# Patient Record
Sex: Male | Born: 1945 | Race: Black or African American | Hispanic: No | Smoking: Current some day smoker
Health system: Southern US, Community
[De-identification: ages and names within clinical notes are randomized; demographics above are authoritative.]

## PROBLEM LIST (undated history)

## (undated) DIAGNOSIS — I639 Cerebral infarction, unspecified: Secondary | ICD-10-CM

## (undated) DIAGNOSIS — N289 Disorder of kidney and ureter, unspecified: Secondary | ICD-10-CM

## (undated) DIAGNOSIS — E785 Hyperlipidemia, unspecified: Secondary | ICD-10-CM

## (undated) DIAGNOSIS — C3492 Malignant neoplasm of unspecified part of left bronchus or lung: Secondary | ICD-10-CM

## (undated) DIAGNOSIS — R011 Cardiac murmur, unspecified: Secondary | ICD-10-CM

## (undated) DIAGNOSIS — M109 Gout, unspecified: Secondary | ICD-10-CM

## (undated) DIAGNOSIS — C61 Malignant neoplasm of prostate: Secondary | ICD-10-CM

## (undated) DIAGNOSIS — F329 Major depressive disorder, single episode, unspecified: Secondary | ICD-10-CM

## (undated) DIAGNOSIS — N189 Chronic kidney disease, unspecified: Secondary | ICD-10-CM

## (undated) DIAGNOSIS — F32A Depression, unspecified: Secondary | ICD-10-CM

## (undated) DIAGNOSIS — E119 Type 2 diabetes mellitus without complications: Secondary | ICD-10-CM

## (undated) DIAGNOSIS — N19 Unspecified kidney failure: Secondary | ICD-10-CM

## (undated) DIAGNOSIS — H409 Unspecified glaucoma: Secondary | ICD-10-CM

## (undated) DIAGNOSIS — I10 Essential (primary) hypertension: Secondary | ICD-10-CM

## (undated) HISTORY — DX: Malignant neoplasm of unspecified part of left bronchus or lung: C34.92

## (undated) HISTORY — PX: CHOLECYSTECTOMY: SHX55

## (undated) HISTORY — PX: INSERTION PROSTATE RADIATION SEED: SUR718

## (undated) HISTORY — PX: APPENDECTOMY: SHX54

---

## 2004-04-02 ENCOUNTER — Ambulatory Visit: Payer: Self-pay | Admitting: Internal Medicine

## 2004-12-22 ENCOUNTER — Inpatient Hospital Stay: Payer: Self-pay

## 2006-03-17 ENCOUNTER — Emergency Department: Payer: Self-pay | Admitting: Emergency Medicine

## 2007-02-24 ENCOUNTER — Ambulatory Visit: Payer: Self-pay | Admitting: Nephrology

## 2007-03-08 ENCOUNTER — Inpatient Hospital Stay: Payer: Self-pay | Admitting: Internal Medicine

## 2007-03-08 ENCOUNTER — Other Ambulatory Visit: Payer: Self-pay

## 2007-10-12 ENCOUNTER — Emergency Department: Payer: Self-pay | Admitting: Internal Medicine

## 2008-10-12 ENCOUNTER — Emergency Department: Payer: Self-pay | Admitting: Emergency Medicine

## 2009-11-05 ENCOUNTER — Ambulatory Visit: Payer: Self-pay | Admitting: Specialist

## 2011-04-11 ENCOUNTER — Ambulatory Visit: Payer: Self-pay | Admitting: Urology

## 2011-04-16 ENCOUNTER — Ambulatory Visit: Payer: Self-pay | Admitting: Urology

## 2011-04-18 LAB — PATHOLOGY REPORT

## 2011-05-14 ENCOUNTER — Ambulatory Visit: Payer: Self-pay | Admitting: Urology

## 2011-06-26 ENCOUNTER — Ambulatory Visit: Payer: Self-pay | Admitting: Urology

## 2011-06-30 ENCOUNTER — Ambulatory Visit: Payer: Self-pay | Admitting: Radiation Oncology

## 2011-07-09 ENCOUNTER — Ambulatory Visit: Payer: Self-pay | Admitting: Radiation Oncology

## 2011-08-01 ENCOUNTER — Ambulatory Visit: Payer: Self-pay | Admitting: Radiation Oncology

## 2011-08-18 ENCOUNTER — Ambulatory Visit: Payer: Self-pay | Admitting: Radiation Oncology

## 2011-08-18 LAB — HEMOGLOBIN: HGB: 13.6 g/dL (ref 13.0–18.0)

## 2011-08-18 LAB — BASIC METABOLIC PANEL
Anion Gap: 10 (ref 7–16)
Calcium, Total: 9 mg/dL (ref 8.5–10.1)
Chloride: 108 mmol/L — ABNORMAL HIGH (ref 98–107)
Co2: 23 mmol/L (ref 21–32)
Creatinine: 2.1 mg/dL — ABNORMAL HIGH (ref 0.60–1.30)
EGFR (African American): 41 — ABNORMAL LOW
Osmolality: 287 (ref 275–301)
Potassium: 4.5 mmol/L (ref 3.5–5.1)
Sodium: 141 mmol/L (ref 136–145)

## 2011-08-27 ENCOUNTER — Ambulatory Visit: Payer: Self-pay | Admitting: Urology

## 2011-09-01 ENCOUNTER — Ambulatory Visit: Payer: Self-pay | Admitting: Radiation Oncology

## 2011-10-01 ENCOUNTER — Ambulatory Visit: Payer: Self-pay | Admitting: Radiation Oncology

## 2012-02-13 ENCOUNTER — Ambulatory Visit: Payer: Self-pay | Admitting: Radiation Oncology

## 2012-03-02 ENCOUNTER — Ambulatory Visit: Payer: Self-pay | Admitting: Radiation Oncology

## 2012-07-16 ENCOUNTER — Ambulatory Visit: Payer: Self-pay | Admitting: Radiation Oncology

## 2014-03-13 ENCOUNTER — Emergency Department: Payer: Self-pay | Admitting: Emergency Medicine

## 2014-06-09 ENCOUNTER — Ambulatory Visit: Payer: Self-pay | Admitting: Radiation Oncology

## 2014-07-14 ENCOUNTER — Ambulatory Visit: Payer: Self-pay | Admitting: Radiation Oncology

## 2014-08-01 ENCOUNTER — Ambulatory Visit
Admit: 2014-08-01 | Disposition: A | Discharge status: Home / Self Care | Payer: Self-pay | Attending: Radiation Oncology | Admitting: Radiation Oncology

## 2014-08-02 ENCOUNTER — Ambulatory Visit: Payer: Self-pay | Admitting: Radiation Oncology

## 2014-08-07 ENCOUNTER — Ambulatory Visit
Admit: 2014-08-07 | Disposition: A | Discharge status: Home / Self Care | Payer: Self-pay | Attending: Radiation Oncology | Admitting: Radiation Oncology

## 2014-09-01 ENCOUNTER — Ambulatory Visit
Admit: 2014-09-01 | Disposition: A | Discharge status: Home / Self Care | Payer: Self-pay | Attending: Radiation Oncology | Admitting: Radiation Oncology

## 2014-09-24 NOTE — H&P (Signed)
PATIENT NAME:  Jose [name], Jose [name] MR#:  [id number] DATE OF BIRTH:  July 27, 1945  DATE OF ADMISSION:  08/27/2011  HISTORY OF PRESENT ILLNESS: The patient is a 69 year old male with chronic renal failure who originally presented with a PSA of 35 in 2006. Multiple biopsies were performed, all negative for disease. Recently he had been seen by Dr. Bernardo Heater. 24 cores were obtained on 05/16/2011 showing right-sided Gleason adenocarcinoma, Gleason score of 7 (4 + 3). He underwent bone scan which showed no evidence to suggest metastatic disease. He has some urinary frequency and urgency. The patient and his wife are extremely limited in their transportation options. He was consulted in my office 07/09/2011 and the patient opted for I-125 interstitial implant. The patient is doing well today. He is seen for history and physical as well as volume study to determine seed placement using BrachyVision treatment planning to deliver 145 Gy to his prostatic volume.   PAST MEDICAL HISTORY:  1. Prostate cancer.  2. Mini stroke x2 with some speech issues involved.  3. Gout.  4. Sleep apnea/CPAP.  5. Chronic renal disease, stage III.  6. Transient ischemic attack. 7. Mild dementia.  8. Diabetes mellitus type 2.  9. Depression.  10. Glaucoma.  11. Chronic renal failure, as described above.  12. Hypertension.   FAMILY HISTORY: Positive for renal cell carcinoma, depression, cerebrovascular accident, hypertension, and hypercholesteremia.   SOCIAL HISTORY: Greater than 40 pack-year smoking history, previous history of EtOH abuse.   ALLERGIES:  1. Hydrochlorothiazide. 2. Amoxicillin causes swelling.  3. Penicillin causes swelling.  4. Ampicillin.   HOME MEDICATIONS:  1. Hydralazine 25 mg oral tablets 1 tablet orally four times daily. 2. Aspirin 325 mg oral tablet 1 tablet once a day. I asked the patient to discontinue this at least two weeks prior to procedure.  3. Clonidine 0.2 mg oral tablet 1 tablet orally three  times daily. 4. Allopurinol 100 mg oral tablet 1 tablet orally once a day in the morning.  5. Escitalopram 10 mg oral tablet 1 tablet orally every other day.  6. Glipizide 5 mg oral tablet 1 tablet orally once a day.  7. Amlodipine 5 mg oral tablet 1 tablet orally once a day.  8. Sodium polystyrene sulfonate 15 mg/60 mL one tablespoon orally once a day.  9. Ophthalmic drops 0.5% one drop to both eyes at bedtime.   REVIEW OF SYSTEMS: Unchanged from prior examination dated 07/09/2011.   PHYSICAL EXAMINATION:   VITAL SIGNS: Vital signs are stable. Temperature 98.3, pulse 60, respirations 20 per minute, blood pressure 170/76.   GENERAL: Well-developed, well-nourished male with slight speech impediment.   HEENT: Pupils are equal, round and reactive to light and accommodation. Extraocular movements intact. Disks not visualized.   NECK: Clear without evidence of cervical or supraclavicular adenopathy.   LUNGS: Clear to auscultation and percussion.   HEART: Regular rate and rhythm without murmur, rub, or thrill.   ABDOMEN: Benign with no organomegaly or masses noted.   RECTAL: Examination was not performed today.   LYMPH: No peripheral adenopathy or edema is identified.   NEURO: Cranial nerves II through XII are grossly intact. Motor, sensory, and deep tendon reflex levels are equal and symmetric in the upper and lower extremities.   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    PLAN: At this time, the patient will have volume study to determine  prostate volume size and to perform BrachyVision treatment planning to determine seed placement. The risks and benefits of seed implantation have been discussed with the patient and his wife and both seem to comprehend our treatment plan well. Consent was signed. The patient is scheduled in the next several weeks for his  implant. He is cleared at this point to go ahead with implant scheduling.   I would like to take this opportunity to thank you for allowing me to continue to participate in this patient's care.  ____________________________ Armstead Peaks, MD gsc:slb D: 07/31/2011 09:13:54 ET T: 07/31/2011 09:39:51 ET JOB#: [id number]  cc: Armstead Peaks, MD, <Dictator> Scott C. Bernardo Heater, MD Irven Easterly. Kary Kos, [place] MD ELECTRONICALLY SIGNED 08/08/2011 9:46

## 2014-09-24 NOTE — Consult Note (Signed)
Reason for Visit: This 69 year old Male patient presents to the clinic for initial evaluation of  Prostate cancer .   Referred by Dr. Bernardo Heater.  Diagnosis:   Chief Complaint/Diagnosis   69 year old male with clinical stage IIa (T1 C. N0 M0) Gleason 7 (4+3) adenocarcinoma prostate presenting with a PSA of 57   Pathology Report Pathology report reviewed    Imaging Report Bone scan reviewed    Referral Report Clinical notes reviewed    Planned Treatment Regimen I-125 interstitial implant    HPI   patient is a 69 year old male with chronic renal failure who was originally presented with a PSA back in 2006 of 35. Multiple biopsies were performed by urology all negative for disease. He recently was seen by Dr. Bernardo Heater and had 24 cores obtained on 05/16/11 showing right-sided Gleason adenocarcinoma of 7 (4+3). He underwent a bone scan which essentially unremarkable for metastatic disease. He has early symptoms as far as urinary frequency or urgency. Patient and his wife do not on the car making transportation extremely difficult for prolonged or protracted course of treatment. He is seen today for consideration of treatment.  Past Hx:    Prostate Cancer:    Mini Stroke X 2 with some speech issues afterwards:    Gout:    Sleep Apnea/CPAP:    Chronic kidney Disease Stage 3:    TIA:    Mild Dementia:    Diabetes Mellitus, Type II (NIDD):    Depression:    Glaucoma:    Renal Failure:    HTN:    Prostate Biopsy: Dec 2009   Kidney Biopsy x 2:   Past, Family and Social History:   Past Medical History positive    Cardiovascular hypertension    Respiratory Sleep apnea    Genitourinary Chronic renal disease stage III    Endocrine diabetes mellitus    Neurological/Psychiatric Alzheimer's; depression; TIA    Past Medical History Comments Arthritis, gout    Family History positive    Family History Comments Family history positive for renal cell carcinoma, depression,  CVA, hypertension and hypercholesterolemia    Social History positive    Social History Comments Greater than 40-pack-year smoking history, previous history of EtOH use   Allergies:   HCTZ: Chest Pain  Amoxicillin: Swelling  Penicillin: Swelling  Ampicillin: Unknown  Home Meds:  Home Medications: Medication Instructions Status  hydrALAZINE 25 mg oral tablet 1 tab(s) orally 4 times a day Active  aspirin 325 mg oral tablet 1 tab(s) orally once a day (in the morning) Active  clonidine 0.2 mg oral tablet 1 tab(s) orally 3 times a day Active  allopurinol 100 mg oral tablet 1 tab(s) orally once a day (in the morning) Active  escitalopram 10 mg oral tablet 1 tab(s) orally every other day Active  glipiZIDE 5 mg oral tablet 1 tab(s) orally once a day (in the morning) Active  amlodipine 5 mg oral tablet 1 tab(s) orally once a day (in the morning) Active  sodium polystyrene sulfonate 15 GM/60ML 1 Tbs. orally once a day (in the morning) Active   0.5% Opthalmic Drops 1 drop(s) to both eyes at bedtime Active   Review of Systems:   General negative    Performance Status (ECOG) 0    Skin negative    Breast negative    Ophthalmologic negative    ENMT negative    Respiratory and Thorax negative    Cardiovascular see HPI    Gastrointestinal negative    Genitourinary  see HPI    Musculoskeletal negative    Neurological see HPI    Psychiatric see HPI    Hematology/Lymphatics negative    Endocrine see HPI    Allergic/Immunologic negative   Nursing Notes:  Nursing Vital Signs and Chemo Nursing Nursing Notes: *CC Vital Signs Flowsheet:   06-Feb-13 11:28   Temp Temperature 98.3   Pulse Pulse 60   Respirations Respirations 20   SBP SBP 170   DBP DBP 76   Pain Scale (0-10)  1   Current Weight (kg) (kg) 85.3   Height (cm) centimeters 176.6   BSA (m2) 2   Physical Exam:  General/Skin/HEENT:   General normal    Skin normal    Eyes normal    ENMT normal    Head and  Neck normal    Additional PE Well-developed well-nourished male in NAD. Lungs are clear to A&P cardiac examination shows regular rate and rhythm. Abdomen is benign. On rectal exam rectal sphincter tone is good. Sulcus is preserved bilaterally. Right lateral lobe was somewhat firm although no discreet nodularity is appreciated. Seminal vesicle regions appear normal.   Breasts/Resp/CV/GI/GU:   Respiratory and Thorax normal    Cardiovascular normal    Gastrointestinal normal    Genitourinary normal   MS/Neuro/Psych/Lymph:   Musculoskeletal normal    Neurological normal    Lymphatics normal   Other Results:  Radiology Results: Nuclear Med:    12-Dec-12 14:08, Bone Scan Whole Body (Part 2 of 2)   Bone Scan Whole Body (Part 2 of 2)    REASON FOR EXAM:    prostate CA  COMMENTS:       PROCEDURE: KNM - KNM BONE WB 3HR 2 OF 2  - May 14 2011  2:08PM     RESULT:     Total Body Bone Scan was performed status post right antecubital   injection of 23.54 mCi of technetium 76mlabeled MDP.     FINDINGS: Appropriate bio-distribution is identified in the region of the   kidneys and urinary bladder. There is no evidence of abnormal foci of   osseous remodeling. Areas of osseous remodeling project in the region of   the acromioclavicular joints as well as along the superior aspect of the   lateral femoral condyle on the right likely representing areas of     degenerative change.    IMPRESSION:  Areas of osseous remodeling as described above likely   representing degenerative change without evidence of abnormal foci of   osseous remodeling.       Thank you for this opportunity to contribute to the care of your patient.           Verified By: HMikki Santee M.D., MD   Assessment and Plan:  Impression:   clinical stage IIa adenocarcinoma of the prostate Gleason score of 7 ([id number] in 69year old male with multiple comorbidities  Plan:   at this time I got over treatment options  with the patient including radical prostatectomy as well as external beam radiation and prostate seed implant. Based on his high PSA and Gleason score do not think radical prostatectomy would certainly him well. External beam radiation therapy also be of prolonged course of therapy with the patient with major transportation issues. Based on this I have recommended I-125 interstitial implant to deliver 145 gray to his prostatic volume. Risks and benefits of treatment including possible diarrhea dysuria or his discussed with the patient and his wife. They have agreed to go  ahead with prostate implant. We will be scheduling volume study in the next several weeks.  I would like to take this opportunity to thank you for allowing me to continue to participate in this patient's care.  CC Referral:   cc: Dr. Bernardo Heater, Dr. [name]   Electronic Signatures: Baruch Gouty, Roda Shutters (MD)  (Signed 06-Feb-13 13:44)  Authored: HPI, Diagnosis, Past Hx, PFSH, Allergies, Home Meds, ROS, Nursing Notes, Physical Exam, Other Results, Encounter Assessment and Plan, CC Referring Physician   Last Updated: 06-Feb-13 13:44 by Armstead Peaks (MD)

## 2014-09-24 NOTE — Op Note (Signed)
PATIENT NAME:  Jose [name], BAHL MR#:  [id number] DATE OF BIRTH:  26-Nov-1945  DATE OF PROCEDURE:  07/30/2011  PREOPERATIVE DIAGNOSIS: Adenocarcinoma of the prostate.   POSTOPERATIVE DIAGNOSIS: Adenocarcinoma of the prostate.   SURGEON: Scott C. Stoioff, MD   INDICATIONS FOR PROCEDURE: The patient is a 69 year old male recently diagnosed with moderate risk adenocarcinoma of the prostate. After discussion of treatment options, he has elected brachytherapy and presents for a transrectal ultrasound of the prostate for brachytherapy planning.   DESCRIPTION OF PROCEDURE: The patient was transported from the [place] to the cystoscopy suite. He was placed in the low lithotomy position. A Foley catheter was placed to cavity drainage. A transrectal ultrasound probe was lubricated and passed per rectum. The probe was then placed into the stepping unit and the prostatic image was positioned. An incremental volume study was then performed from the base to apex. At the completion of the procedure,  the ultrasound probe and Foley catheter were removed. The patient was transported back to the [place]. There were no complications. Estimated blood loss was zero.  ____________________________ Ronda Fairly Bernardo Heater, MD scs:cbb D: 07/30/2011 16:59:16 ET T: 07/30/2011 17:26:52 ET JOB#: [id number]  cc: Nicki Reaper C. Bernardo Heater, MD, <Dictator> Abbie Sons MD ELECTRONICALLY SIGNED 08/11/2011 18:15

## 2014-09-24 NOTE — Op Note (Signed)
PATIENT NAME:  Jose [name], Jose [name] MR#:  [id number] DATE OF BIRTH:  26-Aug-1945  DATE OF PROCEDURE:  08/27/2011  PREOPERATIVE DIAGNOSIS: Clinical stage T1C adenocarcinoma of the prostate.   POSTOPERATIVE DIAGNOSIS: Clinical stage T1C adenocarcinoma of the prostate.   PROCEDURES:  1. I-125 transperineal interstitial implant.  2. Cystoscopy.   SURGEON: Scott C. Bernardo Heater, MD  RADIATION ONCOLOGIST: Dr. Baruch Gouty  ANESTHESIA: General.   INDICATIONS: 69 year old male with a history of an elevated PSA up to 35 in 2006. He had had multiple negative biopsies by Dr. Yves Dill. PSA had risen to 40s and saturation biopsies were performed in 05/2011 with one core returning Gleason 4 + 3 adenocarcinoma of the prostate. Metastatic evaluation has been otherwise negative. After discussion of treatment options, he has elected prostate brachytherapy. He has previously undergone an ultrasound volume study for brachytherapy planning.   DESCRIPTION OF PROCEDURE: Patient was taken to the Operating Room where a general anesthetic was administered. He was placed in the lithotomy position with positioning to match that obtained during the preplanning study. His perineum and external genitalia were prepped and draped sterilely. A 16 French Foley catheter was placed sterilely on the operative field. A 7.5 MHz transrectal ultrasound probe was inserted per rectum and placed into the stepping unit. Prostatic image was positioned over the grid to match that obtained during the volume study. Perineal template was then placed into the stepping unit. Preloaded needles were then placed through the template to the coordinates as determined by the preplanning study. The needles were placed under ultrasound guidance with position confirmed in both transverse and sagittal position. The needles were passed under ultrasound guidance by myself and the seeds were dropped by Dr. Baruch Gouty. A total of 18 needles were placed with a total number of 82  seeds. At completion of the procedure, no bleeding was noted. Foley catheter was removed. A 21 French cystoscope was passed under direct vision. There was no significant bleeding of the prostate noted. No seeds were noted within the prostatic urethra or urethra. Bladder was closely inspected and no seeds were noted in the bladder. The cystoscope was removed. Foley catheter was replaced. A postoperative radiograph showed all seeds accounted for. A B and O suppository was placed per rectum. Patient taken to PAC-U in stable condition. There were no complications. EBL was minimal.   ____________________________ Ronda Fairly. Bernardo Heater, MD scs:cms D: 08/27/2011 11:15:00 ET T: 08/27/2011 11:35:44 ET JOB#: [id number]  cc: Nicki Reaper C. Bernardo Heater, MD, <Dictator> Abbie Sons MD ELECTRONICALLY SIGNED 09/03/2011 11:57

## 2014-10-01 NOTE — Consult Note (Signed)
Reason for Visit: This 69 year old Male patient presents to the clinic for initial evaluation of  recurrent prostate cancer .   Referred by Dr. Erlene Quan.  Diagnosis:  Chief Complaint/Diagnosis   69 year old male with initial stage IIB (T1 cN0 M0) Gleason 7 (4+3) adenocarcinoma prostate presenting with a PSA of 35 now with elevated PSA.  Imaging Report bone scan and CT scan of pelvis with contrast have been ordered contrast may be limited based on his renal function   Referral Report clinical notes reviewed   Planned Treatment Regimen LHRH agonist plus possible salvage radiation   HPI   patient is a 70 year old male now out over 3 years have included I-125 interstitial implant for Gleason 7 (4+3 adenocarcinoma prostate presenting with initial PSA of 35. Bone scan at that time was negative. He has chronic renal disease and at that time had extremely limited transportation options and we opted to do I-125 interstitial implant delivering 145 gray to his prostatic volume.there was some decreased coverage at the bladder base although we opted to observe the patient and he has had limited follow-up with me in the past. his PSA never reached a nadir count most recently in September 2015 it was 72. He specifically denies diarrhea dysuria or any other GI/GU complaints. He does have erectile dysfunction. I been asked to evaluate the patient for possible salvage treatment. He has had no further workup at this time.  Past Hx:    Prostate Cancer:    Mini Stroke X 2 with some speech issues afterwards:    Gout:    Sleep Apnea/CPAP:    Chronic kidney Disease Stage 3:    TIA:    Mild Dementia:    Diabetes Mellitus, Type II (NIDD):    Depression:    Glaucoma:    Renal Failure:    HTN:    Prostate I-125 Seed Implants:    Prostate Biopsy: Dec 2009   Kidney Biopsy x 2:   Past, Family and Social History:  Past Medical History positive   Cardiovascular hypertension   Respiratory sleep  apnea   Genitourinary chronic renal diseaseand stage III,renal failure   Endocrine diabetes mellitus   Neurological/Psychiatric Alzheimer's; depression; TIA   Past Medical History Comments glaucoma   Family History noncontributory   Social History positive   Social History Comments Santiago Glad 60-pack-year smoking history continues to smoke less than a pack a day EtOH abuse history although quit in 2001   Additional Past Medical and Surgical History accompanied by his wife today   Allergies:   HCTZ: Chest Pain  Amoxicillin: Swelling  Penicillin: Swelling  Ampicillin: Swelling  Home Meds:  Home Medications: Medication Instructions Status  hydrALAZINE 25 mg oral tablet 1 tab(s) orally 4 times a day Active  aspirin 325 mg oral tablet 1 tab(s) orally once a day (in the morning) Active  clonidine 0.2 mg oral tablet 1 tab(s) orally 3 times a day Active  allopurinol 100 mg oral tablet 1 tab(s) orally once a day (in the morning) Active  escitalopram 10 mg oral tablet 1 tab(s) orally every other day Active  amlodipine 5 mg oral tablet 1 tab(s) orally once a day (in the morning) Active  sodium polystyrene sulfonate 15 GM/60ML 1 Tbs. orally once a day (in the morning) Active  Trimolol Opthalmic 0.5% 1 drop(s) to each affected eye once a day (at bedtime) Active   Review of Systems:  General negative   Performance Status (ECOG) 0   Skin negative  Breast negative   Ophthalmologic negative   ENMT negative   Respiratory and Thorax negative   Cardiovascular negative   Gastrointestinal negative   Genitourinary see HPI   Musculoskeletal negative   Neurological negative   Psychiatric negative   Hematology/Lymphatics negative   Endocrine negative   Allergic/Immunologic negative   Review of Systems   denies any weight loss, fatigue, weakness, fever, chills or night sweats. Patient denies any loss of vision, blurred vision. Patient denies any ringing  of the ears or hearing  loss. No irregular heartbeat. Patient denies heart murmur or history of fainting. Patient denies any chest pain or pain radiating to her upper extremities. Patient denies any shortness of breath, difficulty breathing at night, cough or hemoptysis. Patient denies any swelling in the lower legs. Patient denies any nausea vomiting, vomiting of blood, or coffee ground material in the vomitus. Patient denies any stomach pain. Patient states has had normal bowel movements no significant constipation or diarrhea. Patient denies any dysuria, hematuria or significant nocturia. Patient denies any problems walking, swelling in the joints or loss of balance. Patient denies any skin changes, loss of hair or loss of weight. Patient denies any excessive worrying or anxiety or significant depression. Patient denies any problems with insomnia. Patient denies excessive thirst, polyuria, polydipsia. Patient denies any swollen glands, patient denies easy bruising or easy bleeding. Patient denies any recent infections, allergies or URI. Patient "s visual fields have not changed significantly in recent time.   Nursing Notes:  Nursing Vital Signs and Chemo Nursing Nursing Notes: *CC Vital Signs Flowsheet:   12-Feb-16 10:26  Temp Temperature 95.6  Pulse Pulse 82  Respirations Respirations 18  SBP SBP 133  DBP DBP 73  Pain Scale (0-10)  0  Current Weight (kg) (kg) 86.8   Physical Exam:  General/Skin/HEENT:  General normal   Skin normal   Eyes normal   ENMT normal   Head and Neck normal   Additional PE ell-developed male in NAD lungs are clear to A&P chronic examination shows regular rate and rhythm abdomen is benign. On rectal exam rectal sphincter tone is good prostate is approximately 30 g no evidence of mass or nodularity is appreciated sulcus is preserved bilaterally.   Breasts/Resp/CV/GI/GU:  Respiratory and Thorax normal   Cardiovascular normal   Gastrointestinal normal   Genitourinary normal    MS/Neuro/Psych/Lymph:  Musculoskeletal normal   Neurological normal   Lymphatics normal   Relevent Results:   Relevant Scans and Labs CT scan and bone scan ordered   Assessment and Plan: Impression:   recurrent adenocarcinoma the prostate in patient status post seed implantation greater than 3 years prior Plan:   at this time to complete his staging workup on the patient and perform CT scan of the pelvis to detect possibility of extracapsular spread versus pelvic lymphadenopathy. Also have ordered a bone scan for staging. She does be negative I believe will be reasonable to perform IM RT salvage radiation therapy at the 5000 cGy to his prostate. I also believe he should be started on Lupron and have ordered a 4 month Depo injection for the patient.Risks and benefits of salvage radiation therapy were explained to the patient and his wife. Side effects such as increased urgency and lower urinary tract symptoms, diarrhea, fatigue, alteration of blood counts, and post treatment of tissue that's already been radiated by seed implantation or works planed in detail to the patient. He seems to comprehend my treatment plan well. I have set him up  for a follow-up appointment right after his studies are complete.  I would like to take this opportunity for allowing me to participate in the care of your patient..   Fax to Physician:  Phys[facility] To [facility] Fax: Sherlynn Stalls, MD - [phone] Jarome Lamas, MD - [phone].  Electronic Signatures: Armstead Peaks (MD)  (Signed 12-Feb-16 11:48)  Authored: HPI, Diagnosis, Past Hx, PFSH, Allergies, Home Meds, ROS, Nursing Notes, Physical Exam, Relevent Results, Encounter Assessment and Plan, Fax to Physician   Last Updated: 12-Feb-16 11:48 by Armstead Peaks (MD)

## 2015-02-07 ENCOUNTER — Encounter: Payer: Self-pay | Admitting: Radiation Oncology

## 2015-02-07 ENCOUNTER — Inpatient Hospital Stay: Payer: Medicare Other | Attending: Radiation Oncology

## 2015-02-07 ENCOUNTER — Other Ambulatory Visit: Payer: Self-pay | Admitting: *Deleted

## 2015-02-07 ENCOUNTER — Ambulatory Visit
Admission: RE | Admit: 2015-02-07 | Discharge: 2015-02-07 | Disposition: A | Discharge status: Home / Self Care | Payer: Medicare Other | Source: Ambulatory Visit | Attending: Radiation Oncology | Admitting: Radiation Oncology

## 2015-02-07 VITALS — BP 155/86 | HR 76 | Temp 96.8°F | Resp 20 | Ht 73.0 in | Wt 183.8 lb

## 2015-02-07 DIAGNOSIS — C61 Malignant neoplasm of prostate: Secondary | ICD-10-CM

## 2015-02-07 HISTORY — DX: Essential (primary) hypertension: I10

## 2015-02-07 HISTORY — DX: Malignant neoplasm of prostate: C61

## 2015-02-07 HISTORY — DX: Type 2 diabetes mellitus without complications: E11.9

## 2015-02-07 LAB — PSA: PSA: 5.29 ng/mL — AB (ref 0.00–4.00)

## 2015-02-07 NOTE — Progress Notes (Signed)
Radiation Oncology Follow up Note  Name: Jose [name]   Date:   02/07/2015 MRN:  [id number] DOB: Apr 23, 1946    This 69 y.o. male presents to the clinic today for follow-up for salvage radiation therapy for prostate cancer.  REFERRING PROVIDER: No ref. 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 He initially had back in 2012 I-125 interstitial implant. He had biochemical failure and earlier this year we did salvage I am RT radiation therapy and he has done well. He specifically denies diarrhea dysuria or any other GI/GU complaints.. He is seen today and is doing well specifically denies diarrhea dysuria or any other GI/GU complaints. I have run a PSA level on him today  COMPLICATIONS OF TREATMENT: none  FOLLOW UP COMPLIANCE: keeps appointments   PHYSICAL EXAM:  BP 155/86 mmHg  Pulse 76  Temp(Src) 96.8 F (36 C)  Resp 20  Ht '6\' 1"'$  (1.854 m)  Wt 183 lb 12.1 oz (83.35 kg)  BMI 24.25 kg/m2 On rectal exam rectal sphincter tone is good prostate is markedly's shrunk in size with no discrete nodularity or mass.  Well-developed well-nourished patient in NAD. HEENT reveals PERLA, EOMI, discs not visualized.  Oral cavity is clear. No oral mucosal lesions are identified. Neck is clear without evidence of cervical or supraclavicular adenopathy. Lungs are clear to A&P. Cardiac examination is essentially unremarkable with regular rate and rhythm without murmur rub or thrill. Abdomen is benign with no organomegaly or masses noted. Motor sensory and DTR levels are equal and symmetric in the upper and lower extremities. Cranial nerves II through XII are grossly intact. Proprioception is intact. No peripheral adenopathy or edema is identified. No motor or sensory levels are noted. Crude visual fields are within normal range.   RADIOLOGY RESULTS: No current films for review  PLAN:  At this time I have run a PSA level on on him and will report that separately. Otherwise I'm please was overall progress. May continue on Lupron therapy intermittently should his PSA continue to be elevated. Also may refer him to medical oncology should we see continued Kleiman his PSA. Otherwise I've asked to see him back in 6 months for follow-up. Will obtain another PSA at that time for a trend.  I would like to take this opportunity for allowing me to participate in the care of your patient.Armstead Peaks., MD

## 2015-08-01 ENCOUNTER — Other Ambulatory Visit: Payer: Self-pay | Admitting: *Deleted

## 2015-08-06 ENCOUNTER — Ambulatory Visit: Payer: Medicare Other | Attending: Radiation Oncology | Admitting: Radiation Oncology

## 2015-08-06 ENCOUNTER — Other Ambulatory Visit: Payer: Self-pay | Admitting: *Deleted

## 2015-08-06 ENCOUNTER — Inpatient Hospital Stay: Payer: Medicare Other | Attending: Radiation Oncology

## 2017-04-20 ENCOUNTER — Other Ambulatory Visit (INDEPENDENT_AMBULATORY_CARE_PROVIDER_SITE_OTHER): Payer: Self-pay | Admitting: Vascular Surgery

## 2017-04-20 DIAGNOSIS — Z992 Dependence on renal dialysis: Principal | ICD-10-CM

## 2017-04-20 DIAGNOSIS — N186 End stage renal disease: Secondary | ICD-10-CM

## 2017-04-27 ENCOUNTER — Encounter (INDEPENDENT_AMBULATORY_CARE_PROVIDER_SITE_OTHER): Payer: Self-pay | Admitting: Vascular Surgery

## 2017-04-27 ENCOUNTER — Encounter (INDEPENDENT_AMBULATORY_CARE_PROVIDER_SITE_OTHER): Payer: Self-pay

## 2018-03-21 ENCOUNTER — Inpatient Hospital Stay: Payer: Medicare Other

## 2018-03-21 ENCOUNTER — Other Ambulatory Visit: Payer: Self-pay

## 2018-03-21 ENCOUNTER — Inpatient Hospital Stay
Admission: EM | Admit: 2018-03-21 | Discharge: 2018-03-27 | DRG: 673 | Disposition: A | Discharge status: Home / Self Care | Payer: Medicare Other | Attending: Internal Medicine | Admitting: Internal Medicine

## 2018-03-21 ENCOUNTER — Emergency Department: Payer: Medicare Other

## 2018-03-21 DIAGNOSIS — E872 Acidosis: Secondary | ICD-10-CM | POA: Diagnosis present

## 2018-03-21 DIAGNOSIS — Z880 Allergy status to penicillin: Secondary | ICD-10-CM | POA: Diagnosis not present

## 2018-03-21 DIAGNOSIS — N186 End stage renal disease: Secondary | ICD-10-CM | POA: Diagnosis not present

## 2018-03-21 DIAGNOSIS — N185 Chronic kidney disease, stage 5: Secondary | ICD-10-CM | POA: Diagnosis present

## 2018-03-21 DIAGNOSIS — E440 Moderate protein-calorie malnutrition: Secondary | ICD-10-CM | POA: Diagnosis present

## 2018-03-21 DIAGNOSIS — I120 Hypertensive chronic kidney disease with stage 5 chronic kidney disease or end stage renal disease: Secondary | ICD-10-CM | POA: Diagnosis present

## 2018-03-21 DIAGNOSIS — F17200 Nicotine dependence, unspecified, uncomplicated: Secondary | ICD-10-CM | POA: Diagnosis present

## 2018-03-21 DIAGNOSIS — E8770 Fluid overload, unspecified: Secondary | ICD-10-CM | POA: Diagnosis present

## 2018-03-21 DIAGNOSIS — Z79899 Other long term (current) drug therapy: Secondary | ICD-10-CM | POA: Diagnosis not present

## 2018-03-21 DIAGNOSIS — C61 Malignant neoplasm of prostate: Secondary | ICD-10-CM | POA: Diagnosis present

## 2018-03-21 DIAGNOSIS — D631 Anemia in chronic kidney disease: Secondary | ICD-10-CM | POA: Diagnosis present

## 2018-03-21 DIAGNOSIS — N2581 Secondary hyperparathyroidism of renal origin: Secondary | ICD-10-CM | POA: Diagnosis present

## 2018-03-21 DIAGNOSIS — R0602 Shortness of breath: Secondary | ICD-10-CM

## 2018-03-21 DIAGNOSIS — N189 Chronic kidney disease, unspecified: Secondary | ICD-10-CM

## 2018-03-21 DIAGNOSIS — M109 Gout, unspecified: Secondary | ICD-10-CM | POA: Diagnosis present

## 2018-03-21 DIAGNOSIS — E1122 Type 2 diabetes mellitus with diabetic chronic kidney disease: Secondary | ICD-10-CM | POA: Diagnosis present

## 2018-03-21 DIAGNOSIS — E8809 Other disorders of plasma-protein metabolism, not elsewhere classified: Secondary | ICD-10-CM | POA: Diagnosis not present

## 2018-03-21 DIAGNOSIS — J810 Acute pulmonary edema: Secondary | ICD-10-CM | POA: Diagnosis present

## 2018-03-21 DIAGNOSIS — Z681 Body mass index (BMI) 19 or less, adult: Secondary | ICD-10-CM

## 2018-03-21 DIAGNOSIS — Z7982 Long term (current) use of aspirin: Secondary | ICD-10-CM

## 2018-03-21 DIAGNOSIS — E785 Hyperlipidemia, unspecified: Secondary | ICD-10-CM | POA: Diagnosis present

## 2018-03-21 DIAGNOSIS — N179 Acute kidney failure, unspecified: Principal | ICD-10-CM

## 2018-03-21 DIAGNOSIS — I440 Atrioventricular block, first degree: Secondary | ICD-10-CM | POA: Diagnosis present

## 2018-03-21 DIAGNOSIS — E1121 Type 2 diabetes mellitus with diabetic nephropathy: Secondary | ICD-10-CM | POA: Diagnosis present

## 2018-03-21 DIAGNOSIS — N184 Chronic kidney disease, stage 4 (severe): Secondary | ICD-10-CM

## 2018-03-21 DIAGNOSIS — I5030 Unspecified diastolic (congestive) heart failure: Secondary | ICD-10-CM | POA: Diagnosis not present

## 2018-03-21 DIAGNOSIS — N19 Unspecified kidney failure: Secondary | ICD-10-CM | POA: Diagnosis present

## 2018-03-21 LAB — T4, FREE: FREE T4: 1 ng/dL (ref 0.82–1.77)

## 2018-03-21 LAB — BASIC METABOLIC PANEL
Anion gap: 10 (ref 5–15)
BUN: 76 mg/dL — ABNORMAL HIGH (ref 8–23)
CALCIUM: 9 mg/dL (ref 8.9–10.3)
CO2: 14 mmol/L — ABNORMAL LOW (ref 22–32)
Chloride: 115 mmol/L — ABNORMAL HIGH (ref 98–111)
Creatinine, Ser: 7.36 mg/dL — ABNORMAL HIGH (ref 0.61–1.24)
GFR calc Af Amer: 8 mL/min — ABNORMAL LOW (ref >60)
GFR, EST NON AFRICAN AMERICAN: 7 mL/min — AB (ref >60)
Glucose, Bld: 189 mg/dL — ABNORMAL HIGH (ref 70–99)
Potassium: 4.8 mmol/L (ref 3.5–5.1)
SODIUM: 139 mmol/L (ref 135–145)

## 2018-03-21 LAB — CBC
HCT: 30.3 % — ABNORMAL LOW (ref 39.0–52.0)
Hemoglobin: 9.6 g/dL — ABNORMAL LOW (ref 13.0–17.0)
MCH: 30.4 pg (ref 26.0–34.0)
MCHC: 31.7 g/dL (ref 30.0–36.0)
MCV: 95.9 fL (ref 80.0–100.0)
NRBC: 0 % (ref 0.0–0.2)
PLATELETS: 227 10*3/uL (ref 150–400)
RBC: 3.16 MIL/uL — AB (ref 4.22–5.81)
RDW: 15.9 % — ABNORMAL HIGH (ref 11.5–15.5)
WBC: 9.4 10*3/uL (ref 4.0–10.5)

## 2018-03-21 LAB — TROPONIN I

## 2018-03-21 LAB — PHOSPHORUS: Phosphorus: 3.9 mg/dL (ref 2.5–4.6)

## 2018-03-21 LAB — GLUCOSE, CAPILLARY
Glucose-Capillary: 104 mg/dL — ABNORMAL HIGH (ref 70–99)
Glucose-Capillary: 116 mg/dL — ABNORMAL HIGH (ref 70–99)

## 2018-03-21 LAB — BRAIN NATRIURETIC PEPTIDE: B Natriuretic Peptide: 2596 pg/mL — ABNORMAL HIGH (ref 0.0–100.0)

## 2018-03-21 LAB — TSH: TSH: 6.748 u[IU]/mL — ABNORMAL HIGH (ref 0.350–4.500)

## 2018-03-21 MED ORDER — ACETAMINOPHEN 650 MG RE SUPP: 650.0000 mg | Freq: Four times a day (QID) | RECTAL | Status: DC | PRN

## 2018-03-21 MED ORDER — ONDANSETRON HCL 4 MG/2ML IJ SOLN: 4.0000 mg | Freq: Four times a day (QID) | INTRAMUSCULAR | Status: DC | PRN

## 2018-03-21 MED ORDER — SODIUM CHLORIDE 0.9% FLUSH: 3.0000 mL | INTRAVENOUS | Status: DC | PRN

## 2018-03-21 MED ORDER — INSULIN ASPART 100 UNIT/ML ~~LOC~~ SOLN
0.0000 [IU] | Freq: Three times a day (TID) | SUBCUTANEOUS | Status: DC
  Administered 2018-03-22 – 2018-03-23 (×2): 2 [IU] via SUBCUTANEOUS
  Administered 2018-03-25 – 2018-03-26 (×2): 3 [IU] via SUBCUTANEOUS
  Administered 2018-03-26: 1 [IU] via SUBCUTANEOUS
  Filled 2018-03-21 (×5): qty 1

## 2018-03-21 MED ORDER — AMLODIPINE BESYLATE 10 MG PO TABS
10.0000 mg | ORAL_TABLET | Freq: Every day | ORAL | Status: DC
  Administered 2018-03-22 – 2018-03-27 (×6): 10 mg via ORAL
  Filled 2018-03-21 (×3): qty 2
  Filled 2018-03-21 (×3): qty 1

## 2018-03-21 MED ORDER — HYDRALAZINE HCL 50 MG PO TABS
25.0000 mg | ORAL_TABLET | Freq: Three times a day (TID) | ORAL | Status: DC
  Administered 2018-03-21 – 2018-03-27 (×17): 25 mg via ORAL
  Filled 2018-03-21 (×18): qty 1

## 2018-03-21 MED ORDER — HEPARIN SODIUM (PORCINE) 5000 UNIT/ML IJ SOLN
5000.0000 [IU] | Freq: Three times a day (TID) | INTRAMUSCULAR | Status: DC
  Administered 2018-03-21 – 2018-03-27 (×16): 5000 [IU] via SUBCUTANEOUS
  Filled 2018-03-21 (×18): qty 1

## 2018-03-21 MED ORDER — ONDANSETRON HCL 4 MG PO TABS: 4.0000 mg | ORAL_TABLET | Freq: Four times a day (QID) | ORAL | Status: DC | PRN

## 2018-03-21 MED ORDER — ASPIRIN EC 325 MG PO TBEC
325.0000 mg | DELAYED_RELEASE_TABLET | Freq: Every day | ORAL | Status: DC
  Administered 2018-03-21 – 2018-03-27 (×6): 325 mg via ORAL
  Filled 2018-03-21 (×6): qty 1

## 2018-03-21 MED ORDER — SODIUM CHLORIDE 0.9 % IV SOLN: 250.0000 mL | INTRAVENOUS | Status: DC | PRN

## 2018-03-21 MED ORDER — TRAMADOL HCL 50 MG PO TABS: 50.0000 mg | ORAL_TABLET | Freq: Two times a day (BID) | ORAL | Status: DC | PRN

## 2018-03-21 MED ORDER — ESCITALOPRAM OXALATE 10 MG PO TABS
10.0000 mg | ORAL_TABLET | Freq: Every day | ORAL | Status: DC
  Administered 2018-03-21 – 2018-03-26 (×6): 10 mg via ORAL
  Filled 2018-03-21 (×8): qty 1

## 2018-03-21 MED ORDER — ACETAMINOPHEN 325 MG PO TABS: 650.0000 mg | ORAL_TABLET | Freq: Four times a day (QID) | ORAL | Status: DC | PRN

## 2018-03-21 MED ORDER — FUROSEMIDE 10 MG/ML IJ SOLN
4.0000 mg/h | INTRAVENOUS | Status: DC
  Administered 2018-03-21 – 2018-03-22 (×2): 4 mg/h via INTRAVENOUS
  Filled 2018-03-21 (×3): qty 25

## 2018-03-21 MED ORDER — GLIPIZIDE 5 MG PO TABS
5.0000 mg | ORAL_TABLET | Freq: Every day | ORAL | Status: DC
  Administered 2018-03-22: 10:00:00 5 mg via ORAL
  Filled 2018-03-21: qty 1

## 2018-03-21 MED ORDER — SODIUM CHLORIDE 0.9% FLUSH
3.0000 mL | Freq: Two times a day (BID) | INTRAVENOUS | Status: DC
  Administered 2018-03-21 – 2018-03-27 (×7): 3 mL via INTRAVENOUS

## 2018-03-21 MED ORDER — SENNOSIDES-DOCUSATE SODIUM 8.6-50 MG PO TABS: 1.0000 | ORAL_TABLET | Freq: Every evening | ORAL | Status: DC | PRN

## 2018-03-21 MED ORDER — CLONIDINE HCL 0.1 MG PO TABS
0.2000 mg | ORAL_TABLET | Freq: Two times a day (BID) | ORAL | Status: DC
  Administered 2018-03-21 – 2018-03-27 (×12): 0.2 mg via ORAL
  Filled 2018-03-21 (×13): qty 2

## 2018-03-21 MED ORDER — INSULIN ASPART 100 UNIT/ML ~~LOC~~ SOLN: 0.0000 [IU] | Freq: Every day | SUBCUTANEOUS | Status: DC

## 2018-03-21 NOTE — Progress Notes (Signed)
MEDICATION RELATED CONSULT NOTE - INITIAL   Pharmacy Consult for Furosemide Drip Indication: fluid overload, acute on chronic renal failure  Allergies  Allergen Reactions  . Penicillin G     Other reaction(s): Unknown    Patient Measurements: Height: 6\' 1"  (185.4 cm) Weight: 157 lb (71.2 kg) IBW/kg (Calculated) : 79.9 Adjusted Body Weight:    Vital Signs: Temp: 97.9 F (36.6 C) (10/20 1041) Temp Source: Oral (10/20 1041) BP: 135/83 (10/20 1234) Pulse Rate: 71 (10/20 1232) Intake/Output from previous day: No intake/output data recorded. Intake/Output from this shift: No intake/output data recorded.  Labs: BMP Latest Ref Rng & Units 03/21/2018 08/18/2011 06/26/2011  Glucose 70 - 99 mg/dL 189(H) 119(H) -  BUN 8 - 23 mg/dL 76(H) 25(H) -  Creatinine 0.61 - 1.24 mg/dL 7.36(H) 2.10(H) 2.30(H)  Sodium 135 - 145 mmol/L 139 141 -  Potassium 3.5 - 5.1 mmol/L 4.8 4.5 -  Chloride 98 - 111 mmol/L 115(H) 108(H) -  CO2 22 - 32 mmol/L 14(L) 23 -  Calcium 8.9 - 10.3 mg/dL 9.0 9.0 -    Estimated Creatinine Clearance: 9.1 mL/min (A) (by C-G formula based on SCr of 7.36 mg/dL (H)).  Medical History: Past Medical History:  Diagnosis Date  . Diabetes mellitus without complication ([place])   . Hypertension   . Prostate cancer [facility])    Assessment: Patient admitted for fluid overload and acute on chronic renal failure. Pharmacy consulted to begin Furosemide drip.  Plan:  Will order Furosemide drip 4mg /hr to begin. Follow daily labs.  Paulina Fusi, PharmD, BCPS 03/21/2018 2:23 PM

## 2018-03-21 NOTE — Progress Notes (Signed)
Advanced care plan.  Purpose of the Encounter: CODE STATUS  Parties in Attendance: Patient  Patient's Decision Capacity: Good  Subjective/Patient's story: Presented to emergency room for shortness of breath and swelling in the legs   Objective/Medical story Patient has fluid overload, pulmonary edema and worsening renal function Needs evaluation by nephrology and probably will need IV Lasix drip for diuresis   Goals of care determination:  Advance care directives goals of care discussed with the patient Treatment plan discussed Patient wants everything done which includes CPR, intubation ventilator the need arises   CODE STATUS: Full resuscitation and full code   Time spent discussing advanced care planning: 16 minutes

## 2018-03-21 NOTE — ED Provider Notes (Signed)
[facility] Emergency Department Provider Note       Time seen: ----------------------------------------- 11:01 AM on 03/21/2018 -----------------------------------------   I have reviewed the triage vital signs and the nursing notes.  HISTORY   Chief Complaint Shortness of Breath    HPI Jose [name] is a 71 y.o. male with a history of diabetes, hypertension, prostate cancer, hyperlipidemia who presents to the ED for shortness of breath, dyspnea on exertion and orthopnea.  Patient states he cannot breathe and has been that way for the past 2 weeks.  Patient felt like he had a cold and he has had some productive cough with phlegm.  He denies fevers or chills, denies any chest pain.  Past Medical History:  Diagnosis Date  . Diabetes mellitus without complication ([place])   . Hypertension   . Prostate cancer ([place])     There are no active problems to display for this patient.   Past Surgical History:  Procedure Laterality Date  . APPENDECTOMY    . CHOLECYSTECTOMY      Allergies Penicillin g  Social History Social History   Tobacco Use  . Smoking status: Current Every Day Smoker  . Smokeless tobacco: Never Used  Substance Use Topics  . Alcohol use: Not on file  . Drug use: Not on file   Review of Systems Constitutional: Negative for fever. Cardiovascular: Negative for chest pain. Respiratory: Positive for shortness of breath, cough, sputum production Gastrointestinal: Negative for abdominal pain, vomiting and diarrhea. Musculoskeletal: Negative for back pain. Skin: Negative for rash. Neurological: Negative for headaches, focal weakness or numbness.  All systems negative/normal/unremarkable except as stated in the HPI  ____________________________________________   PHYSICAL EXAM:  VITAL SIGNS: ED Triage Vitals [03/21/18 1041]  Enc Vitals Group     BP (!) 154/60     Pulse Rate (!) 35     Resp 16     Temp 97.9 F (36.6 C)   Temp Source Oral     SpO2 97 %     Weight 157 lb (71.2 kg)     Height 6\' 1"  (1.854 m)     Head Circumference      Peak Flow      Pain Score 0     Pain Loc      Pain Edu?      Excl. in [place]?    Constitutional: Alert and oriented.  Chronically ill-appearing Eyes: Conjunctivae are normal.  Bilateral proptosis ENT   Head: Normocephalic and atraumatic.   Nose: No congestion/rhinnorhea.   Mouth/Throat: Mucous membranes are moist.   Neck: No stridor. Cardiovascular: Normal rate, irregular rhythm. No murmurs, rubs, or gallops. Respiratory: Tachypnea with rales bilaterally Gastrointestinal: Soft and nontender. Normal bowel sounds Musculoskeletal: Nontender with normal range of motion in extremities. No lower extremity tenderness, minimal edema Neurologic:  Normal speech and language. No gross focal neurologic deficits are appreciated.  Skin:  Skin is warm, dry and intact. No rash noted. Psychiatric: Mood and affect are normal. Speech and behavior are normal.  ____________________________________________  EKG: Interpreted by me.  Sinus rhythm with first-degree AV block, rate is 80 bpm, frequent PVCs and some bigeminy, normal QT, normal axis  ____________________________________________  ED COURSE:  As part of my medical decision making, I reviewed the following data within the [place] History obtained from family if available, nursing notes, old chart and ekg, as well as notes from prior ED visits. Patient presented for shortness of breath and likely CHF, we will  assess with labs and imaging as indicated at this time.   Procedures ____________________________________________   LABS (pertinent positives/negatives)  Labs Reviewed  BASIC METABOLIC PANEL - Abnormal; Notable for the following components:      Result Value   Chloride 115 (*)    CO2 14 (*)    Glucose, Bld 189 (*)    BUN 76 (*)    Creatinine, Ser 7.36 (*)    GFR calc non Af Amer 7 (*)     GFR calc Af Amer 8 (*)    All other components within normal limits  CBC - Abnormal; Notable for the following components:   RBC 3.16 (*)    Hemoglobin 9.6 (*)    HCT 30.3 (*)    RDW 15.9 (*)    All other components within normal limits  TROPONIN I  BRAIN NATRIURETIC PEPTIDE  TSH  T4, FREE    RADIOLOGY Images were viewed by me  chest x-ray Reveals pulmonary edema ____________________________________________  DIFFERENTIAL DIAGNOSIS   CHF, pneumonia, anemia, occult infection, MI  FINAL ASSESSMENT AND PLAN  Dyspnea, acute on chronic renal failure, pulmonary edema   Plan: The patient had presented for shortness of breath particularly dyspnea on exertion. Patient's labs did indicate significant worsening of his renal failure with a creatinine over 7. Patient's imaging revealed pulmonary edema with some effusions.  He may benefit from a Lasix drip as he is still making urine and if he does not improve he will require dialysis.  Patient is agreeable to plan.   Laurence Aly, MD   Note: This note was generated in part or whole with voice recognition software. Voice recognition is usually quite accurate but there are transcription errors that can and very often do occur. I apologize for any typographical errors that were not detected and corrected.     Earleen Newport, MD 03/21/18 [phone]

## 2018-03-21 NOTE — ED Notes (Signed)
Patient transported to X-ray 

## 2018-03-21 NOTE — ED Triage Notes (Signed)
Pt states "I can't breath" states he can't take a deep breath, states cough. "I got a chest cold." symptoms x 1 week. Denies COPD or asthma. Denies night sweats or weight loss recently.   States wife passed away yesterday.   A&O x 4. Ambulatory around triage room.

## 2018-03-21 NOTE — Consult Note (Signed)
[facility]  CONSULT NOTE    Date: 03/21/2018                  Patient Name:  Jose [name]  MRN: [id number]  DOB: [birth year]  Age / Sex: 72 y.o., male         PCP: [name], MD                 Service Requesting Consult: Dr. Estanislado Pandy                 Reason for Consult: Acute renal failure on chronic kidney disease stage IV            History of Present Illness: Jose [name] is a 72 y.o. black male with chronic kidney disease stage IV, hypertension, gout, depression, diabetes mellitus type II, history of prostate cancer, who was admitted to [facility] on 03/21/2018 for Acute kidney failure, unspecified ([place]) [N17.9] Shortness of breath [R06.02] Chronic kidney disease, stage IV (severe) (H[facility]) [N18.4] Acute renal failure superimposed on chronic kidney disease, unspecified CKD stage, unspecified acute renal failure type ([place]) [N17.9, N18.9]   Patient's wife passed away yesterday. She was chronically ill and patient endorses not taking his medications.   Developed shortness of breath and orthopnea this morning. Came to ED where he was found to have pulmonary edema and acute renal failure.     Medications: Outpatient medications: Medications Prior to Admission  Medication Sig Dispense Refill Last Dose  . allopurinol (ZYLOPRIM) 100 MG tablet Take 100 mg by mouth daily.    Past Week at Unknown time  . amLODipine (NORVASC) 10 MG tablet Take 10 mg by mouth daily.    Past Week at Unknown time  . aspirin EC 325 MG tablet Take 325 mg by mouth daily.    Past Week at Unknown time  . cloNIDine (CATAPRES) 0.2 MG tablet Take 0.2 mg by mouth 3 (three) times daily.    Past Week at Unknown time  . escitalopram (LEXAPRO) 10 MG tablet Take 10 mg by mouth daily.    Past Week at Unknown time    Current medications: Current Facility-Administered Medications  Medication Dose Route Frequency Provider Last Rate Last Dose  . 0.9 %  sodium chloride infusion  250 mL  Intravenous PRN Pyreddy, Reatha Harps, MD      . acetaminophen (TYLENOL) tablet 650 mg  650 mg Oral Q6H PRN Pyreddy, Reatha Harps, MD       Or  . acetaminophen (TYLENOL) suppository 650 mg  650 mg Rectal Q6H PRN Saundra Shelling, MD      . Derrill Memo ON 03/22/2018] amLODipine (NORVASC) tablet 10 mg  10 mg Oral Daily Pyreddy, Reatha Harps, MD      . aspirin EC tablet 325 mg  325 mg Oral Daily Pyreddy, Pavan, MD      . cloNIDine (CATAPRES) tablet 0.2 mg  0.2 mg Oral BID Pyreddy, Reatha Harps, MD      . escitalopram (LEXAPRO) tablet 10 mg  10 mg Oral Daily Pyreddy, Reatha Harps, MD      . Derrill Memo ON 03/22/2018] glipiZIDE (GLUCOTROL) tablet 5 mg  5 mg Oral QAC breakfast Pyreddy, Pavan, MD      . heparin injection 5,000 Units  5,000 Units Subcutaneous Q8H Pyreddy, Pavan, MD      . hydrALAZINE (APRESOLINE) tablet 25 mg  25 mg Oral Q8H Pyreddy, Pavan, MD      . insulin aspart (novoLOG) injection 0-15 Units  0-15 Units Subcutaneous TID WC  Saundra Shelling, MD      . insulin aspart (novoLOG) injection 0-5 Units  0-5 Units Subcutaneous QHS Pyreddy, Pavan, MD      . ondansetron (ZOFRAN) tablet 4 mg  4 mg Oral Q6H PRN Pyreddy, Reatha Harps, MD       Or  . ondansetron (ZOFRAN) injection 4 mg  4 mg Intravenous Q6H PRN Pyreddy, Pavan, MD      . senna-docusate (Senokot-S) tablet 1 tablet  1 tablet Oral QHS PRN Pyreddy, Pavan, MD      . sodium chloride flush (NS) 0.9 % injection 3 mL  3 mL Intravenous Q12H Pyreddy, Pavan, MD      . sodium chloride flush (NS) 0.9 % injection 3 mL  3 mL Intravenous PRN Pyreddy, Pavan, MD      . traMADol (ULTRAM) tablet 50 mg  50 mg Oral Q12H PRN Saundra Shelling, MD          Allergies: Allergies  Allergen Reactions  . Penicillin G     Other reaction(s): Unknown      Past Medical History: Past Medical History:  Diagnosis Date  . Diabetes mellitus without complication ([place])   . Hypertension   . Prostate cancer [facility])      Past Surgical History: Past Surgical History:  Procedure Laterality Date  . APPENDECTOMY     . CHOLECYSTECTOMY       Family History: History reviewed. No pertinent family history.   Social History: Social History   Socioeconomic History  . Marital status: Married    Spouse name: Not on file  . Number of children: Not on file  . Years of education: Not on file  . Highest education level: Not on file  Occupational History    Employer: RETIRED  Social Needs  . Financial resource strain: Not on file  . Food insecurity:    Worry: Not on file    Inability: Not on file  . Transportation needs:    Medical: Not on file    Non-medical: Not on file  Tobacco Use  . Smoking status: Current Every Day Smoker  . Smokeless tobacco: Never Used  Substance and Sexual Activity  . Alcohol use: Never    Alcohol/week: 0.0 standard drinks    Frequency: Never  . Drug use: Never  . Sexual activity: Not Currently  Lifestyle  . Physical activity:    Days per week: Not on file    Minutes per session: Not on file  . Stress: Not on file  Relationships  . Social connections:    Talks on phone: Not on file    Gets together: Not on file    Attends religious service: Not on file    Active member of club or organization: Not on file    Attends meetings of clubs or organizations: Not on file    Relationship status: Not on file  . Intimate partner violence:    Fear of current or ex partner: Not on file    Emotionally abused: Not on file    Physically abused: Not on file    Forced sexual activity: Not on file  Other Topics Concern  . Not on file  Social History Narrative  . Not on file     Review of Systems: Review of Systems  Constitutional: Negative.  Negative for chills, diaphoresis, fever, malaise/fatigue and weight loss.  HENT: Negative.  Negative for congestion, ear discharge, ear pain, hearing loss, nosebleeds, sinus pain, sore throat and tinnitus.   Eyes: Negative for  blurred vision, double vision, photophobia, pain, discharge and redness.  Respiratory: Positive for cough  and shortness of breath. Negative for hemoptysis, sputum production, wheezing and stridor.   Cardiovascular: Positive for orthopnea, leg swelling and PND. Negative for chest pain, palpitations and claudication.  Gastrointestinal: Negative.  Negative for abdominal pain, blood in stool, constipation, diarrhea, heartburn, melena, nausea and vomiting.  Genitourinary: Negative.  Negative for dysuria, flank pain, frequency, hematuria and urgency.  Musculoskeletal: Negative.  Negative for back pain, falls, joint pain, myalgias and neck pain.  Skin: Negative.  Negative for itching and rash.  Neurological: Negative.  Negative for dizziness, tingling, tremors, sensory change, speech change, focal weakness, seizures, loss of consciousness, weakness and headaches.  Endo/Heme/Allergies: Negative.  Negative for environmental allergies and polydipsia. Does not bruise/bleed easily.  Psychiatric/Behavioral: Negative.  Negative for depression, hallucinations, memory loss, substance abuse and suicidal ideas. The patient is not nervous/anxious and does not have insomnia.     Vital Signs: Blood pressure 135/83, pulse 71, temperature 97.9 F (36.6 C), temperature source Oral, resp. rate (!) 21, height 6\' 1"  (1.854 m), weight 71.2 kg, SpO2 96 %.  Weight trends: Filed Weights   03/21/18 1041  Weight: 71.2 kg    Physical Exam: General: NAD, laying in bed  Head: Normocephalic, atraumatic. Moist oral mucosal membranes  Eyes: Anicteric, PERRL  Neck: Supple, trachea midline  Lungs:  Bilateral crackles  Heart: Regular rate and rhythm  Abdomen:  Soft, nontender,   Extremities:  trace peripheral edema.  Neurologic: Nonfocal, moving all four extremities  Skin: No lesions  Access: none     Lab results: Basic Metabolic Panel: Recent Labs  Lab 03/21/18 1044  NA 139  K 4.8  CL 115*  CO2 14*  GLUCOSE 189*  BUN 76*  CREATININE 7.36*  CALCIUM 9.0    Liver Function Tests: No results for input(s): AST,  ALT, ALKPHOS, BILITOT, PROT, ALBUMIN in the last 168 hours. No results for input(s): LIPASE, AMYLASE in the last 168 hours. No results for input(s): AMMONIA in the last 168 hours.  CBC: Recent Labs  Lab 03/21/18 1044  WBC 9.4  HGB 9.6*  HCT 30.3*  MCV 95.9  PLT 227    Cardiac Enzymes: Recent Labs  Lab 03/21/18 1044  TROPONINI <0.03    BNP: Invalid input(s): POCBNP  CBG: No results for input(s): GLUCAP in the last 168 hours.  Microbiology: No results found for this or any previous visit.  Coagulation Studies: No results for input(s): LABPROT, INR in the last 72 hours.  Urinalysis: No results for input(s): COLORURINE, LABSPEC, PHURINE, GLUCOSEU, HGBUR, BILIRUBINUR, KETONESUR, PROTEINUR, UROBILINOGEN, NITRITE, LEUKOCYTESUR in the last 72 hours.  Invalid input(s): APPERANCEUR    Imaging: Dg Chest 2 View  Result Date: 03/21/2018 CLINICAL DATA:  Shortness of breath and cold like symptoms. History of prostate cancer. EXAM: CHEST - 2 VIEW COMPARISON:  03/08/2007 FINDINGS: Mildly enlarged cardiac silhouette. Calcific atherosclerotic disease of the aorta. There is no evidence of pneumothorax. Small bilateral pleural effusions and mild pulmonary edema. Mild bilateral peribronchial opacities. Osseous structures are without acute abnormality. Soft tissues are grossly normal. IMPRESSION: Mild interstitial pulmonary edema with small bilateral pleural effusions. Probable superimposed peribronchial thickening may represent acute bronchitis. Electronically Signed   By: Fidela Salisbury M.D.   On: 03/21/2018 12:15      Assessment & Plan: Jose [name] is a 72 y.o. black male with chronic kidney disease stage IV, hypertension, gout, depression, diabetes mellitus type II, history of prostate  cancer, who was admitted to [facility] on 03/21/2018 for Acute kidney failure, unspecified ([place]) [N17.9] Shortness of breath [R06.02] Chronic kidney disease, stage IV (severe) (H[facility])  [N18.4] Acute renal failure superimposed on chronic kidney disease, unspecified CKD stage, unspecified acute renal failure type ([place]) [N17.9, N18.9]   1. Acute Renal Failure with metabolic acidosis on chronic kidney disease stage IV with proteinuria: baseline creatinine of 4.06, GFR of 18 on 10/29/17.  Followed by Dr. Kathrynn Ducking, [facility] Acute renal failure secondary to acute cardiorenal syndrome versus progression of kidney disease to chronic kidney disease stage V.  Chronic kidney disease secondary to diabetic nephropathy. History of renal biopsy on  - renal ultrasound reviewed - Discussed dialysis modalities.  - Furosemide gtt  - Check urine studies, check hepatitis panel   2. Anemia with chronic kidney disease: normocytic  3. Hypertension: concerning for acute exacerbation of heart failure. No echocardiogram.  Home blood pressure regimen of amlodipine and clonidine.  - IV furosemide gtt as above - Check echocardiogram.    LOS: 0 Brendon Christoffel 10/20/20192:06 PM

## 2018-03-21 NOTE — ED Notes (Signed)
Patient transported to US 

## 2018-03-21 NOTE — H&P (Signed)
[facility] [place] NAME: Jose [name]    MR#:  [id number]  DATE OF BIRTH:  1945-06-03  DATE OF ADMISSION:  03/21/2018  PRIMARY CARE PHYSICIAN: [name], MD   REQUESTING/REFERRING PHYSICIAN:   CHIEF COMPLAINT:   Chief Complaint  Patient presents with  . Shortness of Breath    HISTORY OF PRESENT ILLNESS: Jose [name]  is a 72 y.o. male with a known history of diabetes mellitus type 2, chronic kidney disease, hypertension, prostate cancer presented to the emergency room for difficulty breathing and dyspnea on exertion.  Patient has orthopnea.  He noticed increased swelling in the lower extremities.  Has occasional cough.  Was evaluated in the emergency room chest x-ray showed pulmonary edema and creatinine is 7.3.  Hospitalist service was consulted for further care.  No complaints of any chest pain.  Patient does not use any home oxygen.  Currently has been placed on oxygen by nasal cannula at 2 L in the emergency room.  PAST MEDICAL HISTORY:   Past Medical History:  Diagnosis Date  . Diabetes mellitus without complication ([place])   . Hypertension   . Prostate cancer ([place])     PAST SURGICAL HISTORY:  Past Surgical History:  Procedure Laterality Date  . APPENDECTOMY    . CHOLECYSTECTOMY      SOCIAL HISTORY:  Social History   Tobacco Use  . Smoking status: Current Every Day Smoker  . Smokeless tobacco: Never Used  Substance Use Topics  . Alcohol use: Not on file    FAMILY HISTORY: History reviewed. No pertinent family history.  DRUG ALLERGIES:  Allergies  Allergen Reactions  . Penicillin G     Other reaction(s): Unknown    REVIEW OF SYSTEMS:   CONSTITUTIONAL: No fever, has fatigue and weakness.  EYES: No blurred or double vision.  EARS, NOSE, AND THROAT: No tinnitus or ear pain.  RESPIRATORY: No cough, has shortness of breath, wheezing  no hemoptysis.  CARDIOVASCULAR: No chest pain, has orthopnea,  edema.  GASTROINTESTINAL: No nausea, vomiting, diarrhea or abdominal pain.  GENITOURINARY: No dysuria, hematuria.  ENDOCRINE: No polyuria, nocturia,  HEMATOLOGY: No anemia, easy bruising or bleeding SKIN: No rash or lesion. MUSCULOSKELETAL: No joint pain or arthritis.   NEUROLOGIC: No tingling, numbness, weakness.  PSYCHIATRY: No anxiety or depression.   MEDICATIONS AT HOME:  Prior to Admission medications   Medication Sig Start Date End Date Taking? Authorizing Provider  allopurinol (ZYLOPRIM) 100 MG tablet Take 100 mg by mouth. 03/08/14   [provider]  amLODipine (NORVASC) 10 MG tablet Take 10 mg by mouth. 03/08/14   [provider]  aspirin EC 325 MG tablet Take 325 mg by mouth.    [provider]  cloNIDine (CATAPRES) 0.2 MG tablet Take 0.2 mg by mouth. 06/07/14   [provider]  escitalopram (LEXAPRO) 10 MG tablet Take 10 mg by mouth. 03/08/14   [provider]  glipiZIDE (GLUCOTROL) 5 MG tablet Take 5 mg by mouth.    [provider]  hydrALAZINE (APRESOLINE) 25 MG tablet Take 25 mg by mouth. 03/08/14   [provider]  sodium polystyrene (KAYEXALATE) 15 GM/60ML suspension  05/18/14   [provider]  traMADol Veatrice Bourbon) 50 MG tablet  03/13/14   [provider]      PHYSICAL EXAMINATION:   VITAL SIGNS: Blood pressure (!) 154/60, pulse (!) 35, temperature 97.9 F (36.6 C), temperature source Oral, resp. rate 16, height 6\' 1"  (  1.854 m), weight 71.2 kg, SpO2 97 %.  GENERAL:  72 y.o.-year-old patient lying in the bed with no acute distress.  EYES: Pupils equal, round, reactive to light and accommodation. No scleral icterus. Extraocular muscles intact.  HEENT: Head atraumatic, normocephalic. Oropharynx and nasopharynx clear.  NECK:  Supple, no jugular venous distention. No thyroid enlargement, no tenderness.  LUNGS: Decreased breath sounds bilaterally, bibasilar crepitations heard. No use of accessory  muscles of respiration.  CARDIOVASCULAR: S1, S2 normal. No murmurs, rubs, or gallops.  ABDOMEN: Soft, nontender, nondistended. Bowel sounds present. No organomegaly or mass.  EXTREMITIES: Has pedal edema,  No cyanosis, or clubbing.  NEUROLOGIC: Cranial nerves II through XII are intact. Muscle strength 5/5 in all extremities. Sensation intact. Gait not checked.  PSYCHIATRIC: The patient is alert and oriented x 3.  SKIN: No obvious rash, lesion, or ulcer.   LABORATORY PANEL:   CBC Recent Labs  Lab 03/21/18 1044  WBC 9.4  HGB 9.6*  HCT 30.3*  PLT 227  MCV 95.9  MCH 30.4  MCHC 31.7  RDW 15.9*   ------------------------------------------------------------------------------------------------------------------  Chemistries  Recent Labs  Lab 03/21/18 1044  NA 139  K 4.8  CL 115*  CO2 14*  GLUCOSE 189*  BUN 76*  CREATININE 7.36*  CALCIUM 9.0   ------------------------------------------------------------------------------------------------------------------ estimated creatinine clearance is 9.1 mL/min (A) (by C-G formula based on SCr of 7.36 mg/dL (H)). ------------------------------------------------------------------------------------------------------------------ No results for input(s): TSH, T4TOTAL, T3FREE, THYROIDAB in the last 72 hours.  Invalid input(s): FREET3   Coagulation profile No results for input(s): INR, PROTIME in the last 168 hours. ------------------------------------------------------------------------------------------------------------------- No results for input(s): DDIMER in the last 72 hours. -------------------------------------------------------------------------------------------------------------------  Cardiac Enzymes Recent Labs  Lab 03/21/18 1044  TROPONINI <0.03   ------------------------------------------------------------------------------------------------------------------ Invalid input(s):  POCBNP  ---------------------------------------------------------------------------------------------------------------  Urinalysis No results found for: COLORURINE, APPEARANCEUR, LABSPEC, PHURINE, GLUCOSEU, HGBUR, BILIRUBINUR, KETONESUR, PROTEINUR, UROBILINOGEN, NITRITE, LEUKOCYTESUR   RADIOLOGY: Dg Chest 2 View  Result Date: 03/21/2018 CLINICAL DATA:  Shortness of breath and cold like symptoms. History of prostate cancer. EXAM: CHEST - 2 VIEW COMPARISON:  03/08/2007 FINDINGS: Mildly enlarged cardiac silhouette. Calcific atherosclerotic disease of the aorta. There is no evidence of pneumothorax. Small bilateral pleural effusions and mild pulmonary edema. Mild bilateral peribronchial opacities. Osseous structures are without acute abnormality. Soft tissues are grossly normal. IMPRESSION: Mild interstitial pulmonary edema with small bilateral pleural effusions. Probable superimposed peribronchial thickening may represent acute bronchitis. Electronically Signed   By: Fidela Salisbury M.D.   On: 03/21/2018 12:15    EKG: Orders placed or performed during the hospital encounter of 03/21/18  . ED EKG within 10 minutes  . 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 on dialysis, diabetes mellitus type 2, hypertension, prostate cancer presented to the emergency room for shortness of breath   -Acute pulmonary edema and fluid overload Nephrology consult on advice starting IV Lasix drip for diuresis Monitor renal function  -Acute on chronic kidney disease stage IV Nephrology consult Lasix for diuresis as per nephrology recommendation Dialysis if needed as per nephrology  -Dyspnea secondary to pulmonary edema Oxygen by nasal cannula  -Type 2 diabetes mellitus Diabetic diet with sliding scale coverage with insulin  -Prostate cancer Supportive care  -DVT prophylaxis subcu heparin  -Tobacco abuse Tobacco cessation  counseled to the patient for 6 minutes Nicotine patch offered  All the records are reviewed and case discussed with ED provider. Management plans discussed  with the patient, family and they are in agreement.  CODE STATUS:Full code    TOTAL TIME TAKING CARE OF THIS PATIENT: 52 minutes.    Saundra Shelling M.D on 03/21/2018 at 12:30 PM  Between 7am to 6pm - Pager - [phone]  After 6pm go to [email or web address] - password [email or web address]  [facility]  Office  [phone]  CC: Primary care physician; [name], MD

## 2018-03-22 ENCOUNTER — Inpatient Hospital Stay (HOSPITAL_COMMUNITY)
Admit: 2018-03-22 | Discharge: 2018-03-22 | Disposition: A | Discharge status: Home / Self Care | Payer: Medicare Other | Attending: Nephrology | Admitting: Nephrology

## 2018-03-22 DIAGNOSIS — I5030 Unspecified diastolic (congestive) heart failure: Secondary | ICD-10-CM

## 2018-03-22 LAB — CBC
HCT: 27.8 % — ABNORMAL LOW (ref 39.0–52.0)
HEMOGLOBIN: 9.1 g/dL — AB (ref 13.0–17.0)
MCH: 30.7 pg (ref 26.0–34.0)
MCHC: 32.7 g/dL (ref 30.0–36.0)
MCV: 93.9 fL (ref 80.0–100.0)
NRBC: 0 % (ref 0.0–0.2)
PLATELETS: 221 10*3/uL (ref 150–400)
RBC: 2.96 MIL/uL — AB (ref 4.22–5.81)
RDW: 15.4 % (ref 11.5–15.5)
WBC: 10.4 10*3/uL (ref 4.0–10.5)

## 2018-03-22 LAB — GLUCOSE, CAPILLARY
GLUCOSE-CAPILLARY: 105 mg/dL — AB (ref 70–99)
GLUCOSE-CAPILLARY: 131 mg/dL — AB (ref 70–99)
Glucose-Capillary: 62 mg/dL — ABNORMAL LOW (ref 70–99)
Glucose-Capillary: 108 mg/dL — ABNORMAL HIGH (ref 70–99)
Glucose-Capillary: 105 mg/dL — ABNORMAL HIGH (ref 70–99)

## 2018-03-22 LAB — RENAL FUNCTION PANEL
Albumin: 3.4 g/dL — ABNORMAL LOW (ref 3.5–5.0)
Anion gap: 11 (ref 5–15)
BUN: 76 mg/dL — ABNORMAL HIGH (ref 8–23)
CHLORIDE: 113 mmol/L — AB (ref 98–111)
CO2: 16 mmol/L — ABNORMAL LOW (ref 22–32)
CREATININE: 6.8 mg/dL — AB (ref 0.61–1.24)
Calcium: 8.7 mg/dL — ABNORMAL LOW (ref 8.9–10.3)
GFR calc Af Amer: 8 mL/min — ABNORMAL LOW (ref >60)
GFR, EST NON AFRICAN AMERICAN: 7 mL/min — AB (ref >60)
Glucose, Bld: 110 mg/dL — ABNORMAL HIGH (ref 70–99)
Phosphorus: 4.4 mg/dL (ref 2.5–4.6)
Potassium: 5 mmol/L (ref 3.5–5.1)
SODIUM: 140 mmol/L (ref 135–145)

## 2018-03-22 LAB — ECHOCARDIOGRAM COMPLETE
Height: 73 in
Weight: 2512 oz

## 2018-03-22 MED ORDER — HYDRALAZINE HCL 20 MG/ML IJ SOLN: 10.0000 mg | Freq: Four times a day (QID) | INTRAMUSCULAR | Status: DC | PRN

## 2018-03-22 NOTE — Progress Notes (Signed)
*  PRELIMINARY RESULTS* Echocardiogram 2D Echocardiogram has been performed.  Jose [name] 03/22/2018, 9:48 AM

## 2018-03-22 NOTE — Progress Notes (Signed)
[facility]  ROUNDING NOTE   Subjective:  Patient seen at bedside. Creatinine down a bit to 6.8 but overall EGFR remains low at 8. Sitting up in bed comfortably.   Objective:  Vital signs in last 24 hours:  Temp:  [98.2 F (36.8 C)-98.8 F (37.1 C)] 98.8 F (37.1 C) (10/21 0941) Pulse Rate:  [78-85] 85 (10/21 0941) Resp:  [20-22] 22 (10/21 0437) BP: (157-171)/(74-107) 171/107 (10/21 0941) SpO2:  [93 %-97 %] 97 % (10/21 0941)  Weight change:  Filed Weights   03/21/18 1041  Weight: 71.2 kg    Intake/Output: I/O last 3 completed shifts: In: 50.2 [I.V.:50.2] Out: 1200 [Urine:1200]   Intake/Output this shift:  Total I/O In: 240 [P.O.:240] Out: 825 [Urine:825]  Physical Exam: General: No acute distress  Head: Normocephalic, atraumatic. Moist oral mucosal membranes  Eyes: Anicteric  Neck: Supple, trachea midline  Lungs:  Basilar rales, normal effort  Heart: S1S2 no rubs  Abdomen:  Soft, nontender, bowel sounds present  Extremities: Trace peripheral edema.  Neurologic: Awake, alert, following commands  Skin: No lesions       Basic Metabolic Panel: Recent Labs  Lab 03/21/18 1044 03/21/18 1443 03/22/18 0528  NA 139  --  140  K 4.8  --  5.0  CL 115*  --  113*  CO2 14*  --  16*  GLUCOSE 189*  --  110*  BUN 76*  --  76*  CREATININE 7.36*  --  6.80*  CALCIUM 9.0  --  8.7*  PHOS  --  3.9 4.4    Liver Function Tests: Recent Labs  Lab 03/22/18 0528  ALBUMIN 3.4*   No results for input(s): LIPASE, AMYLASE in the last 168 hours. No results for input(s): AMMONIA in the last 168 hours.  CBC: Recent Labs  Lab 03/21/18 1044 03/22/18 0528  WBC 9.4 10.4  HGB 9.6* 9.1*  HCT 30.3* 27.8*  MCV 95.9 93.9  PLT 227 221    Cardiac Enzymes: Recent Labs  Lab 03/21/18 1044  TROPONINI <0.03    BNP: Invalid input(s): POCBNP  CBG: Recent Labs  Lab 03/21/18 1652 03/21/18 2215 03/22/18 0736 03/22/18 1157  GLUCAP 104* 116* 105* 131*     Microbiology: No results found for this or any previous visit.  Coagulation Studies: No results for input(s): LABPROT, INR in the last 72 hours.  Urinalysis: No results for input(s): COLORURINE, LABSPEC, PHURINE, GLUCOSEU, HGBUR, BILIRUBINUR, KETONESUR, PROTEINUR, UROBILINOGEN, NITRITE, LEUKOCYTESUR in the last 72 hours.  Invalid input(s): APPERANCEUR    Imaging: Dg Chest 2 View  Result Date: 03/21/2018 CLINICAL DATA:  Shortness of breath and cold like symptoms. History of prostate cancer. EXAM: CHEST - 2 VIEW COMPARISON:  03/08/2007 FINDINGS: Mildly enlarged cardiac silhouette. Calcific atherosclerotic disease of the aorta. There is no evidence of pneumothorax. Small bilateral pleural effusions and mild pulmonary edema. Mild bilateral peribronchial opacities. Osseous structures are without acute abnormality. Soft tissues are grossly normal. IMPRESSION: Mild interstitial pulmonary edema with small bilateral pleural effusions. Probable superimposed peribronchial thickening may represent acute bronchitis. Electronically Signed   By: Fidela Salisbury M.D.   On: 03/21/2018 12:15   US Renal  Result Date: 03/21/2018 CLINICAL DATA:  Acute renal failure.  Prostate cancer. EXAM: RENAL / URINARY TRACT ULTRASOUND COMPLETE COMPARISON:  CT of the pelvis 08/04/2014 FINDINGS: Right Kidney: Length: 8.5 cm. The parenchyma is echogenic. No discrete lesions are present. Left Kidney: Length: 6.2 cm. The parenchyma is echogenic. A benign cyst in the midportion of the  left kidney measures 1.7 x 1.7 x 1.6 cm. Bladder: Appears normal for degree of bladder distention. Bilateral pleural effusions are present. IMPRESSION: 1. Small echogenic kidneys compatible with nonspecific medical renal disease. 2. Benign cyst of the left kidney. 3. Bilateral pleural effusions. Electronically Signed   By: San Morelle M.D.   On: 03/21/2018 14:06     Medications:   . sodium chloride    . furosemide (LASIX)  infusion 4 mg/hr (03/21/18 1514)   . amLODipine  10 mg Oral Daily  . aspirin EC  325 mg Oral Daily  . cloNIDine  0.2 mg Oral BID  . escitalopram  10 mg Oral Daily  . heparin  5,000 Units Subcutaneous Q8H  . hydrALAZINE  25 mg Oral Q8H  . insulin aspart  0-15 Units Subcutaneous TID WC  . insulin aspart  0-5 Units Subcutaneous QHS  . sodium chloride flush  3 mL Intravenous Q12H   sodium chloride, acetaminophen **OR** acetaminophen, hydrALAZINE, ondansetron **OR** ondansetron (ZOFRAN) IV, senna-docusate, sodium chloride flush, traMADol  Assessment/ Plan:  72 y.o. male with chronic kidney disease stage IV, hypertension, gout, depression, diabetes mellitus type II, history of prostate cancer, who was admitted to [facility] on 03/21/2018 for Acute kidney failure.   1.  Acute renal failure/chronic kidney disease stage IV/proteinuria/baseline EGFR 18 on 10/29/2017.  Patient followed by [facility] nephrology as an outpatient.  Suspect acute renal failure is secondary to acute cardiorenal syndrome.  However there is high likelihood that this may now represent end-stage renal disease.  Renal function not significantly improved.  Maintain the patient on Lasix for now.  2.  Anemia of chronic kidney disease.  Hemoglobin currently 9.1.  Patient will likely end up requiring Epogen as an outpatient.  3.  Hypertension.  Continue amlodipine, clonidine, and hydralazine for blood pressure control.   LOS: 1 Trevin Gartrell 10/21/20195:17 PM

## 2018-03-22 NOTE — Progress Notes (Signed)
[facility] at [facility]                                                                                                                                                                                  Patient Demographics   Jose [name], is a 72 y.o. male, DOB - 14-Jan-1946, POE:[id number]  Admit date - 03/21/2018   Admitting Physician Saundra Shelling, MD  Outpatient Primary MD for the patient is [name], MD   LOS - 1  Subjective: Patient feeling better shortness of breath improved wants to go home    Review of Systems:   CONSTITUTIONAL: No documented fever. No fatigue, weakness. No weight gain, no weight loss.  EYES: No blurry or double vision.  ENT: No tinnitus. No postnasal drip. No redness of the oropharynx.  RESPIRATORY: No cough, no wheeze, no hemoptysis.  Positive dyspnea.  CARDIOVASCULAR: No chest pain. No orthopnea. No palpitations. No syncope.  GASTROINTESTINAL: No nausea, no vomiting or diarrhea. No abdominal pain. No melena or hematochezia.  GENITOURINARY: No dysuria or hematuria.  ENDOCRINE: No polyuria or nocturia. No heat or cold intolerance.  HEMATOLOGY: No anemia. No bruising. No bleeding.  INTEGUMENTARY: No rashes. No lesions.  MUSCULOSKELETAL: No arthritis. No swelling. No gout.  NEUROLOGIC: No numbness, tingling, or ataxia. No seizure-type activity.  PSYCHIATRIC: No anxiety. No insomnia. No ADD.    Vitals:   Vitals:   03/21/18 1418 03/21/18 2136 03/22/18 0437 03/22/18 0941  BP: (!) 170/78 (!) 157/74 (!) 159/85 (!) 171/107  Pulse: (!) 43 79 78 85  Resp:  20 (!) 22   Temp: 98 F (36.7 C) 98.2 F (36.8 C) 98.5 F (36.9 C) 98.8 F (37.1 C)  TempSrc: Oral Oral Oral Oral  SpO2: 96% 97% 93% 97%  Weight:      Height:        Wt Readings from Last 3 Encounters:  03/21/18 71.2 kg  02/07/15 83.3 kg     Intake/Output Summary (Last 24 hours) at 03/22/2018 1403 Last data filed at 03/22/2018 0953 Gross per 24 hour   Intake 50.2 ml  Output 1200 ml  Net -1149.8 ml    Physical Exam:   GENERAL: Pleasant-appearing in no apparent distress.  HEAD, EYES, EARS, NOSE AND THROAT: Atraumatic, normocephalic. Extraocular muscles are intact. Pupils equal and reactive to light. Sclerae anicteric. No conjunctival injection. No oro-pharyngeal erythema.  NECK: Supple. There is no jugular venous distention. No bruits, no lymphadenopathy, no thyromegaly.  HEART: Regular rate and rhythm,. No murmurs, no rubs, no clicks.  LUNGS: Bilateral crackles throughout both lungs no sensory muscle usage ABDOMEN: Soft, flat, nontender, nondistended. Has good bowel sounds. No hepatosplenomegaly appreciated.  EXTREMITIES: No evidence of any cyanosis, clubbing, or 1+ peripheral edema.  +2 pedal and radial pulses bilaterally.  NEUROLOGIC: The patient is alert, awake, and oriented x3 with no focal motor or sensory deficits appreciated bilaterally.  SKIN: Moist and warm with no rashes appreciated.  Psych: Not anxious, depressed LN: No inguinal LN enlargement    Antibiotics   Anti-infectives (From admission, onward)   None      Medications   Scheduled Meds: . amLODipine  10 mg Oral Daily  . aspirin EC  325 mg Oral Daily  . cloNIDine  0.2 mg Oral BID  . escitalopram  10 mg Oral Daily  . heparin  5,000 Units Subcutaneous Q8H  . hydrALAZINE  25 mg Oral Q8H  . insulin aspart  0-15 Units Subcutaneous TID WC  . insulin aspart  0-5 Units Subcutaneous QHS  . sodium chloride flush  3 mL Intravenous Q12H   Continuous Infusions: . sodium chloride    . furosemide (LASIX) infusion 4 mg/hr (03/21/18 1514)   PRN Meds:.sodium chloride, acetaminophen **OR** acetaminophen, hydrALAZINE, ondansetron **OR** ondansetron (ZOFRAN) IV, senna-docusate, sodium chloride flush, traMADol   Data Review:   Micro Results No results found for this or any previous visit (from the past 240 hour(s)).  Radiology Reports Dg Chest 2 View  Result Date:  03/21/2018 CLINICAL DATA:  Shortness of breath and cold like symptoms. History of prostate cancer. EXAM: CHEST - 2 VIEW COMPARISON:  03/08/2007 FINDINGS: Mildly enlarged cardiac silhouette. Calcific atherosclerotic disease of the aorta. There is no evidence of pneumothorax. Small bilateral pleural effusions and mild pulmonary edema. Mild bilateral peribronchial opacities. Osseous structures are without acute abnormality. Soft tissues are grossly normal. IMPRESSION: Mild interstitial pulmonary edema with small bilateral pleural effusions. Probable superimposed peribronchial thickening may represent acute bronchitis. Electronically Signed   By: Fidela Salisbury M.D.   On: 03/21/2018 12:15   US Renal  Result Date: 03/21/2018 CLINICAL DATA:  Acute renal failure.  Prostate cancer. EXAM: RENAL / URINARY TRACT ULTRASOUND COMPLETE COMPARISON:  CT of the pelvis 08/04/2014 FINDINGS: Right Kidney: Length: 8.5 cm. The parenchyma is echogenic. No discrete lesions are present. Left Kidney: Length: 6.2 cm. The parenchyma is echogenic. A benign cyst in the midportion of the left kidney measures 1.7 x 1.7 x 1.6 cm. Bladder: Appears normal for degree of bladder distention. Bilateral pleural effusions are present. IMPRESSION: 1. Small echogenic kidneys compatible with nonspecific medical renal disease. 2. Benign cyst of the left kidney. 3. Bilateral pleural effusions. Electronically Signed   By: San Morelle M.D.   On: 03/21/2018 14:06     CBC Recent Labs  Lab 03/21/18 1044 03/22/18 0528  WBC 9.4 10.4  HGB 9.6* 9.1*  HCT 30.3* 27.8*  PLT 227 221  MCV 95.9 93.9  MCH 30.4 30.7  MCHC 31.7 32.7  RDW 15.9* 15.4    Chemistries  Recent Labs  Lab 03/21/18 1044 03/22/18 0528  NA 139 140  K 4.8 5.0  CL 115* 113*  CO2 14* 16*  GLUCOSE 189* 110*  BUN 76* 76*  CREATININE 7.36* 6.80*  CALCIUM 9.0 8.7*    ------------------------------------------------------------------------------------------------------------------ estimated creatinine clearance is 9.9 mL/min (A) (by C-G formula based on SCr of 6.8 mg/dL (H)). ------------------------------------------------------------------------------------------------------------------ No results for input(s): HGBA1C in the last 72 hours. ------------------------------------------------------------------------------------------------------------------ No results for input(s): CHOL, HDL, LDLCALC, TRIG, CHOLHDL, LDLDIRECT in the last 72 hours. ------------------------------------------------------------------------------------------------------------------ Recent Labs    03/21/18 1101  TSH 6.748*   ------------------------------------------------------------------------------------------------------------------ No results for input(s): VITAMINB12, FOLATE,  FERRITIN, TIBC, IRON, RETICCTPCT in the last 72 hours.  Coagulation profile No results for input(s): INR, PROTIME in the last 168 hours.  No results for input(s): DDIMER in the last 72 hours.  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, diabetes mellitus type 2, hypertension, prostate cancer presented to the emergency room for shortness of breath   -Acute pulmonary edema and fluid overload Appreciate nephrology input Continue IV Lasix drip for diuresis Monitor renal function  -Acute on chronic kidney disease stage IV Appreciate nephrology consult Renal function improved with IV Lasix  -Dyspnea secondary to pulmonary edema Oxygen by nasal cannula  -Type 2 diabetes mellitus Diabetic diet with sliding scale coverage with  insulin  -Prostate cancer Supportive care  -DVT prophylaxis subcu heparin  -Tobacco abuse Tobacco cessation counseled to the patient for 6 minutes Nicotine patch offered     Code Status Orders  (From admission, onward)         Start     Ordered   03/21/18 1316  Full code  Continuous     03/21/18 1315        Code Status History    This patient has a current code status but no historical code status.           Consults  nephrology  DVT Prophylaxis  Lovenox    Lab Results  Component Value Date   PLT 221 03/22/2018     Time Spent in minutes   68min  Greater than 50% of time spent in care coordination and counseling patient regarding the condition and plan of care.   Dustin Flock M.D on 03/22/2018 at 2:03 PM  Between 7am to 6pm - Pager - [phone]  After 6pm go to [email or web address] - Proofreader  [employer]   Office  [phone]

## 2018-03-23 LAB — RENAL FUNCTION PANEL
ANION GAP: 10 (ref 5–15)
Albumin: 3.3 g/dL — ABNORMAL LOW (ref 3.5–5.0)
BUN: 83 mg/dL — ABNORMAL HIGH (ref 8–23)
CHLORIDE: 114 mmol/L — AB (ref 98–111)
CO2: 17 mmol/L — AB (ref 22–32)
Calcium: 8.8 mg/dL — ABNORMAL LOW (ref 8.9–10.3)
Creatinine, Ser: 7.09 mg/dL — ABNORMAL HIGH (ref 0.61–1.24)
GFR calc non Af Amer: 7 mL/min — ABNORMAL LOW (ref >60)
GFR, EST AFRICAN AMERICAN: 8 mL/min — AB (ref >60)
Glucose, Bld: 103 mg/dL — ABNORMAL HIGH (ref 70–99)
Phosphorus: 3.7 mg/dL (ref 2.5–4.6)
Potassium: 4.8 mmol/L (ref 3.5–5.1)
Sodium: 141 mmol/L (ref 135–145)

## 2018-03-23 LAB — PARATHYROID HORMONE, INTACT (NO CA): PTH: 158 pg/mL — AB (ref 15–65)

## 2018-03-23 LAB — HEPATITIS B SURFACE ANTIGEN: Hepatitis B Surface Ag: NEGATIVE

## 2018-03-23 LAB — GLUCOSE, CAPILLARY
GLUCOSE-CAPILLARY: 95 mg/dL (ref 70–99)
Glucose-Capillary: 102 mg/dL — ABNORMAL HIGH (ref 70–99)
Glucose-Capillary: 122 mg/dL — ABNORMAL HIGH (ref 70–99)
Glucose-Capillary: 145 mg/dL — ABNORMAL HIGH (ref 70–99)

## 2018-03-23 LAB — HEPATITIS B SURFACE ANTIBODY,QUALITATIVE: HEP B S AB: REACTIVE

## 2018-03-23 LAB — HEPATITIS B CORE ANTIBODY, IGM: Hep B C IgM: NEGATIVE

## 2018-03-23 LAB — HEPATITIS C ANTIBODY: HCV Ab: <0.1 s/co ratio (ref 0.0–0.9)

## 2018-03-23 MED ORDER — RENA-VITE PO TABS
1.0000 | ORAL_TABLET | Freq: Every day | ORAL | Status: DC
  Administered 2018-03-23 – 2018-03-26 (×4): 1 via ORAL
  Filled 2018-03-23 (×5): qty 1

## 2018-03-23 MED ORDER — NEPRO/CARBSTEADY PO LIQD
237.0000 mL | Freq: Two times a day (BID) | ORAL | Status: DC
  Administered 2018-03-23 – 2018-03-26 (×4): 237 mL via ORAL

## 2018-03-23 NOTE — Progress Notes (Signed)
[facility]  ROUNDING NOTE   Subjective:  We had a long discussion with the patient today. Renal function has not significantly improved. Impact EGFR remains low at 8. We talked about the potential for renal placement therapy. Patient willing to move forward.   Objective:  Vital signs in last 24 hours:  Temp:  [98.3 F (36.8 C)-99.3 F (37.4 C)] 99.3 F (37.4 C) (10/22 1025) Pulse Rate:  [48-85] 85 (10/22 1025) Resp:  [17-18] 17 (10/22 1025) BP: (166-181)/(68-89) 166/72 (10/22 1025) SpO2:  [93 %-96 %] 96 % (10/22 1025)  Weight change:  Filed Weights   03/21/18 1041  Weight: 71.2 kg    Intake/Output: I/O last 3 completed shifts: In: 502.1 [P.O.:360; I.V.:142.1] Out: 0938 [HWEXH:3716]   Intake/Output this shift:  Total I/O In: 120 [P.O.:120] Out: 500 [Urine:500]  Physical Exam: General: No acute distress  Head: Normocephalic, atraumatic. Moist oral mucosal membranes  Eyes: Anicteric  Neck: Supple, trachea midline  Lungs:  Basilar rales, normal effort  Heart: S1S2 no rubs  Abdomen:  Soft, nontender, bowel sounds present  Extremities: Trace peripheral edema.  Neurologic: Awake, alert, following commands  Skin: No lesions       Basic Metabolic Panel: Recent Labs  Lab 03/21/18 1044 03/21/18 1443 03/22/18 0528 03/23/18 0548  NA 139  --  140 141  K 4.8  --  5.0 4.8  CL 115*  --  113* 114*  CO2 14*  --  16* 17*  GLUCOSE 189*  --  110* 103*  BUN 76*  --  76* 83*  CREATININE 7.36*  --  6.80* 7.09*  CALCIUM 9.0  --  8.7* 8.8*  PHOS  --  3.9 4.4 3.7    Liver Function Tests: Recent Labs  Lab 03/22/18 0528 03/23/18 0548  ALBUMIN 3.4* 3.3*   No results for input(s): LIPASE, AMYLASE in the last 168 hours. No results for input(s): AMMONIA in the last 168 hours.  CBC: Recent Labs  Lab 03/21/18 1044 03/22/18 0528  WBC 9.4 10.4  HGB 9.6* 9.1*  HCT 30.3* 27.8*  MCV 95.9 93.9  PLT 227 221    Cardiac Enzymes: Recent Labs  Lab  03/21/18 1044  TROPONINI <0.03    BNP: Invalid input(s): POCBNP  CBG: Recent Labs  Lab 03/22/18 1157 03/22/18 1718 03/22/18 1909 03/22/18 2114 03/23/18 0739  GLUCAP 131* 62* 108* 105* 102*    Microbiology: No results found for this or any previous visit.  Coagulation Studies: No results for input(s): LABPROT, INR in the last 72 hours.  Urinalysis: No results for input(s): COLORURINE, LABSPEC, PHURINE, GLUCOSEU, HGBUR, BILIRUBINUR, KETONESUR, PROTEINUR, UROBILINOGEN, NITRITE, LEUKOCYTESUR in the last 72 hours.  Invalid input(s): APPERANCEUR    Imaging: US Renal  Result Date: 03/21/2018 CLINICAL DATA:  Acute renal failure.  Prostate cancer. EXAM: RENAL / URINARY TRACT ULTRASOUND COMPLETE COMPARISON:  CT of the pelvis 08/04/2014 FINDINGS: Right Kidney: Length: 8.5 cm. The parenchyma is echogenic. No discrete lesions are present. Left Kidney: Length: 6.2 cm. The parenchyma is echogenic. A benign cyst in the midportion of the left kidney measures 1.7 x 1.7 x 1.6 cm. Bladder: Appears normal for degree of bladder distention. Bilateral pleural effusions are present. IMPRESSION: 1. Small echogenic kidneys compatible with nonspecific medical renal disease. 2. Benign cyst of the left kidney. 3. Bilateral pleural effusions. Electronically Signed   By: San Morelle M.D.   On: 03/21/2018 14:06     Medications:   . sodium chloride    . furosemide (  LASIX) infusion 4 mg/hr (03/23/18 0600)   . amLODipine  10 mg Oral Daily  . aspirin EC  325 mg Oral Daily  . cloNIDine  0.2 mg Oral BID  . escitalopram  10 mg Oral Daily  . heparin  5,000 Units Subcutaneous Q8H  . hydrALAZINE  25 mg Oral Q8H  . insulin aspart  0-15 Units Subcutaneous TID WC  . insulin aspart  0-5 Units Subcutaneous QHS  . sodium chloride flush  3 mL Intravenous Q12H   sodium chloride, acetaminophen **OR** acetaminophen, hydrALAZINE, ondansetron **OR** ondansetron (ZOFRAN) IV, senna-docusate, sodium chloride  flush, traMADol  Assessment/ Plan:  72 y.o. male with chronic kidney disease stage IV, hypertension, gout, depression, diabetes mellitus type II, history of prostate cancer, who was admitted to [facility] on 03/21/2018 for Acute kidney failure.   1.  Acute renal failure/chronic kidney disease stage IV/proteinuria/baseline EGFR 18 on 10/29/2017.  Long discussion with the patient today.  We recommended that he initiate renal placement therapy.  Patient open to this after discussion of risks, benefits, and alternatives.  We will request PermCath placement for tomorrow.  First also treatment tomorrow.  Suspect that he most likely has end-stage renal disease.  2.  Anemia of chronic kidney disease.  Hemoglobin currently 9.1.  We will consider Epogen with dialysis.  3.  Hypertension.  Continue amlodipine, clonidine, and hydralazine for blood pressure control.   LOS: 2 Clair Bardwell 10/22/201911:49 AM

## 2018-03-23 NOTE — Progress Notes (Signed)
[place] at [facility]                                                                                                                                                                                  Patient Demographics   Jose [name], is a 72 y.o. male, DOB - Oct 16, 1945, GXQ:[id number]  Admit date - 03/21/2018   Admitting Physician Saundra Shelling, MD  Outpatient Primary MD for the patient is [name], MD   LOS - 2  Subjective: Patient's breathing improved however renal function continues to be elevated    Review of Systems:   CONSTITUTIONAL: No documented fever. No fatigue, weakness. No weight gain, no weight loss.  EYES: No blurry or double vision.  ENT: No tinnitus. No postnasal drip. No redness of the oropharynx.  RESPIRATORY: No cough, no wheeze, no hemoptysis.  Positive dyspnea.  CARDIOVASCULAR: No chest pain. No orthopnea. No palpitations. No syncope.  GASTROINTESTINAL: No nausea, no vomiting or diarrhea. No abdominal pain. No melena or hematochezia.  GENITOURINARY: No dysuria or hematuria.  ENDOCRINE: No polyuria or nocturia. No heat or cold intolerance.  HEMATOLOGY: No anemia. No bruising. No bleeding.  INTEGUMENTARY: No rashes. No lesions.  MUSCULOSKELETAL: No arthritis. No swelling. No gout.  NEUROLOGIC: No numbness, tingling, or ataxia. No seizure-type activity.  PSYCHIATRIC: No anxiety. No insomnia. No ADD.    Vitals:   Vitals:   03/22/18 2010 03/23/18 0433 03/23/18 1025 03/23/18 1441  BP: (!) 181/89 (!) 169/75 (!) 166/72 (!) 157/76  Pulse: 78 84 85 77  Resp:   17 20  Temp: 98.7 F (37.1 C) 98.3 F (36.8 C) 99.3 F (37.4 C) 98.3 F (36.8 C)  TempSrc: Oral Oral Oral Oral  SpO2: 93% 96% 96% 94%  Weight:      Height:        Wt Readings from Last 3 Encounters:  03/21/18 71.2 kg  02/07/15 83.3 kg     Intake/Output Summary (Last 24 hours) at 03/23/2018 1453 Last data filed at 03/23/2018 1000 Gross per 24 hour   Intake 342.94 ml  Output 3025 ml  Net -2682.06 ml    Physical Exam:   GENERAL: Pleasant-appearing in no apparent distress.  HEAD, EYES, EARS, NOSE AND THROAT: Atraumatic, normocephalic. Extraocular muscles are intact. Pupils equal and reactive to light. Sclerae anicteric. No conjunctival injection. No oro-pharyngeal erythema.  NECK: Supple. There is no jugular venous distention. No bruits, no lymphadenopathy, no thyromegaly.  HEART: Regular rate and rhythm,. No murmurs, no rubs, no clicks.  LUNGS: Bilateral crackles throughout both lungs no sensory muscle usage ABDOMEN: Soft, flat, nontender, nondistended. Has good bowel sounds. No hepatosplenomegaly appreciated.  EXTREMITIES: No  evidence of any cyanosis, clubbing, or 1+ peripheral edema.  +2 pedal and radial pulses bilaterally.  NEUROLOGIC: The patient is alert, awake, and oriented x3 with no focal motor or sensory deficits appreciated bilaterally.  SKIN: Moist and warm with no rashes appreciated.  Psych: Not anxious, depressed LN: No inguinal LN enlargement    Antibiotics   Anti-infectives (From admission, onward)   None      Medications   Scheduled Meds: . amLODipine  10 mg Oral Daily  . aspirin EC  325 mg Oral Daily  . cloNIDine  0.2 mg Oral BID  . escitalopram  10 mg Oral Daily  . feeding supplement (NEPRO CARB STEADY)  237 mL Oral BID BM  . heparin  5,000 Units Subcutaneous Q8H  . hydrALAZINE  25 mg Oral Q8H  . insulin aspart  0-15 Units Subcutaneous TID WC  . insulin aspart  0-5 Units Subcutaneous QHS  . multivitamin  1 tablet Oral QHS  . sodium chloride flush  3 mL Intravenous Q12H   Continuous Infusions: . sodium chloride    . furosemide (LASIX) infusion 4 mg/hr (03/23/18 0600)   PRN Meds:.sodium chloride, acetaminophen **OR** acetaminophen, hydrALAZINE, ondansetron **OR** ondansetron (ZOFRAN) IV, senna-docusate, sodium chloride flush, traMADol   Data Review:   Micro Results No results found for this or  any previous visit (from the past 240 hour(s)).  Radiology Reports Dg Chest 2 View  Result Date: 03/21/2018 CLINICAL DATA:  Shortness of breath and cold like symptoms. History of prostate cancer. EXAM: CHEST - 2 VIEW COMPARISON:  03/08/2007 FINDINGS: Mildly enlarged cardiac silhouette. Calcific atherosclerotic disease of the aorta. There is no evidence of pneumothorax. Small bilateral pleural effusions and mild pulmonary edema. Mild bilateral peribronchial opacities. Osseous structures are without acute abnormality. Soft tissues are grossly normal. IMPRESSION: Mild interstitial pulmonary edema with small bilateral pleural effusions. Probable superimposed peribronchial thickening may represent acute bronchitis. Electronically Signed   By: Fidela Salisbury M.D.   On: 03/21/2018 12:15   US Renal  Result Date: 03/21/2018 CLINICAL DATA:  Acute renal failure.  Prostate cancer. EXAM: RENAL / URINARY TRACT ULTRASOUND COMPLETE COMPARISON:  CT of the pelvis 08/04/2014 FINDINGS: Right Kidney: Length: 8.5 cm. The parenchyma is echogenic. No discrete lesions are present. Left Kidney: Length: 6.2 cm. The parenchyma is echogenic. A benign cyst in the midportion of the left kidney measures 1.7 x 1.7 x 1.6 cm. Bladder: Appears normal for degree of bladder distention. Bilateral pleural effusions are present. IMPRESSION: 1. Small echogenic kidneys compatible with nonspecific medical renal disease. 2. Benign cyst of the left kidney. 3. Bilateral pleural effusions. Electronically Signed   By: San Morelle M.D.   On: 03/21/2018 14:06     CBC Recent Labs  Lab 03/21/18 1044 03/22/18 0528  WBC 9.4 10.4  HGB 9.6* 9.1*  HCT 30.3* 27.8*  PLT 227 221  MCV 95.9 93.9  MCH 30.4 30.7  MCHC 31.7 32.7  RDW 15.9* 15.4    Chemistries  Recent Labs  Lab 03/21/18 1044 03/22/18 0528 03/23/18 0548  NA 139 140 141  K 4.8 5.0 4.8  CL 115* 113* 114*  CO2 14* 16* 17*  GLUCOSE 189* 110* 103*  BUN 76* 76* 83*   CREATININE 7.36* 6.80* 7.09*  CALCIUM 9.0 8.7* 8.8*   ------------------------------------------------------------------------------------------------------------------ estimated creatinine clearance is 9.5 mL/min (A) (by C-G formula based on SCr of 7.09 mg/dL (H)). ------------------------------------------------------------------------------------------------------------------ No results for input(s): HGBA1C in the last 72 hours. ------------------------------------------------------------------------------------------------------------------ No results for input(s): CHOL, HDL,  LDLCALC, TRIG, CHOLHDL, LDLDIRECT in the last 72 hours. ------------------------------------------------------------------------------------------------------------------ Recent Labs    03/21/18 1101  TSH 6.748*   ------------------------------------------------------------------------------------------------------------------ No results for input(s): VITAMINB12, FOLATE, FERRITIN, TIBC, IRON, RETICCTPCT in the last 72 hours.  Coagulation profile No results for input(s): INR, PROTIME in the last 168 hours.  No results for input(s): DDIMER in the last 72 hours.  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, diabetes mellitus type 2, hypertension, prostate cancer presented to the emergency room for shortness of breath   -Acute pulmonary edema and fluid overload Appreciate nephrology input Continue IV Lasix drip for diuresis No significant improvement in renal function plan for hemodialysis tomorrow  -Acute on chronic kidney disease stage IV Appreciate nephrology consult Hemodialysis tomorrow  -Dyspnea secondary to pulmonary edema Oxygen by  nasal cannula being weaned off  -Type 2 diabetes mellitus Diabetic diet with sliding scale coverage with insulin  -Prostate cancer Supportive care  -DVT prophylaxis subcu heparin  -Tobacco abuse       Code Status Orders  (From admission, onward)         Start     Ordered   03/21/18 1316  Full code  Continuous     03/21/18 1315        Code Status History    This patient has a current code status but no historical code status.           Consults  nephrology  DVT Prophylaxis  Lovenox    Lab Results  Component Value Date   PLT 221 03/22/2018     Time Spent in minutes   33min  Greater than 50% of time spent in care coordination and counseling patient regarding the condition and plan of care.   Dustin Flock M.D on 03/23/2018 at 2:53 PM  Between 7am to 6pm - Pager - [phone]  After 6pm go to [email or web address] - Proofreader  [employer]   Office  [phone]

## 2018-03-23 NOTE — Progress Notes (Signed)
Initial Nutrition Assessment  DOCUMENTATION CODES:   Non-severe (moderate) malnutrition in context of chronic illness  INTERVENTION:  Provide Nepro Shake po BID, each supplement provides 425 kcal and 19 grams protein.  Provide Rena-vite QHS.  NUTRITION DIAGNOSIS:   Moderate Malnutrition related to chronic illness(CKD) as evidenced by mild fat depletion, mild muscle depletion.  GOAL:   Patient will meet greater than or equal to 90% of their needs  MONITOR:   PO intake, Supplement acceptance, Labs, Weight trends, I & O's  REASON FOR ASSESSMENT:   Malnutrition Screening Tool    ASSESSMENT:   72 year old male with PMHx of prostate cancer, HTN, DM type 2, CKD IV, gout, depression admitted with acute pulmonary edema and fluid overload, acute on chronic kidney disease.   -Per Nephrology note patient has likely now progressed to ESRD. Plan is for placement of PermCath tomorrow and first dialysis treatment tomorrow.  Met with patient at bedside. He reports his appetite is "fairly good" now and was the same PTA. He eats 3 meals per day. He eats mainly vegetables at meals. He reports eating a "little" protein but is not able to provide specifics on usual intake. He denies any issues with chewing or swallowing. Patient is amenable to drinking ONS to help meet calorie/protein needs.  Patient reports his UBW was 185 lbs (84.1 kg). He is reporting he has lost 17 lbs over the past month. Last weight in chart was 83.3 kg from 02/07/2015 so unable to trend recent weights. He is currently 71.2 kg (157 lbs).  Meal Completion: 60-85%  Medications reviewed and include: Novolog 0-15 units TID, Novolog 0-5 units QHS, Lasix gtt.   Labs reviewed: CBG 62-108, Chloride 114, CO2 17, BUN 83, Creatinine 7.09. Potassium and Phosphorus are WNL.  NUTRITION - FOCUSED PHYSICAL EXAM:    Most Recent Value  Orbital Region  Mild depletion  Upper Arm Region  Moderate depletion  Thoracic and Lumbar Region  Mild  depletion  Buccal Region  Mild depletion  Temple Region  Mild depletion  Clavicle Bone Region  Mild depletion  Clavicle and Acromion Bone Region  Mild depletion  Scapular Bone Region  Mild depletion  Dorsal Hand  No depletion  Patellar Region  Unable to assess  Anterior Thigh Region  Unable to assess  Posterior Calf Region  Unable to assess  Edema (RD Assessment)  Moderate  Hair  Reviewed  Eyes  Reviewed  Mouth  Reviewed  Skin  Reviewed  Nails  Reviewed     Diet Order:   Diet Order            Diet renal/carb modified with fluid restriction Diet-HS Snack? Nothing; Fluid restriction: 1200 mL Fluid; Room service appropriate? Yes; Fluid consistency: Thin  Diet effective now              EDUCATION NEEDS:   No education needs have been identified at this time  Skin:  Skin Assessment: Reviewed RN Assessment  Last BM:  03/22/2018 - large type 6  Height:   Ht Readings from Last 1 Encounters:  03/21/18 '6\' 1"'$  (1.854 m)    Weight:   Wt Readings from Last 1 Encounters:  03/21/18 71.2 kg    Ideal Body Weight:  83.6 kg  BMI:  Body mass index is 20.71 kg/m.  Estimated Nutritional Needs:   Kcal:  1825-2130 (MSJ x 1.2-1.4)  Protein:  85-100 grams (1.2-1.4 grams/kg)  Fluid:  UOP + 1 L  Willey Blade, MS, RD, LDN Office:  [phone] Pager: [phone] After Hours/Weekend Pager: [phone]

## 2018-03-23 NOTE — Plan of Care (Signed)
  Problem: Education: Goal: Knowledge of General Education information will improve Description Including pain rating scale, medication(s)/side effects and non-pharmacologic comfort measures Outcome: Progressing   Problem: Health Behavior/Discharge Planning: Goal: Ability to manage health-related needs will improve Outcome: Progressing   Problem: Clinical Measurements: Goal: Ability to maintain clinical measurements within normal limits will improve Outcome: Progressing Goal: Will remain free from infection Outcome: Progressing Goal: Diagnostic test results will improve Outcome: Progressing Goal: Respiratory complications will improve Outcome: Progressing Goal: Cardiovascular complication will be avoided Outcome: Progressing   Problem: Activity: Goal: Risk for activity intolerance will decrease Outcome: Progressing   Problem: Nutrition: Goal: Adequate nutrition will be maintained Outcome: Progressing   Problem: Coping: Goal: Level of anxiety will decrease Outcome: Progressing   Problem: Elimination: Goal: Will not experience complications related to bowel motility Outcome: Progressing Goal: Will not experience complications related to urinary retention Outcome: Progressing   Problem: Skin Integrity: Goal: Risk for impaired skin integrity will decrease Outcome: Progressing   Problem: Education: Goal: Knowledge of disease and its progression will improve Outcome: Progressing Goal: Individualized Educational Video(s) Outcome: Progressing   Problem: Fluid Volume: Goal: Compliance with measures to maintain balanced fluid volume will improve Outcome: Progressing   Problem: Health Behavior/Discharge Planning: Goal: Ability to manage health-related needs will improve Outcome: Progressing   Problem: Nutritional: Goal: Ability to make healthy dietary choices will improve Outcome: Progressing   Problem: Clinical Measurements: Goal: Complications related to the  disease process, condition or treatment will be avoided or minimized Outcome: Progressing

## 2018-03-24 ENCOUNTER — Inpatient Hospital Stay
Admission: EM | Disposition: A | Discharge status: Home / Self Care | Payer: Self-pay | Source: Home / Self Care | Attending: Internal Medicine

## 2018-03-24 ENCOUNTER — Encounter
Admission: EM | Disposition: A | Discharge status: Home / Self Care | Payer: Self-pay | Source: Home / Self Care | Attending: Internal Medicine

## 2018-03-24 DIAGNOSIS — N186 End stage renal disease: Secondary | ICD-10-CM

## 2018-03-24 HISTORY — PX: DIALYSIS/PERMA CATHETER INSERTION: CATH118288

## 2018-03-24 LAB — RENAL FUNCTION PANEL
ALBUMIN: 3.3 g/dL — AB (ref 3.5–5.0)
Anion gap: 13 (ref 5–15)
BUN: 84 mg/dL — AB (ref 8–23)
CALCIUM: 8.6 mg/dL — AB (ref 8.9–10.3)
CO2: 17 mmol/L — ABNORMAL LOW (ref 22–32)
Chloride: 110 mmol/L (ref 98–111)
Creatinine, Ser: 7.29 mg/dL — ABNORMAL HIGH (ref 0.61–1.24)
GFR calc Af Amer: 8 mL/min — ABNORMAL LOW (ref >60)
GFR calc non Af Amer: 7 mL/min — ABNORMAL LOW (ref >60)
Glucose, Bld: 109 mg/dL — ABNORMAL HIGH (ref 70–99)
PHOSPHORUS: 4.2 mg/dL (ref 2.5–4.6)
POTASSIUM: 4.8 mmol/L (ref 3.5–5.1)
SODIUM: 140 mmol/L (ref 135–145)

## 2018-03-24 LAB — QUANTIFERON-TB GOLD PLUS (RQFGPL)
QuantiFERON Mitogen Value: 1.32 [IU]/mL
QuantiFERON Nil Value: 0.03 [IU]/mL
QuantiFERON TB1 Ag Value: 0.03 [IU]/mL
QuantiFERON TB2 Ag Value: 0.03 [IU]/mL

## 2018-03-24 LAB — QUANTIFERON-TB GOLD PLUS: QuantiFERON-TB Gold Plus: NEGATIVE

## 2018-03-24 LAB — GLUCOSE, CAPILLARY
Glucose-Capillary: 108 mg/dL — ABNORMAL HIGH (ref 70–99)
Glucose-Capillary: 106 mg/dL — ABNORMAL HIGH (ref 70–99)

## 2018-03-24 SURGERY — DIALYSIS/PERMA CATHETER INSERTION
Anesthesia: Moderate Sedation

## 2018-03-24 MED ORDER — MIDAZOLAM HCL 2 MG/2ML IJ SOLN
INTRAMUSCULAR | Status: DC | PRN
  Administered 2018-03-24: 1 mg via INTRAVENOUS
  Administered 2018-03-24: 2 mg via INTRAVENOUS

## 2018-03-24 MED ORDER — CHLORHEXIDINE GLUCONATE CLOTH 2 % EX PADS
6.0000 | MEDICATED_PAD | Freq: Every day | CUTANEOUS | Status: DC
  Administered 2018-03-24 – 2018-03-26 (×2): 6 via TOPICAL

## 2018-03-24 MED ORDER — CLINDAMYCIN PHOSPHATE 300 MG/50ML IV SOLN
INTRAVENOUS | Status: AC
  Administered 2018-03-24: 300 mg via INTRAVENOUS
  Filled 2018-03-24: qty 50

## 2018-03-24 MED ORDER — FENTANYL CITRATE (PF) 100 MCG/2ML IJ SOLN
INTRAMUSCULAR | Status: DC | PRN
  Administered 2018-03-24: 50 ug via INTRAVENOUS
  Administered 2018-03-24: 25 ug via INTRAVENOUS

## 2018-03-24 MED ORDER — CLINDAMYCIN PHOSPHATE 300 MG/50ML IV SOLN
300.0000 mg | Freq: Once | INTRAVENOUS | Status: AC
  Administered 2018-03-24: 300 mg via INTRAVENOUS
  Filled 2018-03-24: qty 50

## 2018-03-24 MED ORDER — SODIUM CHLORIDE 0.9 % IV SOLN
INTRAVENOUS | Status: DC
  Administered 2018-03-24: 16:00:00 via INTRAVENOUS

## 2018-03-24 MED ORDER — FENTANYL CITRATE (PF) 100 MCG/2ML IJ SOLN
INTRAMUSCULAR | Status: AC
  Filled 2018-03-24: qty 2

## 2018-03-24 MED ORDER — MIDAZOLAM HCL 5 MG/5ML IJ SOLN
INTRAMUSCULAR | Status: AC
  Filled 2018-03-24: qty 5

## 2018-03-24 MED ORDER — LIDOCAINE-EPINEPHRINE (PF) 1 %-1:200000 IJ SOLN
INTRAMUSCULAR | Status: AC
  Filled 2018-03-24: qty 30

## 2018-03-24 MED ORDER — HEPARIN SODIUM (PORCINE) 10000 UNIT/ML IJ SOLN
INTRAMUSCULAR | Status: AC
  Filled 2018-03-24: qty 1

## 2018-03-24 MED ORDER — HEPARIN (PORCINE) IN NACL 1000-0.9 UT/500ML-% IV SOLN
INTRAVENOUS | Status: AC
  Filled 2018-03-24: qty 500

## 2018-03-24 SURGICAL SUPPLY — 11 items
CATH PALINDROME RT-P 15FX19CM (CATHETERS) ×3 IMPLANT
COVER PROBE U/S 5X48 (MISCELLANEOUS) ×3 IMPLANT
DEVICE SAFEGUARD 24CM (GAUZE/BANDAGES/DRESSINGS) ×3 IMPLANT
NEEDLE ENTRY 21GA 7CM ECHOTIP (NEEDLE) ×3 IMPLANT
PACK ANGIOGRAPHY (CUSTOM PROCEDURE TRAY) ×3 IMPLANT
SET INTRO CAPELLA COAXIAL (SET/KITS/TRAYS/PACK) ×3 IMPLANT
SUT MNCRL 4-0 (SUTURE) ×2
SUT MNCRL 4-0 27XMFL (SUTURE) ×1
SUT SILK 0 FSL (SUTURE) ×3 IMPLANT
SUT VICRYL+ 3-0 36IN CT-1 (SUTURE) ×3 IMPLANT
SUTURE MNCRL 4-0 27XMF (SUTURE) ×1 IMPLANT

## 2018-03-24 NOTE — Progress Notes (Signed)
This note also relates to the following rowsHD Tx completed, tolerated well, pt denies complaints ok to D/C to floor    03/24/18 2100  Hand-Off documentation  Report given to (Full Name) April, RN   Report received from (Full Name) Beatris Ship, RN   Vital Signs  Temp 98.7 F (37.1 C)  Temp Source Oral  Pulse Rate Source Monitor  BP Location Left Arm  BP Method Automatic  Patient Position (if appropriate) Lying  Oxygen Therapy  O2 Device Room Air  During Hemodialysis Assessment  HD Safety Checks Performed Yes  KECN 23.1 KECN  Dialysis Fluid Bolus Normal Saline  Bolus Amount (mL) 250 mL  Intra-Hemodialysis Comments Tx completed;Tolerated well  Post-Hemodialysis Assessment  Rinseback Volume (mL) 250 mL  KECN 23.1 V  Dialyzer Clearance Lightly streaked  Duration of HD Treatment -hour(s) 2 hour(s)  Hemodialysis Intake (mL) 500 mL  UF Total -Machine (mL) 985 mL  Net UF (mL) 485 mL  Tolerated HD Treatment Yes  Hemodialysis Catheter Right Internal jugular  Placement Date/Time: 03/24/18 1658   Time Out: Correct patient;Correct site;Correct procedure  Maximum sterile barrier precautions: Hand hygiene;Sterile gloves;Cap;Large sterile sheet;Mask;Sterile gown  Site Prep: Chlorhexidine  Local Anesthetic: Inje...  Site Condition No complications  Blue Lumen Status Heparin locked  Red Lumen Status Heparin locked  Post treatment catheter status Capped and Clamped

## 2018-03-24 NOTE — Op Note (Signed)
OPERATIVE NOTE   PROCEDURE: 1. Insertion of tunneled dialysis catheter right IJ approach with ultrasound and fluoroscopic guidance.  PRE-OPERATIVE DIAGNOSIS: Acute on chronic renal insufficiency now end-stage  POST-OPERATIVE DIAGNOSIS: Same  SURGEON: Hortencia Pilar.  ANESTHESIA: Conscious sedation was administered under my direct supervision by the interventional radiology RN. IV Versed plus fentanyl were utilized. Continuous ECG, pulse oximetry and blood pressure was monitored throughout the entire procedure. Conscious sedation was for a total of 26 minutes.  ESTIMATED BLOOD LOSS: Minimal cc  CONTRAST USED:  None  FLUOROSCOPY TIME: 0.2 minutes  INDICATIONS:   Jose [name] a 72 y.o. y.o. male who presents with worsening renal function.  He is failed conservative therapies and is now in need of hemodialysis.  Risks and benefits of catheter placement were reviewed patient is agreed to proceed..  DESCRIPTION: After obtaining full informed written consent, the patient was positioned supine. The right neck and chest wall was prepped and draped in a sterile fashion. Ultrasound was placed in a sterile sleeve. Ultrasound was utilized to identify the right internal jugular vein which is noted to be echolucent and compressible indicating patency. Image is recorded for the permanent record. Under direct ultrasound visualization a micro-needle is inserted into the vein followed by the micro-wire. Micro-sheath was then advanced and a J wire is inserted without difficulty under fluoroscopic guidance. Small counterincision was made at the wire insertion site. Dilators are passed over the wire and the tunneled dialysis catheter is fed into the central venous system without difficulty.  Under fluoroscopy the catheter tip positioned at the atrial caval junction. The catheter is then approximated to the chest wall and an exit site selected. 1% lidocaine is infiltrated in soft tissues at this level small  incision is made and the tunneling device is then passed from the exit site to the neck counterincision. Catheter is then connected to the tunneling device and the catheter was pulled subcutaneously. It is then transected and the hub assembly connected without difficulty. Both lumens aspirate and flush easily. After verification of smooth contour with proper tip position under fluoroscopy the catheter is packed with 5000 units of heparin per lumen.  Catheter secured to the skin of the right chest wall with 0 silk. A sterile dressing is applied with a Biopatch.  COMPLICATIONS: None  CONDITION: Good  Hortencia Pilar [place] [facility]. Office:  [phone]   03/24/2018,5:30 PM

## 2018-03-24 NOTE — Progress Notes (Signed)
[place] at [facility]                                                                                                                                                                                  Patient Demographics   Jose [name], is a 72 y.o. male, DOB - 1946/01/08, MBW:[id number]  Admit date - 03/21/2018   Admitting Physician Saundra Shelling, MD  Outpatient Primary MD for the patient is [name], MD   LOS - 3  Subjective: Patient denies any complaints Renal function continues to be elevated   Review of Systems:   CONSTITUTIONAL: No documented fever. No fatigue, weakness. No weight gain, no weight loss.  EYES: No blurry or double vision.  ENT: No tinnitus. No postnasal drip. No redness of the oropharynx.  RESPIRATORY: No cough, no wheeze, no hemoptysis.  Positive dyspnea.  CARDIOVASCULAR: No chest pain. No orthopnea. No palpitations. No syncope.  GASTROINTESTINAL: No nausea, no vomiting or diarrhea. No abdominal pain. No melena or hematochezia.  GENITOURINARY: No dysuria or hematuria.  ENDOCRINE: No polyuria or nocturia. No heat or cold intolerance.  HEMATOLOGY: No anemia. No bruising. No bleeding.  INTEGUMENTARY: No rashes. No lesions.  MUSCULOSKELETAL: No arthritis. No swelling. No gout.  NEUROLOGIC: No numbness, tingling, or ataxia. No seizure-type activity.  PSYCHIATRIC: No anxiety. No insomnia. No ADD.    Vitals:   Vitals:   03/23/18 1441 03/23/18 1938 03/24/18 0358 03/24/18 1236  BP: (!) 157/76 (!) 150/61 (!) 161/89 (!) 166/76  Pulse: 77 82 85 83  Resp: 20 18 19 20   Temp: 98.3 F (36.8 C) 98.6 F (37 C) 98.1 F (36.7 C) 98.7 F (37.1 C)  TempSrc: Oral Oral Oral Oral  SpO2: 94% 92% 94% 95%  Weight:      Height:        Wt Readings from Last 3 Encounters:  03/21/18 71.2 kg  02/07/15 83.3 kg     Intake/Output Summary (Last 24 hours) at 03/24/2018 1321 Last data filed at 03/24/2018 0713 Gross per 24 hour  Intake  255.42 ml  Output 1300 ml  Net -1044.58 ml    Physical Exam:   GENERAL: Pleasant-appearing in no apparent distress.  HEAD, EYES, EARS, NOSE AND THROAT: Atraumatic, normocephalic. Extraocular muscles are intact. Pupils equal and reactive to light. Sclerae anicteric. No conjunctival injection. No oro-pharyngeal erythema.  NECK: Supple. There is no jugular venous distention. No bruits, no lymphadenopathy, no thyromegaly.  HEART: Regular rate and rhythm,. No murmurs, no rubs, no clicks.  LUNGS: Bilateral crackles throughout both lungs no sensory muscle usage ABDOMEN: Soft, flat, nontender, nondistended. Has good bowel sounds. No hepatosplenomegaly appreciated.  EXTREMITIES: No evidence  of any cyanosis, clubbing, or 1+ peripheral edema.  +2 pedal and radial pulses bilaterally.  NEUROLOGIC: The patient is alert, awake, and oriented x3 with no focal motor or sensory deficits appreciated bilaterally.  SKIN: Moist and warm with no rashes appreciated.  Psych: Not anxious, depressed LN: No inguinal LN enlargement    Antibiotics   Anti-infectives (From admission, onward)   None      Medications   Scheduled Meds: . amLODipine  10 mg Oral Daily  . aspirin EC  325 mg Oral Daily  . Chlorhexidine Gluconate Cloth  6 each Topical Q0600  . cloNIDine  0.2 mg Oral BID  . escitalopram  10 mg Oral Daily  . feeding supplement (NEPRO CARB STEADY)  237 mL Oral BID BM  . heparin  5,000 Units Subcutaneous Q8H  . hydrALAZINE  25 mg Oral Q8H  . insulin aspart  0-15 Units Subcutaneous TID WC  . insulin aspart  0-5 Units Subcutaneous QHS  . multivitamin  1 tablet Oral QHS  . sodium chloride flush  3 mL Intravenous Q12H   Continuous Infusions: . sodium chloride    . furosemide (LASIX) infusion 4 mg/hr (03/24/18 0051)   PRN Meds:.sodium chloride, acetaminophen **OR** acetaminophen, hydrALAZINE, ondansetron **OR** ondansetron (ZOFRAN) IV, senna-docusate, sodium chloride flush, traMADol   Data Review:    Micro Results No results found for this or any previous visit (from the past 240 hour(s)).  Radiology Reports Dg Chest 2 View  Result Date: 03/21/2018 CLINICAL DATA:  Shortness of breath and cold like symptoms. History of prostate cancer. EXAM: CHEST - 2 VIEW COMPARISON:  03/08/2007 FINDINGS: Mildly enlarged cardiac silhouette. Calcific atherosclerotic disease of the aorta. There is no evidence of pneumothorax. Small bilateral pleural effusions and mild pulmonary edema. Mild bilateral peribronchial opacities. Osseous structures are without acute abnormality. Soft tissues are grossly normal. IMPRESSION: Mild interstitial pulmonary edema with small bilateral pleural effusions. Probable superimposed peribronchial thickening may represent acute bronchitis. Electronically Signed   By: Fidela Salisbury M.D.   On: 03/21/2018 12:15   US Renal  Result Date: 03/21/2018 CLINICAL DATA:  Acute renal failure.  Prostate cancer. EXAM: RENAL / URINARY TRACT ULTRASOUND COMPLETE COMPARISON:  CT of the pelvis 08/04/2014 FINDINGS: Right Kidney: Length: 8.5 cm. The parenchyma is echogenic. No discrete lesions are present. Left Kidney: Length: 6.2 cm. The parenchyma is echogenic. A benign cyst in the midportion of the left kidney measures 1.7 x 1.7 x 1.6 cm. Bladder: Appears normal for degree of bladder distention. Bilateral pleural effusions are present. IMPRESSION: 1. Small echogenic kidneys compatible with nonspecific medical renal disease. 2. Benign cyst of the left kidney. 3. Bilateral pleural effusions. Electronically Signed   By: San Morelle M.D.   On: 03/21/2018 14:06     CBC Recent Labs  Lab 03/21/18 1044 03/22/18 0528  WBC 9.4 10.4  HGB 9.6* 9.1*  HCT 30.3* 27.8*  PLT 227 221  MCV 95.9 93.9  MCH 30.4 30.7  MCHC 31.7 32.7  RDW 15.9* 15.4    Chemistries  Recent Labs  Lab 03/21/18 1044 03/22/18 0528 03/23/18 0548 03/24/18 0948  NA 139 140 141 140  K 4.8 5.0 4.8 4.8  CL 115*  113* 114* 110  CO2 14* 16* 17* 17*  GLUCOSE 189* 110* 103* 109*  BUN 76* 76* 83* 84*  CREATININE 7.36* 6.80* 7.09* 7.29*  CALCIUM 9.0 8.7* 8.8* 8.6*   ------------------------------------------------------------------------------------------------------------------ estimated creatinine clearance is 9.2 mL/min (A) (by C-G formula based on SCr of 7.29 mg/dL (  H)). ------------------------------------------------------------------------------------------------------------------ No results for input(s): HGBA1C in the last 72 hours. ------------------------------------------------------------------------------------------------------------------ No results for input(s): CHOL, HDL, LDLCALC, TRIG, CHOLHDL, LDLDIRECT in the last 72 hours. ------------------------------------------------------------------------------------------------------------------ No results for input(s): TSH, T4TOTAL, T3FREE, THYROIDAB in the last 72 hours.  Invalid input(s): FREET3 ------------------------------------------------------------------------------------------------------------------ No results for input(s): VITAMINB12, FOLATE, FERRITIN, TIBC, IRON, RETICCTPCT in the last 72 hours.  Coagulation profile No results for input(s): INR, PROTIME in the last 168 hours.  No results for input(s): DDIMER in the last 72 hours.  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, diabetes mellitus type 2, hypertension, prostate cancer presented to the emergency room for shortness of breath   -Acute pulmonary edema and fluid overload Appreciate nephrology input Plan for temporary hemodialysis catheter today subsequent hemodialysis  -Acute on chronic kidney disease  stage IV Appreciate nephrology consult Hemodialysis later today  -Dyspnea secondary to pulmonary edema Oxygen by nasal cannula being weaned off  -Type 2 diabetes mellitus Diabetic diet with sliding scale coverage with insulin  -Prostate cancer Supportive care  -DVT prophylaxis subcu heparin  -Tobacco abuse       Code Status Orders  (From admission, onward)         Start     Ordered   03/21/18 1316  Full code  Continuous     03/21/18 1315        Code Status History    This patient has a current code status but no historical code status.           Consults  nephrology  DVT Prophylaxis  Lovenox    Lab Results  Component Value Date   PLT 221 03/22/2018     Time Spent in minutes   4min  Greater than 50% of time spent in care coordination and counseling patient regarding the condition and plan of care.   Dustin Flock M.D on 03/24/2018 at 1:21 PM  Between 7am to 6pm - Pager - [phone]  After 6pm go to [email or web address] - Proofreader  [employer]   Office  [phone]

## 2018-03-24 NOTE — Progress Notes (Addendum)
[facility]  ROUNDING NOTE   Subjective:   The patient reports that his shortness of breath has improved. He reports that he is still interested in initiating hemodialysis. He does not have any questions at this time. He has been a patient at [facility] nephrology and had an appointment there " about a month ago."  He denies any recent gout flares.   Objective:  Vital signs in last 24 hours:  Temp:  [98.1 F (36.7 C)-99.3 F (37.4 C)] 98.1 F (36.7 C) (10/23 0358) Pulse Rate:  [77-85] 85 (10/23 0358) Resp:  [17-20] 19 (10/23 0358) BP: (150-166)/(61-89) 161/89 (10/23 0358) SpO2:  [92 %-96 %] 94 % (10/23 0358)  Weight change:  Filed Weights   03/21/18 1041  Weight: 71.2 kg    Intake/Output: I/O last 3 completed shifts: In: 478.4 [P.O.:300; I.V.:178.4] Out: 2750 [Urine:2750]   Intake/Output this shift:  Total I/O In: -  Out: 750 [Urine:750]  Physical Exam: General: NAD,   Head: Normocephalic, atraumatic. Moist oral mucosal membranes  Eyes: Anicteric, PERRL  Neck: Supple, trachea midline  Lungs:  +Crackles  Heart: Regular rate and rhythm  Abdomen:  Soft, nontender,   Extremities:  1+ peripheral edema.  Neurologic: Nonfocal, moving all four extremities  Skin: No lesions  Access: None     Basic Metabolic Panel: Recent Labs  Lab 03/21/18 1044 03/21/18 1443 03/22/18 0528 03/23/18 0548  NA 139  --  140 141  K 4.8  --  5.0 4.8  CL 115*  --  113* 114*  CO2 14*  --  16* 17*  GLUCOSE 189*  --  110* 103*  BUN 76*  --  76* 83*  CREATININE 7.36*  --  6.80* 7.09*  CALCIUM 9.0  --  8.7* 8.8*  PHOS  --  3.9 4.4 3.7    Liver Function Tests: Recent Labs  Lab 03/22/18 0528 03/23/18 0548  ALBUMIN 3.4* 3.3*   No results for input(s): LIPASE, AMYLASE in the last 168 hours. No results for input(s): AMMONIA in the last 168 hours.  CBC: Recent Labs  Lab 03/21/18 1044 03/22/18 0528  WBC 9.4 10.4  HGB 9.6* 9.1*  HCT 30.3* 27.8*  MCV 95.9 93.9  PLT 227 221     Cardiac Enzymes: Recent Labs  Lab 03/21/18 1044  TROPONINI <0.03    BNP: Invalid input(s): POCBNP  CBG: Recent Labs  Lab 03/23/18 0739 03/23/18 1154 03/23/18 1644 03/23/18 2107 03/24/18 0751  GLUCAP 102* 122* 145* 95 108*    Microbiology: No results found for this or any previous visit.  Coagulation Studies: No results for input(s): LABPROT, INR in the last 72 hours.  Urinalysis: No results for input(s): COLORURINE, LABSPEC, PHURINE, GLUCOSEU, HGBUR, BILIRUBINUR, KETONESUR, PROTEINUR, UROBILINOGEN, NITRITE, LEUKOCYTESUR in the last 72 hours.  Invalid input(s): APPERANCEUR    Imaging: No results found.   Medications:   . sodium chloride    . furosemide (LASIX) infusion 4 mg/hr (03/24/18 0051)   . amLODipine  10 mg Oral Daily  . aspirin EC  325 mg Oral Daily  . cloNIDine  0.2 mg Oral BID  . escitalopram  10 mg Oral Daily  . feeding supplement (NEPRO CARB STEADY)  237 mL Oral BID BM  . heparin  5,000 Units Subcutaneous Q8H  . hydrALAZINE  25 mg Oral Q8H  . insulin aspart  0-15 Units Subcutaneous TID WC  . insulin aspart  0-5 Units Subcutaneous QHS  . multivitamin  1 tablet Oral QHS  . sodium  chloride flush  3 mL Intravenous Q12H   sodium chloride, acetaminophen **OR** acetaminophen, hydrALAZINE, ondansetron **OR** ondansetron (ZOFRAN) IV, senna-docusate, sodium chloride flush, traMADol  Assessment/ Plan:  [name] is a 72 y.o. african [name] male with chronic kidney disease stage IV, hypertension, gout, depression, diabetes mellitus type II, history of prostate cancer who was admitted to [facility] on 03/21/2018 for Acute kidney failure.   1. Acute renal failure on chronic kidney disease stage IV with proteinuria has a base line GFR of 18.  - The patient is agreeable to initiate hemodialysis - Plan for Permcath placement today by Vascular, patient is NPO.  - Plan for dialysis after placement.  - Creatinine of 7.29, increased from previous, GFR  of 8.  - Discontinue Lasix drip.   2. Anemia of chronic kidney disease - Hgb 9.1 on 10/21.   3. Hypertension  - continue current medication regiment of amlodipine, clonindine, hydralazine.   4. Hypoalbuminemia  - albumin of 3.3 on 10/22.  - Currently receiving nepro supplementation    LOS: 3 10/23/201910:05 AM  [name] PA-C   Patient seen and examined with physician assistant. Agree with above plan.   [name], [facility]  10/23/20194:59 PM

## 2018-03-24 NOTE — Care Management Important Message (Signed)
Important Message  Patient Details  Name: Jose [name] MRN: [id number] Date of Birth: Oct 17, 1945   Medicare Important Message Given:  Yes    Juliann Pulse A [place] 03/24/2018, 12:57 PM

## 2018-03-24 NOTE — Progress Notes (Signed)
Post HD Assessment   03/24/18 2108  Neurological  Level of Consciousness Responds to Voice  Respiratory  Respiratory Pattern Regular  Chest Assessment Chest expansion symmetrical  Bilateral Breath Sounds Diminished  Cough None  Cardiac  Cardiac Rhythm NSR  Ectopy Unifocal PVC's  Ectopy Frequency Frequent  Vascular  RLE Edema +2  LLE Edema +2  Psychosocial  Psychosocial (WDL) WDL

## 2018-03-25 ENCOUNTER — Encounter: Payer: Self-pay | Admitting: Vascular Surgery

## 2018-03-25 LAB — GLUCOSE, CAPILLARY
GLUCOSE-CAPILLARY: 90 mg/dL (ref 70–99)
Glucose-Capillary: 109 mg/dL — ABNORMAL HIGH (ref 70–99)
Glucose-Capillary: 130 mg/dL — ABNORMAL HIGH (ref 70–99)
Glucose-Capillary: 187 mg/dL — ABNORMAL HIGH (ref 70–99)
Glucose-Capillary: 78 mg/dL (ref 70–99)

## 2018-03-25 LAB — CBC
HEMATOCRIT: 27.9 % — AB (ref 39.0–52.0)
Hemoglobin: 9.1 g/dL — ABNORMAL LOW (ref 13.0–17.0)
MCH: 30.4 pg (ref 26.0–34.0)
MCHC: 32.6 g/dL (ref 30.0–36.0)
MCV: 93.3 fL (ref 80.0–100.0)
NRBC: 0 % (ref 0.0–0.2)
Platelets: 226 10*3/uL (ref 150–400)
RBC: 2.99 MIL/uL — AB (ref 4.22–5.81)
RDW: 15.1 % (ref 11.5–15.5)
WBC: 9.6 10*3/uL (ref 4.0–10.5)

## 2018-03-25 LAB — URINALYSIS, ROUTINE W REFLEX MICROSCOPIC
BACTERIA UA: NONE SEEN
Bilirubin Urine: NEGATIVE
Glucose, UA: NEGATIVE mg/dL
Hgb urine dipstick: NEGATIVE
KETONES UR: NEGATIVE mg/dL
LEUKOCYTES UA: NEGATIVE
Nitrite: NEGATIVE
PH: 7 (ref 5.0–8.0)
PROTEIN: 100 mg/dL — AB
Specific Gravity, Urine: 1.011 (ref 1.005–1.030)

## 2018-03-25 LAB — RENAL FUNCTION PANEL
Albumin: 3.1 g/dL — ABNORMAL LOW (ref 3.5–5.0)
Anion gap: 10 (ref 5–15)
BUN: 67 mg/dL — ABNORMAL HIGH (ref 8–23)
CHLORIDE: 107 mmol/L (ref 98–111)
CO2: 23 mmol/L (ref 22–32)
CREATININE: 6.16 mg/dL — AB (ref 0.61–1.24)
Calcium: 8.5 mg/dL — ABNORMAL LOW (ref 8.9–10.3)
GFR calc Af Amer: 9 mL/min — ABNORMAL LOW (ref >60)
GFR calc non Af Amer: 8 mL/min — ABNORMAL LOW (ref >60)
GLUCOSE: 89 mg/dL (ref 70–99)
Phosphorus: 3.8 mg/dL (ref 2.5–4.6)
Potassium: 4.3 mmol/L (ref 3.5–5.1)
Sodium: 140 mmol/L (ref 135–145)

## 2018-03-25 LAB — PROTEIN / CREATININE RATIO, URINE
Creatinine, Urine: 113 mg/dL
PROTEIN CREATININE RATIO: 1.3 mg/mg{creat} — AB (ref 0.00–0.15)
TOTAL PROTEIN, URINE: 147 mg/dL

## 2018-03-25 LAB — PHOSPHORUS: Phosphorus: 3.8 mg/dL (ref 2.5–4.6)

## 2018-03-25 MED ORDER — ALTEPLASE 2 MG IJ SOLR
2.0000 mg | Freq: Once | INTRAMUSCULAR | Status: DC | PRN
  Filled 2018-03-25: qty 2

## 2018-03-25 MED ORDER — SODIUM CHLORIDE 0.9 % IV SOLN: 100.0000 mL | INTRAVENOUS | Status: DC | PRN

## 2018-03-25 MED ORDER — LIDOCAINE-PRILOCAINE 2.5-2.5 % EX CREA
1.0000 "application " | TOPICAL_CREAM | CUTANEOUS | Status: DC | PRN
  Filled 2018-03-25: qty 5

## 2018-03-25 MED ORDER — PENTAFLUOROPROP-TETRAFLUOROETH EX AERO
1.0000 "application " | INHALATION_SPRAY | CUTANEOUS | Status: DC | PRN
  Filled 2018-03-25: qty 30

## 2018-03-25 MED ORDER — HEPARIN SODIUM (PORCINE) 1000 UNIT/ML DIALYSIS
1000.0000 [IU] | INTRAMUSCULAR | Status: DC | PRN
  Filled 2018-03-25: qty 1

## 2018-03-25 MED ORDER — LIDOCAINE HCL (PF) 1 % IJ SOLN
5.0000 mL | INTRAMUSCULAR | Status: DC | PRN
  Filled 2018-03-25: qty 5

## 2018-03-25 MED ORDER — CHLORHEXIDINE GLUCONATE CLOTH 2 % EX PADS: 6.0000 | MEDICATED_PAD | Freq: Every day | CUTANEOUS | Status: DC

## 2018-03-25 NOTE — Progress Notes (Signed)
[place] at [facility]                                                                                                                                                                                  Patient Demographics   Jose [name], is a 72 y.o. male, DOB - 1946/04/28, JOI:[id number]  Admit date - 03/21/2018   Admitting Physician Saundra Shelling, MD  Outpatient Primary MD for the patient is [name], MD   LOS - 4  Subjective: Patient had first hemodialysis yesterday shortness of breath resolved  Review of Systems:   CONSTITUTIONAL: No documented fever. No fatigue, weakness. No weight gain, no weight loss.  EYES: No blurry or double vision.  ENT: No tinnitus. No postnasal drip. No redness of the oropharynx.  RESPIRATORY: No cough, no wheeze, no hemoptysis.  Resolved dyspnea.  CARDIOVASCULAR: No chest pain. No orthopnea. No palpitations. No syncope.  GASTROINTESTINAL: No nausea, no vomiting or diarrhea. No abdominal pain. No melena or hematochezia.  GENITOURINARY: No dysuria or hematuria.  ENDOCRINE: No polyuria or nocturia. No heat or cold intolerance.  HEMATOLOGY: No anemia. No bruising. No bleeding.  INTEGUMENTARY: No rashes. No lesions.  MUSCULOSKELETAL: No arthritis. No swelling. No gout.  NEUROLOGIC: No numbness, tingling, or ataxia. No seizure-type activity.  PSYCHIATRIC: No anxiety. No insomnia. No ADD.    Vitals:   Vitals:   03/25/18 0141 03/25/18 0402 03/25/18 0643 03/25/18 0959  BP: 102/82 (!) 144/85 (!) 148/90 (!) 169/62  Pulse: (!) 104 77  78  Resp:    18  Temp: (!) 97.5 F (36.4 C) 98 F (36.7 C)  99.6 F (37.6 C)  TempSrc: Oral Oral  Oral  SpO2:  96%  98%  Weight:      Height:        Wt Readings from Last 3 Encounters:  03/24/18 71.8 kg  02/07/15 83.3 kg     Intake/Output Summary (Last 24 hours) at 03/25/2018 1303 Last data filed at 03/25/2018 1259 Gross per 24 hour  Intake 384.37 ml  Output 2120 ml  Net  -1735.63 ml    Physical Exam:   GENERAL: Pleasant-appearing in no apparent distress.  HEAD, EYES, EARS, NOSE AND THROAT: Atraumatic, normocephalic. Extraocular muscles are intact. Pupils equal and reactive to light. Sclerae anicteric. No conjunctival injection. No oro-pharyngeal erythema.  NECK: Supple. There is no jugular venous distention. No bruits, no lymphadenopathy, no thyromegaly.  HEART: Regular rate and rhythm,. No murmurs, no rubs, no clicks.  LUNGS: Bilateral crackles throughout both lungs no sensory muscle usage ABDOMEN: Soft, flat, nontender, nondistended. Has good bowel sounds. No hepatosplenomegaly appreciated.  EXTREMITIES: No evidence of any cyanosis, clubbing,  or 1+ peripheral edema.  +2 pedal and radial pulses bilaterally.  NEUROLOGIC: The patient is alert, awake, and oriented x3 with no focal motor or sensory deficits appreciated bilaterally.  SKIN: Moist and warm with no rashes appreciated.  Psych: Not anxious, depressed LN: No inguinal LN enlargement    Antibiotics   Anti-infectives (From admission, onward)   Start     Dose/Rate Route Frequency Ordered Stop   03/24/18 1445  clindamycin (CLEOCIN) IVPB 300 mg     300 mg 100 mL/hr over 30 Minutes Intravenous  Once 03/24/18 1440 03/24/18 1803      Medications   Scheduled Meds: . amLODipine  10 mg Oral Daily  . aspirin EC  325 mg Oral Daily  . Chlorhexidine Gluconate Cloth  6 each Topical Q0600  . cloNIDine  0.2 mg Oral BID  . escitalopram  10 mg Oral Daily  . feeding supplement (NEPRO CARB STEADY)  237 mL Oral BID BM  . heparin  5,000 Units Subcutaneous Q8H  . hydrALAZINE  25 mg Oral Q8H  . insulin aspart  0-15 Units Subcutaneous TID WC  . insulin aspart  0-5 Units Subcutaneous QHS  . multivitamin  1 tablet Oral QHS  . sodium chloride flush  3 mL Intravenous Q12H   Continuous Infusions: . sodium chloride     PRN Meds:.sodium chloride, acetaminophen **OR** acetaminophen, hydrALAZINE, ondansetron **OR**  ondansetron (ZOFRAN) IV, senna-docusate, sodium chloride flush, traMADol   Data Review:   Micro Results No results found for this or any previous visit (from the past 240 hour(s)).  Radiology Reports Dg Chest 2 View  Result Date: 03/21/2018 CLINICAL DATA:  Shortness of breath and cold like symptoms. History of prostate cancer. EXAM: CHEST - 2 VIEW COMPARISON:  03/08/2007 FINDINGS: Mildly enlarged cardiac silhouette. Calcific atherosclerotic disease of the aorta. There is no evidence of pneumothorax. Small bilateral pleural effusions and mild pulmonary edema. Mild bilateral peribronchial opacities. Osseous structures are without acute abnormality. Soft tissues are grossly normal. IMPRESSION: Mild interstitial pulmonary edema with small bilateral pleural effusions. Probable superimposed peribronchial thickening may represent acute bronchitis. Electronically Signed   By: Fidela Salisbury M.D.   On: 03/21/2018 12:15   US Renal  Result Date: 03/21/2018 CLINICAL DATA:  Acute renal failure.  Prostate cancer. EXAM: RENAL / URINARY TRACT ULTRASOUND COMPLETE COMPARISON:  CT of the pelvis 08/04/2014 FINDINGS: Right Kidney: Length: 8.5 cm. The parenchyma is echogenic. No discrete lesions are present. Left Kidney: Length: 6.2 cm. The parenchyma is echogenic. A benign cyst in the midportion of the left kidney measures 1.7 x 1.7 x 1.6 cm. Bladder: Appears normal for degree of bladder distention. Bilateral pleural effusions are present. IMPRESSION: 1. Small echogenic kidneys compatible with nonspecific medical renal disease. 2. Benign cyst of the left kidney. 3. Bilateral pleural effusions. Electronically Signed   By: San Morelle M.D.   On: 03/21/2018 14:06     CBC Recent Labs  Lab 03/21/18 1044 03/22/18 0528  WBC 9.4 10.4  HGB 9.6* 9.1*  HCT 30.3* 27.8*  PLT 227 221  MCV 95.9 93.9  MCH 30.4 30.7  MCHC 31.7 32.7  RDW 15.9* 15.4    Chemistries  Recent Labs  Lab 03/21/18 1044  03/22/18 0528 03/23/18 0548 03/24/18 0948  NA 139 140 141 140  K 4.8 5.0 4.8 4.8  CL 115* 113* 114* 110  CO2 14* 16* 17* 17*  GLUCOSE 189* 110* 103* 109*  BUN 76* 76* 83* 84*  CREATININE 7.36* 6.80* 7.09* 7.29*  CALCIUM 9.0 8.7* 8.8* 8.6*   ------------------------------------------------------------------------------------------------------------------ estimated creatinine clearance is 9.3 mL/min (A) (by C-G formula based on SCr of 7.29 mg/dL (H)). ------------------------------------------------------------------------------------------------------------------ No results for input(s): HGBA1C in the last 72 hours. ------------------------------------------------------------------------------------------------------------------ No results for input(s): CHOL, HDL, LDLCALC, TRIG, CHOLHDL, LDLDIRECT in the last 72 hours. ------------------------------------------------------------------------------------------------------------------ No results for input(s): TSH, T4TOTAL, T3FREE, THYROIDAB in the last 72 hours.  Invalid input(s): FREET3 ------------------------------------------------------------------------------------------------------------------ No results for input(s): VITAMINB12, FOLATE, FERRITIN, TIBC, IRON, RETICCTPCT in the last 72 hours.  Coagulation profile No results for input(s): INR, PROTIME in the last 168 hours.  No results for input(s): DDIMER in the last 72 hours.  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, diabetes mellitus type 2, hypertension, prostate cancer presented to the emergency room for shortness of breath   -Acute pulmonary edema and fluid overload Appreciate nephrology input [facility]  yesterday, Alysis per nephrology  -Acute on chronic kidney disease stage IV Appreciate nephrology consult Hemodialysis per nephrology  -Dyspnea secondary to pulmonary edema Oxygen by nasal cannula being weaned off  -Type 2 diabetes mellitus Diabetic diet with sliding scale coverage with insulin  -Prostate cancer Supportive care  -DVT prophylaxis subcu heparin  -Tobacco abuse       Code Status Orders  (From admission, onward)         Start     Ordered   03/21/18 1316  Full code  Continuous     03/21/18 1315        Code Status History    This patient has a current code status but no historical code status.           Consults  nephrology  DVT Prophylaxis  Lovenox    Lab Results  Component Value Date   PLT 221 03/22/2018     Time Spent in minutes   21min  Greater than 50% of time spent in care coordination and counseling patient regarding the condition and plan of care.   Dustin Flock M.D on 03/25/2018 at 1:03 PM  Between 7am to 6pm - Pager - [phone]  After 6pm go to [email or web address] - Proofreader  [employer]   Office  [phone]

## 2018-03-25 NOTE — Progress Notes (Signed)
Pre HD assessment. Vascular was called, message was left, for a consult regarding bleeding at new CVC HD access, placed yesterday.   03/25/18 1755  Vital Signs  Temp 98.7 F (37.1 C)  Temp Source Oral  Pulse Rate 86  Pulse Rate Source Monitor  Resp 14  BP (!) 155/62  BP Location Right Arm  BP Method Automatic  Patient Position (if appropriate) Lying  Oxygen Therapy  SpO2 95 %  O2 Device Room Air  Pain Assessment  Pain Scale 0-10  Pain Score 0  Dialysis Weight  Weight 68.7 kg  Type of Weight Pre-Dialysis  Time-Out for Hemodialysis  What Procedure? HD  Pt Identifiers(min of two) First/Last Name;MRN/Account#  Correct Site? Yes  Correct Side? Yes  Correct Procedure? Yes  Consents Verified? Yes  Rad Studies Available? N/A  Safety Precautions Reviewed? Yes  Engineer, civil (consulting) Number  (5A)  Station Number 1  UF/Alarm Test Passed  Conductivity: Meter 13.6  Conductivity: Machine  13.7  pH 7.6  Reverse Osmosis main  Normal Saline Lot Number [id number]  Dialyzer Lot Number [id number]  Disposable Set Lot Number [id number]  Machine Temperature 98.6 F (37 C)  Musician and Audible Yes  Blood Lines Intact and Secured Yes  Pre Treatment Patient Checks  Vascular access used during treatment Catheter  Hepatitis B Surface Antigen Results Negative  Date Hepatitis B Surface Antigen Drawn 03/22/18  Hepatitis B Surface Antibody  (>10)  Date Hepatitis B Surface Antibody Drawn 03/22/18  Hemodialysis Consent Verified Yes  Hemodialysis Standing Orders Initiated Yes  ECG (Telemetry) Monitor On Yes  Prime Ordered Normal Saline  Length of  DialysisTreatment -hour(s) 2.5 Hour(s)  Dialyzer Elisio 17H NR  Dialysate 3K, 2.5 Ca  Dialysis Anticoagulant None  Dialysate Flow Ordered 500  Blood Flow Rate Ordered 250 mL/min  Ultrafiltration Goal 0.5 Liters  Pre Treatment Labs Renal panel;CBC;Phosphorus (PTH)  Dialysis Blood Pressure Support Ordered Normal Saline  Education / Care Plan   Dialysis Education Provided Yes  Documented Education in Care Plan Yes  Hemodialysis Catheter Right Internal jugular  Placement Date/Time: 03/24/18 1658   Time Out: Correct patient;Correct site;Correct procedure  Maximum sterile barrier precautions: Hand hygiene;Sterile gloves;Cap;Large sterile sheet;Mask;Sterile gown  Site Prep: Chlorhexidine  Local Anesthetic: Inje...  Site Condition Bleeding  Blue Lumen Status Heparin locked  Red Lumen Status Heparin locked  Purple Lumen Status N/A  Dressing Type Gauze/Drain sponge  Dressing Status New drainage;Old drainage  Interventions Removed;New dressing  Drainage Description Serosanguineous  Dressing Change Due 04/01/18

## 2018-03-25 NOTE — Progress Notes (Signed)
HD tx end    03/25/18 2050  Vital Signs  Pulse Rate 80  Pulse Rate Source Monitor  Resp 18  BP (!) 161/88  BP Location Right Arm  BP Method Automatic  Patient Position (if appropriate) Lying  Oxygen Therapy  SpO2 97 %  O2 Device Room Air  During Hemodialysis Assessment  Dialysis Fluid Bolus Normal Saline  Bolus Amount (mL) 250 mL  Intra-Hemodialysis Comments Tx completed

## 2018-03-25 NOTE — Progress Notes (Signed)
Post HD assessment. PT tolerated tx well without c/o or complication. Net UF 506, goal met.    03/25/18 2058  Vital Signs  Temp 98.8 F (37.1 C)  Temp Source Oral  Pulse Rate 79  Pulse Rate Source Monitor  Resp 12  BP (!) 161/78  BP Location Right Arm  BP Method Automatic  Patient Position (if appropriate) Lying  Oxygen Therapy  SpO2 95 %  O2 Device Room Air  Dialysis Weight  Weight 68.4 kg  Type of Weight Post-Dialysis  Post-Hemodialysis Assessment  Rinseback Volume (mL) 250 mL  KECN 37 V  Dialyzer Clearance Lightly streaked  Duration of HD Treatment -hour(s) 2.5 hour(s)  Hemodialysis Intake (mL) 500 mL  UF Total -Machine (mL) 1006 mL  Net UF (mL) 506 mL  Tolerated HD Treatment Yes  Education / Care Plan  Dialysis Education Provided Yes  Documented Education in Care Plan Yes  Hemodialysis Catheter Right Internal jugular  Placement Date/Time: 03/24/18 1658   Time Out: Correct patient;Correct site;Correct procedure  Maximum sterile barrier precautions: Hand hygiene;Sterile gloves;Cap;Large sterile sheet;Mask;Sterile gown  Site Prep: Chlorhexidine  Local Anesthetic: Inje...  Site Condition No complications  Blue Lumen Status Heparin locked  Red Lumen Status Heparin locked  Purple Lumen Status N/A  Catheter fill solution Heparin 1000 units/ml  Catheter fill volume (Arterial) 1.5 cc  Catheter fill volume (Venous) 1.5  Dressing Type Biopatch  Dressing Status Dressing changed  Interventions New dressing  Drainage Description None  Dressing Change Due 04/01/18  Post treatment catheter status Capped and Clamped

## 2018-03-25 NOTE — Progress Notes (Signed)
Post HD assessment    03/25/18 2057  Neurological  Level of Consciousness Alert  Orientation Level Disoriented to time  Respiratory  Respiratory Pattern Regular;Unlabored  Chest Assessment Chest expansion symmetrical  Cardiac  ECG Monitor Yes  Cardiac Rhythm Atrial fibrillation  Ectopy Multifocal PVC's  Ectopy Frequency Frequent  Vascular  R Radial Pulse +2  L Radial Pulse +2  Edema Right lower extremity;Left lower extremity  Integumentary  Integumentary (WDL) X  Skin Color Appropriate for ethnicity  Musculoskeletal  Musculoskeletal (WDL) X  Generalized Weakness Yes  Assistive Device None  GU Assessment  Genitourinary (WDL) X  Genitourinary Symptoms  (HD)  Psychosocial  Psychosocial (WDL) WDL

## 2018-03-25 NOTE — Progress Notes (Signed)
Pre HD assessment    03/25/18 1756  Neurological  Level of Consciousness Alert  Orientation Level Oriented to person;Oriented to place;Oriented to situation;Disoriented to time  Respiratory  Respiratory Pattern Regular;Unlabored  Chest Assessment Chest expansion symmetrical  Cardiac  ECG Monitor Yes  Cardiac Rhythm Atrial fibrillation  Vascular  R Radial Pulse +2  L Radial Pulse +2  Edema Right lower extremity;Left lower extremity  Integumentary  Integumentary (WDL) X  Skin Color Appropriate for ethnicity  Musculoskeletal  Musculoskeletal (WDL) X  Generalized Weakness Yes  Assistive Device None  GU Assessment  Genitourinary (WDL) X  Genitourinary Symptoms  (HD)  Psychosocial  Psychosocial (WDL) WDL

## 2018-03-25 NOTE — Progress Notes (Signed)
HD tx start    03/25/18 1812  Vital Signs  Pulse Rate 81  Pulse Rate Source Monitor  Resp 17  BP (!) 149/83  BP Location Right Arm  BP Method Automatic  Patient Position (if appropriate) Lying  Oxygen Therapy  SpO2 96 %  O2 Device Room Air  During Hemodialysis Assessment  Blood Flow Rate (mL/min) 250 mL/min  Arterial Pressure (mmHg) -110 mmHg  Venous Pressure (mmHg) 70 mmHg  Transmembrane Pressure (mmHg) 70 mmHg  Ultrafiltration Rate (mL/min) 400 mL/min  Dialysate Flow Rate (mL/min) 500 ml/min  Conductivity: Machine  13.7  HD Safety Checks Performed Yes  Dialysis Fluid Bolus Normal Saline  Bolus Amount (mL) 250 mL  Intra-Hemodialysis Comments Tx initiated  Hemodialysis Catheter Right Internal jugular  Placement Date/Time: 03/24/18 1658   Time Out: Correct patient;Correct site;Correct procedure  Maximum sterile barrier precautions: Hand hygiene;Sterile gloves;Cap;Large sterile sheet;Mask;Sterile gown  Site Prep: Chlorhexidine  Local Anesthetic: Inje...  Blue Lumen Status Infusing  Red Lumen Status Infusing

## 2018-03-25 NOTE — Progress Notes (Signed)
[facility]  ROUNDING NOTE   Subjective:  Pt due for second dialysis treatment.  Overall starting to feel somewhat better.    Objective:  Vital signs in last 24 hours:  Temp:  [97.4 F (36.3 C)-99.6 F (37.6 C)] 98.3 F (36.8 C) (10/24 1414) Pulse Rate:  [45-104] 81 (10/24 1414) Resp:  [8-22] 18 (10/24 1414) BP: (102-187)/(62-103) 150/77 (10/24 1414) SpO2:  [93 %-99 %] 99 % (10/24 1414) Weight:  [71.8 kg] 71.8 kg (10/23 1820)  Weight change:  Filed Weights   03/21/18 1041 03/24/18 1820  Weight: 71.2 kg 71.8 kg    Intake/Output: I/O last 3 completed shifts: In: 415.8 [P.O.:250; I.V.:165.8] Out: 2470 [Urine:1985; Other:485]   Intake/Output this shift:  Total I/O In: -  Out: 425 [Urine:425]  Physical Exam: General: NAD, resting in bed  Head: Normocephalic, atraumatic. Moist oral mucosal membranes  Eyes: Anicteric  Neck: Supple, trachea midline  Lungs:  Basilar rales, normal effort  Heart: Regular rate and rhythm  Abdomen:  Soft, nontender, BS present  Extremities: 1+ peripheral edema.  Neurologic: Nonfocal, moving all four extremities  Skin: No lesions  Access: R IJ permcath    Basic Metabolic Panel: Recent Labs  Lab 03/21/18 1044 03/21/18 1443 03/22/18 0528 03/23/18 0548 03/24/18 0948  NA 139  --  140 141 140  K 4.8  --  5.0 4.8 4.8  CL 115*  --  113* 114* 110  CO2 14*  --  16* 17* 17*  GLUCOSE 189*  --  110* 103* 109*  BUN 76*  --  76* 83* 84*  CREATININE 7.36*  --  6.80* 7.09* 7.29*  CALCIUM 9.0  --  8.7* 8.8* 8.6*  PHOS  --  3.9 4.4 3.7 4.2    Liver Function Tests: Recent Labs  Lab 03/22/18 0528 03/23/18 0548 03/24/18 0948  ALBUMIN 3.4* 3.3* 3.3*   No results for input(s): LIPASE, AMYLASE in the last 168 hours. No results for input(s): AMMONIA in the last 168 hours.  CBC: Recent Labs  Lab 03/21/18 1044 03/22/18 0528  WBC 9.4 10.4  HGB 9.6* 9.1*  HCT 30.3* 27.8*  MCV 95.9 93.9  PLT 227 221    Cardiac  Enzymes: Recent Labs  Lab 03/21/18 1044  TROPONINI <0.03    BNP: Invalid input(s): POCBNP  CBG: Recent Labs  Lab 03/24/18 0751 03/24/18 1151 03/25/18 0029 03/25/18 0734 03/25/18 1151  GLUCAP 108* 106* 130* 109* 187*    Microbiology: No results found for this or any previous visit.  Coagulation Studies: No results for input(s): LABPROT, INR in the last 72 hours.  Urinalysis: No results for input(s): COLORURINE, LABSPEC, PHURINE, GLUCOSEU, HGBUR, BILIRUBINUR, KETONESUR, PROTEINUR, UROBILINOGEN, NITRITE, LEUKOCYTESUR in the last 72 hours.  Invalid input(s): APPERANCEUR    Imaging: No results found.   Medications:   . sodium chloride    . sodium chloride    . sodium chloride     . amLODipine  10 mg Oral Daily  . aspirin EC  325 mg Oral Daily  . Chlorhexidine Gluconate Cloth  6 each Topical Q0600  . [START ON 03/26/2018] Chlorhexidine Gluconate Cloth  6 each Topical Q0600  . cloNIDine  0.2 mg Oral BID  . escitalopram  10 mg Oral Daily  . feeding supplement (NEPRO CARB STEADY)  237 mL Oral BID BM  . heparin  5,000 Units Subcutaneous Q8H  . hydrALAZINE  25 mg Oral Q8H  . insulin aspart  0-15 Units Subcutaneous TID WC  . insulin  aspart  0-5 Units Subcutaneous QHS  . multivitamin  1 tablet Oral QHS  . sodium chloride flush  3 mL Intravenous Q12H   sodium chloride, sodium chloride, sodium chloride, acetaminophen **OR** acetaminophen, alteplase, heparin, hydrALAZINE, lidocaine (PF), lidocaine-prilocaine, ondansetron **OR** ondansetron (ZOFRAN) IV, pentafluoroprop-tetrafluoroeth, senna-docusate, sodium chloride flush, traMADol  Assessment/ Plan:  [name] is a 72 y.o. african [name] male with chronic kidney disease stage IV, hypertension, gout, depression, diabetes mellitus type II, history of prostate cancer who was admitted to [facility] on 03/21/2018 for Acute kidney failure.   1. End stage renal disease.  - patient is tolerated initiation of dialysis  quite well.  He was previously followed by [facility] nephrology. However per his request he asked that we follow him now locally.  Patient requests admission at [place] and we will coordinate this.  Second else treatment today.  2. Anemia of chronic kidney disease - Continue to monitor hgb as an outpt.    3. Hypertension  - Blood pressure should improved with volume control, continue Amlodipine, clonidine, hydralazine.  4. Hypoalbuminemia  - albumin of 3.3 on 10/22.  - Currently receiving nepro supplementation    LOS: 4 10/24/20193:48 PM  [name] PA-C   Patient seen and examined with physician assistant. Agree with above plan.   [name], [facility]  10/24/20193:48 PM

## 2018-03-26 LAB — CBC
HEMATOCRIT: 26.9 % — AB (ref 39.0–52.0)
Hemoglobin: 8.9 g/dL — ABNORMAL LOW (ref 13.0–17.0)
MCH: 30.8 pg (ref 26.0–34.0)
MCHC: 33.1 g/dL (ref 30.0–36.0)
MCV: 93.1 fL (ref 80.0–100.0)
Platelets: 212 10*3/uL (ref 150–400)
RBC: 2.89 MIL/uL — ABNORMAL LOW (ref 4.22–5.81)
RDW: 14.9 % (ref 11.5–15.5)
WBC: 8.9 10*3/uL (ref 4.0–10.5)
nRBC: 0 % (ref 0.0–0.2)

## 2018-03-26 LAB — PHOSPHORUS: PHOSPHORUS: 3 mg/dL (ref 2.5–4.6)

## 2018-03-26 LAB — GLUCOSE, CAPILLARY
Glucose-Capillary: 103 mg/dL — ABNORMAL HIGH (ref 70–99)
Glucose-Capillary: 123 mg/dL — ABNORMAL HIGH (ref 70–99)
Glucose-Capillary: 175 mg/dL — ABNORMAL HIGH (ref 70–99)
Glucose-Capillary: 91 mg/dL (ref 70–99)

## 2018-03-26 LAB — PARATHYROID HORMONE, INTACT (NO CA): PTH: 226 pg/mL — ABNORMAL HIGH (ref 15–65)

## 2018-03-26 NOTE — Progress Notes (Signed)
Pre HD Assessment    03/26/18 0950  Neurological  Level of Consciousness Alert  Orientation Level Oriented X4  Respiratory  Respiratory Pattern Regular;Labored  Chest Assessment Chest expansion symmetrical  Bilateral Breath Sounds Clear  Cough Weak  Cardiac  Pulse Regular  Heart Sounds S1, S2  Jugular Venous Distention (JVD) No  ECG Monitor Yes  Cardiac Rhythm NSR  Ectopy Multifocal PVC's  Ectopy Frequency Frequent  Antiarrhythmic device No  Vascular  R Radial Pulse +2  L Radial Pulse +2  R Dorsalis Pedis Pulse +2  L Dorsalis Pedis Pulse +2  Edema Right lower extremity  RLE Edema +2  LLE Edema +2  Integumentary  Integumentary (WDL) WDL  Skin Color Appropriate for ethnicity  Skin Condition Dry  Skin Integrity Intact  Musculoskeletal  Musculoskeletal (WDL) WDL  GU Assessment  Genitourinary (WDL) X (HD pt)  Psychosocial  Psychosocial (WDL) WDL

## 2018-03-26 NOTE — Progress Notes (Signed)
HD Treatment Complete    03/26/18 1300  Vital Signs  Pulse Rate (!) 53  Pulse Rate Source Monitor  Resp 20  BP (!) 179/100  BP Location Right Arm  BP Method Automatic  Patient Position (if appropriate) Lying  Oxygen Therapy  SpO2 99 %  O2 Device Room Air  During Hemodialysis Assessment  Blood Flow Rate (mL/min) 300 mL/min  Arterial Pressure (mmHg) -160 mmHg  Venous Pressure (mmHg) 100 mmHg  Transmembrane Pressure (mmHg) 60 mmHg  Ultrafiltration Rate (mL/min) 500 mL/min  Dialysate Flow Rate (mL/min) 600 ml/min  Conductivity: Machine  13.6  HD Safety Checks Performed Yes  Intra-Hemodialysis Comments Progressing as prescribed;Tx completed (UF 1502)  Hemodialysis Catheter Right Internal jugular  Placement Date/Time: 03/24/18 1658   Time Out: Correct patient;Correct site;Correct procedure  Maximum sterile barrier precautions: Hand hygiene;Sterile gloves;Cap;Large sterile sheet;Mask;Sterile gown  Site Prep: Chlorhexidine  Local Anesthetic: Inje...  Blue Lumen Status Heparin locked  Red Lumen Status Heparin locked  Purple Lumen Status N/A  Catheter fill solution Heparin 1000 units/ml  Catheter fill volume (Arterial) 1.5 cc  Catheter fill volume (Venous) 1.5  Dressing Type Biopatch;Occlusive  Dressing Status Clean;Dry;Intact

## 2018-03-26 NOTE — Progress Notes (Signed)
HD Treatment Initiated    03/26/18 0955  Vital Signs  Pulse Rate 81  Pulse Rate Source Monitor  Resp (!) 21  Oxygen Therapy  SpO2 96 %  O2 Device Room Air  During Hemodialysis Assessment  Blood Flow Rate (mL/min) 300 mL/min  Arterial Pressure (mmHg) -130 mmHg  Venous Pressure (mmHg) 90 mmHg  Transmembrane Pressure (mmHg) 70 mmHg  Ultrafiltration Rate (mL/min) 500 mL/min  Dialysate Flow Rate (mL/min) 600 ml/min  Conductivity: Machine  13.7  HD Safety Checks Performed Yes  Dialysis Fluid Bolus Normal Saline  Bolus Amount (mL) 250 mL  Intra-Hemodialysis Comments Tx initiated  Hemodialysis Catheter Right Internal jugular  Placement Date/Time: 03/24/18 1658   Time Out: Correct patient;Correct site;Correct procedure  Maximum sterile barrier precautions: Hand hygiene;Sterile gloves;Cap;Large sterile sheet;Mask;Sterile gown  Site Prep: Chlorhexidine  Local Anesthetic: Inje...  Blue Lumen Status Infusing  Red Lumen Status Infusing  Purple Lumen Status N/A

## 2018-03-26 NOTE — Progress Notes (Signed)
[place] at [facility]                                                                                                                                                                                  Patient Demographics   Jose [name], is a 72 y.o. male, DOB - 10/01/1945, FUX:[id number]  Admit date - 03/21/2018   Admitting Physician Saundra Shelling, MD  Outpatient Primary MD for the patient is [name], MD   LOS - 5  Subjective: Patient denies any symptoms have hemodialysis again  Review of Systems:   CONSTITUTIONAL: No documented fever. No fatigue, weakness. No weight gain, no weight loss.  EYES: No blurry or double vision.  ENT: No tinnitus. No postnasal drip. No redness of the oropharynx.  RESPIRATORY: No cough, no wheeze, no hemoptysis.  Resolved dyspnea.  CARDIOVASCULAR: No chest pain. No orthopnea. No palpitations. No syncope.  GASTROINTESTINAL: No nausea, no vomiting or diarrhea. No abdominal pain. No melena or hematochezia.  GENITOURINARY: No dysuria or hematuria.  ENDOCRINE: No polyuria or nocturia. No heat or cold intolerance.  HEMATOLOGY: No anemia. No bruising. No bleeding.  INTEGUMENTARY: No rashes. No lesions.  MUSCULOSKELETAL: No arthritis. No swelling. No gout.  NEUROLOGIC: No numbness, tingling, or ataxia. No seizure-type activity.  PSYCHIATRIC: No anxiety. No insomnia. No ADD.    Vitals:   Vitals:   03/26/18 1300 03/26/18 1305 03/26/18 1315 03/26/18 1353  BP: (!) 179/100 (!) 158/92 (!) 162/88 (!) 150/81  Pulse: (!) 53 89 98 72  Resp: 20 (!) 22 (!) 27 20  Temp:   98.7 F (37.1 C) 97.8 F (36.6 C)  TempSrc:   Oral Oral  SpO2: 99% 98% 98% 99%  Weight:   65.1 kg   Height:        Wt Readings from Last 3 Encounters:  03/26/18 65.1 kg  02/07/15 83.3 kg     Intake/Output Summary (Last 24 hours) at 03/26/2018 1556 Last data filed at 03/26/2018 1315 Gross per 24 hour  Intake -  Output 1509 ml  Net -1509 ml     Physical Exam:   GENERAL: Pleasant-appearing in no apparent distress.  HEAD, EYES, EARS, NOSE AND THROAT: Atraumatic, normocephalic. Extraocular muscles are intact. Pupils equal and reactive to light. Sclerae anicteric. No conjunctival injection. No oro-pharyngeal erythema.  NECK: Supple. There is no jugular venous distention. No bruits, no lymphadenopathy, no thyromegaly.  HEART: Regular rate and rhythm,. No murmurs, no rubs, no clicks.  LUNGS: Bilateral crackles throughout both lungs no sensory muscle usage ABDOMEN: Soft, flat, nontender, nondistended. Has good bowel sounds. No hepatosplenomegaly appreciated.  EXTREMITIES: No evidence of any cyanosis, clubbing, or 1+ peripheral  edema.  +2 pedal and radial pulses bilaterally.  NEUROLOGIC: The patient is alert, awake, and oriented x3 with no focal motor or sensory deficits appreciated bilaterally.  SKIN: Moist and warm with no rashes appreciated.  Psych: Not anxious, depressed LN: No inguinal LN enlargement    Antibiotics   Anti-infectives (From admission, onward)   Start     Dose/Rate Route Frequency Ordered Stop   03/24/18 1445  clindamycin (CLEOCIN) IVPB 300 mg     300 mg 100 mL/hr over 30 Minutes Intravenous  Once 03/24/18 1440 03/24/18 1803      Medications   Scheduled Meds: . amLODipine  10 mg Oral Daily  . aspirin EC  325 mg Oral Daily  . Chlorhexidine Gluconate Cloth  6 each Topical Q0600  . cloNIDine  0.2 mg Oral BID  . escitalopram  10 mg Oral Daily  . feeding supplement (NEPRO CARB STEADY)  237 mL Oral BID BM  . heparin  5,000 Units Subcutaneous Q8H  . hydrALAZINE  25 mg Oral Q8H  . insulin aspart  0-15 Units Subcutaneous TID WC  . insulin aspart  0-5 Units Subcutaneous QHS  . multivitamin  1 tablet Oral QHS  . sodium chloride flush  3 mL Intravenous Q12H   Continuous Infusions: . sodium chloride     PRN Meds:.sodium chloride, acetaminophen **OR** acetaminophen, hydrALAZINE, ondansetron **OR** ondansetron  (ZOFRAN) IV, senna-docusate, sodium chloride flush, traMADol   Data Review:   Micro Results No results found for this or any previous visit (from the past 240 hour(s)).  Radiology Reports Dg Chest 2 View  Result Date: 03/21/2018 CLINICAL DATA:  Shortness of breath and cold like symptoms. History of prostate cancer. EXAM: CHEST - 2 VIEW COMPARISON:  03/08/2007 FINDINGS: Mildly enlarged cardiac silhouette. Calcific atherosclerotic disease of the aorta. There is no evidence of pneumothorax. Small bilateral pleural effusions and mild pulmonary edema. Mild bilateral peribronchial opacities. Osseous structures are without acute abnormality. Soft tissues are grossly normal. IMPRESSION: Mild interstitial pulmonary edema with small bilateral pleural effusions. Probable superimposed peribronchial thickening may represent acute bronchitis. Electronically Signed   By: Fidela Salisbury M.D.   On: 03/21/2018 12:15   US Renal  Result Date: 03/21/2018 CLINICAL DATA:  Acute renal failure.  Prostate cancer. EXAM: RENAL / URINARY TRACT ULTRASOUND COMPLETE COMPARISON:  CT of the pelvis 08/04/2014 FINDINGS: Right Kidney: Length: 8.5 cm. The parenchyma is echogenic. No discrete lesions are present. Left Kidney: Length: 6.2 cm. The parenchyma is echogenic. A benign cyst in the midportion of the left kidney measures 1.7 x 1.7 x 1.6 cm. Bladder: Appears normal for degree of bladder distention. Bilateral pleural effusions are present. IMPRESSION: 1. Small echogenic kidneys compatible with nonspecific medical renal disease. 2. Benign cyst of the left kidney. 3. Bilateral pleural effusions. Electronically Signed   By: San Morelle M.D.   On: 03/21/2018 14:06     CBC Recent Labs  Lab 03/21/18 1044 03/22/18 0528 03/25/18 1557 03/26/18 0611  WBC 9.4 10.4 9.6 8.9  HGB 9.6* 9.1* 9.1* 8.9*  HCT 30.3* 27.8* 27.9* 26.9*  PLT 227 221 226 212  MCV 95.9 93.9 93.3 93.1  MCH 30.4 30.7 30.4 30.8  MCHC 31.7 32.7  32.6 33.1  RDW 15.9* 15.4 15.1 14.9    Chemistries  Recent Labs  Lab 03/21/18 1044 03/22/18 0528 03/23/18 0548 03/24/18 0948 03/25/18 1557  NA 139 140 141 140 140  K 4.8 5.0 4.8 4.8 4.3  CL 115* 113* 114* 110 107  CO2 14*  16* 17* 17* 23  GLUCOSE 189* 110* 103* 109* 89  BUN 76* 76* 83* 84* 67*  CREATININE 7.36* 6.80* 7.09* 7.29* 6.16*  CALCIUM 9.0 8.7* 8.8* 8.6* 8.5*   ------------------------------------------------------------------------------------------------------------------ estimated creatinine clearance is 10 mL/min (A) (by C-G formula based on SCr of 6.16 mg/dL (H)). ------------------------------------------------------------------------------------------------------------------ No results for input(s): HGBA1C in the last 72 hours. ------------------------------------------------------------------------------------------------------------------ No results for input(s): CHOL, HDL, LDLCALC, TRIG, CHOLHDL, LDLDIRECT in the last 72 hours. ------------------------------------------------------------------------------------------------------------------ No results for input(s): TSH, T4TOTAL, T3FREE, THYROIDAB in the last 72 hours.  Invalid input(s): FREET3 ------------------------------------------------------------------------------------------------------------------ No results for input(s): VITAMINB12, FOLATE, FERRITIN, TIBC, IRON, RETICCTPCT in the last 72 hours.  Coagulation profile No results for input(s): INR, PROTIME in the last 168 hours.  No results for input(s): DDIMER in the last 72 hours.  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, diabetes mellitus type 2, hypertension, prostate cancer presented  to the emergency room for shortness of breath   -Acute pulmonary edema and fluid overload this is related to progressive renal disease Appreciate nephrology input Discussed with nephrology patient will be able to be discharged home post hemodialysis tomorrow, with the hemodialysis spot for Tuesday Thursday Saturday  -Acute on chronic kidney disease stage IV Appreciate nephrology consult Hemodialysis per nephrology  -Dyspnea secondary to pulmonary edema Oxygen by nasal cannula being weaned off  -Type 2 diabetes mellitus Diabetic diet with sliding scale coverage with insulin  -Hypertension blood pressure intermittently elevated continue Norvasc continue hydralazine if needed hydralazine dose can be adjusted on discharge  -Prostate cancer Supportive care  -DVT prophylaxis subcu heparin  -Tobacco abuse patient counseled regarding smoking       Code Status Orders  (From admission, onward)         Start     Ordered   03/21/18 1316  Full code  Continuous     03/21/18 1315        Code Status History    This patient has a current code status but no historical code status.           Consults  nephrology  DVT Prophylaxis  Lovenox    Lab Results  Component Value Date   PLT 212 03/26/2018     Time Spent in minutes   41min  Greater than 50% of time spent in care coordination and counseling patient regarding the condition and plan of care.   Dustin Flock M.D on 03/26/2018 at 3:56 PM  Between 7am to 6pm - Pager - [phone]  After 6pm go to [email or web address] - Proofreader  [employer]   Office  [phone]

## 2018-03-26 NOTE — Care Management (Signed)
CM informed that it is anticipated that patient will discharge today.  His wife died  the day before admission and he needs to plan the funeral.  He received his third dialysis treatment today and was able to sit without difficulty for the duration of his 3 hours treatment.  Spoke with [name] with [facility] [employer] and the coordination of clinic and chair time has not been completed.  QuantiFERON-TB Gold Plus  Order: [id number]  Status:  Final result Visible to patient:  No (Not Released) Next appt:  None   Ref Range & Units 4d ago  QuantiFERON Incubation  Incubation performed.   QuantiFERON-TB Gold Plus Negative Negative   Comment: (NOTE)  Performed At: [facility]  [place], [place] [id number]  Rush Farmer MD DI:[id number]   Resulting Agency  [facility]      Specimen Collected: 03/22/18 05:28 Last Resulted: 03/24/18 12:36      Lab Flowsheet     Order Details     View Encounter     Lab and Collection Details     Routing     Result History

## 2018-03-26 NOTE — Care Management Important Message (Signed)
Important Message  Patient Details  Name: Jose [name] MRN: [id number] Date of Birth: [birth year]   Medicare Important Message Given:  Yes    Juliann Pulse A Shamiya Demeritt 03/26/2018, 11:06 AM

## 2018-03-26 NOTE — Progress Notes (Signed)
Post HD Treatment  Pt tolerated treatment well. His net UF was 1003 and BVP was 51.9. All blood was returned to patient and report called to Gerald Stabs, Primary RN.     03/26/18 1315  Hand-Off documentation  Report given to (Full Name) Andre Lefort, RN  Report received from (Full Name) Stephannie Peters, RN  Vital Signs  Temp 98.7 F (37.1 C)  Temp Source Oral  Pulse Rate 98  Pulse Rate Source Monitor  Resp (!) 27  BP (!) 162/88  BP Location Right Arm  BP Method Automatic  Patient Position (if appropriate) Lying  Oxygen Therapy  SpO2 98 %  O2 Device Room Air  Pain Assessment  Pain Scale 0-10  Pain Score 0  Dialysis Weight  Weight 65.1 kg  Type of Weight Post-Dialysis  Post-Hemodialysis Assessment  Rinseback Volume (mL) 250 mL  KECN 221 V  Dialyzer Clearance Lightly streaked  Duration of HD Treatment -hour(s) 3 hour(s)  Hemodialysis Intake (mL) 500 mL  UF Total -Machine (mL) 1503 mL  Net UF (mL) 1003 mL  Tolerated HD Treatment Yes  Post-Hemodialysis Comments Pt tolerated treatment well  Hemodialysis Catheter Right Internal jugular  Placement Date/Time: 03/24/18 1658   Time Out: Correct patient;Correct site;Correct procedure  Maximum sterile barrier precautions: Hand hygiene;Sterile gloves;Cap;Large sterile sheet;Mask;Sterile gown  Site Prep: Chlorhexidine  Local Anesthetic: Inje...  Post treatment catheter status Capped and Clamped

## 2018-03-26 NOTE — Progress Notes (Signed)
Pre HD Treatment    03/26/18 0945  Vital Signs  Temp 99.5 F (37.5 C)  Temp Source Oral  Pulse Rate 81  Pulse Rate Source Monitor  Resp (!) 6  BP (!) 151/74  BP Location Right Arm  BP Method Automatic  Patient Position (if appropriate) Sitting  Oxygen Therapy  SpO2 97 %  O2 Device Room Air  Pain Assessment  Pain Scale 0-10  Pain Score 0  Dialysis Weight  Weight 67.9 kg  Type of Weight Pre-Dialysis  Time-Out for Hemodialysis  What Procedure? HD  Pt Identifiers(min of two) First/Last Name;MRN/Account#;Pt's DOB(use if MRN/Acct# not available  Correct Site? Yes  Correct Side? Yes  Correct Procedure? Yes  Consents Verified? Yes  Rad Studies Available? N/A  Safety Precautions Reviewed? Yes  Engineer, civil (consulting) Number 5  Station Number 1  UF/Alarm Test Passed  Conductivity: Meter 14  Conductivity: Machine  13.9  pH 7.6  Reverse Osmosis Main  Normal Saline Lot Number [id number]  Dialyzer Lot Number [id number]  Disposable Set Lot Number [id number]  Machine Temperature 98.6 F (37 C)  Musician and Audible Yes  Blood Lines Intact and Secured Yes  Pre Treatment Patient Checks  Vascular access used during treatment Catheter  Patient is receiving dialysis in a chair Yes  Hepatitis B Surface Antigen Results Negative  Date Hepatitis B Surface Antigen Drawn 03/22/18  Hepatitis B Surface Antibody  (>10)  Date Hepatitis B Surface Antibody Drawn 03/22/18  Hemodialysis Consent Verified Yes  Hemodialysis Standing Orders Initiated Yes  ECG (Telemetry) Monitor On Yes  Prime Ordered Normal Saline  Length of  DialysisTreatment -hour(s) 3 Hour(s)  Dialysis Treatment Comments 3rd treatment  Dialyzer Elisio 17H NR  Dialysate 3K, 2.5 Ca  Dialysis Anticoagulant None  Dialysate Flow Ordered 600  Blood Flow Rate Ordered 300 mL/min  Ultrafiltration Goal 1 Liters  Pre Treatment Labs Phosphorus  Dialysis Blood Pressure Support Ordered Normal Saline  Education / Care Plan   Dialysis Education Provided Yes  Documented Education in Care Plan Yes  Hemodialysis Catheter Right Internal jugular  Placement Date/Time: 03/24/18 1658   Time Out: Correct patient;Correct site;Correct procedure  Maximum sterile barrier precautions: Hand hygiene;Sterile gloves;Cap;Large sterile sheet;Mask;Sterile gown  Site Prep: Chlorhexidine  Local Anesthetic: Inje...  Site Condition Drainage  Blue Lumen Status Capped (Central line);Blood return noted  Red Lumen Status Capped (Central line);Blood return noted  Dressing Type Biopatch;Occlusive  Dressing Status Clean;Dry;Intact;Old drainage  Dressing Change Due 04/01/18

## 2018-03-26 NOTE — Progress Notes (Signed)
[facility]  ROUNDING NOTE   Subjective:  Patient completed his second dialysis treatment today. Due for third also treatment for her.  Objective:  Vital signs in last 24 hours:  Temp:  [97.8 F (36.6 C)-99.5 F (37.5 C)] 97.8 F (36.6 C) (10/25 1353) Pulse Rate:  [53-98] 72 (10/25 1353) Resp:  [6-27] 20 (10/25 1353) BP: (128-183)/(52-100) 150/81 (10/25 1353) SpO2:  [94 %-100 %] 99 % (10/25 1353) Weight:  [65.1 kg-68.7 kg] 65.1 kg (10/25 1315)  Weight change: -3.1 kg Filed Weights   03/25/18 2058 03/26/18 0945 03/26/18 1315  Weight: 68.4 kg 67.9 kg 65.1 kg    Intake/Output: I/O last 3 completed shifts: In: 321.2 [P.O.:250; I.V.:71.2] Out: 1551 [Urine:560; Other:991]   Intake/Output this shift:  Total I/O In: -  Out: 1003 [Other:1003]  Physical Exam: General: NAD  Head: Normocephalic, atraumatic. Moist oral mucosal membranes  Eyes: Anicteric  Neck: Supple, trachea midline  Lungs:  CTAB, normal effort  Heart: Regular rate and rhythm  Abdomen:  Soft, nontender, BS present  Extremities: trace peripheral edema.  Neurologic: Nonfocal, moving all four extremities  Skin: No lesions  Access: R IJ permcath    Basic Metabolic Panel: Recent Labs  Lab 03/21/18 1044  03/22/18 0528 03/23/18 0548 03/24/18 0948 03/25/18 1557 03/26/18 1021  NA 139  --  140 141 140 140  --   K 4.8  --  5.0 4.8 4.8 4.3  --   CL 115*  --  113* 114* 110 107  --   CO2 14*  --  16* 17* 17* 23  --   GLUCOSE 189*  --  110* 103* 109* 89  --   BUN 76*  --  76* 83* 84* 67*  --   CREATININE 7.36*  --  6.80* 7.09* 7.29* 6.16*  --   CALCIUM 9.0  --  8.7* 8.8* 8.6* 8.5*  --   PHOS  --    < > 4.4 3.7 4.2 3.8  3.8 3.0   < > = values in this interval not displayed.    Liver Function Tests: Recent Labs  Lab 03/22/18 0528 03/23/18 0548 03/24/18 0948 03/25/18 1557  ALBUMIN 3.4* 3.3* 3.3* 3.1*   No results for input(s): LIPASE, AMYLASE in the last 168 hours. No results for  input(s): AMMONIA in the last 168 hours.  CBC: Recent Labs  Lab 03/21/18 1044 03/22/18 0528 03/25/18 1557 03/26/18 0611  WBC 9.4 10.4 9.6 8.9  HGB 9.6* 9.1* 9.1* 8.9*  HCT 30.3* 27.8* 27.9* 26.9*  MCV 95.9 93.9 93.3 93.1  PLT 227 221 226 212    Cardiac Enzymes: Recent Labs  Lab 03/21/18 1044  TROPONINI <0.03    BNP: Invalid input(s): POCBNP  CBG: Recent Labs  Lab 03/25/18 1151 03/25/18 1637 03/25/18 2212 03/26/18 0759 03/26/18 1352  GLUCAP 187* 78 90 103* 123*    Microbiology: No results found for this or any previous visit.  Coagulation Studies: No results for input(s): LABPROT, INR in the last 72 hours.  Urinalysis: No results for input(s): COLORURINE, LABSPEC, PHURINE, GLUCOSEU, HGBUR, BILIRUBINUR, KETONESUR, PROTEINUR, UROBILINOGEN, NITRITE, LEUKOCYTESUR in the last 72 hours.  Invalid input(s): APPERANCEUR    Imaging: No results found.   Medications:   . sodium chloride     . amLODipine  10 mg Oral Daily  . aspirin EC  325 mg Oral Daily  . Chlorhexidine Gluconate Cloth  6 each Topical Q0600  . cloNIDine  0.2 mg Oral BID  . escitalopram  10 mg Oral Daily  . feeding supplement (NEPRO CARB STEADY)  237 mL Oral BID BM  . heparin  5,000 Units Subcutaneous Q8H  . hydrALAZINE  25 mg Oral Q8H  . insulin aspart  0-15 Units Subcutaneous TID WC  . insulin aspart  0-5 Units Subcutaneous QHS  . multivitamin  1 tablet Oral QHS  . sodium chloride flush  3 mL Intravenous Q12H   sodium chloride, acetaminophen **OR** acetaminophen, hydrALAZINE, ondansetron **OR** ondansetron (ZOFRAN) IV, senna-docusate, sodium chloride flush, traMADol  Assessment/ Plan:  [name] is a 72 y.o. african [name] male with chronic kidney disease stage IV, hypertension, gout, depression, diabetes mellitus type II, history of prostate cancer who was admitted to [facility] on 03/21/2018 for Acute kidney failure.   1. End stage renal disease.  - patient is tolerated  initiation of dialysis quite well.  He was previously followed by [facility] nephrology. However per his request he asked that we follow him now locally.  -  Patient completed a second ulcer treatment.  We will plan for third also treatment tomorrow.  Outpatient hemodialysis placement is complete.  2. Anemia of chronic kidney disease - hemoglobin currently 8.9.  Start the patient on Epogen as an outpatient.  3. Hypertension  - continue amlodipine, hydralazine, clonidine.  4. Hypoalbuminemia  - albumin of 3.3 on 10/22.  - Currently receiving nepro supplementation   5.  Secondary hyperparathyroidism.  PTH currently to 26 with a phosphorus of 3.0.  Continue to monitor bone mineral metabolism parameters.   LOS: 5 10/25/20193:44 PM  [name] PA-C   Patient seen and examined with physician assistant. Agree with above plan.   [name], MD [facility]  10/25/20193:44 PM

## 2018-03-26 NOTE — Progress Notes (Signed)
Post HD Assessment    03/26/18 1310  Neurological  Level of Consciousness Alert  Orientation Level Oriented X4  Respiratory  Respiratory Pattern Regular;Unlabored;Symmetrical  Chest Assessment Chest expansion symmetrical  Bilateral Breath Sounds Clear  Cardiac  Pulse Regular  Heart Sounds S1, S2  Jugular Venous Distention (JVD) No  ECG Monitor Yes  Cardiac Rhythm NSR  Ectopy Multifocal PVC's  Ectopy Frequency Frequent  Antiarrhythmic device No  Vascular  R Radial Pulse +2  L Radial Pulse +2  R Dorsalis Pedis Pulse +2  L Dorsalis Pedis Pulse +2  Edema Right lower extremity  RLE Edema +2  LLE Edema +2  Integumentary  Integumentary (WDL) WDL  Skin Color Appropriate for ethnicity  Skin Condition Dry  Skin Integrity Intact  Musculoskeletal  Musculoskeletal (WDL) WDL  GU Assessment  Genitourinary (WDL) X (HD pt)  Psychosocial  Psychosocial (WDL) WDL

## 2018-03-26 NOTE — Care Management (Signed)
Patient has been assigned to [facility] TTS 12:00.  He may discharge home 10/26 after dialysis.  He can start at the clinic on Tuesday

## 2018-03-27 DIAGNOSIS — E440 Moderate protein-calorie malnutrition: Secondary | ICD-10-CM

## 2018-03-27 LAB — PHOSPHORUS: PHOSPHORUS: 2.3 mg/dL — AB (ref 2.5–4.6)

## 2018-03-27 LAB — GLUCOSE, CAPILLARY
Glucose-Capillary: 105 mg/dL — ABNORMAL HIGH (ref 70–99)
Glucose-Capillary: 98 mg/dL (ref 70–99)

## 2018-03-27 MED ORDER — HYDRALAZINE HCL 25 MG PO TABS: 25.0000 mg | ORAL_TABLET | Freq: Three times a day (TID) | ORAL | 0 refills | Status: DC

## 2018-03-27 MED ORDER — CLONIDINE HCL 0.2 MG PO TABS: 0.2000 mg | ORAL_TABLET | Freq: Two times a day (BID) | ORAL | 0 refills | Status: DC

## 2018-03-27 MED ORDER — NEPRO/CARBSTEADY PO LIQD: 237.0000 mL | Freq: Two times a day (BID) | ORAL | 0 refills | Status: DC

## 2018-03-27 MED ORDER — RENA-VITE PO TABS: 1.0000 | ORAL_TABLET | Freq: Every day | ORAL | 0 refills | Status: DC

## 2018-03-27 NOTE — Discharge Instructions (Signed)
[place]  Tuesday, Thursday and Saturday at 12noon

## 2018-03-27 NOTE — Progress Notes (Signed)
This note also relates to the following rows which could not be included: Pulse Rate - Cannot attach notes to unvalidated device data Resp - Cannot attach notes to unvalidated device data  Hd started  

## 2018-03-27 NOTE — Progress Notes (Signed)
This note also relates to the following rows which could not be included: Pulse Rate - Cannot attach notes to unvalidated device data Resp - Cannot attach notes to unvalidated device data  Hd copleted

## 2018-03-27 NOTE — Discharge Summary (Signed)
[employer] [facility] [place] NAME: Fenix Rorke    MR#:  [id number]  DATE OF BIRTH:  [birth year]  DATE OF ADMISSION:  03/21/2018 ADMITTING PHYSICIAN: Saundra Shelling, MD  DATE OF DISCHARGE: 03/27/2018  PRIMARY CARE PHYSICIAN: [name], MD    ADMISSION DIAGNOSIS:  Acute kidney failure, unspecified ([place]) [N17.9] Shortness of breath [R06.02] Chronic kidney disease, stage IV (severe) ([facility]) [N18.4] Acute renal failure superimposed on chronic kidney disease, unspecified CKD stage, unspecified acute renal failure type ([place]) [N17.9, N18.9]  DISCHARGE DIAGNOSIS:  Active Problems:   Renal failure   Malnutrition of moderate degree   SECONDARY DIAGNOSIS:   Past Medical History:  Diagnosis Date  . Diabetes mellitus without complication ([place])   . Hypertension   . Prostate cancer [facility])     HOSPITAL COURSE:   1.  Acute pulmonary edema secondary to fluid overload with progressive renal disease.  Patient was started on dialysis and this has improved. 2.  Acute on chronic kidney disease stage V now requiring hemodialysis.  Patient declared end-stage renal disease and had a PermCath placed on the right chest.  Had 3 dialysis sessions.  Will be transition to outpatient dialysis at [facility] on Tuesday Thursday and Saturday at 12 noon. 3.  Anemia of chronic disease.  Start patient on Epogen with dialysis as outpatient 4.  Essential hypertension on amlodipine hydralazine and clonidine.  Consider tapering clonidine down as outpatient.  This must be done slowly. 5.  Moderate malnutrition started on protein drinks 6.  Type 2 diabetes mellitus on diet control 7.  History of prostate cancer   DISCHARGE CONDITIONS:   Satisfactory  CONSULTS OBTAINED:  Nephrology  DRUG ALLERGIES:   Allergies  Allergen Reactions  . Penicillin G     Other reaction(s): Unknown    DISCHARGE MEDICATIONS:   Allergies as of 03/27/2018      Reactions   Penicillin G    Other reaction(s): Unknown      Medication List    TAKE these medications   allopurinol 100 MG tablet Commonly known as:  ZYLOPRIM Take 100 mg by mouth daily.   amLODipine 10 MG tablet Commonly known as:  NORVASC Take 10 mg by mouth daily.   aspirin EC 325 MG tablet Take 325 mg by mouth daily.   cloNIDine 0.2 MG tablet Commonly known as:  CATAPRES Take 1 tablet (0.2 mg total) by mouth 2 (two) times daily. What changed:  when to take this   feeding supplement (NEPRO CARB STEADY) Liqd Take 237 mLs by mouth 2 (two) times daily between meals.   hydrALAZINE 25 MG tablet Commonly known as:  APRESOLINE Take 1 tablet (25 mg total) by mouth every 8 (eight) hours. What changed:  when to take this   LEXAPRO 10 MG tablet Generic drug:  escitalopram Take 10 mg by mouth daily.   multivitamin Tabs tablet Take 1 tablet by mouth at bedtime.        DISCHARGE INSTRUCTIONS:   Follow-up with dialysis on Tuesday Thursday Saturday at 12 noon at [facility] Follow-up PMD 5 days Follow-up vascular surgery as outpatient  If you experience worsening of your admission symptoms, develop shortness of breath, life threatening emergency, suicidal or homicidal thoughts you must seek medical attention immediately by calling 911 or calling your MD immediately  if symptoms less severe.  You Must read complete instructions/literature along with all the possible adverse reactions/side effects for all the Medicines you take and that have been prescribed to  you. Take any new Medicines after you have completely understood and accept all the possible adverse reactions/side effects.   Please note  You were cared for by a hospitalist during your hospital stay. If you have any questions about your discharge medications or the care you received while you were in the hospital after you are discharged, you can call the unit and asked to speak with the hospitalist on call if the hospitalist that took care of you is  not available. Once you are discharged, your primary care physician will handle any further medical issues. Please note that NO REFILLS for any discharge medications will be authorized once you are discharged, as it is imperative that you return to your primary care physician (or establish a relationship with a primary care physician if you do not have one) for your aftercare needs so that they can reassess your need for medications and monitor your lab values.    Today   CHIEF COMPLAINT:   Chief Complaint  Patient presents with  . Shortness of Breath    HISTORY OF PRESENT ILLNESS:  Aarnav Steagall  is a 72 y.o. male presented with shortness of breath   VITAL SIGNS:  Blood pressure (!) 144/84, pulse (!) 44, temperature 99.4 F (37.4 C), temperature source Oral, resp. rate 20, height 6\' 1"  (1.854 m), weight 65.1 kg, SpO2 99 %.    PHYSICAL EXAMINATION:  GENERAL:  72 y.o.-year-old patient lying in the bed with no acute distress.  EYES: Pupils equal, round, reactive to light and accommodation. No scleral icterus. Extraocular muscles intact.  HEENT: Head atraumatic, normocephalic. Oropharynx and nasopharynx clear.  NECK:  Supple, no jugular venous distention. No thyroid enlargement, no tenderness.  LUNGS: Normal breath sounds bilaterally, no wheezing, rales,rhonchi or crepitation. No use of accessory muscles of respiration.  CARDIOVASCULAR: S1, S2 normal. No murmurs, rubs, or gallops.  ABDOMEN: Soft, non-tender, non-distended. Bowel sounds present. No organomegaly or mass.  EXTREMITIES: No pedal edema, cyanosis, or clubbing.  NEUROLOGIC: Cranial nerves II through XII are intact. Muscle strength 5/5 in all extremities. Sensation intact. Gait not checked.  PSYCHIATRIC: The patient is alert and oriented x 3.  SKIN: No obvious rash, lesion, or ulcer.   DATA REVIEW:   CBC Recent Labs  Lab 03/26/18 0611  WBC 8.9  HGB 8.9*  HCT 26.9*  PLT 212    Chemistries  Recent Labs  Lab  03/25/18 1557  NA 140  K 4.3  CL 107  CO2 23  GLUCOSE 89  BUN 67*  CREATININE 6.16*  CALCIUM 8.5*    Cardiac Enzymes Recent Labs  Lab 03/21/18 1044  TROPONINI <0.03      Management plans discussed with the patient, family (son [name]) and they are in agreement.  CODE STATUS:     Code Status Orders  (From admission, onward)         Start     Ordered   03/21/18 1316  Full code  Continuous     03/21/18 1315        Code Status History    This patient has a current code status but no historical code status.      TOTAL TIME TAKING CARE OF THIS PATIENT: 35 minutes.    Loletha Grayer M.D on 03/27/2018 at 2:26 PM  Between 7am to 6pm - Pager - [phone]  After 6pm go to [email or web address] - password [email or web address] [facility]  [phone]  CC: Primary care physician; [name], MD

## 2018-03-27 NOTE — Progress Notes (Signed)
Post dialysis assessment 

## 2018-03-27 NOTE — Progress Notes (Signed)
[facility]  ROUNDING NOTE   Subjective:  Patient due for hemodialysis today. Starting to feel better as compared to admission.  Objective:  Vital signs in last 24 hours:  Temp:  [97.8 F (36.6 C)-99.6 F (37.6 C)] 98.3 F (36.8 C) (10/26 0749) Pulse Rate:  [53-98] 82 (10/26 0749) Resp:  [14-27] 18 (10/26 0406) BP: (125-179)/(66-100) 155/73 (10/26 0749) SpO2:  [91 %-99 %] 92 % (10/26 0749) Weight:  [65.1 kg] 65.1 kg (10/25 1315)  Weight change: -0.8 kg Filed Weights   03/25/18 2058 03/26/18 0945 03/26/18 1315  Weight: 68.4 kg 67.9 kg 65.1 kg    Intake/Output: I/O last 3 completed shifts: In: 240 [P.O.:240] Out: 1609 [Urine:100; VHQIO:9629]   Intake/Output this shift:  Total I/O In: 240 [P.O.:240] Out: -   Physical Exam: General: NAD  Head: Normocephalic, atraumatic. Moist oral mucosal membranes  Eyes: Anicteric  Neck: Supple, trachea midline  Lungs:  CTAB, normal effort  Heart: Regular rate and rhythm  Abdomen:  Soft, nontender, BS present  Extremities: trace peripheral edema.  Neurologic: Nonfocal, moving all four extremities  Skin: No lesions  Access: R IJ permcath    Basic Metabolic Panel: Recent Labs  Lab 03/21/18 1044  03/22/18 0528 03/23/18 0548 03/24/18 0948 03/25/18 1557 03/26/18 1021  NA 139  --  140 141 140 140  --   K 4.8  --  5.0 4.8 4.8 4.3  --   CL 115*  --  113* 114* 110 107  --   CO2 14*  --  16* 17* 17* 23  --   GLUCOSE 189*  --  110* 103* 109* 89  --   BUN 76*  --  76* 83* 84* 67*  --   CREATININE 7.36*  --  6.80* 7.09* 7.29* 6.16*  --   CALCIUM 9.0  --  8.7* 8.8* 8.6* 8.5*  --   PHOS  --    < > 4.4 3.7 4.2 3.8  3.8 3.0   < > = values in this interval not displayed.    Liver Function Tests: Recent Labs  Lab 03/22/18 0528 03/23/18 0548 03/24/18 0948 03/25/18 1557  ALBUMIN 3.4* 3.3* 3.3* 3.1*   No results for input(s): LIPASE, AMYLASE in the last 168 hours. No results for input(s): AMMONIA in the last 168  hours.  CBC: Recent Labs  Lab 03/21/18 1044 03/22/18 0528 03/25/18 1557 03/26/18 0611  WBC 9.4 10.4 9.6 8.9  HGB 9.6* 9.1* 9.1* 8.9*  HCT 30.3* 27.8* 27.9* 26.9*  MCV 95.9 93.9 93.3 93.1  PLT 227 221 226 212    Cardiac Enzymes: Recent Labs  Lab 03/21/18 1044  TROPONINI <0.03    BNP: Invalid input(s): POCBNP  CBG: Recent Labs  Lab 03/26/18 0759 03/26/18 1352 03/26/18 1639 03/26/18 2117 03/27/18 0731  GLUCAP 103* 123* 175* 91 105*    Microbiology: No results found for this or any previous visit.  Coagulation Studies: No results for input(s): LABPROT, INR in the last 72 hours.  Urinalysis: No results for input(s): COLORURINE, LABSPEC, PHURINE, GLUCOSEU, HGBUR, BILIRUBINUR, KETONESUR, PROTEINUR, UROBILINOGEN, NITRITE, LEUKOCYTESUR in the last 72 hours.  Invalid input(s): APPERANCEUR    Imaging: No results found.   Medications:   . sodium chloride     . amLODipine  10 mg Oral Daily  . aspirin EC  325 mg Oral Daily  . Chlorhexidine Gluconate Cloth  6 each Topical Q0600  . cloNIDine  0.2 mg Oral BID  . escitalopram  10 mg Oral  Daily  . feeding supplement (NEPRO CARB STEADY)  237 mL Oral BID BM  . heparin  5,000 Units Subcutaneous Q8H  . hydrALAZINE  25 mg Oral Q8H  . insulin aspart  0-15 Units Subcutaneous TID WC  . insulin aspart  0-5 Units Subcutaneous QHS  . multivitamin  1 tablet Oral QHS  . sodium chloride flush  3 mL Intravenous Q12H   sodium chloride, acetaminophen **OR** acetaminophen, hydrALAZINE, ondansetron **OR** ondansetron (ZOFRAN) IV, senna-docusate, sodium chloride flush, traMADol  Assessment/ Plan:  [name] is a 72 y.o. african [name] male with chronic kidney disease stage IV, hypertension, gout, depression, diabetes mellitus type II, history of prostate cancer who was admitted to [facility] on 03/21/2018 for Acute kidney failure.   1. End stage renal disease.  - He was previously followed by [facility] nephrology. However per his  request he asked that we follow him now locally.  -Patient due for another dialysis session today.  His outpatient dialysis has been set up for [facility].  2. Anemia of chronic kidney disease -Patient will be started on Epogen as an outpatient.  3. Hypertension  - continue amlodipine, hydralazine, clonidine.  Blood pressure this AM ios 155/73.   4. Hypoalbuminemia  - albumin of 3.3 on 10/22.  - Currently receiving nepro supplementation   5.  Secondary hyperparathyroidism.  PTH 226 and normal phos.  Will monitor these as outpt.   LOS: 6 10/26/201911:49 AM  [name] PA-C   Patient seen and examined with physician assistant. Agree with above plan.   [name], [facility]  10/26/201911:49 AM

## 2018-04-16 ENCOUNTER — Other Ambulatory Visit (INDEPENDENT_AMBULATORY_CARE_PROVIDER_SITE_OTHER): Payer: Self-pay | Admitting: Vascular Surgery

## 2018-04-16 ENCOUNTER — Ambulatory Visit (INDEPENDENT_AMBULATORY_CARE_PROVIDER_SITE_OTHER): Payer: Medicare Other | Admitting: Vascular Surgery

## 2018-04-16 ENCOUNTER — Other Ambulatory Visit (INDEPENDENT_AMBULATORY_CARE_PROVIDER_SITE_OTHER): Payer: Medicare Other

## 2018-04-16 ENCOUNTER — Encounter (INDEPENDENT_AMBULATORY_CARE_PROVIDER_SITE_OTHER): Payer: Self-pay | Admitting: Vascular Surgery

## 2018-04-16 VITALS — BP 164/83 | HR 66 | Resp 15 | Ht 73.0 in | Wt 154.8 lb

## 2018-04-16 DIAGNOSIS — N186 End stage renal disease: Secondary | ICD-10-CM | POA: Diagnosis not present

## 2018-04-16 DIAGNOSIS — F17200 Nicotine dependence, unspecified, uncomplicated: Secondary | ICD-10-CM

## 2018-04-16 DIAGNOSIS — I120 Hypertensive chronic kidney disease with stage 5 chronic kidney disease or end stage renal disease: Secondary | ICD-10-CM

## 2018-04-16 DIAGNOSIS — Z992 Dependence on renal dialysis: Secondary | ICD-10-CM

## 2018-04-16 DIAGNOSIS — E1129 Type 2 diabetes mellitus with other diabetic kidney complication: Secondary | ICD-10-CM | POA: Insufficient documentation

## 2018-04-16 DIAGNOSIS — E1122 Type 2 diabetes mellitus with diabetic chronic kidney disease: Secondary | ICD-10-CM | POA: Diagnosis not present

## 2018-04-16 DIAGNOSIS — I10 Essential (primary) hypertension: Secondary | ICD-10-CM | POA: Insufficient documentation

## 2018-04-16 DIAGNOSIS — E119 Type 2 diabetes mellitus without complications: Secondary | ICD-10-CM

## 2018-04-16 NOTE — Assessment & Plan Note (Signed)
Likely an underlying cause of his renal failure and blood pressure control important in reducing the progression of atherosclerotic disease. On appropriate oral medications.

## 2018-04-16 NOTE — Assessment & Plan Note (Signed)
His vein mapping today shows what appears to be adequate cephalic vein in the upper arms bilaterally with inadequate cephalic veins bilaterally in the forearms.  Arterial waveforms are triphasic throughout both upper extremities.  Recommend:  At this time the patient does not have appropriate extremity access for dialysis  Patient should have a left brachiocephalic AV fistula created.  The risks, benefits and alternative therapies were reviewed in detail with the patient.  All questions were answered.  The patient agrees to proceed with surgery.

## 2018-04-16 NOTE — Patient Instructions (Signed)
AV Fistula Placement  Arteriovenous (AV) fistula placement is a surgical procedure to create a connection between a blood vessel that carries blood away from your heart (artery) and a blood vessel that returns blood to your heart (vein). The connection is called a fistula. It is often made in the forearm or upper arm.  You may need this procedure if you are getting hemodialysis treatments for kidney disease. An AV fistula makes your vein larger and stronger over several months. This makes the vein a safe and easy spot to insert the needles that are used for hemodialysis.  Tell a health care provider about:  · Any allergies you have.  · All medicines you are taking, including vitamins, herbs, eye drops, creams, and over-the-counter medicines.  · Any problems you or family members have had with anesthetic medicines.  · Any blood disorders you have.  · Any surgeries you have had.  · Any medical conditions you have.  What are the risks?  Generally, this is a safe procedure. However, problems may occur, including:  · Infection.  · Blood clot (thrombosis).  · Reduced blood flow (stenosis).  · Weakening or ballooning out of the fistula (aneurysm).  · Bleeding.  · Allergic reactions to medicines.  · Nerve damage.  · Swelling near the fistula (lymphedema).  · Weakening of your heart (congestive heart failure).  · Failure of the procedure.    What happens before the procedure?  · Imaging tests of your arm may be done to find the best place for the fistula.  · Ask your health care provider about:  ? Changing or stopping your regular medicines. This is especially important if you are taking diabetes medicines or blood thinners.  ? Taking medicines such as aspirin and ibuprofen. These medicines can thin your blood. Do not take these medicines before your procedure if your health care provider instructs you not to.  · Follow instructions from your health care provider about eating or drinking restrictions.  · You may be given  antibiotic medicine to help prevent infection.  · Ask your health care provider how your surgical site will be marked or identified.  · Plan to have someone take you home after the procedure.  What happens during the procedure?  · To reduce your risk of infection:  ? Your health care team will wash or sanitize their hands.  ? Your skin will be washed with soap.  ? Hair may be removed from the surgical area.  · An IV tube will be started in one of your veins.  · You will be given one or more of the following:  ? A medicine to help you relax (sedative).  ? A medicine to numb the area (local anesthetic).  ? A medicine to make you fall asleep (general anesthetic).  ? A medicine that is injected into an area of your body to numb everything below the injection site (regional anesthetic).  · The fistula site will be cleaned with a germ-killing solution (antiseptic).  · A cut (incision) will be made on the inner side of your arm.  · A vein and an artery will be opened and connected with stitches (sutures).  · The incision will be closed with sutures or clips.  · A bandage (dressing) will be placed over the area.  The procedure may vary among health care providers and hospitals.  What happens after the procedure?  · Your blood pressure, heart rate, breathing rate, and blood oxygen level will be   monitored often until the medicines you were given have worn off.  · Your fistula site will be checked for bleeding or swelling.  · You will be given pain medication as needed.  · Do not drive for 24 hours if you received a sedative.  This information is not intended to replace advice given to you by your health care provider. Make sure you discuss any questions you have with your health care provider.  Document Released: 04/30/2015 Document Revised: 10/25/2015 Document Reviewed: 08/09/2014  Elsevier Interactive Patient Education © 2017 [employer]

## 2018-04-16 NOTE — Assessment & Plan Note (Signed)
Likely an underlying cause of his renal failure and blood glucose control important in reducing the progression of atherosclerotic disease. Also, involved in wound healing. On appropriate medications.

## 2018-04-16 NOTE — Progress Notes (Signed)
Patient ID: OLE LAFON, male   DOB: [birth year], 72 y.o.   MRN: [id number]  Chief Complaint  Patient presents with  . New Patient (Initial Visit)    ref Holley Raring for vein mapping    HPI MAXIMUS HOFFERT is a 72 y.o. male.  I am asked to see the patient by Dr. Holley Raring for evaluation of permanent dialysis access.  The patient reports his wife dying a few months ago and he him not taking very good care of himself following that.  He started dialysis through a [facility] about 2 to 3 weeks ago.  He is right-hand dominant.  When asked what days he is getting dialysis he said Monday, Thursday, and Saturday.  That is an unusual schedule and I suspect it is Tuesday Thursday Saturday.  He is not yet had any dialysis accesses laced in his arms.  His vein mapping today shows what appears to be adequate cephalic vein in the upper arms bilaterally with inadequate cephalic veins bilaterally in the forearms.  Arterial waveforms are triphasic throughout both upper extremities.   Past Medical History:  Diagnosis Date  . Diabetes mellitus without complication ([place])   . Hypertension   . Prostate cancer [facility])     Past Surgical History:  Procedure Laterality Date  . APPENDECTOMY    . CHOLECYSTECTOMY    . DIALYSIS/PERMA CATHETER INSERTION N/A 03/24/2018   Procedure: DIALYSIS/PERMA CATHETER INSERTION;  Surgeon: Katha Cabal, MD;  Location: [place] [facility];  Service: Cardiovascular;  Laterality: N/A;    Family History No bleeding disorders, clotting disorders, porphyria, or aneurysms   Social History   Tobacco Use  . Smoking status: Current Every Day Smoker  . Smokeless tobacco: Never Used  Substance Use Topics  . Alcohol use: Never    Alcohol/week: 0.0 standard drinks    Frequency: Never  . Drug use: Never  W[place]  Allergies  Allergen Reactions  . Penicillin G     Other reaction(s): Unknown    Current Outpatient Medications  Medication Sig Dispense Refill  . allopurinol  (ZYLOPRIM) 100 MG tablet Take 100 mg by mouth daily.     Marland Kitchen amLODipine (NORVASC) 10 MG tablet Take 10 mg by mouth daily.     Marland Kitchen aspirin EC 325 MG tablet Take 325 mg by mouth daily.     . cloNIDine (CATAPRES) 0.2 MG tablet Take 1 tablet (0.2 mg total) by mouth 2 (two) times daily. 60 tablet 0  . escitalopram (LEXAPRO) 10 MG tablet Take 10 mg by mouth daily.     . hydrALAZINE (APRESOLINE) 25 MG tablet Take 1 tablet (25 mg total) by mouth every 8 (eight) hours. 90 tablet 0  . multivitamin (RENA-VIT) TABS tablet Take 1 tablet by mouth at bedtime. 30 tablet 0  . Nutritional Supplements (FEEDING SUPPLEMENT, NEPRO CARB STEADY,) LIQD Take 237 mLs by mouth 2 (two) times daily between meals. 60 Can 0   No current facility-administered medications for this visit.       REVIEW OF SYSTEMS (Negative unless checked)  Constitutional: _0 Weight loss  _1 Fever  _2 Chills Cardiac: _3 Chest pain   _4 Chest pressure   _5 Palpitations   _6 Shortness of breath when laying flat   _7 Shortness of breath at rest   _8 Shortness of breath with exertion. Vascular:  _9 Pain in legs with walking   _10 Pain in legs at rest   _11 Pain in legs when laying flat   _12 Claudication   _13 Pain in feet when walking  _14 Pain in feet at  rest  _0 Pain in feet when laying flat   _1 History of DVT   _2 Phlebitis   _3 Swelling in legs   _4 Varicose veins   _5 Non-healing ulcers Pulmonary:   _6 Uses home oxygen   _7 Productive cough   _8 Hemoptysis   _9 Wheeze  _10 COPD   _11 Asthma Neurologic:  _12 Dizziness  _13 Blackouts   _14 Seizures   _15 History of stroke   _16 History of TIA  _17 Aphasia   _18 Temporary blindness   _19 Dysphagia   _20 Weakness or numbness in arms   _21 Weakness or numbness in legs Musculoskeletal:  _22 Arthritis   _23 Joint swelling   _24 Joint pain   _25 Low back pain Hematologic:  _26 Easy bruising  _27 Easy bleeding   _28 Hypercoagulable state   _29 Anemic  _30 Hepatitis Gastrointestinal:  _31 Blood in stool   _32 Vomiting blood  _33 Gastroesophageal reflux/heartburn   _34 Abdominal  pain Genitourinary:  _35 Chronic kidney disease   _36 Difficult urination  _37 Frequent urination  _38 Burning with urination   _39 Hematuria Skin:  _40 Rashes   _41 Ulcers   _42 Wounds Psychological:  _43 History of anxiety   _44  History of major depression.    Physical Exam BP (!) 164/83 (BP Location: Right Arm)   Pulse 66   Resp 15   Ht _45  (1.854 m)   Wt 154 lb 12.8 oz (70.2 kg)   BMI 20.42 kg/m  Gen: Frail and somewhat debilitated, NAD Head: [place]/AT, No temporalis wasting.  Ear/Nose/Throat: Hearing grossly intact, nares w/o erythema or drainage, oropharynx w/o Erythema/Exudate Eyes: Conjunctiva clear, sclera non-icteric  Neck: trachea midline.   Pulmonary:  Good air movement, respirations not labored, no use of accessory muscles Cardiac: RRR, noJVD Vascular: Allen's test normal bilaterally Vessel Right Left  Radial Palpable Palpable                                   Gastrointestinal: soft, non-tender/non-distended.  Musculoskeletal: M/S 5/5 throughout.  Extremities without ischemic changes.  No deformity or atrophy.  Trace lower extremity edema. Neurologic: Sensation grossly intact in extremities.  Symmetrical.  Speech is fluent. Motor exam as listed above. Psychiatric: Judgment intact, Mood & affect appropriate for pt's clinical situation. Dermatologic: No rashes or ulcers noted.  No cellulitis or open wounds.    Radiology Dg Chest 2 View  Result Date: 03/21/2018 CLINICAL DATA:  Shortness of breath and cold like symptoms. History of prostate cancer. EXAM: CHEST - 2 VIEW COMPARISON:  03/08/2007 FINDINGS: Mildly enlarged cardiac silhouette. Calcific atherosclerotic disease of the aorta. There is no evidence of pneumothorax. Small bilateral pleural effusions and mild pulmonary edema. Mild bilateral peribronchial opacities. Osseous structures are without acute abnormality. Soft tissues are grossly normal. IMPRESSION: Mild interstitial pulmonary edema with small bilateral pleural  effusions. Probable superimposed peribronchial thickening may represent acute bronchitis. Electronically Signed   By: Fidela Salisbury M.D.   On: 03/21/2018 12:15   US Renal  Result Date: 03/21/2018 CLINICAL DATA:  Acute renal failure.  Prostate cancer. EXAM: RENAL / URINARY TRACT ULTRASOUND COMPLETE COMPARISON:  CT of the pelvis 08/04/2014 FINDINGS: Right Kidney: Length: 8.5 cm. The parenchyma is echogenic. No discrete lesions are present. Left Kidney: Length: 6.2 cm. The parenchyma is echogenic. A benign cyst in the midportion of the left kidney measures 1.7 x 1.7 x 1.6 cm. Bladder: Appears normal for degree of bladder distention. Bilateral pleural effusions are present. IMPRESSION: 1. Small echogenic kidneys compatible with nonspecific medical renal disease. 2. Benign cyst of the left kidney. 3. Bilateral pleural effusions. Electronically Signed  By: San Morelle M.D.   On: 03/21/2018 14:06    Labs Recent Results (from the past 2160 hour(s))  Basic metabolic panel     Status: Abnormal   Collection Time: 03/21/18 10:44 AM  Result Value Ref Range   Sodium 139 135 - 145 mmol/L   Potassium 4.8 3.5 - 5.1 mmol/L   Chloride 115 (H) 98 - 111 mmol/L   CO2 14 (L) 22 - 32 mmol/L   Glucose, Bld 189 (H) 70 - 99 mg/dL   BUN 76 (H) 8 - 23 mg/dL   Creatinine, Ser 7.36 (H) 0.61 - 1.24 mg/dL   Calcium 9.0 8.9 - 10.3 mg/dL   GFR calc non Af Amer 7 (L) >60 mL/min   GFR calc Af Amer 8 (L) >60 mL/min    Comment: (NOTE) The eGFR has been calculated using the CKD EPI equation. This calculation has not been validated in all clinical situations. eGFR's persistently <60 mL/min signify possible Chronic Kidney Disease.    Anion gap 10 5 - 15    Comment: Performed at [facility], [address] [place], [place] [address]  CBC     Status: Abnormal   Collection Time: 03/21/18 10:44 AM  Result Value Ref Range   WBC 9.4 4.0 - 10.5 K/uL   RBC 3.16 (L) 4.22 - 5.81 MIL/uL   Hemoglobin 9.6  (L) 13.0 - 17.0 g/dL   HCT 30.3 (L) 39.0 - 52.0 %   MCV 95.9 80.0 - 100.0 fL   MCH 30.4 26.0 - 34.0 pg   MCHC 31.7 30.0 - 36.0 g/dL   RDW 15.9 (H) 11.5 - 15.5 %   Platelets 227 150 - 400 K/uL   nRBC 0.0 0.0 - 0.2 %    Comment: Performed at [facility], [address] [place], [place] [address]  Troponin I     Status: None   Collection Time: 03/21/18 10:44 AM  Result Value Ref Range   Troponin I <0.03 <0.03 ng/mL    Comment: Performed at [facility], [address] [place], [place] [address]  Brain natriuretic peptide     Status: Abnormal   Collection Time: 03/21/18 11:01 AM  Result Value Ref Range   B Natriuretic Peptide 2,596.0 (H) 0.0 - 100.0 pg/mL    Comment: Performed at [facility], [address] [place], [place] [address]  TSH     Status: Abnormal   Collection Time: 03/21/18 11:01 AM  Result Value Ref Range   TSH 6.748 (H) 0.350 - 4.500 uIU/mL    Comment: Performed by a 3rd Generation assay with a functional sensitivity of <=0.01 uIU/mL. Performed at [facility], [address] [place], [place] [address]   T4, free     Status: None   Collection Time: 03/21/18 11:01 AM  Result Value Ref Range   Free T4 1.00 0.82 - 1.77 ng/dL    Comment: (NOTE) Biotin ingestion may interfere with free T4 tests. If the results are inconsistent with the TSH level, previous test results, or the clinical presentation, then consider biotin interference. If needed, order repeat testing after stopping biotin. Performed at [facility], [address] [place], [place] [address]   Urinalysis, Routine w reflex microscopic     Status: Abnormal   Collection Time: 03/21/18  2:38 PM  Result Value Ref Range   Color, Urine YELLOW (A) YELLOW   APPearance CLEAR (A) CLEAR   Specific Gravity, Urine 1.011 1.005 - 1.030  pH 7.0 5.0 - 8.0   Glucose, UA NEGATIVE NEGATIVE mg/dL   Hgb urine dipstick NEGATIVE NEGATIVE   Bilirubin Urine NEGATIVE  NEGATIVE   Ketones, ur NEGATIVE NEGATIVE mg/dL   Protein, ur 100 (A) NEGATIVE mg/dL   Nitrite NEGATIVE NEGATIVE   Leukocytes, UA NEGATIVE NEGATIVE   RBC / HPF 0-5 0 - 5 RBC/hpf   WBC, UA 0-5 0 - 5 WBC/hpf   Bacteria, UA NONE SEEN NONE SEEN   Squamous Epithelial / LPF 0-5 0 - 5    Comment: Performed at [facility], [address] [place], [place] [address]  Parathyroid hormone, intact (no Ca)     Status: Abnormal   Collection Time: 03/21/18  2:43 PM  Result Value Ref Range   PTH 158 (H) 15 - 65 pg/mL    Comment: (NOTE) Performed At: [facility] [place], [place] [id number] Rush Farmer MD IW:[id number]   Phosphorus     Status: None   Collection Time: 03/21/18  2:43 PM  Result Value Ref Range   Phosphorus 3.9 2.5 - 4.6 mg/dL    Comment: Performed at [facility], [address] [place], [place] [address]  Glucose, capillary     Status: Abnormal   Collection Time: 03/21/18  4:52 PM  Result Value Ref Range   Glucose-Capillary 104 (H) 70 - 99 mg/dL  Glucose, capillary     Status: Abnormal   Collection Time: 03/21/18 10:15 PM  Result Value Ref Range   Glucose-Capillary 116 (H) 70 - 99 mg/dL  CBC     Status: Abnormal   Collection Time: 03/22/18  5:28 AM  Result Value Ref Range   WBC 10.4 4.0 - 10.5 K/uL   RBC 2.96 (L) 4.22 - 5.81 MIL/uL   Hemoglobin 9.1 (L) 13.0 - 17.0 g/dL   HCT 27.8 (L) 39.0 - 52.0 %   MCV 93.9 80.0 - 100.0 fL   MCH 30.7 26.0 - 34.0 pg   MCHC 32.7 30.0 - 36.0 g/dL   RDW 15.4 11.5 - 15.5 %   Platelets 221 150 - 400 K/uL   nRBC 0.0 0.0 - 0.2 %    Comment: Performed at [facility], [address] [place], [place] [address]  Renal function panel     Status: Abnormal   Collection Time: 03/22/18  5:28 AM  Result Value Ref Range   Sodium 140 135 - 145 mmol/L   Potassium 5.0 3.5 - 5.1 mmol/L   Chloride 113 (H) 98 - 111 mmol/L   CO2 16 (L) 22 - 32 mmol/L   Glucose, Bld 110 (H) 70 - 99 mg/dL   BUN 76  (H) 8 - 23 mg/dL   Creatinine, Ser 6.80 (H) 0.61 - 1.24 mg/dL   Calcium 8.7 (L) 8.9 - 10.3 mg/dL   Phosphorus 4.4 2.5 - 4.6 mg/dL   Albumin 3.4 (L) 3.5 - 5.0 g/dL   GFR calc non Af Amer 7 (L) >60 mL/min   GFR calc Af Amer 8 (L) >60 mL/min    Comment: (NOTE) The eGFR has been calculated using the CKD EPI equation. This calculation has not been validated in all clinical situations. eGFR's persistently <60 mL/min signify possible Chronic Kidney Disease.    Anion gap 11 5 - 15    Comment: Performed at [facility], [address] [place], [place] [address]  Hepatitis B surface antibody,qualitative     Status: None   Collection Time: 03/22/18  5:28 AM  Result  Value Ref Range   Hep B S Ab Reactive     Comment: (NOTE)              Non Reactive: Inconsistent with immunity,                            less than 10 mIU/mL              Reactive:     Consistent with immunity,                            greater than 9.9 mIU/mL Performed At: [facility] [place], [place] [id number] Rush Farmer MD QQ:[id number]   Hepatitis B core antibody, IgM     Status: None   Collection Time: 03/22/18  5:28 AM  Result Value Ref Range   Hep B C IgM Negative Negative    Comment: (NOTE) Performed At: [facility] [address] [place], [place] [id number] Rush Farmer MD [id number]   Hepatitis B surface antigen     Status: None   Collection Time: 03/22/18  5:28 AM  Result Value Ref Range   Hepatitis B Surface Ag Negative Negative    Comment: (NOTE) Performed At: [facility] [place], [place] [id number] Rush Farmer MD [id number]   Hepatitis C antibody     Status: None   Collection Time: 03/22/18  5:28 AM  Result Value Ref Range   HCV Ab <0.1 0.0 - 0.9 s/co ratio    Comment: (NOTE)                                  Negative:     < 0.8                             Indeterminate: 0.8 - 0.9                                   Positive:     > 0.9 The CDC recommends that a positive HCV antibody result be followed up with a HCV Nucleic Acid Amplification test (672094). Performed At: [facility] [place], [place] [id number] Rush Farmer MD [id number]   Ronney Asters Plus     Status: None   Collection Time: 03/22/18  5:28 AM  Result Value Ref Range   QuantiFERON Incubation Incubation performed.    QuantiFERON-TB Gold Plus Negative Negative    Comment: (NOTE) Performed At: [facility] [place], [place] [id number] Rush Farmer MD TZ:[id number]   Ronney Asters Plus     Status: None   Collection Time: 03/22/18  5:28 AM  Result Value Ref Range   QuantiFERON Criteria Comment     Comment: (NOTE) The QuantiFERON-TB Gold Plus result is determined by subtracting the Nil value from either TB antigen (Ag) tube. The mitogen tube serves as a control for the test.    QuantiFERON TB1 Ag Value 0.03 IU/mL   QuantiFERON TB2 Ag Value 0.03 IU/mL   QuantiFERON Nil Value 0.03 IU/mL   QuantiFERON Mitogen Value 1.32 IU/mL    Comment: (NOTE) Performed At: [facility] [place], [place] [id number] Rush Farmer MD KZ:[id number]  Glucose, capillary     Status: Abnormal   Collection Time: 03/22/18  7:36 AM  Result Value Ref Range   Glucose-Capillary 105 (H) 70 - 99 mg/dL  ECHOCARDIOGRAM COMPLETE     Status: None   Collection Time: 03/22/18  9:48 AM  Result Value Ref Range   Weight 2,512 oz   Height 73 in   BP 159/85 mmHg  Glucose, capillary     Status: Abnormal   Collection Time: 03/22/18 11:57 AM  Result Value Ref Range   Glucose-Capillary 131 (H) 70 - 99 mg/dL  Glucose, capillary     Status: Abnormal   Collection Time: 03/22/18  5:18 PM  Result Value Ref Range   Glucose-Capillary 62 (L) 70 - 99 mg/dL  Glucose, capillary     Status: Abnormal   Collection Time: 03/22/18  7:09 PM  Result Value Ref Range   Glucose-Capillary 108  (H) 70 - 99 mg/dL  Glucose, capillary     Status: Abnormal   Collection Time: 03/22/18  9:14 PM  Result Value Ref Range   Glucose-Capillary 105 (H) 70 - 99 mg/dL  Renal function panel     Status: Abnormal   Collection Time: 03/23/18  5:48 AM  Result Value Ref Range   Sodium 141 135 - 145 mmol/L   Potassium 4.8 3.5 - 5.1 mmol/L   Chloride 114 (H) 98 - 111 mmol/L   CO2 17 (L) 22 - 32 mmol/L   Glucose, Bld 103 (H) 70 - 99 mg/dL   BUN 83 (H) 8 - 23 mg/dL   Creatinine, Ser 7.09 (H) 0.61 - 1.24 mg/dL   Calcium 8.8 (L) 8.9 - 10.3 mg/dL   Phosphorus 3.7 2.5 - 4.6 mg/dL   Albumin 3.3 (L) 3.5 - 5.0 g/dL   GFR calc non Af Amer 7 (L) >60 mL/min   GFR calc Af Amer 8 (L) >60 mL/min    Comment: (NOTE) The eGFR has been calculated using the CKD EPI equation. This calculation has not been validated in all clinical situations. eGFR's persistently <60 mL/min signify possible Chronic Kidney Disease.    Anion gap 10 5 - 15    Comment: Performed at [facility], [address] [place], [place] [address]  Glucose, capillary     Status: Abnormal   Collection Time: 03/23/18  7:39 AM  Result Value Ref Range   Glucose-Capillary 102 (H) 70 - 99 mg/dL  Glucose, capillary     Status: Abnormal   Collection Time: 03/23/18 11:54 AM  Result Value Ref Range   Glucose-Capillary 122 (H) 70 - 99 mg/dL  Glucose, capillary     Status: Abnormal   Collection Time: 03/23/18  4:44 PM  Result Value Ref Range   Glucose-Capillary 145 (H) 70 - 99 mg/dL  Glucose, capillary     Status: None   Collection Time: 03/23/18  9:07 PM  Result Value Ref Range   Glucose-Capillary 95 70 - 99 mg/dL  Glucose, capillary     Status: Abnormal   Collection Time: 03/24/18  7:51 AM  Result Value Ref Range   Glucose-Capillary 108 (H) 70 - 99 mg/dL  Renal function panel     Status: Abnormal   Collection Time: 03/24/18  9:48 AM  Result Value Ref Range   Sodium 140 135 - 145 mmol/L   Potassium 4.8 3.5 - 5.1 mmol/L   Chloride  110 98 - 111 mmol/L   CO2 17 (L) 22 - 32 mmol/L   Glucose, Bld 109 (H) 70 -  99 mg/dL   BUN 84 (H) 8 - 23 mg/dL   Creatinine, Ser 7.29 (H) 0.61 - 1.24 mg/dL   Calcium 8.6 (L) 8.9 - 10.3 mg/dL   Phosphorus 4.2 2.5 - 4.6 mg/dL   Albumin 3.3 (L) 3.5 - 5.0 g/dL   GFR calc non Af Amer 7 (L) >60 mL/min   GFR calc Af Amer 8 (L) >60 mL/min    Comment: (NOTE) The eGFR has been calculated using the CKD EPI equation. This calculation has not been validated in all clinical situations. eGFR's persistently <60 mL/min signify possible Chronic Kidney Disease.    Anion gap 13 5 - 15    Comment: Performed at [facility], [address] [place], [place] [address]  Glucose, capillary     Status: Abnormal   Collection Time: 03/24/18 11:51 AM  Result Value Ref Range   Glucose-Capillary 106 (H) 70 - 99 mg/dL  Glucose, capillary     Status: Abnormal   Collection Time: 03/25/18 12:29 AM  Result Value Ref Range   Glucose-Capillary 130 (H) 70 - 99 mg/dL  Glucose, capillary     Status: Abnormal   Collection Time: 03/25/18  7:34 AM  Result Value Ref Range   Glucose-Capillary 109 (H) 70 - 99 mg/dL  Glucose, capillary     Status: Abnormal   Collection Time: 03/25/18 11:51 AM  Result Value Ref Range   Glucose-Capillary 187 (H) 70 - 99 mg/dL  Protein / creatinine ratio, urine     Status: Abnormal   Collection Time: 03/25/18  2:38 PM  Result Value Ref Range   Creatinine, Urine 113 mg/dL   Total Protein, Urine 147 mg/dL    Comment: RESULT CONFIRMED BY MANUAL DILUTION...[place] NO NORMAL RANGE ESTABLISHED FOR THIS TEST    Protein Creatinine Ratio 1.30 (H) 0.00 - 0.15 mg/mg[Cre]    Comment: Performed at [facility], [address] [place], [place] [address]  Renal function panel     Status: Abnormal   Collection Time: 03/25/18  3:57 PM  Result Value Ref Range   Sodium 140 135 - 145 mmol/L   Potassium 4.3 3.5 - 5.1 mmol/L   Chloride 107 98 - 111 mmol/L   CO2 23 22 - 32 mmol/L    Glucose, Bld 89 70 - 99 mg/dL   BUN 67 (H) 8 - 23 mg/dL   Creatinine, Ser 6.16 (H) 0.61 - 1.24 mg/dL   Calcium 8.5 (L) 8.9 - 10.3 mg/dL   Phosphorus 3.8 2.5 - 4.6 mg/dL   Albumin 3.1 (L) 3.5 - 5.0 g/dL   GFR calc non Af Amer 8 (L) >60 mL/min   GFR calc Af Amer 9 (L) >60 mL/min    Comment: (NOTE) The eGFR has been calculated using the CKD EPI equation. This calculation has not been validated in all clinical situations. eGFR's persistently <60 mL/min signify possible Chronic Kidney Disease.    Anion gap 10 5 - 15    Comment: Performed at [facility], [address] [place], [place] [address]  CBC     Status: Abnormal   Collection Time: 03/25/18  3:57 PM  Result Value Ref Range   WBC 9.6 4.0 - 10.5 K/uL   RBC 2.99 (L) 4.22 - 5.81 MIL/uL   Hemoglobin 9.1 (L) 13.0 - 17.0 g/dL   HCT 27.9 (L) 39.0 - 52.0 %   MCV 93.3 80.0 - 100.0 fL   MCH 30.4 26.0 - 34.0 pg   MCHC 32.6 30.0 - 36.0 g/dL  RDW 15.1 11.5 - 15.5 %   Platelets 226 150 - 400 K/uL   nRBC 0.0 0.0 - 0.2 %    Comment: Performed at [facility], [address] [place], [place] [address]  Phosphorus     Status: None   Collection Time: 03/25/18  3:57 PM  Result Value Ref Range   Phosphorus 3.8 2.5 - 4.6 mg/dL    Comment: Performed at [facility], [address] [place], [place] [address]  Parathyroid hormone, intact (no Ca)     Status: Abnormal   Collection Time: 03/25/18  3:57 PM  Result Value Ref Range   PTH 226 (H) 15 - 65 pg/mL    Comment: (NOTE) Performed At: [facility] [place], [place] [id number] Rush Farmer MD [id number]   Glucose, capillary     Status: None   Collection Time: 03/25/18  4:37 PM  Result Value Ref Range   Glucose-Capillary 78 70 - 99 mg/dL  Glucose, capillary     Status: None   Collection Time: 03/25/18 10:12 PM  Result Value Ref Range   Glucose-Capillary 90 70 - 99 mg/dL  CBC     Status: Abnormal   Collection Time: 03/26/18   6:11 AM  Result Value Ref Range   WBC 8.9 4.0 - 10.5 K/uL   RBC 2.89 (L) 4.22 - 5.81 MIL/uL   Hemoglobin 8.9 (L) 13.0 - 17.0 g/dL   HCT 26.9 (L) 39.0 - 52.0 %   MCV 93.1 80.0 - 100.0 fL   MCH 30.8 26.0 - 34.0 pg   MCHC 33.1 30.0 - 36.0 g/dL   RDW 14.9 11.5 - 15.5 %   Platelets 212 150 - 400 K/uL   nRBC 0.0 0.0 - 0.2 %    Comment: Performed at [facility], [address] [place], [place] [address]  Glucose, capillary     Status: Abnormal   Collection Time: 03/26/18  7:59 AM  Result Value Ref Range   Glucose-Capillary 103 (H) 70 - 99 mg/dL  Phosphorus     Status: None   Collection Time: 03/26/18 10:21 AM  Result Value Ref Range   Phosphorus 3.0 2.5 - 4.6 mg/dL    Comment: Performed at [facility], [address] [place], [place] [address]  Glucose, capillary     Status: Abnormal   Collection Time: 03/26/18  1:52 PM  Result Value Ref Range   Glucose-Capillary 123 (H) 70 - 99 mg/dL  Glucose, capillary     Status: Abnormal   Collection Time: 03/26/18  4:39 PM  Result Value Ref Range   Glucose-Capillary 175 (H) 70 - 99 mg/dL  Glucose, capillary     Status: None   Collection Time: 03/26/18  9:17 PM  Result Value Ref Range   Glucose-Capillary 91 70 - 99 mg/dL  Glucose, capillary     Status: Abnormal   Collection Time: 03/27/18  7:31 AM  Result Value Ref Range   Glucose-Capillary 105 (H) 70 - 99 mg/dL  Phosphorus     Status: Abnormal   Collection Time: 03/27/18 12:04 PM  Result Value Ref Range   Phosphorus 2.3 (L) 2.5 - 4.6 mg/dL    Comment: Performed at [facility], [address] [place], [place] [address]  Glucose, capillary     Status: None   Collection Time: 03/27/18  5:05 PM  Result Value Ref Range   Glucose-Capillary 98 70 - 99 mg/dL    Assessment/Plan:  Diabetes mellitus without complication (  [place]) Likely an underlying cause of his renal failure and blood glucose control important in reducing the progression of atherosclerotic  disease. Also, involved in wound healing. On appropriate medications.   Hypertension Likely an underlying cause of his renal failure and blood pressure control important in reducing the progression of atherosclerotic disease. On appropriate oral medications.   ESRD on dialysis [facility]) His vein mapping today shows what appears to be adequate cephalic vein in the upper arms bilaterally with inadequate cephalic veins bilaterally in the forearms.  Arterial waveforms are triphasic throughout both upper extremities.  Recommend:  At this time the patient does not have appropriate extremity access for dialysis  Patient should have a left brachiocephalic AV fistula created.  The risks, benefits and alternative therapies were reviewed in detail with the patient.  All questions were answered.  The patient agrees to proceed with surgery.        Leotis Pain 04/16/2018, 9:58 AM   This note was created with Dragon medical transcription system.  Any errors from dictation are unintentional.

## 2018-05-13 ENCOUNTER — Telehealth (INDEPENDENT_AMBULATORY_CARE_PROVIDER_SITE_OTHER): Payer: Self-pay

## 2018-05-13 ENCOUNTER — Encounter (INDEPENDENT_AMBULATORY_CARE_PROVIDER_SITE_OTHER): Payer: Self-pay

## 2018-05-13 NOTE — Telephone Encounter (Signed)
Patient was scheduled for surgery on 06/09/18 with pre-op scheduled on 05/28/18 @ 1:00pm. This information was faxed to [place]. Attn. Freida Busman.

## 2018-05-14 ENCOUNTER — Other Ambulatory Visit (INDEPENDENT_AMBULATORY_CARE_PROVIDER_SITE_OTHER): Payer: Self-pay | Admitting: Vascular Surgery

## 2018-05-14 NOTE — Telephone Encounter (Signed)
Should this patient get this refilled for this medication? Or would you want the refill to come from the person that prescribed it?

## 2018-05-26 ENCOUNTER — Encounter: Payer: Self-pay | Admitting: Emergency Medicine

## 2018-05-26 ENCOUNTER — Emergency Department: Payer: Medicare Other

## 2018-05-26 ENCOUNTER — Emergency Department
Admission: EM | Admit: 2018-05-26 | Discharge: 2018-05-26 | Disposition: A | Discharge status: Home / Self Care | Payer: Medicare Other | Attending: Emergency Medicine | Admitting: Emergency Medicine

## 2018-05-26 ENCOUNTER — Other Ambulatory Visit: Payer: Self-pay

## 2018-05-26 DIAGNOSIS — M79671 Pain in right foot: Secondary | ICD-10-CM | POA: Diagnosis present

## 2018-05-26 DIAGNOSIS — N186 End stage renal disease: Secondary | ICD-10-CM | POA: Diagnosis not present

## 2018-05-26 DIAGNOSIS — I120 Hypertensive chronic kidney disease with stage 5 chronic kidney disease or end stage renal disease: Secondary | ICD-10-CM | POA: Diagnosis not present

## 2018-05-26 DIAGNOSIS — Z992 Dependence on renal dialysis: Secondary | ICD-10-CM | POA: Insufficient documentation

## 2018-05-26 DIAGNOSIS — I998 Other disorder of circulatory system: Secondary | ICD-10-CM | POA: Insufficient documentation

## 2018-05-26 DIAGNOSIS — F17200 Nicotine dependence, unspecified, uncomplicated: Secondary | ICD-10-CM | POA: Diagnosis not present

## 2018-05-26 DIAGNOSIS — L84 Corns and callosities: Secondary | ICD-10-CM | POA: Insufficient documentation

## 2018-05-26 DIAGNOSIS — Z7982 Long term (current) use of aspirin: Secondary | ICD-10-CM | POA: Insufficient documentation

## 2018-05-26 DIAGNOSIS — Z79899 Other long term (current) drug therapy: Secondary | ICD-10-CM | POA: Diagnosis not present

## 2018-05-26 DIAGNOSIS — E1122 Type 2 diabetes mellitus with diabetic chronic kidney disease: Secondary | ICD-10-CM | POA: Insufficient documentation

## 2018-05-26 MED ORDER — CLINDAMYCIN PHOSPHATE 300 MG/2ML IJ SOLN
600.0000 mg | Freq: Once | INTRAMUSCULAR | Status: AC
  Administered 2018-05-26: 600 mg via INTRAMUSCULAR

## 2018-05-26 MED ORDER — CLINDAMYCIN HCL 300 MG PO CAPS: 300.0000 mg | ORAL_CAPSULE | Freq: Three times a day (TID) | ORAL | 0 refills | Status: AC

## 2018-05-26 NOTE — Discharge Instructions (Addendum)
The bottom of your second toe looks like there is an infection.  Please take antibiotics as prescribed.  I am also concerned about decreased blood flow to a couple of your right toes. Continue talking your aspirin every day.  I talked to vascular surgery and they are planning to see you next week.  Please call Dr. Dolores Hoose office for an appointment next week for further testing.

## 2018-05-26 NOTE — ED Provider Notes (Signed)
Personally saw and evaluated patient in [place] visit with PA Wagoner.  Strong R dorsalis pedis, but some slight distal slow cap refill does 2-5 R foot. No crepitus, no erythema. Some slight distal dry blistering and skin breakdown R 3rd toe.  Plan to discuss with vascular surgery, suspect some slight distal ischemia as cause of pain.      [name], MD 05/27/18 1215

## 2018-05-26 NOTE — ED Triage Notes (Signed)
R foot pain x 2 weeks, denies injury.

## 2018-05-26 NOTE — ED Notes (Signed)
See triage note  States he did have a corn between right 2nd and 3rd toes..  Now having pain to 2nd,3rd 4th and 5 th toes  States sxs' started about 1 week ago

## 2018-05-26 NOTE — ED Provider Notes (Signed)
[facility] Emergency Department Provider Note  ____________________________________________  Time seen: Approximately 11:13 AM  I have reviewed the triage vital signs and the nursing notes.   HISTORY  Chief Complaint Foot Pain     HPI Jose [name]  is a 72 y.o. male with past medical history end-stage renal disease that presents emergency department for evaluation of toe pain and heel for 2 weeks.  Pain is worse to his second toe and worse with ambulation.  He is unable to describe the pain or exactly quantify how long the pain has been there for.  He has a wound to the bottom of his toe that he is unsure of duration.  No trauma.  No fever.    Past Medical History:  Diagnosis Date  . Diabetes mellitus without complication ([place])   . Hypertension   . Prostate cancer [facility])     Patient Active Problem List   Diagnosis Date Noted  . Diabetes mellitus without complication ([place]) 33/82/5053  . Hypertension 04/16/2018  . ESRD on dialysis ([place]) 04/16/2018  . Malnutrition of moderate degree 03/27/2018  . Renal failure 03/21/2018    Past Surgical History:  Procedure Laterality Date  . APPENDECTOMY    . CHOLECYSTECTOMY    . DIALYSIS/PERMA CATHETER INSERTION N/A 03/24/2018   Procedure: DIALYSIS/PERMA CATHETER INSERTION;  Surgeon: Katha Cabal, MD;  Location: [facility];  Service: Cardiovascular;  Laterality: N/A;    Prior to Admission medications   Medication Sig Start Date End Date Taking? Authorizing Provider  allopurinol (ZYLOPRIM) 100 MG tablet Take 100 mg by mouth daily.     [provider]  amLODipine (NORVASC) 10 MG tablet Take 10 mg by mouth daily.     [provider]  aspirin EC 325 MG tablet Take 325 mg by mouth daily.     [provider]  clindamycin (CLEOCIN) 300 MG capsule Take 1 capsule (300 mg total) by mouth 3 (three) times daily for 10 days. 05/26/18 06/05/18  Laban Emperor, PA-C  cloNIDine  (CATAPRES) 0.2 MG tablet TAKE 1 TABLET BY MOUTH TWICE A DAY 05/14/18   Algernon Huxley, MD  escitalopram (LEXAPRO) 10 MG tablet Take 10 mg by mouth daily.     [provider]  hydrALAZINE (APRESOLINE) 25 MG tablet Take 1 tablet (25 mg total) by mouth every 8 (eight) hours. 03/27/18   Loletha Grayer, MD  multivitamin (RENA-VIT) TABS tablet Take 1 tablet by mouth at bedtime. 03/27/18   Loletha Grayer, MD  Nutritional Supplements (FEEDING SUPPLEMENT, NEPRO CARB STEADY,) LIQD Take 237 mLs by mouth 2 (two) times daily between meals. 03/27/18   Loletha Grayer, MD    Allergies Penicillin g  No family history on file.  Social History Social History   Tobacco Use  . Smoking status: Current Every Day Smoker  . Smokeless tobacco: Never Used  Substance Use Topics  . Alcohol use: Never    Alcohol/week: 0.0 standard drinks    Frequency: Never  . Drug use: Never     Review of Systems  Constitutional: No fever/chills ENT: No upper respiratory complaints. Cardiovascular: No chest pain. Respiratory: No cough. No SOB. Gastrointestinal: No abdominal pain.  No nausea, no vomiting.  Musculoskeletal: Positive for toe pain Skin: Negative for abrasions, lacerations, ecchymosis.   ____________________________________________   PHYSICAL EXAM:  VITAL SIGNS: ED Triage Vitals  Enc Vitals Group     BP 05/26/18 0941 (!) 161/102     Pulse Rate 05/26/18 0941 97  Resp 05/26/18 0941 18     Temp 05/26/18 0941 98.6 F (37 C)     Temp Source 05/26/18 0941 Oral     SpO2 05/26/18 0941 98 %     Weight 05/26/18 0942 150 lb (68 kg)     Height 05/26/18 0942 6\' 1"  (1.854 m)     Head Circumference --      Peak Flow --      Pain Score 05/26/18 0942 10     Pain Loc --      Pain Edu? --      Excl. in [place]? --      Constitutional: Alert and oriented. Well appearing and in no acute distress. Eyes: Conjunctivae are normal. PERRL. EOMI. Head: Atraumatic. ENT:      Ears:      Nose: No  congestion/rhinnorhea.      Mouth/Throat: Mucous membranes are moist.  Neck: No stridor.   Cardiovascular: Normal rate, regular rhythm.  Good peripheral circulation.  Palpable dorsalis pedis pulses bilaterally.  Cap refill less than 2 seconds. Respiratory: Normal respiratory effort without tachypnea or retractions. Lungs CTAB. Good air entry to the bases with no decreased or absent breath sounds. Musculoskeletal: Full range of motion to all extremities. No gross deformities appreciated. Neurologic:  Normal speech and language. No gross focal neurologic deficits are appreciated.  Skin:  Skin is warm, dry and intact.  Dried callused skin to plantar second toe of right foot.  Minute amount of yellow drainage seeping to the side of callused skin.  Skin around wound appears dusky.  Skin to third and fourth toes over plantar surface appear somewhat dusky as well. Psychiatric: Mood and affect are normal. Speech and behavior are normal. Patient exhibits appropriate insight and judgement.   ____________________________________________   LABS (all labs ordered are listed, but only abnormal results are displayed)  Labs Reviewed - No data to display ____________________________________________  EKG   ____________________________________________  RADIOLOGY Robinette Haines, personally viewed and evaluated these images (plain radiographs) as part of my medical decision making, as well as reviewing the written report by the radiologist.  Dg Foot Complete Right  Result Date: 05/26/2018 CLINICAL DATA:  Pain in right first and second toes for 1 week. No injury. History of diabetes. EXAM: RIGHT FOOT COMPLETE - 3+ VIEW COMPARISON:  None. FINDINGS: Osteopenia. Mild hallux valgus deformity with secondary osteoarthritis of the first metatarsophalangeal joint. No typical findings of gouty arthritis. Suspect mild soft tissue swelling about the forefoot. 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 No acute osseous abnormality. Electronically Signed   By: Abigail Miyamoto M.D.   On: 05/26/2018 10:40    ____________________________________________    PROCEDURES  Procedure(s) performed:    Procedures    Medications  clindamycin (CLEOCIN) injection 600 mg (600 mg Intramuscular Given 05/26/18 1204)     ____________________________________________   INITIAL IMPRESSION / ASSESSMENT AND PLAN / ED COURSE  Pertinent labs & imaging results that were available during my care of the patient were reviewed by me and considered in my medical decision making (see chart for details).  Review of the [place] CSRS was performed in accordance of the [place] prior to dispensing any controlled drugs.     Patient's diagnosis is consistent with infected corn and ischemia.  The bottom of patient's second toe looks like there may be an infection underlying a large callous. I was concerned about some dusky skin surrounding the callus so I consulted Dr. Jacqualine Code.  Dr. Jacqualine Code came to personally evaluate the patient.  He recommended consulting vascular surgery for close follow-up and further testing.  Dr. Lorenso Courier was called and recommends following up with her in clinic next week for further testing.  IM clindamycin was given.  Patient is already taking daily aspirin.  Dr. Jacqualine Code was updated with plan of care and is agreeable.  Patient will be discharged home with prescriptions for clindamycin. Patient is to follow up with vascular surgery and wound clinic as directed. Patient is given ED precautions to return to the ED for any worsening or new symptoms.     ____________________________________________  FINAL CLINICAL IMPRESSION(S) / ED DIAGNOSES  Final diagnoses:  Corn infected  Ischemia of toe      NEW MEDICATIONS STARTED DURING THIS VISIT:  ED Discharge Orders         Ordered    clindamycin (CLEOCIN) 300 MG capsule  3 times daily     05/26/18 1146               This chart was dictated using voice recognition software/Dragon. Despite best efforts to proofread, errors can occur which can change the meaning. Any change was purely unintentional.    Laban Emperor, PA-C 05/26/18 1708    [name], MD 05/27/18 1215

## 2018-05-26 NOTE — ED Notes (Signed)
No diabetic ulcers noted, no bleeding noted. 3rd Toe appears swollen to this RN. Pt denies stepping on anything or injuring R foot.

## 2018-05-27 ENCOUNTER — Other Ambulatory Visit (INDEPENDENT_AMBULATORY_CARE_PROVIDER_SITE_OTHER): Payer: Self-pay | Admitting: Nurse Practitioner

## 2018-05-28 ENCOUNTER — Other Ambulatory Visit: Payer: Self-pay

## 2018-05-28 ENCOUNTER — Encounter
Admission: RE | Admit: 2018-05-28 | Discharge: 2018-05-28 | Disposition: A | Discharge status: Home / Self Care | Payer: Medicare Other | Source: Ambulatory Visit | Attending: Vascular Surgery | Admitting: Vascular Surgery

## 2018-05-28 DIAGNOSIS — R9431 Abnormal electrocardiogram [ECG] [EKG]: Secondary | ICD-10-CM | POA: Diagnosis not present

## 2018-05-28 DIAGNOSIS — Z01818 Encounter for other preprocedural examination: Secondary | ICD-10-CM | POA: Diagnosis present

## 2018-05-28 DIAGNOSIS — N19 Unspecified kidney failure: Secondary | ICD-10-CM | POA: Diagnosis not present

## 2018-05-28 HISTORY — DX: Cerebral infarction, unspecified: I63.9

## 2018-05-28 HISTORY — DX: Chronic kidney disease, unspecified: N18.9

## 2018-05-28 HISTORY — DX: Hyperlipidemia, unspecified: E78.5

## 2018-05-28 HISTORY — DX: Gout, unspecified: M10.9

## 2018-05-28 HISTORY — DX: Unspecified kidney failure: N19

## 2018-05-28 HISTORY — DX: Unspecified glaucoma: H40.9

## 2018-05-28 HISTORY — DX: Major depressive disorder, single episode, unspecified: F32.9

## 2018-05-28 HISTORY — DX: Depression, unspecified: F32.A

## 2018-05-28 LAB — CBC WITH DIFFERENTIAL/PLATELET
Abs Immature Granulocytes: 0.02 10*3/uL (ref 0.00–0.07)
Basophils Absolute: 0.1 10*3/uL (ref 0.0–0.1)
Basophils Relative: 1 %
Eosinophils Absolute: 0.9 10*3/uL — ABNORMAL HIGH (ref 0.0–0.5)
Eosinophils Relative: 10 %
HEMATOCRIT: 30.8 % — AB (ref 39.0–52.0)
Hemoglobin: 10 g/dL — ABNORMAL LOW (ref 13.0–17.0)
Immature Granulocytes: 0 %
LYMPHS ABS: 2.5 10*3/uL (ref 0.7–4.0)
Lymphocytes Relative: 29 %
MCH: 31.9 pg (ref 26.0–34.0)
MCHC: 32.5 g/dL (ref 30.0–36.0)
MCV: 98.4 fL (ref 80.0–100.0)
Monocytes Absolute: 0.9 10*3/uL (ref 0.1–1.0)
Monocytes Relative: 11 %
Neutro Abs: 4.3 10*3/uL (ref 1.7–7.7)
Neutrophils Relative %: 49 %
Platelets: 153 10*3/uL (ref 150–400)
RBC: 3.13 MIL/uL — ABNORMAL LOW (ref 4.22–5.81)
RDW: 14.7 % (ref 11.5–15.5)
WBC: 8.6 10*3/uL (ref 4.0–10.5)
nRBC: 0 % (ref 0.0–0.2)

## 2018-05-28 LAB — BASIC METABOLIC PANEL
Anion gap: 9 (ref 5–15)
BUN: 33 mg/dL — ABNORMAL HIGH (ref 8–23)
CO2: 29 mmol/L (ref 22–32)
Calcium: 8.7 mg/dL — ABNORMAL LOW (ref 8.9–10.3)
Chloride: 99 mmol/L (ref 98–111)
Creatinine, Ser: 4.62 mg/dL — ABNORMAL HIGH (ref 0.61–1.24)
GFR calc Af Amer: 14 mL/min — ABNORMAL LOW (ref >60)
GFR calc non Af Amer: 12 mL/min — ABNORMAL LOW (ref >60)
Glucose, Bld: 94 mg/dL (ref 70–99)
Potassium: 3.4 mmol/L — ABNORMAL LOW (ref 3.5–5.1)
Sodium: 137 mmol/L (ref 135–145)

## 2018-05-28 LAB — TYPE AND SCREEN
ABO/RH(D): A POS
ANTIBODY SCREEN: NEGATIVE

## 2018-05-28 LAB — APTT: aPTT: 31 seconds (ref 24–36)

## 2018-05-28 LAB — PROTIME-INR
INR: 1.02
Prothrombin Time: 13.3 seconds (ref 11.4–15.2)

## 2018-05-28 NOTE — Patient Instructions (Signed)
Your procedure is scheduled on: Wednesday 06/09/18 Report to [facility]. To find out your arrival time please call [phone] between 1PM - 3PM on Tuesday 06/08/18.  Remember: Instructions that are not followed completely may result in serious medical risk, up to and including death, or upon the discretion of your surgeon and anesthesiologist your surgery may need to be rescheduled.     _X__ 1. Do not eat food after midnight the night before your procedure.                 No gum chewing or hard candies. You may drink clear liquids up to 2 hours                 before you are scheduled to arrive for your surgery- DO not drink clear                 liquids within 2 hours of the start of your surgery.                 Clear Liquids include:  water, apple juice without pulp, clear carbohydrate                 drink such as Clearfast or Gatorade, Black Coffee or Tea (Do not add                 anything to coffee or tea).  __X__2.  On the morning of surgery brush your teeth with toothpaste and water, you  may rinse your mouth with mouthwash if you wish.  Do not swallow any  toothpaste of mouthwash.     _X__ 3.  No Alcohol for 24 hours before or after surgery.   _X__ 4.  Do Not Smoke or use e-cigarettes For 24 Hours Prior to Your Surgery.                 Do not use any chewable tobacco products for at least 6 hours prior to                 surgery.  ____  5.  Bring all medications with you on the day of surgery if instructed.   __X__  6.  Notify your doctor if there is any change in your medical condition      (cold, fever, infections).     Do not wear jewelry, make-up, hairpins, clips or nail polish. Do not wear lotions, powders, or perfumes.  Do not shave 48 hours prior to surgery. Men may shave face and neck. Do not bring valuables to the hospital.    [facility] is not responsible for any belongings or valuables.  Contacts,  dentures/partials or body piercings may not be worn into surgery. Bring a case for your contacts, glasses or hearing aids, a denture cup will be supplied. Leave your suitcase in the car. After surgery it may be brought to your room. For patients admitted to the hospital, discharge time is determined by your treatment team.   Patients discharged the day of surgery will not be allowed to drive home.   Please read over the following fact sheets that you were given:   MRSA Information  __X__ Take these medicines the morning of surgery with A SIP OF WATER:    1. allopurinol (ZYLOPRIM)  2. amLODipine (NORVASC)  3. cloNIDine (CATAPRES)  4. escitalopram (LEXAPRO)   5. hydrALAZINE (APRESOLINE)  6.  ____ Fleet Enema (as  directed)   __X__ Use CHG Soap/SAGE wipes as directed  ____ Use inhalers on the day of surgery  ____ Stop metformin/Janumet/Farxiga 2 days prior to surgery    ____ Take 1/2 of usual insulin dose the night before surgery. No insulin the morning          of surgery.   ____ Stop Blood Thinners Coumadin/Plavix/Xarelto/Pleta/Pradaxa/Eliquis/Effient/Aspirin  on   Or contact your Surgeon, Cardiologist or Medical Doctor regarding  ability to stop your blood thinners  __X__ Stop Anti-inflammatories 7 days before surgery such as Advil, Ibuprofen, Motrin,  BC or Goodies Powder, Naprosyn, Naproxen, Aleve,    __X__ Stop all herbal supplements, fish oil or vitamin E until after surgery.    ____ Bring C-Pap to the hospital.

## 2018-05-31 NOTE — Pre-Procedure Instructions (Signed)
FAXED LABS AND REQUEST FOR H/P

## 2018-06-08 ENCOUNTER — Other Ambulatory Visit
Admission: RE | Admit: 2018-06-08 | Discharge: 2018-06-08 | Disposition: A | Discharge status: Home / Self Care | Payer: Medicare Other | Source: Ambulatory Visit | Attending: Nephrology | Admitting: Nephrology

## 2018-06-08 DIAGNOSIS — N186 End stage renal disease: Secondary | ICD-10-CM | POA: Diagnosis present

## 2018-06-08 LAB — POTASSIUM: Potassium: 3.9 mmol/L (ref 3.5–5.1)

## 2018-06-08 MED ORDER — CLINDAMYCIN PHOSPHATE 300 MG/50ML IV SOLN
300.0000 mg | INTRAVENOUS | Status: AC
  Administered 2018-06-09: 300 mg via INTRAVENOUS

## 2018-06-09 ENCOUNTER — Ambulatory Visit: Payer: Medicare Other | Admitting: Anesthesiology

## 2018-06-09 ENCOUNTER — Ambulatory Visit
Admission: RE | Admit: 2018-06-09 | Discharge: 2018-06-09 | Disposition: A | Discharge status: Home / Self Care | Payer: Medicare Other | Attending: Vascular Surgery | Admitting: Vascular Surgery

## 2018-06-09 ENCOUNTER — Encounter (INDEPENDENT_AMBULATORY_CARE_PROVIDER_SITE_OTHER): Payer: Self-pay

## 2018-06-09 ENCOUNTER — Encounter
Admission: RE | Disposition: A | Discharge status: Home / Self Care | Payer: Self-pay | Source: Home / Self Care | Attending: Vascular Surgery

## 2018-06-09 ENCOUNTER — Other Ambulatory Visit: Payer: Self-pay

## 2018-06-09 DIAGNOSIS — Z8673 Personal history of transient ischemic attack (TIA), and cerebral infarction without residual deficits: Secondary | ICD-10-CM | POA: Insufficient documentation

## 2018-06-09 DIAGNOSIS — F17210 Nicotine dependence, cigarettes, uncomplicated: Secondary | ICD-10-CM | POA: Insufficient documentation

## 2018-06-09 DIAGNOSIS — Z880 Allergy status to penicillin: Secondary | ICD-10-CM | POA: Diagnosis not present

## 2018-06-09 DIAGNOSIS — E119 Type 2 diabetes mellitus without complications: Secondary | ICD-10-CM

## 2018-06-09 DIAGNOSIS — N179 Acute kidney failure, unspecified: Secondary | ICD-10-CM | POA: Insufficient documentation

## 2018-06-09 DIAGNOSIS — M8580 Other specified disorders of bone density and structure, unspecified site: Secondary | ICD-10-CM | POA: Diagnosis not present

## 2018-06-09 DIAGNOSIS — E1122 Type 2 diabetes mellitus with diabetic chronic kidney disease: Secondary | ICD-10-CM | POA: Diagnosis not present

## 2018-06-09 DIAGNOSIS — E785 Hyperlipidemia, unspecified: Secondary | ICD-10-CM | POA: Insufficient documentation

## 2018-06-09 DIAGNOSIS — E11621 Type 2 diabetes mellitus with foot ulcer: Secondary | ICD-10-CM | POA: Diagnosis not present

## 2018-06-09 DIAGNOSIS — Z992 Dependence on renal dialysis: Secondary | ICD-10-CM | POA: Diagnosis not present

## 2018-06-09 DIAGNOSIS — L97519 Non-pressure chronic ulcer of other part of right foot with unspecified severity: Secondary | ICD-10-CM | POA: Diagnosis not present

## 2018-06-09 DIAGNOSIS — N186 End stage renal disease: Secondary | ICD-10-CM | POA: Insufficient documentation

## 2018-06-09 DIAGNOSIS — I120 Hypertensive chronic kidney disease with stage 5 chronic kidney disease or end stage renal disease: Secondary | ICD-10-CM | POA: Insufficient documentation

## 2018-06-09 DIAGNOSIS — I70235 Atherosclerosis of native arteries of right leg with ulceration of other part of foot: Secondary | ICD-10-CM | POA: Diagnosis not present

## 2018-06-09 DIAGNOSIS — M109 Gout, unspecified: Secondary | ICD-10-CM | POA: Diagnosis not present

## 2018-06-09 DIAGNOSIS — H409 Unspecified glaucoma: Secondary | ICD-10-CM | POA: Insufficient documentation

## 2018-06-09 HISTORY — PX: AV FISTULA PLACEMENT: SHX1204

## 2018-06-09 LAB — POCT I-STAT 4, (NA,K, GLUC, HGB,HCT)
Glucose, Bld: 91 mg/dL (ref 70–99)
HCT: 32 % — ABNORMAL LOW (ref 39.0–52.0)
Hemoglobin: 10.9 g/dL — ABNORMAL LOW (ref 13.0–17.0)
Potassium: 3.4 mmol/L — ABNORMAL LOW (ref 3.5–5.1)
SODIUM: 142 mmol/L (ref 135–145)

## 2018-06-09 LAB — GLUCOSE, CAPILLARY
Glucose-Capillary: 95 mg/dL (ref 70–99)
Glucose-Capillary: 81 mg/dL (ref 70–99)

## 2018-06-09 LAB — ABO/RH: ABO/RH(D): A POS

## 2018-06-09 SURGERY — ARTERIOVENOUS (AV) FISTULA CREATION
Anesthesia: General | Laterality: Left

## 2018-06-09 MED ORDER — BUPIVACAINE-EPINEPHRINE (PF) 0.5% -1:200000 IJ SOLN
INTRAMUSCULAR | Status: AC
  Filled 2018-06-09: qty 30

## 2018-06-09 MED ORDER — PHENYLEPHRINE HCL 10 MG/ML IJ SOLN
INTRAMUSCULAR | Status: DC | PRN
  Administered 2018-06-09 (×4): 100 ug via INTRAVENOUS

## 2018-06-09 MED ORDER — HYDROCODONE-ACETAMINOPHEN 5-325 MG PO TABS
1.0000 | ORAL_TABLET | Freq: Once | ORAL | Status: AC
  Administered 2018-06-09: 1 via ORAL

## 2018-06-09 MED ORDER — PAPAVERINE HCL 30 MG/ML IJ SOLN
INTRAMUSCULAR | Status: AC
  Filled 2018-06-09: qty 2

## 2018-06-09 MED ORDER — CLINDAMYCIN PHOSPHATE 300 MG/50ML IV SOLN
INTRAVENOUS | Status: AC
  Filled 2018-06-09: qty 50

## 2018-06-09 MED ORDER — FAMOTIDINE 20 MG PO TABS: 20.0000 mg | ORAL_TABLET | Freq: Once | ORAL | Status: DC

## 2018-06-09 MED ORDER — FENTANYL CITRATE (PF) 100 MCG/2ML IJ SOLN
INTRAMUSCULAR | Status: AC
  Administered 2018-06-09: 25 ug via INTRAVENOUS
  Filled 2018-06-09: qty 2

## 2018-06-09 MED ORDER — HEPARIN SODIUM (PORCINE) 5000 UNIT/ML IJ SOLN
INTRAMUSCULAR | Status: AC
  Filled 2018-06-09: qty 1

## 2018-06-09 MED ORDER — SODIUM CHLORIDE 0.9 % IV SOLN
INTRAVENOUS | Status: DC
  Administered 2018-06-09: 12:00:00 via INTRAVENOUS

## 2018-06-09 MED ORDER — HYDROCODONE-ACETAMINOPHEN 5-325 MG PO TABS: 1.0000 | ORAL_TABLET | Freq: Four times a day (QID) | ORAL | 0 refills | Status: DC | PRN

## 2018-06-09 MED ORDER — PROPOFOL 500 MG/50ML IV EMUL
INTRAVENOUS | Status: AC
  Filled 2018-06-09: qty 50

## 2018-06-09 MED ORDER — BUPIVACAINE-EPINEPHRINE (PF) 0.5% -1:200000 IJ SOLN
INTRAMUSCULAR | Status: DC | PRN
  Administered 2018-06-09: 10 mL

## 2018-06-09 MED ORDER — MIDAZOLAM HCL 2 MG/2ML IJ SOLN
INTRAMUSCULAR | Status: AC
  Filled 2018-06-09: qty 2

## 2018-06-09 MED ORDER — PROPOFOL 10 MG/ML IV BOLUS
INTRAVENOUS | Status: DC | PRN
  Administered 2018-06-09: 40 mg via INTRAVENOUS

## 2018-06-09 MED ORDER — ROPIVACAINE HCL 5 MG/ML IJ SOLN
INTRAMUSCULAR | Status: AC
  Filled 2018-06-09: qty 30

## 2018-06-09 MED ORDER — HEPARIN SODIUM (PORCINE) 1000 UNIT/ML IJ SOLN
INTRAMUSCULAR | Status: DC | PRN
  Administered 2018-06-09: 3000 [IU] via INTRAVENOUS

## 2018-06-09 MED ORDER — PROPOFOL 500 MG/50ML IV EMUL
INTRAVENOUS | Status: DC | PRN
  Administered 2018-06-09: 125 ug/kg/min via INTRAVENOUS

## 2018-06-09 MED ORDER — FENTANYL CITRATE (PF) 100 MCG/2ML IJ SOLN
INTRAMUSCULAR | Status: AC
  Filled 2018-06-09: qty 2

## 2018-06-09 MED ORDER — SODIUM CHLORIDE 0.9 % IV SOLN
INTRAVENOUS | Status: DC | PRN
  Administered 2018-06-09: 20 mL via INTRAMUSCULAR

## 2018-06-09 MED ORDER — FENTANYL CITRATE (PF) 100 MCG/2ML IJ SOLN
25.0000 ug | Freq: Once | INTRAMUSCULAR | Status: AC
  Administered 2018-06-09: 25 ug via INTRAVENOUS

## 2018-06-09 MED ORDER — FENTANYL CITRATE (PF) 100 MCG/2ML IJ SOLN
INTRAMUSCULAR | Status: DC | PRN
  Administered 2018-06-09: 25 ug via INTRAVENOUS

## 2018-06-09 MED ORDER — BUPIVACAINE HCL (PF) 0.5 % IJ SOLN
INTRAMUSCULAR | Status: AC
  Filled 2018-06-09: qty 10

## 2018-06-09 MED ORDER — FENTANYL CITRATE (PF) 100 MCG/2ML IJ SOLN: 25.0000 ug | INTRAMUSCULAR | Status: DC | PRN

## 2018-06-09 MED ORDER — LIDOCAINE HCL (PF) 1 % IJ SOLN
INTRAMUSCULAR | Status: AC
  Filled 2018-06-09: qty 5

## 2018-06-09 MED ORDER — BUPIVACAINE LIPOSOME 1.3 % IJ SUSP
INTRAMUSCULAR | Status: AC
  Filled 2018-06-09: qty 20

## 2018-06-09 MED ORDER — HYDROCODONE-ACETAMINOPHEN 5-325 MG PO TABS
ORAL_TABLET | ORAL | Status: AC
  Administered 2018-06-09: 1 via ORAL
  Filled 2018-06-09: qty 1

## 2018-06-09 MED ORDER — ROPIVACAINE HCL 5 MG/ML IJ SOLN
INTRAMUSCULAR | Status: DC | PRN
  Administered 2018-06-09: 5 mL via EPIDURAL
  Administered 2018-06-09 (×2): 10 mL via EPIDURAL

## 2018-06-09 SURGICAL SUPPLY — 56 items
BAG DECANTER FOR FLEXI CONT (MISCELLANEOUS) ×3 IMPLANT
BLADE SURG SZ11 CARB STEEL (BLADE) ×3 IMPLANT
BOOT SUTURE AID YELLOW STND (SUTURE) ×3 IMPLANT
BRUSH SCRUB EZ  4% CHG (MISCELLANEOUS) ×2
BRUSH SCRUB EZ 4% CHG (MISCELLANEOUS) ×1 IMPLANT
CANISTER SUCT 1200ML W/VALVE (MISCELLANEOUS) ×3 IMPLANT
CHLORAPREP W/TINT 26ML (MISCELLANEOUS) ×3 IMPLANT
CLIP SPRNG 6 S-JAW DBL (CLIP) ×1 IMPLANT
CLIP SPRNG 6MM S-JAW DBL (CLIP) ×3
COVER WAND RF STERILE (DRAPES) ×1 IMPLANT
DERMABOND ADVANCED (GAUZE/BANDAGES/DRESSINGS) ×2
DERMABOND ADVANCED .7 DNX12 (GAUZE/BANDAGES/DRESSINGS) ×1 IMPLANT
ELECT CAUTERY BLADE 6.4 (BLADE) ×3 IMPLANT
ELECT REM PT RETURN 9FT ADLT (ELECTROSURGICAL) ×3
ELECTRODE REM PT RTRN 9FT ADLT (ELECTROSURGICAL) ×1 IMPLANT
GEL ULTRASOUND 20GR AQUASONIC (MISCELLANEOUS) IMPLANT
GLOVE BIO SURGEON STRL SZ7 (GLOVE) ×8 IMPLANT
GLOVE INDICATOR 7.5 STRL GRN (GLOVE) ×3 IMPLANT
GOWN STRL REUS W/ TWL LRG LVL3 (GOWN DISPOSABLE) ×1 IMPLANT
GOWN STRL REUS W/ TWL XL LVL3 (GOWN DISPOSABLE) ×2 IMPLANT
GOWN STRL REUS W/TWL LRG LVL3 (GOWN DISPOSABLE) ×2
GOWN STRL REUS W/TWL XL LVL3 (GOWN DISPOSABLE) ×2
HEMOSTAT SURGICEL 2X3 (HEMOSTASIS) ×3 IMPLANT
IV NS 500ML (IV SOLUTION) ×2
IV NS 500ML BAXH (IV SOLUTION) ×1 IMPLANT
KIT TURNOVER KIT A (KITS) ×3 IMPLANT
LABEL OR SOLS (LABEL) ×3 IMPLANT
LOOP RED MAXI  1X406MM (MISCELLANEOUS) ×2
LOOP VESSEL MAXI 1X406 RED (MISCELLANEOUS) ×1 IMPLANT
LOOP VESSEL MINI 0.8X406 BLUE (MISCELLANEOUS) ×1 IMPLANT
LOOPS BLUE MINI 0.8X406MM (MISCELLANEOUS) ×2
NDL FILTER BLUNT 18X1 1/2 (NEEDLE) ×1 IMPLANT
NDL HYPO 30X.5 LL (NEEDLE) IMPLANT
NEEDLE FILTER BLUNT 18X 1/2SAF (NEEDLE) ×2
NEEDLE FILTER BLUNT 18X1 1/2 (NEEDLE) ×1 IMPLANT
NEEDLE HYPO 30X.5 LL (NEEDLE) IMPLANT
NS IRRIG 500ML POUR BTL (IV SOLUTION) ×3 IMPLANT
PACK EXTREMITY ARMC (MISCELLANEOUS) ×3 IMPLANT
PAD PREP 24X41 OB/GYN DISP (PERSONAL CARE ITEMS) ×3 IMPLANT
SOLUTION CELL SAVER (CLIP) ×1 IMPLANT
STOCKINETTE 48X4 2 PLY STRL (GAUZE/BANDAGES/DRESSINGS) ×1 IMPLANT
STOCKINETTE STRL 4IN 9604848 (GAUZE/BANDAGES/DRESSINGS) ×3 IMPLANT
SUT MNCRL AB 4-0 PS2 18 (SUTURE) ×3 IMPLANT
SUT PROLENE 6 0 BV (SUTURE) ×8 IMPLANT
SUT SILK 2 0 (SUTURE) ×2
SUT SILK 2-0 18XBRD TIE 12 (SUTURE) ×1 IMPLANT
SUT SILK 3 0 (SUTURE) ×2
SUT SILK 3-0 18XBRD TIE 12 (SUTURE) ×1 IMPLANT
SUT SILK 4 0 (SUTURE) ×2
SUT SILK 4-0 18XBRD TIE 12 (SUTURE) ×1 IMPLANT
SUT VIC AB 3-0 SH 27 (SUTURE) ×2
SUT VIC AB 3-0 SH 27X BRD (SUTURE) ×2 IMPLANT
SYR 20CC LL (SYRINGE) ×3 IMPLANT
SYR 3ML LL SCALE MARK (SYRINGE) ×3 IMPLANT
SYR TB 1ML 27GX1/2 LL (SYRINGE) IMPLANT
TOWEL OR 17X26 4PK STRL BLUE (TOWEL DISPOSABLE) IMPLANT

## 2018-06-09 NOTE — Op Note (Signed)
[facility]   OPERATIVE NOTE   PROCEDURE: Left brachiocephalic arteriovenous fistula placement  PRE-OPERATIVE DIAGNOSIS: 1.  ESRD      2. PAD with ulceration right foot  POST-OPERATIVE DIAGNOSIS: 1. ESRD     2. PAD with ulceration right foot  SURGEON: Leotis Pain, MD  ASSISTANT(S): none  ANESTHESIA: general  ESTIMATED BLOOD LOSS: 15 cc  FINDING(S): Adequate cephalic vein for fistula creation  SPECIMEN(S):  none  INDICATIONS:   Jose [name] is a 73 y.o. male who presents with renal failure in need of pemanent dialysis acces.  The patient is scheduled for left arm AVF placement.  The patient is aware the risks include but are not limited to: bleeding, infection, steal syndrome, nerve damage, ischemic monomelic neuropathy, failure to mature, and need for additional procedures.  The patient is aware of the risks of the procedure and elects to proceed forward.  DESCRIPTION: After full informed written consent was obtained from the patient, the patient was brought back to the operating room and placed supine upon the operating table.  Prior to induction, the patient received IV antibiotics.   After obtaining adequate anesthesia, the patient was then prepped and draped in the standard fashion for a left arm access procedure.  I made a curvilinear incision at the level of the antecubital fossa and dissected through the subcutaneous tissue and fascia to gain exposure of the brachial artery.  This was noted to be patent and adequate in size for fistula creation.  This was dissected out proximally and distally and prepared for control with vessel loops .  I then dissected out the cephalic vein.  This was noted to be patent and adequate in size for fistula creation.  I then gave the patient 3000 units of intravenous heparin.  The vein was marked for orientation and the distal segment of the vein was ligated with a  2-0 silk, and the vein was transected.  I then instilled  the heparinized saline into the vein and clamped it.  At this point, I reset my exposure of the brachial artery and pulled up control on the vessel loops.  I made an arteriotomy with a #11 blade, and then I extended the arteriotomy with a Potts scissor.  I injected heparinized saline proximal and distal to this arteriotomy.  The vein was then sewn to the artery in an end-to-side configuration with a running stitch of 6-0 Prolene.  Prior to completing this anastomosis, I allowed the vein and artery to backbleed.  There was no evidence of clot from any vessels.  I completed the anastomosis in the usual fashion and then released all vessel loops and clamps.  There was a palpable  thrill in the venous outflow, and there was a palpable pulse in the artery distal to the anastomosis.  At this point, I irrigated out the surgical wound.  Surgicel was placed. There was no further active bleeding.  The subcutaneous tissue was reapproximated with a running stitch of 3-0 Vicryl.  The skin was then closed with a 4-0 Monocryl suture.  The skin was then cleaned, dried, and reinforced with Dermabond.  The patient tolerated this procedure well and was taken to the recovery room in stable condition  COMPLICATIONS: None  CONDITION: Stable   Leotis Pain    06/09/2018, 1:55 PM  This note was created with Dragon Medical transcription system. Any errors in dictation are purely unintentional.

## 2018-06-09 NOTE — Anesthesia Preprocedure Evaluation (Addendum)
Anesthesia Evaluation  Patient identified by MRN, date of birth, ID band Patient awake    Reviewed: Allergy & Precautions, H&P , NPO status , Patient's Chart, lab work & pertinent test results  Airway Mallampati: III       Dental  (+) Missing, Chipped   Pulmonary neg shortness of breath, neg COPD, Current Smoker,           Cardiovascular hypertension, (-) angina(-) Past MI and (-) Cardiac Stents (-) dysrhythmias (denies history of rhythm problems)  Rhythm:irregular Rate:Normal  Echo 03/22/18: - Left ventricle: The cavity size was normal. There was moderate   concentric hypertrophy. Systolic function was normal. The   estimated ejection fraction was in the range of 55% to 60%. Wall   motion was normal; there were no regional wall motion   abnormalities. Features are consistent with a pseudonormal left   ventricular filling pattern, with concomitant abnormal relaxation   and increased filling pressure (grade 2 diastolic dysfunction). - Aortic valve: There was mild regurgitation. - Mitral valve: There was mild regurgitation. - Left atrium: The atrium was moderately dilated. - Tricuspid valve: There was mild-moderate regurgitation. - Pulmonary arteries: Systolic pressure was moderately increased.   PA peak pressure: 55 mm Hg (S). - Inferior vena cava: The vessel was dilated. The respirophasic   diameter changes were blunted (< 50%), consistent with elevated   central venous pressure. - Pericardium, extracardiac: A small pericardial effusion was   identified posterior to the heart. There was a left pleural   effusion.   Neuro/Psych PSYCHIATRIC DISORDERS Depression CVA    GI/Hepatic negative GI ROS, Neg liver ROS,   Endo/Other  negative endocrine ROSdiabetes  Renal/GU ESRFRenal disease     Musculoskeletal   Abdominal   Peds  Hematology negative hematology ROS (+)   Anesthesia Other Findings Irregular HR noted on  exam.  EKG obtained  - appears to be sinus rhythm with frequent PVCs/PACs.  Pt is asymptomatic - no angina, SOB, has been feeling well apart from sore on right 3rd toe.  Past Medical History: No date: Chronic kidney disease No date: Depression No date: Diabetes mellitus without complication (HCC) No date: Glaucoma No date: Gout No date: HLD (hyperlipidemia) No date: Hypertension No date: Prostate cancer ([place]) No date: Renal failure No date: Stroke [facility])  Past Surgical History: No date: APPENDECTOMY No date: CHOLECYSTECTOMY 03/24/2018: DIALYSIS/PERMA CATHETER INSERTION; N/A     Comment:  Procedure: DIALYSIS/PERMA CATHETER INSERTION;  Surgeon:               Katha Cabal, MD;  Location: [facility];               Service: Cardiovascular;  Laterality: N/A; No date: INSERTION PROSTATE RADIATION SEED     Reproductive/Obstetrics negative OB ROS                           Anesthesia Physical Anesthesia Plan  ASA: IV  Anesthesia Plan: General ETT   Post-op Pain Management:    Induction:   PONV Risk Score and Plan: Ondansetron, Dexamethasone and Treatment may vary due to age or medical condition  Airway Management Planned:   Additional Equipment:   Intra-op Plan:   Post-operative Plan:   Informed Consent: I have reviewed the patients History and Physical, chart, labs and discussed the procedure including the risks, benefits and alternatives for the proposed anesthesia with the patient or authorized representative who has indicated his/her understanding  and acceptance.   Dental Advisory Given  Plan Discussed with: Anesthesiologist and CRNA  Anesthesia Plan Comments:        Anesthesia Quick Evaluation

## 2018-06-09 NOTE — Progress Notes (Signed)
Observed erratic pulse ox reading that usually indicates A Fib. Anesthesia was called to assess patient. Dr Amie Critchley reviewed patient chart and confirmed the patient was having PVC as indicated by today's EKG. Today's EKG findings were confirmed by a previous EKG performed on 05/28/18. Dr Amie Critchley talked with patient and patient's family about findings. Dr Amie Critchley gave direction to proceed with patient discharge.

## 2018-06-09 NOTE — Anesthesia Post-op Follow-up Note (Signed)
Anesthesia QCDR form completed.        

## 2018-06-09 NOTE — Anesthesia Postprocedure Evaluation (Signed)
Anesthesia Post Note  Patient: Egbert Seidel Moscato  Procedure(s) Performed: ARTERIOVENOUS (AV) FISTULA CREATION ( BRACHIAL CEPHALIC) (Left )  Patient location during evaluation: PACU Anesthesia Type: General Level of consciousness: awake and alert Pain management: pain level controlled Vital Signs Assessment: post-procedure vital signs reviewed and stable Respiratory status: spontaneous breathing, nonlabored ventilation, respiratory function stable and patient connected to nasal cannula oxygen Cardiovascular status: blood pressure returned to baseline and stable Postop Assessment: no apparent nausea or vomiting Anesthetic complications: no     Last Vitals:  Vitals:   06/09/18 1427 06/09/18 1435  BP: (!) 154/83 (!) 155/77  Pulse: (!) 41   Resp: 12 14  Temp:  36.8 C  SpO2: 97% 92%    Last Pain:  Vitals:   06/09/18 1435  TempSrc: Temporal  PainSc: 10-Worst pain ever                 Precious Haws Umeka Wrench

## 2018-06-09 NOTE — Anesthesia Procedure Notes (Signed)
Anesthesia Regional Block: Supraclavicular block   Pre-Anesthetic Checklist: ,, timeout performed, Correct Patient, Correct Site, Correct Laterality, Correct Procedure, Correct Position, site marked, Risks and benefits discussed, Surgical consent,  Pre-op evaluation,  Post-op pain management  Laterality: Left  Prep: chloraprep       Needles:  Injection technique: Single-shot  Needle Type: Stimiplex     Needle Length: 9cm  Needle Gauge: 21     Additional Needles:   Narrative:  Start time: 06/09/2018 12:15 PM End time: 06/09/2018 12:25 PM  Performed by: Personally  Anesthesiologist: Durenda Hurt, MD  Additional Notes: No paresthesia on injection.  Local anesthetic injected in 5 cc aliquots

## 2018-06-09 NOTE — Transfer of Care (Signed)
Immediate Anesthesia Transfer of Care Note  Patient: Jose [name]  Procedure(s) Performed: ARTERIOVENOUS (AV) FISTULA CREATION ( BRACHIAL CEPHALIC) (Left )  Patient Location: PACU  Anesthesia Type:Regional  Level of Consciousness: drowsy  Airway & Oxygen Therapy: Patient Spontanous Breathing and Patient connected to face mask oxygen  Post-op Assessment: Report given to RN and Post -op Vital signs reviewed and stable  Post vital signs: Reviewed and stable  Last Vitals:  Vitals Value Taken Time  BP 132/72 06/09/2018  1:57 PM  Temp 36.6 C 06/09/2018  1:57 PM  Pulse 34 06/09/2018  2:02 PM  Resp 10 06/09/2018  2:02 PM  SpO2 100 % 06/09/2018  2:02 PM  Vitals shown include unvalidated device data.  Last Pain:  Vitals:   06/09/18 1357  TempSrc:   PainSc: Asleep         Complications: No apparent anesthesia complications

## 2018-06-09 NOTE — H&P (Signed)
[facility] Admission History & Physical  MRN : [id number]  Jose [name] is a 73 y.o. (09-09-1945) male who presents with chief complaint of No chief complaint on file. Marland Kitchen  History of Present Illness: Patient presents today for his outpatient dialysis access placement.  He was seen about 2 months ago in the office.  His vein mapping showed adequate cephalic vein in the left arm for brachiocephalic AV fistula creation.  This is the plan for surgery today.  He is using his catheter and it is working well.  No complaints from his access. His biggest complaint today is of right third toe pain and ulceration.  The ulceration is been present for at least 2 weeks.  He was seen in the ER.  He has not had a vascular assessment or seen a podiatrist for this wound.  No fevers or chills.  It does not appear to be draining significantly.  The foot is swollen.  There was no trauma or injury that caused the ulceration.  Current Facility-Administered Medications  Medication Dose Route Frequency Provider Last Rate Last Dose  . 0.9 %  sodium chloride infusion   Intravenous Continuous Martha Clan, MD      . clindamycin (CLEOCIN) 300 MG/50ML IVPB           . clindamycin (CLEOCIN) IVPB 300 mg  300 mg Intravenous On Call to OR Kris Hartmann, NP      . famotidine (PEPCID) tablet 20 mg  20 mg Oral Once Martha Clan, MD        Past Medical History:  Diagnosis Date  . Chronic kidney disease   . Depression   . Diabetes mellitus without complication ([place])   . Glaucoma   . Gout   . HLD (hyperlipidemia)   . Hypertension   . Prostate cancer ([place])   . Renal failure   . Stroke [facility])     Past Surgical History:  Procedure Laterality Date  . APPENDECTOMY    . CHOLECYSTECTOMY    . DIALYSIS/PERMA CATHETER INSERTION N/A 03/24/2018   Procedure: DIALYSIS/PERMA CATHETER INSERTION;  Surgeon: Katha Cabal, MD;  Location: [facility];  Service: Cardiovascular;  Laterality:  N/A;  . INSERTION PROSTATE RADIATION SEED      Social History Social History   Tobacco Use  . Smoking status: Current Some Day Smoker    Packs/day: 0.10    Types: Cigarettes  . Smokeless tobacco: Never Used  Substance Use Topics  . Alcohol use: Not Currently    Alcohol/week: 0.0 standard drinks    Frequency: Never  . Drug use: Never    Family History No bleeding disorders, clotting disorders, autoimmune diseases, or aneurysms  Allergies  Allergen Reactions  . Penicillin G     Other reaction(s): Unknown     REVIEW OF SYSTEMS (Negative unless checked)  Constitutional: [] Weight loss  [] Fever  [] Chills Cardiac: [] Chest pain   [] Chest pressure   [] Palpitations   [] Shortness of breath when laying flat   [] Shortness of breath at rest   [] Shortness of breath with exertion. Vascular:  [] Pain in legs with walking   [] Pain in legs at rest   [] Pain in legs when laying flat   [] Claudication   [] Pain in feet when walking  [x] Pain in feet at rest  [x] Pain in feet when laying flat   [] History of DVT   [] Phlebitis   [] Swelling in legs   [] Varicose veins   [x] Non-healing ulcers Pulmonary:   []   Uses home oxygen   [] Productive cough   [] Hemoptysis   [] Wheeze  [] COPD   [] Asthma Neurologic:  [] Dizziness  [] Blackouts   [] Seizures   [x] History of stroke   [] History of TIA  [] Aphasia   [] Temporary blindness   [] Dysphagia   [] Weakness or numbness in arms   [] Weakness or numbness in legs Musculoskeletal:  [x] Arthritis   [] Joint swelling   [x] Joint pain   [] Low back pain Hematologic:  [] Easy bruising  [] Easy bleeding   [] Hypercoagulable state   [] Anemic  [] Hepatitis Gastrointestinal:  [] Blood in stool   [] Vomiting blood  [] Gastroesophageal reflux/heartburn   [] Difficulty swallowing. Genitourinary:  [x] Chronic kidney disease   [] Difficult urination  [] Frequent urination  [] Burning with urination   [] Blood in urine Skin:  [] Rashes   [x] Ulcers   [x] Wounds Psychological:  [] History of anxiety   []  History of  major depression.  Physical Examination  Vitals:   06/09/18 1045  BP: (!) 183/95  Pulse: 80  Resp: 17  Temp: 98.4 F (36.9 C)  TempSrc: Temporal   There is no height or weight on file to calculate BMI. Gen: WD/WN, NAD Head: [place]/AT, No temporalis wasting.  Ear/Nose/Throat: Hearing grossly intact, nares w/o erythema or drainage, oropharynx w/o Erythema/Exudate,  Eyes: Conjunctiva clear, sclera non-icteric Neck: Trachea midline.  No JVD.  Pulmonary:  Good air movement, respirations not labored, no use of accessory muscles.  Cardiac: RRR, normal S1, S2. Vascular: PermCath in place in the right chest Vessel Right Left  Radial Palpable Palpable                          PT  not palpable  not palpable  DP  not palpable  1+ palpable    Musculoskeletal: M/S 5/5 throughout.  Dry ulceration on the base of the right third toe.  The toe is somewhat dark and discolored.  There is not significant erythema or drainage.  He has 2+ right lower leg swelling.  1+ left lower leg swelling.  No deformity or atrophy.  Neurologic: Sensation grossly intact in extremities.  Symmetrical.  Speech is fluent. Motor exam as listed above. Psychiatric: Judgment intact, Mood & affect appropriate for pt's clinical situation. Dermatologic: Right third toe ulceration as above     CBC Lab Results  Component Value Date   WBC 8.6 05/28/2018   HGB 10.9 (L) 06/09/2018   HCT 32.0 (L) 06/09/2018   MCV 98.4 05/28/2018   PLT 153 05/28/2018    BMET    Component Value Date/Time   NA 142 06/09/2018 1041   NA 141 08/18/2011 1054   K 3.4 (L) 06/09/2018 1041   K 4.5 08/18/2011 1054   CL 99 05/28/2018 1412   CL 108 (H) 08/18/2011 1054   CO2 29 05/28/2018 1412   CO2 23 08/18/2011 1054   GLUCOSE 91 06/09/2018 1041   GLUCOSE 119 (H) 08/18/2011 1054   BUN 33 (H) 05/28/2018 1412   BUN 25 (H) 08/18/2011 1054   CREATININE 4.62 (H) 05/28/2018 1412   CREATININE 2.10 (H) 08/18/2011 1054   CALCIUM 8.7 (L)  05/28/2018 1412   CALCIUM 9.0 08/18/2011 1054   GFRNONAA 12 (L) 05/28/2018 1412   GFRNONAA 34 (L) 08/18/2011 1054   GFRAA 14 (L) 05/28/2018 1412   GFRAA 41 (L) 08/18/2011 1054   Estimated Creatinine Clearance: 14.9 mL/min (A) (by C-G formula based on SCr of 4.62 mg/dL (H)).  COAG Lab Results  Component Value Date   INR 1.02 05/28/2018  Radiology Dg Foot Complete Right  Result Date: 05/26/2018 CLINICAL DATA:  Pain in right first and second toes for 1 week. No injury. History of diabetes. EXAM: RIGHT FOOT COMPLETE - 3+ VIEW COMPARISON:  None. FINDINGS: Osteopenia. Mild hallux valgus deformity with secondary osteoarthritis of the first metatarsophalangeal joint. No typical findings of gouty arthritis. Suspect mild soft tissue swelling about the forefoot. 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 No acute osseous abnormality. Electronically Signed   By: Abigail Miyamoto M.D.   On: 05/26/2018 10:40     Assessment/Plan 1.  ESRD.  Plan is for left brachiocephalic AV fistula creation today.  Risks and benefits are discussed.  The patient is agreeable to proceed. 2.  Ulceration of the right third toe.  This is likely associated with significant peripheral arterial disease.  I would recommend an angiogram with possible revascularization and I will schedule this as an outpatient for next week.  He should also have a referral to podiatry and this can be arranged as an outpatient as well.  This is clearly a critical and limb threatening situation that is very worrisome.  This is particularly worrisome in a diabetic renal failure patient. 3.  Diabetes.  Stable on outpatient medications and blood glucose control important in reducing the progression of atherosclerotic disease. Also, involved in wound healing. On appropriate medications. 4.  Hypertension.  Stable on outpatient medications and blood pressure control important in reducing the  progression of atherosclerotic disease. On appropriate oral medications.     Leotis Pain, MD  06/09/2018 12:15 PM

## 2018-06-10 ENCOUNTER — Encounter: Payer: Self-pay | Admitting: Vascular Surgery

## 2018-06-12 ENCOUNTER — Emergency Department: Payer: Medicare Other

## 2018-06-12 ENCOUNTER — Inpatient Hospital Stay
Admission: EM | Admit: 2018-06-12 | Discharge: 2018-06-17 | DRG: 252 | Disposition: A | Discharge status: Home Health | Payer: Medicare Other | Attending: Internal Medicine | Admitting: Internal Medicine

## 2018-06-12 ENCOUNTER — Other Ambulatory Visit: Payer: Self-pay

## 2018-06-12 ENCOUNTER — Encounter: Payer: Self-pay | Admitting: Emergency Medicine

## 2018-06-12 DIAGNOSIS — Z992 Dependence on renal dialysis: Secondary | ICD-10-CM

## 2018-06-12 DIAGNOSIS — I480 Paroxysmal atrial fibrillation: Secondary | ICD-10-CM | POA: Diagnosis present

## 2018-06-12 DIAGNOSIS — F329 Major depressive disorder, single episode, unspecified: Secondary | ICD-10-CM | POA: Diagnosis present

## 2018-06-12 DIAGNOSIS — N2581 Secondary hyperparathyroidism of renal origin: Secondary | ICD-10-CM | POA: Diagnosis present

## 2018-06-12 DIAGNOSIS — L97519 Non-pressure chronic ulcer of other part of right foot with unspecified severity: Secondary | ICD-10-CM | POA: Diagnosis present

## 2018-06-12 DIAGNOSIS — E785 Hyperlipidemia, unspecified: Secondary | ICD-10-CM | POA: Diagnosis present

## 2018-06-12 DIAGNOSIS — M7989 Other specified soft tissue disorders: Secondary | ICD-10-CM | POA: Diagnosis not present

## 2018-06-12 DIAGNOSIS — E1152 Type 2 diabetes mellitus with diabetic peripheral angiopathy with gangrene: Principal | ICD-10-CM | POA: Diagnosis present

## 2018-06-12 DIAGNOSIS — I96 Gangrene, not elsewhere classified: Secondary | ICD-10-CM | POA: Diagnosis present

## 2018-06-12 DIAGNOSIS — I10 Essential (primary) hypertension: Secondary | ICD-10-CM | POA: Diagnosis present

## 2018-06-12 DIAGNOSIS — I70203 Unspecified atherosclerosis of native arteries of extremities, bilateral legs: Secondary | ICD-10-CM | POA: Diagnosis present

## 2018-06-12 DIAGNOSIS — E1122 Type 2 diabetes mellitus with diabetic chronic kidney disease: Secondary | ICD-10-CM | POA: Diagnosis present

## 2018-06-12 DIAGNOSIS — Z880 Allergy status to penicillin: Secondary | ICD-10-CM

## 2018-06-12 DIAGNOSIS — Z79899 Other long term (current) drug therapy: Secondary | ICD-10-CM

## 2018-06-12 DIAGNOSIS — I77819 Aortic ectasia, unspecified site: Secondary | ICD-10-CM | POA: Diagnosis present

## 2018-06-12 DIAGNOSIS — I493 Ventricular premature depolarization: Secondary | ICD-10-CM | POA: Diagnosis present

## 2018-06-12 DIAGNOSIS — E11621 Type 2 diabetes mellitus with foot ulcer: Secondary | ICD-10-CM | POA: Diagnosis present

## 2018-06-12 DIAGNOSIS — H409 Unspecified glaucoma: Secondary | ICD-10-CM | POA: Diagnosis present

## 2018-06-12 DIAGNOSIS — E1129 Type 2 diabetes mellitus with other diabetic kidney complication: Secondary | ICD-10-CM

## 2018-06-12 DIAGNOSIS — D631 Anemia in chronic kidney disease: Secondary | ICD-10-CM | POA: Diagnosis present

## 2018-06-12 DIAGNOSIS — I120 Hypertensive chronic kidney disease with stage 5 chronic kidney disease or end stage renal disease: Secondary | ICD-10-CM | POA: Diagnosis present

## 2018-06-12 DIAGNOSIS — Z8673 Personal history of transient ischemic attack (TIA), and cerebral infarction without residual deficits: Secondary | ICD-10-CM

## 2018-06-12 DIAGNOSIS — I70229 Atherosclerosis of native arteries of extremities with rest pain, unspecified extremity: Secondary | ICD-10-CM

## 2018-06-12 DIAGNOSIS — E119 Type 2 diabetes mellitus without complications: Secondary | ICD-10-CM

## 2018-06-12 DIAGNOSIS — L089 Local infection of the skin and subcutaneous tissue, unspecified: Secondary | ICD-10-CM | POA: Diagnosis present

## 2018-06-12 DIAGNOSIS — N186 End stage renal disease: Secondary | ICD-10-CM | POA: Diagnosis present

## 2018-06-12 DIAGNOSIS — M79602 Pain in left arm: Secondary | ICD-10-CM | POA: Diagnosis not present

## 2018-06-12 DIAGNOSIS — I70209 Unspecified atherosclerosis of native arteries of extremities, unspecified extremity: Secondary | ICD-10-CM | POA: Diagnosis present

## 2018-06-12 DIAGNOSIS — F17210 Nicotine dependence, cigarettes, uncomplicated: Secondary | ICD-10-CM | POA: Diagnosis present

## 2018-06-12 DIAGNOSIS — M109 Gout, unspecified: Secondary | ICD-10-CM | POA: Diagnosis present

## 2018-06-12 DIAGNOSIS — E11628 Type 2 diabetes mellitus with other skin complications: Secondary | ICD-10-CM

## 2018-06-12 DIAGNOSIS — Z8546 Personal history of malignant neoplasm of prostate: Secondary | ICD-10-CM

## 2018-06-12 DIAGNOSIS — Z7982 Long term (current) use of aspirin: Secondary | ICD-10-CM

## 2018-06-12 LAB — COMPREHENSIVE METABOLIC PANEL
ALT: 15 U/L (ref 0–44)
AST: 25 U/L (ref 15–41)
Albumin: 3.7 g/dL (ref 3.5–5.0)
Alkaline Phosphatase: 40 U/L (ref 38–126)
Anion gap: 11 (ref 5–15)
BILIRUBIN TOTAL: 0.3 mg/dL (ref 0.3–1.2)
BUN: 13 mg/dL (ref 8–23)
CO2: 30 mmol/L (ref 22–32)
CREATININE: 2.54 mg/dL — AB (ref 0.61–1.24)
Calcium: 8.4 mg/dL — ABNORMAL LOW (ref 8.9–10.3)
Chloride: 98 mmol/L (ref 98–111)
GFR calc Af Amer: 28 mL/min — ABNORMAL LOW (ref >60)
GFR calc non Af Amer: 24 mL/min — ABNORMAL LOW (ref >60)
Glucose, Bld: 84 mg/dL (ref 70–99)
Potassium: 3.1 mmol/L — ABNORMAL LOW (ref 3.5–5.1)
Sodium: 139 mmol/L (ref 135–145)
Total Protein: 7.6 g/dL (ref 6.5–8.1)

## 2018-06-12 LAB — CG4 I-STAT (LACTIC ACID): Lactic Acid, Venous: 1.04 mmol/L (ref 0.5–1.9)

## 2018-06-12 LAB — CBC WITH DIFFERENTIAL/PLATELET
Abs Immature Granulocytes: 0.04 10*3/uL (ref 0.00–0.07)
BASOS PCT: 1 %
Basophils Absolute: 0.1 10*3/uL (ref 0.0–0.1)
Eosinophils Absolute: 0.8 10*3/uL — ABNORMAL HIGH (ref 0.0–0.5)
Eosinophils Relative: 9 %
HCT: 32 % — ABNORMAL LOW (ref 39.0–52.0)
Hemoglobin: 10.6 g/dL — ABNORMAL LOW (ref 13.0–17.0)
Immature Granulocytes: 1 %
Lymphocytes Relative: 27 %
Lymphs Abs: 2.3 10*3/uL (ref 0.7–4.0)
MCH: 32.2 pg (ref 26.0–34.0)
MCHC: 33.1 g/dL (ref 30.0–36.0)
MCV: 97.3 fL (ref 80.0–100.0)
MONOS PCT: 10 %
Monocytes Absolute: 0.8 10*3/uL (ref 0.1–1.0)
NEUTROS PCT: 52 %
Neutro Abs: 4.5 10*3/uL (ref 1.7–7.7)
Platelets: 184 10*3/uL (ref 150–400)
RBC: 3.29 MIL/uL — ABNORMAL LOW (ref 4.22–5.81)
RDW: 15 % (ref 11.5–15.5)
WBC: 8.5 10*3/uL (ref 4.0–10.5)
nRBC: 0 % (ref 0.0–0.2)

## 2018-06-12 MED ORDER — PIPERACILLIN-TAZOBACTAM 3.375 G IVPB 30 MIN
3.3750 g | Freq: Once | INTRAVENOUS | Status: AC
  Administered 2018-06-13: 3.375 g via INTRAVENOUS
  Filled 2018-06-12: qty 50

## 2018-06-12 NOTE — ED Provider Notes (Signed)
[facility] Emergency Department Provider Note    First MD Initiated Contact with Patient 06/12/18 2338     (approximate)  I have reviewed the triage vital signs and the nursing notes.   HISTORY  Chief Complaint Foot Swelling    HPI Jose [name] is a 73 y.o. male with extensive past medical history as described below on dialysis received dialysis today.  Presents the ER due to worsening right lower extremity rest pain as well as claudication now developing worsening wound to the third toe.  States he has been noticing a get worse over the past several days.  He did complete a course of antibiotics after being seen on Christmas.  States he is noticed is gotten much worse since then.  Does smoke occasionally.  States he has a history of irregular heartbeat.  Is unsure if he is on any anticoagulation or not.    Past Medical History:  Diagnosis Date  . Chronic kidney disease   . Depression   . Diabetes mellitus without complication ([place])   . Glaucoma   . Gout   . HLD (hyperlipidemia)   . Hypertension   . Prostate cancer ([place])   . Renal failure   . Stroke [facility])    No family history on file. Past Surgical History:  Procedure Laterality Date  . APPENDECTOMY    . AV FISTULA PLACEMENT Left 06/09/2018   Procedure: ARTERIOVENOUS (AV) FISTULA CREATION ( BRACHIAL CEPHALIC);  Surgeon: Algernon Huxley, MD;  Location: [facility];  Service: Vascular;  Laterality: Left;  . CHOLECYSTECTOMY    . DIALYSIS/PERMA CATHETER INSERTION N/A 03/24/2018   Procedure: DIALYSIS/PERMA CATHETER INSERTION;  Surgeon: Katha Cabal, MD;  Location: [facility];  Service: Cardiovascular;  Laterality: N/A;  . INSERTION PROSTATE RADIATION SEED     Patient Active Problem List   Diagnosis Date Noted  . Diabetes mellitus without complication ([place]) 28/76/8115  . Hypertension 04/16/2018  . ESRD on dialysis ([place]) 04/16/2018  . Malnutrition of moderate degree 03/27/2018  . Renal  failure 03/21/2018      Prior to Admission medications   Medication Sig Start Date End Date Taking? Authorizing Provider  allopurinol (ZYLOPRIM) 100 MG tablet Take 100 mg by mouth daily.     [provider]  amLODipine (NORVASC) 10 MG tablet Take 10 mg by mouth daily.     [provider]  aspirin EC 325 MG tablet Take 325 mg by mouth daily.     [provider]  cloNIDine (CATAPRES) 0.2 MG tablet TAKE 1 TABLET BY MOUTH TWICE A DAY 05/14/18   Algernon Huxley, MD  escitalopram (LEXAPRO) 10 MG tablet Take 10 mg by mouth daily.     [provider]  hydrALAZINE (APRESOLINE) 25 MG tablet Take 1 tablet (25 mg total) by mouth every 8 (eight) hours. 03/27/18   Loletha Grayer, MD  HYDROcodone-acetaminophen (NORCO) 5-325 MG tablet Take 1 tablet by mouth every 6 (six) hours as needed for moderate pain. 06/09/18   Algernon Huxley, MD  multivitamin (RENA-VIT) TABS tablet Take 1 tablet by mouth at bedtime. 03/27/18   Loletha Grayer, MD  Nutritional Supplements (FEEDING SUPPLEMENT, NEPRO CARB STEADY,) LIQD Take 237 mLs by mouth 2 (two) times daily between meals. 03/27/18   Loletha Grayer, MD    Allergies Penicillin g    Social History Social History   Tobacco Use  . Smoking status: Current Some Day Smoker    Packs/day: 0.10  Types: Cigarettes  . Smokeless tobacco: Never Used  Substance Use Topics  . Alcohol use: Not Currently    Alcohol/week: 0.0 standard drinks    Frequency: Never  . Drug use: Never    Review of Systems Patient denies headaches, rhinorrhea, blurry vision, numbness, shortness of breath, chest pain, edema, cough, abdominal pain, nausea, vomiting, diarrhea, dysuria, fevers, rashes or hallucinations unless otherwise stated above in HPI. ____________________________________________   PHYSICAL EXAM:  VITAL SIGNS: Vitals:   06/12/18 1640 06/12/18 2050  BP: (!) 160/81 (!) 177/71  Pulse: 86 77  Resp: 15 20  Temp:    SpO2: 98% 99%     Constitutional: Alert and oriented.  Eyes: Conjunctivae are normal.  Head: Atraumatic. Nose: No congestion/rhinnorhea. Mouth/Throat: Mucous membranes are moist.   Neck: No stridor. Painless ROM.  Cardiovascular: Normal rate, irregular rhythm. Grossly normal heart sounds.  Good peripheral circulation. Respiratory: Normal respiratory effort.  No retractions. Lungs CTAB. Gastrointestinal: Soft and nontender. No distention. No abdominal bruits. No CVA tenderness. Genitourinary:  Musculoskeletal: Right lower extremity with palpable popliteal as well as right femoral pulses.  The right third digit is dusky and gangrenous.  Cold to touch.  Does have PT and DP Doppler signals but unable to palpate them.  Has delayed cap refill to the toes.   Neurologic:  Normal speech and language. No gross focal neurologic deficits are appreciated. No facial droop Skin:  Skin is warm, dry and intact. No rash noted. Psychiatric: Mood and affect are normal. Speech and behavior are normal.  ____________________________________________   LABS (all labs ordered are listed, but only abnormal results are displayed)  Results for orders placed or performed during the hospital encounter of 06/12/18 (from the past 24 hour(s))  Comprehensive metabolic panel     Status: Abnormal   Collection Time: 06/12/18  4:41 PM  Result Value Ref Range   Sodium 139 135 - 145 mmol/L   Potassium 3.1 (L) 3.5 - 5.1 mmol/L   Chloride 98 98 - 111 mmol/L   CO2 30 22 - 32 mmol/L   Glucose, Bld 84 70 - 99 mg/dL   BUN 13 8 - 23 mg/dL   Creatinine, Ser 2.54 (H) 0.61 - 1.24 mg/dL   Calcium 8.4 (L) 8.9 - 10.3 mg/dL   Total Protein 7.6 6.5 - 8.1 g/dL   Albumin 3.7 3.5 - 5.0 g/dL   AST 25 15 - 41 U/L   ALT 15 0 - 44 U/L   Alkaline Phosphatase 40 38 - 126 U/L   Total Bilirubin 0.3 0.3 - 1.2 mg/dL   GFR calc non Af Amer 24 (L) >60 mL/min   GFR calc Af Amer 28 (L) >60 mL/min   Anion gap 11 5 - 15  CBC with Differential     Status:  Abnormal   Collection Time: 06/12/18  4:41 PM  Result Value Ref Range   WBC 8.5 4.0 - 10.5 K/uL   RBC 3.29 (L) 4.22 - 5.81 MIL/uL   Hemoglobin 10.6 (L) 13.0 - 17.0 g/dL   HCT 32.0 (L) 39.0 - 52.0 %   MCV 97.3 80.0 - 100.0 fL   MCH 32.2 26.0 - 34.0 pg   MCHC 33.1 30.0 - 36.0 g/dL   RDW 15.0 11.5 - 15.5 %   Platelets 184 150 - 400 K/uL   nRBC 0.0 0.0 - 0.2 %   Neutrophils Relative % 52 %   Neutro Abs 4.5 1.7 - 7.7 K/uL   Lymphocytes Relative 27 %  Lymphs Abs 2.3 0.7 - 4.0 K/uL   Monocytes Relative 10 %   Monocytes Absolute 0.8 0.1 - 1.0 K/uL   Eosinophils Relative 9 %   Eosinophils Absolute 0.8 (H) 0.0 - 0.5 K/uL   Basophils Relative 1 %   Basophils Absolute 0.1 0.0 - 0.1 K/uL   Immature Granulocytes 1 %   Abs Immature Granulocytes 0.04 0.00 - 0.07 K/uL  CG4 I-STAT (Lactic acid)     Status: None   Collection Time: 06/12/18  4:48 PM  Result Value Ref Range   Lactic Acid, Venous 1.04 0.5 - 1.9 mmol/L   ____________________________________________  EKG My review and personal interpretation at Time: 0:20   Indication: foot pain  Rate: 95  Rhythm: sinus Axis: normal  Other: normal intervals, occasional pvc ____________________________________________  RADIOLOGY  I personally reviewed all radiographic images ordered to evaluate for the above acute complaints and reviewed radiology reports and findings.  These findings were personally discussed with the patient.  Please see medical record for radiology report.  ____________________________________________   PROCEDURES  Procedure(s) performed:  Procedures    Critical Care performed:  ____________________________________________   INITIAL IMPRESSION / ASSESSMENT AND PLAN / ED COURSE  Pertinent labs & imaging results that were available during my care of the patient were reviewed by me and considered in my medical decision making (see chart for details).   DDX: pad, gangrene, osteo,   Jose [name] is a 73 y.o.  who presents to the ED with symptoms as described above.  Patient afebrile.  Hemodynamically stable and nontoxic-appearing but does have worsening gangrenous changes to the right third digit likely exacerbated by PAD as well as history of diabetes.  Patient failing outpatient management as he did receive a course of clindamycin is having worsening rest pain.  Will heparinize though he does have Doppler signals.  May be having more distal embolic phenomena.  EKG without any evidence of A. fib.  Will give Zosyn.  Will discuss with hospitalist for admission.      As part of my medical decision making, I reviewed the following data within the [place] notes reviewed and incorporated, Labs reviewed, notes from prior ED visits and [place] Controlled Substance Database   ____________________________________________   FINAL CLINICAL IMPRESSION(S) / ED DIAGNOSES  Final diagnoses:  Diabetic foot infection ([place])      NEW MEDICATIONS STARTED DURING THIS VISIT:  New Prescriptions   No medications on file     Note:  This document was prepared using Dragon voice recognition software and may include unintentional dictation errors.    Merlyn Lot, MD 06/13/18 [phone]

## 2018-06-12 NOTE — ED Triage Notes (Signed)
C/O bilateral foot swelling.  States has been seen through ED for same and diagnosed with an infection.  Sent back to ED for evaluation by Dialysis center.

## 2018-06-13 ENCOUNTER — Other Ambulatory Visit: Payer: Self-pay

## 2018-06-13 ENCOUNTER — Other Ambulatory Visit (INDEPENDENT_AMBULATORY_CARE_PROVIDER_SITE_OTHER): Payer: Self-pay | Admitting: Nurse Practitioner

## 2018-06-13 DIAGNOSIS — L97519 Non-pressure chronic ulcer of other part of right foot with unspecified severity: Secondary | ICD-10-CM | POA: Diagnosis present

## 2018-06-13 DIAGNOSIS — H409 Unspecified glaucoma: Secondary | ICD-10-CM | POA: Diagnosis present

## 2018-06-13 DIAGNOSIS — M109 Gout, unspecified: Secondary | ICD-10-CM | POA: Diagnosis present

## 2018-06-13 DIAGNOSIS — M7989 Other specified soft tissue disorders: Secondary | ICD-10-CM | POA: Diagnosis present

## 2018-06-13 DIAGNOSIS — L089 Local infection of the skin and subcutaneous tissue, unspecified: Secondary | ICD-10-CM | POA: Diagnosis present

## 2018-06-13 DIAGNOSIS — I120 Hypertensive chronic kidney disease with stage 5 chronic kidney disease or end stage renal disease: Secondary | ICD-10-CM | POA: Diagnosis present

## 2018-06-13 DIAGNOSIS — F329 Major depressive disorder, single episode, unspecified: Secondary | ICD-10-CM | POA: Diagnosis present

## 2018-06-13 DIAGNOSIS — I7092 Chronic total occlusion of artery of the extremities: Secondary | ICD-10-CM | POA: Diagnosis not present

## 2018-06-13 DIAGNOSIS — E1152 Type 2 diabetes mellitus with diabetic peripheral angiopathy with gangrene: Secondary | ICD-10-CM | POA: Diagnosis present

## 2018-06-13 DIAGNOSIS — N2581 Secondary hyperparathyroidism of renal origin: Secondary | ICD-10-CM | POA: Diagnosis present

## 2018-06-13 DIAGNOSIS — I70203 Unspecified atherosclerosis of native arteries of extremities, bilateral legs: Secondary | ICD-10-CM | POA: Diagnosis present

## 2018-06-13 DIAGNOSIS — E11621 Type 2 diabetes mellitus with foot ulcer: Secondary | ICD-10-CM | POA: Diagnosis present

## 2018-06-13 DIAGNOSIS — I77819 Aortic ectasia, unspecified site: Secondary | ICD-10-CM | POA: Diagnosis present

## 2018-06-13 DIAGNOSIS — Z992 Dependence on renal dialysis: Secondary | ICD-10-CM | POA: Diagnosis not present

## 2018-06-13 DIAGNOSIS — E785 Hyperlipidemia, unspecified: Secondary | ICD-10-CM | POA: Diagnosis present

## 2018-06-13 DIAGNOSIS — E1122 Type 2 diabetes mellitus with diabetic chronic kidney disease: Secondary | ICD-10-CM | POA: Diagnosis present

## 2018-06-13 DIAGNOSIS — F17210 Nicotine dependence, cigarettes, uncomplicated: Secondary | ICD-10-CM | POA: Diagnosis present

## 2018-06-13 DIAGNOSIS — I480 Paroxysmal atrial fibrillation: Secondary | ICD-10-CM | POA: Diagnosis present

## 2018-06-13 DIAGNOSIS — D631 Anemia in chronic kidney disease: Secondary | ICD-10-CM | POA: Diagnosis present

## 2018-06-13 DIAGNOSIS — I493 Ventricular premature depolarization: Secondary | ICD-10-CM | POA: Diagnosis present

## 2018-06-13 DIAGNOSIS — Z8546 Personal history of malignant neoplasm of prostate: Secondary | ICD-10-CM | POA: Diagnosis not present

## 2018-06-13 DIAGNOSIS — N186 End stage renal disease: Secondary | ICD-10-CM | POA: Diagnosis present

## 2018-06-13 DIAGNOSIS — M79602 Pain in left arm: Secondary | ICD-10-CM | POA: Diagnosis not present

## 2018-06-13 DIAGNOSIS — I96 Gangrene, not elsewhere classified: Secondary | ICD-10-CM | POA: Diagnosis present

## 2018-06-13 DIAGNOSIS — I70209 Unspecified atherosclerosis of native arteries of extremities, unspecified extremity: Secondary | ICD-10-CM | POA: Diagnosis present

## 2018-06-13 DIAGNOSIS — E11628 Type 2 diabetes mellitus with other skin complications: Secondary | ICD-10-CM | POA: Diagnosis present

## 2018-06-13 DIAGNOSIS — I70261 Atherosclerosis of native arteries of extremities with gangrene, right leg: Secondary | ICD-10-CM | POA: Diagnosis not present

## 2018-06-13 LAB — GLUCOSE, CAPILLARY
GLUCOSE-CAPILLARY: 76 mg/dL (ref 70–99)
Glucose-Capillary: 85 mg/dL (ref 70–99)
Glucose-Capillary: 90 mg/dL (ref 70–99)
Glucose-Capillary: 109 mg/dL — ABNORMAL HIGH (ref 70–99)

## 2018-06-13 LAB — CBC
HEMATOCRIT: 29.7 % — AB (ref 39.0–52.0)
Hemoglobin: 9.5 g/dL — ABNORMAL LOW (ref 13.0–17.0)
MCH: 30.9 pg (ref 26.0–34.0)
MCHC: 32 g/dL (ref 30.0–36.0)
MCV: 96.7 fL (ref 80.0–100.0)
Platelets: 165 10*3/uL (ref 150–400)
RBC: 3.07 MIL/uL — ABNORMAL LOW (ref 4.22–5.81)
RDW: 15 % (ref 11.5–15.5)
WBC: 6.9 10*3/uL (ref 4.0–10.5)
nRBC: 0 % (ref 0.0–0.2)

## 2018-06-13 LAB — URINALYSIS, COMPLETE (UACMP) WITH MICROSCOPIC
Bacteria, UA: NONE SEEN
Bilirubin Urine: NEGATIVE
Glucose, UA: NEGATIVE mg/dL
Hgb urine dipstick: NEGATIVE
KETONES UR: NEGATIVE mg/dL
Nitrite: NEGATIVE
PH: 6 (ref 5.0–8.0)
Protein, ur: 100 mg/dL — AB
Specific Gravity, Urine: 1.017 (ref 1.005–1.030)

## 2018-06-13 LAB — BASIC METABOLIC PANEL
Anion gap: 10 (ref 5–15)
BUN: 25 mg/dL — ABNORMAL HIGH (ref 8–23)
CHLORIDE: 100 mmol/L (ref 98–111)
CO2: 31 mmol/L (ref 22–32)
Calcium: 8.7 mg/dL — ABNORMAL LOW (ref 8.9–10.3)
Creatinine, Ser: 3.93 mg/dL — ABNORMAL HIGH (ref 0.61–1.24)
GFR calc Af Amer: 17 mL/min — ABNORMAL LOW (ref >60)
GFR calc non Af Amer: 14 mL/min — ABNORMAL LOW (ref >60)
Glucose, Bld: 143 mg/dL — ABNORMAL HIGH (ref 70–99)
Potassium: 3.2 mmol/L — ABNORMAL LOW (ref 3.5–5.1)
Sodium: 141 mmol/L (ref 135–145)

## 2018-06-13 LAB — MRSA PCR SCREENING: MRSA by PCR: NEGATIVE

## 2018-06-13 LAB — PROTIME-INR
INR: 1.15
Prothrombin Time: 14.6 seconds (ref 11.4–15.2)

## 2018-06-13 LAB — APTT: aPTT: 30 seconds (ref 24–36)

## 2018-06-13 LAB — HEPARIN LEVEL (UNFRACTIONATED)
Heparin Unfractionated: 0.36 IU/mL (ref 0.30–0.70)
Heparin Unfractionated: 0.43 IU/mL (ref 0.30–0.70)

## 2018-06-13 MED ORDER — HEPARIN BOLUS VIA INFUSION
4400.0000 [IU] | Freq: Once | INTRAVENOUS | Status: AC
  Administered 2018-06-13: 4400 [IU] via INTRAVENOUS
  Filled 2018-06-13: qty 4400

## 2018-06-13 MED ORDER — INSULIN ASPART 100 UNIT/ML ~~LOC~~ SOLN
0.0000 [IU] | Freq: Three times a day (TID) | SUBCUTANEOUS | Status: DC
  Administered 2018-06-15 – 2018-06-16 (×2): 1 [IU] via SUBCUTANEOUS
  Filled 2018-06-13 (×2): qty 1

## 2018-06-13 MED ORDER — PIPERACILLIN-TAZOBACTAM 3.375 G IVPB
3.3750 g | Freq: Three times a day (TID) | INTRAVENOUS | Status: DC
  Administered 2018-06-13: 3.375 g via INTRAVENOUS
  Filled 2018-06-13: qty 50

## 2018-06-13 MED ORDER — HEPARIN (PORCINE) 25000 UT/250ML-% IV SOLN
1100.0000 [IU]/h | INTRAVENOUS | Status: DC
  Administered 2018-06-13 – 2018-06-16 (×5): 1100 [IU]/h via INTRAVENOUS
  Filled 2018-06-13 (×5): qty 250

## 2018-06-13 MED ORDER — ASPIRIN EC 325 MG PO TBEC
325.0000 mg | DELAYED_RELEASE_TABLET | Freq: Every day | ORAL | Status: DC
  Administered 2018-06-13 – 2018-06-16 (×3): 325 mg via ORAL
  Filled 2018-06-13 (×3): qty 1

## 2018-06-13 MED ORDER — PIPERACILLIN-TAZOBACTAM 3.375 G IVPB
3.3750 g | Freq: Two times a day (BID) | INTRAVENOUS | Status: DC
  Administered 2018-06-13 – 2018-06-16 (×7): 3.375 g via INTRAVENOUS
  Filled 2018-06-13 (×8): qty 50

## 2018-06-13 MED ORDER — OXYCODONE HCL 5 MG PO TABS
5.0000 mg | ORAL_TABLET | ORAL | Status: DC | PRN
  Administered 2018-06-13 – 2018-06-16 (×6): 5 mg via ORAL
  Filled 2018-06-13 (×6): qty 1

## 2018-06-13 MED ORDER — ONDANSETRON HCL 4 MG/2ML IJ SOLN: 4.0000 mg | Freq: Four times a day (QID) | INTRAMUSCULAR | Status: DC | PRN

## 2018-06-13 MED ORDER — ACETAMINOPHEN 325 MG PO TABS: 650.0000 mg | ORAL_TABLET | Freq: Four times a day (QID) | ORAL | Status: DC | PRN

## 2018-06-13 MED ORDER — ONDANSETRON HCL 4 MG PO TABS: 4.0000 mg | ORAL_TABLET | Freq: Four times a day (QID) | ORAL | Status: DC | PRN

## 2018-06-13 MED ORDER — CLONIDINE HCL 0.1 MG PO TABS
0.2000 mg | ORAL_TABLET | Freq: Two times a day (BID) | ORAL | Status: DC
  Administered 2018-06-13 – 2018-06-16 (×6): 0.2 mg via ORAL
  Filled 2018-06-13 (×7): qty 2

## 2018-06-13 MED ORDER — AMLODIPINE BESYLATE 10 MG PO TABS
10.0000 mg | ORAL_TABLET | Freq: Every day | ORAL | Status: DC
  Administered 2018-06-13 – 2018-06-17 (×5): 10 mg via ORAL
  Filled 2018-06-13: qty 1
  Filled 2018-06-13 (×2): qty 2
  Filled 2018-06-13: qty 1
  Filled 2018-06-13: qty 2

## 2018-06-13 MED ORDER — ESCITALOPRAM OXALATE 10 MG PO TABS
10.0000 mg | ORAL_TABLET | Freq: Every day | ORAL | Status: DC
  Administered 2018-06-13 – 2018-06-16 (×4): 10 mg via ORAL
  Filled 2018-06-13 (×5): qty 1

## 2018-06-13 MED ORDER — MORPHINE SULFATE (PF) 2 MG/ML IV SOLN
2.0000 mg | INTRAVENOUS | Status: DC | PRN
  Administered 2018-06-13: 2 mg via INTRAVENOUS
  Filled 2018-06-13: qty 1

## 2018-06-13 MED ORDER — ACETAMINOPHEN 650 MG RE SUPP: 650.0000 mg | Freq: Four times a day (QID) | RECTAL | Status: DC | PRN

## 2018-06-13 MED ORDER — HYDRALAZINE HCL 25 MG PO TABS
25.0000 mg | ORAL_TABLET | Freq: Three times a day (TID) | ORAL | Status: DC
  Administered 2018-06-13 – 2018-06-17 (×9): 25 mg via ORAL
  Filled 2018-06-13 (×10): qty 1

## 2018-06-13 MED ORDER — POTASSIUM CHLORIDE CRYS ER 20 MEQ PO TBCR
40.0000 meq | EXTENDED_RELEASE_TABLET | Freq: Once | ORAL | Status: AC
  Administered 2018-06-13: 40 meq via ORAL
  Filled 2018-06-13: qty 2

## 2018-06-13 NOTE — Consult Note (Signed)
[facility] Consult Note  MRN : [id number]  Jose [name] is a 73 y.o. 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 Chief Complaint  Patient presents with  . Foot Swelling  .  History of Present Illness: 73 yo male with DM and ESRD on dialysis s/p left AVF creation last week returns to ED for right 3rd toe pain/ulceration.  Patient is scheduled for angiogram with Dr. Lucky Cowboy on 15Jan; however at dialysis yesterday it was recommended that he return to the ED asap for the toe ulceration.  He admits to pain in the 2nd, 3rd, and 4th toes but denies pain in the 1st and 5th toes of the right foot.  He denies pain with ambulation or at rest.  He denies foot pain, fevers, chills.  He has some postop pain in his left arm but no numbness/tingling or drainage from the wound.   Current Facility-Administered Medications  Medication Dose Route Frequency Provider Last Rate Last Dose  . acetaminophen (TYLENOL) tablet 650 mg  650 mg Oral Q6H PRN Lance Coon, MD       Or  . acetaminophen (TYLENOL) suppository 650 mg  650 mg Rectal Q6H PRN Lance Coon, MD      . amLODipine (NORVASC) tablet 10 mg  10 mg Oral Daily Lance Coon, MD   10 mg at 06/13/18 0810  . aspirin EC tablet 325 mg  325 mg Oral Daily Lance Coon, MD   325 mg at 06/13/18 0810  . cloNIDine (CATAPRES) tablet 0.2 mg  0.2 mg Oral BID Lance Coon, MD   0.2 mg at 06/13/18 0810  . escitalopram (LEXAPRO) tablet 10 mg  10 mg Oral Daily Lance Coon, MD   10 mg at 06/13/18 0809  . heparin ADULT infusion 100 units/mL (25000 units/274mL sodium chloride 0.45%)  1,100 Units/hr Intravenous Continuous Lance Coon, MD 11 mL/hr at 06/13/18 0551 1,100 Units/hr at 06/13/18 0551  . hydrALAZINE (APRESOLINE) tablet 25 mg  25 mg Oral Driscilla Moats, MD   25 mg at 06/13/18 0526  . insulin aspart (novoLOG) injection 0-9 Units  0-9 Units Subcutaneous TID WC Lance Coon, MD      . morphine  2 MG/ML injection 2 mg  2 mg Intravenous Q4H PRN Lance Coon, MD   2 mg at 06/13/18 0210  . ondansetron (ZOFRAN) tablet 4 mg  4 mg Oral Q6H PRN Lance Coon, MD       Or  . ondansetron [facility]) injection 4 mg  4 mg Intravenous Q6H PRN Lance Coon, MD      . oxyCODONE (Oxy IR/ROXICODONE) immediate release tablet 5 mg  5 mg Oral Q4H PRN Lance Coon, MD      . piperacillin-tazobactam (ZOSYN) IVPB 3.375 g  3.375 g Intravenous Q12H Nicholes Mango, MD        Past Medical History:  Diagnosis Date  . Chronic kidney disease   . Depression   . Diabetes mellitus without complication ([place])   . Glaucoma   . Gout   . HLD (hyperlipidemia)   . Hypertension   . Prostate cancer ([place])   . Renal failure   . Stroke [facility])     Past Surgical History:  Procedure Laterality Date  . APPENDECTOMY    . AV FISTULA PLACEMENT Left 06/09/2018   Procedure: ARTERIOVENOUS (AV) FISTULA CREATION ( BRACHIAL CEPHALIC);  Surgeon: Algernon Huxley, MD;  Location: [facility] ORS;  Service: Vascular;  Laterality: Left;  . CHOLECYSTECTOMY    .  DIALYSIS/PERMA CATHETER INSERTION N/A 03/24/2018   Procedure: DIALYSIS/PERMA CATHETER INSERTION;  Surgeon: Katha Cabal, MD;  Location: [facility];  Service: Cardiovascular;  Laterality: N/A;  . INSERTION PROSTATE RADIATION SEED      Social History Social History   Tobacco Use  . Smoking status: Current Some Day Smoker    Packs/day: 0.10    Types: Cigarettes  . Smokeless tobacco: Never Used  Substance Use Topics  . Alcohol use: Not Currently    Alcohol/week: 0.0 standard drinks    Frequency: Never  . Drug use: Never    Family History No family history on file.  Allergies  Allergen Reactions  . Penicillin G Other (See Comments)    Other reaction(s): Unknown DID THE REACTION INVOLVE: Swelling of the face/tongue/throat, SOB, or low BP? Unknown Sudden or severe rash/hives, skin peeling, or the inside of the mouth or nose? Unknown Did it require medical  treatment? Unknown When did it last happen?unknown If all above answers are "NO", may proceed with cephalosporin use.      REVIEW OF SYSTEMS (Negative unless checked)  Constitutional: [] Weight loss  [] Fever  [] Chills Cardiac: [] Chest pain   [] Chest pressure   [] Palpitations   [] Shortness of breath when laying flat   [] Shortness of breath at rest   [] Shortness of breath with exertion. Vascular:  [] Pain in legs with walking   [] Pain in legs at rest   [] Pain in legs when laying flat   [] Claudication   [] Pain in feet when walking  [] Pain in feet at rest  [] Pain in feet when laying flat   [] History of DVT   [] Phlebitis   [] Swelling in legs   [] Varicose veins   [] Non-healing ulcers Pulmonary:   [] Uses home oxygen   [] Productive cough   [] Hemoptysis   [] Wheeze  [] COPD   [] Asthma Neurologic:  [] Dizziness  [] Blackouts   [] Seizures   [] History of stroke   [] History of TIA  [] Aphasia   [] Temporary blindness   [] Dysphagia   [] Weakness or numbness in arms   [] Weakness or numbness in legs Musculoskeletal:  [] Arthritis   [] Joint swelling   [] Joint pain   [] Low back pain Hematologic:  [] Easy bruising  [] Easy bleeding   [] Hypercoagulable state   [] Anemic  [] Hepatitis Gastrointestinal:  [] Blood in stool   [] Vomiting blood  [] Gastroesophageal reflux/heartburn   [] Difficulty swallowing. Genitourinary:  [] Chronic kidney disease   [] Difficult urination  [] Frequent urination  [] Burning with urination   [] Blood in urine Skin:  [] Rashes   [x] Ulcers   [x] Wounds Psychological:  [] History of anxiety   []  History of major depression.  Physical Examination  Vitals:   06/12/18 2050 06/13/18 0100 06/13/18 0147 06/13/18 0535  BP: (!) 177/71 (!) 152/73 (!) 172/71 (!) 135/51  Pulse: 77 72 66 77  Resp: 20 20 18 18   Temp:   98.2 F (36.8 C) 98.5 F (36.9 C)  TempSrc:   Oral Oral  SpO2: 99% 97% 100% 98%  Weight:      Height:       Body mass index is 21.16 kg/m. Gen:  WD/WN, NAD Head: [place]/AT, No temporalis wasting.  Prominent temp pulse not noted. Ear/Nose/Throat: Hearing grossly intact, nares w/o erythema or drainage, oropharynx w/o Erythema/Exudate Eyes: Sclera non-icteric, conjunctiva clear Neck: Trachea midline.  No JVD.  Pulmonary:  Good air movement, respirations not labored, equal bilaterally.  Cardiac: RRR, normal S1, S2. Vascular: Left AVF +thrill/bruit, Right 3rd toe ischemic with distal ulceration, right 2nd/3rd/4th toes tender to palpation,  no drainage; bilateral feet warm Vessel Right Left  Radial Palpable Palpable  Femoral Palpable Palpable  PT NonPalpable NonPalpable  DP NonPalpable NonPalpable   Gastrointestinal: soft, non-tender/non-distended. No guarding/reflex.  Musculoskeletal: M/S 5/5 throughout.  Extremities without ischemic changes.  No deformity or atrophy. No edema. Neurologic: Sensation grossly intact in extremities.  Symmetrical.  Speech is fluent. Motor exam as listed above. Psychiatric: Judgment intact, Mood & affect appropriate for pt's clinical situation. Dermatologic: No rashes or ulcers noted.  No cellulitis or open wounds. Lymph : No Cervical, Axillary, or Inguinal lymphadenopathy.      CBC Lab Results  Component Value Date   WBC 6.9 06/13/2018   HGB 9.5 (L) 06/13/2018   HCT 29.7 (L) 06/13/2018   MCV 96.7 06/13/2018   PLT 165 06/13/2018    BMET    Component Value Date/Time   NA 141 06/13/2018 0501   NA 141 08/18/2011 1054   K 3.2 (L) 06/13/2018 0501   K 4.5 08/18/2011 1054   CL 100 06/13/2018 0501   CL 108 (H) 08/18/2011 1054   CO2 31 06/13/2018 0501   CO2 23 08/18/2011 1054   GLUCOSE 143 (H) 06/13/2018 0501   GLUCOSE 119 (H) 08/18/2011 1054   BUN 25 (H) 06/13/2018 0501   BUN 25 (H) 08/18/2011 1054   CREATININE 3.93 (H) 06/13/2018 0501   CREATININE 2.10 (H) 08/18/2011 1054   CALCIUM 8.7 (L) 06/13/2018 0501   CALCIUM 9.0 08/18/2011 1054   GFRNONAA 14 (L) 06/13/2018 0501   GFRNONAA 34 (L) 08/18/2011 1054   GFRAA 17 (L) 06/13/2018 0501    GFRAA 41 (L) 08/18/2011 1054   Estimated Creatinine Clearance: 17.5 mL/min (A) (by C-G formula based on SCr of 3.93 mg/dL (H)).  COAG Lab Results  Component Value Date   INR 1.15 06/13/2018   INR 1.02 05/28/2018    Radiology Dg Chest 2 View  Result Date: 06/12/2018 CLINICAL DATA:  Right arm swelling after central line placement. EXAM: CHEST - 2 VIEW COMPARISON:  03/21/2018 FINDINGS: Right jugular dual-lumen central venous dialysis catheter is seen in appropriate position. No evidence of pneumothorax or pleural effusion. Heart size is at the upper limits of normal. Tortuosity of thoracic aorta is stable. Both lungs are well aerated and clear. IMPRESSION: Right jugular dual-lumen central venous dialysis catheter in appropriate position. No evidence of pneumothorax. No active lung disease. Electronically Signed   By: Earle Gell M.D.   On: 06/12/2018 17:42   Dg Foot Complete Right  Result Date: 06/13/2018 CLINICAL DATA:  Infection to the right third toe. Diabetes. EXAM: RIGHT FOOT COMPLETE - 3+ VIEW COMPARISON:  05/26/2018 FINDINGS: Diffuse bone demineralization. Mild hallux valgus deformity. No evidence of acute fracture or dislocation. No focal bone lesion or bone destruction. Soft tissue gas underneath the nail bed of the third toe suggesting soft tissue infection. No underlying bone changes to suggest osteomyelitis. IMPRESSION: No acute bony abnormalities. Soft tissue gas under the third toenail suggesting soft tissue infection. No changes to suggest osteomyelitis. Electronically Signed   By: Lucienne Capers M.D.   On: 06/13/2018 00:15   Dg Foot Complete Right  Result Date: 05/26/2018 CLINICAL DATA:  Pain in right first and second toes for 1 week. No injury. History of diabetes. EXAM: RIGHT FOOT COMPLETE - 3+ VIEW COMPARISON:  None. FINDINGS: Osteopenia. Mild hallux valgus deformity with secondary osteoarthritis of the first metatarsophalangeal joint. No typical findings of gouty arthritis.  Suspect mild soft tissue swelling about the forefoot. IMPRESSION: Hallux valgus deformity  with secondary osteoarthritis of the first metatarsophalangeal joint. No acute osseous abnormality. Electronically Signed   By: Abigail Miyamoto M.D.   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  No e/o osteomyelitis on XR.  Scheduled for angio 15 Jan.    1. NPO after MN for possible angio sooner than 15Jan 2. Continue heparin gtt 3. Continue IV abx    Murray Hodgkins, MD  06/13/2018 11:56 AM

## 2018-06-13 NOTE — Progress Notes (Addendum)
Pharmacy Antibiotic Note  Jose [name] is a 73 y.o. male admitted on 06/12/2018 with right toe gangrene.  Pharmacy has been consulted for Zosyn dosing.  Plan: Zosyn 3.375g IV q12h (4 hour infusion).  Height: 6\' 1"  (185.4 cm) Weight: 160 lb 6.4 oz (72.8 kg) IBW/kg (Calculated) : 79.9  Temp (24hrs), Avg:98.2 F (36.8 C), Min:98.1 F (36.7 C), Max:98.2 F (36.8 C)  Recent Labs  Lab 06/12/18 1641 06/12/18 1648  WBC 8.5  --   CREATININE 2.54*  --   LATICACIDVEN  --  1.04    Estimated Creatinine Clearance: 27.1 mL/min (A) (by C-G formula based on SCr of 2.54 mg/dL (H)).    Allergies  Allergen Reactions  . Penicillin G Other (See Comments)    Other reaction(s): Unknown DID THE REACTION INVOLVE: Swelling of the face/tongue/throat, SOB, or low BP? Unknown Sudden or severe rash/hives, skin peeling, or the inside of the mouth or nose? Unknown Did it require medical treatment? Unknown When did it last happen?unknown If all above answers are "NO", may proceed with cephalosporin use.     Antimicrobials this admission: Zosyn 1/12  >>    >>   Dose adjustments this admission:   Microbiology results: 1/12 BCx: pending   Thank you for allowing pharmacy to be a part of this patient's care.  Laural Benes 06/13/2018 2:53 AM

## 2018-06-13 NOTE — Progress Notes (Signed)
[place], [place] 06/13/18  Subjective:   Patient known to our practice from outpatient dialysis.  Presents for right foot pain, feet swelling Patient recently underwent AV fistula creation Admitted for further evaluation   Objective:  Vital signs in last 24 hours:  Temp:  [98.1 F (36.7 C)-98.5 F (36.9 C)] 98.5 F (36.9 C) (01/12 0535) Pulse Rate:  [66-86] 77 (01/12 0535) Resp:  [15-20] 18 (01/12 0535) BP: (135-177)/(51-81) 135/51 (01/12 0535) SpO2:  [97 %-100 %] 98 % (01/12 0535) Weight:  [72.8 kg] 72.8 kg (01/11 1639)  Weight change:  Filed Weights   06/12/18 1639  Weight: 72.8 kg    Intake/Output:    Intake/Output Summary (Last 24 hours) at 06/13/2018 1103 Last data filed at 06/13/2018 0551 Gross per 24 hour  Intake 40.53 ml  Output 50 ml  Net -9.47 ml     Physical Exam: General:  No acute distress, laying in the bed  HEENT  moist ora his membranes l  Neck  supple, no masses  Pulm/lungs  normal breathing effort on room air, mild scattered rhonchi  CVS/Heart  irregular rhythm  Abdomen:   Soft, nontender  Extremities:  No edema, right third toe gangrenous changes  Neurologic:  Alert, able to answer questions appropriately  Skin:  No acute rashes  Access:  IJ PermCath, left upper extremity AV fistula developing       Basic Metabolic Panel:  Recent Labs  Lab 06/08/18 1130 06/09/18 1041 06/12/18 1641 06/13/18 0501  NA  --  142 139 141  K 3.9 3.4* 3.1* 3.2*  CL  --   --  98 100  CO2  --   --  30 31  GLUCOSE  --  91 84 143*  BUN  --   --  13 25*  CREATININE  --   --  2.54* 3.93*  CALCIUM  --   --  8.4* 8.7*     CBC: Recent Labs  Lab 06/09/18 1041 06/12/18 1641 06/13/18 0501  WBC  --  8.5 6.9  NEUTROABS  --  4.5  --   HGB 10.9* 10.6* 9.5*  HCT 32.0* 32.0* 29.7*  MCV  --  97.3 96.7  PLT  --  184 165      Lab Results  Component Value Date   HEPBSAG Negative 03/22/2018   HEPBSAB Reactive 03/22/2018    HEPBIGM Negative 03/22/2018      Microbiology:  Recent Results (from the past 240 hour(s))  Blood Culture (routine x 2)     Status: None (Preliminary result)   Collection Time: 06/13/18 12:32 AM  Result Value Ref Range Status   Specimen Description BLOOD RIGHT FOREARM  Final   Special Requests   Final    BOTTLES DRAWN AEROBIC ONLY Blood Culture adequate volume   Culture   Final    NO GROWTH < 12 HOURS Performed at [facility], [address] [place], [place] [address]    Report Status PENDING  Incomplete  Blood Culture (routine x 2)     Status: None (Preliminary result)   Collection Time: 06/13/18 12:32 AM  Result Value Ref Range Status   Specimen Description BLOOD RIGHT FOREARM UPPER  Final   Special Requests   Final    BOTTLES DRAWN AEROBIC AND ANAEROBIC Blood Culture results may not be optimal due to an excessive volume of blood received in culture bottles   Culture   Final    NO GROWTH < 12 HOURS Performed  at [facility], [address] [place], [place] [address]    Report Status PENDING  Incomplete  MRSA PCR Screening     Status: None   Collection Time: 06/13/18  7:51 AM  Result Value Ref Range Status   MRSA by PCR NEGATIVE NEGATIVE Final    Comment:        The GeneXpert MRSA Assay (FDA approved for NASAL specimens only), is one component of a comprehensive MRSA colonization surveillance program. It is not intended to diagnose MRSA infection nor to guide or monitor treatment for MRSA infections. Performed at [facility], [address] [place], [place] [address]     Coagulation Studies: Recent Labs    06/13/18 0032  LABPROT 14.6  INR 1.15    Urinalysis: No results for input(s): COLORURINE, LABSPEC, PHURINE, GLUCOSEU, HGBUR, BILIRUBINUR, KETONESUR, PROTEINUR, UROBILINOGEN, NITRITE, LEUKOCYTESUR in the last 72 hours.  Invalid input(s): APPERANCEUR    Imaging: Dg Chest 2 View  Result Date: 06/12/2018 CLINICAL DATA:   Right arm swelling after central line placement. EXAM: CHEST - 2 VIEW COMPARISON:  03/21/2018 FINDINGS: Right jugular dual-lumen central venous dialysis catheter is seen in appropriate position. No evidence of pneumothorax or pleural effusion. Heart size is at the upper limits of normal. Tortuosity of thoracic aorta is stable. Both lungs are well aerated and clear. IMPRESSION: Right jugular dual-lumen central venous dialysis catheter in appropriate position. No evidence of pneumothorax. No active lung disease. Electronically Signed   By: Earle Gell M.D.   On: 06/12/2018 17:42   Dg Foot Complete Right  Result Date: 06/13/2018 CLINICAL DATA:  Infection to the right third toe. Diabetes. EXAM: RIGHT FOOT COMPLETE - 3+ VIEW COMPARISON:  05/26/2018 FINDINGS: Diffuse bone demineralization. Mild hallux valgus deformity. No evidence of acute fracture or dislocation. No focal bone lesion or bone destruction. Soft tissue gas underneath the nail bed of the third toe suggesting soft tissue infection. No underlying bone changes to suggest osteomyelitis. IMPRESSION: No acute bony abnormalities. Soft tissue gas under the third toenail suggesting soft tissue infection. No changes to suggest osteomyelitis. Electronically Signed   By: Lucienne Capers M.D.   On: 06/13/2018 00:15     Medications:   . heparin 1,100 Units/hr (06/13/18 0551)  . piperacillin-tazobactam (ZOSYN)  IV     . amLODipine  10 mg Oral Daily  . aspirin EC  325 mg Oral Daily  . cloNIDine  0.2 mg Oral BID  . escitalopram  10 mg Oral Daily  . hydrALAZINE  25 mg Oral Q8H  . insulin aspart  0-9 Units Subcutaneous TID WC   acetaminophen **OR** acetaminophen, morphine injection, ondansetron **OR** ondansetron (ZOFRAN) IV, oxyCODONE  Assessment/ Plan:  73 y.o.African American male with esrd, hypertension, gout, depression, diabetes mellitus type II, history of prostate cancer, Atrial fibrillation who was admitted to [facility] for rt foot  gangrene  [address] [facility]/TTS/  End-stage renal disease -We will arrange for routine dialysis on Tuesday  Secondary hyperparathyroidism Monitor phosphorus during hospital admission  Anemia of chronic kidney disease -Hemoglobin 9.5 -Continue ESA with dialysis  Right foot third toe ulceration and pain -Angiogram by vascular surgery     LOS: 0 Jose [name] 1/12/202011:03 AM  [facility] [place], [place]  Note: This note was prepared with Dragon dictation. Any transcription errors are unintentional

## 2018-06-13 NOTE — H&P (Signed)
[place] [facility] [place] NAME: Jose [name]    MR#:  [id number]  DATE OF BIRTH:  Oct 17, 1945  DATE OF ADMISSION:  06/12/2018  PRIMARY CARE PHYSICIAN: [name], MD   REQUESTING/REFERRING PHYSICIAN: Quentin Cornwall, MD  CHIEF COMPLAINT:   Chief Complaint  Patient presents with  . Foot Swelling    HISTORY OF PRESENT ILLNESS:  Jose [name]  is a 73 y.o. male who presents with chief complaint as above.  Patient presents to the ED for evaluation of right toe ulceration and discoloration.  He was at dialysis and was recommended by dialysis nurse that he come to the ED for evaluation of the same.  He states that he first noticed this problem over a week ago.  He was arranging an appointment in the outpatient setting with Dr. Lucky Cowboy from vascular surgery, but came to the ED today as stated above.  Here in the ED he was started on IV antibiotics, IV heparin and hospitalist were called for admission  PAST MEDICAL HISTORY:   Past Medical History:  Diagnosis Date  . Chronic kidney disease   . Depression   . Diabetes mellitus without complication ([place])   . Glaucoma   . Gout   . HLD (hyperlipidemia)   . Hypertension   . Prostate cancer ([place])   . Renal failure   . Stroke [facility])      PAST SURGICAL HISTORY:   Past Surgical History:  Procedure Laterality Date  . APPENDECTOMY    . AV FISTULA PLACEMENT Left 06/09/2018   Procedure: ARTERIOVENOUS (AV) FISTULA CREATION ( BRACHIAL CEPHALIC);  Surgeon: Algernon Huxley, MD;  Location: [facility] ORS;  Service: Vascular;  Laterality: Left;  . CHOLECYSTECTOMY    . DIALYSIS/PERMA CATHETER INSERTION N/A 03/24/2018   Procedure: DIALYSIS/PERMA CATHETER INSERTION;  Surgeon: Katha Cabal, MD;  Location: [facility];  Service: Cardiovascular;  Laterality: N/A;  . INSERTION PROSTATE RADIATION SEED       SOCIAL HISTORY:   Social History   Tobacco Use  . Smoking status: Current Some Day Smoker     Packs/day: 0.10    Types: Cigarettes  . Smokeless tobacco: Never Used  Substance Use Topics  . Alcohol use: Not Currently    Alcohol/week: 0.0 standard drinks    Frequency: Never     FAMILY HISTORY:    Family history reviewed and is non-contributory DRUG ALLERGIES:   Allergies  Allergen Reactions  . Penicillin G     Other reaction(s): Unknown    MEDICATIONS AT HOME:   Prior to Admission medications   Medication Sig Start Date End Date Taking? Authorizing Provider  allopurinol (ZYLOPRIM) 100 MG tablet Take 100 mg by mouth daily.     [provider]  amLODipine (NORVASC) 10 MG tablet Take 10 mg by mouth daily.     [provider]  aspirin EC 325 MG tablet Take 325 mg by mouth daily.     [provider]  cloNIDine (CATAPRES) 0.2 MG tablet TAKE 1 TABLET BY MOUTH TWICE A DAY 05/14/18   Algernon Huxley, MD  escitalopram (LEXAPRO) 10 MG tablet Take 10 mg by mouth daily.     [provider]  hydrALAZINE (APRESOLINE) 25 MG tablet Take 1 tablet (25 mg total) by mouth every 8 (eight) hours. 03/27/18   Loletha Grayer, MD  HYDROcodone-acetaminophen (NORCO) 5-325 MG tablet Take 1 tablet by mouth every 6 (six) hours as needed for moderate pain. 06/09/18  Algernon Huxley, MD  multivitamin (RENA-VIT) TABS tablet Take 1 tablet by mouth at bedtime. 03/27/18   Loletha Grayer, MD  Nutritional Supplements (FEEDING SUPPLEMENT, NEPRO CARB STEADY,) LIQD Take 237 mLs by mouth 2 (two) times daily between meals. 03/27/18   Loletha Grayer, MD    REVIEW OF SYSTEMS:  Review of Systems  Constitutional: Negative for chills, fever, malaise/fatigue and weight loss.  HENT: Negative for ear pain, hearing loss and tinnitus.   Eyes: Negative for blurred vision, double vision, pain and redness.  Respiratory: Negative for cough, hemoptysis and shortness of breath.   Cardiovascular: Negative for chest pain, palpitations, orthopnea and leg swelling.  Gastrointestinal: Negative for  abdominal pain, constipation, diarrhea, nausea and vomiting.  Genitourinary: Negative for dysuria, frequency and hematuria.  Musculoskeletal: Negative for back pain, joint pain and neck pain.  Skin:       Right third toe ulceration and dark discoloration  Neurological: Negative for dizziness, tremors, focal weakness and weakness.  Endo/Heme/Allergies: Negative for polydipsia. Does not bruise/bleed easily.  Psychiatric/Behavioral: Negative for depression. The patient is not nervous/anxious and does not have insomnia.      VITAL SIGNS:   Vitals:   06/12/18 1638 06/12/18 1639 06/12/18 1640 06/12/18 2050  BP:   (!) 160/81 (!) 177/71  Pulse:   86 77  Resp:   15 20  Temp: 98.1 F (36.7 C)     TempSrc: Oral     SpO2:   98% 99%  Weight:  72.8 kg    Height:  6\' 1"  (1.854 m)     Wt Readings from Last 3 Encounters:  06/12/18 72.8 kg  05/28/18 72.8 kg  05/26/18 68 kg    PHYSICAL EXAMINATION:  Physical Exam  Vitals reviewed. Constitutional: He is oriented to person, place, and time. He appears well-developed and well-nourished. No distress.  HENT:  Head: Normocephalic and atraumatic.  Mouth/Throat: Oropharynx is clear and moist.  Eyes: Pupils are equal, round, and reactive to light. Conjunctivae and EOM are normal. No scleral icterus.  Neck: Normal range of motion. Neck supple. No JVD present. No thyromegaly present.  Cardiovascular: Normal rate and intact distal pulses. Exam reveals no gallop and no friction rub.  No murmur heard. Irregular rhythm  Respiratory: Effort normal and breath sounds normal. No respiratory distress. He has no wheezes. He has no rales.  GI: Soft. Bowel sounds are normal. He exhibits no distension. There is no abdominal tenderness.  Musculoskeletal: Normal range of motion.        General: No edema.     Comments: Discoloration of right third toe with distal digital ulceration and significant tenderness  Lymphadenopathy:    He has no cervical adenopathy.   Neurological: He is alert and oriented to person, place, and time. No cranial nerve deficit.  No dysarthria, no aphasia  Skin: Skin is warm and dry. No rash noted. No erythema.  Psychiatric: He has a normal mood and affect. His behavior is normal. Judgment and thought content normal.    LABORATORY PANEL:   CBC Recent Labs  Lab 06/12/18 1641  WBC 8.5  HGB 10.6*  HCT 32.0*  PLT 184   ------------------------------------------------------------------------------------------------------------------  Chemistries  Recent Labs  Lab 06/12/18 1641  NA 139  K 3.1*  CL 98  CO2 30  GLUCOSE 84  BUN 13  CREATININE 2.54*  CALCIUM 8.4*  AST 25  ALT 15  ALKPHOS 40  BILITOT 0.3   ------------------------------------------------------------------------------------------------------------------  Cardiac Enzymes No results for input(s): TROPONINI in  the last 168 hours. ------------------------------------------------------------------------------------------------------------------  RADIOLOGY:  Dg Chest 2 View  Result Date: 06/12/2018 CLINICAL DATA:  Right arm swelling after central line placement. EXAM: CHEST - 2 VIEW COMPARISON:  03/21/2018 FINDINGS: Right jugular dual-lumen central venous dialysis catheter is seen in appropriate position. No evidence of pneumothorax or pleural effusion. Heart size is at the upper limits of normal. Tortuosity of thoracic aorta is stable. Both lungs are well aerated and clear. IMPRESSION: Right jugular dual-lumen central venous dialysis catheter in appropriate position. No evidence of pneumothorax. No active lung disease. Electronically Signed   By: Earle Gell M.D.   On: 06/12/2018 17:42   Dg Foot Complete Right  Result Date: 06/13/2018 CLINICAL DATA:  Infection to the right third toe. Diabetes. EXAM: RIGHT FOOT COMPLETE - 3+ VIEW COMPARISON:  05/26/2018 FINDINGS: Diffuse bone demineralization. Mild hallux valgus deformity. No evidence of acute  fracture or dislocation. No focal bone lesion or bone destruction. Soft tissue gas underneath the nail bed of the third toe suggesting soft tissue infection. No underlying bone changes to suggest osteomyelitis. IMPRESSION: No acute bony abnormalities. Soft tissue gas under the third toenail suggesting soft tissue infection. No changes to suggest osteomyelitis. Electronically Signed   By: Lucienne Capers M.D.   On: 06/13/2018 00:15    EKG:   Orders placed or performed during the hospital encounter of 06/12/18  . ED EKG  . ED EKG    IMPRESSION AND PLAN:  Principal Problem:   Gangrene of toe of right foot ([place]) -patient placed on IV antibiotics, as well as heparin drip.  Patient has good pedal pulses in that foot, strong suspicion for embolic phenomenon in the setting of A. fib, vascular surgery consulted Active Problems:   Paroxysmal atrial fibrillation -Heparin drip as above, continue home dose rate controlling medication   Diabetes mellitus (HCC) -sliding scale insulin coverage   Hypertension -home dose antihypertensives   ESRD on dialysis [facility]) -nephrology consult for dialysis support  Chart review performed and case discussed with ED provider. Labs, imaging and/or ECG reviewed by provider and discussed with patient/family. Management plans discussed with the patient and/or family.  DVT PROPHYLAXIS: Systemic anticoagulation  GI PROPHYLAXIS:  None  ADMISSION STATUS: Inpatient     CODE STATUS: Full Code Status History    Date Active Date Inactive Code Status Order ID Comments User Context   03/21/2018 1315 03/28/2018 1004 Full Code [id number]  Saundra Shelling, MD ED      TOTAL TIME TAKING CARE OF THIS PATIENT: 45 minutes.   Jannifer Franklin, Mariana [place] 06/13/2018, 1:01 AM  [employer]  [phone]  CC: Primary care physician; [name], MD  Note:  This document was prepared using Dragon voice recognition software and may include unintentional  dictation errors.

## 2018-06-13 NOTE — Progress Notes (Signed)
ANTICOAGULATION CONSULT NOTE - Initial Consult  Pharmacy Consult for heparin drip Indication: PAD claudication rest pain  Allergies  Allergen Reactions  . Penicillin G     Other reaction(s): Unknown    Patient Measurements: Height: 6\' 1"  (185.4 cm) Weight: 160 lb 6.4 oz (72.8 kg) IBW/kg (Calculated) : 79.9 Heparin Dosing Weight: 73 kg  Vital Signs: Temp: 98.1 F (36.7 C) (01/11 1638) Temp Source: Oral (01/11 1638) BP: 177/71 (01/11 2050) Pulse Rate: 77 (01/11 2050)  Labs: Recent Labs    06/12/18 1641  HGB 10.6*  HCT 32.0*  PLT 184  CREATININE 2.54*    Estimated Creatinine Clearance: 27.1 mL/min (A) (by C-G formula based on SCr of 2.54 mg/dL (H)).   Medical History: Past Medical History:  Diagnosis Date  . Chronic kidney disease   . Depression   . Diabetes mellitus without complication ([place])   . Glaucoma   . Gout   . HLD (hyperlipidemia)   . Hypertension   . Prostate cancer ([place])   . Renal failure   . Stroke [facility])     Medications:  No anticoag in PTA meds  Assessment:  Goal of Therapy:  Heparin level 0.3-0.7 units/ml Monitor platelets by anticoagulation protocol: Yes   Plan:  4400 unit bolus and initial rate of 1100 units/hr. First heparin level 8 hours after start of infusion.  Pranit Owensby S 06/13/2018,12:21 AM

## 2018-06-13 NOTE — Progress Notes (Signed)
ANTICOAGULATION CONSULT NOTE - Initial Consult  Pharmacy Consult for heparin drip Indication: PAD claudication rest pain  Allergies  Allergen Reactions  . Penicillin G Other (See Comments)    Other reaction(s): Unknown DID THE REACTION INVOLVE: Swelling of the face/tongue/throat, SOB, or low BP? Unknown Sudden or severe rash/hives, skin peeling, or the inside of the mouth or nose? Unknown Did it require medical treatment? Unknown When did it last happen?unknown If all above answers are "NO", may proceed with cephalosporin use.     Patient Measurements: Height: 6\' 1"  (185.4 cm) Weight: 160 lb 6.4 oz (72.8 kg) IBW/kg (Calculated) : 79.9 Heparin Dosing Weight: 73 kg  Vital Signs: Temp: 98.3 F (36.8 C) (01/12 1305) Temp Source: Oral (01/12 1305) BP: 152/61 (01/12 1305) Pulse Rate: 68 (01/12 1305)  Labs: Recent Labs    06/12/18 1641 06/13/18 0032 06/13/18 0501 06/13/18 1009 06/13/18 1807  HGB 10.6*  --  9.5*  --   --   HCT 32.0*  --  29.7*  --   --   PLT 184  --  165  --   --   APTT  --  30  --   --   --   LABPROT  --  14.6  --   --   --   INR  --  1.15  --   --   --   HEPARINUNFRC  --   --   --  0.36 0.43  CREATININE 2.54*  --  3.93*  --   --     Estimated Creatinine Clearance: 17.5 mL/min (A) (by C-G formula based on SCr of 3.93 mg/dL (H)).   Medical History: Past Medical History:  Diagnosis Date  . Chronic kidney disease   . Depression   . Diabetes mellitus without complication ([place])   . Glaucoma   . Gout   . HLD (hyperlipidemia)   . Hypertension   . Prostate cancer ([place])   . Renal failure   . Stroke [facility])     Medications:  No anticoag in PTA meds  Assessment:  HEPARIN COURSE 4400 unit bolus and initial rate of 1100 units/hr. First heparin level 8 hours after start of infusion.  1/12 10:09 HL 036   1/12  1807 HL 0.43   Goal of Therapy:  Heparin level 0.3-0.7 units/ml Monitor platelets by anticoagulation protocol: Yes   Plan:  Heparin  level therapeutic. Continue at current rate. Will start monitor heparin once daily with AM labs.   Oswald Hillock, PharmD, BCPS Clinical Pharmacist  06/13/2018,7:03 PM

## 2018-06-13 NOTE — ED Notes (Signed)
Jose [name] (son) [phone]

## 2018-06-13 NOTE — Progress Notes (Signed)
[place] [facility] [place] NAME: Jose [name]    MR#:  [id number]  DATE OF BIRTH:  12/26/1945  SUBJECTIVE:  CHIEF COMPLAINT: Patient is resting okay.  Pain is manageable, does not hurt him that much  REVIEW OF SYSTEMS:  CONSTITUTIONAL: No fever, fatigue or weakness.  EYES: No blurred or double vision.  EARS, NOSE, AND THROAT: No tinnitus or ear pain.  RESPIRATORY: No cough, shortness of breath, wheezing or hemoptysis.  CARDIOVASCULAR: No chest pain, orthopnea, edema.  GASTROINTESTINAL: No nausea, vomiting, diarrhea or abdominal pain.  GENITOURINARY: No dysuria, hematuria.  ENDOCRINE: No polyuria, nocturia,  HEMATOLOGY: No anemia, easy bruising or bleeding SKIN: No rash or lesion. MUSCULOSKELETAL: Right third toe with discoloration and swelling NEUROLOGIC: No tingling, numbness, weakness.  PSYCHIATRY: No anxiety or depression.   DRUG ALLERGIES:   Allergies  Allergen Reactions  . Penicillin G Other (See Comments)    Other reaction(s): Unknown DID THE REACTION INVOLVE: Swelling of the face/tongue/throat, SOB, or low BP? Unknown Sudden or severe rash/hives, skin peeling, or the inside of the mouth or nose? Unknown Did it require medical treatment? Unknown When did it last happen?unknown If all above answers are "NO", may proceed with cephalosporin use.     VITALS:  Blood pressure (!) 152/61, pulse 68, temperature 98.3 F (36.8 C), temperature source Oral, resp. rate 13, height 6\' 1"  (1.854 m), weight 72.8 kg, SpO2 100 %.  PHYSICAL EXAMINATION:  GENERAL:  73 y.o.-year-old patient lying in the bed with no acute distress.  EYES: Pupils equal, round, reactive to light and accommodation. No scleral icterus. Extraocular muscles intact.  HEENT: Head atraumatic, normocephalic. Oropharynx and nasopharynx clear.  NECK:  Supple, no jugular venous distention. No thyroid enlargement, no tenderness.  LUNGS: Normal breath sounds bilaterally,  no wheezing, rales,rhonchi or crepitation. No use of accessory muscles of respiration.  CARDIOVASCULAR: S1, S2 normal. No murmurs, rubs, or gallops.  ABDOMEN: Soft, nontender, nondistended. Bowel sounds present. No organomegaly or mass.  EXTREMITIES: Right third toe ulceration with dark with dark discoloration and NEUROLOGIC: Awake, alert and oriented x3 sensation intact. Gait not checked.  PSYCHIATRIC: The patient is alert and oriented x 3.  SKIN: No obvious rash, lesion, or ulcer.    LABORATORY PANEL:   CBC Recent Labs  Lab 06/13/18 0501  WBC 6.9  HGB 9.5*  HCT 29.7*  PLT 165   ------------------------------------------------------------------------------------------------------------------  Chemistries  Recent Labs  Lab 06/12/18 1641 06/13/18 0501  NA 139 141  K 3.1* 3.2*  CL 98 100  CO2 30 31  GLUCOSE 84 143*  BUN 13 25*  CREATININE 2.54* 3.93*  CALCIUM 8.4* 8.7*  AST 25  --   ALT 15  --   ALKPHOS 40  --   BILITOT 0.3  --    ------------------------------------------------------------------------------------------------------------------  Cardiac Enzymes No results for input(s): TROPONINI in the last 168 hours. ------------------------------------------------------------------------------------------------------------------  RADIOLOGY:  Dg Chest 2 View  Result Date: 06/12/2018 CLINICAL DATA:  Right arm swelling after central line placement. EXAM: CHEST - 2 VIEW COMPARISON:  03/21/2018 FINDINGS: Right jugular dual-lumen central venous dialysis catheter is seen in appropriate position. No evidence of pneumothorax or pleural effusion. Heart size is at the upper limits of normal. Tortuosity of thoracic aorta is stable. Both lungs are well aerated and clear. IMPRESSION: Right jugular dual-lumen central venous dialysis catheter in appropriate position. No evidence of pneumothorax. No active lung disease. Electronically Signed   By: Sharrie Rothman.D.  On: 06/12/2018  17:42   Dg Foot Complete Right  Result Date: 06/13/2018 CLINICAL DATA:  Infection to the right third toe. Diabetes. EXAM: RIGHT FOOT COMPLETE - 3+ VIEW COMPARISON:  05/26/2018 FINDINGS: Diffuse bone demineralization. Mild hallux valgus deformity. No evidence of acute fracture or dislocation. No focal bone lesion or bone destruction. Soft tissue gas underneath the nail bed of the third toe suggesting soft tissue infection. No underlying bone changes to suggest osteomyelitis. IMPRESSION: No acute bony abnormalities. Soft tissue gas under the third toenail suggesting soft tissue infection. No changes to suggest osteomyelitis. Electronically Signed   By: Lucienne Capers M.D.   On: 06/13/2018 00:15    EKG:   Orders placed or performed during the hospital encounter of 06/12/18  . ED EKG  . 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 -Probably embolic phenomena from underlying A. fib Continue IV antibiotics and pain management as needed  No e/o osteomyelitis on XR. Seen by vascular surgery n.p.o. after midnight follow-up for possible angio tomorrow or sooner than June 15 Continue heparin drip  #Paroxysmal atrial fibrillation rate controlled continue heparin drip  #End-stage renal disease on hemodialysis Follow-up with nephrology  #Anemia of chronic kidney disease hemoglobin 9.5 stable Continue ESA with dialysis  #Essential hypertension-continue amlodipine, hydralazine and clonidine and titrate as needed    All the records are reviewed and case discussed with Care Management/Social Workerr. Management plans discussed with the patient, family and they are in agreement.  CODE STATUS: fc   TOTAL TIME TAKING CARE OF THIS PATIENT: 35 minutes.   POSSIBLE D/C IN  2-3 DAYS, DEPENDING ON CLINICAL CONDITION.  Note: This dictation was prepared with Dragon dictation along with smaller phrase technology. Any transcriptional errors that result  from this process are unintentional.   Nicholes Mango M.D on 06/13/2018 at 5:27 PM  Between 7am to 6pm - Pager - [phone] After 6pm go to [email or web address] - password [email or web address]  [facility]  Office  [phone]  CC: Primary care physician; [name], MD

## 2018-06-13 NOTE — Progress Notes (Signed)
ANTICOAGULATION CONSULT NOTE - Initial Consult  Pharmacy Consult for heparin drip Indication: PAD claudication rest pain  Allergies  Allergen Reactions  . Penicillin G Other (See Comments)    Other reaction(s): Unknown DID THE REACTION INVOLVE: Swelling of the face/tongue/throat, SOB, or low BP? Unknown Sudden or severe rash/hives, skin peeling, or the inside of the mouth or nose? Unknown Did it require medical treatment? Unknown When did it last happen?unknown If all above answers are "NO", may proceed with cephalosporin use.     Patient Measurements: Height: 6\' 1"  (185.4 cm) Weight: 160 lb 6.4 oz (72.8 kg) IBW/kg (Calculated) : 79.9 Heparin Dosing Weight: 73 kg  Vital Signs: Temp: 98.5 F (36.9 C) (01/12 0535) Temp Source: Oral (01/12 0535) BP: 135/51 (01/12 0535) Pulse Rate: 77 (01/12 0535)  Labs: Recent Labs    06/12/18 1641 06/13/18 0032 06/13/18 0501 06/13/18 1009  HGB 10.6*  --  9.5*  --   HCT 32.0*  --  29.7*  --   PLT 184  --  165  --   APTT  --  30  --   --   LABPROT  --  14.6  --   --   INR  --  1.15  --   --   HEPARINUNFRC  --   --   --  0.36  CREATININE 2.54*  --  3.93*  --     Estimated Creatinine Clearance: 17.5 mL/min (A) (by C-G formula based on SCr of 3.93 mg/dL (H)).   Medical History: Past Medical History:  Diagnosis Date  . Chronic kidney disease   . Depression   . Diabetes mellitus without complication ([place])   . Glaucoma   . Gout   . HLD (hyperlipidemia)   . Hypertension   . Prostate cancer ([place])   . Renal failure   . Stroke [facility])     Medications:  No anticoag in PTA meds  Assessment:  HEPARIN COURSE 4400 unit bolus and initial rate of 1100 units/hr. First heparin level 8 hours after start of infusion.  Goal of Therapy:  Heparin level 0.3-0.7 units/ml Monitor platelets by anticoagulation protocol: Yes   Plan:  1/12 10:09 HL therapeutic x 1. Continue current rate. Will recheck HL in 8 hours.    Jose [name] A  Jose [name] 06/13/2018,10:43 AM

## 2018-06-14 ENCOUNTER — Inpatient Hospital Stay: Admission: RE | Admit: 2018-06-14 | Payer: Medicare Other | Source: Ambulatory Visit

## 2018-06-14 LAB — GLUCOSE, CAPILLARY
Glucose-Capillary: 87 mg/dL (ref 70–99)
Glucose-Capillary: 83 mg/dL (ref 70–99)
Glucose-Capillary: 70 mg/dL (ref 70–99)
Glucose-Capillary: 143 mg/dL — ABNORMAL HIGH (ref 70–99)

## 2018-06-14 LAB — CBC
HCT: 28 % — ABNORMAL LOW (ref 39.0–52.0)
Hemoglobin: 9.1 g/dL — ABNORMAL LOW (ref 13.0–17.0)
MCH: 31.5 pg (ref 26.0–34.0)
MCHC: 32.5 g/dL (ref 30.0–36.0)
MCV: 96.9 fL (ref 80.0–100.0)
Platelets: 161 10*3/uL (ref 150–400)
RBC: 2.89 MIL/uL — ABNORMAL LOW (ref 4.22–5.81)
RDW: 15.1 % (ref 11.5–15.5)
WBC: 7.3 10*3/uL (ref 4.0–10.5)
nRBC: 0 % (ref 0.0–0.2)

## 2018-06-14 LAB — BASIC METABOLIC PANEL
Anion gap: 11 (ref 5–15)
BUN: 36 mg/dL — AB (ref 8–23)
CHLORIDE: 102 mmol/L (ref 98–111)
CO2: 27 mmol/L (ref 22–32)
CREATININE: 5.43 mg/dL — AB (ref 0.61–1.24)
Calcium: 9.1 mg/dL (ref 8.9–10.3)
GFR calc Af Amer: 11 mL/min — ABNORMAL LOW (ref >60)
GFR calc non Af Amer: 10 mL/min — ABNORMAL LOW (ref >60)
Glucose, Bld: 91 mg/dL (ref 70–99)
Potassium: 3.9 mmol/L (ref 3.5–5.1)
SODIUM: 140 mmol/L (ref 135–145)

## 2018-06-14 LAB — HEPARIN LEVEL (UNFRACTIONATED): Heparin Unfractionated: 0.38 IU/mL (ref 0.30–0.70)

## 2018-06-14 MED ORDER — EPOETIN ALFA 10000 UNIT/ML IJ SOLN
10000.0000 [IU] | INTRAMUSCULAR | Status: DC
  Administered 2018-06-15 – 2018-06-17 (×2): 10000 [IU] via INTRAVENOUS

## 2018-06-14 NOTE — Progress Notes (Signed)
ANTICOAGULATION CONSULT NOTE - Initial Consult  Pharmacy Consult for heparin drip Indication: PAD claudication rest pain  Allergies  Allergen Reactions  . Penicillin G Other (See Comments)    Other reaction(s): Unknown DID THE REACTION INVOLVE: Swelling of the face/tongue/throat, SOB, or low BP? Unknown Sudden or severe rash/hives, skin peeling, or the inside of the mouth or nose? Unknown Did it require medical treatment? Unknown When did it last happen?unknown If all above answers are "NO", may proceed with cephalosporin use.     Patient Measurements: Height: 6\' 1"  (185.4 cm) Weight: 160 lb 6.4 oz (72.8 kg) IBW/kg (Calculated) : 79.9 Heparin Dosing Weight: 73 kg  Vital Signs: Temp: 98.7 F (37.1 C) (01/13 0553) Temp Source: Oral (01/13 0553) BP: 111/65 (01/13 0553) Pulse Rate: 66 (01/13 0553)  Labs: Recent Labs    06/12/18 1641 06/13/18 0032 06/13/18 0501 06/13/18 1009 06/13/18 1807 06/14/18 0524  HGB 10.6*  --  9.5*  --   --  9.1*  HCT 32.0*  --  29.7*  --   --  28.0*  PLT 184  --  165  --   --  161  APTT  --  30  --   --   --   --   LABPROT  --  14.6  --   --   --   --   INR  --  1.15  --   --   --   --   HEPARINUNFRC  --   --   --  0.36 0.43 0.38  CREATININE 2.54*  --  3.93*  --   --  5.43*    Estimated Creatinine Clearance: 12.7 mL/min (A) (by C-G formula based on SCr of 5.43 mg/dL (H)).   Medical History: Past Medical History:  Diagnosis Date  . Chronic kidney disease   . Depression   . Diabetes mellitus without complication ([place])   . Glaucoma   . Gout   . HLD (hyperlipidemia)   . Hypertension   . Prostate cancer ([place])   . Renal failure   . Stroke [facility])     Medications:  No anticoag in PTA meds  Assessment:  HEPARIN COURSE 4400 unit bolus and initial rate of 1100 units/hr. First heparin level 8 hours after start of infusion.  1/12 10:09 HL 036   1/12  1807 HL 0.43   Goal of Therapy:  Heparin level 0.3-0.7 units/ml Monitor  platelets by anticoagulation protocol: Yes   Plan:  Heparin level therapeutic. Continue at current rate. Will start monitor heparin once daily with AM labs.   1/13 AM heparin level 0.38. Continue current regimen.  Recheck heparin level and CBC with tomorrow AM labs.  Eloise Harman, PharmD, BCPS Clinical Pharmacist  06/14/2018,7:12 AM

## 2018-06-14 NOTE — Progress Notes (Signed)
[place] [facility] [place] NAME: Jose [name]    MR#:  [id number]  DATE OF BIRTH:  03-19-1946  SUBJECTIVE:  CHIEF COMPLAINT: Patient is resting okay. NPO, scheduled for angiO today pain is manageable, does not hurt him that much  REVIEW OF SYSTEMS:  CONSTITUTIONAL: No fever, fatigue or weakness.  EYES: No blurred or double vision.  EARS, NOSE, AND THROAT: No tinnitus or ear pain.  RESPIRATORY: No cough, shortness of breath, wheezing or hemoptysis.  CARDIOVASCULAR: No chest pain, orthopnea, edema.  GASTROINTESTINAL: No nausea, vomiting, diarrhea or abdominal pain.  GENITOURINARY: No dysuria, hematuria.  ENDOCRINE: No polyuria, nocturia,  HEMATOLOGY: No anemia, easy bruising or bleeding SKIN: No rash or lesion. MUSCULOSKELETAL: Right third toe with discoloration and swelling NEUROLOGIC: No tingling, numbness, weakness.  PSYCHIATRY: No anxiety or depression.   DRUG ALLERGIES:   Allergies  Allergen Reactions  . Penicillin G Other (See Comments)    Other reaction(s): Unknown DID THE REACTION INVOLVE: Swelling of the face/tongue/throat, SOB, or low BP? Unknown Sudden or severe rash/hives, skin peeling, or the inside of the mouth or nose? Unknown Did it require medical treatment? Unknown When did it last happen?unknown If all above answers are "NO", may proceed with cephalosporin use.     VITALS:  Blood pressure (!) 118/52, pulse 63, temperature 98.2 F (36.8 C), temperature source Oral, resp. rate 17, height 6\' 1"  (1.854 m), weight 72.8 kg, SpO2 100 %.  PHYSICAL EXAMINATION:  GENERAL:  73 y.o.-year-old patient lying in the bed with no acute distress.  EYES: Pupils equal, round, reactive to light and accommodation. No scleral icterus. Extraocular muscles intact.  HEENT: Head atraumatic, normocephalic. Oropharynx and nasopharynx clear.  NECK:  Supple, no jugular venous distention. No thyroid enlargement, no tenderness.  LUNGS:  Normal breath sounds bilaterally, no wheezing, rales,rhonchi or crepitation. No use of accessory muscles of respiration.  CARDIOVASCULAR: S1, S2 normal. No murmurs, rubs, or gallops.  ABDOMEN: Soft, nontender, nondistended. Bowel sounds present. No organomegaly or mass.  EXTREMITIES: Right third toe ulceration with dark with dark discoloration and NEUROLOGIC: Awake, alert and oriented x3 sensation intact. Gait not checked.  PSYCHIATRIC: The patient is alert and oriented x 3.  SKIN: No obvious rash, lesion, or ulcer.    LABORATORY PANEL:   CBC Recent Labs  Lab 06/14/18 0524  WBC 7.3  HGB 9.1*  HCT 28.0*  PLT 161   ------------------------------------------------------------------------------------------------------------------  Chemistries  Recent Labs  Lab 06/12/18 1641  06/14/18 0524  NA 139   < > 140  K 3.1*   < > 3.9  CL 98   < > 102  CO2 30   < > 27  GLUCOSE 84   < > 91  BUN 13   < > 36*  CREATININE 2.54*   < > 5.43*  CALCIUM 8.4*   < > 9.1  AST 25  --   --   ALT 15  --   --   ALKPHOS 40  --   --   BILITOT 0.3  --   --    < > = values in this interval not displayed.   ------------------------------------------------------------------------------------------------------------------  Cardiac Enzymes No results for input(s): TROPONINI in the last 168 hours. ------------------------------------------------------------------------------------------------------------------  RADIOLOGY:  Dg Chest 2 View  Result Date: 06/12/2018 CLINICAL DATA:  Right arm swelling after central line placement. EXAM: CHEST - 2 VIEW COMPARISON:  03/21/2018 FINDINGS: Right jugular dual-lumen central venous dialysis catheter is seen in appropriate  position. No evidence of pneumothorax or pleural effusion. Heart size is at the upper limits of normal. Tortuosity of thoracic aorta is stable. Both lungs are well aerated and clear. IMPRESSION: Right jugular dual-lumen central venous dialysis  catheter in appropriate position. No evidence of pneumothorax. No active lung disease. Electronically Signed   By: Earle Gell M.D.   On: 06/12/2018 17:42   Dg Foot Complete Right  Result Date: 06/13/2018 CLINICAL DATA:  Infection to the right third toe. Diabetes. EXAM: RIGHT FOOT COMPLETE - 3+ VIEW COMPARISON:  05/26/2018 FINDINGS: Diffuse bone demineralization. Mild hallux valgus deformity. No evidence of acute fracture or dislocation. No focal bone lesion or bone destruction. Soft tissue gas underneath the nail bed of the third toe suggesting soft tissue infection. No underlying bone changes to suggest osteomyelitis. IMPRESSION: No acute bony abnormalities. Soft tissue gas under the third toenail suggesting soft tissue infection. No changes to suggest osteomyelitis. Electronically Signed   By: Lucienne Capers M.D.   On: 06/13/2018 00:15    EKG:   Orders placed or performed during the hospital encounter of 06/12/18  . ED EKG  . 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 -Probably embolic phenomena from underlying A. fib Continue IV antibiotics and pain management as needed  No e/o osteomyelitis on XR. Seen by vascular surgery n.p.o. after midnight   for  angio TODAY Continue heparin drip  #Paroxysmal atrial fibrillation rate controlled continue heparin drip  #End-stage renal disease on hemodialysis Follow-up with nephrology  #Anemia of chronic kidney disease hemoglobin 9.5 stable Continue ESA with dialysis  #Essential hypertension-continue amlodipine, hydralazine and clonidine and titrate as needed    All the records are reviewed and case discussed with Care Management/Social Workerr. Management plans discussed with the patient, family and they are in agreement.  CODE STATUS: fc   TOTAL TIME TAKING CARE OF THIS PATIENT: 35 minutes.   POSSIBLE D/C IN  2-3 DAYS, DEPENDING ON CLINICAL CONDITION.  Note: This dictation was  prepared with Dragon dictation along with smaller phrase technology. Any transcriptional errors that result from this process are unintentional.   Nicholes Mango M.D on 06/14/2018 at 11:59 AM  Between 7am to 6pm - Pager - [phone] After 6pm go to [email or web address] - password [email or web address] [facility]  [facility]  Office  [phone]  CC: Primary care physician; [name], MD

## 2018-06-14 NOTE — Progress Notes (Signed)
[facility], [place] 06/14/18  Subjective:  Patient due for angiogram today. Still having pain in the right third toe. Resting in bed at the moment.   Objective:  Vital signs in last 24 hours:  Temp:  [98.2 F (36.8 C)-98.9 F (37.2 C)] 98.4 F (36.9 C) (01/13 1400) Pulse Rate:  [62-78] 62 (01/13 1400) Resp:  [17-19] 18 (01/13 1400) BP: (111-152)/(52-71) 117/60 (01/13 1400) SpO2:  [97 %-100 %] 100 % (01/13 1400)  Weight change:  Filed Weights   06/12/18 1639  Weight: 72.8 kg    Intake/Output:    Intake/Output Summary (Last 24 hours) at 06/14/2018 1429 Last data filed at 06/14/2018 1219 Gross per 24 hour  Intake 555.68 ml  Output 375 ml  Net 180.68 ml     Physical Exam: General:  No acute distress, laying in the bed  HEENT  moist mucous membranes  Neck  supple  Pulm/lungs  normal breathing effort on room air, mild scattered rhonchi  CVS/Heart  irregular rhythm  Abdomen:   Soft, nontender  Extremities:  No edema, right foot wrapped  Neurologic:  Alert, able to answer questions appropriately  Skin:  No acute rashes  Access:  IJ PermCath, left upper extremity AV fistula developing       Basic Metabolic Panel:  Recent Labs  Lab 06/08/18 1130 06/09/18 1041 06/12/18 1641 06/13/18 0501 06/14/18 0524  NA  --  142 139 141 140  K 3.9 3.4* 3.1* 3.2* 3.9  CL  --   --  98 100 102  CO2  --   --  30 31 27   GLUCOSE  --  91 84 143* 91  BUN  --   --  13 25* 36*  CREATININE  --   --  2.54* 3.93* 5.43*  CALCIUM  --   --  8.4* 8.7* 9.1     CBC: Recent Labs  Lab 06/09/18 1041 06/12/18 1641 06/13/18 0501 06/14/18 0524  WBC  --  8.5 6.9 7.3  NEUTROABS  --  4.5  --   --   HGB 10.9* 10.6* 9.5* 9.1*  HCT 32.0* 32.0* 29.7* 28.0*  MCV  --  97.3 96.7 96.9  PLT  --  184 165 161      Lab Results  Component Value Date   HEPBSAG Negative 03/22/2018   HEPBSAB Reactive 03/22/2018   HEPBIGM Negative 03/22/2018       Microbiology:  Recent Results (from the past 240 hour(s))  Blood Culture (routine x 2)     Status: None (Preliminary result)   Collection Time: 06/13/18 12:32 AM  Result Value Ref Range Status   Specimen Description BLOOD RIGHT FOREARM  Final   Special Requests   Final    BOTTLES DRAWN AEROBIC ONLY Blood Culture adequate volume   Culture   Final    NO GROWTH 1 DAY Performed at [facility], [address] [place], [place] [address]    Report Status PENDING  Incomplete  Blood Culture (routine x 2)     Status: None (Preliminary result)   Collection Time: 06/13/18 12:32 AM  Result Value Ref Range Status   Specimen Description BLOOD RIGHT FOREARM UPPER  Final   Special Requests   Final    BOTTLES DRAWN AEROBIC AND ANAEROBIC Blood Culture results may not be optimal due to an excessive volume of blood received in culture bottles   Culture   Final    NO GROWTH 1 DAY Performed at [facility],  [place], [place] [address]    Report Status PENDING  Incomplete  MRSA PCR Screening     Status: None   Collection Time: 06/13/18  7:51 AM  Result Value Ref Range Status   MRSA by PCR NEGATIVE NEGATIVE Final    Comment:        The GeneXpert MRSA Assay (FDA approved for NASAL specimens only), is one component of a comprehensive MRSA colonization surveillance program. It is not intended to diagnose MRSA infection nor to guide or monitor treatment for MRSA infections. Performed at [facility], [address] [place], [place] [address]     Coagulation Studies: Recent Labs    06/13/18 0032  LABPROT 14.6  INR 1.15    Urinalysis: Recent Labs    06/13/18 1142  COLORURINE YELLOW*  LABSPEC 1.017  PHURINE 6.0  GLUCOSEU NEGATIVE  HGBUR NEGATIVE  BILIRUBINUR NEGATIVE  KETONESUR NEGATIVE  PROTEINUR 100*  NITRITE NEGATIVE  LEUKOCYTESUR TRACE*      Imaging: Dg Chest 2 View  Result Date: 06/12/2018 CLINICAL DATA:  Right arm  swelling after central line placement. EXAM: CHEST - 2 VIEW COMPARISON:  03/21/2018 FINDINGS: Right jugular dual-lumen central venous dialysis catheter is seen in appropriate position. No evidence of pneumothorax or pleural effusion. Heart size is at the upper limits of normal. Tortuosity of thoracic aorta is stable. Both lungs are well aerated and clear. IMPRESSION: Right jugular dual-lumen central venous dialysis catheter in appropriate position. No evidence of pneumothorax. No active lung disease. Electronically Signed   By: Earle Gell M.D.   On: 06/12/2018 17:42   Dg Foot Complete Right  Result Date: 06/13/2018 CLINICAL DATA:  Infection to the right third toe. Diabetes. EXAM: RIGHT FOOT COMPLETE - 3+ VIEW COMPARISON:  05/26/2018 FINDINGS: Diffuse bone demineralization. Mild hallux valgus deformity. No evidence of acute fracture or dislocation. No focal bone lesion or bone destruction. Soft tissue gas underneath the nail bed of the third toe suggesting soft tissue infection. No underlying bone changes to suggest osteomyelitis. IMPRESSION: No acute bony abnormalities. Soft tissue gas under the third toenail suggesting soft tissue infection. No changes to suggest osteomyelitis. Electronically Signed   By: Lucienne Capers M.D.   On: 06/13/2018 00:15     Medications:   . heparin 1,100 Units/hr (06/14/18 1219)  . piperacillin-tazobactam (ZOSYN)  IV Stopped (06/14/18 0913)   . amLODipine  10 mg Oral Daily  . aspirin EC  325 mg Oral Daily  . cloNIDine  0.2 mg Oral BID  . escitalopram  10 mg Oral Daily  . hydrALAZINE  25 mg Oral Q8H  . insulin aspart  0-9 Units Subcutaneous TID WC   acetaminophen **OR** acetaminophen, morphine injection, ondansetron **OR** ondansetron (ZOFRAN) IV, oxyCODONE  Assessment/ Plan:  73 y.o.African American male with esrd, hypertension, gout, depression, diabetes mellitus type II, history of prostate cancer, Atrial fibrillation who was admitted to [facility] for rt foot  gangrene  [facility]/TTS/  End-stage renal disease - patient due for dialysis again tomorrow.  We will prepare orders.  Secondary hyperparathyroidism Recheck serum phosphorus with dialysis tomorrow.  Anemia of chronic kidney disease -start the patient on Epogen 10,000 units IV with dialysis.  Right foot third toe ulceration and pain -Angiogram by vascular surgery today.   LOS: 1 Keyondre Hepburn 1/13/20202:29 PM  [place], [place]  Note: This note was prepared with Dragon dictation. Any transcription errors are unintentional

## 2018-06-14 NOTE — Progress Notes (Signed)
[facility] Daily Progress Note   Vascular Surgery Communication Note  Unable to complete angiogram today due to scheduling issues. Will plan on right lower extremity angiogram with possible intervention on Wed (06/16/18) with Dr. Lucky Cowboy.  Will pre-op.  Marcelle Overlie PA-C 06/14/2018 5:05 PM

## 2018-06-15 ENCOUNTER — Other Ambulatory Visit (INDEPENDENT_AMBULATORY_CARE_PROVIDER_SITE_OTHER): Payer: Self-pay | Admitting: Vascular Surgery

## 2018-06-15 LAB — GLUCOSE, CAPILLARY
GLUCOSE-CAPILLARY: 75 mg/dL (ref 70–99)
GLUCOSE-CAPILLARY: 84 mg/dL (ref 70–99)
Glucose-Capillary: 77 mg/dL (ref 70–99)
Glucose-Capillary: 140 mg/dL — ABNORMAL HIGH (ref 70–99)

## 2018-06-15 LAB — CBC
HCT: 27 % — ABNORMAL LOW (ref 39.0–52.0)
Hemoglobin: 8.7 g/dL — ABNORMAL LOW (ref 13.0–17.0)
MCH: 31.6 pg (ref 26.0–34.0)
MCHC: 32.2 g/dL (ref 30.0–36.0)
MCV: 98.2 fL (ref 80.0–100.0)
Platelets: 163 10*3/uL (ref 150–400)
RBC: 2.75 MIL/uL — ABNORMAL LOW (ref 4.22–5.81)
RDW: 15.2 % (ref 11.5–15.5)
WBC: 7.1 10*3/uL (ref 4.0–10.5)
nRBC: 0 % (ref 0.0–0.2)

## 2018-06-15 LAB — BASIC METABOLIC PANEL
Anion gap: 12 (ref 5–15)
BUN: 47 mg/dL — ABNORMAL HIGH (ref 8–23)
CO2: 24 mmol/L (ref 22–32)
Calcium: 8.9 mg/dL (ref 8.9–10.3)
Chloride: 102 mmol/L (ref 98–111)
Creatinine, Ser: 6.41 mg/dL — ABNORMAL HIGH (ref 0.61–1.24)
GFR calc non Af Amer: 8 mL/min — ABNORMAL LOW (ref >60)
GFR, EST AFRICAN AMERICAN: 9 mL/min — AB (ref >60)
Glucose, Bld: 85 mg/dL (ref 70–99)
Potassium: 4.2 mmol/L (ref 3.5–5.1)
Sodium: 138 mmol/L (ref 135–145)

## 2018-06-15 LAB — HEPARIN LEVEL (UNFRACTIONATED)
Heparin Unfractionated: 0.3 IU/mL (ref 0.30–0.70)
Heparin Unfractionated: 0.34 IU/mL (ref 0.30–0.70)

## 2018-06-15 LAB — PHOSPHORUS: Phosphorus: 4.9 mg/dL — ABNORMAL HIGH (ref 2.5–4.6)

## 2018-06-15 MED ORDER — CLINDAMYCIN PHOSPHATE 300 MG/50ML IV SOLN: 300.0000 mg | Freq: Once | INTRAVENOUS | Status: DC

## 2018-06-15 NOTE — Progress Notes (Signed)
ANTICOAGULATION CONSULT NOTE   Pharmacy Consult for heparin drip Indication: PAD claudication rest pain  Patient Measurements: Height: 6\' 1"  (185.4 cm) Weight: 160 lb 6.4 oz (72.8 kg) IBW/kg (Calculated) : 79.9 Heparin Dosing Weight: 73 kg  Vital Signs: Temp: 98.4 F (36.9 C) (01/14 0550) Temp Source: Oral (01/14 0550) BP: 125/65 (01/14 0550) Pulse Rate: 39 (01/14 0552)  Labs: Recent Labs    06/13/18 0032 06/13/18 0501  06/13/18 1807 06/14/18 0524 06/15/18 0357  HGB  --  9.5*  --   --  9.1* 8.7*  HCT  --  29.7*  --   --  28.0* 27.0*  PLT  --  165  --   --  161 163  APTT 30  --   --   --   --   --   LABPROT 14.6  --   --   --   --   --   INR 1.15  --   --   --   --   --   HEPARINUNFRC  --   --    < > 0.43 0.38 0.30  CREATININE  --  3.93*  --   --  5.43* 6.41*   < > = values in this interval not displayed.    Estimated Creatinine Clearance: 10.7 mL/min (A) (by C-G formula based on SCr of 6.41 mg/dL (H)).   Medical History: Past Medical History:  Diagnosis Date  . Chronic kidney disease   . Depression   . Diabetes mellitus without complication ([facility])   . Glaucoma   . Gout   . HLD (hyperlipidemia)   . Hypertension   . Prostate cancer ([place])   . Renal failure   . Stroke [facility])     Medications:  No anticoag in PTA meds  HEPARIN COURSE 1/12 Initiation: 4400 unit bolus and initial rate of 1100 units/hr 1/12 1009 HL 0.36   1/12 1807 HL 0.43  1/13 0524 HL 0.38 1/13 0357 HL 0.30 1/13 0956 HL 0.34  Goal of Therapy:  Heparin level 0.3-0.7 units/ml Monitor platelets by anticoagulation protocol: Yes   Plan:  The follow-up heparin level from this morning that was drawn due to a borderline level is also therapeutic.  Will continue current rate and will recheck in the morning of 1/15. Hgb is noted to be trending down with no reported bleeding. Will continue to monitor.  Dallie Piles, PharmD Clinical Pharmacist  06/15/2018,9:00 AM

## 2018-06-15 NOTE — Progress Notes (Signed)
HD tx start    06/15/18 0929  Vital Signs  Pulse Rate 62  Pulse Rate Source Monitor  Resp (!) 21  BP (!) 147/52  BP Location Right Arm  BP Method Automatic  Patient Position (if appropriate) Lying  Oxygen Therapy  SpO2 99 %  O2 Device Room Air  During Hemodialysis Assessment  Blood Flow Rate (mL/min) 400 mL/min  Arterial Pressure (mmHg) -170 mmHg  Venous Pressure (mmHg) 110 mmHg  Transmembrane Pressure (mmHg) 60 mmHg  Ultrafiltration Rate (mL/min) 700 mL/min  Dialysate Flow Rate (mL/min) 800 ml/min  Conductivity: Machine  14.2  HD Safety Checks Performed Yes  Dialysis Fluid Bolus Normal Saline  Bolus Amount (mL) 250 mL  Intra-Hemodialysis Comments Tx initiated  Hemodialysis Catheter Right Internal jugular  Placement Date/Time: 03/24/18 1658   Time Out: Correct patient;Correct site;Correct procedure  Maximum sterile barrier precautions: Hand hygiene;Sterile gloves;Cap;Large sterile sheet;Mask;Sterile gown  Site Prep: Chlorhexidine  Local Anesthetic: Inje...  Blue Lumen Status Infusing  Red Lumen Status Infusing

## 2018-06-15 NOTE — Progress Notes (Signed)
Dialysate bath changed r/t a potassium of 4.2, MD aware.    06/15/18 1015  Vital Signs  Pulse Rate 74  Pulse Rate Source Monitor  Resp (!) 21  BP 131/70  BP Location Right Arm  BP Method Automatic  Patient Position (if appropriate) Lying  Oxygen Therapy  SpO2 98 %  O2 Device Room Air  Pre Treatment Patient Checks  Dialysate 3K, 2.5 Ca (r/t a potassium of 4.2, MD aware)  During Hemodialysis Assessment  Blood Flow Rate (mL/min) 400 mL/min  Arterial Pressure (mmHg) -180 mmHg  Venous Pressure (mmHg) 110 mmHg  Transmembrane Pressure (mmHg) 60 mmHg  Ultrafiltration Rate (mL/min) 700 mL/min  Dialysate Flow Rate (mL/min) 800 ml/min  Conductivity: Machine  15.4  HD Safety Checks Performed Yes  Intra-Hemodialysis Comments Progressing as prescribed (662)

## 2018-06-15 NOTE — Progress Notes (Signed)
Pre HD assessment    06/15/18 0924  Neurological  Level of Consciousness Alert  Orientation Level Oriented X4  Respiratory  Respiratory Pattern Regular;Unlabored  Chest Assessment Chest expansion symmetrical  Cardiac  Pulse Irregular  ECG Monitor Yes  Cardiac Rhythm NSR;SB  Vascular  R Radial Pulse +2  L Radial Pulse +2  Edema Generalized;Right lower extremity;Left lower extremity  Integumentary  Integumentary (WDL) X  Musculoskeletal  Musculoskeletal (WDL) X  Generalized Weakness Yes  Assistive Device None  GU Assessment  Genitourinary (WDL) X  Genitourinary Symptoms  (HD)  Psychosocial  Psychosocial (WDL) WDL

## 2018-06-15 NOTE — Progress Notes (Signed)
[place] at [place] NAME: Jose [name]    MR#:  [id number]  DATE OF BIRTH:  1946/02/23  SUBJECTIVE:  CHIEF COMPLAINT: Patient is resting okay. For HD today, scheduled for angiO tomorrow  pain is well controlled   REVIEW OF SYSTEMS:  CONSTITUTIONAL: No fever, fatigue or weakness.  EYES: No blurred or double vision.  EARS, NOSE, AND THROAT: No tinnitus or ear pain.  RESPIRATORY: No cough, shortness of breath, wheezing or hemoptysis.  CARDIOVASCULAR: No chest pain, orthopnea, edema.  GASTROINTESTINAL: No nausea, vomiting, diarrhea or abdominal pain.  GENITOURINARY: No dysuria, hematuria.  ENDOCRINE: No polyuria, nocturia,  HEMATOLOGY: No anemia, easy bruising or bleeding SKIN: No rash or lesion. MUSCULOSKELETAL: Right third toe with discoloration and swelling NEUROLOGIC: No tingling, numbness, weakness.  PSYCHIATRY: No anxiety or depression.   DRUG ALLERGIES:   Allergies  Allergen Reactions  . Penicillin G Other (See Comments)    Other reaction(s): Unknown DID THE REACTION INVOLVE: Swelling of the face/tongue/throat, SOB, or low BP? Unknown Sudden or severe rash/hives, skin peeling, or the inside of the mouth or nose? Unknown Did it require medical treatment? Unknown When did it last happen?unknown If all above answers are "NO", may proceed with cephalosporin use.     VITALS:  Blood pressure 133/62, pulse (!) 52, temperature 97.8 F (36.6 C), temperature source Oral, resp. rate 18, height 6\' 1"  (1.854 m), weight 67.5 kg, SpO2 100 %.  PHYSICAL EXAMINATION:  GENERAL:  73 y.o.-year-old patient lying in the bed with no acute distress.  EYES: Pupils equal, round, reactive to light and accommodation. No scleral icterus. Extraocular muscles intact.  HEENT: Head atraumatic, normocephalic. Oropharynx and nasopharynx clear.  NECK:  Supple, no jugular venous distention. No thyroid enlargement, no tenderness.  LUNGS: Normal breath  sounds bilaterally, no wheezing, rales,rhonchi or crepitation. No use of accessory muscles of respiration.  CARDIOVASCULAR: S1, S2 normal. No murmurs, rubs, or gallops.  ABDOMEN: Soft, nontender, nondistended. Bowel sounds present. No organomegaly or mass.  EXTREMITIES: Right third toe ulceration with dark with dark discoloration and NEUROLOGIC: Awake, alert and oriented x3 sensation intact. Gait not checked.  PSYCHIATRIC: The patient is alert and oriented x 3.  SKIN: No obvious rash, lesion, or ulcer.    LABORATORY PANEL:   CBC Recent Labs  Lab 06/15/18 0357  WBC 7.1  HGB 8.7*  HCT 27.0*  PLT 163   ------------------------------------------------------------------------------------------------------------------  Chemistries  Recent Labs  Lab 06/12/18 1641  06/15/18 0357  NA 139   < > 138  K 3.1*   < > 4.2  CL 98   < > 102  CO2 30   < > 24  GLUCOSE 84   < > 85  BUN 13   < > 47*  CREATININE 2.54*   < > 6.41*  CALCIUM 8.4*   < > 8.9  AST 25  --   --   ALT 15  --   --   ALKPHOS 40  --   --   BILITOT 0.3  --   --    < > = values in this interval not displayed.   ------------------------------------------------------------------------------------------------------------------  Cardiac Enzymes No results for input(s): TROPONINI in the last 168 hours. ------------------------------------------------------------------------------------------------------------------  RADIOLOGY:  No results found.  EKG:   Orders placed or performed during the hospital encounter of 06/12/18  . ED EKG  . 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 -Probably embolic phenomena from underlying A. fib Continue IV antibiotics and pain management as needed  No e/o osteomyelitis on XR. Seen by vascular surgery n.p.o. after midnight   for  angio tomorrow Continue heparin drip  #Paroxysmal atrial fibrillation rate controlled continue  heparin drip  #End-stage renal disease on hemodialysis Follow-up with nephrology-for hemodialysis today  #Anemia of chronic kidney disease hemoglobin  stable Continue ESA with dialysis  #Essential hypertension-continue amlodipine, hydralazine and clonidine and titrate as needed    All the records are reviewed and case discussed with Care Management/Social Workerr. Management plans discussed with the patient, family and they are in agreement.  CODE STATUS: fc   TOTAL TIME TAKING CARE OF THIS PATIENT: 35 minutes.   POSSIBLE D/C IN  2-3 DAYS, DEPENDING ON CLINICAL CONDITION.  Note: This dictation was prepared with Dragon dictation along with smaller phrase technology. Any transcriptional errors that result from this process are unintentional.   Nicholes Mango M.D on 06/15/2018 at 10:20 PM  Between 7am to 6pm - Pager - [phone] After 6pm go to [email or web address] - password [email or web address] [facility]  [facility]  Office  [phone]  CC: Primary care physician; [name], MD

## 2018-06-15 NOTE — Progress Notes (Signed)
Post HD assessment. Pt tolerated tx well without c/o or complication. Net UF 2036, goal met.    06/15/18 1316  Hand-Off documentation  Report given to (Full Name) Stark Bray  Report received from (Full Name) Josh Simser,RN  Vital Signs  Temp 98.5 F (36.9 C)  Temp Source Oral  Pulse Rate 67  Pulse Rate Source Monitor  Resp 12  BP (!) 160/77  BP Location Right Arm  BP Method Automatic  Patient Position (if appropriate) Lying  Oxygen Therapy  SpO2 99 %  O2 Device Room Air  Dialysis Weight  Weight 67.5 kg  Type of Weight Post-Dialysis  Post-Hemodialysis Assessment  Rinseback Volume (mL) 250 mL  KECN 80.3 V  Dialyzer Clearance Lightly streaked  Duration of HD Treatment -hour(s) 3.5 hour(s)  Hemodialysis Intake (mL) 500 mL  UF Total -Machine (mL) 2536 mL  Net UF (mL) 2036 mL  Tolerated HD Treatment Yes  Education / Care Plan  Dialysis Education Provided Yes  Documented Education in Care Plan Yes  Hemodialysis Catheter Right Internal jugular  Placement Date/Time: 03/24/18 1658   Time Out: Correct patient;Correct site;Correct procedure  Maximum sterile barrier precautions: Hand hygiene;Sterile gloves;Cap;Large sterile sheet;Mask;Sterile gown  Site Prep: Chlorhexidine  Local Anesthetic: Inje...  Site Condition No complications  Blue Lumen Status Heparin locked  Red Lumen Status Heparin locked  Purple Lumen Status N/A  Catheter fill solution Heparin 1000 units/ml  Catheter fill volume (Arterial) 1.5 cc  Catheter fill volume (Venous) 1.5  Dressing Type Biopatch  Dressing Status Clean;Dry;Intact  Interventions New dressing  Drainage Description None  Dressing Change Due 06/22/18  Post treatment catheter status Capped and Clamped

## 2018-06-15 NOTE — Progress Notes (Signed)
[facility], [place] 06/15/18  Subjective:  Patient seen and evaluated during hemodialysis.  Blood flow rate 400 with ultrafiltration target of 2 kg.   Objective:  Vital signs in last 24 hours:  Temp:  [98 F (36.7 C)-98.4 F (36.9 C)] 98.2 F (36.8 C) (01/14 0923) Pulse Rate:  [39-86] 74 (01/14 1015) Resp:  [17-30] 21 (01/14 1015) BP: (117-153)/(52-70) 131/70 (01/14 1015) SpO2:  [97 %-100 %] 98 % (01/14 1015) Weight:  [68.9 kg] 68.9 kg (01/14 0923)  Weight change:  Filed Weights   06/12/18 1639 06/15/18 0923  Weight: 72.8 kg 68.9 kg    Intake/Output:    Intake/Output Summary (Last 24 hours) at 06/15/2018 1040 Last data filed at 06/15/2018 0612 Gross per 24 hour  Intake 432.31 ml  Output 175 ml  Net 257.31 ml     Physical Exam: General:  No acute distress, laying in the bed  HEENT  moist mucous membranes  Neck  supple  Pulm/lungs  normal breathing effort on room air, mild scattered rhonchi  CVS/Heart  irregular rhythm  Abdomen:   Soft, nontender  Extremities:  No edema, right foot wrapped  Neurologic:  Alert, able to answer questions appropriately  Skin:  No acute rashes  Access:  IJ PermCath, left upper extremity AV fistula developing       Basic Metabolic Panel:  Recent Labs  Lab 06/09/18 1041  06/12/18 1641 06/13/18 0501 06/14/18 0524 06/15/18 0357 06/15/18 0956  NA 142  --  139 141 140 138  --   K 3.4*  --  3.1* 3.2* 3.9 4.2  --   CL  --   --  98 100 102 102  --   CO2  --   --  30 31 27 24   --   GLUCOSE 91  --  84 143* 91 85  --   BUN  --   --  13 25* 36* 47*  --   CREATININE  --   --  2.54* 3.93* 5.43* 6.41*  --   CALCIUM  --    < > 8.4* 8.7* 9.1 8.9  --   PHOS  --   --   --   --   --   --  4.9*   < > = values in this interval not displayed.     CBC: Recent Labs  Lab 06/09/18 1041 06/12/18 1641 06/13/18 0501 06/14/18 0524 06/15/18 0357  WBC  --  8.5 6.9 7.3 7.1  NEUTROABS  --  4.5  --   --   --   HGB  10.9* 10.6* 9.5* 9.1* 8.7*  HCT 32.0* 32.0* 29.7* 28.0* 27.0*  MCV  --  97.3 96.7 96.9 98.2  PLT  --  184 165 161 163      Lab Results  Component Value Date   HEPBSAG Negative 03/22/2018   HEPBSAB Reactive 03/22/2018   HEPBIGM Negative 03/22/2018      Microbiology:  Recent Results (from the past 240 hour(s))  Blood Culture (routine x 2)     Status: None (Preliminary result)   Collection Time: 06/13/18 12:32 AM  Result Value Ref Range Status   Specimen Description BLOOD RIGHT FOREARM  Final   Special Requests   Final    BOTTLES DRAWN AEROBIC ONLY Blood Culture adequate volume   Culture   Final    NO GROWTH 2 DAYS Performed at [facility], [address], [place], [place] [address]    Report Status PENDING  Incomplete  Blood Culture (routine x 2)     Status: None (Preliminary result)   Collection Time: 06/13/18 12:32 AM  Result Value Ref Range Status   Specimen Description BLOOD RIGHT FOREARM UPPER  Final   Special Requests   Final    BOTTLES DRAWN AEROBIC AND ANAEROBIC Blood Culture results may not be optimal due to an excessive volume of blood received in culture bottles   Culture   Final    NO GROWTH 2 DAYS Performed at [facility], [address] [place], [place] [address]    Report Status PENDING  Incomplete  MRSA PCR Screening     Status: None   Collection Time: 06/13/18  7:51 AM  Result Value Ref Range Status   MRSA by PCR NEGATIVE NEGATIVE Final    Comment:        The GeneXpert MRSA Assay (FDA approved for NASAL specimens only), is one component of a comprehensive MRSA colonization surveillance program. It is not intended to diagnose MRSA infection nor to guide or monitor treatment for MRSA infections. Performed at [facility], [address] [place], [place] [address]     Coagulation Studies: Recent Labs    06/13/18 0032  LABPROT 14.6  INR 1.15    Urinalysis: Recent Labs    06/13/18 1142  COLORURINE  YELLOW*  LABSPEC 1.017  PHURINE 6.0  GLUCOSEU NEGATIVE  HGBUR NEGATIVE  BILIRUBINUR NEGATIVE  KETONESUR NEGATIVE  PROTEINUR 100*  NITRITE NEGATIVE  LEUKOCYTESUR TRACE*      Imaging: No results found.   Medications:   . heparin 1,100 Units/hr (06/15/18 0612)  . piperacillin-tazobactam (ZOSYN)  IV Stopped (06/15/18 0137)   . amLODipine  10 mg Oral Daily  . aspirin EC  325 mg Oral Daily  . cloNIDine  0.2 mg Oral BID  . epoetin (EPOGEN/PROCRIT) injection  10,000 Units Intravenous Q T,Th,Sa-HD  . escitalopram  10 mg Oral Daily  . hydrALAZINE  25 mg Oral Q8H  . insulin aspart  0-9 Units Subcutaneous TID WC   acetaminophen **OR** acetaminophen, morphine injection, ondansetron **OR** ondansetron (ZOFRAN) IV, oxyCODONE  Assessment/ Plan:  73 y.o.African American male with esrd, hypertension, gout, depression, diabetes mellitus type II, history of prostate cancer, Atrial fibrillation who was admitted to [facility] for rt foot gangrene  [facility]/TTS/  End-stage renal disease -Patient seen and evaluated during hemodialysis.  Tolerating well thus far.  Secondary hyperparathyroidism Phosphorus 4.9 and at target.  Continue to periodically monitor.  Anemia of chronic kidney disease -Continue Epogen 10,000 units IV with dialysis.  Right foot third toe ulceration and pain -Angiogram by vascular for Wednesday.     LOS: 2 Jose [name] 1/14/202010:40 AM  [place], [place]  Note: This note was prepared with Dragon dictation. Any transcription errors are unintentional

## 2018-06-15 NOTE — Care Management (Signed)
[name] dialysis liaison notified of admission.   Patient Jose [name] at [facility]

## 2018-06-15 NOTE — Progress Notes (Signed)
HD tx end    06/15/18 1309  Vital Signs  Pulse Rate (!) 43  Pulse Rate Source Monitor  Resp 18  BP (!) 145/61  BP Location Right Arm  BP Method Automatic  Patient Position (if appropriate) Lying  Oxygen Therapy  SpO2 100 %  O2 Device Room Air  During Hemodialysis Assessment  Dialysis Fluid Bolus Normal Saline  Bolus Amount (mL) 250 mL  Intra-Hemodialysis Comments Tx completed

## 2018-06-15 NOTE — Plan of Care (Signed)
  Problem: Health Behavior/Discharge Planning: Goal: Ability to manage health-related needs will improve Outcome: Progressing   Problem: Clinical Measurements: Goal: Ability to maintain clinical measurements within normal limits will improve Outcome: Progressing Goal: Will remain free from infection Outcome: Progressing Goal: Diagnostic test results will improve Outcome: Progressing Goal: Respiratory complications will improve Outcome: Progressing Goal: Cardiovascular complication will be avoided Outcome: Progressing   Problem: Nutrition: Goal: Adequate nutrition will be maintained Outcome: Progressing Patient tolerating advance in diet.

## 2018-06-15 NOTE — Progress Notes (Signed)
Post HD assessment    06/15/18 1315  Neurological  Level of Consciousness Alert  Orientation Level Oriented X4  Respiratory  Respiratory Pattern Regular;Unlabored  Chest Assessment Chest expansion symmetrical  Cardiac  Pulse Irregular  ECG Monitor Yes  Cardiac Rhythm Atrial fibrillation;SB  Vascular  R Radial Pulse +2  L Radial Pulse +2  Edema Generalized;Right lower extremity;Left lower extremity  Integumentary  Integumentary (WDL) X  Musculoskeletal  Musculoskeletal (WDL) X  Generalized Weakness Yes  Assistive Device None  GU Assessment  Genitourinary (WDL) X  Genitourinary Symptoms  (HD)  Psychosocial  Psychosocial (WDL) WDL

## 2018-06-15 NOTE — Progress Notes (Signed)
ANTICOAGULATION CONSULT NOTE - Initial Consult  Pharmacy Consult for heparin drip Indication: PAD claudication rest pain  Allergies  Allergen Reactions  . Penicillin G Other (See Comments)    Other reaction(s): Unknown DID THE REACTION INVOLVE: Swelling of the face/tongue/throat, SOB, or low BP? Unknown Sudden or severe rash/hives, skin peeling, or the inside of the mouth or nose? Unknown Did it require medical treatment? Unknown When did it last happen?unknown If all above answers are "NO", may proceed with cephalosporin use.     Patient Measurements: Height: 6\' 1"  (185.4 cm) Weight: 160 lb 6.4 oz (72.8 kg) IBW/kg (Calculated) : 79.9 Heparin Dosing Weight: 73 kg  Vital Signs: Temp: 98 F (36.7 C) (01/13 2110) Temp Source: Oral (01/13 2110) BP: 153/68 (01/13 2110) Pulse Rate: 74 (01/13 2110)  Labs: Recent Labs    06/13/18 0032 06/13/18 0501  06/13/18 1807 06/14/18 0524 06/15/18 0357  HGB  --  9.5*  --   --  9.1* 8.7*  HCT  --  29.7*  --   --  28.0* 27.0*  PLT  --  165  --   --  161 163  APTT 30  --   --   --   --   --   LABPROT 14.6  --   --   --   --   --   INR 1.15  --   --   --   --   --   HEPARINUNFRC  --   --    < > 0.43 0.38 0.30  CREATININE  --  3.93*  --   --  5.43* 6.41*   < > = values in this interval not displayed.    Estimated Creatinine Clearance: 10.7 mL/min (A) (by C-G formula based on SCr of 6.41 mg/dL (H)).   Medical History: Past Medical History:  Diagnosis Date  . Chronic kidney disease   . Depression   . Diabetes mellitus without complication ([place])   . Glaucoma   . Gout   . HLD (hyperlipidemia)   . Hypertension   . Prostate cancer ([place])   . Renal failure   . Stroke [facility])     Medications:  No anticoag in PTA meds  Assessment:  HEPARIN COURSE 4400 unit bolus and initial rate of 1100 units/hr. First heparin level 8 hours after start of infusion.  1/12 10:09 HL 036   1/12  1807 HL 0.43   Goal of Therapy:  Heparin level  0.3-0.7 units/ml Monitor platelets by anticoagulation protocol: Yes   Plan:  01/14 @ 0400 HL 0.30 therapeutic. Will continue current rate and will recheck in 8 hours to ensure level doesn't fall below goal--also did not increase as hgb is coming down. Will continue to monitor.  Tobie Lords, PharmD, BCPS Clinical Pharmacist  06/15/2018,4:51 AM

## 2018-06-15 NOTE — Progress Notes (Signed)
Pre HD assessment   06/15/18 0923  Vital Signs  Temp 98.2 F (36.8 C)  Temp Source Oral  Pulse Rate (!) 42  Pulse Rate Source Monitor  Resp (!) 30  BP (!) 130/55  BP Location Right Arm  BP Method Automatic  Patient Position (if appropriate) Lying  Oxygen Therapy  SpO2 98 %  O2 Device Room Air  Pain Assessment  Pain Scale 0-10  Pain Score 0  Dialysis Weight  Weight 68.9 kg  Type of Weight Pre-Dialysis  Time-Out for Hemodialysis  What Procedure? HD  Pt Identifiers(min of two) First/Last Name;MRN/Account#  Correct Site? Yes  Correct Side? Yes  Correct Procedure? Yes  Consents Verified? Yes  Rad Studies Available? N/A  Safety Precautions Reviewed? Yes  Research scientist (physical sciences)  (2A)  Station Number 1  UF/Alarm Test Passed  Conductivity: Meter 14.2  Conductivity: Machine  14.1  pH 7.4  Reverse Osmosis main  Normal Saline Lot Number [id number]  Dialyzer Lot Number [id number]  Disposable Set Lot Number [id number]  Machine Temperature 98.6 F (37 C)  Musician and Audible Yes  Blood Lines Intact and Secured Yes  Pre Treatment Patient Checks  Vascular access used during treatment Catheter  Hepatitis B Surface Antigen Results Negative  Date Hepatitis B Surface Antigen Drawn 03/22/18  Hepatitis B Surface Antibody  (>10)  Date Hepatitis B Surface Antibody Drawn 03/22/18  Hemodialysis Consent Verified Yes  Hemodialysis Standing Orders Initiated Yes  ECG (Telemetry) Monitor On Yes  Prime Ordered Normal Saline  Length of  DialysisTreatment -hour(s) 3.5 Hour(s)  Dialyzer Elisio 17H NR  Dialysate 2K, 2.5 Ca  Dialysis Anticoagulant None  Dialysate Flow Ordered 800  Blood Flow Rate Ordered 400 mL/min  Ultrafiltration Goal 2 Liters  Pre Treatment Labs Phosphorus;Other (Comment) (heparin level )  Dialysis Blood Pressure Support Ordered Normal Saline  Education / Care Plan  Dialysis Education Provided Yes  Documented Education in Care Plan Yes  Fistula / Graft  Left Forearm Arteriovenous fistula  Placement Date/Time: 06/09/18 1325   Placed prior to admission: No  Orientation: Left  Access Location: Forearm  Access Type: Arteriovenous fistula  Fistula / Graft Assessment Present;Thrill;Bruit  Hemodialysis Catheter Right Internal jugular  Placement Date/Time: 03/24/18 1658   Time Out: Correct patient;Correct site;Correct procedure  Maximum sterile barrier precautions: Hand hygiene;Sterile gloves;Cap;Large sterile sheet;Mask;Sterile gown  Site Prep: Chlorhexidine  Local Anesthetic: Inje...  Site Condition No complications  Blue Lumen Status Heparin locked  Red Lumen Status Heparin locked  Dressing Type Gauze/Drain sponge  Dressing Status Clean;Dry;Intact  Interventions Dressing changed  Drainage Description None  Dressing Change Due 06/15/18

## 2018-06-16 ENCOUNTER — Inpatient Hospital Stay: Payer: Medicare Other | Admitting: Anesthesiology

## 2018-06-16 ENCOUNTER — Encounter
Admission: EM | Disposition: A | Discharge status: Home Health | Payer: Self-pay | Source: Home / Self Care | Attending: Internal Medicine

## 2018-06-16 ENCOUNTER — Ambulatory Visit: Admission: RE | Admit: 2018-06-16 | Payer: Medicare Other | Source: Home / Self Care | Admitting: Vascular Surgery

## 2018-06-16 DIAGNOSIS — I70261 Atherosclerosis of native arteries of extremities with gangrene, right leg: Secondary | ICD-10-CM

## 2018-06-16 DIAGNOSIS — I7092 Chronic total occlusion of artery of the extremities: Secondary | ICD-10-CM

## 2018-06-16 HISTORY — PX: LOWER EXTREMITY ANGIOGRAPHY: CATH118251

## 2018-06-16 LAB — GLUCOSE, CAPILLARY
GLUCOSE-CAPILLARY: 92 mg/dL (ref 70–99)
Glucose-Capillary: 78 mg/dL (ref 70–99)
Glucose-Capillary: 86 mg/dL (ref 70–99)
Glucose-Capillary: 150 mg/dL — ABNORMAL HIGH (ref 70–99)
Glucose-Capillary: 83 mg/dL (ref 70–99)

## 2018-06-16 LAB — CBC
HEMATOCRIT: 28.1 % — AB (ref 39.0–52.0)
Hemoglobin: 9.1 g/dL — ABNORMAL LOW (ref 13.0–17.0)
MCH: 31.6 pg (ref 26.0–34.0)
MCHC: 32.4 g/dL (ref 30.0–36.0)
MCV: 97.6 fL (ref 80.0–100.0)
NRBC: 0 % (ref 0.0–0.2)
Platelets: 157 10*3/uL (ref 150–400)
RBC: 2.88 MIL/uL — ABNORMAL LOW (ref 4.22–5.81)
RDW: 14.9 % (ref 11.5–15.5)
WBC: 7 10*3/uL (ref 4.0–10.5)

## 2018-06-16 LAB — BASIC METABOLIC PANEL
Anion gap: 11 (ref 5–15)
BUN: 25 mg/dL — ABNORMAL HIGH (ref 8–23)
CO2: 29 mmol/L (ref 22–32)
Calcium: 8.9 mg/dL (ref 8.9–10.3)
Chloride: 99 mmol/L (ref 98–111)
Creatinine, Ser: 4.59 mg/dL — ABNORMAL HIGH (ref 0.61–1.24)
GFR calc non Af Amer: 12 mL/min — ABNORMAL LOW (ref >60)
GFR, EST AFRICAN AMERICAN: 14 mL/min — AB (ref >60)
Glucose, Bld: 90 mg/dL (ref 70–99)
Potassium: 4 mmol/L (ref 3.5–5.1)
Sodium: 139 mmol/L (ref 135–145)

## 2018-06-16 LAB — HEPARIN LEVEL (UNFRACTIONATED): Heparin Unfractionated: 0.4 IU/mL (ref 0.30–0.70)

## 2018-06-16 SURGERY — LOWER EXTREMITY ANGIOGRAPHY
Anesthesia: Moderate Sedation | Site: Leg Lower | Laterality: Right

## 2018-06-16 MED ORDER — DIPHENHYDRAMINE HCL 50 MG/ML IJ SOLN: 50.0000 mg | Freq: Once | INTRAMUSCULAR | Status: DC

## 2018-06-16 MED ORDER — HEPARIN (PORCINE) IN NACL 1000-0.9 UT/500ML-% IV SOLN
INTRAVENOUS | Status: AC
  Filled 2018-06-16: qty 1000

## 2018-06-16 MED ORDER — SODIUM CHLORIDE 0.9 % IV SOLN
INTRAVENOUS | Status: DC | PRN
  Administered 2018-06-16: 250 mL via INTRAVENOUS
  Administered 2018-06-16: 5 mL via INTRAVENOUS

## 2018-06-16 MED ORDER — METHYLPREDNISOLONE SODIUM SUCC 125 MG IJ SOLR: 125.0000 mg | INTRAMUSCULAR | Status: DC | PRN

## 2018-06-16 MED ORDER — MIDAZOLAM HCL 5 MG/5ML IJ SOLN
INTRAMUSCULAR | Status: AC
  Filled 2018-06-16: qty 10

## 2018-06-16 MED ORDER — HEPARIN SODIUM (PORCINE) 1000 UNIT/ML IJ SOLN
INTRAMUSCULAR | Status: AC
  Filled 2018-06-16: qty 1

## 2018-06-16 MED ORDER — CLOPIDOGREL BISULFATE 75 MG PO TABS
75.0000 mg | ORAL_TABLET | Freq: Every day | ORAL | Status: DC
  Administered 2018-06-16 – 2018-06-17 (×2): 75 mg via ORAL
  Filled 2018-06-16 (×2): qty 1

## 2018-06-16 MED ORDER — HEPARIN SODIUM (PORCINE) 1000 UNIT/ML IJ SOLN
INTRAMUSCULAR | Status: DC | PRN
  Administered 2018-06-16: 5000 [IU] via INTRAVENOUS

## 2018-06-16 MED ORDER — FENTANYL CITRATE (PF) 100 MCG/2ML IJ SOLN
INTRAMUSCULAR | Status: DC | PRN
  Administered 2018-06-16 (×2): 50 ug via INTRAVENOUS
  Administered 2018-06-16: 25 ug via INTRAVENOUS
  Administered 2018-06-16: 50 ug via INTRAVENOUS

## 2018-06-16 MED ORDER — MIDAZOLAM HCL 2 MG/2ML IJ SOLN
INTRAMUSCULAR | Status: DC | PRN
  Administered 2018-06-16: 1 mg via INTRAVENOUS
  Administered 2018-06-16: 2 mg via INTRAVENOUS
  Administered 2018-06-16 (×3): 1 mg via INTRAVENOUS

## 2018-06-16 MED ORDER — HYDROMORPHONE HCL 1 MG/ML IJ SOLN: 1.0000 mg | Freq: Once | INTRAMUSCULAR | Status: DC | PRN

## 2018-06-16 MED ORDER — ASPIRIN EC 81 MG PO TBEC
81.0000 mg | DELAYED_RELEASE_TABLET | Freq: Every day | ORAL | Status: DC
  Administered 2018-06-16 – 2018-06-17 (×2): 81 mg via ORAL
  Filled 2018-06-16 (×2): qty 1

## 2018-06-16 MED ORDER — LIDOCAINE-EPINEPHRINE (PF) 1 %-1:200000 IJ SOLN
INTRAMUSCULAR | Status: AC
  Filled 2018-06-16: qty 10

## 2018-06-16 MED ORDER — FENTANYL CITRATE (PF) 100 MCG/2ML IJ SOLN
INTRAMUSCULAR | Status: AC
  Filled 2018-06-16: qty 4

## 2018-06-16 MED ORDER — SODIUM CHLORIDE 0.9 % IV SOLN: INTRAVENOUS | Status: DC

## 2018-06-16 MED ORDER — FAMOTIDINE 20 MG PO TABS: 40.0000 mg | ORAL_TABLET | ORAL | Status: DC | PRN

## 2018-06-16 MED ORDER — ONDANSETRON HCL 4 MG/2ML IJ SOLN: 4.0000 mg | Freq: Four times a day (QID) | INTRAMUSCULAR | Status: DC | PRN

## 2018-06-16 MED ORDER — IOPAMIDOL (ISOVUE-300) INJECTION 61%
INTRAVENOUS | Status: DC | PRN
  Administered 2018-06-16: 80 mL via INTRAVENOUS

## 2018-06-16 SURGICAL SUPPLY — 31 items
BALLN LUTONIX 018 4X150X130 (BALLOONS) ×3
BALLN LUTONIX 018 6X220X130 (BALLOONS) ×3
BALLN LUTONIX 7X220X130 (BALLOONS) ×3
BALLN LUTONIX 7X80X130 (BALLOONS) ×3
BALLN LUTONIX DCB 6X80X130 (BALLOONS) ×3
BALLN ULTRVRSE 8X60X75C (BALLOONS) ×3
BALLOON LUTONIX 018 4X150X130 (BALLOONS) IMPLANT
BALLOON LUTONIX 018 6X220X130 (BALLOONS) IMPLANT
BALLOON LUTONIX 7X220X130 (BALLOONS) IMPLANT
BALLOON LUTONIX 7X80X130 (BALLOONS) IMPLANT
BALLOON LUTONIX DCB 6X80X130 (BALLOONS) IMPLANT
BALLOON ULTRVRSE 8X60X75C (BALLOONS) IMPLANT
CATH BEACON 5 .035 40 KMP TP (CATHETERS) IMPLANT
CATH BEACON 5 .035 65 RIM TIP (CATHETERS) ×2 IMPLANT
CATH BEACON 5 .038 100 VERT TP (CATHETERS) ×2 IMPLANT
CATH BEACON 5 .038 40 KMP TP (CATHETERS) ×2
CATH CXI SUPP ANG 4FR 135 (CATHETERS) IMPLANT
CATH CXI SUPP ANG 4FR 135CM (CATHETERS) ×3
CATH PIG 70CM (CATHETERS) ×2 IMPLANT
DEVICE PRESTO INFLATION (MISCELLANEOUS) ×2 IMPLANT
DEVICE STARCLOSE SE CLOSURE (Vascular Products) ×2 IMPLANT
GLIDEWIRE ADV .035X260CM (WIRE) ×4 IMPLANT
GUIDEWIRE STR TIP .014X300X8 (WIRE) ×2 IMPLANT
PACK ANGIOGRAPHY (CUSTOM PROCEDURE TRAY) ×3 IMPLANT
SHEATH BRITE TIP 5FRX11 (SHEATH) ×2 IMPLANT
SHEATH FLEXOR ANSEL2 7FRX45 (SHEATH) ×2 IMPLANT
STENT LIFESTAR 8X80X80 (Permanent Stent) ×2 IMPLANT
STENT LIFESTAR 9X60 (Permanent Stent) ×2 IMPLANT
TUBING CONTRAST HIGH PRESS 72 (TUBING) ×2 IMPLANT
WIRE G V18X300CM (WIRE) ×2 IMPLANT
WIRE J 3MM .035X145CM (WIRE) ×2 IMPLANT

## 2018-06-16 NOTE — Progress Notes (Signed)
[facility] [place] NAME: Jose [name]    MR#:  [id number]  DATE OF BIRTH:  14-Sep-1945  SUBJECTIVE:  CHIEF COMPLAINT: Patient waiting angiogram complains of some foot pain on the right REVIEW OF SYSTEMS:  CONSTITUTIONAL: No fever, fatigue or weakness.  EYES: No blurred or double vision.  EARS, NOSE, AND THROAT: No tinnitus or ear pain.  RESPIRATORY: No cough, shortness of breath, wheezing or hemoptysis.  CARDIOVASCULAR: No chest pain, orthopnea, edema.  GASTROINTESTINAL: No nausea, vomiting, diarrhea or abdominal pain.  GENITOURINARY: No dysuria, hematuria.  ENDOCRINE: No polyuria, nocturia,  HEMATOLOGY: No anemia, easy bruising or bleeding SKIN: No rash or lesion. MUSCULOSKELETAL: Right third toe with discoloration and swelling and pain NEUROLOGIC: No tingling, numbness, weakness.  PSYCHIATRY: No anxiety or depression.   DRUG ALLERGIES:   Allergies  Allergen Reactions  . Penicillin G Other (See Comments)    Other reaction(s): Unknown DID THE REACTION INVOLVE: Swelling of the face/tongue/throat, SOB, or low BP? Unknown Sudden or severe rash/hives, skin peeling, or the inside of the mouth or nose? Unknown Did it require medical treatment? Unknown When did it last happen?unknown If all above answers are "NO", may proceed with cephalosporin use.     VITALS:  Blood pressure 100/61, pulse (!) 52, temperature 98.8 F (37.1 C), temperature source Oral, resp. rate 13, height 6\' 1"  (1.854 m), weight 67.5 kg, SpO2 97 %.  PHYSICAL EXAMINATION:  GENERAL:  73 y.o.-year-old patient lying in the bed with no acute distress.  EYES: Pupils equal, round, reactive to light and accommodation. No scleral icterus. Extraocular muscles intact.  HEENT: Head atraumatic, normocephalic. Oropharynx and nasopharynx clear.  NECK:  Supple, no jugular venous distention. No thyroid enlargement, no tenderness.  LUNGS: Normal breath sounds bilaterally,  no wheezing, rales,rhonchi or crepitation. No use of accessory muscles of respiration.  CARDIOVASCULAR: S1, S2 normal. No murmurs, rubs, or gallops.  ABDOMEN: Soft, nontender, nondistended. Bowel sounds present. No organomegaly or mass.  EXTREMITIES: Right third toe ulceration with dark with dark discoloration NEUROLOGIC: Awake, alert and oriented x3 sensation intact. Gait not checked.  PSYCHIATRIC: The patient is alert and oriented x 3.  SKIN: No obvious rash, lesion, or ulcer.    LABORATORY PANEL:   CBC Recent Labs  Lab 06/16/18 0428  WBC 7.0  HGB 9.1*  HCT 28.1*  PLT 157   ------------------------------------------------------------------------------------------------------------------  Chemistries  Recent Labs  Lab 06/12/18 1641  06/16/18 0428  NA 139   < > 139  K 3.1*   < > 4.0  CL 98   < > 99  CO2 30   < > 29  GLUCOSE 84   < > 90  BUN 13   < > 25*  CREATININE 2.54*   < > 4.59*  CALCIUM 8.4*   < > 8.9  AST 25  --   --   ALT 15  --   --   ALKPHOS 40  --   --   BILITOT 0.3  --   --    < > = values in this interval not displayed.   ------------------------------------------------------------------------------------------------------------------  Cardiac Enzymes No results for input(s): TROPONINI in the last 168 hours. ------------------------------------------------------------------------------------------------------------------  RADIOLOGY:  No results found.  EKG:   Orders placed or performed during the hospital encounter of 06/12/18  . ED EKG  . 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 -Probably embolic phenomena  from underlying A. fib Continue IV antibiotics and pain management as needed  No osteomyelitis on XR. Seen by vascular surgery plan for an angiogram today  #Paroxysmal atrial fibrillation rate controlled continue heparin drip Will likely need Eliquis on discharge  #End-stage renal  disease on hemodialysis Follow-up with nephrology-for hemodialysis today  #Anemia of chronic kidney disease hemoglobin  stable Continue ESA with dialysis  #Essential hypertension-continue amlodipine, hydralazine and clonidine and titrate as needed    All the records are reviewed and case discussed with Care Management/Social Workerr. Management plans discussed with the patient, family and they are in agreement.  CODE STATUS: fc   TOTAL TIME TAKING CARE OF THIS PATIENT: 35 minutes.   POSSIBLE D/C IN  2-3 DAYS, DEPENDING ON CLINICAL CONDITION.  Note: This dictation was prepared with Dragon dictation along with smaller phrase technology. Any transcriptional errors that result from this process are unintentional.   Dustin Flock M.D on 06/16/2018 at 2:29 PM  Between 7am to 6pm - Pager - [phone] After 6pm go to [email or web address] - password [email or web address] [facility]  [place]  Office  [phone]  CC: Primary care physician; [name], MD

## 2018-06-16 NOTE — Progress Notes (Signed)
ANTICOAGULATION CONSULT NOTE   Pharmacy Consult for heparin drip Indication: PAD claudication rest pain  Patient Measurements: Height: 6\' 1"  (185.4 cm) Weight: 148 lb 13 oz (67.5 kg) IBW/kg (Calculated) : 79.9 Heparin Dosing Weight: 73 kg  Vital Signs: Temp: 98.4 F (36.9 C) (01/15 0528) Temp Source: Oral (01/15 0528) BP: 122/62 (01/15 0528) Pulse Rate: 54 (01/15 0528)  Labs: Recent Labs    06/14/18 0524 06/15/18 0357 06/15/18 0956 06/16/18 0428  HGB 9.1* 8.7*  --  9.1*  HCT 28.0* 27.0*  --  28.1*  PLT 161 163  --  157  HEPARINUNFRC 0.38 0.30 0.34 0.40  CREATININE 5.43* 6.41*  --  4.59*    Estimated Creatinine Clearance: 13.9 mL/min (A) (by C-G formula based on SCr of 4.59 mg/dL (H)).   Medical History: Past Medical History:  Diagnosis Date  . Chronic kidney disease   . Depression   . Diabetes mellitus without complication ([place])   . Glaucoma   . Gout   . HLD (hyperlipidemia)   . Hypertension   . Prostate cancer ([place])   . Renal failure   . Stroke [facility])     Medications:  No anticoag in PTA meds  HEPARIN COURSE 1/12 Initiation: 4400 unit bolus and initial rate of 1100 units/hr 1/12 1009 HL 0.36   1/12 1807 HL 0.43  1/13 0524 HL 0.38 1/13 0357 HL 0.30 1/13 0956 HL 0.34  Goal of Therapy:  Heparin level 0.3-0.7 units/ml Monitor platelets by anticoagulation protocol: Yes   Plan:  01/15 @ 0500 HL 0.40 therapeutic. Will continue current rate and will recheck HL w/ am labs. hgb stable will continue to monitor.  Tobie Lords, PharmD Clinical Pharmacist  06/16/2018,6:22 AM

## 2018-06-16 NOTE — Care Management Important Message (Signed)
Copy of signed Medicare IM left in patient's room (out for procedure).

## 2018-06-16 NOTE — Op Note (Signed)
[facility]  Percutaneous Study/Intervention Procedural Note   Date of Surgery: 06/16/2018  Surgeon(s):DEW,JASON    Assistants:none  Pre-operative Diagnosis: PAD with gangrene right foot  Post-operative diagnosis:  Same  Procedure(s) Performed:             1.  Ultrasound guidance for vascular access left femoral artery             2.  Catheter placement into right SFA from left femoral approach             3.  Aortogram and selective right lower extremity angiogram             4.  Percutaneous transluminal angioplasty of left external iliac artery with 6 mm diameter by 8 cm length Lutonix drug-coated angioplasty balloon             5.   Percutaneous transluminal angioplasty of the right tibioperoneal trunk and proximal posterior tibial artery 4 mm diameter by 15 cm length Lutonix drug-coated angioplasty balloon  6.  Percutaneous transluminal angioplasty of the right popliteal artery and SFA with 6 mm diameter by 22 cm length Lutonix drug-coated angioplasty balloon proximally and 7 mm diameter by 22 cm length Lutonix drug-coated angioplasty balloon distally  7.  Percutaneous transluminal angioplasty of the right external iliac artery with 7 mm diameter by 8 cm length Lutonix drug-coated angioplasty balloon  8.  Self-expanding stent placement to the right external iliac artery with 9 mm diameter by 6 cm length life star stent for greater than 50% residual stenosis after angioplasty  9.  Self-expanding stent placement to the left external iliac artery with 8 mm diameter by 8 cm length life star stent for greater than 50% residual stenosis after angioplasty             10.  StarClose closure device left femoral artery  EBL: 5 cc   Contrast: 80 cc  Fluoro Time: 10.3 minutes  Moderate Conscious Sedation Time: approximately 45 minutes using 6 mg of Versed and 175 Mcg of Fentanyl              Indications:  Patient is a 73 y.o.male with a gangrenous right third toe.  The patient has not had noninvasive studies, but clinically he was very high risk for disease and his disease process needed treated promptly. The patient is brought in for angiography for further evaluation and potential treatment.  Due to the limb threatening nature of the situation, angiogram was performed for attempted limb salvage. The patient is aware that if the procedure fails, amputation would be expected.  The patient also understands that even with successful revascularization, amputation may still be required due to the severity of the situation.  Risks and benefits are discussed and informed consent is obtained.   Procedure:  The patient was identified and appropriate procedural time out was performed.  The patient was then placed supine on the table and prepped and draped in the usual sterile fashion. Moderate conscious sedation was administered during a face to face encounter with the patient throughout the procedure with my supervision of the RN administering medicines and monitoring the patient's vital signs, pulse oximetry, telemetry and mental status throughout from the start of the procedure until the patient was taken to the recovery room. Ultrasound was used to evaluate the left common femoral artery.  It was patent .  A digital ultrasound image was acquired.  A Seldinger needle was used to access the left common  femoral artery under direct ultrasound guidance and a permanent image was performed.  A 0.035 J wire was advanced without resistance and a 5Fr sheath was placed.  Pigtail catheter was placed into the aorta and an AP aortogram was performed. This demonstrated sluggish renal artery flow, aorta is ectatic but not stenotic.  The common iliac arteries were ectatic and there was occlusion of the right hypogastric artery.  Both external iliac arteries had significant stenosis associated with some tortuosity.  On the right this was in the 75 to 80% range.  On the left it was in the 85 to 90%  range. I then crossed the aortic bifurcation and advanced to the right femoral head.  Due to his very sluggish circulation time and slow flow, the catheter had to be advanced to the proximal to mid right SFA to help opacify distally.  Selective right lower extremity angiogram was then performed. This demonstrated an area in the proximal SFA with about 60% stenosis.  The mid to distal SFA was somewhat diffusely diseased with stenosis in the 70 to 80% range.  The popliteal artery was somewhat large but there was a focal stenosis in the above-knee popliteal artery in the 80% range.  There was then a typical tibial trifurcation with a heavily diseased tibioperoneal trunk and proximal posterior tibial artery with about a 90% stenosis.  The anterior tibial artery had a long segment occlusion but the posterior tibial artery and peroneal arteries were patent distally beyond the proximal disease and were continuous. It was felt that it was in the patient's best interest to proceed with intervention after these images to avoid a second procedure and a larger amount of contrast and fluoroscopy based off of the findings from the initial angiogram. The patient was systemically heparinized and I started by performing angioplasty of the left external iliac artery with a 6 mm diameter by 8 cm length Lutonix drug-coated angioplasty balloon inflated to 14 atm for 1 minute.  Completion imaging still showed about a 60% residual stenosis and we would place a stent in this location before completing the procedure.  A 7 [place] Ansell sheath was then placed over the [employer] wire. I then used a Kumpe catheter and the advantage wire to navigate down through the SFA and popliteal lesions and down into the tibioperoneal trunk.  Using a V 18 wire and a CXI catheter I was able to cross the tibioperoneal trunk and proximal posterior tibial artery stenosis and parked the wire in the foot.  Intervention was then performed.  A 4 mm diameter  by 15 cm length Lutonix drug-coated angioplasty balloon was inflated to 10 atm for 1 minute and the tibioperoneal trunk and proximal posterior tibial artery.  Completion imaging showed about a 30% residual stenosis in this location with both the peroneal artery and posterior tibial arteries providing distal flow.  The popliteal artery and distal SFA were then treated with a 7 mm diameter by 22 cm length Lutonix drug-coated angioplasty balloon inflated to 10 atm for 1 minute.  The proximal and mid SFA were then treated with a 6 mm diameter by 22 cm length Lutonix drug-coated angioplasty balloon inflated to 12 atm for 1 minute.  Following this, there was only about 20 to 25% residual stenosis within the SFA and popliteal lesions.  The right external iliac artery was then addressed with pulling the sheath back to the common iliac artery.  A 7 mm diameter by 8 cm length Lutonix drug-coated angioplasty balloon was  then inflated to 12 atm for 1 minute.  Completion imaging showed greater than 50% residual stenosis so a 9 mm diameter by 6 cm length stent was deployed and postdilated with an 8 mm balloon with excellent angiographic completion result and less than 20% residual stenosis.  The left external iliac artery was then treated when the sheath was pulled back to the left external iliac system.  An 8 mm diameter by 8 cm length life star stent was then deployed in the left external iliac artery and postdilated with an 8 mm balloon with excellent angiographic completion result and only about 10 to 15% residual stenosis. I elected to terminate the procedure. The sheath was removed and StarClose closure device was deployed in the left femoral artery with excellent hemostatic result. The patient was taken to the recovery room in stable condition having tolerated the procedure well.  Findings:               Aortogram:  sluggish renal artery flow, aorta is ectatic but not stenotic.  The common iliac arteries were ectatic  and there was occlusion of the right hypogastric artery.  Both external iliac arteries had significant stenosis associated with some tortuosity.  On the right this was in the 75 to 80% range.  On the left it was in the 85 to 90% range.             Right lower Extremity:   proximal SFA with about 60% stenosis.  The mid to distal SFA was somewhat diffusely diseased with stenosis in the 70 to 80% range.  The popliteal artery was somewhat large but there was a focal stenosis in the above-knee popliteal artery in the 80% range.  There was then a typical tibial trifurcation with a heavily diseased tibioperoneal trunk and proximal posterior tibial artery with about a 90% stenosis.  The anterior tibial artery had a long segment occlusion but the posterior tibial artery and peroneal arteries were patent distally beyond the proximal disease and were continuous   Disposition: Patient was taken to the recovery room in stable condition having tolerated the procedure well.  Complications: None  Leotis Pain 06/16/2018 1:02 PM   This note was created with Dragon Medical transcription system. Any errors in dictation are purely unintentional.

## 2018-06-16 NOTE — Progress Notes (Signed)
Pharmacy Antibiotic Note  Jose [name] is a 73 y.o. male admitted on 06/12/2018 with right toe gangrene.  Pharmacy has been consulted for Zosyn dosing. The patient has undergone a successful angiogram by vascular surgery today. This is day number 4 of Zosyn. He remains afebrile and without leukocytosis.  Plan: Continue Zosyn 3.375g IV q12h (4 hour infusion).  Height: 6\' 1"  (185.4 cm) Weight: 148 lb 13 oz (67.5 kg) IBW/kg (Calculated) : 79.9  Temp (24hrs), Avg:98.4 F (36.9 C), Min:97.8 F (36.6 C), Max:98.8 F (37.1 C)  Recent Labs  Lab 06/12/18 1641 06/12/18 1648 06/13/18 0501 06/14/18 0524 06/15/18 0357 06/16/18 0428  WBC 8.5  --  6.9 7.3 7.1 7.0  CREATININE 2.54*  --  3.93* 5.43* 6.41* 4.59*  LATICACIDVEN  --  1.04  --   --   --   --     Estimated Creatinine Clearance: 13.9 mL/min (A) (by C-G formula based on SCr of 4.59 mg/dL (H)).    Antimicrobials this admission: Zosyn 1/12  >>   Microbiology results: 1/12 BCx: pending 1/12 MRSA PCR (-)  Thank you for allowing pharmacy to be a part of this patient's care.  Dallie Piles, PharmD 06/16/2018 2:35 PM

## 2018-06-16 NOTE — H&P (Signed)
[facility] History & Physical Update  The patient was interviewed and re-examined.  The patient's previous History and Physical has been reviewed and is unchanged.  There is no change in the plan of care. We plan to proceed with the scheduled procedure.  Leotis Pain, MD  06/16/2018, 11:55 AM

## 2018-06-16 NOTE — Progress Notes (Signed)
ANTICOAGULATION CONSULT NOTE   Pharmacy Consult for heparin drip Indication: PAD claudication rest pain  Patient Measurements: Height: 6\' 1"  (185.4 cm) Weight: 148 lb 13 oz (67.5 kg) IBW/kg (Calculated) : 79.9 Heparin Dosing Weight: 73 kg  Vital Signs: Temp: 98.8 F (37.1 C) (01/15 1415) Temp Source: Oral (01/15 1415) BP: 100/61 (01/15 1415) Pulse Rate: 52 (01/15 1415)  Labs: Recent Labs    06/14/18 0524 06/15/18 0357 06/15/18 0956 06/16/18 0428  HGB 9.1* 8.7*  --  9.1*  HCT 28.0* 27.0*  --  28.1*  PLT 161 163  --  157  HEPARINUNFRC 0.38 0.30 0.34 0.40  CREATININE 5.43* 6.41*  --  4.59*    Estimated Creatinine Clearance: 13.9 mL/min (A) (by C-G formula based on SCr of 4.59 mg/dL (H)).   Medical History: Past Medical History:  Diagnosis Date  . Chronic kidney disease   . Depression   . Diabetes mellitus without complication ([place])   . Glaucoma   . Gout   . HLD (hyperlipidemia)   . Hypertension   . Prostate cancer ([place])   . Renal failure   . Stroke [facility])     Medications:  No anticoag in PTA meds  HEPARIN COURSE 1/12 Initiation: 4400 unit bolus and initial rate of 1100 units/hr 1/12 1009 HL 0.36   1/12 1807 HL 0.43  1/13 0524 HL 0.38 1/13 0357 HL 0.30 1/13 0956 HL 0.34 1/14 0428 HL 0.40 1/14 0900 turned off for angiogram 1/14 1500 restarted after procedure  Goal of Therapy:  Heparin level 0.3-0.7 units/ml Monitor platelets by anticoagulation protocol: Yes   Plan:  He has been consistently therapeutic at 1100 units/hr therefore we will continue heparin at that rate and will recheck HL in 8 hours. Hgb stable, will continue to monitor.  Dallie Piles, PharmD Clinical Pharmacist  06/16/2018,2:23 PM

## 2018-06-17 ENCOUNTER — Encounter: Payer: Self-pay | Admitting: Vascular Surgery

## 2018-06-17 LAB — GLUCOSE, CAPILLARY
Glucose-Capillary: 91 mg/dL (ref 70–99)
Glucose-Capillary: 88 mg/dL (ref 70–99)
Glucose-Capillary: 103 mg/dL — ABNORMAL HIGH (ref 70–99)

## 2018-06-17 LAB — RENAL FUNCTION PANEL
Albumin: 3 g/dL — ABNORMAL LOW (ref 3.5–5.0)
Anion gap: 12 (ref 5–15)
BUN: 40 mg/dL — ABNORMAL HIGH (ref 8–23)
CALCIUM: 8.7 mg/dL — AB (ref 8.9–10.3)
CO2: 27 mmol/L (ref 22–32)
Chloride: 98 mmol/L (ref 98–111)
Creatinine, Ser: 6.64 mg/dL — ABNORMAL HIGH (ref 0.61–1.24)
GFR calc Af Amer: 9 mL/min — ABNORMAL LOW (ref >60)
GFR calc non Af Amer: 8 mL/min — ABNORMAL LOW (ref >60)
Glucose, Bld: 89 mg/dL (ref 70–99)
Phosphorus: 6.1 mg/dL — ABNORMAL HIGH (ref 2.5–4.6)
Potassium: 4.2 mmol/L (ref 3.5–5.1)
Sodium: 137 mmol/L (ref 135–145)

## 2018-06-17 LAB — PHOSPHORUS: Phosphorus: 5.7 mg/dL — ABNORMAL HIGH (ref 2.5–4.6)

## 2018-06-17 MED ORDER — CEFUROXIME AXETIL 500 MG PO TABS: 500.0000 mg | ORAL_TABLET | Freq: Two times a day (BID) | ORAL | 0 refills | Status: AC

## 2018-06-17 MED ORDER — ASPIRIN 81 MG PO TBEC: 81.0000 mg | DELAYED_RELEASE_TABLET | Freq: Every day | ORAL | Status: DC

## 2018-06-17 MED ORDER — CLOPIDOGREL BISULFATE 75 MG PO TABS: 75.0000 mg | ORAL_TABLET | Freq: Every day | ORAL | 2 refills | Status: DC

## 2018-06-17 NOTE — Progress Notes (Signed)
[facility], [place] 06/17/18  Subjective:  Patient seen and evaluated during hemodialysis. Tolerating well.   Objective:  Vital signs in last 24 hours:  Temp:  [97.5 F (36.4 C)-98.8 F (37.1 C)] 98.2 F (36.8 C) (01/16 1236) Pulse Rate:  [30-138] 69 (01/16 1236) Resp:  [0-23] 18 (01/16 1236) BP: (100-196)/(53-138) 139/71 (01/16 1236) SpO2:  [93 %-100 %] 98 % (01/16 1236) Weight:  [69.5 kg] 69.5 kg (01/16 0833)  Weight change: -1.4 kg Filed Weights   06/15/18 1316 06/16/18 1019 06/17/18 0833  Weight: 67.5 kg 67.5 kg 69.5 kg    Intake/Output:    Intake/Output Summary (Last 24 hours) at 06/17/2018 1254 Last data filed at 06/17/2018 0620 Gross per 24 hour  Intake 62.42 ml  Output 0 ml  Net 62.42 ml     Physical Exam: General:  No acute distress, laying in the bed  HEENT  moist mucous membranes  Neck  supple  Pulm/lungs  normal breathing effort on room air, CTAB  CVS/Heart  irregular rhythm  Abdomen:   Soft, nontender  Extremities:  No edema, right foot third toe with necrotic tip  Neurologic:  Alert, able to answer questions appropriately  Skin:  No acute rashes  Access:  IJ PermCath, left upper extremity AV fistula developing       Basic Metabolic Panel:  Recent Labs  Lab 06/13/18 0501 06/14/18 0524 06/15/18 0357 06/15/18 0956 06/16/18 0428 06/17/18 0553  NA 141 140 138  --  139 137  K 3.2* 3.9 4.2  --  4.0 4.2  CL 100 102 102  --  99 98  CO2 31 27 24   --  29 27  GLUCOSE 143* 91 85  --  90 89  BUN 25* 36* 47*  --  25* 40*  CREATININE 3.93* 5.43* 6.41*  --  4.59* 6.64*  CALCIUM 8.7* 9.1 8.9  --  8.9 8.7*  PHOS  --   --   --  4.9*  --  5.7*  6.1*     CBC: Recent Labs  Lab 06/12/18 1641 06/13/18 0501 06/14/18 0524 06/15/18 0357 06/16/18 0428  WBC 8.5 6.9 7.3 7.1 7.0  NEUTROABS 4.5  --   --   --   --   HGB 10.6* 9.5* 9.1* 8.7* 9.1*  HCT 32.0* 29.7* 28.0* 27.0* 28.1*  MCV 97.3 96.7 96.9 98.2 97.6  PLT 184 165  161 163 157      Lab Results  Component Value Date   HEPBSAG Negative 03/22/2018   HEPBSAB Reactive 03/22/2018   HEPBIGM Negative 03/22/2018      Microbiology:  Recent Results (from the past 240 hour(s))  Blood Culture (routine x 2)     Status: None (Preliminary result)   Collection Time: 06/13/18 12:32 AM  Result Value Ref Range Status   Specimen Description BLOOD RIGHT FOREARM  Final   Special Requests   Final    BOTTLES DRAWN AEROBIC ONLY Blood Culture adequate volume   Culture   Final    NO GROWTH 4 DAYS Performed at [facility], [address] [place], [place] [address]    Report Status PENDING  Incomplete  Blood Culture (routine x 2)     Status: None (Preliminary result)   Collection Time: 06/13/18 12:32 AM  Result Value Ref Range Status   Specimen Description BLOOD RIGHT FOREARM UPPER  Final   Special Requests   Final    BOTTLES DRAWN AEROBIC AND ANAEROBIC Blood Culture results may  not be optimal due to an excessive volume of blood received in culture bottles   Culture   Final    NO GROWTH 4 DAYS Performed at [facility], [address] [place], [place] [address]    Report Status PENDING  Incomplete  MRSA PCR Screening     Status: None   Collection Time: 06/13/18  7:51 AM  Result Value Ref Range Status   MRSA by PCR NEGATIVE NEGATIVE Final    Comment:        The GeneXpert MRSA Assay (FDA approved for NASAL specimens only), is one component of a comprehensive MRSA colonization surveillance program. It is not intended to diagnose MRSA infection nor to guide or monitor treatment for MRSA infections. Performed at [facility], [address] [place], [place] [address]     Coagulation Studies: No results for input(s): LABPROT, INR in the last 72 hours.  Urinalysis: No results for input(s): COLORURINE, LABSPEC, PHURINE, GLUCOSEU, HGBUR, BILIRUBINUR, KETONESUR, PROTEINUR, UROBILINOGEN, NITRITE, LEUKOCYTESUR in the last 72  hours.  Invalid input(s): APPERANCEUR    Imaging: No results found.   Medications:   . sodium chloride Stopped (06/17/18 0220)  . piperacillin-tazobactam (ZOSYN)  IV Stopped (06/17/18 0143)   . amLODipine  10 mg Oral Daily  . aspirin EC  81 mg Oral Daily  . cloNIDine  0.2 mg Oral BID  . clopidogrel  75 mg Oral Daily  . epoetin (EPOGEN/PROCRIT) injection  10,000 Units Intravenous Q T,Th,Sa-HD  . escitalopram  10 mg Oral Daily  . hydrALAZINE  25 mg Oral Q8H  . insulin aspart  0-9 Units Subcutaneous TID WC   sodium chloride, acetaminophen **OR** acetaminophen, HYDROmorphone (DILAUDID) injection, morphine injection, ondansetron **OR** ondansetron (ZOFRAN) IV, oxyCODONE  Assessment/ Plan:  73 y.o.African American male with esrd, hypertension, gout, depression, diabetes mellitus type II, history of prostate cancer, Atrial fibrillation who was admitted to [facility] for rt foot gangrene  [facility]/TTS/  End-stage renal disease -patient seen during dialysis treatment and tolerating well.  Continue dialysis on TTS schedule.  Secondary hyperparathyroidism Phosphorus currently 5.7.  Continue to monitor.  Anemia of chronic kidney disease -hemoglobin 9.1 at last check.  Maintain the patient on Epogen 10,000 units IV with dialysis.  Right foot third toe ulceration and pain -Angiogram with PTCA performed in both bilateral lower extremities.   LOS: 4 Sharion Grieves 1/16/202012:54 PM  [place], [place]  Note: This note was prepared with Dragon dictation. Any transcription errors are unintentional

## 2018-06-17 NOTE — Progress Notes (Signed)
Post HD Assessment    06/17/18 1210  Neurological  Level of Consciousness Alert  Orientation Level Oriented X4  Respiratory  Respiratory Pattern Regular  Chest Assessment Chest expansion symmetrical  Bilateral Breath Sounds Clear;Diminished  Cough None  Cardiac  Pulse Irregular  ECG Monitor Yes  Cardiac Rhythm Heart block  Vascular  R Radial Pulse +2  L Radial Pulse +2  Edema Generalized  Psychosocial  Psychosocial (WDL) WDL

## 2018-06-17 NOTE — Progress Notes (Signed)
HD Tx completed tolerated well.   06/17/18 1203  Vital Signs  Pulse Rate 80  Pulse Rate Source Monitor  Resp 17  BP (!) 154/63  BP Location Right Arm  BP Method Automatic  Patient Position (if appropriate) Lying  Oxygen Therapy  SpO2 99 %  O2 Device Room Air  During Hemodialysis Assessment  HD Safety Checks Performed Yes  KECN 67.4 KECN  Dialysis Fluid Bolus Normal Saline  Bolus Amount (mL) 250 mL  Intra-Hemodialysis Comments Tx completed;Tolerated well

## 2018-06-17 NOTE — Progress Notes (Signed)
Pre HD Assessment    06/17/18 0815  Neurological  Level of Consciousness Alert  Orientation Level Oriented X4  Respiratory  Respiratory Pattern Regular  Chest Assessment Chest expansion symmetrical  Bilateral Breath Sounds Clear;Diminished  Cough None  Cardiac  Pulse Irregular  ECG Monitor Yes  Cardiac Rhythm Heart block  Vascular  R Radial Pulse +2  L Radial Pulse +2  Edema Generalized  Psychosocial  Psychosocial (WDL) WDL

## 2018-06-17 NOTE — Care Management (Signed)
[name] dialysis liaison notified of discharge  

## 2018-06-17 NOTE — Progress Notes (Signed)
HD TX started w/o complication   43/56/86 1683  Vital Signs  Temp 98.2 F (36.8 C)  Temp Source Oral  Pulse Rate 60  Pulse Rate Source Monitor  Resp 18  BP 135/65  BP Location Right Arm  BP Method Automatic  Patient Position (if appropriate) Lying  Oxygen Therapy  SpO2 99 %  O2 Device Room Air  Pulse Oximetry Type Continuous  Pain Assessment  Pain Scale 0-10  Pain Score 0  Dialysis Weight  Weight 69.5 kg  Type of Weight Pre-Dialysis  Time-Out for Hemodialysis  What Procedure? HD  Pt Identifiers(min of two) First/Last Name;MRN/Account#  Correct Site? Yes  Correct Side? Yes  Correct Procedure? Yes  Consents Verified? Yes  Rad Studies Available? N/A  Safety Precautions Reviewed? Yes  Research scientist (physical sciences)  (2A)  Station Number 1  UF/Alarm Test Passed  Conductivity: Meter 14  Conductivity: Machine  14.1  pH 7.4  Reverse Osmosis Main  Normal Saline Lot Number [id number]  Dialyzer Lot Number [id number]  Disposable Set Lot Number [id number]  Machine Temperature 98.6 F (37 C)  Musician and Audible Yes  Blood Lines Intact and Secured Yes  Pre Treatment Patient Checks  Vascular access used during treatment Catheter  Hepatitis B Surface Antigen Results Negative  Date Hepatitis B Surface Antigen Drawn 03/22/18  Hepatitis B Surface Antibody  (>10)  Date Hepatitis B Surface Antibody Drawn 03/22/18  Hemodialysis Consent Verified Yes  Hemodialysis Standing Orders Initiated Yes  ECG (Telemetry) Monitor On Yes  Prime Ordered Normal Saline  Length of  DialysisTreatment -hour(s) 3.5 Hour(s)  Dialysis Treatment Comments Na 140  Dialyzer Elisio 17H NR  Dialysis Anticoagulant None  Dialysate Flow Ordered 800  Blood Flow Rate Ordered 400 mL/min  Ultrafiltration Goal 1.5 Liters  Pre Treatment Labs Phosphorus  Dialysis Blood Pressure Support Ordered Normal Saline  During Hemodialysis Assessment  Blood Flow Rate (mL/min) 400 mL/min  Arterial Pressure (mmHg) -200  mmHg  Venous Pressure (mmHg) 130 mmHg  Transmembrane Pressure (mmHg) 60 mmHg  Ultrafiltration Rate (mL/min) 670 mL/min  Dialysate Flow Rate (mL/min) 800 ml/min  Conductivity: Machine  14.1  HD Safety Checks Performed Yes  Dialysis Fluid Bolus Normal Saline  Bolus Amount (mL) 250 mL  Intra-Hemodialysis Comments Tx initiated  Education / Care Plan  Dialysis Education Provided Yes  Documented Education in Care Plan Yes  Fistula / Graft Left Forearm Arteriovenous fistula  Placement Date/Time: 06/09/18 1325   Placed prior to admission: No  Orientation: Left  Access Location: Forearm  Access Type: Arteriovenous fistula  Site Condition No complications  Fistula / Graft Assessment Present;Thrill  Hemodialysis Catheter Right Internal jugular  Placement Date/Time: 03/24/18 1658   Time Out: Correct patient;Correct site;Correct procedure  Maximum sterile barrier precautions: Hand hygiene;Sterile gloves;Cap;Large sterile sheet;Mask;Sterile gown  Site Prep: Chlorhexidine  Local Anesthetic: Inje...  Site Condition No complications  Dressing Type Biopatch;Occlusive  Dressing Status Clean;Dry;Intact  Dressing Change Due 06/25/18

## 2018-06-17 NOTE — Progress Notes (Signed)
Post HD Tx    06/17/18 1206  Hand-Off documentation  Report given to (Full Name) Pamella Pert, RN   Report received from (Full Name) Beatris Ship, RN   Vital Signs  Temp 98.1 F (36.7 C)  Temp Source Oral  Pulse Rate 66  Pulse Rate Source Monitor  Resp 18  BP (!) 196/56  BP Location Right Arm  BP Method Automatic  Patient Position (if appropriate) Lying  Oxygen Therapy  SpO2 99 %  O2 Device Room Air  Pulse Oximetry Type Continuous  Dialysis Weight  Weight 68.3 kg  Type of Weight Post-Dialysis  Post-Hemodialysis Assessment  Rinseback Volume (mL) 250 mL  KECN 67.4 V  Dialyzer Clearance Lightly streaked  Duration of HD Treatment -hour(s) 3.5 hour(s)  Hemodialysis Intake (mL) 500 mL  UF Total -Machine (mL) 2010 mL  Net UF (mL) 1510 mL  Tolerated HD Treatment Yes  Hemodialysis Catheter Right Internal jugular  Placement Date/Time: 03/24/18 1658   Time Out: Correct patient;Correct site;Correct procedure  Maximum sterile barrier precautions: Hand hygiene;Sterile gloves;Cap;Large sterile sheet;Mask;Sterile gown  Site Prep: Chlorhexidine  Local Anesthetic: Inje...  Post treatment catheter status Capped and Clamped

## 2018-06-17 NOTE — Care Management Note (Signed)
Case Management Note  Patient Details  Name: Jose [name] MRN: [id number] Date of Birth: Apr 14, 1946   Patient to discharge today.  Resumption orders for home health RN placed.  [name] with [facility] notified of discharge.   Subjective/Objective:                    Action/Plan:   Expected Discharge Date:  06/17/18               Expected Discharge Plan:  [place]  In-House Referral:     Discharge planning Services  CM Consult  Post Acute Care Choice:  Resumption of Svcs/PTA Provider Choice offered to:     DME Arranged:    DME Agency:     HH Arranged:  RN [place] Agency:  [facility]  Status of Service:  Completed, signed off  If discussed at [place] of Stay Meetings, dates discussed:    Additional Comments:  [name], RN 06/17/2018, 3:25 PM

## 2018-06-17 NOTE — Discharge Summary (Signed)
[facility] [place] [facility], 73 y.o., DOB [birth year], MRN [id number]. 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 male who presents with chief complaint as above.  Patient presents to the ED for evaluation of right toe ulceration and discoloration.  He was at dialysis and was recommended by dialysis nurse that he come to the ED for evaluation of the same.  Patient was noticed to have discoloration of the right third toe.  He underwent x-ray which showed no evidence of osteomyelitis.  She was started on antibiotics and was seen by vascular surgery.  Patient underwent angiogram and had percutaneous trans-luminal angioplasty with stent placement.  Patient is started on aspirin and Plavix by vascular surgery.  Patient did have paroxysmal atrial fibrillation his right toe discoloration is felt to be due to peripheral vascular disease and not embolic phenomena.  Patient will need outpatient follow-up with podiatry to see if there is any need for toe amputation.          Consults  vascular surgery   Significant Tests:  See full reports for all details    Dg Chest 2 View  Result Date: 06/12/2018 CLINICAL DATA:  Right arm swelling after central line placement. EXAM: CHEST - 2 VIEW COMPARISON:  03/21/2018 FINDINGS: Right jugular dual-lumen central venous dialysis catheter is seen in appropriate position. No evidence of pneumothorax or pleural effusion. Heart size is at the upper limits of normal.  Tortuosity of thoracic aorta is stable. Both lungs are well aerated and clear. IMPRESSION: Right jugular dual-lumen central venous dialysis catheter in appropriate position. No evidence of pneumothorax. No active lung disease. Electronically Signed   By: Earle Gell M.D.   On: 06/12/2018 17:42   Dg Foot Complete Right  Result Date: 06/13/2018 CLINICAL DATA:  Infection to the right third toe. Diabetes. EXAM: RIGHT FOOT COMPLETE - 3+ VIEW COMPARISON:  05/26/2018 FINDINGS: Diffuse bone demineralization. Mild hallux valgus deformity. No evidence of acute fracture or dislocation. No focal bone lesion or bone destruction. Soft tissue gas underneath the nail bed of the third toe suggesting soft tissue infection. No underlying bone changes to suggest osteomyelitis. IMPRESSION: No acute bony abnormalities. Soft tissue gas under the third toenail suggesting soft tissue infection. No changes to suggest osteomyelitis. Electronically Signed   By: Lucienne Capers M.D.   On: 06/13/2018 00:15   Dg Foot Complete Right  Result Date: 05/26/2018 CLINICAL DATA:  Pain in right first and second toes for 1 week. No injury. History of diabetes. EXAM: RIGHT FOOT COMPLETE - 3+ VIEW COMPARISON:  None. FINDINGS: Osteopenia. Mild hallux valgus deformity with secondary osteoarthritis of the first metatarsophalangeal joint. No typical findings of gouty arthritis. Suspect mild soft tissue swelling about the forefoot. 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 No acute osseous abnormality. Electronically Signed   By: Abigail Miyamoto M.D.   On: 05/26/2018 10:40       Today   Subjective:   Cordaro Mukai is any complaints  Objective:   Blood pressure 139/71,  pulse 69, temperature 98.2 F (36.8 C), temperature source Oral, resp. rate 18, height 6\' 1"  (1.854 m), weight 68.3 kg, SpO2 98 %.  .  Intake/Output Summary (Last 24 hours) at 06/17/2018 1329 Last data filed at  06/17/2018 1206 Gross per 24 hour  Intake 62.42 ml  Output 1510 ml  Net -1447.58 ml    Exam VITAL SIGNS: Blood pressure 139/71, pulse 69, temperature 98.2 F (36.8 C), temperature source Oral, resp. rate 18, height 6\' 1"  (1.854 m), weight 68.3 kg, SpO2 98 %.  GENERAL:  73 y.o.-year-old patient lying in the bed with no acute distress.  EYES: Pupils equal, round, reactive to light and accommodation. No scleral icterus. Extraocular muscles intact.  HEENT: Head atraumatic, normocephalic. Oropharynx and nasopharynx clear.  NECK:  Supple, no jugular venous distention. No thyroid enlargement, no tenderness.  LUNGS: Normal breath sounds bilaterally, no wheezing, rales,rhonchi or crepitation. No use of accessory muscles of respiration.  CARDIOVASCULAR: S1, S2 normal. No murmurs, rubs, or gallops.  ABDOMEN: Soft, nontender, nondistended. Bowel sounds present. No organomegaly or mass.  EXTREMITIES: Right third toe coloration with a small ulcer NEUROLOGIC: Cranial nerves II through XII are intact. Muscle strength 5/5 in all extremities. Sensation intact. Gait not checked.  PSYCHIATRIC: The patient is alert and oriented x 3.  SKIN: No obvious rash, lesion, or ulcer.   Data Review     CBC w Diff:  Lab Results  Component Value Date   WBC 7.0 06/16/2018   HGB 9.1 (L) 06/16/2018   HGB 13.6 08/18/2011   HCT 28.1 (L) 06/16/2018   PLT 157 06/16/2018   LYMPHOPCT 27 06/12/2018   MONOPCT 10 06/12/2018   EOSPCT 9 06/12/2018   BASOPCT 1 06/12/2018   CMP:  Lab Results  Component Value Date   NA 137 06/17/2018   NA 141 08/18/2011   K 4.2 06/17/2018   K 4.5 08/18/2011   CL 98 06/17/2018   CL 108 (H) 08/18/2011   CO2 27 06/17/2018   CO2 23 08/18/2011   BUN 40 (H) 06/17/2018   BUN 25 (H) 08/18/2011   CREATININE 6.64 (H) 06/17/2018   CREATININE 2.10 (H) 08/18/2011   PROT 7.6 06/12/2018   ALBUMIN 3.0 (L) 06/17/2018   BILITOT 0.3 06/12/2018   ALKPHOS 40 06/12/2018   AST 25 06/12/2018   ALT  15 06/12/2018  .  Micro Results Recent Results (from the past 240 hour(s))  Blood Culture (routine x 2)     Status: None (Preliminary result)   Collection Time: 06/13/18 12:32 AM  Result Value Ref Range Status   Specimen Description BLOOD RIGHT FOREARM  Final   Special Requests   Final    BOTTLES DRAWN AEROBIC ONLY Blood Culture adequate volume   Culture   Final    NO GROWTH 4 DAYS Performed at [facility], [address] [place], [place] [address]    Report Status PENDING  Incomplete  Blood Culture (routine x 2)     Status: None (Preliminary result)   Collection Time: 06/13/18 12:32 AM  Result Value Ref Range Status   Specimen Description BLOOD RIGHT FOREARM UPPER  Final   Special Requests   Final    BOTTLES DRAWN AEROBIC AND ANAEROBIC Blood Culture results may not be optimal due to an excessive volume of blood received in culture bottles   Culture   Final    NO GROWTH 4 DAYS Performed at [facility], [address] [place], [place] [address]    Report  Status PENDING  Incomplete  MRSA PCR Screening     Status: None   Collection Time: 06/13/18  7:51 AM  Result Value Ref Range Status   MRSA by PCR NEGATIVE NEGATIVE Final    Comment:        The GeneXpert MRSA Assay (FDA approved for NASAL specimens only), is one component of a comprehensive MRSA colonization surveillance program. It is not intended to diagnose MRSA infection nor to guide or monitor treatment for MRSA infections. Performed at [facility], [address], [place], [place] [address]         Code Status Orders  (From admission, onward)         Start     Ordered   06/13/18 0136  Full code  Continuous     06/13/18 0135        Code Status History    Date Active Date Inactive Code Status Order ID Comments User Context   03/21/2018 1315 03/28/2018 1004 Full Code [id number]  Saundra Shelling, MD ED          Follow-up Information    [name], MD Follow up  in 6 day(s).   Specialty:  Family Medicine Contact information: [place] [address] [phone]        Sharlotte Alamo, DPM Follow up in 1 week(s).   Specialty:  Podiatry Why:  toe gangrene Contact information: [place] [address] [phone]        Algernon Huxley, MD In 1 week.   Specialties:  Vascular Surgery, Radiology, Interventional Cardiology Contact information: [place] [address] [phone]           Discharge Medications   Allergies as of 06/17/2018      Reactions   Penicillin G Other (See Comments)   Other reaction(s): Unknown DID THE REACTION INVOLVE: Swelling of the face/tongue/throat, SOB, or low BP? Unknown Sudden or severe rash/hives, skin peeling, or the inside of the mouth or nose? Unknown Did it require medical treatment? Unknown When did it last happen?unknown If all above answers are "NO", may proceed with cephalosporin use.      Medication List    TAKE these medications   allopurinol 100 MG tablet Commonly known as:  ZYLOPRIM Take 100 mg by mouth daily.   amLODipine 10 MG tablet Commonly known as:  NORVASC Take 10 mg by mouth daily.   aspirin 81 MG EC tablet Take 1 tablet (81 mg total) by mouth daily. Start taking on:  June 18, 2018 What changed:    medication strength  how much to take   cefUROXime 500 MG tablet Commonly known as:  CEFTIN Take 1 tablet (500 mg total) by mouth 2 (two) times daily for 5 days.   cloNIDine 0.2 MG tablet Commonly known as:  CATAPRES TAKE 1 TABLET BY MOUTH TWICE A DAY   clopidogrel 75 MG tablet Commonly known as:  PLAVIX Take 1 tablet (75 mg total) by mouth daily. Start taking on:  June 18, 2018   feeding supplement (NEPRO CARB STEADY) Liqd Take 237 mLs by mouth 2 (two) times daily between meals.   hydrALAZINE 25 MG tablet Commonly known as:  APRESOLINE Take 1 tablet (25 mg total) by mouth every 8 (eight) hours.    HYDROcodone-acetaminophen 5-325 MG tablet Commonly known as:  NORCO Take 1 tablet by mouth every 6 (six) hours as needed for moderate pain.   LEXAPRO 10 MG tablet Generic drug:  escitalopram Take 10 mg by mouth daily.   multivitamin Tabs tablet Take 1 tablet by mouth at bedtime.          Total Time in preparing paper work, data evaluation and todays exam - 15 minutes  Dustin Flock M.D on 06/17/2018 at [place]  [phone]

## 2018-06-18 ENCOUNTER — Other Ambulatory Visit (INDEPENDENT_AMBULATORY_CARE_PROVIDER_SITE_OTHER): Payer: Self-pay | Admitting: Vascular Surgery

## 2018-06-18 LAB — CULTURE, BLOOD (ROUTINE X 2)
CULTURE: NO GROWTH
Culture: NO GROWTH
Special Requests: ADEQUATE

## 2018-06-25 ENCOUNTER — Ambulatory Visit (INDEPENDENT_AMBULATORY_CARE_PROVIDER_SITE_OTHER): Payer: Medicare Other | Admitting: Vascular Surgery

## 2018-07-06 ENCOUNTER — Telehealth (INDEPENDENT_AMBULATORY_CARE_PROVIDER_SITE_OTHER): Payer: Self-pay | Admitting: Vascular Surgery

## 2018-07-06 NOTE — Telephone Encounter (Signed)
Erica from [facility] called and states pt is having bilateral feet swelling, constant pain, not red, not warm to the touch, has open wound on third toe being treated. Wants pt to have glucometer and pain medication. Informed her that glucometer would need to come from pcp. I see rx for hydrocodone from 06/09/18 by Dr. Lucky Cowboy. Please advise what Danae Chen should do for pt.

## 2018-07-07 ENCOUNTER — Telehealth (INDEPENDENT_AMBULATORY_CARE_PROVIDER_SITE_OTHER): Payer: Self-pay

## 2018-07-07 ENCOUNTER — Telehealth (INDEPENDENT_AMBULATORY_CARE_PROVIDER_SITE_OTHER): Payer: Self-pay | Admitting: Vascular Surgery

## 2018-07-07 NOTE — Telephone Encounter (Signed)
Mark from [facility] called and left a message on the triage line and stated that the patient is in 10/10 pain. Burning in left second metatarsal and right third metatarsal. Edema. Unable to walk. Color is darkening. Would like a order for a wheelchair for patient or a walker if the wheelchair is unable. Per previous telephone call the patient was called by Tammy to try and get scheduled and his voicemail was full and she was unable to contact him. Please advise

## 2018-07-07 NOTE — Telephone Encounter (Signed)
Please call patient and add requested ultrasound to the 07/09/2018 visit

## 2018-07-08 ENCOUNTER — Emergency Department
Admission: EM | Admit: 2018-07-08 | Discharge: 2018-07-08 | Disposition: A | Discharge status: Home / Self Care | Payer: Medicare Other | Attending: Emergency Medicine | Admitting: Emergency Medicine

## 2018-07-08 ENCOUNTER — Encounter: Payer: Self-pay | Admitting: Emergency Medicine

## 2018-07-08 ENCOUNTER — Other Ambulatory Visit: Payer: Self-pay

## 2018-07-08 DIAGNOSIS — Z79899 Other long term (current) drug therapy: Secondary | ICD-10-CM | POA: Insufficient documentation

## 2018-07-08 DIAGNOSIS — F17210 Nicotine dependence, cigarettes, uncomplicated: Secondary | ICD-10-CM | POA: Insufficient documentation

## 2018-07-08 DIAGNOSIS — E119 Type 2 diabetes mellitus without complications: Secondary | ICD-10-CM | POA: Insufficient documentation

## 2018-07-08 DIAGNOSIS — I120 Hypertensive chronic kidney disease with stage 5 chronic kidney disease or end stage renal disease: Secondary | ICD-10-CM | POA: Insufficient documentation

## 2018-07-08 DIAGNOSIS — N186 End stage renal disease: Secondary | ICD-10-CM | POA: Diagnosis not present

## 2018-07-08 DIAGNOSIS — M79671 Pain in right foot: Secondary | ICD-10-CM

## 2018-07-08 DIAGNOSIS — Z992 Dependence on renal dialysis: Secondary | ICD-10-CM | POA: Insufficient documentation

## 2018-07-08 DIAGNOSIS — M79672 Pain in left foot: Secondary | ICD-10-CM | POA: Insufficient documentation

## 2018-07-08 DIAGNOSIS — Z7902 Long term (current) use of antithrombotics/antiplatelets: Secondary | ICD-10-CM | POA: Diagnosis not present

## 2018-07-08 DIAGNOSIS — Z7982 Long term (current) use of aspirin: Secondary | ICD-10-CM | POA: Diagnosis not present

## 2018-07-08 LAB — COMPREHENSIVE METABOLIC PANEL
ALT: 51 U/L — ABNORMAL HIGH (ref 0–44)
AST: 54 U/L — ABNORMAL HIGH (ref 15–41)
Albumin: 3.3 g/dL — ABNORMAL LOW (ref 3.5–5.0)
Alkaline Phosphatase: 49 U/L (ref 38–126)
Anion gap: 12 (ref 5–15)
BUN: 74 mg/dL — ABNORMAL HIGH (ref 8–23)
CO2: 24 mmol/L (ref 22–32)
Calcium: 8.7 mg/dL — ABNORMAL LOW (ref 8.9–10.3)
Chloride: 101 mmol/L (ref 98–111)
Creatinine, Ser: 7.57 mg/dL — ABNORMAL HIGH (ref 0.61–1.24)
GFR calc Af Amer: 8 mL/min — ABNORMAL LOW (ref >60)
GFR calc non Af Amer: 6 mL/min — ABNORMAL LOW (ref >60)
Glucose, Bld: 96 mg/dL (ref 70–99)
Potassium: 4.1 mmol/L (ref 3.5–5.1)
Sodium: 137 mmol/L (ref 135–145)
Total Bilirubin: 0.6 mg/dL (ref 0.3–1.2)
Total Protein: 7 g/dL (ref 6.5–8.1)

## 2018-07-08 LAB — CBC
HCT: 27.5 % — ABNORMAL LOW (ref 39.0–52.0)
Hemoglobin: 8.9 g/dL — ABNORMAL LOW (ref 13.0–17.0)
MCH: 31.7 pg (ref 26.0–34.0)
MCHC: 32.4 g/dL (ref 30.0–36.0)
MCV: 97.9 fL (ref 80.0–100.0)
NRBC: 0 % (ref 0.0–0.2)
Platelets: 176 10*3/uL (ref 150–400)
RBC: 2.81 MIL/uL — ABNORMAL LOW (ref 4.22–5.81)
RDW: 15.6 % — ABNORMAL HIGH (ref 11.5–15.5)
WBC: 8.1 10*3/uL (ref 4.0–10.5)

## 2018-07-08 LAB — URIC ACID: Uric Acid, Serum: 7.6 mg/dL (ref 3.7–8.6)

## 2018-07-08 LAB — URINALYSIS, COMPLETE (UACMP) WITH MICROSCOPIC
Bacteria, UA: NONE SEEN
Bilirubin Urine: NEGATIVE
Glucose, UA: NEGATIVE mg/dL
Hgb urine dipstick: NEGATIVE
Ketones, ur: NEGATIVE mg/dL
LEUKOCYTES UA: NEGATIVE
NITRITE: NEGATIVE
Protein, ur: 100 mg/dL — AB
Specific Gravity, Urine: 1.014 (ref 1.005–1.030)
pH: 6 (ref 5.0–8.0)

## 2018-07-08 MED ORDER — OXYCODONE-ACETAMINOPHEN 5-325 MG PO TABS
1.0000 | ORAL_TABLET | Freq: Once | ORAL | Status: AC
  Administered 2018-07-08: 1 via ORAL
  Filled 2018-07-08: qty 1

## 2018-07-08 MED ORDER — OXYCODONE-ACETAMINOPHEN 5-325 MG PO TABS: 1.0000 | ORAL_TABLET | Freq: Four times a day (QID) | ORAL | 0 refills | Status: DC | PRN

## 2018-07-08 NOTE — ED Provider Notes (Signed)
[facility] Emergency Department Provider Note ____________________________________________   First MD Initiated Contact with Patient 07/08/18 1615     (approximate)  I have reviewed the triage vital signs and the nursing notes.   HISTORY  Chief Complaint Foot Pain    HPI Jose [name] is a 73 y.o. male with PMH as noted below including ESRD on dialysis, peripheral vascular disease with history of gangrene, and also a history of gout who presents with bilateral foot pain over the last week, gradual onset, worsening course, and worse in the right foot and around the right third digit which is where he had gangrene.  The patient has an ulcer there which has not changed recently.  He states that the symptoms started ever since he left the hospital a few weeks ago after a procedure to open up the circulation.  He denies any trauma but states that it is too painful for him to bear weight and it has caused him to miss his last 2 dialysis sessions.  Past Medical History:  Diagnosis Date  . Chronic kidney disease   . Depression   . Diabetes mellitus without complication ([place])   . Glaucoma   . Gout   . HLD (hyperlipidemia)   . Hypertension   . Prostate cancer ([place])   . Renal failure   . Stroke [facility])     Patient Active Problem List   Diagnosis Date Noted  . Gangrene of toe of right foot ([place]) 06/13/2018  . Diabetes mellitus ([place]) 04/16/2018  . Hypertension 04/16/2018  . ESRD on dialysis ([place]) 04/16/2018  . Malnutrition of moderate degree 03/27/2018  . Renal failure 03/21/2018    Past Surgical History:  Procedure Laterality Date  . APPENDECTOMY    . AV FISTULA PLACEMENT Left 06/09/2018   Procedure: ARTERIOVENOUS (AV) FISTULA CREATION ( BRACHIAL CEPHALIC);  Surgeon: Algernon Huxley, MD;  Location: [facility] ORS;  Service: Vascular;  Laterality: Left;  . CHOLECYSTECTOMY    . DIALYSIS/PERMA CATHETER INSERTION N/A 03/24/2018   Procedure: DIALYSIS/PERMA CATHETER  INSERTION;  Surgeon: Katha Cabal, MD;  Location: [place] [facility];  Service: Cardiovascular;  Laterality: N/A;  . INSERTION PROSTATE RADIATION SEED    . LOWER EXTREMITY ANGIOGRAPHY Right 06/16/2018   Procedure: LOWER EXTREMITY ANGIOGRAPHY;  Surgeon: Algernon Huxley, MD;  Location: [place] [facility];  Service: Cardiovascular;  Laterality: Right;    Prior to Admission medications   Medication Sig Start Date End Date Taking? Authorizing Provider  allopurinol (ZYLOPRIM) 100 MG tablet Take 100 mg by mouth daily.     [provider]  amLODipine (NORVASC) 10 MG tablet Take 10 mg by mouth daily.     [provider]  aspirin EC 81 MG EC tablet Take 1 tablet (81 mg total) by mouth daily. 06/18/18   Dustin Flock, MD  cloNIDine (CATAPRES) 0.2 MG tablet TAKE 1 TABLET BY MOUTH TWICE A DAY 05/14/18   Algernon Huxley, MD  clopidogrel (PLAVIX) 75 MG tablet Take 1 tablet (75 mg total) by mouth daily. 06/18/18   Dustin Flock, MD  escitalopram (LEXAPRO) 10 MG tablet Take 10 mg by mouth daily.     [provider]  hydrALAZINE (APRESOLINE) 25 MG tablet Take 1 tablet (25 mg total) by mouth every 8 (eight) hours. 03/27/18   Loletha Grayer, MD  HYDROcodone-acetaminophen (NORCO) 5-325 MG tablet Take 1 tablet by mouth every 6 (six) hours as needed for moderate pain. 06/09/18   Algernon Huxley, MD  multivitamin (RENA-VIT) TABS tablet Take 1 tablet by mouth at bedtime. 03/27/18   Loletha Grayer, MD  Nutritional Supplements (FEEDING SUPPLEMENT, NEPRO CARB STEADY,) LIQD Take 237 mLs by mouth 2 (two) times daily between meals. 03/27/18   Loletha Grayer, MD  oxyCODONE-acetaminophen (PERCOCET) 5-325 MG tablet Take 1 tablet by mouth every 6 (six) hours as needed for up to 5 days for severe pain. 07/08/18 07/13/18  Arta Silence, MD    Allergies Penicillin g  No family history on file.  Social History Social History   Tobacco Use  . Smoking status: Current Some Day Smoker     Packs/day: 0.10    Types: Cigarettes  . Smokeless tobacco: Never Used  Substance Use Topics  . Alcohol use: Not Currently    Alcohol/week: 0.0 standard drinks    Frequency: Never  . Drug use: Never    Review of Systems  Constitutional: No fever/chills. Eyes: No redness. ENT: No sore throat. Cardiovascular: Denies chest pain. Respiratory: Denies shortness of breath. Gastrointestinal: No nausea. Genitourinary: Negative for flank pain.  Musculoskeletal: Negative for back pain.  Positive for bilateral foot pain.  Skin: Negative for rash. Neurological: Negative for focal weakness or numbness.   ____________________________________________   PHYSICAL EXAM:  VITAL SIGNS: ED Triage Vitals  Enc Vitals Group     BP 07/08/18 1249 (!) 143/75     Pulse Rate 07/08/18 1249 62     Resp 07/08/18 1249 18     Temp 07/08/18 1249 98.2 F (36.8 C)     Temp Source 07/08/18 1249 Oral     SpO2 07/08/18 1249 99 %     Weight 07/08/18 1252 146 lb (66.2 kg)     Height 07/08/18 1252 6\' 1"  (1.854 m)     Head Circumference --      Peak Flow --      Pain Score 07/08/18 1251 10     Pain Loc --      Pain Edu? --      Excl. in [place]? --     Constitutional: Alert and oriented.  Relatively well appearing and in no acute distress. Eyes: Conjunctivae are normal.  Head: Atraumatic. Nose: No congestion/rhinnorhea. Mouth/Throat: Mucous membranes are moist.   Neck: Normal range of motion.  Cardiovascular: Good peripheral circulation. Respiratory: Normal respiratory effort.  Gastrointestinal: No distention.  Musculoskeletal: Mild bilateral lower extremity edema over the feet.  Extremities warm and well perfused.  Cap refill <2 sec to feet bilaterally.  Right third toe appears discolored with healing ulcer at the tip. Neurologic:  Normal speech and language. No gross focal neurologic deficits are appreciated.  Skin:  Skin is warm and dry. No rash noted. Psychiatric: Mood and affect are normal. Speech and  behavior are normal.  ____________________________________________   LABS (all labs ordered are listed, but only abnormal results are displayed)  Labs Reviewed  CBC - Abnormal; Notable for the following components:      Result Value   RBC 2.81 (*)    Hemoglobin 8.9 (*)    HCT 27.5 (*)    RDW 15.6 (*)    All other components within normal limits  COMPREHENSIVE METABOLIC PANEL - Abnormal; Notable for the following components:   BUN 74 (*)    Creatinine, Ser 7.57 (*)    Calcium 8.7 (*)    Albumin 3.3 (*)    AST 54 (*)    ALT 51 (*)    GFR calc non Af Amer 6 (*)    GFR  calc Af Amer 8 (*)    All other components within normal limits  URINALYSIS, COMPLETE (UACMP) WITH MICROSCOPIC - Abnormal; Notable for the following components:   Color, Urine YELLOW (*)    APPearance CLEAR (*)    Protein, ur 100 (*)    All other components within normal limits  URIC ACID   ____________________________________________  EKG   ____________________________________________  RADIOLOGY    ____________________________________________   PROCEDURES  Procedure(s) performed: No  Procedures  Critical Care performed: No ____________________________________________   INITIAL IMPRESSION / ASSESSMENT AND PLAN / ED COURSE  Pertinent labs & imaging results that were available during my care of the patient were reviewed by me and considered in my medical decision making (see chart for details).  73 year old male with history of ESRD on dialysis and peripheral vascular disease with recent angioplasty presents with bilateral foot pain over the last 1 to 2 weeks.  He states it has been difficult for him to bear weight and he has been unable to go to his last 2 dialysis sessions.  I reviewed the past medical records in epic.  The patient was admitted in January and had angiogram performed by Dr. Lucky Cowboy on 06/16/2018 with bilateral stents placed.  He did not require an amputation during the  admission.  On exam today he does have some swelling and tenderness to the dorsum of bilateral feet especially distally.  The right third toe appears dusky and discolored although this appearance is similar to what is described during his last admission.  There is a small ulcer but it appears dry and healing.  There is no erythema or induration to the feet to suggest cellulitis or gout.  Lab work-up obtained from triage is reassuring and the patient has no hyperkalemia or other concerning abnormalities related to missed dialysis.  He denies any other acute symptoms.  I will consult Dr. Lucky Cowboy from vascular surgery to discuss further management.  ----------------------------------------- 8:27 PM on 07/08/2018 -----------------------------------------  I consulted Dr. Lucky Cowboy from general surgery.  He advises that based on the description of the symptoms and exam, the patient is appropriate for follow-up with him which is already scheduled for tomorrow if he can get there.  He stated that if the patient needs admission for pain control or inability to ambulate he could be seen in the hospital.  On reassessment after getting Percocet, the patient states that his pain is significantly improved and he feels comfortable.  I gave the patient the choice to be admitted if he wanted to although he states he definitely would prefer to see Dr. Lucky Cowboy in the office tomorrow.  I will give an additional dose of Percocet before he leaves and he has hydrocodone at home.  At this time the patient is stable for discharge.  Return precautions given, and he expresses understanding. ____________________________________________   FINAL CLINICAL IMPRESSION(S) / ED DIAGNOSES  Final diagnoses:  Bilateral foot pain      NEW MEDICATIONS STARTED DURING THIS VISIT:  New Prescriptions   OXYCODONE-ACETAMINOPHEN (PERCOCET) 5-325 MG TABLET    Take 1 tablet by mouth every 6 (six) hours as needed for up to 5 days for severe pain.      Note:  This document was prepared using Dragon voice recognition software and may include unintentional dictation errors.    Arta Silence, MD 07/08/18 2030

## 2018-07-08 NOTE — Telephone Encounter (Signed)
Pt has a appointment on 07-09-18 with Dr. Lucky Cowboy

## 2018-07-08 NOTE — ED Triage Notes (Signed)
Pt in via EMS from home with c/o toe pain. EMS reports pt toes red and swollen. Pt with hx of gout. Pt is a dialysis pt and has missed the last 1-2 treatments.  142/76 HR 66 FSBS 141

## 2018-07-08 NOTE — ED Notes (Signed)
ED Provider at bedside. 

## 2018-07-08 NOTE — ED Notes (Signed)
If patient discharged call [place] at [phone] for ride home.

## 2018-07-08 NOTE — ED Triage Notes (Signed)
Pt here with c/o bilateral foot pain, started about a week ago, has gout but doesn't feel this is related, states a "burning" pain across the top of the toes bilaterally, has been unable to go to the past 2 dialysis appointments due to the pain and unable to get out of the bed. Appears in NAD.

## 2018-07-08 NOTE — Discharge Instructions (Signed)
Follow-up with Dr. Lucky Cowboy tomorrow as scheduled.  Return to the ER for new or worsening pain, swelling, weakness or numbness, or any other new or worsening symptoms that concern you.

## 2018-07-08 NOTE — Telephone Encounter (Signed)
Although the patient has an appointment tomorrow, his symptoms are concerning.  I think we have some openings around 2 or so in ultrasound today, let's see if he can come in today to ensure he doesn't have an acute thrombus.  A wheelchair is fine.

## 2018-07-08 NOTE — Telephone Encounter (Signed)
Tried to contact [place] but no answer.

## 2018-07-09 ENCOUNTER — Other Ambulatory Visit (INDEPENDENT_AMBULATORY_CARE_PROVIDER_SITE_OTHER): Payer: Self-pay | Admitting: Vascular Surgery

## 2018-07-09 ENCOUNTER — Ambulatory Visit (INDEPENDENT_AMBULATORY_CARE_PROVIDER_SITE_OTHER): Payer: Medicare Other | Admitting: Vascular Surgery

## 2018-07-09 ENCOUNTER — Ambulatory Visit (INDEPENDENT_AMBULATORY_CARE_PROVIDER_SITE_OTHER): Payer: Medicare Other

## 2018-07-09 ENCOUNTER — Encounter (INDEPENDENT_AMBULATORY_CARE_PROVIDER_SITE_OTHER): Payer: Self-pay | Admitting: Vascular Surgery

## 2018-07-09 ENCOUNTER — Encounter (INDEPENDENT_AMBULATORY_CARE_PROVIDER_SITE_OTHER): Payer: Medicare Other

## 2018-07-09 VITALS — BP 170/66 | HR 98 | Resp 14 | Ht 73.0 in | Wt 163.8 lb

## 2018-07-09 DIAGNOSIS — I70261 Atherosclerosis of native arteries of extremities with gangrene, right leg: Secondary | ICD-10-CM

## 2018-07-09 DIAGNOSIS — E1122 Type 2 diabetes mellitus with diabetic chronic kidney disease: Secondary | ICD-10-CM

## 2018-07-09 DIAGNOSIS — Z95820 Peripheral vascular angioplasty status with implants and grafts: Secondary | ICD-10-CM

## 2018-07-09 DIAGNOSIS — N186 End stage renal disease: Secondary | ICD-10-CM

## 2018-07-09 DIAGNOSIS — I70269 Atherosclerosis of native arteries of extremities with gangrene, unspecified extremity: Secondary | ICD-10-CM | POA: Insufficient documentation

## 2018-07-09 DIAGNOSIS — I10 Essential (primary) hypertension: Secondary | ICD-10-CM

## 2018-07-09 DIAGNOSIS — I96 Gangrene, not elsewhere classified: Secondary | ICD-10-CM

## 2018-07-09 DIAGNOSIS — I120 Hypertensive chronic kidney disease with stage 5 chronic kidney disease or end stage renal disease: Secondary | ICD-10-CM | POA: Diagnosis not present

## 2018-07-09 DIAGNOSIS — Z992 Dependence on renal dialysis: Secondary | ICD-10-CM

## 2018-07-09 NOTE — Assessment & Plan Note (Signed)
Fistula in the left arm with good flow can likely be used for dialysis at any point.

## 2018-07-09 NOTE — Progress Notes (Signed)
MRN : [id number]  PATRICIA PERALES is a 73 y.o. (March 25, 1946) male who presents with chief complaint of  Chief Complaint  Patient presents with  . Follow-up  .  History of Present Illness: Patient returns today in follow up of peripheral arterial disease.  He is having burning pain in both feet but worse on the right where he has the toe ulceration.  He underwent extensive revascularization of the right lower extremity several weeks ago.  He has been to the emergency department.  He has missed 2 dialysis treatments because his pain was so severe he could not get out of bed.  The patient reports no fevers or chills.  No significant drainage from the wounds. To evaluate his perfusion, noninvasive studies were performed today.  These demonstrate a right ABI of 0.98 and a left ABI of 0.87 with biphasic waveforms bilaterally.  He still has some reduction of the digital pressure on the right but this is a marked improvement after his intervention last month.  Current Outpatient Medications  Medication Sig Dispense Refill  . allopurinol (ZYLOPRIM) 100 MG tablet Take 100 mg by mouth daily.     Marland Kitchen amLODipine (NORVASC) 10 MG tablet Take 10 mg by mouth daily.     Marland Kitchen aspirin EC 81 MG EC tablet Take 1 tablet (81 mg total) by mouth daily.    . cloNIDine (CATAPRES) 0.2 MG tablet TAKE 1 TABLET BY MOUTH TWICE A DAY 60 tablet 0  . clopidogrel (PLAVIX) 75 MG tablet Take 1 tablet (75 mg total) by mouth daily. 30 tablet 2  . escitalopram (LEXAPRO) 10 MG tablet Take 10 mg by mouth daily.     . hydrALAZINE (APRESOLINE) 25 MG tablet Take 1 tablet (25 mg total) by mouth every 8 (eight) hours. 90 tablet 0  . multivitamin (RENA-VIT) TABS tablet Take 1 tablet by mouth at bedtime. 30 tablet 0  . Nutritional Supplements (FEEDING SUPPLEMENT, NEPRO CARB STEADY,) LIQD Take 237 mLs by mouth 2 (two) times daily between meals. 60 Can 0   No current facility-administered medications for this visit.     Past Medical History:    Diagnosis Date  . Chronic kidney disease   . Depression   . Diabetes mellitus without complication ([place])   . Glaucoma   . Gout   . HLD (hyperlipidemia)   . Hypertension   . Prostate cancer ([place])   . Renal failure   . Stroke [facility])     Past Surgical History:  Procedure Laterality Date  . APPENDECTOMY    . AV FISTULA PLACEMENT Left 06/09/2018   Procedure: ARTERIOVENOUS (AV) FISTULA CREATION ( BRACHIAL CEPHALIC);  Surgeon: Algernon Huxley, MD;  Location: [facility] ORS;  Service: Vascular;  Laterality: Left;  . CHOLECYSTECTOMY    . DIALYSIS/PERMA CATHETER INSERTION N/A 03/24/2018   Procedure: DIALYSIS/PERMA CATHETER INSERTION;  Surgeon: Katha Cabal, MD;  Location: [place] [facility];  Service: Cardiovascular;  Laterality: N/A;  . INSERTION PROSTATE RADIATION SEED    . LOWER EXTREMITY ANGIOGRAPHY Right 06/16/2018   Procedure: LOWER EXTREMITY ANGIOGRAPHY;  Surgeon: Algernon Huxley, MD;  Location: [place] [facility];  Service: Cardiovascular;  Laterality: Right;    Family History No bleeding disorders, clotting disorders, porphyria, or aneurysms   Social History        Tobacco Use  . Smoking status: Current Every Day Smoker  . Smokeless tobacco: Never Used  Substance Use Topics  . Alcohol use: Never    Alcohol/week: 0.0 standard  drinks    Frequency: Never  . Drug use: Never  [place]    Allergies  Allergen Reactions  . Penicillin G Other (See Comments)    Other reaction(s): Unknown DID THE REACTION INVOLVE: Swelling of the face/tongue/throat, SOB, or low BP? Unknown Sudden or severe rash/hives, skin peeling, or the inside of the mouth or nose? Unknown Did it require medical treatment? Unknown When did it last happen?unknown If all above answers are "NO", may proceed with cephalosporin use.    REVIEW OF SYSTEMS (Negative unless checked)  Constitutional: [] ?Weight loss  [] ?Fever  [] ?Chills Cardiac: [] ?Chest pain   [] ?Chest pressure   [] ?Palpitations    [] ?Shortness of breath when laying flat   [] ?Shortness of breath at rest   [] ?Shortness of breath with exertion. Vascular:  [] ?Pain in legs with walking   [] ?Pain in legs at rest   [] ?Pain in legs when laying flat   [] ?Claudication   [] ?Pain in feet when walking  [] ?Pain in feet at rest  [] ?Pain in feet when laying flat   [] ?History of DVT   [] ?Phlebitis   [] ?Swelling in legs   [] ?Varicose veins   [] ?Non-healing ulcers Pulmonary:   [] ?Uses home oxygen   [] ?Productive cough   [] ?Hemoptysis   [] ?Wheeze  [] ?COPD   [] ?Asthma Neurologic:  [] ?Dizziness  [] ?Blackouts   [] ?Seizures   [] ?History of stroke   [] ?History of TIA  [] ?Aphasia   [] ?Temporary blindness   [] ?Dysphagia   [] ?Weakness or numbness in arms   [] ?Weakness or numbness in legs Musculoskeletal:  [x] ?Arthritis   [] ?Joint swelling   [] ?Joint pain   [] ?Low back pain Hematologic:  [] ?Easy bruising  [] ?Easy bleeding   [] ?Hypercoagulable state   [x] ?Anemic  [] ?Hepatitis Gastrointestinal:  [] ?Blood in stool   [] ?Vomiting blood  [x] ?Gastroesophageal reflux/heartburn   [] ?Abdominal pain Genitourinary:  [x] ?Chronic kidney disease   [] ?Difficult urination  [] ?Frequent urination  [] ?Burning with urination   [] ?Hematuria Skin:  [] ?Rashes   [] ?Ulcers   [] ?Wounds Psychological:  [] ?History of anxiety   [] ? History of major depression.   Physical Examination  BP (!) 170/66 (BP Location: Right Arm, Patient Position: Sitting)   Pulse 98   Resp 14   Ht 6\' 1"  (1.854 m)   Wt 163 lb 12.8 oz (74.3 kg)   BMI 21.61 kg/m  Gen:  WD/WN, NAD Head: [place]/AT, No temporalis wasting. Ear/Nose/Throat: Hearing grossly intact, nares w/o erythema or drainage Eyes: Conjunctiva clear. Sclera non-icteric Neck: Supple.  Trachea midline Pulmonary:  Good air movement, no use of accessory muscles.  Cardiac: RRR, no JVD Vascular: Left arm AV fistula with good thrill Vessel Right Left  Radial Palpable Palpable                          PT Not Palpable Not Palpable  DP 1+  Palpable 1+ Palpable   Gastrointestinal: soft, non-tender/non-distended.  Musculoskeletal: M/S 5/5 throughout.  Right third toe has a circular ulcer on the base of the toe and it remains dark and somewhat discolored.  1-2+ BLE edema. Neurologic: Sensation grossly intact in extremities.  Symmetrical.  Speech is fluent.  Psychiatric: Judgment intact, Mood & affect appropriate for pt's clinical situation. Dermatologic: No rashes or ulcers noted.  No cellulitis or open wounds.       Labs Recent Results (from the past 2160 hour(s))  APTT     Status: None   Collection Time: 05/28/18  2:12 PM  Result Value Ref Range   aPTT 31 24 -  36 seconds    Comment: Performed at [facility], [address] [place], [place] [address]  Basic metabolic panel     Status: Abnormal   Collection Time: 05/28/18  2:12 PM  Result Value Ref Range   Sodium 137 135 - 145 mmol/L   Potassium 3.4 (L) 3.5 - 5.1 mmol/L   Chloride 99 98 - 111 mmol/L   CO2 29 22 - 32 mmol/L   Glucose, Bld 94 70 - 99 mg/dL   BUN 33 (H) 8 - 23 mg/dL   Creatinine, Ser 4.62 (H) 0.61 - 1.24 mg/dL   Calcium 8.7 (L) 8.9 - 10.3 mg/dL   GFR calc non Af Amer 12 (L) >60 mL/min   GFR calc Af Amer 14 (L) >60 mL/min   Anion gap 9 5 - 15    Comment: Performed at [facility], [address] [place], [place] [address]  CBC WITH DIFFERENTIAL     Status: Abnormal   Collection Time: 05/28/18  2:12 PM  Result Value Ref Range   WBC 8.6 4.0 - 10.5 K/uL   RBC 3.13 (L) 4.22 - 5.81 MIL/uL   Hemoglobin 10.0 (L) 13.0 - 17.0 g/dL   HCT 30.8 (L) 39.0 - 52.0 %   MCV 98.4 80.0 - 100.0 fL   MCH 31.9 26.0 - 34.0 pg   MCHC 32.5 30.0 - 36.0 g/dL   RDW 14.7 11.5 - 15.5 %   Platelets 153 150 - 400 K/uL   nRBC 0.0 0.0 - 0.2 %   Neutrophils Relative % 49 %   Neutro Abs 4.3 1.7 - 7.7 K/uL   Lymphocytes Relative 29 %   Lymphs Abs 2.5 0.7 - 4.0 K/uL   Monocytes Relative 11 %   Monocytes Absolute 0.9 0.1 - 1.0 K/uL   Eosinophils Relative  10 %   Eosinophils Absolute 0.9 (H) 0.0 - 0.5 K/uL   Basophils Relative 1 %   Basophils Absolute 0.1 0.0 - 0.1 K/uL   Immature Granulocytes 0 %   Abs Immature Granulocytes 0.02 0.00 - 0.07 K/uL    Comment: Performed at [facility], [address] [place], [place] [address]  Protime-INR     Status: None   Collection Time: 05/28/18  2:12 PM  Result Value Ref Range   Prothrombin Time 13.3 11.4 - 15.2 seconds   INR 1.02     Comment: Performed at [facility], [address] [place], [place] [address]  Type and screen     Status: None   Collection Time: 05/28/18  2:12 PM  Result Value Ref Range   ABO/RH(D) A POS    Antibody Screen NEG    Sample Expiration 06/11/2018    Extend sample reason      NO TRANSFUSIONS OR PREGNANCY IN THE PAST 3 MONTHS Performed at [facility], [address], [place], [place] [address]   Potassium     Status: None   Collection Time: 06/08/18 11:30 AM  Result Value Ref Range   Potassium 3.9 3.5 - 5.1 mmol/L    Comment: Performed at [facility], [address] [place], [place] [address]  Glucose, capillary     Status: None   Collection Time: 06/09/18 10:04 AM  Result Value Ref Range   Glucose-Capillary 95 70 - 99 mg/dL  I-STAT 4, (NA,K, GLUC, HGB,HCT)     Status: Abnormal   Collection Time: 06/09/18 10:41 AM  Result Value Ref Range   Sodium 142 135 - 145  mmol/L   Potassium 3.4 (L) 3.5 - 5.1 mmol/L   Glucose, Bld 91 70 - 99 mg/dL   HCT 32.0 (L) 39.0 - 52.0 %   Hemoglobin 10.9 (L) 13.0 - 17.0 g/dL  ABO/Rh     Status: None   Collection Time: 06/09/18 10:44 AM  Result Value Ref Range   ABO/RH(D)      A POS Performed at [facility], [address] [place], [place] [address]   Glucose, capillary     Status: None   Collection Time: 06/09/18  2:06 PM  Result Value Ref Range   Glucose-Capillary 81 70 - 99 mg/dL  Comprehensive metabolic panel     Status: Abnormal   Collection Time: 06/12/18  4:41 PM   Result Value Ref Range   Sodium 139 135 - 145 mmol/L   Potassium 3.1 (L) 3.5 - 5.1 mmol/L   Chloride 98 98 - 111 mmol/L   CO2 30 22 - 32 mmol/L   Glucose, Bld 84 70 - 99 mg/dL   BUN 13 8 - 23 mg/dL   Creatinine, Ser 2.54 (H) 0.61 - 1.24 mg/dL   Calcium 8.4 (L) 8.9 - 10.3 mg/dL   Total Protein 7.6 6.5 - 8.1 g/dL   Albumin 3.7 3.5 - 5.0 g/dL   AST 25 15 - 41 U/L   ALT 15 0 - 44 U/L   Alkaline Phosphatase 40 38 - 126 U/L   Total Bilirubin 0.3 0.3 - 1.2 mg/dL   GFR calc non Af Amer 24 (L) >60 mL/min   GFR calc Af Amer 28 (L) >60 mL/min   Anion gap 11 5 - 15    Comment: Performed at [facility], [address] [place], [place] [address]  CBC with Differential     Status: Abnormal   Collection Time: 06/12/18  4:41 PM  Result Value Ref Range   WBC 8.5 4.0 - 10.5 K/uL   RBC 3.29 (L) 4.22 - 5.81 MIL/uL   Hemoglobin 10.6 (L) 13.0 - 17.0 g/dL   HCT 32.0 (L) 39.0 - 52.0 %   MCV 97.3 80.0 - 100.0 fL   MCH 32.2 26.0 - 34.0 pg   MCHC 33.1 30.0 - 36.0 g/dL   RDW 15.0 11.5 - 15.5 %   Platelets 184 150 - 400 K/uL   nRBC 0.0 0.0 - 0.2 %   Neutrophils Relative % 52 %   Neutro Abs 4.5 1.7 - 7.7 K/uL   Lymphocytes Relative 27 %   Lymphs Abs 2.3 0.7 - 4.0 K/uL   Monocytes Relative 10 %   Monocytes Absolute 0.8 0.1 - 1.0 K/uL   Eosinophils Relative 9 %   Eosinophils Absolute 0.8 (H) 0.0 - 0.5 K/uL   Basophils Relative 1 %   Basophils Absolute 0.1 0.0 - 0.1 K/uL   Immature Granulocytes 1 %   Abs Immature Granulocytes 0.04 0.00 - 0.07 K/uL    Comment: Performed at [facility], [address] [place], [place] [address]  CG4 I-STAT (Lactic acid)     Status: None   Collection Time: 06/12/18  4:48 PM  Result Value Ref Range   Lactic Acid, Venous 1.04 0.5 - 1.9 mmol/L  Blood Culture (routine x 2)     Status: None   Collection Time: 06/13/18 12:32 AM  Result Value Ref Range   Specimen Description BLOOD RIGHT FOREARM    Special Requests      BOTTLES DRAWN AEROBIC ONLY  Blood Culture adequate volume  Culture      NO GROWTH 5 DAYS Performed at [facility], [address] [place], [place] [address]    Report Status 06/18/2018 FINAL   Blood Culture (routine x 2)     Status: None   Collection Time: 06/13/18 12:32 AM  Result Value Ref Range   Specimen Description BLOOD RIGHT FOREARM UPPER    Special Requests      BOTTLES DRAWN AEROBIC AND ANAEROBIC Blood Culture results may not be optimal due to an excessive volume of blood received in culture bottles   Culture      NO GROWTH 5 DAYS Performed at [facility], [address] [place], [place] [address]    Report Status 06/18/2018 FINAL   APTT     Status: None   Collection Time: 06/13/18 12:32 AM  Result Value Ref Range   aPTT 30 24 - 36 seconds    Comment: Performed at [facility], [address] [place], [place] [address]  Protime-INR     Status: None   Collection Time: 06/13/18 12:32 AM  Result Value Ref Range   Prothrombin Time 14.6 11.4 - 15.2 seconds   INR 1.15     Comment: Performed at [facility], [address] [place], [place] [address]  Basic metabolic panel     Status: Abnormal   Collection Time: 06/13/18  5:01 AM  Result Value Ref Range   Sodium 141 135 - 145 mmol/L   Potassium 3.2 (L) 3.5 - 5.1 mmol/L   Chloride 100 98 - 111 mmol/L   CO2 31 22 - 32 mmol/L   Glucose, Bld 143 (H) 70 - 99 mg/dL   BUN 25 (H) 8 - 23 mg/dL   Creatinine, Ser 3.93 (H) 0.61 - 1.24 mg/dL    Comment: RESULTS VERIFIED BY REPEAT TESTING SNJ   Calcium 8.7 (L) 8.9 - 10.3 mg/dL   GFR calc non Af Amer 14 (L) >60 mL/min   GFR calc Af Amer 17 (L) >60 mL/min   Anion gap 10 5 - 15    Comment: Performed at [facility], [address] [place], [place] [address]  CBC     Status: Abnormal   Collection Time: 06/13/18  5:01 AM  Result Value Ref Range   WBC 6.9 4.0 - 10.5 K/uL   RBC 3.07 (L) 4.22 - 5.81 MIL/uL   Hemoglobin 9.5 (L) 13.0 - 17.0 g/dL   HCT 29.7 (L)  39.0 - 52.0 %   MCV 96.7 80.0 - 100.0 fL   MCH 30.9 26.0 - 34.0 pg   MCHC 32.0 30.0 - 36.0 g/dL   RDW 15.0 11.5 - 15.5 %   Platelets 165 150 - 400 K/uL   nRBC 0.0 0.0 - 0.2 %    Comment: Performed at [facility], [address] [place], [place] [address]  Glucose, capillary     Status: None   Collection Time: 06/13/18  7:49 AM  Result Value Ref Range   Glucose-Capillary 85 70 - 99 mg/dL   Comment 1 Notify RN   MRSA PCR Screening     Status: None   Collection Time: 06/13/18  7:51 AM  Result Value Ref Range   MRSA by PCR NEGATIVE NEGATIVE    Comment:        The GeneXpert MRSA Assay (FDA approved for NASAL specimens only), is one component of a comprehensive MRSA colonization surveillance program. It is not intended to diagnose MRSA infection nor to guide  or monitor treatment for MRSA infections. Performed at [facility], [place], [place] [address]   Heparin level (unfractionated)     Status: None   Collection Time: 06/13/18 10:09 AM  Result Value Ref Range   Heparin Unfractionated 0.36 0.30 - 0.70 IU/mL    Comment: (NOTE) If heparin results are below expected values, and patient dosage has  been confirmed, suggest follow up testing of antithrombin III levels. Performed at [facility], [address] [place], [place] [address]   Glucose, capillary     Status: None   Collection Time: 06/13/18 11:32 AM  Result Value Ref Range   Glucose-Capillary 76 70 - 99 mg/dL  Urinalysis, Complete w Microscopic     Status: Abnormal   Collection Time: 06/13/18 11:42 AM  Result Value Ref Range   Color, Urine YELLOW (A) YELLOW   APPearance CLEAR (A) CLEAR   Specific Gravity, Urine 1.017 1.005 - 1.030   pH 6.0 5.0 - 8.0   Glucose, UA NEGATIVE NEGATIVE mg/dL   Hgb urine dipstick NEGATIVE NEGATIVE   Bilirubin Urine NEGATIVE NEGATIVE   Ketones, ur NEGATIVE NEGATIVE mg/dL   Protein, ur 100 (A) NEGATIVE mg/dL   Nitrite NEGATIVE NEGATIVE    Leukocytes, UA TRACE (A) NEGATIVE   RBC / HPF 0-5 0 - 5 RBC/hpf   WBC, UA 0-5 0 - 5 WBC/hpf   Bacteria, UA NONE SEEN NONE SEEN   Squamous Epithelial / LPF 0-5 0 - 5   Mucus PRESENT     Comment: Performed at [facility], [address] [place], [place] [address]  Glucose, capillary     Status: None   Collection Time: 06/13/18  4:52 PM  Result Value Ref Range   Glucose-Capillary 90 70 - 99 mg/dL   Comment 1 Notify RN   Heparin level (unfractionated)     Status: None   Collection Time: 06/13/18  6:07 PM  Result Value Ref Range   Heparin Unfractionated 0.43 0.30 - 0.70 IU/mL    Comment: (NOTE) If heparin results are below expected values, and patient dosage has  been confirmed, suggest follow up testing of antithrombin III levels. Performed at [facility], [address] [place], [place] [address]   Glucose, capillary     Status: Abnormal   Collection Time: 06/13/18  9:29 PM  Result Value Ref Range   Glucose-Capillary 109 (H) 70 - 99 mg/dL  CBC     Status: Abnormal   Collection Time: 06/14/18  5:24 AM  Result Value Ref Range   WBC 7.3 4.0 - 10.5 K/uL   RBC 2.89 (L) 4.22 - 5.81 MIL/uL   Hemoglobin 9.1 (L) 13.0 - 17.0 g/dL   HCT 28.0 (L) 39.0 - 52.0 %   MCV 96.9 80.0 - 100.0 fL   MCH 31.5 26.0 - 34.0 pg   MCHC 32.5 30.0 - 36.0 g/dL   RDW 15.1 11.5 - 15.5 %   Platelets 161 150 - 400 K/uL   nRBC 0.0 0.0 - 0.2 %    Comment: Performed at [facility], [address], [place], [place] [address]  Basic metabolic panel     Status: Abnormal   Collection Time: 06/14/18  5:24 AM  Result Value Ref Range   Sodium 140 135 - 145 mmol/L   Potassium 3.9 3.5 - 5.1 mmol/L   Chloride 102 98 - 111 mmol/L   CO2 27 22 - 32 mmol/L   Glucose, Bld 91 70 - 99  mg/dL   BUN 36 (H) 8 - 23 mg/dL   Creatinine, Ser 5.43 (H) 0.61 - 1.24 mg/dL   Calcium 9.1 8.9 - 10.3 mg/dL   GFR calc non Af Amer 10 (L) >60 mL/min   GFR calc Af Amer 11 (L) >60 mL/min   Anion gap 11 5 -  15    Comment: Performed at [facility], [place], [place] [address]  Heparin level (unfractionated)     Status: None   Collection Time: 06/14/18  5:24 AM  Result Value Ref Range   Heparin Unfractionated 0.38 0.30 - 0.70 IU/mL    Comment: (NOTE) If heparin results are below expected values, and patient dosage has  been confirmed, suggest follow up testing of antithrombin III levels. Performed at [facility], [address] [place], [place] [address]   Glucose, capillary     Status: None   Collection Time: 06/14/18  7:35 AM  Result Value Ref Range   Glucose-Capillary 87 70 - 99 mg/dL  Glucose, capillary     Status: None   Collection Time: 06/14/18 11:28 AM  Result Value Ref Range   Glucose-Capillary 83 70 - 99 mg/dL  Glucose, capillary     Status: None   Collection Time: 06/14/18  4:09 PM  Result Value Ref Range   Glucose-Capillary 70 70 - 99 mg/dL  Glucose, capillary     Status: Abnormal   Collection Time: 06/14/18  9:08 PM  Result Value Ref Range   Glucose-Capillary 143 (H) 70 - 99 mg/dL   Comment 1 Notify RN   Heparin level (unfractionated)     Status: None   Collection Time: 06/15/18  3:57 AM  Result Value Ref Range   Heparin Unfractionated 0.30 0.30 - 0.70 IU/mL    Comment: (NOTE) If heparin results are below expected values, and patient dosage has  been confirmed, suggest follow up testing of antithrombin III levels. Performed at [facility], [address] [place], [place] [address]   CBC     Status: Abnormal   Collection Time: 06/15/18  3:57 AM  Result Value Ref Range   WBC 7.1 4.0 - 10.5 K/uL   RBC 2.75 (L) 4.22 - 5.81 MIL/uL   Hemoglobin 8.7 (L) 13.0 - 17.0 g/dL   HCT 27.0 (L) 39.0 - 52.0 %   MCV 98.2 80.0 - 100.0 fL   MCH 31.6 26.0 - 34.0 pg   MCHC 32.2 30.0 - 36.0 g/dL   RDW 15.2 11.5 - 15.5 %   Platelets 163 150 - 400 K/uL   nRBC 0.0 0.0 - 0.2 %    Comment: Performed at [facility], [address] [place], [place] [address]  Basic metabolic panel     Status: Abnormal   Collection Time: 06/15/18  3:57 AM  Result Value Ref Range   Sodium 138 135 - 145 mmol/L   Potassium 4.2 3.5 - 5.1 mmol/L   Chloride 102 98 - 111 mmol/L   CO2 24 22 - 32 mmol/L   Glucose, Bld 85 70 - 99 mg/dL   BUN 47 (H) 8 - 23 mg/dL   Creatinine, Ser 6.41 (H) 0.61 - 1.24 mg/dL   Calcium 8.9 8.9 - 10.3 mg/dL   GFR calc non Af Amer 8 (L) >60 mL/min   GFR calc Af Amer 9 (L) >60 mL/min   Anion gap 12 5 - 15    Comment: Performed at [facility], [address] [place],  [place] [address]  Glucose, capillary     Status: None   Collection Time: 06/15/18  7:39 AM  Result Value Ref Range   Glucose-Capillary 75 70 - 99 mg/dL   Comment 1 Notify RN   Heparin level (unfractionated)     Status: None   Collection Time: 06/15/18  9:56 AM  Result Value Ref Range   Heparin Unfractionated 0.34 0.30 - 0.70 IU/mL    Comment: (NOTE) If heparin results are below expected values, and patient dosage has  been confirmed, suggest follow up testing of antithrombin III levels. Performed at [facility], [address] [place], [place] [address]   Phosphorus     Status: Abnormal   Collection Time: 06/15/18  9:56 AM  Result Value Ref Range   Phosphorus 4.9 (H) 2.5 - 4.6 mg/dL    Comment: Performed at [facility], [address] [place], [place] [address]  Glucose, capillary     Status: None   Collection Time: 06/15/18  1:48 PM  Result Value Ref Range   Glucose-Capillary 77 70 - 99 mg/dL   Comment 1 Notify RN   Glucose, capillary     Status: Abnormal   Collection Time: 06/15/18  4:32 PM  Result Value Ref Range   Glucose-Capillary 140 (H) 70 - 99 mg/dL   Comment 1 Notify RN   Glucose, capillary     Status: None   Collection Time: 06/15/18  9:10 PM  Result Value Ref Range   Glucose-Capillary 84 70 - 99 mg/dL   Comment 1 Notify RN   Basic metabolic panel     Status: Abnormal    Collection Time: 06/16/18  4:28 AM  Result Value Ref Range   Sodium 139 135 - 145 mmol/L   Potassium 4.0 3.5 - 5.1 mmol/L   Chloride 99 98 - 111 mmol/L   CO2 29 22 - 32 mmol/L   Glucose, Bld 90 70 - 99 mg/dL   BUN 25 (H) 8 - 23 mg/dL   Creatinine, Ser 4.59 (H) 0.61 - 1.24 mg/dL   Calcium 8.9 8.9 - 10.3 mg/dL   GFR calc non Af Amer 12 (L) >60 mL/min   GFR calc Af Amer 14 (L) >60 mL/min   Anion gap 11 5 - 15    Comment: Performed at [facility], [place], [place] [address]  Heparin level (unfractionated)     Status: None   Collection Time: 06/16/18  4:28 AM  Result Value Ref Range   Heparin Unfractionated 0.40 0.30 - 0.70 IU/mL    Comment: (NOTE) If heparin results are below expected values, and patient dosage has  been confirmed, suggest follow up testing of antithrombin III levels. Performed at [facility], [address] [place], [place] [address]   CBC     Status: Abnormal   Collection Time: 06/16/18  4:28 AM  Result Value Ref Range   WBC 7.0 4.0 - 10.5 K/uL   RBC 2.88 (L) 4.22 - 5.81 MIL/uL   Hemoglobin 9.1 (L) 13.0 - 17.0 g/dL   HCT 28.1 (L) 39.0 - 52.0 %   MCV 97.6 80.0 - 100.0 fL   MCH 31.6 26.0 - 34.0 pg   MCHC 32.4 30.0 - 36.0 g/dL   RDW 14.9 11.5 - 15.5 %   Platelets 157 150 - 400 K/uL   nRBC 0.0 0.0 - 0.2 %    Comment: Performed at [facility], [address] [place], [place] [address]  Glucose,  capillary     Status: None   Collection Time: 06/16/18  7:48 AM  Result Value Ref Range   Glucose-Capillary 78 70 - 99 mg/dL   Comment 1 Notify RN   Glucose, capillary     Status: None   Collection Time: 06/16/18  9:51 AM  Result Value Ref Range   Glucose-Capillary 92 70 - 99 mg/dL   Comment 1 Notify RN   Glucose, capillary     Status: None   Collection Time: 06/16/18  1:08 PM  Result Value Ref Range   Glucose-Capillary 86 70 - 99 mg/dL  Glucose, capillary     Status: Abnormal   Collection Time: 06/16/18  5:40 PM    Result Value Ref Range   Glucose-Capillary 150 (H) 70 - 99 mg/dL  Glucose, capillary     Status: None   Collection Time: 06/16/18  8:51 PM  Result Value Ref Range   Glucose-Capillary 83 70 - 99 mg/dL   Comment 1 Notify RN   Renal function panel     Status: Abnormal   Collection Time: 06/17/18  5:53 AM  Result Value Ref Range   Sodium 137 135 - 145 mmol/L   Potassium 4.2 3.5 - 5.1 mmol/L   Chloride 98 98 - 111 mmol/L   CO2 27 22 - 32 mmol/L   Glucose, Bld 89 70 - 99 mg/dL   BUN 40 (H) 8 - 23 mg/dL   Creatinine, Ser 6.64 (H) 0.61 - 1.24 mg/dL   Calcium 8.7 (L) 8.9 - 10.3 mg/dL   Phosphorus 6.1 (H) 2.5 - 4.6 mg/dL   Albumin 3.0 (L) 3.5 - 5.0 g/dL   GFR calc non Af Amer 8 (L) >60 mL/min   GFR calc Af Amer 9 (L) >60 mL/min   Anion gap 12 5 - 15    Comment: Performed at [facility], [address] [place], [place] [address]  Phosphorus     Status: Abnormal   Collection Time: 06/17/18  5:53 AM  Result Value Ref Range   Phosphorus 5.7 (H) 2.5 - 4.6 mg/dL    Comment: Performed at [facility], [address] [place], [place] [address]  Glucose, capillary     Status: None   Collection Time: 06/17/18  7:45 AM  Result Value Ref Range   Glucose-Capillary 91 70 - 99 mg/dL   Comment 1 Notify RN   Glucose, capillary     Status: None   Collection Time: 06/17/18 12:46 PM  Result Value Ref Range   Glucose-Capillary 88 70 - 99 mg/dL  Glucose, capillary     Status: Abnormal   Collection Time: 06/17/18  4:41 PM  Result Value Ref Range   Glucose-Capillary 103 (H) 70 - 99 mg/dL   Comment 1 Notify RN   CBC     Status: Abnormal   Collection Time: 07/08/18 12:55 PM  Result Value Ref Range   WBC 8.1 4.0 - 10.5 K/uL   RBC 2.81 (L) 4.22 - 5.81 MIL/uL   Hemoglobin 8.9 (L) 13.0 - 17.0 g/dL   HCT 27.5 (L) 39.0 - 52.0 %   MCV 97.9 80.0 - 100.0 fL   MCH 31.7 26.0 - 34.0 pg   MCHC 32.4 30.0 - 36.0 g/dL   RDW 15.6 (H) 11.5 - 15.5 %   Platelets 176 150 - 400 K/uL   nRBC 0.0  0.0 - 0.2 %    Comment: Performed at [facility], [address] [place],  [address]  Comprehensive  metabolic panel     Status: Abnormal   Collection Time: 07/08/18 12:55 PM  Result Value Ref Range   Sodium 137 135 - 145 mmol/L   Potassium 4.1 3.5 - 5.1 mmol/L   Chloride 101 98 - 111 mmol/L   CO2 24 22 - 32 mmol/L   Glucose, Bld 96 70 - 99 mg/dL   BUN 74 (H) 8 - 23 mg/dL   Creatinine, Ser 7.57 (H) 0.61 - 1.24 mg/dL   Calcium 8.7 (L) 8.9 - 10.3 mg/dL   Total Protein 7.0 6.5 - 8.1 g/dL   Albumin 3.3 (L) 3.5 - 5.0 g/dL   AST 54 (H) 15 - 41 U/L   ALT 51 (H) 0 - 44 U/L   Alkaline Phosphatase 49 38 - 126 U/L   Total Bilirubin 0.6 0.3 - 1.2 mg/dL   GFR calc non Af Amer 6 (L) >60 mL/min   GFR calc Af Amer 8 (L) >60 mL/min   Anion gap 12 5 - 15    Comment: Performed at [facility], [address] [place], [place] [address]  Uric acid     Status: None   Collection Time: 07/08/18 12:55 PM  Result Value Ref Range   Uric Acid, Serum 7.6 3.7 - 8.6 mg/dL    Comment: Performed at [facility], [address] [place], [place] [address]  Urinalysis, Complete w Microscopic     Status: Abnormal   Collection Time: 07/08/18 12:56 PM  Result Value Ref Range   Color, Urine YELLOW (A) YELLOW   APPearance CLEAR (A) CLEAR   Specific Gravity, Urine 1.014 1.005 - 1.030   pH 6.0 5.0 - 8.0   Glucose, UA NEGATIVE NEGATIVE mg/dL   Hgb urine dipstick NEGATIVE NEGATIVE   Bilirubin Urine NEGATIVE NEGATIVE   Ketones, ur NEGATIVE NEGATIVE mg/dL   Protein, ur 100 (A) NEGATIVE mg/dL   Nitrite NEGATIVE NEGATIVE   Leukocytes, UA NEGATIVE NEGATIVE   WBC, UA 0-5 0 - 5 WBC/hpf   Bacteria, UA NONE SEEN NONE SEEN   Squamous Epithelial / LPF 0-5 0 - 5    Comment: Performed at [facility], [address], [place], [place] [address]    Radiology Dg Chest 2 View  Result Date: 06/12/2018 CLINICAL DATA:  Right arm swelling after central line placement. EXAM: CHEST -  2 VIEW COMPARISON:  03/21/2018 FINDINGS: Right jugular dual-lumen central venous dialysis catheter is seen in appropriate position. No evidence of pneumothorax or pleural effusion. Heart size is at the upper limits of normal. Tortuosity of thoracic aorta is stable. Both lungs are well aerated and clear. IMPRESSION: Right jugular dual-lumen central venous dialysis catheter in appropriate position. No evidence of pneumothorax. No active lung disease. Electronically Signed   By: Earle Gell M.D.   On: 06/12/2018 17:42   Dg Foot Complete Right  Result Date: 06/13/2018 CLINICAL DATA:  Infection to the right third toe. Diabetes. EXAM: RIGHT FOOT COMPLETE - 3+ VIEW COMPARISON:  05/26/2018 FINDINGS: Diffuse bone demineralization. Mild hallux valgus deformity. No evidence of acute fracture or dislocation. No focal bone lesion or bone destruction. Soft tissue gas underneath the nail bed of the third toe suggesting soft tissue infection. No underlying bone changes to suggest osteomyelitis. IMPRESSION: No acute bony abnormalities. Soft tissue gas under the third toenail suggesting soft tissue infection. No changes to suggest osteomyelitis. Electronically Signed   By: Lucienne Capers M.D.   On: 06/13/2018 00:15    Assessment/Plan Diabetes mellitus without complication ([place])  Likely an underlying cause of his renal failure and blood glucose control important in reducing the progression of atherosclerotic disease. Also, involved in wound healing. On appropriate medications.   Hypertension Likely an underlying cause of his renal failure and blood pressure control important in reducing the progression of atherosclerotic disease. On appropriate oral medications.  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  Referral to podiatry for further evaluation and treatment.  ESRD on dialysis ([place]) Fistula in the left arm with good flow can likely be used for dialysis at any point.  Atherosclerosis of  native arteries of the extremities with gangrene ([place]) The patient's noninvasive studies today demonstrate a right ABI of 0.98 and a left ABI of 0.87 after revascularization about a month ago.  His waveforms are biphasic bilaterally.  He still has some reduction of the digital pressure on the right but this is a marked improvement after revascularization.  I do not think further angiogram or intervention is warranted at this time.  I think a referral to podiatry would be reasonable to see if they can help with his toe and foot pain.  He should elevate his legs and get his regular dialysis to reduce swelling.  I have given him a prescription for a wheelchair due to immobility as well as another prescription for tramadol secondary to pain.  We will recheck ABIs in 3 months.    Leotis Pain, MD  07/09/2018 4:47 PM    This note was created with Dragon medical transcription system.  Any errors from dictation are purely unintentional

## 2018-07-09 NOTE — Patient Instructions (Signed)
Peripheral Vascular Disease  Peripheral vascular disease (PVD) is a disease of the blood vessels that are not part of your heart and brain. A simple term for PVD is poor circulation. In most cases, PVD narrows the blood vessels that carry blood from your heart to the rest of your body. This can reduce the supply of blood to your arms, legs, and internal organs, like your stomach or kidneys. However, PVD most often affects a person's lower legs and feet. Without treatment, PVD tends to get worse. PVD can also lead to acute ischemic limb. This is when an arm or leg suddenly cannot get enough blood. This is a medical emergency. Follow these instructions at home: Lifestyle  Do not use any products that contain nicotine or tobacco, such as cigarettes and e-cigarettes. If you need help quitting, ask your doctor.  Lose weight if you are overweight. Or, stay at a healthy weight as told by your doctor.  Eat a diet that is low in fat and cholesterol. If you need help, ask your doctor.  Exercise regularly. Ask your doctor for activities that are right for you. General instructions  Take over-the-counter and prescription medicines only as told by your doctor.  Take good care of your feet: ? Wear comfortable shoes that fit well. ? Check your feet often for any cuts or sores.  Keep all follow-up visits as told by your doctor This is important. Contact a doctor if:  You have cramps in your legs when you walk.  You have leg pain when you are at rest.  You have coldness in a leg or foot.  Your skin changes.  You are unable to get or have an erection (erectile dysfunction).  You have cuts or sores on your feet that do not heal. Get help right away if:  Your arm or leg turns cold, numb, and blue.  Your arms or legs become red, warm, swollen, painful, or numb.  You have chest pain.  You have trouble breathing.  You suddenly have weakness in your face, arm, or leg.  You become very  confused or you cannot speak.  You suddenly have a very bad headache.  You suddenly cannot see. Summary  Peripheral vascular disease (PVD) is a disease of the blood vessels.  A simple term for PVD is poor circulation. Without treatment, PVD tends to get worse.  Treatment may include exercise, low fat and low cholesterol diet, and quitting smoking. This information is not intended to replace advice given to you by your health care provider. Make sure you discuss any questions you have with your health care provider. Document Released: 08/13/2009 Document Revised: 06/26/2016 Document Reviewed: 06/26/2016 Elsevier Interactive Patient Education  2019 Elsevier Inc.  

## 2018-07-09 NOTE — Assessment & Plan Note (Signed)
The patient's noninvasive studies today demonstrate a right ABI of 0.98 and a left ABI of 0.87 after revascularization about a month ago.  His waveforms are biphasic bilaterally.  He still has some reduction of the digital pressure on the right but this is a marked improvement after revascularization.  I do not think further angiogram or intervention is warranted at this time.  I think a referral to podiatry would be reasonable to see if they can help with his toe and foot pain.  He should elevate his legs and get his regular dialysis to reduce swelling.  I have given him a prescription for a wheelchair due to immobility as well as another prescription for tramadol secondary to pain.  We will recheck ABIs in 3 months.

## 2018-07-09 NOTE — Assessment & Plan Note (Signed)
Stable after reperfusion.  Referral to podiatry for further evaluation and treatment.

## 2018-07-12 ENCOUNTER — Telehealth (INDEPENDENT_AMBULATORY_CARE_PROVIDER_SITE_OTHER): Payer: Self-pay

## 2018-07-12 NOTE — Telephone Encounter (Signed)
Mark from [facility] called and left a message on the triage line and stated that he wanted to make Dr. Lucky Cowboy aware that the patient was in 10/10 pain across both his toes and blood pressure is 168/60. He stated to call back with any questions or changes

## 2018-07-13 NOTE — Telephone Encounter (Signed)
Jose [name] is aware.  

## 2018-07-14 ENCOUNTER — Telehealth (INDEPENDENT_AMBULATORY_CARE_PROVIDER_SITE_OTHER): Payer: Self-pay

## 2018-07-14 NOTE — Telephone Encounter (Signed)
Erica from [facility] called and left a message on the triage line and stated that she would like to report some "findings." Returned her call at [phone] received no answer and mailbox was full.

## 2018-07-15 ENCOUNTER — Telehealth (INDEPENDENT_AMBULATORY_CARE_PROVIDER_SITE_OTHER): Payer: Self-pay

## 2018-07-15 NOTE — Telephone Encounter (Signed)
Mark from home health called and left a message on the triage line and stated that the patient has not gotten a call from podiatry and would like to know the office he is being sent to. Per referral in the system it states Dr Elvina Mattes at [facility] Called Mark at [phone] and he is aware.

## 2018-07-19 ENCOUNTER — Other Ambulatory Visit: Payer: Self-pay | Admitting: Podiatry

## 2018-07-20 ENCOUNTER — Encounter
Admission: RE | Admit: 2018-07-20 | Discharge: 2018-07-20 | Disposition: A | Discharge status: Home / Self Care | Payer: Medicare Other | Source: Ambulatory Visit | Attending: Podiatry | Admitting: Podiatry

## 2018-07-20 ENCOUNTER — Other Ambulatory Visit: Payer: Self-pay

## 2018-07-20 DIAGNOSIS — Z8546 Personal history of malignant neoplasm of prostate: Secondary | ICD-10-CM | POA: Diagnosis not present

## 2018-07-20 DIAGNOSIS — E785 Hyperlipidemia, unspecified: Secondary | ICD-10-CM | POA: Diagnosis not present

## 2018-07-20 DIAGNOSIS — Z01812 Encounter for preprocedural laboratory examination: Secondary | ICD-10-CM | POA: Insufficient documentation

## 2018-07-20 DIAGNOSIS — N189 Chronic kidney disease, unspecified: Secondary | ICD-10-CM | POA: Diagnosis not present

## 2018-07-20 DIAGNOSIS — H42 Glaucoma in diseases classified elsewhere: Secondary | ICD-10-CM | POA: Diagnosis not present

## 2018-07-20 DIAGNOSIS — F17200 Nicotine dependence, unspecified, uncomplicated: Secondary | ICD-10-CM | POA: Diagnosis not present

## 2018-07-20 DIAGNOSIS — E1152 Type 2 diabetes mellitus with diabetic peripheral angiopathy with gangrene: Secondary | ICD-10-CM | POA: Diagnosis present

## 2018-07-20 DIAGNOSIS — I96 Gangrene, not elsewhere classified: Secondary | ICD-10-CM | POA: Diagnosis present

## 2018-07-20 DIAGNOSIS — Z9049 Acquired absence of other specified parts of digestive tract: Secondary | ICD-10-CM | POA: Diagnosis not present

## 2018-07-20 DIAGNOSIS — I129 Hypertensive chronic kidney disease with stage 1 through stage 4 chronic kidney disease, or unspecified chronic kidney disease: Secondary | ICD-10-CM | POA: Diagnosis not present

## 2018-07-20 DIAGNOSIS — Z8673 Personal history of transient ischemic attack (TIA), and cerebral infarction without residual deficits: Secondary | ICD-10-CM | POA: Diagnosis not present

## 2018-07-20 DIAGNOSIS — E1139 Type 2 diabetes mellitus with other diabetic ophthalmic complication: Secondary | ICD-10-CM | POA: Diagnosis not present

## 2018-07-20 DIAGNOSIS — E1122 Type 2 diabetes mellitus with diabetic chronic kidney disease: Secondary | ICD-10-CM | POA: Diagnosis not present

## 2018-07-20 DIAGNOSIS — Z880 Allergy status to penicillin: Secondary | ICD-10-CM | POA: Diagnosis not present

## 2018-07-20 DIAGNOSIS — F329 Major depressive disorder, single episode, unspecified: Secondary | ICD-10-CM | POA: Diagnosis not present

## 2018-07-20 HISTORY — DX: Cardiac murmur, unspecified: R01.1

## 2018-07-20 NOTE — Patient Instructions (Signed)
Your procedure is scheduled on: 07/23/18 Report to Day Surgery.[facility] SECOND FLOOR To find out your arrival time please call [phone] between 1PM - 3PM on 07/22/18.  Remember: Instructions that are not followed completely may result in serious medical risk,  up to and including death, or upon the discretion of your surgeon and anesthesiologist your  surgery may need to be rescheduled.     _X__ 1. Do not eat food after midnight the night before your procedure.                 No gum chewing or hard candies. You may drink clear liquids up to 2 hours                 before you are scheduled to arrive for your surgery- DO not drink clear                 liquids within 2 hours of the start of your surgery.                 Clear Liquids include:  water, apple juice without pulp, clear carbohydrate                 drink such as Clearfast of Gatorade, Black Coffee or Tea (Do not add                 anything to coffee or tea).  __X__2.  On the morning of surgery brush your teeth with toothpaste and water, you                may rinse your mouth with mouthwash if you wish.  Do not swallow any toothpaste of mouthwash.     _X__ 3.  No Alcohol for 24 hours before or after surgery.   _X__ 4.  Do Not Smoke or use e-cigarettes For 24 Hours Prior to Your Surgery.                 Do not use any chewable tobacco products for at least 6 hours prior to                 surgery.  ____  5.  Bring all medications with you on the day of surgery if instructed.   __X__  6.  Notify your doctor if there is any change in your medical condition      (cold, fever, infections).     Do not wear jewelry, make-up, hairpins, clips or nail polish. Do not wear lotions, powders, or perfumes. You may wear deodorant. Do not shave 48 hours prior to surgery. Men may shave face and neck. Do not bring valuables to the hospital.    [facility] is not responsible for any belongings or  valuables.  Contacts, dentures or bridgework may not be worn into surgery. Leave your suitcase in the car. After surgery it may be brought to your room. For patients admitted to the hospital, discharge time is determined by your treatment team.   Patients discharged the day of surgery will not be allowed to drive home.   Please read over the following fact sheets that you were given:   Surgical Site Infection Prevention          ____ Take these medicines the morning of surgery with A SIP OF WATER:    1. ALLOPURINOL  2. AMLODIPINE  3. CLONIDINE  4. LEXAPRO  5. HYDRALAZINE  6.  ____ Fleet Enema (as directed)  __X__ Use CHG Soap as directed  ____ Use inhalers on the day of surgery  ____ Stop metformin 2 days prior to surgery    ____ Take 1/2 of usual insulin dose the night before surgery. No insulin the morning          of surgery.   __X__ Stop Coumadin/Plavix/aspirin on   STOP ASPIRIN AND PLAVIX AS INSTRUCTED BY DR FOWLER'S OFFICE  ____ Stop Anti-inflammatories on    ____ Stop supplements until after surgery.    ____ Bring C-Pap to the hospital.

## 2018-07-20 NOTE — Pre-Procedure Instructions (Signed)
NOTIFIED MORGAN AT DR Vickki Muff OFFICE PATIENT NEEDS TO BE CALLED AND INSTRUCTED IN STOPPING ASPIRIN AND PLAVIX OR NOT PREOP

## 2018-07-23 ENCOUNTER — Encounter
Admission: RE | Disposition: A | Discharge status: Home / Self Care | Payer: Self-pay | Source: Home / Self Care | Attending: Podiatry

## 2018-07-23 ENCOUNTER — Ambulatory Visit
Admission: RE | Admit: 2018-07-23 | Discharge: 2018-07-23 | Disposition: A | Discharge status: Home / Self Care | Payer: Medicare Other | Attending: Podiatry | Admitting: Podiatry

## 2018-07-23 ENCOUNTER — Encounter: Payer: Self-pay | Admitting: *Deleted

## 2018-07-23 ENCOUNTER — Ambulatory Visit: Payer: Medicare Other | Admitting: Anesthesiology

## 2018-07-23 ENCOUNTER — Other Ambulatory Visit: Payer: Self-pay

## 2018-07-23 DIAGNOSIS — F329 Major depressive disorder, single episode, unspecified: Secondary | ICD-10-CM | POA: Insufficient documentation

## 2018-07-23 DIAGNOSIS — I129 Hypertensive chronic kidney disease with stage 1 through stage 4 chronic kidney disease, or unspecified chronic kidney disease: Secondary | ICD-10-CM | POA: Insufficient documentation

## 2018-07-23 DIAGNOSIS — Z880 Allergy status to penicillin: Secondary | ICD-10-CM | POA: Insufficient documentation

## 2018-07-23 DIAGNOSIS — Z9049 Acquired absence of other specified parts of digestive tract: Secondary | ICD-10-CM | POA: Insufficient documentation

## 2018-07-23 DIAGNOSIS — F17200 Nicotine dependence, unspecified, uncomplicated: Secondary | ICD-10-CM | POA: Insufficient documentation

## 2018-07-23 DIAGNOSIS — Z8546 Personal history of malignant neoplasm of prostate: Secondary | ICD-10-CM | POA: Insufficient documentation

## 2018-07-23 DIAGNOSIS — H42 Glaucoma in diseases classified elsewhere: Secondary | ICD-10-CM | POA: Insufficient documentation

## 2018-07-23 DIAGNOSIS — E1152 Type 2 diabetes mellitus with diabetic peripheral angiopathy with gangrene: Secondary | ICD-10-CM | POA: Insufficient documentation

## 2018-07-23 DIAGNOSIS — E1122 Type 2 diabetes mellitus with diabetic chronic kidney disease: Secondary | ICD-10-CM | POA: Insufficient documentation

## 2018-07-23 DIAGNOSIS — E1139 Type 2 diabetes mellitus with other diabetic ophthalmic complication: Secondary | ICD-10-CM | POA: Insufficient documentation

## 2018-07-23 DIAGNOSIS — E785 Hyperlipidemia, unspecified: Secondary | ICD-10-CM | POA: Insufficient documentation

## 2018-07-23 DIAGNOSIS — I96 Gangrene, not elsewhere classified: Secondary | ICD-10-CM | POA: Insufficient documentation

## 2018-07-23 DIAGNOSIS — N189 Chronic kidney disease, unspecified: Secondary | ICD-10-CM | POA: Insufficient documentation

## 2018-07-23 DIAGNOSIS — Z8673 Personal history of transient ischemic attack (TIA), and cerebral infarction without residual deficits: Secondary | ICD-10-CM | POA: Insufficient documentation

## 2018-07-23 HISTORY — PX: AMPUTATION TOE: SHX6595

## 2018-07-23 LAB — POCT I-STAT 4, (NA,K, GLUC, HGB,HCT)
Glucose, Bld: 108 mg/dL — ABNORMAL HIGH (ref 70–99)
HCT: 33 % — ABNORMAL LOW (ref 39.0–52.0)
Hemoglobin: 11.2 g/dL — ABNORMAL LOW (ref 13.0–17.0)
Potassium: 3.6 mmol/L (ref 3.5–5.1)
Sodium: 139 mmol/L (ref 135–145)

## 2018-07-23 LAB — GLUCOSE, CAPILLARY
Glucose-Capillary: 92 mg/dL (ref 70–99)
Glucose-Capillary: 76 mg/dL (ref 70–99)

## 2018-07-23 SURGERY — AMPUTATION, TOE
Anesthesia: General | Site: Foot | Laterality: Bilateral

## 2018-07-23 MED ORDER — ONDANSETRON HCL 4 MG/2ML IJ SOLN: 4.0000 mg | Freq: Four times a day (QID) | INTRAMUSCULAR | Status: DC | PRN

## 2018-07-23 MED ORDER — PROPOFOL 10 MG/ML IV BOLUS
INTRAVENOUS | Status: AC
  Filled 2018-07-23: qty 20

## 2018-07-23 MED ORDER — OXYCODONE-ACETAMINOPHEN 5-325 MG PO TABS: 1.0000 | ORAL_TABLET | Freq: Four times a day (QID) | ORAL | 0 refills | Status: DC | PRN

## 2018-07-23 MED ORDER — SODIUM CHLORIDE 0.9 % IV SOLN
INTRAVENOUS | Status: DC
  Administered 2018-07-23: 09:00:00 via INTRAVENOUS

## 2018-07-23 MED ORDER — ONDANSETRON HCL 4 MG/2ML IJ SOLN: 4.0000 mg | Freq: Once | INTRAMUSCULAR | Status: DC | PRN

## 2018-07-23 MED ORDER — CLINDAMYCIN PHOSPHATE 300 MG/50ML IV SOLN
INTRAVENOUS | Status: DC | PRN
  Administered 2018-07-23: 300 mg via INTRAVENOUS

## 2018-07-23 MED ORDER — ONDANSETRON HCL 4 MG/2ML IJ SOLN
INTRAMUSCULAR | Status: DC | PRN
  Administered 2018-07-23: 4 mg via INTRAVENOUS

## 2018-07-23 MED ORDER — FAMOTIDINE 20 MG PO TABS
ORAL_TABLET | ORAL | Status: AC
  Filled 2018-07-23: qty 1

## 2018-07-23 MED ORDER — LIDOCAINE HCL (CARDIAC) PF 100 MG/5ML IV SOSY
PREFILLED_SYRINGE | INTRAVENOUS | Status: DC | PRN
  Administered 2018-07-23: 80 mg via INTRAVENOUS

## 2018-07-23 MED ORDER — LIDOCAINE HCL (PF) 1 % IJ SOLN
INTRAMUSCULAR | Status: DC | PRN
  Administered 2018-07-23: 10 mL

## 2018-07-23 MED ORDER — FAMOTIDINE 20 MG PO TABS
20.0000 mg | ORAL_TABLET | Freq: Once | ORAL | Status: AC
  Administered 2018-07-23: 20 mg via ORAL

## 2018-07-23 MED ORDER — FENTANYL CITRATE (PF) 100 MCG/2ML IJ SOLN: 25.0000 ug | INTRAMUSCULAR | Status: DC | PRN

## 2018-07-23 MED ORDER — ONDANSETRON HCL 4 MG PO TABS: 4.0000 mg | ORAL_TABLET | Freq: Four times a day (QID) | ORAL | Status: DC | PRN

## 2018-07-23 MED ORDER — PROPOFOL 10 MG/ML IV BOLUS
INTRAVENOUS | Status: DC | PRN
  Administered 2018-07-23: 100 mg via INTRAVENOUS
  Administered 2018-07-23: 30 mg via INTRAVENOUS

## 2018-07-23 MED ORDER — CLINDAMYCIN PHOSPHATE 600 MG/50ML IV SOLN
INTRAVENOUS | Status: AC
  Filled 2018-07-23: qty 50

## 2018-07-23 MED ORDER — PHENYLEPHRINE HCL 10 MG/ML IJ SOLN
INTRAMUSCULAR | Status: DC | PRN
  Administered 2018-07-23 (×3): 100 ug via INTRAVENOUS

## 2018-07-23 MED ORDER — BUPIVACAINE HCL 0.5 % IJ SOLN
INTRAMUSCULAR | Status: DC | PRN
  Administered 2018-07-23: 20 mL
  Administered 2018-07-23: 10 mL

## 2018-07-23 MED ORDER — OXYCODONE-ACETAMINOPHEN 5-325 MG PO TABS: 1.0000 | ORAL_TABLET | Freq: Four times a day (QID) | ORAL | Status: DC | PRN

## 2018-07-23 SURGICAL SUPPLY — 43 items
BANDAGE ELASTIC 4 LF NS (GAUZE/BANDAGES/DRESSINGS) ×2 IMPLANT
BLADE OSC/SAGITTAL MD 5.5X18 (BLADE) ×2 IMPLANT
BLADE SURG MINI STRL (BLADE) ×2 IMPLANT
BNDG CONFORM 2 STRL LF (GAUZE/BANDAGES/DRESSINGS) ×2 IMPLANT
BNDG CONFORM 3 STRL LF (GAUZE/BANDAGES/DRESSINGS) ×4 IMPLANT
BNDG ESMARK 4X12 TAN STRL LF (GAUZE/BANDAGES/DRESSINGS) ×2 IMPLANT
BNDG GAUZE 4.5X4.1 6PLY STRL (MISCELLANEOUS) ×4 IMPLANT
CANISTER SUCT 1200ML W/VALVE (MISCELLANEOUS) ×2 IMPLANT
COVER WAND RF STERILE (DRAPES) IMPLANT
DRAPE FLUOR MINI C-ARM 54X84 (DRAPES) IMPLANT
DRAPE IMP U-DRAPE 54X76 (DRAPES) ×2 IMPLANT
DRAPE XRAY CASSETTE 23X24 (DRAPES) IMPLANT
DURAPREP 26ML APPLICATOR (WOUND CARE) ×4 IMPLANT
ELECT REM PT RETURN 9FT ADLT (ELECTROSURGICAL) ×2
ELECTRODE REM PT RTRN 9FT ADLT (ELECTROSURGICAL) ×1 IMPLANT
GAUZE PACKING IODOFORM 1/2 (PACKING) IMPLANT
GAUZE PETRO XEROFOAM 1X8 (MISCELLANEOUS) ×4 IMPLANT
GAUZE SPONGE 4X4 12PLY STRL (GAUZE/BANDAGES/DRESSINGS) ×4 IMPLANT
GLOVE BIO SURGEON STRL SZ7.5 (GLOVE) ×2 IMPLANT
GLOVE INDICATOR 8.0 STRL GRN (GLOVE) ×2 IMPLANT
GOWN STRL REUS W/ TWL LRG LVL3 (GOWN DISPOSABLE) ×2 IMPLANT
GOWN STRL REUS W/TWL LRG LVL3 (GOWN DISPOSABLE) ×2
HEMOSTAT SURGICEL 2X3 (HEMOSTASIS) ×2 IMPLANT
KIT TURNOVER KIT A (KITS) ×2 IMPLANT
LABEL OR SOLS (LABEL) ×2 IMPLANT
NEEDLE FILTER BLUNT 18X 1/2SAF (NEEDLE) ×1
NEEDLE FILTER BLUNT 18X1 1/2 (NEEDLE) ×1 IMPLANT
NEEDLE HYPO 25X1 1.5 SAFETY (NEEDLE) ×2 IMPLANT
NS IRRIG 500ML POUR BTL (IV SOLUTION) ×4 IMPLANT
PACK EXTREMITY ARMC (MISCELLANEOUS) ×2 IMPLANT
PAD ABD DERMACEA PRESS 5X9 (GAUZE/BANDAGES/DRESSINGS) ×4 IMPLANT
PULSAVAC PLUS IRRIG FAN TIP (DISPOSABLE)
SHIELD FULL FACE ANTIFOG 7M (MISCELLANEOUS) ×2 IMPLANT
SOL .9 NS 3000ML IRR  AL (IV SOLUTION) ×1
SOL .9 NS 3000ML IRR UROMATIC (IV SOLUTION) ×1 IMPLANT
STOCKINETTE M/LG 89821 (MISCELLANEOUS) ×4 IMPLANT
STRAP SAFETY 5IN WIDE (MISCELLANEOUS) ×2 IMPLANT
SUT ETHILON 3-0 FS-10 30 BLK (SUTURE) ×6
SUT ETHILON 5-0 FS-2 18 BLK (SUTURE) ×2 IMPLANT
SUT VIC AB 4-0 FS2 27 (SUTURE) ×2 IMPLANT
SUTURE EHLN 3-0 FS-10 30 BLK (SUTURE) ×3 IMPLANT
SYR 10ML LL (SYRINGE) ×6 IMPLANT
TIP FAN IRRIG PULSAVAC PLUS (DISPOSABLE) IMPLANT

## 2018-07-23 NOTE — Transfer of Care (Signed)
Immediate Anesthesia Transfer of Care Note  Patient: Jose [name]  Procedure(s) Performed: TOE MPJ RIGHT 3RD AND LEFT 2ND (Bilateral Foot)  Patient Location: PACU  Anesthesia Type:General  Level of Consciousness: awake  Airway & Oxygen Therapy: Patient Spontanous Breathing  Post-op Assessment: Report given to RN  Post vital signs: stable  Last Vitals:  Vitals Value Taken Time  BP 137/72 07/23/2018 10:08 AM  Temp 36.3 C 07/23/2018 10:08 AM  Pulse 82 07/23/2018 10:14 AM  Resp 15 07/23/2018 10:14 AM  SpO2 92 % 07/23/2018 10:14 AM  Vitals shown include unvalidated device data.  Last Pain:  Vitals:   07/23/18 0828  TempSrc: Temporal  PainSc: 10-Worst pain ever         Complications: No apparent anesthesia complications

## 2018-07-23 NOTE — H&P (Signed)
HISTORY AND PHYSICAL INTERVAL NOTE:  07/23/2018  8:36 AM  Jose [name]  has presented today for surgery, with the diagnosis of I96 - GANGRENE TOES BOTH FEET.  The various methods of treatment have been discussed with the patient.  No guarantees were given.  After consideration of risks, benefits and other options for treatment, the patient has consented to surgery.  I have reviewed the patients' chart and labs.     A history and physical examination was performed in my office.  The patient was reexamined.  There have been no changes to this history and physical examination.  Samara Deist A

## 2018-07-23 NOTE — Anesthesia Procedure Notes (Signed)
Procedure Name: LMA Insertion Date/Time: 07/23/2018 9:15 AM Performed by: Zetta Bills, CRNA Pre-anesthesia Checklist: Patient identified, Emergency Drugs available, Suction available and Patient being monitored Patient Re-evaluated:Patient Re-evaluated prior to induction Oxygen Delivery Method: Circle system utilized Preoxygenation: Pre-oxygenation with 100% oxygen Induction Type: IV induction LMA: LMA inserted LMA Size: 4.0 Number of attempts: 2 Tube secured with: Tape Dental Injury: Teeth and Oropharynx as per pre-operative assessment

## 2018-07-23 NOTE — Anesthesia Post-op Follow-up Note (Signed)
Anesthesia QCDR form completed.        

## 2018-07-23 NOTE — Anesthesia Postprocedure Evaluation (Signed)
Anesthesia Post Note  Patient: Jose [name]  Procedure(s) Performed: TOE MPJ RIGHT 3RD AND LEFT 2ND (Bilateral Foot)  Patient location during evaluation: PACU Anesthesia Type: General Level of consciousness: awake and alert and oriented Pain management: pain level controlled Vital Signs Assessment: post-procedure vital signs reviewed and stable Respiratory status: spontaneous breathing, nonlabored ventilation and respiratory function stable Cardiovascular status: blood pressure returned to baseline and stable Postop Assessment: no signs of nausea or vomiting Anesthetic complications: no     Last Vitals:  Vitals:   07/23/18 1053 07/23/18 1137  BP: (!) 147/78 (!) 150/79  Pulse: 64 68  Resp: 16 18  Temp:  36.7 C  SpO2: 94% 98%    Last Pain:  Vitals:   07/23/18 1137  TempSrc: Temporal  PainSc: 0-No pain                 Mikle Sternberg

## 2018-07-23 NOTE — Discharge Instructions (Signed)
[facility] °[facility] ° °POST OPERATIVE INSTRUCTIONS FOR DR. TROXLER AND DR. Thoams Siefert °[facility] ° ° °1. Take your medication as prescribed.  Pain medication should be taken only as needed. ° °2. Keep the dressing clean, dry and intact. ° °3. Keep your foot elevated above the heart level for the first 48 hours. ° °4. Walking to the bathroom and brief periods of walking are acceptable, unless we have instructed you to be non-weight bearing. ° °5. Always wear your post-op shoe when walking.  Always use your crutches if you are to be non-weight bearing. ° °6. Do not take a shower. Baths are permissible as long as the foot is kept out of the water.  ° °7. Every hour you are awake:  °- Bend your knee 15 times. °- Flex foot 15 times °- Massage calf 15 times ° °8. Call [facility] ([phone]) if any of the following problems occur: °- You develop a temperature or fever. °- The bandage becomes saturated with blood. °- Medication does not stop your pain. °- Injury of the foot occurs. °- Any symptoms of infection including redness, odor, or red streaks running from wound. °-  ° °

## 2018-07-23 NOTE — Op Note (Signed)
Operative note   Surgeon:Jayin Derousse Lawyer: None    Preop diagnosis: 1.  Left second toe gangrene 2.  Right third toe gangrene    Postop diagnosis: Same    Procedure: 1.  Left second toe amputation MTPJ 2.  Right third toe amputation MTPJ    EBL: Minimal    Anesthesia:local and general    Hemostasis: None    Specimen: Gangrene right third toe and left second toe    Complications: None    Operative indications:Jose [name] is an 73 y.o. that presents today for surgical intervention.  The risks/benefits/alternatives/complications have been discussed and consent has been given.    Procedure:  Patient was brought into the OR and placed on the operating table in thesupine position. After anesthesia was obtained the bilateral lower extremity was prepped and draped in usual sterile fashion.  Attention was initially directed to the right third toe where the gangrenous changes were noted plantarly to the base of the third toe.  At this time 2 semielliptical incisions were made at the base of the third toe.  Full-thickness incisions were performed down to the metatarsophalangeal joint.  The toe was disarticulated at the metatarsophalangeal joint and sent for pathological examination.  Bleeders were Bovie cauterized appropriately.  The wound was flushed with copious amounts of irrigation.  Closure was then performed with a 3-0 nylon.  Attention was then directed to the left second toe where the gangrenous changes plantarly were noted to the base of the toe.  2 semielliptical incisions were made full-thickness in nature.  The toe was disarticulated at the metatarsophalangeal joint.  This was sent for pathological examination.  All bleeders were Bovie cauterized.  The wound was flushed with copious amounts of irrigation.  Closure was then performed with a 3-0 nylon.  Sterile dressings were applied to both feet.    Patient tolerated the procedure and anesthesia well.  Was transported  from the OR to the PACU with all vital signs stable and vascular status intact. To be discharged per routine protocol.  Will follow up in approximately 1 week in the outpatient clinic.

## 2018-07-23 NOTE — Anesthesia Preprocedure Evaluation (Signed)
Anesthesia Evaluation  Patient identified by MRN, date of birth, ID band Patient awake    Reviewed: Allergy & Precautions, NPO status , Patient's Chart, lab work & pertinent test results  History of Anesthesia Complications Negative for: history of anesthetic complications  Airway Mallampati: II  TM Distance: >3 FB Neck ROM: Full    Dental  (+) Poor Dentition, Missing   Pulmonary neg sleep apnea, neg COPD, Current Smoker,    breath sounds clear to auscultation- rhonchi (-) wheezing      Cardiovascular hypertension, Pt. on medications (-) CAD, (-) Past MI, (-) Cardiac Stents and (-) CABG  Rhythm:Regular Rate:Normal - Systolic murmurs and - Diastolic murmurs    Neuro/Psych PSYCHIATRIC DISORDERS Depression CVA, No Residual Symptoms    GI/Hepatic negative GI ROS, Neg liver ROS,   Endo/Other  diabetes, Oral Hypoglycemic Agents  Renal/GU Dialysis and ESRFRenal disease     Musculoskeletal negative musculoskeletal ROS (+)   Abdominal (+) - obese,   Peds  Hematology negative hematology ROS (+)   Anesthesia Other Findings Past Medical History: No date: Chronic kidney disease No date: Depression No date: Diabetes mellitus without complication (HCC) No date: Glaucoma No date: Glaucoma No date: Gout No date: Heart murmur No date: HLD (hyperlipidemia) No date: Hypertension No date: Prostate cancer (HCC) No date: Renal failure     Comment:  dialysis t/t/s No date: Stroke ([place])     Comment:  tia x 2   Reproductive/Obstetrics                             Anesthesia Physical Anesthesia Plan  ASA: IV  Anesthesia Plan: General   Post-op Pain Management:    Induction: Intravenous  PONV Risk Score and Plan: 0 and Ondansetron  Airway Management Planned: LMA  Additional Equipment:   Intra-op Plan:   Post-operative Plan:   Informed Consent: I have reviewed the patients History and  Physical, chart, labs and discussed the procedure including the risks, benefits and alternatives for the proposed anesthesia with the patient or authorized representative who has indicated his/her understanding and acceptance.     Dental advisory given  Plan Discussed with: CRNA and Anesthesiologist  Anesthesia Plan Comments:         Anesthesia Quick Evaluation

## 2018-07-26 LAB — SURGICAL PATHOLOGY

## 2018-08-04 ENCOUNTER — Telehealth (INDEPENDENT_AMBULATORY_CARE_PROVIDER_SITE_OTHER): Payer: Self-pay

## 2018-08-04 NOTE — Telephone Encounter (Signed)
Mark (PTA) from [facility] called to inform patient bp was 165/85 and the patient had toe amputation on each foot he notice more gangrene or necrosis around surrounding toes.The patient was not able to get to appointment with Vickki Muff but [name] did call and reschedule with [facility] office.I inform him that he should call patient pcp about blood pressure.

## 2018-08-09 ENCOUNTER — Telehealth (INDEPENDENT_AMBULATORY_CARE_PROVIDER_SITE_OTHER): Payer: Self-pay | Admitting: Vascular Surgery

## 2018-08-09 NOTE — Telephone Encounter (Signed)
Let them know that we too have a difficult time reaching Mr. Burningham at times.  He should continue to reach out to the patient.

## 2018-08-09 NOTE — Telephone Encounter (Signed)
Suezanne Jacquet says that Jose [name] was getting worse with some discoloration. He has encouraged patient to reach out to our clinic to be seen. Can you please call this patient to schedule with Dew? AS, CMA

## 2018-08-09 NOTE — Telephone Encounter (Signed)
Ben-Physical Therapist with [facility] call Friday stating that he had tried to go out to patients house on Wednesday and Friday of last week and was unable to reach patient. No answer at the door. Stated he was going back out this week to try again. AS, CMA

## 2018-08-13 ENCOUNTER — Telehealth (INDEPENDENT_AMBULATORY_CARE_PROVIDER_SITE_OTHER): Payer: Self-pay | Admitting: Vascular Surgery

## 2018-08-13 NOTE — Telephone Encounter (Signed)
Erica from [facility] called stating that she had gone to see patient today and that his 3rd toe on the left foot looks bruised with a blister.  Patient has apt with you next Friday 08/20/18. AS,CMA

## 2018-08-20 ENCOUNTER — Encounter (INDEPENDENT_AMBULATORY_CARE_PROVIDER_SITE_OTHER): Payer: Self-pay | Admitting: Vascular Surgery

## 2018-08-20 ENCOUNTER — Ambulatory Visit (INDEPENDENT_AMBULATORY_CARE_PROVIDER_SITE_OTHER): Payer: Medicare Other | Admitting: Vascular Surgery

## 2018-08-20 ENCOUNTER — Other Ambulatory Visit: Payer: Self-pay

## 2018-08-20 ENCOUNTER — Encounter (INDEPENDENT_AMBULATORY_CARE_PROVIDER_SITE_OTHER): Payer: Self-pay

## 2018-08-20 ENCOUNTER — Telehealth (INDEPENDENT_AMBULATORY_CARE_PROVIDER_SITE_OTHER): Payer: Self-pay

## 2018-08-20 ENCOUNTER — Ambulatory Visit (INDEPENDENT_AMBULATORY_CARE_PROVIDER_SITE_OTHER): Payer: Medicare Other

## 2018-08-20 VITALS — BP 139/63 | HR 75 | Resp 16 | Wt 157.8 lb

## 2018-08-20 DIAGNOSIS — N186 End stage renal disease: Secondary | ICD-10-CM | POA: Diagnosis not present

## 2018-08-20 DIAGNOSIS — I120 Hypertensive chronic kidney disease with stage 5 chronic kidney disease or end stage renal disease: Secondary | ICD-10-CM | POA: Diagnosis not present

## 2018-08-20 DIAGNOSIS — I70261 Atherosclerosis of native arteries of extremities with gangrene, right leg: Secondary | ICD-10-CM

## 2018-08-20 DIAGNOSIS — Z992 Dependence on renal dialysis: Secondary | ICD-10-CM | POA: Diagnosis not present

## 2018-08-20 DIAGNOSIS — E1122 Type 2 diabetes mellitus with diabetic chronic kidney disease: Secondary | ICD-10-CM | POA: Diagnosis not present

## 2018-08-20 DIAGNOSIS — Z79899 Other long term (current) drug therapy: Secondary | ICD-10-CM

## 2018-08-20 DIAGNOSIS — F17200 Nicotine dependence, unspecified, uncomplicated: Secondary | ICD-10-CM

## 2018-08-20 DIAGNOSIS — I10 Essential (primary) hypertension: Secondary | ICD-10-CM

## 2018-08-20 NOTE — Patient Instructions (Signed)
Angiogram  An angiogram is a procedure used to examine the blood vessels. In this procedure, contrast dye is injected through a long, thin tube (catheter) into an artery. X-rays are then taken, which show if there is a blockage or problem in a blood vessel. The catheter may be inserted in:  Your groin area. This is the most common.  The fold of your arm, near your elbow.  Your wrist. Tell a health care provider about:  Any allergies you have, including allergies to shellfish or contrast dye.  All medicines you are taking, including vitamins, herbs, eye drops, creams, and over-the-counter medicines.  Any problems you or family members have had with anesthetic medicines.  Any blood disorders you have.  Any surgeries you have had.  Any previous kidney problems or failure you have had.  Any medical conditions you have.  Whether you are pregnant or may be pregnant.  Whether you are breastfeeding. What are the risks? Generally, this is a safe procedure. However, problems may occur, including:  Infection or bruising at the catheter area.  Damage to other structures or organs, including rupture of blood vessels or damage to arteries.  Allergic reaction to the contrast dye used.  Kidney damage from the contrast dye used.  Blood clots that can lead to a stroke or heart attack. What happens before the procedure? Staying hydrated Follow instructions from your health care provider about hydration, which may include:  Up to 2 hours before the procedure - you may continue to drink clear liquids, such as water, clear fruit juice, black coffee, and plain tea. Eating and drinking restrictions Follow instructions from your health care provider about eating and drinking, which may include:  8 hours before the procedure - stop eating heavy meals or foods such as meat, fried foods, or fatty foods.  6 hours before the procedure - stop eating light meals or foods, such as toast or cereal.   6 hours before the procedure - stop drinking milk or drinks that contain milk.  2 hours before the procedure - stop drinking clear liquids. General instructions  Ask your health care provider about: ? Changing or stopping your normal medicines. This is important if you take diabetes medicines or blood thinners. ? Taking medicines such as aspirin and ibuprofen. These medicines can thin your blood. Do not take these medicines before your procedure if your doctor tells you not to.  You may have blood samples taken.  Plan to have someone take you home from the hospital or clinic.  If you will be going home right after the procedure, plan to have someone with you for 24 hours. What happens during the procedure?  To reduce your risk of infection: ? Your health care team will wash or sanitize their hands. ? Your skin will be washed with soap. ? Hair may be removed from the insertion area.  You will lie on your back on an X-ray table. You may be strapped to the table if it is tilted.  An IV tube will be inserted into one of your veins.  Electrodes may be placed on your chest to monitor your heart rate during the procedure.  You will be given one or more of the following: ? A medicine to help you relax (sedative). ? A medicine to numb the area where the catheter will be inserted (local anesthetic).  The catheter will be inserted into an artery using a guide wire. A type of X-ray (fluoroscopy) will be used to help  guide the catheter to the blood vessel to be examined.  A contrast dye will then be injected into the catheter, and X-rays will be taken. The contrast will help to show where any narrowing or blockages are located in the blood vessels. You may feel flushed as the contrast dye is injected.  After the X-ray is complete, the catheter will be removed.  A bandage (dressing) will be placed over the site where the catheter was inserted. Pressure will be applied to help stop any  bleeding. The procedure may vary among health care providers and hospitals. What happens after the procedure?  Your blood pressure, heart rate, breathing rate, and blood oxygen level will be monitored until the medicines you were given have worn off.  You will be kept in bed lying flat for several hours. If the catheter was inserted through your leg, you will be instructed not to bend or cross your legs.  The insertion area and the pulse in your feet or wrist will be checked frequently.  You will be instructed to drink plenty of fluids. This will help wash the contrast dye out of your body.  Additional blood tests and X-rays may be done.  Tests to check the electrical activity in your heart (electrocardiogram) may be done.  Do not drive for 24 hours if you received a sedative.  It is up to you to get the results of your procedure. Ask your health care provider, or the department that is doing the procedure, when your results will be ready. Summary  An angiogram is a procedure used to examine the blood vessels.  In this procedure, contrast dye is injected through a long, thin tube (catheter) into an artery. X-rays are then taken.  Before the procedure, follow your health care provider's instructions about eating and drinking restrictions. You may be asked to stop eating and drinking several hours before the procedure.  After the procedure, you will need to lie flat for several hours and drink plenty of fluids. This information is not intended to replace advice given to you by your health care provider. Make sure you discuss any questions you have with your health care provider. Document Released: 02/26/2005 Document Revised: 09/23/2016 Document Reviewed: 06/25/2016 Elsevier Interactive Patient Education  2019 [employer].

## 2018-08-20 NOTE — Telephone Encounter (Signed)
Spoke with the patient's son and scheduled the patient for his procedure with Dr. Lucky Cowboy for 08/25/2018 with a 11:30 am arrival time, I will call the son on Monday with the patient's pre-op day and time. The patient was given pre-procedure instructions today.

## 2018-08-20 NOTE — Assessment & Plan Note (Addendum)
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  His first and second digits that remain on the right foot appear viable.  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 He is studied with noninvasive studies today which show a right ABI of 1.03 and a left ABI of 0.76.  Although his waveforms at the ankle appear to be pretty good, his digit pressures are reduced bilaterally.  His digit pressure is essentially undetectable on the right but he has had multiple digital amputations.  On the left the digit pressure was 45 in the great toe. This is clearly a critical and limb threatening situation.  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  Given this critical and limb threatening situation, angiogram of the left lower extremity would be prudent at this point.  We may have to repeat an angiogram on the right as well going forward, but the perfusion in terms of ABIs is significantly lower on the left now and he has dry gangrenous changes to the left third toe.  This cannot wait several weeks and I will try to get him on the schedule for next week.

## 2018-08-20 NOTE — Progress Notes (Signed)
MRN : [id number]  Jose [name] is a 73 y.o. (Oct 15, 1945) male who presents with chief complaint of  Chief Complaint  Patient presents with  . Follow-up    abi ultrasound follow up  .  History of Present Illness: Patient returns today in follow up of his PAD.  He has multiple ongoing issues.  He has not yet used his left arm AV fistula which looks well mature.  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  His first and second digits that remain on the right foot appear viable.  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  He still complains of pain in both feet that wake him at night.  The left foot may now be a little worse than the right.  Previously the right foot was much more painful.  He has some swelling in both legs.  He does not have fever or chills.  He asks if we can give him something for pain today.  He is studied with noninvasive studies today which show a right ABI of 1.03 and a left ABI of 0.76.  Although his waveforms at the ankle appear to be pretty good, his digit pressures are reduced bilaterally.  Current Outpatient Medications  Medication Sig Dispense Refill  . allopurinol (ZYLOPRIM) 100 MG tablet Take 100 mg by mouth 2 (two) times daily.     Marland Kitchen amLODipine (NORVASC) 10 MG tablet Take 10 mg by mouth daily.     Marland Kitchen aspirin EC 81 MG EC tablet Take 1 tablet (81 mg total) by mouth daily.    . cloNIDine (CATAPRES) 0.2 MG tablet TAKE 1 TABLET BY MOUTH TWICE A DAY 60 tablet 0  . clopidogrel (PLAVIX) 75 MG tablet Take 1 tablet (75 mg total) by mouth daily. 30 tablet 2  . escitalopram (LEXAPRO) 10 MG tablet Take 10 mg by mouth daily.     Marland Kitchen glipiZIDE (GLUCOTROL) 5 MG tablet Take by mouth daily before breakfast.    . hydrALAZINE (APRESOLINE) 25 MG tablet Take 1 tablet (25 mg total) by mouth every 8 (eight) hours. 90 tablet 0  . multivitamin (RENA-VIT) TABS tablet Take 1 tablet  by mouth at bedtime. 30 tablet 0  . Nutritional Supplements (FEEDING SUPPLEMENT, NEPRO CARB STEADY,) LIQD Take 237 mLs by mouth 2 (two) times daily between meals. 60 Can 0  . oxyCODONE-acetaminophen (PERCOCET) 5-325 MG tablet Take 1-2 tablets by mouth every 6 (six) hours as needed for severe pain. 25 tablet 0   No current facility-administered medications for this visit.     Past Medical History:  Diagnosis Date  . Chronic kidney disease   . Depression   . Diabetes mellitus without complication ([place])   . Glaucoma   . Glaucoma   . Gout   . Heart murmur   . HLD (hyperlipidemia)   . Hypertension   . Prostate cancer ([place])   . Renal failure    dialysis t/t/s  . Stroke ([place])    tia x 2    Past Surgical History:  Procedure Laterality Date  . AMPUTATION TOE Bilateral 07/23/2018   Procedure: TOE MPJ RIGHT 3RD AND LEFT 2ND;  Surgeon: Samara Deist, DPM;  Location: [facility] ORS;  Service: Podiatry;  Laterality: Bilateral;  . APPENDECTOMY    . AV FISTULA PLACEMENT Left 06/09/2018   Procedure: ARTERIOVENOUS (AV) FISTULA CREATION ( BRACHIAL CEPHALIC);  Surgeon: Algernon Huxley, MD;  Location: [facility] ORS;  Service: Vascular;  Laterality: Left;  . CHOLECYSTECTOMY    . DIALYSIS/PERMA CATHETER INSERTION N/A 03/24/2018   Procedure: DIALYSIS/PERMA CATHETER INSERTION;  Surgeon: Katha Cabal, MD;  Location: [facility];  Service: Cardiovascular;  Laterality: N/A;  . INSERTION PROSTATE RADIATION SEED    . LOWER EXTREMITY ANGIOGRAPHY Right 06/16/2018   Procedure: LOWER EXTREMITY ANGIOGRAPHY;  Surgeon: Algernon Huxley, MD;  Location: [facility];  Service: Cardiovascular;  Laterality: Right;   FamilyHistory No bleeding disorders, clotting disorders, porphyria, or aneurysms   Social History        Tobacco Use  . Smoking status: Current Every Day Smoker  . Smokeless tobacco: Never Used  Substance Use Topics  . Alcohol use: Never    Alcohol/week: 0.0 standard drinks     Frequency: Never  . Drug use: Never  [place]         Allergies  Allergen Reactions  . Penicillin G Other (See Comments)    Other reaction(s): Unknown DID THE REACTION INVOLVE: Swelling of the face/tongue/throat, SOB, or low BP? Unknown Sudden or severe rash/hives, skin peeling, or the inside of the mouth or nose? Unknown Did it require medical treatment? Unknown When did it last happen?unknown If all above answers are "NO", may proceed with cephalosporin use.    REVIEW OF SYSTEMS(Negative unless checked)  Constitutional: [] ??Weight loss[] ??Fever[] ??Chills Cardiac:[] ??Chest pain[] ??Chest pressure[] ??Palpitations [] ??Shortness of breath when laying flat [] ??Shortness of breath at rest [] ??Shortness of breath with exertion. Vascular: [] ??Pain in legs with walking[] ??Pain in legsat rest[] ??Pain in legs when laying flat [] ??Claudication [] ??Pain in feet when walking [] ??Pain in feet at rest [] ??Pain in feet when laying flat [] ??History of DVT [] ??Phlebitis [] ??Swelling in legs [] ??Varicose veins [] ??Non-healing ulcers Pulmonary: [] ??Uses home oxygen [] ??Productive cough[] ??Hemoptysis [] ??Wheeze [] ??COPD [] ??Asthma Neurologic: [] ??Dizziness [] ??Blackouts [] ??Seizures [] ??History of stroke [] ??History of TIA[] ??Aphasia [] ??Temporary blindness[] ??Dysphagia [] ??Weaknessor numbness in arms [] ??Weakness or numbnessin legs Musculoskeletal: [x] ??Arthritis [] ??Joint swelling [] ??Joint pain [] ??Low back pain Hematologic:[] ??Easy bruising[] ??Easy bleeding [] ??Hypercoagulable state [x] ??Anemic [] ??Hepatitis Gastrointestinal:[] ??Blood in stool[] ??Vomiting blood[x] ??Gastroesophageal reflux/heartburn[] ??Abdominal pain Genitourinary: [x] ??Chronic kidney disease [] ??Difficulturination [] ??Frequenturination [] ??Burning with urination[] ??Hematuria Skin: [] ??Rashes [] ??Ulcers [] ??Wounds  Psychological: [] ??History of anxiety[] ??History of major depression.    Physical Examination  BP 139/63 (BP Location: Right Arm)   Pulse 75   Resp 16   Wt 157 lb 12.8 oz (71.6 kg)   BMI 20.82 kg/m  Gen:  WD/WN, NAD Head: [place]/AT, No temporalis wasting. Ear/Nose/Throat: Hearing grossly intact, nares w/o erythema or drainage Eyes: Conjunctiva clear. Sclera non-icteric Neck: Supple.  Trachea midline Pulmonary:  Good air movement, no use of accessory muscles.  Cardiac: RRR, no JVD Vascular: Strong thrill in left brachiocephalic AV fistula with a medium size vein branch in the distal upper arm and some tortuosity of the fistula but otherwise it is well matured. Vessel Right Left  Radial Palpable Palpable                          PT  1+ palpable  1+ palpable  DP  1+ palpable  not palpable    Musculoskeletal: M/S 5/5 throughout.  2+ right lower extremity edema, 1+ left lower extremity edema. 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  His first and second digits that remain on the right foot appear viable.  His third digit on the left foot is dark and appears to be demarcating  with dry gangrene. Neurologic: Sensation grossly intact in extremities.  Symmetrical.  Speech is fluent.  Psychiatric: Judgment reasonably good.  Affect is normal. Dermatologic: Leg amputation sites on toes 3 through 5 on the right and the dry gangrenous changes of the third toe on the left foot.       Labs Recent Results (from the past 2160 hour(s))  APTT     Status: None   Collection Time: 05/28/18  2:12 PM  Result Value Ref Range   aPTT 31 24 - 36 seconds    Comment: Performed at [facility], [address] [place], [place] [address]  Basic metabolic panel     Status: Abnormal   Collection Time: 05/28/18  2:12 PM  Result Value Ref Range   Sodium 137 135 - 145 mmol/L   Potassium 3.4 (L) 3.5 - 5.1 mmol/L   Chloride 99  98 - 111 mmol/L   CO2 29 22 - 32 mmol/L   Glucose, Bld 94 70 - 99 mg/dL   BUN 33 (H) 8 - 23 mg/dL   Creatinine, Ser 4.62 (H) 0.61 - 1.24 mg/dL   Calcium 8.7 (L) 8.9 - 10.3 mg/dL   GFR calc non Af Amer 12 (L) >60 mL/min   GFR calc Af Amer 14 (L) >60 mL/min   Anion gap 9 5 - 15    Comment: Performed at [facility], [address] [place], [place] [address]  CBC WITH DIFFERENTIAL     Status: Abnormal   Collection Time: 05/28/18  2:12 PM  Result Value Ref Range   WBC 8.6 4.0 - 10.5 K/uL   RBC 3.13 (L) 4.22 - 5.81 MIL/uL   Hemoglobin 10.0 (L) 13.0 - 17.0 g/dL   HCT 30.8 (L) 39.0 - 52.0 %   MCV 98.4 80.0 - 100.0 fL   MCH 31.9 26.0 - 34.0 pg   MCHC 32.5 30.0 - 36.0 g/dL   RDW 14.7 11.5 - 15.5 %   Platelets 153 150 - 400 K/uL   nRBC 0.0 0.0 - 0.2 %   Neutrophils Relative % 49 %   Neutro Abs 4.3 1.7 - 7.7 K/uL   Lymphocytes Relative 29 %   Lymphs Abs 2.5 0.7 - 4.0 K/uL   Monocytes Relative 11 %   Monocytes Absolute 0.9 0.1 - 1.0 K/uL   Eosinophils Relative 10 %   Eosinophils Absolute 0.9 (H) 0.0 - 0.5 K/uL   Basophils Relative 1 %   Basophils Absolute 0.1 0.0 - 0.1 K/uL   Immature Granulocytes 0 %   Abs Immature Granulocytes 0.02 0.00 - 0.07 K/uL    Comment: Performed at [facility], [address] [place], [place] [address]  Protime-INR     Status: None   Collection Time: 05/28/18  2:12 PM  Result Value Ref Range   Prothrombin Time 13.3 11.4 - 15.2 seconds   INR 1.02     Comment: Performed at [facility], [address] [place], [place] [address]  Type and screen     Status: None   Collection Time: 05/28/18  2:12 PM  Result Value Ref Range   ABO/RH(D) A POS    Antibody Screen NEG    Sample Expiration 06/11/2018    Extend sample reason      NO TRANSFUSIONS OR PREGNANCY IN THE PAST 3 MONTHS Performed at [facility], [address] [place], [place] [address]   Potassium     Status: None   Collection Time: 06/08/18 11:30  AM   Result Value Ref Range   Potassium 3.9 3.5 - 5.1 mmol/L    Comment: Performed at [facility], [address] [place], [place] [address]  Glucose, capillary     Status: None   Collection Time: 06/09/18 10:04 AM  Result Value Ref Range   Glucose-Capillary 95 70 - 99 mg/dL  I-STAT 4, (NA,K, GLUC, HGB,HCT)     Status: Abnormal   Collection Time: 06/09/18 10:41 AM  Result Value Ref Range   Sodium 142 135 - 145 mmol/L   Potassium 3.4 (L) 3.5 - 5.1 mmol/L   Glucose, Bld 91 70 - 99 mg/dL   HCT 32.0 (L) 39.0 - 52.0 %   Hemoglobin 10.9 (L) 13.0 - 17.0 g/dL  ABO/Rh     Status: None   Collection Time: 06/09/18 10:44 AM  Result Value Ref Range   ABO/RH(D)      A POS Performed at [facility], [address] [place], [place] [address]   Glucose, capillary     Status: None   Collection Time: 06/09/18  2:06 PM  Result Value Ref Range   Glucose-Capillary 81 70 - 99 mg/dL  Comprehensive metabolic panel     Status: Abnormal   Collection Time: 06/12/18  4:41 PM  Result Value Ref Range   Sodium 139 135 - 145 mmol/L   Potassium 3.1 (L) 3.5 - 5.1 mmol/L   Chloride 98 98 - 111 mmol/L   CO2 30 22 - 32 mmol/L   Glucose, Bld 84 70 - 99 mg/dL   BUN 13 8 - 23 mg/dL   Creatinine, Ser 2.54 (H) 0.61 - 1.24 mg/dL   Calcium 8.4 (L) 8.9 - 10.3 mg/dL   Total Protein 7.6 6.5 - 8.1 g/dL   Albumin 3.7 3.5 - 5.0 g/dL   AST 25 15 - 41 U/L   ALT 15 0 - 44 U/L   Alkaline Phosphatase 40 38 - 126 U/L   Total Bilirubin 0.3 0.3 - 1.2 mg/dL   GFR calc non Af Amer 24 (L) >60 mL/min   GFR calc Af Amer 28 (L) >60 mL/min   Anion gap 11 5 - 15    Comment: Performed at [facility], [address] [place], [place] [address]  CBC with Differential     Status: Abnormal   Collection Time: 06/12/18  4:41 PM  Result Value Ref Range   WBC 8.5 4.0 - 10.5 K/uL   RBC 3.29 (L) 4.22 - 5.81 MIL/uL   Hemoglobin 10.6 (L) 13.0 - 17.0 g/dL   HCT 32.0 (L) 39.0 - 52.0 %   MCV 97.3 80.0 - 100.0 fL    MCH 32.2 26.0 - 34.0 pg   MCHC 33.1 30.0 - 36.0 g/dL   RDW 15.0 11.5 - 15.5 %   Platelets 184 150 - 400 K/uL   nRBC 0.0 0.0 - 0.2 %   Neutrophils Relative % 52 %   Neutro Abs 4.5 1.7 - 7.7 K/uL   Lymphocytes Relative 27 %   Lymphs Abs 2.3 0.7 - 4.0 K/uL   Monocytes Relative 10 %   Monocytes Absolute 0.8 0.1 - 1.0 K/uL   Eosinophils Relative 9 %   Eosinophils Absolute 0.8 (H) 0.0 - 0.5 K/uL   Basophils Relative 1 %   Basophils Absolute 0.1 0.0 - 0.1 K/uL   Immature Granulocytes 1 %   Abs Immature Granulocytes 0.04 0.00 - 0.07 K/uL    Comment: Performed at [facility], [address] [place], [place] [address]  CG4 I-STAT (Lactic acid)     Status: None   Collection Time: 06/12/18  4:48 PM  Result Value Ref Range   Lactic Acid, Venous 1.04 0.5 - 1.9 mmol/L  Blood Culture (routine x 2)     Status: None   Collection Time: 06/13/18 12:32 AM  Result Value Ref Range   Specimen Description BLOOD RIGHT FOREARM    Special Requests      BOTTLES DRAWN AEROBIC ONLY Blood Culture adequate volume   Culture      NO GROWTH 5 DAYS Performed at [facility], [address] [place], [place] [address]    Report Status 06/18/2018 FINAL   Blood Culture (routine x 2)     Status: None   Collection Time: 06/13/18 12:32 AM  Result Value Ref Range   Specimen Description BLOOD RIGHT FOREARM UPPER    Special Requests      BOTTLES DRAWN AEROBIC AND ANAEROBIC Blood Culture results may not be optimal due to an excessive volume of blood received in culture bottles   Culture      NO GROWTH 5 DAYS Performed at [facility], [address] [place], [place] [address]    Report Status 06/18/2018 FINAL   APTT     Status: None   Collection Time: 06/13/18 12:32 AM  Result Value Ref Range   aPTT 30 24 - 36 seconds    Comment: Performed at [facility], [address] [place], [place] [address]  Protime-INR     Status: None   Collection Time: 06/13/18 12:32 AM   Result Value Ref Range   Prothrombin Time 14.6 11.4 - 15.2 seconds   INR 1.15     Comment: Performed at [facility], [address] [place], [place] [address]  Basic metabolic panel     Status: Abnormal   Collection Time: 06/13/18  5:01 AM  Result Value Ref Range   Sodium 141 135 - 145 mmol/L   Potassium 3.2 (L) 3.5 - 5.1 mmol/L   Chloride 100 98 - 111 mmol/L   CO2 31 22 - 32 mmol/L   Glucose, Bld 143 (H) 70 - 99 mg/dL   BUN 25 (H) 8 - 23 mg/dL   Creatinine, Ser 3.93 (H) 0.61 - 1.24 mg/dL    Comment: RESULTS VERIFIED BY REPEAT TESTING SNJ   Calcium 8.7 (L) 8.9 - 10.3 mg/dL   GFR calc non Af Amer 14 (L) >60 mL/min   GFR calc Af Amer 17 (L) >60 mL/min   Anion gap 10 5 - 15    Comment: Performed at [facility], [address] [place], [place] [address]  CBC     Status: Abnormal   Collection Time: 06/13/18  5:01 AM  Result Value Ref Range   WBC 6.9 4.0 - 10.5 K/uL   RBC 3.07 (L) 4.22 - 5.81 MIL/uL   Hemoglobin 9.5 (L) 13.0 - 17.0 g/dL   HCT 29.7 (L) 39.0 - 52.0 %   MCV 96.7 80.0 - 100.0 fL   MCH 30.9 26.0 - 34.0 pg   MCHC 32.0 30.0 - 36.0 g/dL   RDW 15.0 11.5 - 15.5 %   Platelets 165 150 - 400 K/uL   nRBC 0.0 0.0 - 0.2 %    Comment: Performed at [facility], [address] [place], [place] [address]  Glucose, capillary     Status: None   Collection Time: 06/13/18  7:49 AM  Result  Value Ref Range   Glucose-Capillary 85 70 - 99 mg/dL   Comment 1 Notify RN   MRSA PCR Screening     Status: None   Collection Time: 06/13/18  7:51 AM  Result Value Ref Range   MRSA by PCR NEGATIVE NEGATIVE    Comment:        The GeneXpert MRSA Assay (FDA approved for NASAL specimens only), is one component of a comprehensive MRSA colonization surveillance program. It is not intended to diagnose MRSA infection nor to guide or monitor treatment for MRSA infections. Performed at [facility], [place], [place] [address]   Heparin  level (unfractionated)     Status: None   Collection Time: 06/13/18 10:09 AM  Result Value Ref Range   Heparin Unfractionated 0.36 0.30 - 0.70 IU/mL    Comment: (NOTE) If heparin results are below expected values, and patient dosage has  been confirmed, suggest follow up testing of antithrombin III levels. Performed at [facility], [address] [place], [place] [address]   Glucose, capillary     Status: None   Collection Time: 06/13/18 11:32 AM  Result Value Ref Range   Glucose-Capillary 76 70 - 99 mg/dL  Urinalysis, Complete w Microscopic     Status: Abnormal   Collection Time: 06/13/18 11:42 AM  Result Value Ref Range   Color, Urine YELLOW (A) YELLOW   APPearance CLEAR (A) CLEAR   Specific Gravity, Urine 1.017 1.005 - 1.030   pH 6.0 5.0 - 8.0   Glucose, UA NEGATIVE NEGATIVE mg/dL   Hgb urine dipstick NEGATIVE NEGATIVE   Bilirubin Urine NEGATIVE NEGATIVE   Ketones, ur NEGATIVE NEGATIVE mg/dL   Protein, ur 100 (A) NEGATIVE mg/dL   Nitrite NEGATIVE NEGATIVE   Leukocytes, UA TRACE (A) NEGATIVE   RBC / HPF 0-5 0 - 5 RBC/hpf   WBC, UA 0-5 0 - 5 WBC/hpf   Bacteria, UA NONE SEEN NONE SEEN   Squamous Epithelial / LPF 0-5 0 - 5   Mucus PRESENT     Comment: Performed at [facility], [address] [place], [place] [address]  Glucose, capillary     Status: None   Collection Time: 06/13/18  4:52 PM  Result Value Ref Range   Glucose-Capillary 90 70 - 99 mg/dL   Comment 1 Notify RN   Heparin level (unfractionated)     Status: None   Collection Time: 06/13/18  6:07 PM  Result Value Ref Range   Heparin Unfractionated 0.43 0.30 - 0.70 IU/mL    Comment: (NOTE) If heparin results are below expected values, and patient dosage has  been confirmed, suggest follow up testing of antithrombin III levels. Performed at [facility], [address] [place], [place] [address]   Glucose, capillary     Status: Abnormal   Collection Time: 06/13/18  9:29 PM   Result Value Ref Range   Glucose-Capillary 109 (H) 70 - 99 mg/dL  CBC     Status: Abnormal   Collection Time: 06/14/18  5:24 AM  Result Value Ref Range   WBC 7.3 4.0 - 10.5 K/uL   RBC 2.89 (L) 4.22 - 5.81 MIL/uL   Hemoglobin 9.1 (L) 13.0 - 17.0 g/dL   HCT 28.0 (L) 39.0 - 52.0 %   MCV 96.9 80.0 - 100.0 fL   MCH 31.5 26.0 - 34.0 pg   MCHC 32.5 30.0 - 36.0 g/dL   RDW 15.1 11.5 - 15.5 %   Platelets 161  150 - 400 K/uL   nRBC 0.0 0.0 - 0.2 %    Comment: Performed at [facility], [address] [place], [place] [address]  Basic metabolic panel     Status: Abnormal   Collection Time: 06/14/18  5:24 AM  Result Value Ref Range   Sodium 140 135 - 145 mmol/L   Potassium 3.9 3.5 - 5.1 mmol/L   Chloride 102 98 - 111 mmol/L   CO2 27 22 - 32 mmol/L   Glucose, Bld 91 70 - 99 mg/dL   BUN 36 (H) 8 - 23 mg/dL   Creatinine, Ser 5.43 (H) 0.61 - 1.24 mg/dL   Calcium 9.1 8.9 - 10.3 mg/dL   GFR calc non Af Amer 10 (L) >60 mL/min   GFR calc Af Amer 11 (L) >60 mL/min   Anion gap 11 5 - 15    Comment: Performed at [facility], [place], [place] [address]  Heparin level (unfractionated)     Status: None   Collection Time: 06/14/18  5:24 AM  Result Value Ref Range   Heparin Unfractionated 0.38 0.30 - 0.70 IU/mL    Comment: (NOTE) If heparin results are below expected values, and patient dosage has  been confirmed, suggest follow up testing of antithrombin III levels. Performed at [facility], [address] [place], [place] [address]   Glucose, capillary     Status: None   Collection Time: 06/14/18  7:35 AM  Result Value Ref Range   Glucose-Capillary 87 70 - 99 mg/dL  Glucose, capillary     Status: None   Collection Time: 06/14/18 11:28 AM  Result Value Ref Range   Glucose-Capillary 83 70 - 99 mg/dL  Glucose, capillary     Status: None   Collection Time: 06/14/18  4:09 PM  Result Value Ref Range   Glucose-Capillary 70 70 - 99 mg/dL  Glucose,  capillary     Status: Abnormal   Collection Time: 06/14/18  9:08 PM  Result Value Ref Range   Glucose-Capillary 143 (H) 70 - 99 mg/dL   Comment 1 Notify RN   Heparin level (unfractionated)     Status: None   Collection Time: 06/15/18  3:57 AM  Result Value Ref Range   Heparin Unfractionated 0.30 0.30 - 0.70 IU/mL    Comment: (NOTE) If heparin results are below expected values, and patient dosage has  been confirmed, suggest follow up testing of antithrombin III levels. Performed at [facility], [address] [place], [place] [address]   CBC     Status: Abnormal   Collection Time: 06/15/18  3:57 AM  Result Value Ref Range   WBC 7.1 4.0 - 10.5 K/uL   RBC 2.75 (L) 4.22 - 5.81 MIL/uL   Hemoglobin 8.7 (L) 13.0 - 17.0 g/dL   HCT 27.0 (L) 39.0 - 52.0 %   MCV 98.2 80.0 - 100.0 fL   MCH 31.6 26.0 - 34.0 pg   MCHC 32.2 30.0 - 36.0 g/dL   RDW 15.2 11.5 - 15.5 %   Platelets 163 150 - 400 K/uL   nRBC 0.0 0.0 - 0.2 %    Comment: Performed at [facility], [address] [place], [place] [address]  Basic metabolic panel     Status: Abnormal   Collection Time: 06/15/18  3:57 AM  Result Value Ref Range   Sodium 138 135 - 145 mmol/L   Potassium 4.2 3.5 - 5.1 mmol/L   Chloride 102 98 -  111 mmol/L   CO2 24 22 - 32 mmol/L   Glucose, Bld 85 70 - 99 mg/dL   BUN 47 (H) 8 - 23 mg/dL   Creatinine, Ser 6.41 (H) 0.61 - 1.24 mg/dL   Calcium 8.9 8.9 - 10.3 mg/dL   GFR calc non Af Amer 8 (L) >60 mL/min   GFR calc Af Amer 9 (L) >60 mL/min   Anion gap 12 5 - 15    Comment: Performed at [facility], [address] [place], [place] [address]  Glucose, capillary     Status: None   Collection Time: 06/15/18  7:39 AM  Result Value Ref Range   Glucose-Capillary 75 70 - 99 mg/dL   Comment 1 Notify RN   Heparin level (unfractionated)     Status: None   Collection Time: 06/15/18  9:56 AM  Result Value Ref Range   Heparin Unfractionated 0.34 0.30 - 0.70 IU/mL    Comment:  (NOTE) If heparin results are below expected values, and patient dosage has  been confirmed, suggest follow up testing of antithrombin III levels. Performed at [facility], [address] [place], [place] [address]   Phosphorus     Status: Abnormal   Collection Time: 06/15/18  9:56 AM  Result Value Ref Range   Phosphorus 4.9 (H) 2.5 - 4.6 mg/dL    Comment: Performed at [facility], [address] [place], [place] [address]  Glucose, capillary     Status: None   Collection Time: 06/15/18  1:48 PM  Result Value Ref Range   Glucose-Capillary 77 70 - 99 mg/dL   Comment 1 Notify RN   Glucose, capillary     Status: Abnormal   Collection Time: 06/15/18  4:32 PM  Result Value Ref Range   Glucose-Capillary 140 (H) 70 - 99 mg/dL   Comment 1 Notify RN   Glucose, capillary     Status: None   Collection Time: 06/15/18  9:10 PM  Result Value Ref Range   Glucose-Capillary 84 70 - 99 mg/dL   Comment 1 Notify RN   Basic metabolic panel     Status: Abnormal   Collection Time: 06/16/18  4:28 AM  Result Value Ref Range   Sodium 139 135 - 145 mmol/L   Potassium 4.0 3.5 - 5.1 mmol/L   Chloride 99 98 - 111 mmol/L   CO2 29 22 - 32 mmol/L   Glucose, Bld 90 70 - 99 mg/dL   BUN 25 (H) 8 - 23 mg/dL   Creatinine, Ser 4.59 (H) 0.61 - 1.24 mg/dL   Calcium 8.9 8.9 - 10.3 mg/dL   GFR calc non Af Amer 12 (L) >60 mL/min   GFR calc Af Amer 14 (L) >60 mL/min   Anion gap 11 5 - 15    Comment: Performed at [facility], [place], [place] [address]  Heparin level (unfractionated)     Status: None   Collection Time: 06/16/18  4:28 AM  Result Value Ref Range   Heparin Unfractionated 0.40 0.30 - 0.70 IU/mL    Comment: (NOTE) If heparin results are below expected values, and patient dosage has  been confirmed, suggest follow up testing of antithrombin III levels. Performed at [facility], [address] [place], [place] [address]   CBC     Status:  Abnormal   Collection Time: 06/16/18  4:28 AM  Result Value Ref Range   WBC 7.0 4.0 - 10.5 K/uL   RBC  2.88 (L) 4.22 - 5.81 MIL/uL   Hemoglobin 9.1 (L) 13.0 - 17.0 g/dL   HCT 28.1 (L) 39.0 - 52.0 %   MCV 97.6 80.0 - 100.0 fL   MCH 31.6 26.0 - 34.0 pg   MCHC 32.4 30.0 - 36.0 g/dL   RDW 14.9 11.5 - 15.5 %   Platelets 157 150 - 400 K/uL   nRBC 0.0 0.0 - 0.2 %    Comment: Performed at [facility], [address] [place], [place] [address]  Glucose, capillary     Status: None   Collection Time: 06/16/18  7:48 AM  Result Value Ref Range   Glucose-Capillary 78 70 - 99 mg/dL   Comment 1 Notify RN   Glucose, capillary     Status: None   Collection Time: 06/16/18  9:51 AM  Result Value Ref Range   Glucose-Capillary 92 70 - 99 mg/dL   Comment 1 Notify RN   Glucose, capillary     Status: None   Collection Time: 06/16/18  1:08 PM  Result Value Ref Range   Glucose-Capillary 86 70 - 99 mg/dL  Glucose, capillary     Status: Abnormal   Collection Time: 06/16/18  5:40 PM  Result Value Ref Range   Glucose-Capillary 150 (H) 70 - 99 mg/dL  Glucose, capillary     Status: None   Collection Time: 06/16/18  8:51 PM  Result Value Ref Range   Glucose-Capillary 83 70 - 99 mg/dL   Comment 1 Notify RN   Renal function panel     Status: Abnormal   Collection Time: 06/17/18  5:53 AM  Result Value Ref Range   Sodium 137 135 - 145 mmol/L   Potassium 4.2 3.5 - 5.1 mmol/L   Chloride 98 98 - 111 mmol/L   CO2 27 22 - 32 mmol/L   Glucose, Bld 89 70 - 99 mg/dL   BUN 40 (H) 8 - 23 mg/dL   Creatinine, Ser 6.64 (H) 0.61 - 1.24 mg/dL   Calcium 8.7 (L) 8.9 - 10.3 mg/dL   Phosphorus 6.1 (H) 2.5 - 4.6 mg/dL   Albumin 3.0 (L) 3.5 - 5.0 g/dL   GFR calc non Af Amer 8 (L) >60 mL/min   GFR calc Af Amer 9 (L) >60 mL/min   Anion gap 12 5 - 15    Comment: Performed at [facility], [address] [place],  [address]  Phosphorus     Status: Abnormal   Collection Time: 06/17/18  5:53 AM   Result Value Ref Range   Phosphorus 5.7 (H) 2.5 - 4.6 mg/dL    Comment: Performed at [facility], [address] [place], [place] [address]  Glucose, capillary     Status: None   Collection Time: 06/17/18  7:45 AM  Result Value Ref Range   Glucose-Capillary 91 70 - 99 mg/dL   Comment 1 Notify RN   Glucose, capillary     Status: None   Collection Time: 06/17/18 12:46 PM  Result Value Ref Range   Glucose-Capillary 88 70 - 99 mg/dL  Glucose, capillary     Status: Abnormal   Collection Time: 06/17/18  4:41 PM  Result Value Ref Range   Glucose-Capillary 103 (H) 70 - 99 mg/dL   Comment 1 Notify RN   CBC     Status: Abnormal   Collection Time: 07/08/18 12:55 PM  Result Value Ref Range   WBC 8.1 4.0 - 10.5 K/uL   RBC 2.81 (L) 4.22 -  5.81 MIL/uL   Hemoglobin 8.9 (L) 13.0 - 17.0 g/dL   HCT 27.5 (L) 39.0 - 52.0 %   MCV 97.9 80.0 - 100.0 fL   MCH 31.7 26.0 - 34.0 pg   MCHC 32.4 30.0 - 36.0 g/dL   RDW 15.6 (H) 11.5 - 15.5 %   Platelets 176 150 - 400 K/uL   nRBC 0.0 0.0 - 0.2 %    Comment: Performed at [facility], [address] [place], [place] [address]  Comprehensive metabolic panel     Status: Abnormal   Collection Time: 07/08/18 12:55 PM  Result Value Ref Range   Sodium 137 135 - 145 mmol/L   Potassium 4.1 3.5 - 5.1 mmol/L   Chloride 101 98 - 111 mmol/L   CO2 24 22 - 32 mmol/L   Glucose, Bld 96 70 - 99 mg/dL   BUN 74 (H) 8 - 23 mg/dL   Creatinine, Ser 7.57 (H) 0.61 - 1.24 mg/dL   Calcium 8.7 (L) 8.9 - 10.3 mg/dL   Total Protein 7.0 6.5 - 8.1 g/dL   Albumin 3.3 (L) 3.5 - 5.0 g/dL   AST 54 (H) 15 - 41 U/L   ALT 51 (H) 0 - 44 U/L   Alkaline Phosphatase 49 38 - 126 U/L   Total Bilirubin 0.6 0.3 - 1.2 mg/dL   GFR calc non Af Amer 6 (L) >60 mL/min   GFR calc Af Amer 8 (L) >60 mL/min   Anion gap 12 5 - 15    Comment: Performed at [facility], [address] [place], [place] [address]  Uric acid     Status: None   Collection Time: 07/08/18  12:55 PM  Result Value Ref Range   Uric Acid, Serum 7.6 3.7 - 8.6 mg/dL    Comment: Performed at [facility], [address] [place], [place] [address]  Urinalysis, Complete w Microscopic     Status: Abnormal   Collection Time: 07/08/18 12:56 PM  Result Value Ref Range   Color, Urine YELLOW (A) YELLOW   APPearance CLEAR (A) CLEAR   Specific Gravity, Urine 1.014 1.005 - 1.030   pH 6.0 5.0 - 8.0   Glucose, UA NEGATIVE NEGATIVE mg/dL   Hgb urine dipstick NEGATIVE NEGATIVE   Bilirubin Urine NEGATIVE NEGATIVE   Ketones, ur NEGATIVE NEGATIVE mg/dL   Protein, ur 100 (A) NEGATIVE mg/dL   Nitrite NEGATIVE NEGATIVE   Leukocytes, UA NEGATIVE NEGATIVE   WBC, UA 0-5 0 - 5 WBC/hpf   Bacteria, UA NONE SEEN NONE SEEN   Squamous Epithelial / LPF 0-5 0 - 5    Comment: Performed at [facility], [address] [place],  [address]  Glucose, capillary     Status: None   Collection Time: 07/23/18  8:21 AM  Result Value Ref Range   Glucose-Capillary 92 70 - 99 mg/dL  I-STAT 4, (NA,K, GLUC, HGB,HCT)     Status: Abnormal   Collection Time: 07/23/18  8:45 AM  Result Value Ref Range   Sodium 139 135 - 145 mmol/L   Potassium 3.6 3.5 - 5.1 mmol/L   Glucose, Bld 108 (H) 70 - 99 mg/dL   HCT 33.0 (L) 39.0 - 52.0 %   Hemoglobin 11.2 (L) 13.0 - 17.0 g/dL  Surgical pathology     Status: None   Collection Time: 07/23/18  9:34 AM  Result Value Ref Range   SURGICAL PATHOLOGY      Surgical Pathology CASE: [id number] PATIENT: Jeneen Rinks  [facility] Pathology Report     SPECIMEN SUBMITTED: A. 3rd toe, right B. 2nd toe, left  CLINICAL HISTORY: None provided  PRE-OPERATIVE DIAGNOSIS: I96 gangrene toes both feet  POST-OPERATIVE DIAGNOSIS: Sam as pre op     DIAGNOSIS: A.  TOE, THIRD RIGHT; AMPUTATION: - SKIN AND UNDERLYING SOFT TISSUE WITH NECROSIS AND FOCAL ABSCESS FORMATION. - UNDERLYING OSTEOMYELITIS. - VIABLE SOFT TISSUE AND ARTICULAR BONE AT MARGIN.  B.   TOE, SECOND LEFT; AMPUTATION: - SKIN AND UNDERLYING SOFT TISSUE WITH NECROSIS. - UNDERLYING OSTEOMYELITIS. - VIABLE SOFT TISSUE AND ARTICULAR BONE AT MARGIN.  GROSS DESCRIPTION: A. Labeled: Right third toe Received: Formalin Size: Proximal skin resection margin to distal tip = 3.7 cm in length and up to 1.7 cm in diameter.  Extending above the soft tissue resection margin is a segment of bone covered by tan soft tissue measuring 1.3 cm in length with a concave gray-whi te glistening articular cartilage measuring 1.0 x 0.8 cm as the bone margin. Description of lesion(s): The distal tip of the toe is gray-black soft measuring 1.5 x 0.3 cm. Proximal margin: Skin margin is inked blue Bone: Bone margin is inked black  Block summary: A1-A2.  Proximal bone margin to proximal skin margin to distal tip lesion (perpendicular sectioned)  Tissue decalcification: A1-A2   B. Labeled: Left second toe Received: Formalin Size: Proximal skin resection margin to distal tip = 4.5 cm in length and up to 1.7 cm in diameter.  Extending above the soft tissue resection margin is a segment of bone covered by tan soft tissue measuring 0.7 cm in length with a concave gray-white glistening, articular cartilage measuring 1.0 x 1.0 cm as the bone margin. Description of lesion(s): The distal tip is mummified, firm black-brown involving the nailbed. Proximal margin: Skin margin is inked blue Bone: Bone margin is inked black  Block summary: B1-B3.  Proximal  bone margin to proximal skin margin to distal tip lesion, perpendicular section  Tissue decalcification: B1-B3      Final Diagnosis performed by Quay Burow, MD.   Electronically signed 07/26/2018 2:35:20PM The electronic signature indicates that the named Attending Pathologist has evaluated the specimen  Technical component performed at [facility], [address], [place], [place] [address] Lab: [phone] Dir: Rush Farmer, MD, MMM  Professional  component performed at [facility], [facility], [place], [place], [place] [address] Lab: [phone] Dir: Dellia Nims. Rubinas, MD   Glucose, capillary     Status: None   Collection Time: 07/23/18 10:16 AM  Result Value Ref Range   Glucose-Capillary 76 70 - 99 mg/dL    Radiology No results found.  Assessment/Plan Diabetes mellitus without complication ([place]) Likely an underlying cause of his renal failure andblood glucose control important in reducing the progression of atherosclerotic disease. Also, involved in wound healing. On appropriate medications.   Hypertension Likely an underlying cause of his renal failure andblood pressure control important in reducing the progression of atherosclerotic disease. On appropriate oral medications.  ESRD on dialysis ([place]) Fistula in the left arm with good flow can likely be used for dialysis at any point. Recommend starting to use it now so that we can get his catheter out.  Atherosclerosis of native arteries of the extremities with gangrene [facility]) The patient has had multiple toes removed on the right foot and his second toe removed on the left foot.  His first and second digits that remain on the right foot appear viable.  His third digit on the left  foot is dark and appears to be demarcating with dry gangrene. He is studied with noninvasive studies today which show a right ABI of 1.03 and a left ABI of 0.76.  Although his waveforms at the ankle appear to be pretty good, his digit pressures are reduced bilaterally.  His digit pressure is essentially undetectable on the right but he has had multiple digital amputations.  On the left the digit pressure was 45 in the great toe. This is clearly a critical and limb threatening situation.  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  Given this critical and limb  threatening situation, angiogram of the left lower extremity would be prudent at this point.  We may have to repeat an angiogram on the right as well going forward, but the perfusion in terms of ABIs is significantly lower on the left now and he has dry gangrenous changes to the left third toe.  This cannot wait several weeks and I will try to get him on the schedule for next week.    Leotis Pain, MD  08/20/2018 4:31 PM    This note was created with Dragon medical transcription system.  Any errors from dictation are purely unintentional

## 2018-08-23 NOTE — Telephone Encounter (Signed)
I attempted to contact the son twice today to give him the patient's pre-op information. I left a message on the son's voicemail about the patients appt for 08/24/2018 with a 8:45 am arrival time.

## 2018-08-24 ENCOUNTER — Other Ambulatory Visit (INDEPENDENT_AMBULATORY_CARE_PROVIDER_SITE_OTHER): Payer: Self-pay | Admitting: Nurse Practitioner

## 2018-08-24 ENCOUNTER — Inpatient Hospital Stay: Admission: RE | Admit: 2018-08-24 | Payer: Medicare Other | Source: Ambulatory Visit

## 2018-08-24 MED ORDER — CLINDAMYCIN PHOSPHATE 300 MG/50ML IV SOLN: 300.0000 mg | Freq: Once | INTRAVENOUS | Status: DC

## 2018-08-24 NOTE — Telephone Encounter (Signed)
Spoke with the patient's son [name] and explained the zero visitor policy for [facility].

## 2018-08-25 ENCOUNTER — Ambulatory Visit
Admission: RE | Admit: 2018-08-25 | Discharge: 2018-08-25 | Payer: Medicare Other | Attending: Vascular Surgery | Admitting: Vascular Surgery

## 2018-08-25 ENCOUNTER — Other Ambulatory Visit: Payer: Self-pay

## 2018-08-25 DIAGNOSIS — I96 Gangrene, not elsewhere classified: Secondary | ICD-10-CM

## 2018-08-25 DIAGNOSIS — I739 Peripheral vascular disease, unspecified: Secondary | ICD-10-CM

## 2018-08-25 MED ORDER — HYDROMORPHONE HCL 1 MG/ML IJ SOLN: 1.0000 mg | Freq: Once | INTRAMUSCULAR | Status: DC | PRN

## 2018-08-25 MED ORDER — DIPHENHYDRAMINE HCL 50 MG/ML IJ SOLN: 50.0000 mg | Freq: Once | INTRAMUSCULAR | Status: DC | PRN

## 2018-08-25 MED ORDER — ONDANSETRON HCL 4 MG/2ML IJ SOLN: 4.0000 mg | Freq: Four times a day (QID) | INTRAMUSCULAR | Status: DC | PRN

## 2018-08-25 MED ORDER — HEPARIN SODIUM (PORCINE) 1000 UNIT/ML IJ SOLN
INTRAMUSCULAR | Status: AC
  Filled 2018-08-25: qty 1

## 2018-08-25 MED ORDER — MIDAZOLAM HCL 2 MG/ML PO SYRP: 8.0000 mg | ORAL_SOLUTION | Freq: Once | ORAL | Status: DC | PRN

## 2018-08-25 MED ORDER — SODIUM CHLORIDE 0.9 % IV SOLN: INTRAVENOUS | Status: DC

## 2018-08-25 MED ORDER — FAMOTIDINE 20 MG PO TABS: 40.0000 mg | ORAL_TABLET | Freq: Once | ORAL | Status: DC | PRN

## 2018-08-25 MED ORDER — METHYLPREDNISOLONE SODIUM SUCC 125 MG IJ SOLR: 125.0000 mg | Freq: Once | INTRAMUSCULAR | Status: DC | PRN

## 2018-08-25 NOTE — Progress Notes (Signed)
Patient late for his procedure. Called contact number in chart, left message for patient to call back.

## 2018-08-30 ENCOUNTER — Encounter: Payer: Self-pay | Admitting: *Deleted

## 2018-08-30 ENCOUNTER — Ambulatory Visit
Admission: RE | Admit: 2018-08-30 | Discharge: 2018-08-30 | Disposition: A | Discharge status: Home / Self Care | Payer: Medicare Other | Attending: Vascular Surgery | Admitting: Vascular Surgery

## 2018-08-30 ENCOUNTER — Other Ambulatory Visit: Payer: Self-pay

## 2018-08-30 ENCOUNTER — Other Ambulatory Visit (INDEPENDENT_AMBULATORY_CARE_PROVIDER_SITE_OTHER): Payer: Self-pay | Admitting: Vascular Surgery

## 2018-08-30 ENCOUNTER — Encounter
Admission: RE | Disposition: A | Discharge status: Home / Self Care | Payer: Self-pay | Source: Home / Self Care | Attending: Vascular Surgery

## 2018-08-30 DIAGNOSIS — I70261 Atherosclerosis of native arteries of extremities with gangrene, right leg: Secondary | ICD-10-CM | POA: Insufficient documentation

## 2018-08-30 DIAGNOSIS — Z79899 Other long term (current) drug therapy: Secondary | ICD-10-CM | POA: Diagnosis not present

## 2018-08-30 DIAGNOSIS — I120 Hypertensive chronic kidney disease with stage 5 chronic kidney disease or end stage renal disease: Secondary | ICD-10-CM | POA: Diagnosis not present

## 2018-08-30 DIAGNOSIS — Z95820 Peripheral vascular angioplasty status with implants and grafts: Secondary | ICD-10-CM | POA: Diagnosis not present

## 2018-08-30 DIAGNOSIS — Z7902 Long term (current) use of antithrombotics/antiplatelets: Secondary | ICD-10-CM | POA: Diagnosis not present

## 2018-08-30 DIAGNOSIS — Z89421 Acquired absence of other right toe(s): Secondary | ICD-10-CM | POA: Diagnosis not present

## 2018-08-30 DIAGNOSIS — I70245 Atherosclerosis of native arteries of left leg with ulceration of other part of foot: Secondary | ICD-10-CM | POA: Diagnosis not present

## 2018-08-30 DIAGNOSIS — L97529 Non-pressure chronic ulcer of other part of left foot with unspecified severity: Secondary | ICD-10-CM | POA: Diagnosis not present

## 2018-08-30 DIAGNOSIS — Z7984 Long term (current) use of oral hypoglycemic drugs: Secondary | ICD-10-CM | POA: Insufficient documentation

## 2018-08-30 DIAGNOSIS — Z7982 Long term (current) use of aspirin: Secondary | ICD-10-CM | POA: Insufficient documentation

## 2018-08-30 DIAGNOSIS — N186 End stage renal disease: Secondary | ICD-10-CM | POA: Diagnosis not present

## 2018-08-30 DIAGNOSIS — N179 Acute kidney failure, unspecified: Secondary | ICD-10-CM | POA: Insufficient documentation

## 2018-08-30 DIAGNOSIS — Z992 Dependence on renal dialysis: Secondary | ICD-10-CM | POA: Diagnosis not present

## 2018-08-30 DIAGNOSIS — M109 Gout, unspecified: Secondary | ICD-10-CM | POA: Diagnosis not present

## 2018-08-30 DIAGNOSIS — Z8673 Personal history of transient ischemic attack (TIA), and cerebral infarction without residual deficits: Secondary | ICD-10-CM | POA: Insufficient documentation

## 2018-08-30 DIAGNOSIS — Z880 Allergy status to penicillin: Secondary | ICD-10-CM | POA: Diagnosis not present

## 2018-08-30 DIAGNOSIS — F17200 Nicotine dependence, unspecified, uncomplicated: Secondary | ICD-10-CM | POA: Diagnosis not present

## 2018-08-30 DIAGNOSIS — E1122 Type 2 diabetes mellitus with diabetic chronic kidney disease: Secondary | ICD-10-CM | POA: Insufficient documentation

## 2018-08-30 DIAGNOSIS — E785 Hyperlipidemia, unspecified: Secondary | ICD-10-CM | POA: Diagnosis not present

## 2018-08-30 DIAGNOSIS — E11621 Type 2 diabetes mellitus with foot ulcer: Secondary | ICD-10-CM | POA: Diagnosis present

## 2018-08-30 DIAGNOSIS — H409 Unspecified glaucoma: Secondary | ICD-10-CM | POA: Diagnosis not present

## 2018-08-30 DIAGNOSIS — Z89422 Acquired absence of other left toe(s): Secondary | ICD-10-CM | POA: Diagnosis not present

## 2018-08-30 HISTORY — PX: LOWER EXTREMITY ANGIOGRAPHY: CATH118251

## 2018-08-30 LAB — POTASSIUM (ARMC VASCULAR LAB ONLY): Potassium (ARMC vascular lab): 2.7 — CL (ref 3.5–5.1)

## 2018-08-30 LAB — GLUCOSE, CAPILLARY
Glucose-Capillary: 166 mg/dL — ABNORMAL HIGH (ref 70–99)
Glucose-Capillary: 102 mg/dL — ABNORMAL HIGH (ref 70–99)

## 2018-08-30 SURGERY — LOWER EXTREMITY ANGIOGRAPHY
Anesthesia: Moderate Sedation | Laterality: Left

## 2018-08-30 MED ORDER — MIDAZOLAM HCL 5 MG/5ML IJ SOLN
INTRAMUSCULAR | Status: AC
  Filled 2018-08-30: qty 5

## 2018-08-30 MED ORDER — SODIUM CHLORIDE 0.9 % IV SOLN: INTRAVENOUS | Status: DC

## 2018-08-30 MED ORDER — HYDRALAZINE HCL 20 MG/ML IJ SOLN: 5.0000 mg | INTRAMUSCULAR | Status: DC | PRN

## 2018-08-30 MED ORDER — SODIUM CHLORIDE 0.9 % IV SOLN
INTRAVENOUS | Status: DC
  Administered 2018-08-30: 10:00:00 via INTRAVENOUS

## 2018-08-30 MED ORDER — POTASSIUM CHLORIDE CRYS ER 20 MEQ PO TBCR
40.0000 meq | EXTENDED_RELEASE_TABLET | Freq: Once | ORAL | Status: AC
  Administered 2018-08-30: 40 meq via ORAL

## 2018-08-30 MED ORDER — CLINDAMYCIN PHOSPHATE 300 MG/50ML IV SOLN
INTRAVENOUS | Status: AC
  Administered 2018-08-30: 300 mg via INTRAVENOUS
  Filled 2018-08-30: qty 50

## 2018-08-30 MED ORDER — FENTANYL CITRATE (PF) 100 MCG/2ML IJ SOLN
INTRAMUSCULAR | Status: DC | PRN
  Administered 2018-08-30: 50 ug via INTRAVENOUS

## 2018-08-30 MED ORDER — FENTANYL CITRATE (PF) 100 MCG/2ML IJ SOLN
INTRAMUSCULAR | Status: AC
  Filled 2018-08-30: qty 2

## 2018-08-30 MED ORDER — POTASSIUM CHLORIDE CRYS ER 20 MEQ PO TBCR
EXTENDED_RELEASE_TABLET | ORAL | Status: AC
  Filled 2018-08-30: qty 2

## 2018-08-30 MED ORDER — ONDANSETRON HCL 4 MG/2ML IJ SOLN: 4.0000 mg | Freq: Four times a day (QID) | INTRAMUSCULAR | Status: DC | PRN

## 2018-08-30 MED ORDER — DIPHENHYDRAMINE HCL 50 MG/ML IJ SOLN: 50.0000 mg | Freq: Once | INTRAMUSCULAR | Status: DC | PRN

## 2018-08-30 MED ORDER — SODIUM CHLORIDE 0.9 % IV SOLN: 250.0000 mL | INTRAVENOUS | Status: DC | PRN

## 2018-08-30 MED ORDER — LABETALOL HCL 5 MG/ML IV SOLN: 10.0000 mg | INTRAVENOUS | Status: DC | PRN

## 2018-08-30 MED ORDER — HEPARIN (PORCINE) IN NACL 1000-0.9 UT/500ML-% IV SOLN
INTRAVENOUS | Status: AC
  Filled 2018-08-30: qty 1000

## 2018-08-30 MED ORDER — ATORVASTATIN CALCIUM 10 MG PO TABS
10.0000 mg | ORAL_TABLET | Freq: Every day | ORAL | Status: DC
  Filled 2018-08-30: qty 1

## 2018-08-30 MED ORDER — LIDOCAINE HCL (PF) 1 % IJ SOLN
INTRAMUSCULAR | Status: AC
  Filled 2018-08-30: qty 30

## 2018-08-30 MED ORDER — MIDAZOLAM HCL 2 MG/2ML IJ SOLN
INTRAMUSCULAR | Status: DC | PRN
  Administered 2018-08-30: 2 mg via INTRAVENOUS

## 2018-08-30 MED ORDER — HEPARIN SODIUM (PORCINE) 1000 UNIT/ML IJ SOLN
INTRAMUSCULAR | Status: DC | PRN
  Administered 2018-08-30: 5000 [IU] via INTRAVENOUS

## 2018-08-30 MED ORDER — FAMOTIDINE 20 MG PO TABS: 40.0000 mg | ORAL_TABLET | Freq: Once | ORAL | Status: DC | PRN

## 2018-08-30 MED ORDER — SODIUM CHLORIDE 0.9% FLUSH: 3.0000 mL | Freq: Two times a day (BID) | INTRAVENOUS | Status: DC

## 2018-08-30 MED ORDER — HEPARIN SODIUM (PORCINE) 1000 UNIT/ML IJ SOLN
INTRAMUSCULAR | Status: AC
  Filled 2018-08-30: qty 1

## 2018-08-30 MED ORDER — METHYLPREDNISOLONE SODIUM SUCC 125 MG IJ SOLR: 125.0000 mg | Freq: Once | INTRAMUSCULAR | Status: DC | PRN

## 2018-08-30 MED ORDER — HYDROMORPHONE HCL 1 MG/ML IJ SOLN
INTRAMUSCULAR | Status: AC
  Filled 2018-08-30: qty 0.5

## 2018-08-30 MED ORDER — ATORVASTATIN CALCIUM 10 MG PO TABS: 10.0000 mg | ORAL_TABLET | Freq: Every day | ORAL | 3 refills | Status: DC

## 2018-08-30 MED ORDER — SODIUM CHLORIDE 0.9% FLUSH: 3.0000 mL | INTRAVENOUS | Status: DC | PRN

## 2018-08-30 MED ORDER — HYDROMORPHONE HCL 1 MG/ML IJ SOLN
1.0000 mg | Freq: Once | INTRAMUSCULAR | Status: AC | PRN
  Administered 2018-08-30: 0.5 mg via INTRAVENOUS

## 2018-08-30 MED ORDER — ACETAMINOPHEN 325 MG PO TABS: 650.0000 mg | ORAL_TABLET | ORAL | Status: DC | PRN

## 2018-08-30 MED ORDER — MIDAZOLAM HCL 2 MG/ML PO SYRP: 8.0000 mg | ORAL_SOLUTION | Freq: Once | ORAL | Status: DC | PRN

## 2018-08-30 MED ORDER — CLINDAMYCIN PHOSPHATE 300 MG/50ML IV SOLN
300.0000 mg | Freq: Once | INTRAVENOUS | Status: AC
  Administered 2018-08-30: 300 mg via INTRAVENOUS

## 2018-08-30 SURGICAL SUPPLY — 20 items
BALLN LUTONIX 018 6X220X130 (BALLOONS) ×3
BALLN LUTONIX 018 6X300X130 (BALLOONS) ×3
BALLN LUTONIX DCB 4X100X130 (BALLOONS) ×3
BALLOON LUTONIX 018 6X220X130 (BALLOONS) ×1 IMPLANT
BALLOON LUTONIX 018 6X300X130 (BALLOONS) ×1 IMPLANT
BALLOON LUTONIX DCB 4X100X130 (BALLOONS) ×1 IMPLANT
CATH BEACON 5 .035 65 RIM TIP (CATHETERS) ×3 IMPLANT
CATH BEACON 5 .038 100 VERT TP (CATHETERS) ×3 IMPLANT
CATH PIG 70CM (CATHETERS) ×3 IMPLANT
COVER PROBE U/S 5X48 (MISCELLANEOUS) ×3 IMPLANT
DEVICE PRESTO INFLATION (MISCELLANEOUS) ×3 IMPLANT
DEVICE STARCLOSE SE CLOSURE (Vascular Products) ×3 IMPLANT
GLIDEWIRE ADV .035X260CM (WIRE) ×3 IMPLANT
PACK ANGIOGRAPHY (CUSTOM PROCEDURE TRAY) ×3 IMPLANT
SHEATH ANL2 6FRX45 HC (SHEATH) ×3 IMPLANT
SHEATH BRITE TIP 5FRX11 (SHEATH) ×3 IMPLANT
TOWEL OR 17X26 4PK STRL BLUE (TOWEL DISPOSABLE) ×3 IMPLANT
TUBING CONTRAST HIGH PRESS 72 (TUBING) ×3 IMPLANT
WIRE G V18X300CM (WIRE) ×3 IMPLANT
WIRE J 3MM .035X145CM (WIRE) ×3 IMPLANT

## 2018-08-30 NOTE — Op Note (Signed)
[facility]  Percutaneous Study/Intervention Procedural Note   Date of Surgery: 08/30/2018  Surgeon(s):Ilze Roselli    Assistants:none  Pre-operative Diagnosis: PAD with ulceration LLE  Post-operative diagnosis:  Same  Procedure(s) Performed:             1.  Ultrasound guidance for vascular access right femoral artery             2.  Catheter placement into left common femoral artery from right femoral approach             3.  Aortogram and selective left lower extremity angiogram             4.  Percutaneous transluminal angioplasty of left TP trunk and proximal posterior tibial artery with 4 mm x 10 cm lutonix drug coated angioplasty.             5.   Percutaneous transluminal angioplasty of the entire left SFA and above-knee popliteal artery with a 6 mm diameter by 30 cm length and a 6 mm diameter by 22 cm length Lutonix drug-coated angioplasty balloon  6.  StarClose closure device right femoral artery  EBL: 10 cc  Contrast: 110 cc  Fluoro Time: 12.7 minutes  Moderate Conscious Sedation Time: approximately 50 minutes using 2 mg of Versed and 50 Mcg of Fentanyl              Indications:  Patient is a 73 y.o.male with nonhealing ulceration and pain of the left foot. The patient has noninvasive study showing reduced distal perfusion. The patient is brought in for angiography for further evaluation and potential treatment.  Due to the limb threatening nature of the situation, angiogram was performed for attempted limb salvage. The patient is aware that if the procedure fails, amputation would be expected.  The patient also understands that even with successful revascularization, amputation may still be required due to the severity of the situation.  Risks and benefits are discussed and informed consent is obtained.   Procedure:  The patient was identified and appropriate procedural time out was performed.  The patient was then placed supine on the table and prepped  and draped in the usual sterile fashion. Moderate conscious sedation was administered during a face to face encounter with the patient throughout the procedure with my supervision of the RN administering medicines and monitoring the patient's vital signs, pulse oximetry, telemetry and mental status throughout from the start of the procedure until the patient was taken to the recovery room. Ultrasound was used to evaluate the right common femoral artery.  It was patent .  A digital ultrasound image was acquired.  A Seldinger needle was used to access the right common femoral artery under direct ultrasound guidance and a permanent image was performed.  A 0.035 J wire was advanced without resistance and a 5Fr sheath was placed.  Pigtail catheter was placed into the aorta and an AP aortogram was performed. This demonstrated sluggish renal artery flow.  Aorta is patent without stenosis.  Iliac arteries including the previously placed iliac stents are now widely patent without significant stenosis. I then crossed the aortic bifurcation and advanced to the left femoral head. Selective left lower extremity angiogram was then performed. This demonstrated 75 to 80% stenosis of the proximal SFA at the origin, there was then diffuse intermittent disease in the mid and distal SFA with areas of 60 to 80% stenosis in 3 different locations.  The above-knee popliteal artery then had a high-grade  stenosis of about 90% with near normalization of the below-knee popliteal artery.  There was a typical tibial trifurcation with disease in the tibioperoneal trunk and proximal posterior tibial artery which was the dominant runoff to the foot.  The stenosis in these locations was in the 70 to 80% range.  The peroneal artery was small but without focal stenosis that was obvious.  The anterior tibial artery occluded in the midsegment and did not reconstitute distally. It was felt that it was in the patient's best interest to proceed with  intervention after these images to avoid a second procedure and a larger amount of contrast and fluoroscopy based off of the findings from the initial angiogram. The patient was systemically heparinized and a 6 [place] Ansell sheath was then placed over the [employer] wire. I then used a Kumpe catheter and the advantage wire to navigate through the SFA and popliteal lesions.  After injection in the below-knee popliteal artery, this gave [place] the roadmap to get across the tibial lesions and the use the Kumpe catheter and exchanged for a 0.018 wire and cross the stenosis in the tibioperoneal trunk and posterior tibial arteries.  The wire was then parked in the foot.  Angioplasty was then performed of the tibioperoneal trunk and proximal posterior with a 4 mm diameter by 10 cm length Lutonix drug-coated angioplasty balloon.  The initial inflation was to 8 atm for 1 minute with some residual stenosis approaching 50% so a more vigorous inflation of 12 atm was performed for another minute.  This resulted in about a 20 to 30% residual stenosis in the tibioperoneal trunk and proximal posterior tibial artery which was continuous to the foot.  A 6 mm diameter by 30 cm length Lutonix drug-coated angioplasty balloon was inflated from the mid popliteal artery up to the mid SFA.  This was taken to 8 atm for 1 minute.  Multiple areas of waist were taken which resolved at 8 atm.  For the proximal to mid SFA a 6 mm diameter by 22 cm length Lutonix drug-coated angioplasty balloon was also inflated to 8 atm for 1 minute.  Completion imaging of the SFA and popliteal artery showed markedly improved flow with no areas of greater than 20 to 25% residual stenosis after the angioplasties.  His flow was sluggish due to a reduced ejection fraction but at this point, there were no obvious focal stenosis requiring treatment. I elected to terminate the procedure. The sheath was removed and StarClose closure device was deployed in the right  femoral artery with excellent hemostatic result. The patient was taken to the recovery room in stable condition having tolerated the procedure well.  Findings:               Aortogram:  Sluggish renal artery flow.  Aorta is patent without stenosis.  Iliac arteries including the previously placed iliac stents are now widely patent without significant stenosis.             Left Lower Extremity:  This demonstrated 75 to 80% stenosis of the proximal SFA at the origin, there was then diffuse intermittent disease in the mid and distal SFA with areas of 60 to 80% stenosis in 3 different locations.  The above-knee popliteal artery then had a high-grade stenosis of about 90% with near normalization of the below-knee popliteal artery.  There was a typical tibial trifurcation with disease in the tibioperoneal trunk and proximal posterior tibial artery which was the dominant runoff to the foot.  The stenosis in these locations was in the 70 to 80% range.  The peroneal artery was small but without focal stenosis that was obvious.  The anterior tibial artery occluded in the midsegment and did not reconstitute distally   Disposition: Patient was taken to the recovery room in stable condition having tolerated the procedure well.  Complications: None  Leotis Pain 08/30/2018 12:16 PM   This note was created with Dragon Medical transcription system. Any errors in dictation are purely unintentional.

## 2018-08-30 NOTE — H&P (Signed)
[facility] History & Physical Update  The patient was interviewed and re-examined.  The patient's previous History and Physical has been reviewed and is unchanged.  There is no change in the plan of care. We plan to proceed with the scheduled procedure.  Leotis Pain, MD  08/30/2018, 9:16 AM

## 2018-08-30 NOTE — Discharge Instructions (Signed)

## 2018-08-31 ENCOUNTER — Encounter: Payer: Self-pay | Admitting: Vascular Surgery

## 2018-09-03 ENCOUNTER — Telehealth (INDEPENDENT_AMBULATORY_CARE_PROVIDER_SITE_OTHER): Payer: Self-pay

## 2018-09-06 ENCOUNTER — Encounter
Admission: RE | Disposition: A | Discharge status: Home / Self Care | Payer: Self-pay | Source: Home / Self Care | Attending: Vascular Surgery

## 2018-09-06 ENCOUNTER — Other Ambulatory Visit (INDEPENDENT_AMBULATORY_CARE_PROVIDER_SITE_OTHER): Payer: Self-pay | Admitting: Nurse Practitioner

## 2018-09-06 ENCOUNTER — Ambulatory Visit
Admission: RE | Admit: 2018-09-06 | Discharge: 2018-09-06 | Disposition: A | Discharge status: Home / Self Care | Payer: Medicare Other | Attending: Vascular Surgery | Admitting: Vascular Surgery

## 2018-09-06 DIAGNOSIS — I70249 Atherosclerosis of native arteries of left leg with ulceration of unspecified site: Secondary | ICD-10-CM | POA: Diagnosis not present

## 2018-09-06 DIAGNOSIS — Z992 Dependence on renal dialysis: Secondary | ICD-10-CM | POA: Insufficient documentation

## 2018-09-06 DIAGNOSIS — N186 End stage renal disease: Secondary | ICD-10-CM | POA: Diagnosis not present

## 2018-09-06 DIAGNOSIS — E1151 Type 2 diabetes mellitus with diabetic peripheral angiopathy without gangrene: Secondary | ICD-10-CM | POA: Diagnosis not present

## 2018-09-06 DIAGNOSIS — Z9889 Other specified postprocedural states: Secondary | ICD-10-CM

## 2018-09-06 DIAGNOSIS — Z8673 Personal history of transient ischemic attack (TIA), and cerebral infarction without residual deficits: Secondary | ICD-10-CM | POA: Insufficient documentation

## 2018-09-06 DIAGNOSIS — F329 Major depressive disorder, single episode, unspecified: Secondary | ICD-10-CM | POA: Insufficient documentation

## 2018-09-06 DIAGNOSIS — L97929 Non-pressure chronic ulcer of unspecified part of left lower leg with unspecified severity: Secondary | ICD-10-CM | POA: Diagnosis not present

## 2018-09-06 DIAGNOSIS — L97919 Non-pressure chronic ulcer of unspecified part of right lower leg with unspecified severity: Secondary | ICD-10-CM

## 2018-09-06 DIAGNOSIS — Z880 Allergy status to penicillin: Secondary | ICD-10-CM | POA: Diagnosis not present

## 2018-09-06 DIAGNOSIS — Z79899 Other long term (current) drug therapy: Secondary | ICD-10-CM

## 2018-09-06 DIAGNOSIS — Z8546 Personal history of malignant neoplasm of prostate: Secondary | ICD-10-CM | POA: Insufficient documentation

## 2018-09-06 DIAGNOSIS — E785 Hyperlipidemia, unspecified: Secondary | ICD-10-CM | POA: Diagnosis not present

## 2018-09-06 DIAGNOSIS — F17210 Nicotine dependence, cigarettes, uncomplicated: Secondary | ICD-10-CM

## 2018-09-06 DIAGNOSIS — H409 Unspecified glaucoma: Secondary | ICD-10-CM | POA: Diagnosis not present

## 2018-09-06 DIAGNOSIS — I70239 Atherosclerosis of native arteries of right leg with ulceration of unspecified site: Secondary | ICD-10-CM

## 2018-09-06 DIAGNOSIS — I120 Hypertensive chronic kidney disease with stage 5 chronic kidney disease or end stage renal disease: Secondary | ICD-10-CM | POA: Insufficient documentation

## 2018-09-06 DIAGNOSIS — Z452 Encounter for adjustment and management of vascular access device: Secondary | ICD-10-CM | POA: Insufficient documentation

## 2018-09-06 DIAGNOSIS — M109 Gout, unspecified: Secondary | ICD-10-CM | POA: Insufficient documentation

## 2018-09-06 DIAGNOSIS — E11622 Type 2 diabetes mellitus with other skin ulcer: Secondary | ICD-10-CM | POA: Insufficient documentation

## 2018-09-06 DIAGNOSIS — E1122 Type 2 diabetes mellitus with diabetic chronic kidney disease: Secondary | ICD-10-CM | POA: Insufficient documentation

## 2018-09-06 HISTORY — PX: DIALYSIS/PERMA CATHETER REMOVAL: CATH118289

## 2018-09-06 SURGERY — DIALYSIS/PERMA CATHETER REMOVAL
Anesthesia: LOCAL

## 2018-09-06 MED ORDER — LIDOCAINE-EPINEPHRINE (PF) 1 %-1:200000 IJ SOLN
INTRAMUSCULAR | Status: DC | PRN
  Administered 2018-09-06: 20 mL via INTRADERMAL

## 2018-09-06 MED ORDER — TRAMADOL HCL 50 MG PO TABS: 50.0000 mg | ORAL_TABLET | Freq: Four times a day (QID) | ORAL | 0 refills | Status: AC | PRN

## 2018-09-06 MED ORDER — SODIUM CHLORIDE 0.9 % IV SOLN: INTRAVENOUS | Status: DC

## 2018-09-06 MED ORDER — OXYCODONE-ACETAMINOPHEN 7.5-325 MG PO TABS: 1.0000 | ORAL_TABLET | ORAL | 0 refills | Status: DC | PRN

## 2018-09-06 SURGICAL SUPPLY — 2 items
FORCEPS HALSTEAD CVD 5IN STRL (INSTRUMENTS) ×2 IMPLANT
TRAY LACERAT/PLASTIC (MISCELLANEOUS) ×2 IMPLANT

## 2018-09-06 NOTE — Telephone Encounter (Signed)
Homehealth nurse is aware with medical advice

## 2018-09-06 NOTE — Telephone Encounter (Signed)
I have sent in tramadol for him.  If we can reinforce why he should be wearing his dressings that would be helpful.  The burning could be gout or neuropathy, he should see PCP for further workup.

## 2018-09-06 NOTE — H&P (Signed)
[facility] Admission History & Physical  MRN : [id number]  Jose [name] is a 73 y.o. (May 16, 1946) male who presents with chief complaint of No chief complaint on file. Marland Kitchen  History of Present Illness: I am asked to evaluate the patient by the dialysis center. The patient was sent here because they have a nonfunctioning tunneled catheter and a functioning arm access.  The patient reports they're not been any problems with any of their dialysis runs. They are reporting good flows with good parameters at dialysis.  Patient denies pain or tenderness overlying the access.  There is no pain with dialysis.  The patient denies hand pain or finger pain consistent with steal syndrome.  No fevers or chills while on dialysis.   No current facility-administered medications for this encounter.     Past Medical History:  Diagnosis Date  . Chronic kidney disease   . Depression   . Diabetes mellitus without complication ([place])   . Glaucoma   . Glaucoma   . Gout   . Heart murmur   . HLD (hyperlipidemia)   . Hypertension   . Prostate cancer ([place])   . Renal failure    dialysis t/t/s  . Stroke ([place])    tia x 2    Past Surgical History:  Procedure Laterality Date  . AMPUTATION TOE Bilateral 07/23/2018   Procedure: TOE MPJ RIGHT 3RD AND LEFT 2ND;  Surgeon: Samara Deist, DPM;  Location: [facility];  Service: Podiatry;  Laterality: Bilateral;  . APPENDECTOMY    . AV FISTULA PLACEMENT Left 06/09/2018   Procedure: ARTERIOVENOUS (AV) FISTULA CREATION ( BRACHIAL CEPHALIC);  Surgeon: Algernon Huxley, MD;  Location: [facility];  Service: Vascular;  Laterality: Left;  . CHOLECYSTECTOMY    . DIALYSIS/PERMA CATHETER INSERTION N/A 03/24/2018   Procedure: DIALYSIS/PERMA CATHETER INSERTION;  Surgeon: Katha Cabal, MD;  Location: [facility];  Service: Cardiovascular;  Laterality: N/A;  . INSERTION PROSTATE RADIATION SEED    . LOWER EXTREMITY ANGIOGRAPHY Right 06/16/2018    Procedure: LOWER EXTREMITY ANGIOGRAPHY;  Surgeon: Algernon Huxley, MD;  Location: [facility];  Service: Cardiovascular;  Laterality: Right;  . LOWER EXTREMITY ANGIOGRAPHY Left 08/30/2018   Procedure: LOWER EXTREMITY ANGIOGRAPHY;  Surgeon: Algernon Huxley, MD;  Location: [facility];  Service: Cardiovascular;  Laterality: Left;    Social History Social History   Tobacco Use  . Smoking status: Current Some Day Smoker    Packs/day: 0.10    Types: Cigarettes  . Smokeless tobacco: Never Used  Substance Use Topics  . Alcohol use: Not Currently    Alcohol/week: 0.0 standard drinks    Frequency: Never  . Drug use: Never    Family History Family History  Problem Relation Age of Onset  . Varicose Veins Neg Hx     No family history of bleeding or clotting disorders, autoimmune disease or porphyria  Allergies  Allergen Reactions  . Penicillin G Other (See Comments)    Other reaction(s): Unknown DID THE REACTION INVOLVE: Swelling of the face/tongue/throat, SOB, or low BP? Unknown Sudden or severe rash/hives, skin peeling, or the inside of the mouth or nose? Unknown Did it require medical treatment? Unknown When did it last happen?unknown If all above answers are "NO", may proceed with cephalosporin use.      REVIEW OF SYSTEMS (Negative unless checked)  Constitutional: [] Weight loss  [] Fever  [] Chills Cardiac: [] Chest pain   [] Chest pressure   [x] Palpitations   []   Shortness of breath when laying flat   [] Shortness of breath at rest   [x] Shortness of breath with exertion. Vascular:  [] Pain in legs with walking   [] Pain in legs at rest   [] Pain in legs when laying flat   [] Claudication   [] Pain in feet when walking  [] Pain in feet at rest  [] Pain in feet when laying flat   [] History of DVT   [] Phlebitis   [] Swelling in legs   [] Varicose veins   [x] Non-healing ulcers Pulmonary:   [] Uses home oxygen   [] Productive cough   [] Hemoptysis   [] Wheeze  [] COPD   [] Asthma Neurologic:   [] Dizziness  [] Blackouts   [] Seizures   [] History of stroke   [] History of TIA  [] Aphasia   [] Temporary blindness   [] Dysphagia   [] Weakness or numbness in arms   [] Weakness or numbness in legs Musculoskeletal:  [] Arthritis   [] Joint swelling   [] Joint pain   [] Low back pain Hematologic:  [] Easy bruising  [] Easy bleeding   [] Hypercoagulable state   [] Anemic  [] Hepatitis Gastrointestinal:  [] Blood in stool   [] Vomiting blood  [] Gastroesophageal reflux/heartburn   [] Difficulty swallowing. Genitourinary:  [x] Chronic kidney disease   [] Difficult urination  [] Frequent urination  [] Burning with urination   [] Blood in urine Skin:  [] Rashes   [x] Ulcers   [x] Wounds Psychological:  [] History of anxiety   []  History of major depression.  Physical Examination  Vitals:   09/06/18 1132  BP: (!) 155/108  Pulse: 89  Resp: 16  Temp: 98.8 F (37.1 C)  TempSrc: Oral  SpO2: 99%   There is no height or weight on file to calculate BMI. Gen: WD/WN, NAD Head: [place]/AT, No temporalis wasting.  Ear/Nose/Throat: Hearing grossly intact, nares w/o erythema or drainage, oropharynx w/o Erythema/Exudate,  Eyes: Conjunctiva clear, sclera non-icteric Neck: Trachea midline.  No JVD.  Pulmonary:  Good air movement, respirations not labored, no use of accessory muscles.  Cardiac: RRR, normal S1, S2. Vascular: good thrill in AVF Vessel Right Left  Radial Palpable Palpable   Musculoskeletal: M/S 5/5 throughout.  Extremities without ischemic changes.  No deformity or atrophy.  Neurologic: Sensation grossly intact in extremities.  Symmetrical.  Speech is fluent. Motor exam as listed above. Psychiatric: Judgment intact, Mood & affect appropriate for pt's clinical situation. Dermatologic: left heel ulcer and wound from digital amputation on right foot   CBC Lab Results  Component Value Date   WBC 8.1 07/08/2018   HGB 11.2 (L) 07/23/2018   HCT 33.0 (L) 07/23/2018   MCV 97.9 07/08/2018   PLT 176 07/08/2018    BMET     Component Value Date/Time   NA 139 07/23/2018 0845   NA 141 08/18/2011 1054   K 3.6 07/23/2018 0845   K 4.5 08/18/2011 1054   CL 101 07/08/2018 1255   CL 108 (H) 08/18/2011 1054   CO2 24 07/08/2018 1255   CO2 23 08/18/2011 1054   GLUCOSE 108 (H) 07/23/2018 0845   GLUCOSE 119 (H) 08/18/2011 1054   BUN 74 (H) 07/08/2018 1255   BUN 25 (H) 08/18/2011 1054   CREATININE 7.57 (H) 07/08/2018 1255   CREATININE 2.10 (H) 08/18/2011 1054   CALCIUM 8.7 (L) 07/08/2018 1255   CALCIUM 9.0 08/18/2011 1054   GFRNONAA 6 (L) 07/08/2018 1255   GFRNONAA 34 (L) 08/18/2011 1054   GFRAA 8 (L) 07/08/2018 1255   GFRAA 41 (L) 08/18/2011 1054   CrCl cannot be calculated (Patient's most recent lab result is older than the maximum  21 days allowed.).  COAG Lab Results  Component Value Date   INR 1.15 06/13/2018   INR 1.02 05/28/2018    Radiology Vas [place] Abi With/wo Tbi  Result Date: 08/24/2018 LOWER EXTREMITY DOPPLER STUDY Indications: Gangrene.  Vascular Interventions: 06/16/2018 PTA of left EIA with stent. PTA of right                         tibioperoneal trunk and proximal PTA. PTA of right                         popliteal artery and SFA. PTA of right EIA with stent. Comparison Study: 07/09/2018 Performing Technologist: Charlane Ferretti RT (R)(VS)  Examination Guidelines: A complete evaluation includes at minimum, Doppler waveform signals and systolic blood pressure reading at the level of bilateral brachial, anterior tibial, and posterior tibial arteries, when vessel segments are accessible. Bilateral testing is considered an integral part of a complete examination. Photoelectric Plethysmograph (PPG) waveforms and toe systolic pressure readings are included as required and additional duplex testing as needed. Limited examinations for reoccurring indications may be performed as noted.  ABI Findings: +---------+------------------+-----+----------+--------+ Right    Rt Pressure (mmHg)IndexWaveform  Comment   +---------+------------------+-----+----------+--------+ Brachial 138                                       +---------+------------------+-----+----------+--------+ ATA      141               1.02 monophasic         +---------+------------------+-----+----------+--------+ PTA      142               1.03 triphasic          +---------+------------------+-----+----------+--------+ Great Toe0                 0.00 Abnormal           +---------+------------------+-----+----------+--------+ +---------+------------------+-----+---------+-------+ Left     Lt Pressure (mmHg)IndexWaveform Comment +---------+------------------+-----+---------+-------+ Brachial                                 Fistula +---------+------------------+-----+---------+-------+ ATA      98                0.71 triphasic        +---------+------------------+-----+---------+-------+ PTA      105               0.76 triphasic        +---------+------------------+-----+---------+-------+ Great Toe45                0.33                  +---------+------------------+-----+---------+-------+ +-------+-----------+-----------+------------+------------+ ABI/TBIToday's ABIToday's TBIPrevious ABIPrevious TBI +-------+-----------+-----------+------------+------------+ Right  1.03       0.0        .98         .38          +-------+-----------+-----------+------------+------------+ Left   .76        .33        .87         .57          +-------+-----------+-----------+------------+------------+ Right ABIs appear essentially unchanged compared to prior study on 07/09/2018. Left ABIs appear decreased compared to  prior study on 07/09/2018. Bilateral TBI's have decreased since previous exam on 07/09/2018.  Summary: Right: Resting right ankle-brachial index is within normal range. No evidence of significant right lower extremity arterial disease. The right toe-brachial index is abnormal. Left: Resting left  ankle-brachial index indicates moderate left lower extremity arterial disease. The left toe-brachial index is abnormal.  *See table(s) above for measurements and observations.  Electronically signed by Leotis Pain MD on 08/24/2018 at 1:58:26 PM.    Final     Assessment/Plan 1.  Complication dialysis device:  Patient's Tunneled catheter is not being used. The patient has an extremity access that is functioning well. Therefore, the patient will undergo removal of the tunneled catheter under local anesthesia.  The risks and benefits were described to the patient.  All questions were answered.  The patient agrees to proceed with angiography and intervention. Potassium will be drawn to ensure that it is an appropriate level prior to performing intervention. 2.  End-stage renal disease requiring hemodialysis:  Patient will continue dialysis therapy without further interruption  3.  Hypertension:  Patient will continue medical management; nephrology is following no changes in oral medications. 4. Diabetes mellitus:  Glucose will be monitored and oral medications been held this morning once the patient has undergone the patient's procedure po intake will be reinitiated and again Accu-Cheks will be used to assess the blood glucose level and treat as needed. The patient will be restarted on the patient's usual hypoglycemic regime 5.  PAD with ulceration BLE: s/p BLE interventions.  Outpatient follow up.     Leotis Pain, MD  09/06/2018 12:01 PM

## 2018-09-06 NOTE — Discharge Instructions (Signed)
Tunneled Catheter Removal, Care After °Refer to this sheet in the next few weeks. These instructions provide you with information about caring for yourself after your procedure. Your health care provider may also give you more specific instructions. Your treatment has been planned according to current medical practices, but problems sometimes occur. Call your health care provider if you have any problems or questions after your procedure. °What can I expect after the procedure? °After the procedure, it is common to have: °· Some mild redness, swelling, and pain around your catheter site. ° ° °Follow these instructions at home: °Incision care  °· Check your removal site  every day for signs of infection. Check for: °¨ More redness, swelling, or pain. °¨ More fluid or blood. °¨ Warmth. °¨ Pus or a bad smell. °· Follow instructions from your health care provider about how to take care of your removal site. Make sure you: °¨ Wash your hands with soap and water before you change your bandages (dressings). If soap and water are not available, use hand sanitizer. °Activity  °· Return to your normal activities as told by your health care provider. Ask your health care provider what activities are safe for you. °· Do not lift anything that is heavier than 10 lb (4.5 kg) for 3 weeks or as long as told by your health care provider. ° °Contact a health care provider if: °· You have more fluid or blood coming from your removal site °· You have more redness, swelling, or pain at your incisions or around the area where your catheter was removed °· Your removal site feel warm to the touch. °· You feel unusually weak. °· You feel nauseous.. °· Get help right away if °· You have swelling in your arm, shoulder, neck, or face. °· You develop chest pain. °· You have difficulty breathing. °· You feel dizzy or light-headed. °· You have pus or a bad smell coming from your removal site °· You have a fever. °· You develop bleeding from your  removal site, and your bleeding does not stop. °This information is not intended to replace advice given to you by your health care provider. Make sure you discuss any questions you have with your health care provider. °Document Released: 05/05/2012 Document Revised: 01/20/2016 Document Reviewed: 02/12/2015 °Elsevier Interactive Patient Education © 2017 [employer] ° °

## 2018-09-06 NOTE — Op Note (Signed)
Operative Note     Preoperative diagnosis:   1. ESRD with functional permanent access  Postoperative diagnosis:  1. ESRD with functional permanent access  Procedure:  Removal of right jugular Permcath  Surgeon:  Leotis Pain, MD  Anesthesia:  Local  EBL: 5 cc  Indication for the Procedure:  The patient has a functional permanent dialysis access and no longer needs their permcath.  This can be removed.  Risks and benefits are discussed and informed consent is obtained.  Description of the Procedure:  The patient's right neck, chest and existing catheter were sterilely prepped and draped. The area around the catheter was anesthetized copiously with 1% lidocaine. The catheter was dissected out with curved hemostats until the cuff was freed from the surrounding fibrous sheath. The fiber sheath was transected, and the catheter was then removed in its entirety using gentle traction. Pressure was held and sterile dressings were placed. The patient tolerated the procedure well and was taken to the recovery room in stable condition.     Leotis Pain  09/06/2018, 12:23 PM This note was created with Dragon Medical transcription system. Any errors in dictation are purely unintentional.

## 2018-09-07 ENCOUNTER — Encounter: Payer: Self-pay | Admitting: Vascular Surgery

## 2018-09-09 ENCOUNTER — Telehealth (INDEPENDENT_AMBULATORY_CARE_PROVIDER_SITE_OTHER): Payer: Self-pay

## 2018-09-09 NOTE — Telephone Encounter (Signed)
Nurse is aware with medical advice

## 2018-09-13 ENCOUNTER — Telehealth (INDEPENDENT_AMBULATORY_CARE_PROVIDER_SITE_OTHER): Payer: Self-pay

## 2018-09-13 NOTE — Telephone Encounter (Signed)
Ben (PT) from [facility] left a message that he has called the patient and has not received a call back to confirm therapy session and there will be a miss visit

## 2018-09-17 ENCOUNTER — Telehealth (INDEPENDENT_AMBULATORY_CARE_PROVIDER_SITE_OTHER): Payer: Self-pay

## 2018-09-17 NOTE — Telephone Encounter (Signed)
Bring him in for ABIs with me or Dew next week

## 2018-09-20 ENCOUNTER — Telehealth (INDEPENDENT_AMBULATORY_CARE_PROVIDER_SITE_OTHER): Payer: Self-pay

## 2018-09-20 NOTE — Telephone Encounter (Signed)
Ok I will bring the prescription to you

## 2018-09-24 ENCOUNTER — Other Ambulatory Visit (INDEPENDENT_AMBULATORY_CARE_PROVIDER_SITE_OTHER): Payer: Self-pay | Admitting: Nurse Practitioner

## 2018-09-24 DIAGNOSIS — I96 Gangrene, not elsewhere classified: Secondary | ICD-10-CM

## 2018-09-27 ENCOUNTER — Ambulatory Visit (INDEPENDENT_AMBULATORY_CARE_PROVIDER_SITE_OTHER): Payer: Medicare Other | Admitting: Nurse Practitioner

## 2018-09-27 ENCOUNTER — Encounter (INDEPENDENT_AMBULATORY_CARE_PROVIDER_SITE_OTHER): Payer: Self-pay | Admitting: Nurse Practitioner

## 2018-09-27 ENCOUNTER — Encounter (INDEPENDENT_AMBULATORY_CARE_PROVIDER_SITE_OTHER): Payer: Self-pay

## 2018-09-27 ENCOUNTER — Other Ambulatory Visit: Payer: Self-pay

## 2018-09-27 ENCOUNTER — Ambulatory Visit (INDEPENDENT_AMBULATORY_CARE_PROVIDER_SITE_OTHER): Payer: Medicare Other

## 2018-09-27 ENCOUNTER — Other Ambulatory Visit (INDEPENDENT_AMBULATORY_CARE_PROVIDER_SITE_OTHER): Payer: Self-pay | Admitting: Nurse Practitioner

## 2018-09-27 VITALS — BP 160/68 | HR 64 | Resp 16

## 2018-09-27 DIAGNOSIS — L97919 Non-pressure chronic ulcer of unspecified part of right lower leg with unspecified severity: Secondary | ICD-10-CM

## 2018-09-27 DIAGNOSIS — F17210 Nicotine dependence, cigarettes, uncomplicated: Secondary | ICD-10-CM

## 2018-09-27 DIAGNOSIS — I96 Gangrene, not elsewhere classified: Secondary | ICD-10-CM | POA: Diagnosis not present

## 2018-09-27 DIAGNOSIS — I10 Essential (primary) hypertension: Secondary | ICD-10-CM

## 2018-09-27 DIAGNOSIS — L97929 Non-pressure chronic ulcer of unspecified part of left lower leg with unspecified severity: Secondary | ICD-10-CM

## 2018-09-27 DIAGNOSIS — Z7984 Long term (current) use of oral hypoglycemic drugs: Secondary | ICD-10-CM

## 2018-09-27 DIAGNOSIS — Z79899 Other long term (current) drug therapy: Secondary | ICD-10-CM

## 2018-09-27 DIAGNOSIS — E1122 Type 2 diabetes mellitus with diabetic chronic kidney disease: Secondary | ICD-10-CM

## 2018-09-27 DIAGNOSIS — I70262 Atherosclerosis of native arteries of extremities with gangrene, left leg: Secondary | ICD-10-CM | POA: Diagnosis not present

## 2018-09-27 DIAGNOSIS — Z95820 Peripheral vascular angioplasty status with implants and grafts: Secondary | ICD-10-CM | POA: Diagnosis not present

## 2018-09-27 DIAGNOSIS — I70261 Atherosclerosis of native arteries of extremities with gangrene, right leg: Secondary | ICD-10-CM

## 2018-09-27 DIAGNOSIS — I120 Hypertensive chronic kidney disease with stage 5 chronic kidney disease or end stage renal disease: Secondary | ICD-10-CM

## 2018-09-27 DIAGNOSIS — Z992 Dependence on renal dialysis: Secondary | ICD-10-CM

## 2018-09-27 DIAGNOSIS — N186 End stage renal disease: Secondary | ICD-10-CM

## 2018-09-27 MED ORDER — GABAPENTIN 300 MG PO CAPS: 300.0000 mg | ORAL_CAPSULE | Freq: Three times a day (TID) | ORAL | 3 refills | Status: DC

## 2018-09-27 MED ORDER — DOXYCYCLINE HYCLATE 100 MG PO CAPS: 100.0000 mg | ORAL_CAPSULE | Freq: Two times a day (BID) | ORAL | 0 refills | Status: DC

## 2018-09-27 NOTE — Progress Notes (Signed)
SUBJECTIVE:  Patient ID: Jose [name], male    DOB: [birth year], 73 y.o.   MRN: [id number] Chief Complaint  Patient presents with  . Follow-up    HPI  Jose [name] is a 73 y.o. male The patient is seen for evaluation of painful lower extremities and diminished pulses associated with ulceration of the foot.  The patient has necrotic changes of the right third digit as well as necrotic changes of the left foot on the first second and third digits.  The patient states that the feet are constantly aching and hurting as well as burning.  There is a foul-smelling odor coming from a open wound on his left foot located between his third and fourth toes.  The patient denies rest pain or dangling of an extremity off the side of the bed during the night for relief. The patient has had prior vascular angiograms due to peripheral artery disease.  The patient is also a dialysis patient. No history of back problems or DJD of the lumbar sacral spine.   The patient denies amaurosis fugax or recent TIA symptoms. There are no recent neurological changes noted. The patient denies history of DVT, PE or superficial thrombophlebitis. The patient denies recent episodes of angina or shortness of breath.   The patient underwent ABIs today which reveals an ABI 1.02 on the right and 1.03 on the left.  His TBI on the right is 0.11 with a TBI of 0.39 on the left.  His previous ABI done on 08/20/2018 was 1.03 on the right and 0.76 on the left.  He also has triphasic tibial artery waveforms bilaterally.  Past Medical History:  Diagnosis Date  . Chronic kidney disease   . Depression   . Diabetes mellitus without complication ([place])   . Glaucoma   . Glaucoma   . Gout   . Heart murmur   . HLD (hyperlipidemia)   . Hypertension   . Prostate cancer ([place])   . Renal failure    dialysis t/t/s  . Stroke ([place])    tia x 2    Past Surgical History:  Procedure Laterality Date  . 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 APPENDECTOMY    . AV FISTULA PLACEMENT Left 06/09/2018   Procedure: ARTERIOVENOUS (AV) FISTULA CREATION ( BRACHIAL CEPHALIC);  Surgeon: Algernon Huxley, MD;  Location: [facility];  Service: Vascular;  Laterality: Left;  . CHOLECYSTECTOMY    . DIALYSIS/PERMA CATHETER INSERTION N/A 03/24/2018   Procedure: DIALYSIS/PERMA CATHETER INSERTION;  Surgeon: Katha Cabal, MD;  Location: [facility];  Service: Cardiovascular;  Laterality: N/A;  . DIALYSIS/PERMA CATHETER REMOVAL N/A 09/06/2018   Procedure: DIALYSIS/PERMA CATHETER REMOVAL;  Surgeon: Algernon Huxley, MD;  Location: [facility];  Service: Cardiovascular;  Laterality: N/A;  . INSERTION PROSTATE RADIATION SEED    . LOWER EXTREMITY ANGIOGRAPHY Right 06/16/2018   Procedure: LOWER EXTREMITY ANGIOGRAPHY;  Surgeon: Algernon Huxley, MD;  Location: [place];  Service: Cardiovascular;  Laterality: Right;  . LOWER EXTREMITY ANGIOGRAPHY Left 08/30/2018   Procedure: LOWER EXTREMITY ANGIOGRAPHY;  Surgeon: Algernon Huxley, MD;  Location: [facility];  Service: Cardiovascular;  Laterality: Left;    Social History   Socioeconomic History  . Marital status: Widowed    Spouse name: Not on file  . Number of  children: Not on file  . Years of education: Not on file  . Highest education level: Not on file  Occupational History    Employer: RETIRED  Social Needs  . Financial resource strain: Not on file  . Food insecurity:    Worry: Not on file    Inability: Not on file  . Transportation needs:    Medical: Not on file    Non-medical: Not on file  Tobacco Use  . Smoking status: Current Some Day Smoker    Packs/day: 0.10    Types: Cigarettes  . Smokeless tobacco: Never Used  Substance and Sexual Activity  . Alcohol use: Not Currently    Alcohol/week: 0.0 standard drinks    Frequency:  Never  . Drug use: Never  . Sexual activity: Not Currently  Lifestyle  . Physical activity:    Days per week: Not on file    Minutes per session: Not on file  . Stress: Not on file  Relationships  . Social connections:    Talks on phone: Not on file    Gets together: Not on file    Attends religious service: Not on file    Active member of club or organization: Not on file    Attends meetings of clubs or organizations: Not on file    Relationship status: Not on file  . Intimate partner violence:    Fear of current or ex partner: Not on file    Emotionally abused: Not on file    Physically abused: Not on file    Forced sexual activity: Not on file  Other Topics Concern  . Not on file  Social History Narrative  . Not on file    Family History  Problem Relation Age of Onset  . Varicose Veins Neg Hx     Allergies  Allergen Reactions  . Penicillin G Other (See Comments)    Other reaction(s): Unknown DID THE REACTION INVOLVE: Swelling of the face/tongue/throat, SOB, or low BP? Unknown Sudden or severe rash/hives, skin peeling, or the inside of the mouth or nose? Unknown Did it require medical treatment? Unknown When did it last happen?unknown If all above answers are "NO", may proceed with cephalosporin use.      Review of Systems   Review of Systems: Negative Unless Checked Constitutional: [] Weight loss  [] Fever  [] Chills Cardiac: [] Chest pain   []  Atrial Fibrillation  [] Palpitations   [] Shortness of breath when laying flat   [] Shortness of breath with exertion. [] Shortness of breath at rest Vascular:  [] Pain in legs with walking   [] Pain in legs with standing [] Pain in legs when laying flat   [] Claudication    [x] Pain in feet when laying flat    [] History of DVT   [] Phlebitis   [x] Swelling in legs   [] Varicose veins   [] Non-healing ulcers Pulmonary:   [] Uses home oxygen   [] Productive cough   [] Hemoptysis   [] Wheeze  [] COPD   [] Asthma Neurologic:  [] Dizziness    [] Seizures  [] Blackouts [] History of stroke   [] History of TIA  [] Aphasia   [] Temporary Blindness   [] Weakness or numbness in arm   [x] Weakness or numbness in leg Musculoskeletal:   [] Joint swelling   [] Joint pain   [] Low back pain  []  History of Knee Replacement [] Arthritis [] back Surgeries  []  Spinal Stenosis    Hematologic:  [] Easy bruising  [] Easy bleeding   [] Hypercoagulable state   [] Anemic Gastrointestinal:  [] Diarrhea   [] Vomiting  [] Gastroesophageal reflux/heartburn   [] Difficulty  swallowing. [] Abdominal pain Genitourinary:  [] Chronic kidney disease   [] Difficult urination  [] Anuric   [] Blood in urine [] Frequent urination  [] Burning with urination   [] Hematuria Skin:  [] Rashes   [] Ulcers [x] Wounds Psychological:  [] History of anxiety   []  History of major depression  []  Memory Difficulties       OBJECTIVE:   Physical Exam  BP (!) 160/68 (BP Location: Right Arm)   Pulse 64   Resp 16   Gen: WD/WN, NAD, cachectic appearing Head: [place]/AT, No temporalis wasting.  Ear/Nose/Throat: Hearing grossly intact, nares w/o erythema or drainage Eyes: PER, EOMI, sclera nonicteric.  Neck: Supple, no masses.  No JVD.  Pulmonary:  Good air movement, no use of accessory muscles.  Cardiac: RRR Vascular:  Necrotic changes on right foot third toe.  Left foot first second and third digit has necrotic changes as well.  There is also an open ulceration located between his third and fourth toes.  Foul-smelling odor admitted from it. Vessel Right Left  Radial Palpable Palpable  Dorsalis Pedis Not Palpable Not Palpable  Posterior Tibial Not Palpable Not Palpable   Gastrointestinal: soft, non-distended. No guarding/no peritoneal signs.  Musculoskeletal: Wheelchair-bound No deformity or atrophy.  Neurologic: Pain and light touch intact in extremities.  Symmetrical.  Speech is fluent. Motor exam as listed above. Psychiatric: Judgment intact, Mood & affect appropriate for pt's clinical situation.  Dermatologic:  See description above for wounds. No changes consistent with cellulitis. Lymph : No Cervical lymphadenopathy, no lichenification or skin changes of chronic lymphedema.       ASSESSMENT AND PLAN:  1. 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  His TBI on the right is 0.11 with a TBI of 0.39 on the left.  His previous ABI done on 08/20/2018 was 1.03 on the right and 0.76 on the left.  He also has triphasic tibial artery waveforms bilaterally.   Recommend:  The patient has evidence of severe atherosclerotic changes of both lower extremities associated with ulceration and tissue loss of the foot.  This represents a limb threatening ischemia and places the patient at the risk for limb loss.  Patient should undergo angiography of the lower extremities with the hope for intervention for limb salvage.  The risks and benefits as well as the alternative therapies was discussed in detail with the patient.  All questions were answered.  Patient agrees to proceed with angiography.  The patient will follow up with me in the office after the procedure.   - gabapentin (NEURONTIN) 300 MG capsule; Take 1 capsule (300 mg total) by mouth 3 (three) times daily.  Dispense: 90 capsule; Refill: 3 - doxycycline (VIBRAMYCIN) 100 MG capsule; Take 1 capsule (100 mg total) by mouth 2 (two) times daily.  Dispense: 28 capsule; Refill: 0  2. Type 2 diabetes mellitus with chronic kidney disease on chronic dialysis, unspecified whether long term insulin use ([place]) Continue hypoglycemic medications as already ordered, these medications have been reviewed and there are no changes at this time.  Hgb A1C to be monitored as already arranged by primary service   3. Essential hypertension Continue antihypertensive medications as already ordered, these medications have been reviewed and  there are no changes at this time.   4. ESRD on dialysis [facility]) Currently access is functioning without issue.  Current Outpatient Medications on File Prior to Visit  Medication Sig Dispense  Refill  . allopurinol (ZYLOPRIM) 100 MG tablet Take 100 mg by mouth 2 (two) times daily.     Marland Kitchen amLODipine (NORVASC) 10 MG tablet Take 10 mg by mouth daily.     Marland Kitchen aspirin EC 81 MG EC tablet Take 1 tablet (81 mg total) by mouth daily.    Marland Kitchen atorvastatin (LIPITOR) 10 MG tablet Take 1 tablet (10 mg total) by mouth daily at 6 PM. 90 tablet 3  . cloNIDine (CATAPRES) 0.2 MG tablet TAKE 1 TABLET BY MOUTH TWICE A DAY 60 tablet 0  . clopidogrel (PLAVIX) 75 MG tablet Take 1 tablet (75 mg total) by mouth daily. 30 tablet 2  . escitalopram (LEXAPRO) 10 MG tablet Take 10 mg by mouth daily.     Marland Kitchen glipiZIDE (GLUCOTROL) 5 MG tablet Take by mouth daily before breakfast.    . hydrALAZINE (APRESOLINE) 25 MG tablet Take 1 tablet (25 mg total) by mouth every 8 (eight) hours. 90 tablet 0  . multivitamin (RENA-VIT) TABS tablet Take 1 tablet by mouth at bedtime. 30 tablet 0  . Nutritional Supplements (FEEDING SUPPLEMENT, NEPRO CARB STEADY,) LIQD Take 237 mLs by mouth 2 (two) times daily between meals. 60 Can 0  . oxyCODONE-acetaminophen (PERCOCET) 7.5-325 MG tablet Take 1 tablet by mouth every 4 (four) hours as needed for severe pain. 30 tablet 0   No current facility-administered medications on file prior to visit.     There are no Patient Instructions on file for this visit. No follow-ups on file.   Kris Hartmann, NP  This note was completed with Sales executive.  Any errors are purely unintentional.

## 2018-09-29 ENCOUNTER — Encounter (INDEPENDENT_AMBULATORY_CARE_PROVIDER_SITE_OTHER): Payer: Medicare Other

## 2018-09-29 ENCOUNTER — Ambulatory Visit (INDEPENDENT_AMBULATORY_CARE_PROVIDER_SITE_OTHER): Payer: Medicare Other | Admitting: Nurse Practitioner

## 2018-09-29 MED ORDER — CLINDAMYCIN PHOSPHATE 300 MG/50ML IV SOLN
300.0000 mg | Freq: Once | INTRAVENOUS | Status: AC
  Administered 2018-09-30: 08:00:00 300 mg via INTRAVENOUS

## 2018-09-30 ENCOUNTER — Encounter
Admission: RE | Disposition: A | Discharge status: Home / Self Care | Payer: Self-pay | Source: Home / Self Care | Attending: Vascular Surgery

## 2018-09-30 ENCOUNTER — Other Ambulatory Visit (INDEPENDENT_AMBULATORY_CARE_PROVIDER_SITE_OTHER): Payer: Self-pay | Admitting: Nurse Practitioner

## 2018-09-30 ENCOUNTER — Ambulatory Visit
Admission: RE | Admit: 2018-09-30 | Discharge: 2018-09-30 | Disposition: A | Discharge status: Home / Self Care | Payer: Medicare Other | Attending: Vascular Surgery | Admitting: Vascular Surgery

## 2018-09-30 ENCOUNTER — Other Ambulatory Visit: Payer: Self-pay

## 2018-09-30 DIAGNOSIS — E785 Hyperlipidemia, unspecified: Secondary | ICD-10-CM | POA: Diagnosis not present

## 2018-09-30 DIAGNOSIS — F329 Major depressive disorder, single episode, unspecified: Secondary | ICD-10-CM | POA: Diagnosis not present

## 2018-09-30 DIAGNOSIS — Z7982 Long term (current) use of aspirin: Secondary | ICD-10-CM | POA: Diagnosis not present

## 2018-09-30 DIAGNOSIS — Z8673 Personal history of transient ischemic attack (TIA), and cerebral infarction without residual deficits: Secondary | ICD-10-CM | POA: Insufficient documentation

## 2018-09-30 DIAGNOSIS — Z992 Dependence on renal dialysis: Secondary | ICD-10-CM | POA: Diagnosis not present

## 2018-09-30 DIAGNOSIS — F17210 Nicotine dependence, cigarettes, uncomplicated: Secondary | ICD-10-CM | POA: Insufficient documentation

## 2018-09-30 DIAGNOSIS — I70263 Atherosclerosis of native arteries of extremities with gangrene, bilateral legs: Secondary | ICD-10-CM | POA: Diagnosis not present

## 2018-09-30 DIAGNOSIS — E1122 Type 2 diabetes mellitus with diabetic chronic kidney disease: Secondary | ICD-10-CM | POA: Insufficient documentation

## 2018-09-30 DIAGNOSIS — Z79899 Other long term (current) drug therapy: Secondary | ICD-10-CM | POA: Diagnosis not present

## 2018-09-30 DIAGNOSIS — L97523 Non-pressure chronic ulcer of other part of left foot with necrosis of muscle: Secondary | ICD-10-CM | POA: Insufficient documentation

## 2018-09-30 DIAGNOSIS — N189 Chronic kidney disease, unspecified: Secondary | ICD-10-CM | POA: Insufficient documentation

## 2018-09-30 DIAGNOSIS — I70299 Other atherosclerosis of native arteries of extremities, unspecified extremity: Secondary | ICD-10-CM

## 2018-09-30 DIAGNOSIS — Z8546 Personal history of malignant neoplasm of prostate: Secondary | ICD-10-CM | POA: Insufficient documentation

## 2018-09-30 DIAGNOSIS — L97513 Non-pressure chronic ulcer of other part of right foot with necrosis of muscle: Secondary | ICD-10-CM | POA: Diagnosis not present

## 2018-09-30 DIAGNOSIS — Z7984 Long term (current) use of oral hypoglycemic drugs: Secondary | ICD-10-CM | POA: Insufficient documentation

## 2018-09-30 DIAGNOSIS — L97909 Non-pressure chronic ulcer of unspecified part of unspecified lower leg with unspecified severity: Secondary | ICD-10-CM

## 2018-09-30 DIAGNOSIS — I129 Hypertensive chronic kidney disease with stage 1 through stage 4 chronic kidney disease, or unspecified chronic kidney disease: Secondary | ICD-10-CM | POA: Insufficient documentation

## 2018-09-30 DIAGNOSIS — E1152 Type 2 diabetes mellitus with diabetic peripheral angiopathy with gangrene: Secondary | ICD-10-CM | POA: Diagnosis not present

## 2018-09-30 DIAGNOSIS — M109 Gout, unspecified: Secondary | ICD-10-CM | POA: Diagnosis not present

## 2018-09-30 HISTORY — PX: LOWER EXTREMITY ANGIOGRAPHY: CATH118251

## 2018-09-30 LAB — GLUCOSE, CAPILLARY
Glucose-Capillary: 82 mg/dL (ref 70–99)
Glucose-Capillary: 102 mg/dL — ABNORMAL HIGH (ref 70–99)

## 2018-09-30 LAB — POTASSIUM (ARMC VASCULAR LAB ONLY): Potassium (ARMC vascular lab): 3.5 (ref 3.5–5.1)

## 2018-09-30 SURGERY — LOWER EXTREMITY ANGIOGRAPHY
Anesthesia: Moderate Sedation | Site: Leg Lower | Laterality: Right

## 2018-09-30 MED ORDER — HEPARIN (PORCINE) IN NACL 1000-0.9 UT/500ML-% IV SOLN
INTRAVENOUS | Status: AC
  Filled 2018-09-30: qty 1000

## 2018-09-30 MED ORDER — MIDAZOLAM HCL 2 MG/ML PO SYRP: 8.0000 mg | ORAL_SOLUTION | Freq: Once | ORAL | Status: DC | PRN

## 2018-09-30 MED ORDER — CLINDAMYCIN PHOSPHATE 300 MG/50ML IV SOLN
INTRAVENOUS | Status: AC
  Administered 2018-09-30: 08:00:00 300 mg via INTRAVENOUS
  Filled 2018-09-30: qty 50

## 2018-09-30 MED ORDER — FAMOTIDINE 20 MG PO TABS: 40.0000 mg | ORAL_TABLET | Freq: Once | ORAL | Status: DC | PRN

## 2018-09-30 MED ORDER — HYDRALAZINE HCL 20 MG/ML IJ SOLN
INTRAMUSCULAR | Status: AC
  Filled 2018-09-30: qty 1

## 2018-09-30 MED ORDER — MIDAZOLAM HCL 5 MG/5ML IJ SOLN
INTRAMUSCULAR | Status: AC
  Filled 2018-09-30: qty 5

## 2018-09-30 MED ORDER — LIDOCAINE-EPINEPHRINE (PF) 1 %-1:200000 IJ SOLN
INTRAMUSCULAR | Status: AC
  Filled 2018-09-30: qty 30

## 2018-09-30 MED ORDER — ACETAMINOPHEN 325 MG PO TABS: 650.0000 mg | ORAL_TABLET | ORAL | Status: DC | PRN

## 2018-09-30 MED ORDER — MIDAZOLAM HCL 2 MG/2ML IJ SOLN
INTRAMUSCULAR | Status: DC | PRN
  Administered 2018-09-30: 2 mg via INTRAVENOUS
  Administered 2018-09-30: 1 mg via INTRAVENOUS

## 2018-09-30 MED ORDER — METHYLPREDNISOLONE SODIUM SUCC 125 MG IJ SOLR: 125.0000 mg | Freq: Once | INTRAMUSCULAR | Status: DC | PRN

## 2018-09-30 MED ORDER — DIPHENHYDRAMINE HCL 50 MG/ML IJ SOLN: 50.0000 mg | Freq: Once | INTRAMUSCULAR | Status: DC | PRN

## 2018-09-30 MED ORDER — HEPARIN SODIUM (PORCINE) 1000 UNIT/ML IJ SOLN
INTRAMUSCULAR | Status: AC
  Filled 2018-09-30: qty 1

## 2018-09-30 MED ORDER — LIDOCAINE HCL (PF) 1 % IJ SOLN
INTRAMUSCULAR | Status: AC
  Filled 2018-09-30: qty 30

## 2018-09-30 MED ORDER — CLINDAMYCIN PHOSPHATE 300 MG/50ML IV SOLN
INTRAVENOUS | Status: AC
  Filled 2018-09-30: qty 50

## 2018-09-30 MED ORDER — SODIUM CHLORIDE 0.9 % IV SOLN: 250.0000 mL | INTRAVENOUS | Status: DC | PRN

## 2018-09-30 MED ORDER — ONDANSETRON HCL 4 MG/2ML IJ SOLN: 4.0000 mg | Freq: Four times a day (QID) | INTRAMUSCULAR | Status: DC | PRN

## 2018-09-30 MED ORDER — SODIUM CHLORIDE 0.9 % IV SOLN
INTRAVENOUS | Status: DC
  Administered 2018-09-30: 08:00:00 via INTRAVENOUS

## 2018-09-30 MED ORDER — LABETALOL HCL 5 MG/ML IV SOLN: 10.0000 mg | Freq: Once | INTRAVENOUS | Status: DC

## 2018-09-30 MED ORDER — HYDRALAZINE HCL 20 MG/ML IJ SOLN: 5.0000 mg | INTRAMUSCULAR | Status: DC | PRN

## 2018-09-30 MED ORDER — LABETALOL HCL 5 MG/ML IV SOLN: 10.0000 mg | INTRAVENOUS | Status: DC | PRN

## 2018-09-30 MED ORDER — FENTANYL CITRATE (PF) 100 MCG/2ML IJ SOLN
INTRAMUSCULAR | Status: DC | PRN
  Administered 2018-09-30 (×2): 25 ug via INTRAVENOUS

## 2018-09-30 MED ORDER — SODIUM CHLORIDE 0.9% FLUSH: 3.0000 mL | Freq: Two times a day (BID) | INTRAVENOUS | Status: DC

## 2018-09-30 MED ORDER — HYDROMORPHONE HCL 1 MG/ML IJ SOLN: 1.0000 mg | Freq: Once | INTRAMUSCULAR | Status: DC | PRN

## 2018-09-30 MED ORDER — SODIUM CHLORIDE 0.9 % IV SOLN: INTRAVENOUS | Status: DC

## 2018-09-30 MED ORDER — FENTANYL CITRATE (PF) 100 MCG/2ML IJ SOLN
INTRAMUSCULAR | Status: AC
  Filled 2018-09-30: qty 2

## 2018-09-30 MED ORDER — SODIUM CHLORIDE 0.9% FLUSH: 3.0000 mL | INTRAVENOUS | Status: DC | PRN

## 2018-09-30 SURGICAL SUPPLY — 16 items
BALLN LUTONIX DCB 4X80X130 (BALLOONS) ×3
BALLOON LUTONIX DCB 4X80X130 (BALLOONS) ×1 IMPLANT
CATH CXI SUPP ANG 4FR 135 (CATHETERS) ×1 IMPLANT
CATH CXI SUPP ANG 4FR 135CM (CATHETERS) ×3
CATH PIG 70CM (CATHETERS) ×3 IMPLANT
CATH VERT 5FR 125CM (CATHETERS) ×3 IMPLANT
DEVICE PRESTO INFLATION (MISCELLANEOUS) ×3 IMPLANT
DEVICE STARCLOSE SE CLOSURE (Vascular Products) ×3 IMPLANT
GLIDEWIRE ADV .035X260CM (WIRE) ×3 IMPLANT
GUIDEWIRE PFTE-COATED .018X300 (WIRE) ×3 IMPLANT
PACK ANGIOGRAPHY (CUSTOM PROCEDURE TRAY) ×3 IMPLANT
SHEATH ANL 5FRX90 (SHEATH) ×3 IMPLANT
SHEATH BRITE TIP 5FRX11 (SHEATH) ×3 IMPLANT
SYR MEDRAD MARK 7 150ML (SYRINGE) ×3 IMPLANT
TUBING CONTRAST HIGH PRESS 72 (TUBING) ×3 IMPLANT
WIRE J 3MM .035X145CM (WIRE) ×3 IMPLANT

## 2018-09-30 NOTE — Discharge Instructions (Signed)
Femoral Site Care °This sheet gives you information about how to care for yourself after your procedure. Your health care provider may also give you more specific instructions. If you have problems or questions, contact your health care provider. °What can I expect after the procedure? °After the procedure, it is common to have: °· Bruising that usually fades within 1-2 weeks. °· Tenderness at the site. °Follow these instructions at home: °Wound care °· Follow instructions from your health care provider about how to take care of your insertion site. Make sure you: °? Wash your hands with soap and water before you change your bandage (dressing). If soap and water are not available, use hand sanitizer. °? Change your dressing as told by your health care provider. °? Leave stitches (sutures), skin glue, or adhesive strips in place. These skin closures may need to stay in place for 2 weeks or longer. If adhesive strip edges start to loosen and curl up, you may trim the loose edges. Do not remove adhesive strips completely unless your health care provider tells you to do that. °· Do not take baths, swim, or use a hot tub until your health care provider approves. °· You may shower 24-48 hours after the procedure or as told by your health care provider. °? Gently wash the site with plain soap and water. °? Pat the area dry with a clean towel. °? Do not rub the site. This may cause bleeding. °· Do not apply powder or lotion to the site. Keep the site clean and dry. °· Check your femoral site every day for signs of infection. Check for: °? Redness, swelling, or pain. °? Fluid or blood. °? Warmth. °? Pus or a bad smell. °Activity °· For the first 2-3 days after your procedure, or as long as directed: °? Avoid climbing stairs as much as possible. °? Do not squat. °· Do not lift anything that is heavier than 10 lb (4.5 kg), or the limit that you are told, until your health care provider says that it is safe. °· Rest as  directed. °? Avoid sitting for a long time without moving. Get up to take short walks every 1-2 hours. °· Do not drive for 24 hours if you were given a medicine to help you relax (sedative). °General instructions °· Take over-the-counter and prescription medicines only as told by your health care provider. °· Keep all follow-up visits as told by your health care provider. This is important. °Contact a health care provider if you have: °· A fever or chills. °· You have redness, swelling, or pain around your insertion site. °Get help right away if: °· The catheter insertion area swells very fast. °· You pass out. °· You suddenly start to sweat or your skin gets clammy. °· The catheter insertion area is bleeding, and the bleeding does not stop when you hold steady pressure on the area. °· The area near or just beyond the catheter insertion site becomes pale, cool, tingly, or numb. °These symptoms may represent a serious problem that is an emergency. Do not wait to see if the symptoms will go away. Get medical help right away. Call your local emergency services (911 in the [place] Do not drive yourself to the hospital. °Summary °· After the procedure, it is common to have bruising that usually fades within 1-2 weeks. °· Check your femoral site every day for signs of infection. °· Do not lift anything that is heavier than 10 lb (4.5 kg), or the   limit that you are told, until your health care provider says that it is safe. This information is not intended to replace advice given to you by your health care provider. Make sure you discuss any questions you have with your health care provider. Document Released: 01/20/2014 Document Revised: 06/01/2017 Document Reviewed: 06/01/2017 Elsevier Interactive Patient Education  2019 [employer]. Moderate Conscious Sedation, Adult, Care After These instructions provide you with information about caring for yourself after your procedure. Your health care provider may also give  you more specific instructions. Your treatment has been planned according to current medical practices, but problems sometimes occur. Call your health care provider if you have any problems or questions after your procedure. What can I expect after the procedure? After your procedure, it is common:  To feel sleepy for several hours.  To feel clumsy and have poor balance for several hours.  To have poor judgment for several hours.  To vomit if you eat too soon. Follow these instructions at home: For at least 24 hours after the procedure:   Do not: ? Participate in activities where you could fall or become injured. ? Drive. ? Use heavy machinery. ? Drink alcohol. ? Take sleeping pills or medicines that cause drowsiness. ? Make important decisions or sign legal documents. ? Take care of children on your own.  Rest. Eating and drinking  Follow the diet recommended by your health care provider.  If you vomit: ? Drink water, juice, or soup when you can drink without vomiting. ? Make sure you have little or no nausea before eating solid foods. General instructions  Have a responsible adult stay with you until you are awake and alert.  Take over-the-counter and prescription medicines only as told by your health care provider.  If you smoke, do not smoke without supervision.  Keep all follow-up visits as told by your health care provider. This is important. Contact a health care provider if:  You keep feeling nauseous or you keep vomiting.  You feel light-headed.  You develop a rash.  You have a fever. Get help right away if:  You have trouble breathing. This information is not intended to replace advice given to you by your health care provider. Make sure you discuss any questions you have with your health care provider. Document Released: 03/09/2013 Document Revised: 10/22/2015 Document Reviewed: 09/08/2015 Elsevier Interactive Patient Education  2019 [place] After This sheet gives you information about how to care for yourself after your procedure. Your doctor may also give you more specific instructions. If you have problems or questions, contact your doctor. Follow these instructions at home: Insertion site care  Follow instructions from your doctor about how to take care of your long, thin tube (catheter) insertion area. Make sure you: ? Wash your hands with soap and water before you change your bandage (dressing). If you cannot use soap and water, use hand sanitizer. ? Change your bandage as told by your doctor. ? Leave stitches (sutures), skin glue, or skin tape (adhesive) strips in place. They may need to stay in place for 2 weeks or longer. If tape strips get loose and curl up, you may trim the loose edges. Do not remove tape strips completely unless your doctor says it is okay.  Do not take baths, swim, or use a hot tub until your doctor says it is okay.  You may shower 24-48 hours after the procedure or as told by your doctor. ? Gently wash  the area with plain soap and water. ? Pat the area dry with a clean towel. ? Do not rub the area. This may cause bleeding.  Do not apply powder or lotion to the area. Keep the area clean and dry.  Check your insertion area every day for signs of infection. Check for: ? More redness, swelling, or pain. ? Fluid or blood. ? Warmth. ? Pus or a bad smell. Activity  Rest as told by your doctor, usually for 1-2 days.  Do not lift anything that is heavier than 10 lbs. (4.5 kg) or as told by your doctor.  Do not drive for 24 hours if you were given a medicine to help you relax (sedative).  Do not drive or use heavy machinery while taking prescription pain medicine. General instructions   Go back to your normal activities as told by your doctor, usually in about a week. Ask your doctor what activities are safe for you.  If the insertion area starts to bleed, lie flat and put  pressure on the area. If the bleeding does not stop, get help right away. This is an emergency.  Drink enough fluid to keep your pee (urine) clear or pale yellow.  Take over-the-counter and prescription medicines only as told by your doctor.  Keep all follow-up visits as told by your doctor. This is important. Contact a doctor if:  You have a fever.  You have chills.  You have more redness, swelling, or pain around your insertion area.  You have fluid or blood coming from your insertion area.  The insertion area feels warm to the touch.  You have pus or a bad smell coming from your insertion area.  You have more bruising around the insertion area.  Blood collects in the tissue around the insertion area (hematoma) that may be painful to the touch. Get help right away if:  You have a lot of pain in the insertion area.  The insertion area swells very fast.  The insertion area is bleeding, and the bleeding does not stop after holding steady pressure on the area.  The area near or just beyond the insertion area becomes pale, cool, tingly, or numb. These symptoms may be an emergency. Do not wait to see if the symptoms will go away. Get medical help right away. Call your local emergency services (911 in the [place] Do not drive yourself to the hospital. Summary  After the procedure, it is common to have bruising and tenderness at the long, thin tube insertion area.  After the procedure, it is important to rest and drink plenty of fluids.  Do not take baths, swim, or use a hot tub until your doctor says it is okay to do so. You may shower 24-48 hours after the procedure or as told by your doctor.  If the insertion area starts to bleed, lie flat and put pressure on the area. If the bleeding does not stop, get help right away. This is an emergency. This information is not intended to replace advice given to you by your health care provider. Make sure you discuss any questions you have  with your health care provider. Document Released: 08/15/2008 Document Revised: 05/13/2016 Document Reviewed: 05/13/2016 Elsevier Interactive Patient Education  2019 [employer].

## 2018-09-30 NOTE — Progress Notes (Signed)
Noticed patient's heart rhythm is A-flutter. Sela Hua, NP for EKG order and cardiology referral. Notified sedating RN and Lucky Cowboy, MD in reference to today's procedure.

## 2018-09-30 NOTE — Op Note (Signed)
[facility]  Percutaneous Study/Intervention Procedural Note   Date of Surgery: 09/30/2018  Surgeon(s):,    Assistants:none  Pre-operative Diagnosis: PAD with gangrene BLE  Post-operative diagnosis:  Same  Procedure(s) Performed:             1.  Ultrasound guidance for vascular access left femoral artery             2.  Catheter placement into right SFA from left femoral approach             3.  Aortogram and selective right lower extremity angiogram             4.  Percutaneous transluminal angioplasty of right tibioperoneal trunk with 4 mm diameter by 10 cm length Lutonix drug-coated angioplasty balloon             5.  StarClose closure device left femoral artery  EBL: 5 cc  Contrast: 75 cc  Fluoro Time: 10.6 minutes  Moderate Conscious Sedation Time: approximately 35 minutes using 3 mg of Versed and 50 mcg of Fentanyl              Indications:  Patient is a 73 y.o.male with a significant history of PAD and previous interventions bilaterally. The patient has noninvasive study showing relatively preserved ABIs but his digital pressures were reduced particularly on the right.  He still has gangrenous changes to both feet. The patient is brought in for angiography for further evaluation and potential treatment.  Due to the limb threatening nature of the situation, angiogram was performed for attempted limb salvage. The patient is aware that if the procedure fails, amputation would be expected.  The patient also understands that even with successful revascularization, amputation may still be required due to the severity of the situation.  Risks and benefits are discussed and informed consent is obtained.   Procedure:  The patient was identified and appropriate procedural time out was performed.  The patient was then placed supine on the table and prepped and draped in the usual sterile fashion. Moderate conscious sedation was administered during a face to  face encounter with the patient throughout the procedure with my supervision of the RN administering medicines and monitoring the patient's vital signs, pulse oximetry, telemetry and mental status throughout from the start of the procedure until the patient was taken to the recovery room. Ultrasound was used to evaluate the left common femoral artery.  It was patent .  A digital ultrasound image was acquired.  A Seldinger needle was used to access the left common femoral artery under direct ultrasound guidance and a permanent image was performed.  A 0.035 J wire was advanced without resistance and a 5Fr sheath was placed.  Pigtail catheter was placed into the aorta and an AP aortogram was performed. This demonstrated extremely slow circulation time but the renal arteries appeared patent, the aorta and iliac arteries were patent without significant stenosis including the previously placed stents in the iliac arteries. I then crossed the aortic bifurcation and advanced to the right femoral head. Selective right lower extremity angiogram was then performed.  After the first injection in the common femoral artery showed no stenosis in the common femoral, profunda femoris, or proximal SFA, the catheter was then advanced into the proximal to mid SFA to help opacify distally due to the slow circulation time.  This demonstrated no significant stenosis in the SFA or popliteal arteries.  The anterior tibial artery was occluded but the posterior  tibial and peroneal arteries were continuous distally.  Those 2 vessels did not have any clear narrowing, but the tibioperoneal trunk did have a moderate stenosis in the 55 to 60% range. It was felt that it was in the patient's best interest to proceed with intervention after these images to avoid a second procedure and a larger amount of contrast and fluoroscopy based off of the findings from the initial angiogram. The patient was systemically heparinized and a 5 French 90 cm sheath  was then placed over the Terumo Advantage wire. I then used a Kumpe catheter and the advantage wire to navigate down into the tibial vessels and cross the stenosis in the tibioperoneal trunk.  The wire was parked in the peroneal artery.  A 4 mm diameter by 10 cm length Lutonix drug-coated angioplasty balloon was then inflated in the tibioperoneal trunk and taken up to 12 atm for 1 minute.  A waist was seen which resolved with angioplasty.  Completion imaging showed only about a 20% residual stenosis with brisk flow.  I then used the 125 Kumpe catheter as well as a 40 CXI catheter was able to get into the origin of the anterior tibial artery.  Selective imaging showed a long segment occlusion of the anterior tibial artery there was reconstitution in the foot.  Multiple attempts including a 0.035 advantage wire and a 0.018 advantage wire were used to try to cross the occlusion but this was clear that this was not going to be crossed.  The patient had two-vessel runoff distally and his perfusion should certainly be adequate for wound healing.  The biggest issue now is likely his poor cardiac function and markedly reduced ejection fraction. I elected to terminate the procedure. The sheath was removed and StarClose closure device was deployed in the left femoral artery with excellent hemostatic result. The patient was taken to the recovery room in stable condition having tolerated the procedure well.  Findings:               Aortogram:  Extremely slow circulation time but the renal arteries appeared patent, the aorta and iliac arteries were patent without significant stenosis including the previously placed stents in the iliac arteries.             Right Lower Extremity:  This demonstrated no significant stenosis in the SFA or popliteal arteries.  The anterior tibial artery was occluded but the posterior tibial and peroneal arteries were continuous distally.  Those 2 vessels did not have any clear narrowing, but the  tibioperoneal trunk did have a moderate stenosis in the 55 to 60% range   Disposition: Patient was taken to the recovery room in stable condition having tolerated the procedure well.  Complications: None  Leotis Pain 09/30/2018 9:44 AM   This note was created with Dragon Medical transcription system. Any errors in dictation are purely unintentional.

## 2018-10-06 ENCOUNTER — Other Ambulatory Visit (INDEPENDENT_AMBULATORY_CARE_PROVIDER_SITE_OTHER): Payer: Self-pay | Admitting: Vascular Surgery

## 2018-10-06 DIAGNOSIS — Z9862 Peripheral vascular angioplasty status: Secondary | ICD-10-CM

## 2018-10-06 MED ORDER — CLINDAMYCIN PHOSPHATE 300 MG/50ML IV SOLN: 300.0000 mg | Freq: Once | INTRAVENOUS | Status: DC

## 2018-10-07 ENCOUNTER — Encounter
Admission: RE | Disposition: A | Discharge status: Home / Self Care | Payer: Self-pay | Source: Home / Self Care | Attending: Vascular Surgery

## 2018-10-07 ENCOUNTER — Other Ambulatory Visit: Payer: Self-pay

## 2018-10-07 ENCOUNTER — Ambulatory Visit
Admission: RE | Admit: 2018-10-07 | Discharge: 2018-10-07 | Disposition: A | Discharge status: Home / Self Care | Payer: Medicare Other | Attending: Vascular Surgery | Admitting: Vascular Surgery

## 2018-10-07 ENCOUNTER — Encounter: Payer: Self-pay | Admitting: Vascular Surgery

## 2018-10-07 DIAGNOSIS — Z992 Dependence on renal dialysis: Secondary | ICD-10-CM | POA: Diagnosis not present

## 2018-10-07 DIAGNOSIS — F17210 Nicotine dependence, cigarettes, uncomplicated: Secondary | ICD-10-CM | POA: Insufficient documentation

## 2018-10-07 DIAGNOSIS — E785 Hyperlipidemia, unspecified: Secondary | ICD-10-CM | POA: Diagnosis not present

## 2018-10-07 DIAGNOSIS — H409 Unspecified glaucoma: Secondary | ICD-10-CM | POA: Diagnosis not present

## 2018-10-07 DIAGNOSIS — Z79899 Other long term (current) drug therapy: Secondary | ICD-10-CM | POA: Insufficient documentation

## 2018-10-07 DIAGNOSIS — M109 Gout, unspecified: Secondary | ICD-10-CM | POA: Insufficient documentation

## 2018-10-07 DIAGNOSIS — N186 End stage renal disease: Secondary | ICD-10-CM | POA: Insufficient documentation

## 2018-10-07 DIAGNOSIS — Z8673 Personal history of transient ischemic attack (TIA), and cerebral infarction without residual deficits: Secondary | ICD-10-CM | POA: Insufficient documentation

## 2018-10-07 DIAGNOSIS — Z880 Allergy status to penicillin: Secondary | ICD-10-CM | POA: Insufficient documentation

## 2018-10-07 DIAGNOSIS — L97524 Non-pressure chronic ulcer of other part of left foot with necrosis of bone: Secondary | ICD-10-CM | POA: Insufficient documentation

## 2018-10-07 DIAGNOSIS — E1152 Type 2 diabetes mellitus with diabetic peripheral angiopathy with gangrene: Secondary | ICD-10-CM | POA: Insufficient documentation

## 2018-10-07 DIAGNOSIS — L97909 Non-pressure chronic ulcer of unspecified part of unspecified lower leg with unspecified severity: Principal | ICD-10-CM

## 2018-10-07 DIAGNOSIS — Z7902 Long term (current) use of antithrombotics/antiplatelets: Secondary | ICD-10-CM | POA: Insufficient documentation

## 2018-10-07 DIAGNOSIS — Z7984 Long term (current) use of oral hypoglycemic drugs: Secondary | ICD-10-CM | POA: Insufficient documentation

## 2018-10-07 DIAGNOSIS — E11621 Type 2 diabetes mellitus with foot ulcer: Secondary | ICD-10-CM | POA: Insufficient documentation

## 2018-10-07 DIAGNOSIS — L97514 Non-pressure chronic ulcer of other part of right foot with necrosis of bone: Secondary | ICD-10-CM | POA: Diagnosis not present

## 2018-10-07 DIAGNOSIS — I70299 Other atherosclerosis of native arteries of extremities, unspecified extremity: Secondary | ICD-10-CM

## 2018-10-07 DIAGNOSIS — I120 Hypertensive chronic kidney disease with stage 5 chronic kidney disease or end stage renal disease: Secondary | ICD-10-CM | POA: Diagnosis not present

## 2018-10-07 DIAGNOSIS — Z7982 Long term (current) use of aspirin: Secondary | ICD-10-CM | POA: Insufficient documentation

## 2018-10-07 DIAGNOSIS — I70263 Atherosclerosis of native arteries of extremities with gangrene, bilateral legs: Secondary | ICD-10-CM | POA: Diagnosis not present

## 2018-10-07 DIAGNOSIS — L97519 Non-pressure chronic ulcer of other part of right foot with unspecified severity: Secondary | ICD-10-CM | POA: Diagnosis not present

## 2018-10-07 DIAGNOSIS — E1122 Type 2 diabetes mellitus with diabetic chronic kidney disease: Secondary | ICD-10-CM | POA: Insufficient documentation

## 2018-10-07 DIAGNOSIS — Z89421 Acquired absence of other right toe(s): Secondary | ICD-10-CM | POA: Diagnosis not present

## 2018-10-07 DIAGNOSIS — L97529 Non-pressure chronic ulcer of other part of left foot with unspecified severity: Secondary | ICD-10-CM | POA: Diagnosis not present

## 2018-10-07 HISTORY — PX: LOWER EXTREMITY ANGIOGRAPHY: CATH118251

## 2018-10-07 LAB — GLUCOSE, CAPILLARY
Glucose-Capillary: 170 mg/dL — ABNORMAL HIGH (ref 70–99)
Glucose-Capillary: 76 mg/dL (ref 70–99)
Glucose-Capillary: 74 mg/dL (ref 70–99)

## 2018-10-07 LAB — POTASSIUM (ARMC VASCULAR LAB ONLY): Potassium (ARMC vascular lab): 3 — ABNORMAL LOW (ref 3.5–5.1)

## 2018-10-07 SURGERY — LOWER EXTREMITY ANGIOGRAPHY
Anesthesia: Moderate Sedation | Site: Leg Lower | Laterality: Left

## 2018-10-07 MED ORDER — LABETALOL HCL 5 MG/ML IV SOLN
INTRAVENOUS | Status: AC
  Filled 2018-10-07: qty 4

## 2018-10-07 MED ORDER — SODIUM CHLORIDE 0.9% FLUSH: 3.0000 mL | Freq: Two times a day (BID) | INTRAVENOUS | Status: DC

## 2018-10-07 MED ORDER — MIDAZOLAM HCL 2 MG/2ML IJ SOLN
INTRAMUSCULAR | Status: AC
  Filled 2018-10-07: qty 4

## 2018-10-07 MED ORDER — METHYLPREDNISOLONE SODIUM SUCC 125 MG IJ SOLR: 125.0000 mg | Freq: Once | INTRAMUSCULAR | Status: DC | PRN

## 2018-10-07 MED ORDER — SODIUM CHLORIDE 0.9 % IV SOLN: 250.0000 mL | INTRAVENOUS | Status: DC | PRN

## 2018-10-07 MED ORDER — IOHEXOL 300 MG/ML  SOLN
INTRAMUSCULAR | Status: DC | PRN
  Administered 2018-10-07: 100 mL via INTRA_ARTERIAL

## 2018-10-07 MED ORDER — MIDAZOLAM HCL 2 MG/ML PO SYRP: 8.0000 mg | ORAL_SOLUTION | Freq: Once | ORAL | Status: DC | PRN

## 2018-10-07 MED ORDER — FENTANYL CITRATE (PF) 100 MCG/2ML IJ SOLN
INTRAMUSCULAR | Status: DC | PRN
  Administered 2018-10-07 (×2): 50 ug via INTRAVENOUS

## 2018-10-07 MED ORDER — HEPARIN SODIUM (PORCINE) 1000 UNIT/ML IJ SOLN
INTRAMUSCULAR | Status: DC | PRN
  Administered 2018-10-07: 4000 [IU] via INTRAVENOUS

## 2018-10-07 MED ORDER — DIPHENHYDRAMINE HCL 50 MG/ML IJ SOLN: 50.0000 mg | Freq: Once | INTRAMUSCULAR | Status: DC | PRN

## 2018-10-07 MED ORDER — LIDOCAINE-EPINEPHRINE (PF) 1 %-1:200000 IJ SOLN
INTRAMUSCULAR | Status: AC
  Filled 2018-10-07: qty 30

## 2018-10-07 MED ORDER — HEPARIN SODIUM (PORCINE) 1000 UNIT/ML IJ SOLN
INTRAMUSCULAR | Status: AC
  Filled 2018-10-07: qty 1

## 2018-10-07 MED ORDER — CLINDAMYCIN PHOSPHATE 300 MG/50ML IV SOLN
INTRAVENOUS | Status: AC
  Administered 2018-10-07: 300 mg
  Filled 2018-10-07: qty 50

## 2018-10-07 MED ORDER — ACETAMINOPHEN 325 MG PO TABS: 650.0000 mg | ORAL_TABLET | ORAL | Status: DC | PRN

## 2018-10-07 MED ORDER — LABETALOL HCL 5 MG/ML IV SOLN
INTRAVENOUS | Status: DC | PRN
  Administered 2018-10-07: 10 mg via INTRAVENOUS

## 2018-10-07 MED ORDER — SODIUM CHLORIDE 0.9 % IV SOLN: INTRAVENOUS | Status: DC

## 2018-10-07 MED ORDER — LABETALOL HCL 5 MG/ML IV SOLN: 10.0000 mg | INTRAVENOUS | Status: DC | PRN

## 2018-10-07 MED ORDER — ONDANSETRON HCL 4 MG/2ML IJ SOLN: 4.0000 mg | Freq: Four times a day (QID) | INTRAMUSCULAR | Status: DC | PRN

## 2018-10-07 MED ORDER — HYDRALAZINE HCL 20 MG/ML IJ SOLN: 5.0000 mg | INTRAMUSCULAR | Status: DC | PRN

## 2018-10-07 MED ORDER — MIDAZOLAM HCL 2 MG/2ML IJ SOLN
INTRAMUSCULAR | Status: DC | PRN
  Administered 2018-10-07 (×2): 2 mg via INTRAVENOUS

## 2018-10-07 MED ORDER — SODIUM CHLORIDE 0.9% FLUSH: 3.0000 mL | INTRAVENOUS | Status: DC | PRN

## 2018-10-07 MED ORDER — FAMOTIDINE 20 MG PO TABS: 40.0000 mg | ORAL_TABLET | Freq: Once | ORAL | Status: DC | PRN

## 2018-10-07 MED ORDER — FENTANYL CITRATE (PF) 100 MCG/2ML IJ SOLN
INTRAMUSCULAR | Status: AC
  Filled 2018-10-07: qty 2

## 2018-10-07 MED ORDER — SODIUM CHLORIDE 0.9 % IV SOLN
INTRAVENOUS | Status: DC
  Administered 2018-10-07: 09:00:00 via INTRAVENOUS

## 2018-10-07 MED ORDER — HYDROMORPHONE HCL 1 MG/ML IJ SOLN: 1.0000 mg | Freq: Once | INTRAMUSCULAR | Status: DC | PRN

## 2018-10-07 SURGICAL SUPPLY — 19 items
BALLN LUTONIX 018 6X100X130 (BALLOONS) ×3
BALLN ULTRVRSE 2.5X300X150 (BALLOONS) ×3
BALLN ULTRVRSE 3X220X150 (BALLOONS) ×3
BALLOON LUTONIX 018 6X100X130 (BALLOONS) ×1 IMPLANT
BALLOON ULTRVRSE 2.5X300X150 (BALLOONS) ×1 IMPLANT
BALLOON ULTRVRSE 3X220X150 (BALLOONS) ×1 IMPLANT
CATH CXI SUPP ANG 4FR 135 (CATHETERS) ×1 IMPLANT
CATH CXI SUPP ANG 4FR 135CM (CATHETERS) ×3
CATH PIG 70CM (CATHETERS) ×3 IMPLANT
DEVICE PRESTO INFLATION (MISCELLANEOUS) ×3 IMPLANT
DEVICE SAFEGUARD 24CM (GAUZE/BANDAGES/DRESSINGS) ×3 IMPLANT
DEVICE STARCLOSE SE CLOSURE (Vascular Products) ×3 IMPLANT
GLIDEWIRE ADV .035X260CM (WIRE) ×3 IMPLANT
PACK ANGIOGRAPHY (CUSTOM PROCEDURE TRAY) ×3 IMPLANT
SHEATH BRITE TIP 5FRX11 (SHEATH) ×3 IMPLANT
SHEATH RAABE 6FRX70 (SHEATH) ×3 IMPLANT
TUBING CONTRAST HIGH PRESS 72 (TUBING) ×3 IMPLANT
WIRE G V18X300CM (WIRE) ×6 IMPLANT
WIRE J 3MM .035X145CM (WIRE) ×3 IMPLANT

## 2018-10-07 NOTE — Progress Notes (Signed)
Patient blood glucose level at 76 upon return gave 4 ounces of orange juice. Recheck blood glucose 72 gave sandwich meal and gingerale.

## 2018-10-07 NOTE — Discharge Instructions (Signed)
Femoral Site Care °This sheet gives you information about how to care for yourself after your procedure. Your health care provider may also give you more specific instructions. If you have problems or questions, contact your health care provider. °What can I expect after the procedure? °After the procedure, it is common to have: °· Bruising that usually fades within 1-2 weeks. °· Tenderness at the site. °Follow these instructions at home: °Wound care °· Follow instructions from your health care provider about how to take care of your insertion site. Make sure you: °? Wash your hands with soap and water before you change your bandage (dressing). If soap and water are not available, use hand sanitizer. °? Change your dressing as told by your health care provider. °? Leave stitches (sutures), skin glue, or adhesive strips in place. These skin closures may need to stay in place for 2 weeks or longer. If adhesive strip edges start to loosen and curl up, you may trim the loose edges. Do not remove adhesive strips completely unless your health care provider tells you to do that. °· Do not take baths, swim, or use a hot tub until your health care provider approves. °· You may shower 24-48 hours after the procedure or as told by your health care provider. °? Gently wash the site with plain soap and water. °? Pat the area dry with a clean towel. °? Do not rub the site. This may cause bleeding. °· Do not apply powder or lotion to the site. Keep the site clean and dry. °· Check your femoral site every day for signs of infection. Check for: °? Redness, swelling, or pain. °? Fluid or blood. °? Warmth. °? Pus or a bad smell. °Activity °· For the first 2-3 days after your procedure, or as long as directed: °? Avoid climbing stairs as much as possible. °? Do not squat. °· Do not lift anything that is heavier than 10 lb (4.5 kg), or the limit that you are told, until your health care provider says that it is safe. °· Rest as  directed. °? Avoid sitting for a long time without moving. Get up to take short walks every 1-2 hours. °· Do not drive for 24 hours if you were given a medicine to help you relax (sedative). °General instructions °· Take over-the-counter and prescription medicines only as told by your health care provider. °· Keep all follow-up visits as told by your health care provider. This is important. °Contact a health care provider if you have: °· A fever or chills. °· You have redness, swelling, or pain around your insertion site. °Get help right away if: °· The catheter insertion area swells very fast. °· You pass out. °· You suddenly start to sweat or your skin gets clammy. °· The catheter insertion area is bleeding, and the bleeding does not stop when you hold steady pressure on the area. °· The area near or just beyond the catheter insertion site becomes pale, cool, tingly, or numb. °These symptoms may represent a serious problem that is an emergency. Do not wait to see if the symptoms will go away. Get medical help right away. Call your local emergency services (911 in the [place] Do not drive yourself to the hospital. °Summary °· After the procedure, it is common to have bruising that usually fades within 1-2 weeks. °· Check your femoral site every day for signs of infection. °· Do not lift anything that is heavier than 10 lb (4.5 kg), or the   limit that you are told, until your health care provider says that it is safe. This information is not intended to replace advice given to you by your health care provider. Make sure you discuss any questions you have with your health care provider. Document Released: 01/20/2014 Document Revised: 06/01/2017 Document Reviewed: 06/01/2017 Elsevier Interactive Patient Education  2019 [employer]. Moderate Conscious Sedation, Adult, Care After These instructions provide you with information about caring for yourself after your procedure. Your health care provider may also give  you more specific instructions. Your treatment has been planned according to current medical practices, but problems sometimes occur. Call your health care provider if you have any problems or questions after your procedure. What can I expect after the procedure? After your procedure, it is common:  To feel sleepy for several hours.  To feel clumsy and have poor balance for several hours.  To have poor judgment for several hours.  To vomit if you eat too soon. Follow these instructions at home: For at least 24 hours after the procedure:   Do not: ? Participate in activities where you could fall or become injured. ? Drive. ? Use heavy machinery. ? Drink alcohol. ? Take sleeping pills or medicines that cause drowsiness. ? Make important decisions or sign legal documents. ? Take care of children on your own.  Rest. Eating and drinking  Follow the diet recommended by your health care provider.  If you vomit: ? Drink water, juice, or soup when you can drink without vomiting. ? Make sure you have little or no nausea before eating solid foods. General instructions  Have a responsible adult stay with you until you are awake and alert.  Take over-the-counter and prescription medicines only as told by your health care provider.  If you smoke, do not smoke without supervision.  Keep all follow-up visits as told by your health care provider. This is important. Contact a health care provider if:  You keep feeling nauseous or you keep vomiting.  You feel light-headed.  You develop a rash.  You have a fever. Get help right away if:  You have trouble breathing. This information is not intended to replace advice given to you by your health care provider. Make sure you discuss any questions you have with your health care provider. Document Released: 03/09/2013 Document Revised: 10/22/2015 Document Reviewed: 09/08/2015 Elsevier Interactive Patient Education  2019 [place] After This sheet gives you information about how to care for yourself after your procedure. Your doctor may also give you more specific instructions. If you have problems or questions, contact your doctor. Follow these instructions at home: Insertion site care  Follow instructions from your doctor about how to take care of your long, thin tube (catheter) insertion area. Make sure you: ? Wash your hands with soap and water before you change your bandage (dressing). If you cannot use soap and water, use hand sanitizer. ? Change your bandage as told by your doctor. ? Leave stitches (sutures), skin glue, or skin tape (adhesive) strips in place. They may need to stay in place for 2 weeks or longer. If tape strips get loose and curl up, you may trim the loose edges. Do not remove tape strips completely unless your doctor says it is okay.  Do not take baths, swim, or use a hot tub until your doctor says it is okay.  You may shower 24-48 hours after the procedure or as told by your doctor. ? Gently wash  the area with plain soap and water. ? Pat the area dry with a clean towel. ? Do not rub the area. This may cause bleeding.  Do not apply powder or lotion to the area. Keep the area clean and dry.  Check your insertion area every day for signs of infection. Check for: ? More redness, swelling, or pain. ? Fluid or blood. ? Warmth. ? Pus or a bad smell. Activity  Rest as told by your doctor, usually for 1-2 days.  Do not lift anything that is heavier than 10 lbs. (4.5 kg) or as told by your doctor.  Do not drive for 24 hours if you were given a medicine to help you relax (sedative).  Do not drive or use heavy machinery while taking prescription pain medicine. General instructions   Go back to your normal activities as told by your doctor, usually in about a week. Ask your doctor what activities are safe for you.  If the insertion area starts to bleed, lie flat and put  pressure on the area. If the bleeding does not stop, get help right away. This is an emergency.  Drink enough fluid to keep your pee (urine) clear or pale yellow.  Take over-the-counter and prescription medicines only as told by your doctor.  Keep all follow-up visits as told by your doctor. This is important. Contact a doctor if:  You have a fever.  You have chills.  You have more redness, swelling, or pain around your insertion area.  You have fluid or blood coming from your insertion area.  The insertion area feels warm to the touch.  You have pus or a bad smell coming from your insertion area.  You have more bruising around the insertion area.  Blood collects in the tissue around the insertion area (hematoma) that may be painful to the touch. Get help right away if:  You have a lot of pain in the insertion area.  The insertion area swells very fast.  The insertion area is bleeding, and the bleeding does not stop after holding steady pressure on the area.  The area near or just beyond the insertion area becomes pale, cool, tingly, or numb. These symptoms may be an emergency. Do not wait to see if the symptoms will go away. Get medical help right away. Call your local emergency services (911 in the [place] Do not drive yourself to the hospital. Summary  After the procedure, it is common to have bruising and tenderness at the long, thin tube insertion area.  After the procedure, it is important to rest and drink plenty of fluids.  Do not take baths, swim, or use a hot tub until your doctor says it is okay to do so. You may shower 24-48 hours after the procedure or as told by your doctor.  If the insertion area starts to bleed, lie flat and put pressure on the area. If the bleeding does not stop, get help right away. This is an emergency. This information is not intended to replace advice given to you by your health care provider. Make sure you discuss any questions you have  with your health care provider. Document Released: 08/15/2008 Document Revised: 05/13/2016 Document Reviewed: 05/13/2016 Elsevier Interactive Patient Education  2019 [employer].

## 2018-10-07 NOTE — Op Note (Signed)
[facility]  Percutaneous Study/Intervention Procedural Note   Date of Surgery: 10/07/2018  Surgeon(s):Ndrew Creason    Assistants:none  Pre-operative Diagnosis: PAD with ulceration and gangrenous changes to bilateral lower extremities  Post-operative diagnosis:  Same  Procedure(s) Performed:             1.  Ultrasound guidance for vascular access right femoral artery             2.  Catheter placement into left SFA from right femoral approach             3.  Aortogram and selective left lower extremity angiogram             4.  Percutaneous transluminal angioplasty of left anterior tibial artery with 2.5 mm diameter and 3 mm diameter angioplasty balloons             5.   Percutaneous transluminal angioplasty of the left distal SFA and proximal popliteal artery with a 6 mm diameter Lutonix drug-coated angioplasty balloon  6.   StarClose closure device right femoral artery  EBL: 10 cc  Contrast: 100 cc  Fluoro Time: 12.5 minutes  Moderate Conscious Sedation Time: approximately 45 minutes using 4 mg of Versed and 50 Mcg of Fentanyl              Indications:  Patient is a 73 y.o.male with gangrenous changes to both feet and known history of severe PAD with multiple previous interventions. The patient is brought in for angiography for further evaluation and potential treatment.  Due to the limb threatening nature of the situation, angiogram was performed for attempted limb salvage. The patient is aware that if the procedure fails, amputation would be expected.  The patient also understands that even with successful revascularization, amputation may still be required due to the severity of the situation.  Risks and benefits are discussed and informed consent is obtained.   Procedure:  The patient was identified and appropriate procedural time out was performed.  The patient was then placed supine on the table and prepped and draped in the usual sterile fashion. Moderate  conscious sedation was administered during a face to face encounter with the patient throughout the procedure with my supervision of the RN administering medicines and monitoring the patient's vital signs, pulse oximetry, telemetry and mental status throughout from the start of the procedure until the patient was taken to the recovery room. Ultrasound was used to evaluate the right common femoral artery.  It was patent .  A digital ultrasound image was acquired.  A Seldinger needle was used to access the right common femoral artery under direct ultrasound guidance and a permanent image was performed.  A 0.035 J wire was advanced without resistance and a 5Fr sheath was placed.  Pigtail catheter was placed into the aorta and an AP aortogram was performed. This demonstrated extremely slow circulation time but the renal arteries appeared patent, the aorta and iliac arteries were patent without significant stenosis including the previously placed stents in the iliac arteries. I then crossed the aortic bifurcation and advanced to the left femoral head. I had to advance into the proximal to mid SFA to opacify distally. Selective left lower extremity angiogram was then performed. This demonstrated mildly irregular common femoral artery, profunda femoris artery, and proximal to mid superficial femoral arteries without hemodynamically significant stenosis.  There was a moderate stenosis in the 50 to 60% range at Hunter's canal in the distal SFA and proximal popliteal  artery.  The vessel then normalized and the posterior tibial artery was a large dominant vessel with runoff to the foot.  The peroneal artery was small but did provide additional continuous flow distally.  The anterior tibial artery was occluded in the proximal segment but did reconstitute in the foot.  Visualization was somewhat difficult due to the very slow circulation time. It was felt that it was in the patient's best interest to proceed with intervention  after these images to avoid a second procedure and a larger amount of contrast and fluoroscopy based off of the findings from the initial angiogram. The patient was systemically heparinized and a 6 French 70 cm sheath was then placed over the Terumo Advantage wire. I then used a Kumpe catheter and the advantage wire to navigate through the moderate SFA and popliteal lesions and get down into the anterior tibial artery.  Selective imaging did demonstrate the long segment occlusion with distal reconstitution.  Tediously, I used a V 18 wire and a CXI catheter to cross the lesion and confirm intraluminal flow in the foot.  I then replaced the wire and proceeded with treatment.  A 2.5 mm diameter by 30 cm length angioplasty balloon was inflated twice from the foot up to the proximal anterior tibial artery.  Both inflations were 10 to 12 atm for 1 minute.  I then treated the moderate lesion at Hunter's canal with a 6 mm diameter by 8 cm length Lutonix drug-coated angioplasty balloon inflated to 12 atm for 1 minute.  At [facility] canal, about a 30% residual stenosis was seen in the distal SFA and above-knee popliteal artery.  The anterior tibial artery still did not have inline flow to the foot with injections through the sheath with all of the flow going out the posterior tibial and peroneal arteries.  I then upsized to a 3 mm diameter by 22 cm length angioplasty balloon to treat the mid to distal segments of the anterior tibial artery and inflated this to 12 atm for 1.  Even with a CXI catheter in the anterior tibial artery flow was not seen in the foot which may be a combination of spasm and severe disease with 2 other vessels getting distally.  At this point, he still has two-vessel runoff distally and there is not much more we could do today. I elected to terminate the procedure. The sheath was removed and StarClose closure device was deployed in the right femoral artery with excellent hemostatic result. The patient  was taken to the recovery room in stable condition having tolerated the procedure well.  Findings:               Aortogram:  Extremely slow circulation time but the renal arteries appeared patent, the aorta and iliac arteries were patent without significant stenosis including the previously placed stents in the iliac arteries.             Left Lower Extremity:  This demonstrated mildly irregular common femoral artery, profunda femoris artery, and proximal to mid superficial femoral arteries without hemodynamically significant stenosis.  There was a moderate stenosis in the 50 to 60% range at Hunter's canal in the distal SFA and proximal popliteal artery.  The vessel then normalized and the posterior tibial artery was a large dominant vessel with runoff to the foot.  The peroneal artery was small but did provide additional continuous flow distally.  The anterior tibial artery was occluded in the proximal segment but did reconstitute in the foot.  Visualization was somewhat difficult due to the very slow circulation time.   Disposition: Patient was taken to the recovery room in stable condition having tolerated the procedure well.  Complications: None  Leotis Pain 10/07/2018 10:11 AM   This note was created with Dragon Medical transcription system. Any errors in dictation are purely unintentional.

## 2018-10-07 NOTE — H&P (Signed)
[facility] History & Physical Update  The patient was interviewed and re-examined.  The patient's previous History and Physical has been reviewed and is unchanged.  There is no change in the plan of care. We plan to proceed with the scheduled procedure.  Leotis Pain, MD  10/07/2018, 8:46 AM

## 2018-10-08 ENCOUNTER — Encounter (INDEPENDENT_AMBULATORY_CARE_PROVIDER_SITE_OTHER): Payer: Medicare Other

## 2018-10-08 ENCOUNTER — Ambulatory Visit (INDEPENDENT_AMBULATORY_CARE_PROVIDER_SITE_OTHER): Payer: Medicare Other | Admitting: Nurse Practitioner

## 2018-10-11 ENCOUNTER — Emergency Department: Payer: Medicare Other

## 2018-10-11 ENCOUNTER — Telehealth (INDEPENDENT_AMBULATORY_CARE_PROVIDER_SITE_OTHER): Payer: Self-pay

## 2018-10-11 ENCOUNTER — Other Ambulatory Visit: Payer: Self-pay

## 2018-10-11 ENCOUNTER — Encounter: Payer: Self-pay | Admitting: Emergency Medicine

## 2018-10-11 ENCOUNTER — Inpatient Hospital Stay
Admission: EM | Admit: 2018-10-11 | Discharge: 2018-10-16 | DRG: 255 | Disposition: A | Discharge status: Skilled Nursing Facility | Payer: Medicare Other | Attending: Specialist | Admitting: Specialist

## 2018-10-11 DIAGNOSIS — E1152 Type 2 diabetes mellitus with diabetic peripheral angiopathy with gangrene: Secondary | ICD-10-CM | POA: Diagnosis present

## 2018-10-11 DIAGNOSIS — D631 Anemia in chronic kidney disease: Secondary | ICD-10-CM | POA: Diagnosis present

## 2018-10-11 DIAGNOSIS — Z7902 Long term (current) use of antithrombotics/antiplatelets: Secondary | ICD-10-CM

## 2018-10-11 DIAGNOSIS — Z20828 Contact with and (suspected) exposure to other viral communicable diseases: Secondary | ICD-10-CM | POA: Diagnosis present

## 2018-10-11 DIAGNOSIS — Z7982 Long term (current) use of aspirin: Secondary | ICD-10-CM

## 2018-10-11 DIAGNOSIS — M79672 Pain in left foot: Secondary | ICD-10-CM | POA: Diagnosis present

## 2018-10-11 DIAGNOSIS — L03115 Cellulitis of right lower limb: Secondary | ICD-10-CM | POA: Diagnosis present

## 2018-10-11 DIAGNOSIS — E1122 Type 2 diabetes mellitus with diabetic chronic kidney disease: Secondary | ICD-10-CM | POA: Diagnosis present

## 2018-10-11 DIAGNOSIS — I70263 Atherosclerosis of native arteries of extremities with gangrene, bilateral legs: Secondary | ICD-10-CM

## 2018-10-11 DIAGNOSIS — H409 Unspecified glaucoma: Secondary | ICD-10-CM | POA: Diagnosis present

## 2018-10-11 DIAGNOSIS — I998 Other disorder of circulatory system: Secondary | ICD-10-CM | POA: Diagnosis present

## 2018-10-11 DIAGNOSIS — Z8546 Personal history of malignant neoplasm of prostate: Secondary | ICD-10-CM

## 2018-10-11 DIAGNOSIS — F329 Major depressive disorder, single episode, unspecified: Secondary | ICD-10-CM | POA: Diagnosis present

## 2018-10-11 DIAGNOSIS — N186 End stage renal disease: Secondary | ICD-10-CM | POA: Diagnosis present

## 2018-10-11 DIAGNOSIS — Z880 Allergy status to penicillin: Secondary | ICD-10-CM

## 2018-10-11 DIAGNOSIS — Z79891 Long term (current) use of opiate analgesic: Secondary | ICD-10-CM

## 2018-10-11 DIAGNOSIS — M109 Gout, unspecified: Secondary | ICD-10-CM | POA: Diagnosis present

## 2018-10-11 DIAGNOSIS — B964 Proteus (mirabilis) (morganii) as the cause of diseases classified elsewhere: Secondary | ICD-10-CM | POA: Diagnosis present

## 2018-10-11 DIAGNOSIS — I120 Hypertensive chronic kidney disease with stage 5 chronic kidney disease or end stage renal disease: Secondary | ICD-10-CM | POA: Diagnosis present

## 2018-10-11 DIAGNOSIS — L03116 Cellulitis of left lower limb: Secondary | ICD-10-CM | POA: Diagnosis present

## 2018-10-11 DIAGNOSIS — G4700 Insomnia, unspecified: Secondary | ICD-10-CM | POA: Diagnosis present

## 2018-10-11 DIAGNOSIS — Z7984 Long term (current) use of oral hypoglycemic drugs: Secondary | ICD-10-CM

## 2018-10-11 DIAGNOSIS — Z89421 Acquired absence of other right toe(s): Secondary | ICD-10-CM

## 2018-10-11 DIAGNOSIS — R011 Cardiac murmur, unspecified: Secondary | ICD-10-CM | POA: Diagnosis present

## 2018-10-11 DIAGNOSIS — F17210 Nicotine dependence, cigarettes, uncomplicated: Secondary | ICD-10-CM | POA: Diagnosis present

## 2018-10-11 DIAGNOSIS — Z9861 Coronary angioplasty status: Secondary | ICD-10-CM | POA: Diagnosis not present

## 2018-10-11 DIAGNOSIS — Z8673 Personal history of transient ischemic attack (TIA), and cerebral infarction without residual deficits: Secondary | ICD-10-CM

## 2018-10-11 DIAGNOSIS — Z992 Dependence on renal dialysis: Secondary | ICD-10-CM | POA: Diagnosis not present

## 2018-10-11 DIAGNOSIS — Z9049 Acquired absence of other specified parts of digestive tract: Secondary | ICD-10-CM | POA: Diagnosis not present

## 2018-10-11 DIAGNOSIS — Z79899 Other long term (current) drug therapy: Secondary | ICD-10-CM | POA: Diagnosis not present

## 2018-10-11 DIAGNOSIS — I96 Gangrene, not elsewhere classified: Secondary | ICD-10-CM | POA: Diagnosis present

## 2018-10-11 DIAGNOSIS — B952 Enterococcus as the cause of diseases classified elsewhere: Secondary | ICD-10-CM | POA: Diagnosis present

## 2018-10-11 DIAGNOSIS — E785 Hyperlipidemia, unspecified: Secondary | ICD-10-CM | POA: Diagnosis present

## 2018-10-11 LAB — CBC WITH DIFFERENTIAL/PLATELET
Abs Immature Granulocytes: 0.04 10*3/uL (ref 0.00–0.07)
Basophils Absolute: 0.1 10*3/uL (ref 0.0–0.1)
Basophils Relative: 1 %
Eosinophils Absolute: 0.3 10*3/uL (ref 0.0–0.5)
Eosinophils Relative: 5 %
HCT: 30.7 % — ABNORMAL LOW (ref 39.0–52.0)
Hemoglobin: 10 g/dL — ABNORMAL LOW (ref 13.0–17.0)
Immature Granulocytes: 1 %
Lymphocytes Relative: 22 %
Lymphs Abs: 1.6 10*3/uL (ref 0.7–4.0)
MCH: 31.3 pg (ref 26.0–34.0)
MCHC: 32.6 g/dL (ref 30.0–36.0)
MCV: 95.9 fL (ref 80.0–100.0)
Monocytes Absolute: 0.8 10*3/uL (ref 0.1–1.0)
Monocytes Relative: 11 %
Neutro Abs: 4.4 10*3/uL (ref 1.7–7.7)
Neutrophils Relative %: 60 %
Platelets: 159 10*3/uL (ref 150–400)
RBC: 3.2 MIL/uL — ABNORMAL LOW (ref 4.22–5.81)
RDW: 18.9 % — ABNORMAL HIGH (ref 11.5–15.5)
WBC: 7.3 10*3/uL (ref 4.0–10.5)
nRBC: 0 % (ref 0.0–0.2)

## 2018-10-11 LAB — COMPREHENSIVE METABOLIC PANEL
ALT: 19 U/L (ref 0–44)
AST: 26 U/L (ref 15–41)
Albumin: 3.1 g/dL — ABNORMAL LOW (ref 3.5–5.0)
Alkaline Phosphatase: 46 U/L (ref 38–126)
Anion gap: 10 (ref 5–15)
BUN: 54 mg/dL — ABNORMAL HIGH (ref 8–23)
CO2: 25 mmol/L (ref 22–32)
Calcium: 8.7 mg/dL — ABNORMAL LOW (ref 8.9–10.3)
Chloride: 107 mmol/L (ref 98–111)
Creatinine, Ser: 5.97 mg/dL — ABNORMAL HIGH (ref 0.61–1.24)
GFR calc Af Amer: 10 mL/min — ABNORMAL LOW (ref >60)
GFR calc non Af Amer: 9 mL/min — ABNORMAL LOW (ref >60)
Glucose, Bld: 97 mg/dL (ref 70–99)
Potassium: 3.3 mmol/L — ABNORMAL LOW (ref 3.5–5.1)
Sodium: 142 mmol/L (ref 135–145)
Total Bilirubin: 0.5 mg/dL (ref 0.3–1.2)
Total Protein: 7.2 g/dL (ref 6.5–8.1)

## 2018-10-11 LAB — SARS CORONAVIRUS 2 BY RT PCR (HOSPITAL ORDER, PERFORMED IN ~~LOC~~ HOSPITAL LAB): SARS Coronavirus 2: NEGATIVE

## 2018-10-11 LAB — LACTIC ACID, PLASMA: Lactic Acid, Venous: 1.1 mmol/L (ref 0.5–1.9)

## 2018-10-11 MED ORDER — AMLODIPINE BESYLATE 10 MG PO TABS
10.0000 mg | ORAL_TABLET | Freq: Every day | ORAL | Status: DC
  Administered 2018-10-13 – 2018-10-16 (×4): 10 mg via ORAL
  Filled 2018-10-11 (×4): qty 1

## 2018-10-11 MED ORDER — DOCUSATE SODIUM 100 MG PO CAPS
100.0000 mg | ORAL_CAPSULE | Freq: Two times a day (BID) | ORAL | Status: DC | PRN
  Administered 2018-10-15 – 2018-10-16 (×2): 100 mg via ORAL
  Filled 2018-10-11 (×2): qty 1

## 2018-10-11 MED ORDER — VANCOMYCIN HCL IN DEXTROSE 750-5 MG/150ML-% IV SOLN
750.0000 mg | INTRAVENOUS | Status: DC | PRN
  Filled 2018-10-11: qty 150

## 2018-10-11 MED ORDER — ATORVASTATIN CALCIUM 10 MG PO TABS
10.0000 mg | ORAL_TABLET | Freq: Every day | ORAL | Status: DC
  Administered 2018-10-12 – 2018-10-15 (×4): 10 mg via ORAL
  Filled 2018-10-11 (×4): qty 1

## 2018-10-11 MED ORDER — NICOTINE 21 MG/24HR TD PT24
21.0000 mg | MEDICATED_PATCH | Freq: Every day | TRANSDERMAL | Status: DC
  Administered 2018-10-11 – 2018-10-16 (×6): 21 mg via TRANSDERMAL
  Filled 2018-10-11 (×6): qty 1

## 2018-10-11 MED ORDER — OXYCODONE-ACETAMINOPHEN 5-325 MG PO TABS
2.0000 | ORAL_TABLET | Freq: Once | ORAL | Status: AC
  Administered 2018-10-11: 2 via ORAL
  Filled 2018-10-11: qty 2

## 2018-10-11 MED ORDER — VANCOMYCIN HCL IN DEXTROSE 1-5 GM/200ML-% IV SOLN: 1000.0000 mg | Freq: Once | INTRAVENOUS | Status: DC

## 2018-10-11 MED ORDER — GABAPENTIN 100 MG PO CAPS
100.0000 mg | ORAL_CAPSULE | Freq: Three times a day (TID) | ORAL | Status: DC
  Administered 2018-10-11 – 2018-10-16 (×12): 100 mg via ORAL
  Filled 2018-10-11 (×12): qty 1

## 2018-10-11 MED ORDER — SODIUM CHLORIDE 0.9 % IV SOLN
1.0000 g | INTRAVENOUS | Status: DC
  Administered 2018-10-12 – 2018-10-15 (×5): 1 g via INTRAVENOUS
  Filled 2018-10-11 (×6): qty 1

## 2018-10-11 MED ORDER — CIPROFLOXACIN IN D5W 400 MG/200ML IV SOLN
400.0000 mg | Freq: Once | INTRAVENOUS | Status: AC
  Administered 2018-10-11: 400 mg via INTRAVENOUS
  Filled 2018-10-11: qty 200

## 2018-10-11 MED ORDER — VANCOMYCIN HCL 1.25 G IV SOLR
1250.0000 mg | Freq: Once | INTRAVENOUS | Status: AC
  Administered 2018-10-11: 1250 mg via INTRAVENOUS
  Filled 2018-10-11: qty 1250

## 2018-10-11 MED ORDER — CLOPIDOGREL BISULFATE 75 MG PO TABS
75.0000 mg | ORAL_TABLET | Freq: Every day | ORAL | Status: DC
  Administered 2018-10-12 – 2018-10-16 (×5): 75 mg via ORAL
  Filled 2018-10-11 (×5): qty 1

## 2018-10-11 MED ORDER — ALLOPURINOL 100 MG PO TABS
100.0000 mg | ORAL_TABLET | Freq: Every day | ORAL | Status: DC
  Administered 2018-10-12 – 2018-10-16 (×5): 100 mg via ORAL
  Filled 2018-10-11 (×5): qty 1

## 2018-10-11 MED ORDER — INSULIN ASPART 100 UNIT/ML ~~LOC~~ SOLN
0.0000 [IU] | Freq: Three times a day (TID) | SUBCUTANEOUS | Status: DC
  Administered 2018-10-14: 2 [IU] via SUBCUTANEOUS
  Administered 2018-10-15: 12:00:00 1 [IU] via SUBCUTANEOUS
  Filled 2018-10-11 (×2): qty 1

## 2018-10-11 MED ORDER — CLONIDINE HCL 0.1 MG PO TABS
0.2000 mg | ORAL_TABLET | Freq: Two times a day (BID) | ORAL | Status: DC
  Administered 2018-10-12 – 2018-10-16 (×8): 0.2 mg via ORAL
  Filled 2018-10-11 (×8): qty 2

## 2018-10-11 MED ORDER — HEPARIN SODIUM (PORCINE) 5000 UNIT/ML IJ SOLN
5000.0000 [IU] | Freq: Three times a day (TID) | INTRAMUSCULAR | Status: DC
  Administered 2018-10-11 – 2018-10-16 (×11): 5000 [IU] via SUBCUTANEOUS
  Filled 2018-10-11 (×13): qty 1

## 2018-10-11 MED ORDER — OXYCODONE-ACETAMINOPHEN 7.5-325 MG PO TABS
1.0000 | ORAL_TABLET | ORAL | Status: DC | PRN
  Administered 2018-10-11 – 2018-10-14 (×7): 1 via ORAL
  Filled 2018-10-11 (×7): qty 1

## 2018-10-11 MED ORDER — HYDRALAZINE HCL 25 MG PO TABS
25.0000 mg | ORAL_TABLET | Freq: Three times a day (TID) | ORAL | Status: DC
  Administered 2018-10-12 – 2018-10-15 (×7): 25 mg via ORAL
  Filled 2018-10-11 (×8): qty 1

## 2018-10-11 MED ORDER — RENA-VITE PO TABS
1.0000 | ORAL_TABLET | Freq: Every day | ORAL | Status: DC
  Administered 2018-10-11 – 2018-10-15 (×5): 1 via ORAL
  Filled 2018-10-11 (×5): qty 1

## 2018-10-11 MED ORDER — VANCOMYCIN HCL 10 G IV SOLR
1250.0000 mg | Freq: Once | INTRAVENOUS | Status: DC
  Filled 2018-10-11: qty 1250

## 2018-10-11 MED ORDER — ASPIRIN EC 81 MG PO TBEC
81.0000 mg | DELAYED_RELEASE_TABLET | Freq: Every day | ORAL | Status: DC
  Administered 2018-10-12 – 2018-10-16 (×5): 81 mg via ORAL
  Filled 2018-10-11 (×5): qty 1

## 2018-10-11 MED ORDER — ESCITALOPRAM OXALATE 10 MG PO TABS
10.0000 mg | ORAL_TABLET | Freq: Every day | ORAL | Status: DC
  Administered 2018-10-12 – 2018-10-16 (×5): 10 mg via ORAL
  Filled 2018-10-11 (×5): qty 1

## 2018-10-11 MED ORDER — CLINDAMYCIN PHOSPHATE 600 MG/50ML IV SOLN
600.0000 mg | Freq: Once | INTRAVENOUS | Status: AC
  Administered 2018-10-11: 19:00:00 600 mg via INTRAVENOUS
  Filled 2018-10-11: qty 50

## 2018-10-11 MED ORDER — INSULIN ASPART 100 UNIT/ML ~~LOC~~ SOLN: 0.0000 [IU] | Freq: Every day | SUBCUTANEOUS | Status: DC

## 2018-10-11 NOTE — ED Provider Notes (Signed)
[facility] Emergency Department Provider Note    First MD Initiated Contact with Patient 10/11/18 1618     (approximate)  I have reviewed the triage vital signs and the nursing notes.   HISTORY  Chief Complaint Foot Burn    HPI Jose [name] is a 73 y.o. male close past medical history presents the ER for evaluation of worsening leg pain as well as purulent drainage from left status post PCI.  Has significant PAD.  Not currently on any antibiotics.  Has been having chills at home.  States that he is having worsening burning pain particularly to the left foot.  No nausea or vomiting.    Past Medical History:  Diagnosis Date  . Chronic kidney disease   . Depression   . Diabetes mellitus without complication ([place])   . Glaucoma   . Glaucoma   . Gout   . Heart murmur   . HLD (hyperlipidemia)   . Hypertension   . Prostate cancer ([place])   . Renal failure    dialysis t/t/s  . Stroke ([place])    tia x 2   Family History  Problem Relation Age of Onset  . Varicose Veins Neg Hx    Past Surgical History:  Procedure Laterality Date  . AMPUTATION TOE Bilateral 07/23/2018   Procedure: TOE MPJ RIGHT 3RD AND LEFT 2ND;  Surgeon: Samara Deist, DPM;  Location: [facility] ORS;  Service: Podiatry;  Laterality: Bilateral;  . APPENDECTOMY    . AV FISTULA PLACEMENT Left 06/09/2018   Procedure: ARTERIOVENOUS (AV) FISTULA CREATION ( BRACHIAL CEPHALIC);  Surgeon: Algernon Huxley, MD;  Location: [facility] ORS;  Service: Vascular;  Laterality: Left;  . CHOLECYSTECTOMY    . DIALYSIS/PERMA CATHETER INSERTION N/A 03/24/2018   Procedure: DIALYSIS/PERMA CATHETER INSERTION;  Surgeon: Katha Cabal, MD;  Location: [place] [facility];  Service: Cardiovascular;  Laterality: N/A;  . DIALYSIS/PERMA CATHETER REMOVAL N/A 09/06/2018   Procedure: DIALYSIS/PERMA CATHETER REMOVAL;  Surgeon: Algernon Huxley, MD;  Location: [place] [facility];  Service: Cardiovascular;  Laterality: N/A;  . INSERTION  PROSTATE RADIATION SEED    . LOWER EXTREMITY ANGIOGRAPHY Right 06/16/2018   Procedure: LOWER EXTREMITY ANGIOGRAPHY;  Surgeon: Algernon Huxley, MD;  Location: [place] [facility];  Service: Cardiovascular;  Laterality: Right;  . LOWER EXTREMITY ANGIOGRAPHY Left 08/30/2018   Procedure: LOWER EXTREMITY ANGIOGRAPHY;  Surgeon: Algernon Huxley, MD;  Location: [place] [facility];  Service: Cardiovascular;  Laterality: Left;  . LOWER EXTREMITY ANGIOGRAPHY Right 09/30/2018   Procedure: LOWER EXTREMITY ANGIOGRAPHY;  Surgeon: Algernon Huxley, MD;  Location: [place] [facility];  Service: Cardiovascular;  Laterality: Right;  . LOWER EXTREMITY ANGIOGRAPHY Left 10/07/2018   Procedure: LOWER EXTREMITY ANGIOGRAPHY;  Surgeon: Algernon Huxley, MD;  Location: [place] [facility];  Service: Cardiovascular;  Laterality: Left;   Patient Active Problem List   Diagnosis Date Noted  . 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 Gangrene of toe of right foot ([place]) 06/13/2018  . Diabetes mellitus ([place]) 04/16/2018  . Hypertension 04/16/2018  . ESRD on dialysis ([place]) 04/16/2018  . Malnutrition of moderate degree 03/27/2018  . Renal failure 03/21/2018      Prior to Admission medications   Medication Sig Start Date End Date Taking? Authorizing Provider  allopurinol (ZYLOPRIM) 100 MG tablet Take 100 mg by mouth daily.    Yes [provider]  amLODipine (NORVASC) 10 MG tablet Take 10 mg  by mouth daily.    Yes [provider]  aspirin EC 81 MG EC tablet Take 1 tablet (81 mg total) by mouth daily. 06/18/18  Yes Dustin Flock, MD  atorvastatin (LIPITOR) 10 MG tablet Take 1 tablet (10 mg total) by mouth daily at 6 PM. 08/30/18  Yes Stegmayer, Joelene Millin A, PA-C  cloNIDine (CATAPRES) 0.2 MG tablet TAKE 1 TABLET BY MOUTH TWICE A DAY Patient taking differently: Take 0.2 mg by mouth 2 (two) times daily.  05/14/18  Yes Dew, Erskine Squibb, MD  clopidogrel (PLAVIX) 75 MG tablet Take 1  tablet (75 mg total) by mouth daily. 06/18/18  Yes Dustin Flock, MD  doxycycline (VIBRAMYCIN) 100 MG capsule Take 1 capsule (100 mg total) by mouth 2 (two) times daily. 09/27/18  Yes Kris Hartmann, NP  escitalopram (LEXAPRO) 10 MG tablet Take 10 mg by mouth daily.    Yes [provider]  gabapentin (NEURONTIN) 300 MG capsule Take 1 capsule (300 mg total) by mouth 3 (three) times daily. Patient taking differently: Take 100 mg by mouth 3 (three) times daily.  09/27/18 01/25/19 Yes Kris Hartmann, NP  glipiZIDE (GLUCOTROL) 5 MG tablet Take 5 mg by mouth daily before breakfast.    Yes [provider]  hydrALAZINE (APRESOLINE) 25 MG tablet Take 1 tablet (25 mg total) by mouth every 8 (eight) hours. 03/27/18  Yes Wieting, Richard, MD  oxyCODONE-acetaminophen (PERCOCET) 7.5-325 MG tablet Take 1 tablet by mouth every 4 (four) hours as needed for severe pain. 09/06/18  Yes Dew, Erskine Squibb, MD  multivitamin (RENA-VIT) TABS tablet Take 1 tablet by mouth at bedtime. Patient not taking: Reported on 10/11/2018 03/27/18   Loletha Grayer, MD  Nutritional Supplements (FEEDING SUPPLEMENT, NEPRO CARB STEADY,) LIQD Take 237 mLs by mouth 2 (two) times daily between meals. Patient not taking: Reported on 10/11/2018 03/27/18   Loletha Grayer, MD    Allergies Penicillin g    Social History Social History   Tobacco Use  . Smoking status: Current Some Day Smoker    Packs/day: 0.10    Types: Cigarettes  . Smokeless tobacco: Never Used  Substance Use Topics  . Alcohol use: Not Currently    Alcohol/week: 0.0 standard drinks    Frequency: Never  . Drug use: Never    Review of Systems Patient denies headaches, rhinorrhea, blurry vision, numbness, shortness of breath, chest pain, edema, cough, abdominal pain, nausea, vomiting, diarrhea, dysuria, fevers, rashes or hallucinations unless otherwise stated above in HPI. ____________________________________________   PHYSICAL EXAM:  VITAL SIGNS:  Vitals:   10/11/18 1830 10/11/18 1900  BP: 139/81 130/75  Pulse: 76 80  Resp: 16 18  Temp:  98 F (36.7 C)  SpO2: 95% 94%    Constitutional: Alert and oriented.  Eyes: Conjunctivae are normal.  Head: Atraumatic. Nose: No congestion/rhinnorhea. Mouth/Throat: Mucous membranes are moist.   Neck: No stridor. Painless ROM.  Cardiovascular: Normal rate, regular rhythm. Grossly normal heart sounds.  Good peripheral circulation. Respiratory: Normal respiratory effort.  No retractions. Lungs CTAB. Gastrointestinal: Soft and nontender. No distention. No abdominal bruits. No CVA tenderness. Genitourinary:  Musculoskeletal: Chronic gangrenous areas to the right distal first and second toe the right foot with evidence of cellulitic warm purulent wet gangrenous changes of the left foot with purulence draining from the base of the second toe status post amputation.  No crepitus.   Neurologic:  Normal speech and language. No gross focal neurologic deficits are appreciated. No facial droop Skin:  Skin is warm,  dry and intact. No rash noted. Psychiatric: Mood and affect are normal. Speech and behavior are normal.  ____________________________________________   LABS (all labs ordered are listed, but only abnormal results are displayed)  Results for orders placed or performed during the hospital encounter of 10/11/18 (from the past 24 hour(s))  CBC with Differential/Platelet     Status: Abnormal   Collection Time: 10/11/18  4:26 PM  Result Value Ref Range   WBC 7.3 4.0 - 10.5 K/uL   RBC 3.20 (L) 4.22 - 5.81 MIL/uL   Hemoglobin 10.0 (L) 13.0 - 17.0 g/dL   HCT 30.7 (L) 39.0 - 52.0 %   MCV 95.9 80.0 - 100.0 fL   MCH 31.3 26.0 - 34.0 pg   MCHC 32.6 30.0 - 36.0 g/dL   RDW 18.9 (H) 11.5 - 15.5 %   Platelets 159 150 - 400 K/uL   nRBC 0.0 0.0 - 0.2 %   Neutrophils Relative % 60 %   Neutro Abs 4.4 1.7 - 7.7 K/uL   Lymphocytes Relative 22 %   Lymphs Abs 1.6 0.7 - 4.0 K/uL   Monocytes Relative 11 %    Monocytes Absolute 0.8 0.1 - 1.0 K/uL   Eosinophils Relative 5 %   Eosinophils Absolute 0.3 0.0 - 0.5 K/uL   Basophils Relative 1 %   Basophils Absolute 0.1 0.0 - 0.1 K/uL   Immature Granulocytes 1 %   Abs Immature Granulocytes 0.04 0.00 - 0.07 K/uL  Comprehensive metabolic panel     Status: Abnormal   Collection Time: 10/11/18  4:26 PM  Result Value Ref Range   Sodium 142 135 - 145 mmol/L   Potassium 3.3 (L) 3.5 - 5.1 mmol/L   Chloride 107 98 - 111 mmol/L   CO2 25 22 - 32 mmol/L   Glucose, Bld 97 70 - 99 mg/dL   BUN 54 (H) 8 - 23 mg/dL   Creatinine, Ser 5.97 (H) 0.61 - 1.24 mg/dL   Calcium 8.7 (L) 8.9 - 10.3 mg/dL   Total Protein 7.2 6.5 - 8.1 g/dL   Albumin 3.1 (L) 3.5 - 5.0 g/dL   AST 26 15 - 41 U/L   ALT 19 0 - 44 U/L   Alkaline Phosphatase 46 38 - 126 U/L   Total Bilirubin 0.5 0.3 - 1.2 mg/dL   GFR calc non Af Amer 9 (L) >60 mL/min   GFR calc Af Amer 10 (L) >60 mL/min   Anion gap 10 5 - 15  Lactic acid, plasma     Status: None   Collection Time: 10/11/18  4:30 PM  Result Value Ref Range   Lactic Acid, Venous 1.1 0.5 - 1.9 mmol/L  SARS Coronavirus 2 (CEPHEID - Performed in [place] hospital lab), Hosp Order     Status: None   Collection Time: 10/11/18  6:24 PM  Result Value Ref Range   SARS Coronavirus 2 NEGATIVE NEGATIVE   ____________________________________________ ____________________________________________  RADIOLOGY  I personally reviewed all radiographic images ordered to evaluate for the above acute complaints and reviewed radiology reports and findings.  These findings were personally discussed with the patient.  Please see medical record for radiology report.  ____________________________________________   PROCEDURES  Procedure(s) performed:  Procedures    Critical Care performed: no ____________________________________________   INITIAL IMPRESSION / ASSESSMENT AND PLAN / ED COURSE  Pertinent labs & imaging results that were available  during my care of the patient were reviewed by me and considered in my medical decision making (see chart  for details).   DDX: Cellulitis, abscess, osteo-, gangrene  DEADRICK STIDD is a 73 y.o. who presents to the ED with symptoms as described above.  Patient with foul-smelling purulent drainage from the left foot.  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  Will give antibiotics.  Will check blood work.    Clinical Course as of Oct 10 1957  Mon Oct 11, 2018  1749 Blood work reassuring no evidence of sepsis but patient with very poor perfusion distally likely secondary to poor EF and chronic PAD.  Given gangrenous changes with purulent drainage will admit to the hospital for IV antibiotics and further evaluation.   [PR]    Clinical Course User Index [PR] Merlyn Lot, MD    The patient was evaluated in Emergency Department today for the symptoms described in the history of present illness. He/she was evaluated in the context of the global COVID-19 pandemic, which necessitated consideration that the patient might be at risk for infection with the SARS-CoV-2 virus that causes COVID-19. Institutional protocols and algorithms that pertain to the evaluation of patients at risk for COVID-19 are in a state of rapid change based on information released by regulatory bodies including the CDC and federal and state organizations. These policies and algorithms were followed during the patient's care in the ED.  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    Merlyn Lot, MD 10/11/18 [phone]

## 2018-10-11 NOTE — Progress Notes (Addendum)
Pharmacy Antibiotic Note  Jose [name] is a 73 y.o. male admitted on 10/11/2018 with wound infection.  Pharmacy has been consulted for Cefepime dosing.  Pt is on HD at home, current HD scheduled is not set.    Plan: Will start Cefepime 1 gm IV Q24H on 5/11 @ 2130.   Vancomycin 1250 mg IV X 1 given in ED on 5/11 @ 2030.  Vancomycin 750 mg IV ordered to be given with each HD session.  No trough currently ordered.  Will need to f/u to order trough and schedule vanc doses.    Height: 6\' 1"  (185.4 cm) Weight: 150 lb (68 kg) IBW/kg (Calculated) : 79.9  Temp (24hrs), Avg:98.6 F (37 C), Min:98 F (36.7 C), Max:99.1 F (37.3 C)  Recent Labs  Lab 10/11/18 1626 10/11/18 1630  WBC 7.3  --   CREATININE 5.97*  --   LATICACIDVEN  --  1.1    Estimated Creatinine Clearance: 10.8 mL/min (A) (by C-G formula based on SCr of 5.97 mg/dL (H)).    Allergies  Allergen Reactions  . Penicillin G Other (See Comments)    Other reaction(s): Unknown DID THE REACTION INVOLVE: Swelling of the face/tongue/throat, SOB, or low BP? Unknown Sudden or severe rash/hives, skin peeling, or the inside of the mouth or nose? Unknown Did it require medical treatment? Unknown When did it last happen?unknown If all above answers are "NO", may proceed with cephalosporin use.     Antimicrobials this admission:   >>    >>   Dose adjustments this admission:  Microbiology results:  BCx:   UCx:    Sputum:    MRSA PCR:   Thank you for allowing pharmacy to be a part of this patient's care.  Ezeriah Luty D 10/11/2018 9:34 PM

## 2018-10-11 NOTE — Telephone Encounter (Signed)
I spoke with the social worker and she has been informed with Dr Lucky Cowboy medical advice she stated that she will inform the patient and home health nurse with medical advice. I called in Tramadol 50mg  #30 take 1 tablet every 6 hours as needed for pain with no refills into [facility] in [place]

## 2018-10-11 NOTE — ED Notes (Addendum)
Pt uprite on stretcher in exam room, resting quietly with eyes closed; resp even/unlab with no distress noted; pt awakens easily & denies c/o at present

## 2018-10-11 NOTE — Progress Notes (Signed)
Family Meeting Note  Advance Directive:yes  Today a meeting took place with the Patient.   The following clinical team members were present during this meeting:MD  The following were discussed:Patient's diagnosis: Ischemic foot, end-stage renal disease on hemodialysis, diabetes, Patient's progosis: Unable to determine and Goals for treatment: Full Code  Additional follow-up to be provided: Vascular and nephrology  Time spent during discussion:20 minutes  Vaughan Basta, MD

## 2018-10-11 NOTE — ED Notes (Signed)
ED TO INPATIENT HANDOFF REPORT  ED Nurse Name and Phone #: Lattie Haw [phone]  S Name/Age/Gender Jose [name] 73 y.o. male Room/Bed: ED02A/ED02A  Code Status   Code Status: Prior  Home/SNF/Other Home Patient oriented to: self, place, time and situation Is this baseline? Yes   Triage Complete: Triage complete  Chief Complaint Sent by MD/Bilat Foot Pain  Triage Note Pt states his toes are burnging. Pt reports he is a diabetic and he had some toes amputated 2 weeks ago and but they are still burning. Pt with bandage around left foot and right foot toes are black. Pt states not normal for him.   Allergies Allergies  Allergen Reactions  . Penicillin G Other (See Comments)    Other reaction(s): Unknown DID THE REACTION INVOLVE: Swelling of the face/tongue/throat, SOB, or low BP? Unknown Sudden or severe rash/hives, skin peeling, or the inside of the mouth or nose? Unknown Did it require medical treatment? Unknown When did it last happen?unknown If all above answers are "NO", may proceed with cephalosporin use.     Level of Care/Admitting Diagnosis ED Disposition    ED Disposition Condition [facility] Area: [place] [100120]  Level of Care: Med-Surg [16]  Covid Evaluation: N/A  Diagnosis: Ischemic foot [466599]  Admitting Physician: Vaughan Basta [[id number]]  Attending Physician: Vaughan Basta [phone]  Estimated length of stay: past midnight tomorrow  Certification:: I certify this patient will need inpatient services for at least 2 midnights  PT Class (Do Not Modify): Inpatient [101]  PT Acc Code (Do Not Modify): Private [1]       B Medical/Surgery History Past Medical History:  Diagnosis Date  . Chronic kidney disease   . Depression   . Diabetes mellitus without complication ([place])   . Glaucoma   . Glaucoma   . Gout   . Heart murmur   . HLD (hyperlipidemia)   . Hypertension   . Prostate cancer ([place])    . Renal failure    dialysis t/t/s  . Stroke ([place])    tia x 2   Past Surgical History:  Procedure Laterality Date  . AMPUTATION TOE Bilateral 07/23/2018   Procedure: TOE MPJ RIGHT 3RD AND LEFT 2ND;  Surgeon: Samara Deist, DPM;  Location: [facility]S;  Service: Podiatry;  Laterality: Bilateral;  . APPENDECTOMY    . AV FISTULA PLACEMENT Left 06/09/2018   Procedure: ARTERIOVENOUS (AV) FISTULA CREATION ( BRACHIAL CEPHALIC);  Surgeon: Algernon Huxley, MD;  Location: [facility] OR[facility];  Service: Vascular;  Laterality: Left;  . CHOLECYSTECTOMY    . DIALYSIS/PERMA CATHETER INSERTION N/A 03/24/2018   Procedure: DIALYSIS/PERMA CATHETER INSERTION;  Surgeon: Katha Cabal, MD;  Location: [place] [facility];  Service: Cardiovascular;  Laterality: N/A;  . DIALYSIS/PERMA CATHETER REMOVAL N/A 09/06/2018   Procedure: DIALYSIS/PERMA CATHETER REMOVAL;  Surgeon: Algernon Huxley, MD;  Location: [place] [facility];  Service: Cardiovascular;  Laterality: N/A;  . INSERTION PROSTATE RADIATION SEED    . LOWER EXTREMITY ANGIOGRAPHY Right 06/16/2018   Procedure: LOWER EXTREMITY ANGIOGRAPHY;  Surgeon: Algernon Huxley, MD;  Location: [place] [facility];  Service: Cardiovascular;  Laterality: Right;  . LOWER EXTREMITY ANGIOGRAPHY Left 08/30/2018   Procedure: LOWER EXTREMITY ANGIOGRAPHY;  Surgeon: Algernon Huxley, MD;  Location: [place] [facility];  Service: Cardiovascular;  Laterality: Left;  . LOWER EXTREMITY ANGIOGRAPHY Right 09/30/2018   Procedure: LOWER EXTREMITY ANGIOGRAPHY;  Surgeon: Algernon Huxley, MD;  Location: [place] [facility];  Service: Cardiovascular;  Laterality: Right;  . LOWER EXTREMITY ANGIOGRAPHY Left 10/07/2018   Procedure: LOWER EXTREMITY ANGIOGRAPHY;  Surgeon: Algernon Huxley, MD;  Location: [place] [facility];  Service: Cardiovascular;  Laterality: Left;     A IV Location/Drains/Wounds Patient Lines/Drains/Airways Status   Active Line/Drains/Airways    Name:   Placement date:   Placement time:   Site:   Days:    Peripheral IV 10/11/18 Right Forearm   10/11/18    1627    Forearm   less than 1   Fistula / Graft Left Forearm Arteriovenous fistula   06/09/18    1325    Forearm   124   Hemodialysis Catheter Right Internal jugular   03/24/18    1658    Internal jugular   201   Sheath 08/30/18 Right   08/30/18    1133    -   42   Sheath 09/30/18 Left Arterial;Femoral   09/30/18    0840    Arterial;Femoral   11   Sheath 10/07/18 Right Arterial;Femoral   10/07/18    0927    Arterial;Femoral   4   Incision (Closed) 06/09/18 Arm Left   06/09/18    1323     124   Incision (Closed) 06/16/18 Thigh Anterior;Left   06/16/18    1530     117   Incision (Closed) 07/23/18 Foot   07/23/18    0944     80          Intake/Output Last 24 hours  Intake/Output Summary (Last 24 hours) at 10/11/2018 2205 Last data filed at 10/11/2018 2045 Gross per 24 hour  Intake 300 ml  Output -  Net 300 ml    Labs/Imaging Results for orders placed or performed during the hospital encounter of 10/11/18 (from the past 48 hour(s))  CBC with Differential/Platelet     Status: Abnormal   Collection Time: 10/11/18  4:26 PM  Result Value Ref Range   WBC 7.3 4.0 - 10.5 K/uL   RBC 3.20 (L) 4.22 - 5.81 MIL/uL   Hemoglobin 10.0 (L) 13.0 - 17.0 g/dL   HCT 30.7 (L) 39.0 - 52.0 %   MCV 95.9 80.0 - 100.0 fL   MCH 31.3 26.0 - 34.0 pg   MCHC 32.6 30.0 - 36.0 g/dL   RDW 18.9 (H) 11.5 - 15.5 %   Platelets 159 150 - 400 K/uL   nRBC 0.0 0.0 - 0.2 %   Neutrophils Relative % 60 %   Neutro Abs 4.4 1.7 - 7.7 K/uL   Lymphocytes Relative 22 %   Lymphs Abs 1.6 0.7 - 4.0 K/uL   Monocytes Relative 11 %   Monocytes Absolute 0.8 0.1 - 1.0 K/uL   Eosinophils Relative 5 %   Eosinophils Absolute 0.3 0.0 - 0.5 K/uL   Basophils Relative 1 %   Basophils Absolute 0.1 0.0 - 0.1 K/uL   Immature Granulocytes 1 %   Abs Immature Granulocytes 0.04 0.00 - 0.07 K/uL    Comment: Performed at [facility], [address] [place], [place] [address]   Comprehensive metabolic panel     Status: Abnormal   Collection Time: 10/11/18  4:26 PM  Result Value Ref Range   Sodium 142 135 - 145 mmol/L   Potassium 3.3 (L) 3.5 - 5.1 mmol/L   Chloride 107 98 - 111 mmol/L   CO2 25 22 - 32 mmol/L   Glucose, Bld 97 70 - 99 mg/dL   BUN  54 (H) 8 - 23 mg/dL   Creatinine, Ser 5.97 (H) 0.61 - 1.24 mg/dL   Calcium 8.7 (L) 8.9 - 10.3 mg/dL   Total Protein 7.2 6.5 - 8.1 g/dL   Albumin 3.1 (L) 3.5 - 5.0 g/dL   AST 26 15 - 41 U/L   ALT 19 0 - 44 U/L   Alkaline Phosphatase 46 38 - 126 U/L   Total Bilirubin 0.5 0.3 - 1.2 mg/dL   GFR calc non Af Amer 9 (L) >60 mL/min   GFR calc Af Amer 10 (L) >60 mL/min   Anion gap 10 5 - 15    Comment: Performed at [facility], [address] [place], [place] [address]  Lactic acid, plasma     Status: None   Collection Time: 10/11/18  4:30 PM  Result Value Ref Range   Lactic Acid, Venous 1.1 0.5 - 1.9 mmol/L    Comment: Performed at [facility], [address] [place], [place] [address]  SARS Coronavirus 2 (CEPHEID - Performed in [place] hospital lab), Hosp Order     Status: None   Collection Time: 10/11/18  6:24 PM  Result Value Ref Range   SARS Coronavirus 2 NEGATIVE NEGATIVE    Comment: (NOTE) If result is NEGATIVE SARS-CoV-2 target nucleic acids are NOT DETECTED. The SARS-CoV-2 RNA is generally detectable in upper and lower  respiratory specimens during the acute phase of infection. The lowest  concentration of SARS-CoV-2 viral copies this assay can detect is 250  copies / mL. A negative result does not preclude SARS-CoV-2 infection  and should not be used as the sole basis for treatment or other  patient management decisions.  A negative result may occur with  improper specimen collection / handling, submission of specimen other  than nasopharyngeal swab, presence of viral mutation(s) within the  areas targeted by this assay, and inadequate number of viral copies  (<250 copies /  mL). A negative result must be combined with clinical  observations, patient history, and epidemiological information. If result is POSITIVE SARS-CoV-2 target nucleic acids are DETECTED. The SARS-CoV-2 RNA is generally detectable in upper and lower  respiratory specimens dur ing the acute phase of infection.  Positive  results are indicative of active infection with SARS-CoV-2.  Clinical  correlation with patient history and other diagnostic information is  necessary to determine patient infection status.  Positive results do  not rule out bacterial infection or co-infection with other viruses. If result is PRESUMPTIVE POSTIVE SARS-CoV-2 nucleic acids MAY BE PRESENT.   A presumptive positive result was obtained on the submitted specimen  and confirmed on repeat testing.  While 2019 novel coronavirus  (SARS-CoV-2) nucleic acids may be present in the submitted sample  additional confirmatory testing may be necessary for epidemiological  and / or clinical management purposes  to differentiate between  SARS-CoV-2 and other Sarbecovirus currently known to infect humans.  If clinically indicated additional testing with an alternate test  methodology [phone]) is advised. The SARS-CoV-2 RNA is generally  detectable in upper and lower respiratory sp ecimens during the acute  phase of infection. The expected result is Negative. Fact Sheet for Patients:  StrictlyIdeas.no Fact Sheet for Healthcare Providers: BankingDealers.co.za This test is not yet approved or cleared by the [place] FDA and has been authorized for detection and/or diagnosis of SARS-CoV-2 by FDA under an Emergency Use Authorization (EUA).  This EUA will remain in effect (meaning this test can be used) for the duration  of the COVID-19 declaration under Section 564(b)(1) of the Act, 21 U.S.C. section 360bbb-3(b)(1), unless the authorization is terminated or revoked  sooner. Performed at [facility], [address] [place], [place] [address]    Dg Foot Complete Left  Result Date: 10/11/2018 CLINICAL DATA:  Diabetic patient with a burning sensation in the left toes. 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 EXAM: LEFT FOOT - COMPLETE 3+ VIEW COMPARISON:  None. FINDINGS: No acute bony or joint abnormality is identified. No bony destructive change or periosteal reaction is seen. The second toe has been amputated. No soft tissue gas or radiopaque foreign body is noted. Soft tissues of the foot are diffusely swollen. IMPRESSION: Diffuse soft tissue swelling without underlying acute bony or joint abnormality. Status post amputation of the second toe. Electronically Signed   By: Inge Rise M.D.   On: 10/11/2018 17:24    Pending Labs Unresulted Labs (From admission, onward)    Start     Ordered   10/11/18 1630  Wound or Superficial Culture  ONCE - STAT,   STAT    Question:  Patient immune status  Answer:  Immunocompromised   10/11/18 1629   Signed and Held  Basic metabolic panel  Tomorrow morning,   R     Signed and Held   Signed and Held  CBC  Tomorrow morning,   R     Signed and Held   Signed and Held  CBC  (heparin)  Once,   R    Comments:  Baseline for heparin therapy IF NOT ALREADY DRAWN.  Notify MD if PLT < 100 K.    Signed and Held   Signed and Held  Creatinine, serum  (heparin)  Once,   R    Comments:  Baseline for heparin therapy IF NOT ALREADY DRAWN.    Signed and Held          Vitals/Pain Today's Vitals   10/11/18 1830 10/11/18 1900 10/11/18 2000 10/11/18 2100  BP: 139/81 130/75 123/83 138/79  Pulse: 76 80 (!) 50   Resp: 16 18 16 13   Temp:  98 F (36.7 C)    TempSrc:  Oral    SpO2: 95% 94% 95% 97%  Weight:      Height:      PainSc:  0-No pain 0-No pain 0-No pain    Isolation Precautions No active isolations  Medications Medications  vancomycin (VANCOCIN) 1,250 mg in sodium chloride 0.9 % 250 mL IVPB  (1,250 mg Intravenous New Bag/Given 10/11/18 2055)  insulin aspart (novoLOG) injection 0-9 Units (has no administration in time range)  insulin aspart (novoLOG) injection 0-5 Units (has no administration in time range)  nicotine (NICODERM CQ - dosed in mg/24 hours) patch 21 mg (has no administration in time range)  ceFEPIme (MAXIPIME) 1 g in sodium chloride 0.9 % 100 mL IVPB (has no administration in time range)  vancomycin (VANCOCIN) IVPB 750 mg/150 ml premix (has no administration in time range)  oxyCODONE-acetaminophen (PERCOCET/ROXICET) 5-325 MG per tablet 2 tablet (2 tablets Oral Given 10/11/18 1656)  ciprofloxacin (CIPRO) IVPB 400 mg (0 mg Intravenous Stopped 10/11/18 2045)  clindamycin (CLEOCIN) IVPB 600 mg (0 mg Intravenous Stopped 10/11/18 1941)    Mobility walks with device Low fall risk   Focused Assessments    R Recommendations: See Admitting Provider Note  Report given to:   Additional Notes: none

## 2018-10-11 NOTE — H&P (Signed)
[facility] NAME: Jose [name]    MR#:  [id number]  DATE OF BIRTH:  August 01, 1945  DATE OF ADMISSION:  10/11/2018  PRIMARY CARE PHYSICIAN: [name], MD   REQUESTING/REFERRING PHYSICIAN: Quentin Cornwall  CHIEF COMPLAINT:   Chief Complaint  Patient presents with  . Foot Burn    HISTORY OF PRESENT ILLNESS: Jose [name]  is a 73 y.o. male with a known history of chronic kidney disease on dialysis, depression, diabetes, glaucoma, gout, hyperlipidemia, hypertension, stroke-was having ischemic changes on both feet and seen by vascular for angioplasty last week.  He continued to have pain in the feet and some discharge with swelling on the left foot so came to emergency room.  Noted to have significant ischemic changes in both feet particularly in toes on the right side and some localized swelling but no bony involvement on x-rays on left foot. Concerned with this ER physician suggested to admit to hospitalist service for empiric antibiotics and vascular consult for possible amputation.  PAST MEDICAL HISTORY:   Past Medical History:  Diagnosis Date  . Chronic kidney disease   . Depression   . Diabetes mellitus without complication ([place])   . Glaucoma   . Glaucoma   . Gout   . Heart murmur   . HLD (hyperlipidemia)   . Hypertension   . Prostate cancer ([place])   . Renal failure    dialysis t/t/s  . Stroke ([place])    tia x 2    PAST SURGICAL HISTORY:  Past Surgical History:  Procedure Laterality Date  . AMPUTATION TOE Bilateral 07/23/2018   Procedure: TOE MPJ RIGHT 3RD AND LEFT 2ND;  Surgeon: Samara Deist, DPM;  Location: [facility];  Service: Podiatry;  Laterality: Bilateral;  . APPENDECTOMY    . AV FISTULA PLACEMENT Left 06/09/2018   Procedure: ARTERIOVENOUS (AV) FISTULA CREATION ( BRACHIAL CEPHALIC);  Surgeon: Algernon Huxley, MD;  Location: [facility] OR[facility];  Service: Vascular;  Laterality: Left;  . CHOLECYSTECTOMY    . DIALYSIS/PERMA CATHETER  INSERTION N/A 03/24/2018   Procedure: DIALYSIS/PERMA CATHETER INSERTION;  Surgeon: Katha Cabal, MD;  Location: [place] [facility];  Service: Cardiovascular;  Laterality: N/A;  . DIALYSIS/PERMA CATHETER REMOVAL N/A 09/06/2018   Procedure: DIALYSIS/PERMA CATHETER REMOVAL;  Surgeon: Algernon Huxley, MD;  Location: [place] [facility];  Service: Cardiovascular;  Laterality: N/A;  . INSERTION PROSTATE RADIATION SEED    . LOWER EXTREMITY ANGIOGRAPHY Right 06/16/2018   Procedure: LOWER EXTREMITY ANGIOGRAPHY;  Surgeon: Algernon Huxley, MD;  Location: [place] [facility];  Service: Cardiovascular;  Laterality: Right;  . LOWER EXTREMITY ANGIOGRAPHY Left 08/30/2018   Procedure: LOWER EXTREMITY ANGIOGRAPHY;  Surgeon: Algernon Huxley, MD;  Location: [place] [facility];  Service: Cardiovascular;  Laterality: Left;  . LOWER EXTREMITY ANGIOGRAPHY Right 09/30/2018   Procedure: LOWER EXTREMITY ANGIOGRAPHY;  Surgeon: Algernon Huxley, MD;  Location: [place] [facility];  Service: Cardiovascular;  Laterality: Right;  . LOWER EXTREMITY ANGIOGRAPHY Left 10/07/2018   Procedure: LOWER EXTREMITY ANGIOGRAPHY;  Surgeon: Algernon Huxley, MD;  Location: [place] [facility];  Service: Cardiovascular;  Laterality: Left;    SOCIAL HISTORY:  Social History   Tobacco Use  . Smoking status: Current Some Day Smoker    Packs/day: 0.10    Types: Cigarettes  . Smokeless tobacco: Never Used  Substance Use Topics  . Alcohol use: Not Currently    Alcohol/week: 0.0 standard drinks    Frequency: Never  FAMILY HISTORY:  Family History  Problem Relation Age of Onset  . Varicose Veins Neg Hx     DRUG ALLERGIES:  Allergies  Allergen Reactions  . Penicillin G Other (See Comments)    Other reaction(s): Unknown DID THE REACTION INVOLVE: Swelling of the face/tongue/throat, SOB, or low BP? Unknown Sudden or severe rash/hives, skin peeling, or the inside of the mouth or nose? Unknown Did it require medical treatment? Unknown When  did it last happen?unknown If all above answers are "NO", may proceed with cephalosporin use.     REVIEW OF SYSTEMS:   CONSTITUTIONAL: No fever, fatigue or weakness.  EYES: No blurred or double vision.  EARS, NOSE, AND THROAT: No tinnitus or ear pain.  RESPIRATORY: No cough, shortness of breath, wheezing or hemoptysis.  CARDIOVASCULAR: No chest pain, orthopnea, edema.  GASTROINTESTINAL: No nausea, vomiting, diarrhea or abdominal pain.  GENITOURINARY: No dysuria, hematuria.  ENDOCRINE: No polyuria, nocturia,  HEMATOLOGY: No anemia, easy bruising or bleeding SKIN: No rash or lesion. MUSCULOSKELETAL: No joint pain or arthritis.   NEUROLOGIC: No tingling, numbness, weakness.  PSYCHIATRY: No anxiety or depression.   MEDICATIONS AT HOME:  Prior to Admission medications   Medication Sig Start Date End Date Taking? Authorizing Provider  allopurinol (ZYLOPRIM) 100 MG tablet Take 100 mg by mouth daily.    Yes [provider]  amLODipine (NORVASC) 10 MG tablet Take 10 mg by mouth daily.    Yes [provider]  aspirin EC 81 MG EC tablet Take 1 tablet (81 mg total) by mouth daily. 06/18/18  Yes Dustin Flock, MD  atorvastatin (LIPITOR) 10 MG tablet Take 1 tablet (10 mg total) by mouth daily at 6 PM. 08/30/18  Yes Stegmayer, Joelene Millin A, PA-C  cloNIDine (CATAPRES) 0.2 MG tablet TAKE 1 TABLET BY MOUTH TWICE A DAY Patient taking differently: Take 0.2 mg by mouth 2 (two) times daily.  05/14/18  Yes Dew, Erskine Squibb, MD  clopidogrel (PLAVIX) 75 MG tablet Take 1 tablet (75 mg total) by mouth daily. 06/18/18  Yes Dustin Flock, MD  doxycycline (VIBRAMYCIN) 100 MG capsule Take 1 capsule (100 mg total) by mouth 2 (two) times daily. 09/27/18  Yes Kris Hartmann, NP  escitalopram (LEXAPRO) 10 MG tablet Take 10 mg by mouth daily.    Yes [provider]  gabapentin (NEURONTIN) 300 MG capsule Take 1 capsule (300 mg total) by mouth 3 (three) times daily. Patient taking differently:  Take 100 mg by mouth 3 (three) times daily.  09/27/18 01/25/19 Yes Kris Hartmann, NP  glipiZIDE (GLUCOTROL) 5 MG tablet Take 5 mg by mouth daily before breakfast.    Yes [provider]  hydrALAZINE (APRESOLINE) 25 MG tablet Take 1 tablet (25 mg total) by mouth every 8 (eight) hours. 03/27/18  Yes Wieting, Richard, MD  oxyCODONE-acetaminophen (PERCOCET) 7.5-325 MG tablet Take 1 tablet by mouth every 4 (four) hours as needed for severe pain. 09/06/18  Yes Dew, Erskine Squibb, MD  multivitamin (RENA-VIT) TABS tablet Take 1 tablet by mouth at bedtime. Patient not taking: Reported on 10/11/2018 03/27/18   Loletha Grayer, MD  Nutritional Supplements (FEEDING SUPPLEMENT, NEPRO CARB STEADY,) LIQD Take 237 mLs by mouth 2 (two) times daily between meals. Patient not taking: Reported on 10/11/2018 03/27/18   Loletha Grayer, MD      PHYSICAL EXAMINATION:   VITAL SIGNS: Blood pressure 138/79, pulse (!) 50, temperature 98 F (36.7 C), temperature source Oral, resp. rate 13, height 6\' 1"  (1.854 m), weight 68 kg,  SpO2 97 %.  GENERAL:  73 y.o.-year-old patient lying in the bed with no acute distress.  EYES: Pupils equal, round, reactive to light and accommodation. No scleral icterus. Extraocular muscles intact.  HEENT: Head atraumatic, normocephalic. Oropharynx and nasopharynx clear.  NECK:  Supple, no jugular venous distention. No thyroid enlargement, no tenderness.  LUNGS: Normal breath sounds bilaterally, no wheezing, rales,rhonchi or crepitation. No use of accessory muscles of respiration.  CARDIOVASCULAR: S1, S2 normal. No murmurs, rubs, or gallops.  ABDOMEN: Soft, nontender, nondistended. Bowel sounds present. No organomegaly or mass.  EXTREMITIES: Bilateral feet have ischemic changes worse in the right with toes turning black.  Already has some amputated toes on both side.  Swelling on both feet.  NEUROLOGIC: Cranial nerves II through XII are intact. Muscle strength 4/5 in all extremities. Sensation  intact. Gait not checked.  PSYCHIATRIC: The patient is alert and oriented x 3.  SKIN: No obvious rash, lesion, or ulcer.   LABORATORY PANEL:   CBC Recent Labs  Lab 10/11/18 1626  WBC 7.3  HGB 10.0*  HCT 30.7*  PLT 159  MCV 95.9  MCH 31.3  MCHC 32.6  RDW 18.9*  LYMPHSABS 1.6  MONOABS 0.8  EOSABS 0.3  BASOSABS 0.1   ------------------------------------------------------------------------------------------------------------------  Chemistries  Recent Labs  Lab 10/11/18 1626  NA 142  K 3.3*  CL 107  CO2 25  GLUCOSE 97  BUN 54*  CREATININE 5.97*  CALCIUM 8.7*  AST 26  ALT 19  ALKPHOS 46  BILITOT 0.5   ------------------------------------------------------------------------------------------------------------------ estimated creatinine clearance is 10.8 mL/min (A) (by C-G formula based on SCr of 5.97 mg/dL (H)). ------------------------------------------------------------------------------------------------------------------ No results for input(s): TSH, T4TOTAL, T3FREE, THYROIDAB in the last 72 hours.  Invalid input(s): FREET3   Coagulation profile No results for input(s): INR, PROTIME in the last 168 hours. ------------------------------------------------------------------------------------------------------------------- No results for input(s): DDIMER in the last 72 hours. -------------------------------------------------------------------------------------------------------------------  Cardiac Enzymes No results for input(s): CKMB, TROPONINI, MYOGLOBIN in the last 168 hours.  Invalid input(s): CK ------------------------------------------------------------------------------------------------------------------ Invalid input(s): POCBNP  ---------------------------------------------------------------------------------------------------------------  Urinalysis    Component Value Date/Time   COLORURINE YELLOW (A) 07/08/2018 1256   APPEARANCEUR CLEAR (A)  07/08/2018 1256   LABSPEC 1.014 07/08/2018 1256   PHURINE 6.0 07/08/2018 1256   GLUCOSEU NEGATIVE 07/08/2018 1256   HGBUR NEGATIVE 07/08/2018 1256   BILIRUBINUR NEGATIVE 07/08/2018 1256   KETONESUR NEGATIVE 07/08/2018 1256   PROTEINUR 100 (A) 07/08/2018 1256   NITRITE NEGATIVE 07/08/2018 1256   LEUKOCYTESUR NEGATIVE 07/08/2018 1256     RADIOLOGY: Dg Foot Complete Left  Result Date: 10/11/2018 CLINICAL DATA:  Diabetic patient with a burning sensation in the left toes. 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 EXAM: LEFT FOOT - COMPLETE 3+ VIEW COMPARISON:  None. FINDINGS: No acute bony or joint abnormality is identified. No bony destructive change or periosteal reaction is seen. The second toe has been amputated. No soft tissue gas or radiopaque foreign body is noted. Soft tissues of the foot are diffusely swollen. IMPRESSION: Diffuse soft tissue swelling without underlying acute bony or joint abnormality. Status post amputation of the second toe. Electronically Signed   By: Inge Rise M.D.   On: 10/11/2018 17:24    EKG: Orders placed or performed during the hospital encounter of 09/30/18  . EKG 12-Lead  . EKG 12-Lead  . EKG 12-Lead  . EKG 12-Lead    IMPRESSION AND PLAN:  *Ischemic foot Peripheral arterial disease due to diabetes  Vascular consult-I texted, Dr. dew had done procedure last  week.  *Cellulitis There is some discharge from left foot, empiric antibiotics. Most likely he may need amputation.  *End-stage renal disease on hemodialysis Nephrology consult to help.  *Diabetes We will hold oral medication and give sliding scale coverage.  *Hypertension Continue home medications.  *Active smoking Patient have agreed to quit smoking for now.  We will provide nicotine patch.  Counseling done four minutes.  All the records are reviewed and case discussed with ED provider. Management plans discussed with the patient, family and they are in agreement.  CODE  STATUS:full. Code Status History    Date Active Date Inactive Code Status Order ID Comments User Context   10/07/2018 1042 10/07/2018 1733 Full Code [id number]  Algernon Huxley, MD Inpatient   09/30/2018 0956 09/30/2018 1441 Full Code [id number]  Algernon Huxley, MD Inpatient   08/30/2018 1224 08/30/2018 2034 Full Code [id number]  Algernon Huxley, MD Inpatient   07/23/2018 1111 07/23/2018 1517 Full Code [id number]  Samara Deist, [facility]   06/13/2018 0135 06/17/2018 2206 Full Code [id number]  Lance Coon, MD ED   03/21/2018 1315 03/28/2018 1004 Full Code [id number]  Saundra Shelling, MD ED       TOTAL TIME TAKING CARE OF THIS PATIENT: 35 minutes.    Vaughan Basta M.D on 10/11/2018   Between 7am to 6pm - Pager - [phone]  After 6pm go to [email or web address] - password [email or web address] [facility]  Office  [phone]  CC: Primary care physician; [name], MD   Note: This dictation was prepared with Dragon dictation along with smaller phrase technology. Any transcriptional errors that result from this process are unintentional.

## 2018-10-11 NOTE — ED Triage Notes (Signed)
Pt states his toes are burnging. Pt reports he is a diabetic and he had some toes amputated 2 weeks ago and but they are still burning. Pt with bandage around left foot and right foot toes are black. Pt states not normal for him.

## 2018-10-11 NOTE — ED Notes (Signed)
hospitalist in to see pt.

## 2018-10-12 ENCOUNTER — Encounter: Payer: Self-pay | Admitting: Anesthesiology

## 2018-10-12 LAB — CBC
HCT: 30.6 % — ABNORMAL LOW (ref 39.0–52.0)
Hemoglobin: 9.9 g/dL — ABNORMAL LOW (ref 13.0–17.0)
MCH: 30.9 pg (ref 26.0–34.0)
MCHC: 32.4 g/dL (ref 30.0–36.0)
MCV: 95.6 fL (ref 80.0–100.0)
Platelets: 146 10*3/uL — ABNORMAL LOW (ref 150–400)
RBC: 3.2 MIL/uL — ABNORMAL LOW (ref 4.22–5.81)
RDW: 19 % — ABNORMAL HIGH (ref 11.5–15.5)
WBC: 6.5 10*3/uL (ref 4.0–10.5)
nRBC: 0 % (ref 0.0–0.2)

## 2018-10-12 LAB — GLUCOSE, CAPILLARY
Glucose-Capillary: 103 mg/dL — ABNORMAL HIGH (ref 70–99)
Glucose-Capillary: 106 mg/dL — ABNORMAL HIGH (ref 70–99)
Glucose-Capillary: 109 mg/dL — ABNORMAL HIGH (ref 70–99)
Glucose-Capillary: 112 mg/dL — ABNORMAL HIGH (ref 70–99)
Glucose-Capillary: 96 mg/dL (ref 70–99)

## 2018-10-12 LAB — BASIC METABOLIC PANEL
Anion gap: 12 (ref 5–15)
BUN: 58 mg/dL — ABNORMAL HIGH (ref 8–23)
CO2: 23 mmol/L (ref 22–32)
Calcium: 8.4 mg/dL — ABNORMAL LOW (ref 8.9–10.3)
Chloride: 103 mmol/L (ref 98–111)
Creatinine, Ser: 6.11 mg/dL — ABNORMAL HIGH (ref 0.61–1.24)
GFR calc Af Amer: 10 mL/min — ABNORMAL LOW (ref >60)
GFR calc non Af Amer: 8 mL/min — ABNORMAL LOW (ref >60)
Glucose, Bld: 116 mg/dL — ABNORMAL HIGH (ref 70–99)
Potassium: 3.6 mmol/L (ref 3.5–5.1)
Sodium: 138 mmol/L (ref 135–145)

## 2018-10-12 MED ORDER — EPOETIN ALFA 10000 UNIT/ML IJ SOLN: 4000.0000 [IU] | INTRAMUSCULAR | Status: DC

## 2018-10-12 MED ORDER — HEPARIN SODIUM (PORCINE) 1000 UNIT/ML DIALYSIS
20.0000 [IU]/kg | INTRAMUSCULAR | Status: DC | PRN
  Administered 2018-10-12: 1400 [IU] via INTRAVENOUS_CENTRAL
  Filled 2018-10-12: qty 1.4

## 2018-10-12 MED ORDER — VANCOMYCIN HCL IN DEXTROSE 750-5 MG/150ML-% IV SOLN
750.0000 mg | INTRAVENOUS | Status: DC
  Administered 2018-10-12: 750 mg via INTRAVENOUS
  Filled 2018-10-12 (×3): qty 150

## 2018-10-12 MED ORDER — CHLORHEXIDINE GLUCONATE CLOTH 2 % EX PADS
6.0000 | MEDICATED_PAD | Freq: Every day | CUTANEOUS | Status: DC
  Administered 2018-10-12 – 2018-10-13 (×2): 6 via TOPICAL

## 2018-10-12 MED ORDER — TRAZODONE HCL 50 MG PO TABS
50.0000 mg | ORAL_TABLET | Freq: Every day | ORAL | Status: DC
  Administered 2018-10-13 – 2018-10-15 (×3): 50 mg via ORAL
  Filled 2018-10-12 (×5): qty 1

## 2018-10-12 NOTE — Progress Notes (Signed)
Post HD Tx    10/12/18 1317  Vital Signs  Temp 98.4 F (36.9 C)  Temp Source Oral  Pulse Rate (!) 58  Pulse Rate Source Monitor  Resp 16  BP (!) 145/98  BP Location Right Arm  BP Method Automatic  Patient Position (if appropriate) Lying  Oxygen Therapy  SpO2 100 %  O2 Device Room Air  Pain Assessment  Pain Scale 0-10  Pain Score 0  Post-Hemodialysis Assessment  Rinseback Volume (mL) 250 mL  KECN 72.8 V  Dialyzer Clearance Lightly streaked  Duration of HD Treatment -hour(s) 3.5 hour(s)  Hemodialysis Intake (mL) 500 mL  UF Total -Machine (mL) 3000 mL  Net UF (mL) 2500 mL  Tolerated HD Treatment Yes  AVG/AVF Arterial Site Held (minutes) 10 minutes  AVG/AVF Venous Site Held (minutes) 5 minutes  Fistula / Graft Left Forearm Arteriovenous fistula  Placement Date/Time: 06/09/18 1325   Placed prior to admission: No  Orientation: Left  Access Location: Forearm  Access Type: Arteriovenous fistula  Site Condition No complications  Fistula / Graft Assessment Present;Thrill;Bruit  Status Patent  Drainage Description None

## 2018-10-12 NOTE — Consult Note (Addendum)
ORTHOPAEDIC CONSULTATION   Chief Complaint: Bilateral foot gangrene  HPI: Jose [name] is a 73 y.o. male who complains of severe bilateral lower extremity pain.  Patient has undergone previous toe amputation of his right third toe and left second toe by myself back in February.  He has had delayed wound closure with areas of continued necrosis.  At this time he has necrosis of bilateral feet.  Specifically his right great toe and second toe with necrosis and nonhealing of the third toe amputation site.  Also necrosis of the great toe and third toe on the left foot.  There are areas of discoloration on the dorsal aspect of his foot as well.  He was seen in the outpatient clinic by myself and due to the severe pain and continued infectious nature of the wounds he was sent to the emergency room for evaluation.  At this point he has been admitted.  He is undergoing dialysis currently.  I have spoken to vascular surgery in regards to this patient.  Particularly asked if there was any further intervention that would be needed or could be performed and at this point there is no further intervention that they would offer.  Asked if I would evaluate for possible transmetatarsal amputations to bilateral feet if this would be salvageable.  Past Medical History:  Diagnosis Date  . Chronic kidney disease   . Depression   . Diabetes mellitus without complication ([place])   . Glaucoma   . Glaucoma   . Gout   . Heart murmur   . HLD (hyperlipidemia)   . Hypertension   . Prostate cancer ([place])   . Renal failure    dialysis t/t/s  . Stroke ([place])    tia x 2   Past Surgical History:  Procedure Laterality Date  . AMPUTATION TOE Bilateral 07/23/2018   Procedure: TOE MPJ RIGHT 3RD AND LEFT 2ND;  Surgeon: Samara Deist, DPM;  Location: [facility];  Service: Podiatry;  Laterality: Bilateral;  . APPENDECTOMY    . AV FISTULA PLACEMENT Left 06/09/2018   Procedure: ARTERIOVENOUS (AV) FISTULA CREATION ( BRACHIAL  CEPHALIC);  Surgeon: Algernon Huxley, MD;  Location: [facility];  Service: Vascular;  Laterality: Left;  . CHOLECYSTECTOMY    . DIALYSIS/PERMA CATHETER INSERTION N/A 03/24/2018   Procedure: DIALYSIS/PERMA CATHETER INSERTION;  Surgeon: Katha Cabal, MD;  Location: [place] [facility];  Service: Cardiovascular;  Laterality: N/A;  . DIALYSIS/PERMA CATHETER REMOVAL N/A 09/06/2018   Procedure: DIALYSIS/PERMA CATHETER REMOVAL;  Surgeon: Algernon Huxley, MD;  Location: [place] [facility];  Service: Cardiovascular;  Laterality: N/A;  . INSERTION PROSTATE RADIATION SEED    . LOWER EXTREMITY ANGIOGRAPHY Right 06/16/2018   Procedure: LOWER EXTREMITY ANGIOGRAPHY;  Surgeon: Algernon Huxley, MD;  Location: [place] [facility];  Service: Cardiovascular;  Laterality: Right;  . LOWER EXTREMITY ANGIOGRAPHY Left 08/30/2018   Procedure: LOWER EXTREMITY ANGIOGRAPHY;  Surgeon: Algernon Huxley, MD;  Location: [place] [facility];  Service: Cardiovascular;  Laterality: Left;  . LOWER EXTREMITY ANGIOGRAPHY Right 09/30/2018   Procedure: LOWER EXTREMITY ANGIOGRAPHY;  Surgeon: Algernon Huxley, MD;  Location: [place] [facility];  Service: Cardiovascular;  Laterality: Right;  . LOWER EXTREMITY ANGIOGRAPHY Left 10/07/2018   Procedure: LOWER EXTREMITY ANGIOGRAPHY;  Surgeon: Algernon Huxley, MD;  Location: [place] [facility];  Service: Cardiovascular;  Laterality: Left;   Social History   Socioeconomic History  . Marital status: Widowed    Spouse name: Not on file  .  Number of children: Not on file  . Years of education: Not on file  . Highest education level: Not on file  Occupational History    Employer: RETIRED  Social Needs  . Financial resource strain: Not on file  . Food insecurity:    Worry: Not on file    Inability: Not on file  . Transportation needs:    Medical: Not on file    Non-medical: Not on file  Tobacco Use  . Smoking status: Current Some Day Smoker    Packs/day: 0.10    Types: Cigarettes  . Smokeless  tobacco: Never Used  Substance and Sexual Activity  . Alcohol use: Not Currently    Alcohol/week: 0.0 standard drinks    Frequency: Never  . Drug use: Never  . Sexual activity: Not Currently  Lifestyle  . Physical activity:    Days per week: Not on file    Minutes per session: Not on file  . Stress: Not on file  Relationships  . Social connections:    Talks on phone: Not on file    Gets together: Not on file    Attends religious service: Not on file    Active member of club or organization: Not on file    Attends meetings of clubs or organizations: Not on file    Relationship status: Not on file  Other Topics Concern  . Not on file  Social History Narrative  . Not on file   Family History  Problem Relation Age of Onset  . Varicose Veins Neg Hx    Allergies  Allergen Reactions  . Penicillin G Other (See Comments)    Other reaction(s): Unknown DID THE REACTION INVOLVE: Swelling of the face/tongue/throat, SOB, or low BP? Unknown Sudden or severe rash/hives, skin peeling, or the inside of the mouth or nose? Unknown Did it require medical treatment? Unknown When did it last happen?unknown If all above answers are "NO", may proceed with cephalosporin use.    Prior to Admission medications   Medication Sig Start Date End Date Taking? Authorizing Provider  allopurinol (ZYLOPRIM) 100 MG tablet Take 100 mg by mouth daily.    Yes [provider]  amLODipine (NORVASC) 10 MG tablet Take 10 mg by mouth daily.    Yes [provider]  aspirin EC 81 MG EC tablet Take 1 tablet (81 mg total) by mouth daily. 06/18/18  Yes Dustin Flock, MD  atorvastatin (LIPITOR) 10 MG tablet Take 1 tablet (10 mg total) by mouth daily at 6 PM. 08/30/18  Yes Stegmayer, Joelene Millin A, PA-C  cloNIDine (CATAPRES) 0.2 MG tablet TAKE 1 TABLET BY MOUTH TWICE A DAY Patient taking differently: Take 0.2 mg by mouth 2 (two) times daily.  05/14/18  Yes Dew, Erskine Squibb, MD  clopidogrel (PLAVIX) 75 MG  tablet Take 1 tablet (75 mg total) by mouth daily. 06/18/18  Yes Dustin Flock, MD  doxycycline (VIBRAMYCIN) 100 MG capsule Take 1 capsule (100 mg total) by mouth 2 (two) times daily. 09/27/18  Yes Kris Hartmann, NP  escitalopram (LEXAPRO) 10 MG tablet Take 10 mg by mouth daily.    Yes [provider]  gabapentin (NEURONTIN) 300 MG capsule Take 1 capsule (300 mg total) by mouth 3 (three) times daily. Patient taking differently: Take 100 mg by mouth 3 (three) times daily.  09/27/18 01/25/19 Yes Kris Hartmann, NP  glipiZIDE (GLUCOTROL) 5 MG tablet Take 5 mg by mouth daily before breakfast.    Yes [provider]  hydrALAZINE (APRESOLINE) 25 MG tablet Take 1 tablet (25 mg total) by mouth every 8 (eight) hours. 03/27/18  Yes Wieting, Richard, MD  oxyCODONE-acetaminophen (PERCOCET) 7.5-325 MG tablet Take 1 tablet by mouth every 4 (four) hours as needed for severe pain. 09/06/18  Yes Dew, Erskine Squibb, MD  multivitamin (RENA-VIT) TABS tablet Take 1 tablet by mouth at bedtime. Patient not taking: Reported on 10/11/2018 03/27/18   Loletha Grayer, MD  Nutritional Supplements (FEEDING SUPPLEMENT, NEPRO CARB STEADY,) LIQD Take 237 mLs by mouth 2 (two) times daily between meals. Patient not taking: Reported on 10/11/2018 03/27/18   Loletha Grayer, MD   Dg Foot Complete Left  Result Date: 10/11/2018 CLINICAL DATA:  Diabetic patient with a burning sensation in the left toes. 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 EXAM: LEFT FOOT - COMPLETE 3+ VIEW COMPARISON:  None. FINDINGS: No acute bony or joint abnormality is identified. No bony destructive change or periosteal reaction is seen. The second toe has been amputated. No soft tissue gas or radiopaque foreign body is noted. Soft tissues of the foot are diffusely swollen. IMPRESSION: Diffuse soft tissue swelling without underlying acute bony or joint abnormality. Status post amputation of the second toe. Electronically Signed   By: Inge Rise M.D.   On: 10/11/2018 17:24    Positive ROS: All other systems have been reviewed and were otherwise negative with the exception of those mentioned in the HPI and as above.  12 point ROS was performed.  Physical Exam: General: Alert and oriented.  No apparent distress.  Vascular:  Left foot:Dorsalis Pedis:  absent Posterior Tibial:  absent  Right foot: Dorsalis Pedis:  absent Posterior Tibial:  absent  Neuro:intact  gross sensation to the lower extremities.  Derm: There is gangrenous changes of the great toe and second toe on the right foot.  There is necrosis of the skin at the amputation site of the third toe on the right foot.  There is necrosis and gangrenous changes of the left third toe as well as necrosis of skin at the second toe amputation site.  There is purulent drainage at the distal aspect of the second toe amputation site.  There is darkened discoloration of the dorsal aspect of the foot at the level of the metatarsophalangeal joints.  This extends somewhat proximal to this area as well.  Ortho/MS: Patient has diffuse bilateral lower extremity edema from the ankle distally.  He has exquisite pain with any palpation and pressure to the forefoot region.  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 End stage renal disease Diabetes  Plan: Patient has gangrenous changes of distal aspect of bilateral feet.  I have discussed with vascular surgery and they have performed revascularization to both lower extremities in the past 2 weeks.  At this point there is no further revascularization procedure that they can offer as he has blood flow grossly to the lower extremities with likely small vessel disease and continued gangrenous changes.  I have recommended surgical debridement which will likely consist of digital amputation of all of the digits and likely bilateral transmetatarsal amputations to find the level of perfusion  that can possibly heal to both feet.  I discussed this with the patient in detail and we will try to add this on for surgery tomorrow.  Patient understands he still at high risk of further complications and nonhealing.  We will plan for surgery tomorrow.    Elesa Hacker, DPM Cell [phone]  10/12/2018 11:54 AM

## 2018-10-12 NOTE — TOC Initial Note (Signed)
Transition of Care [facility]) - Initial/Assessment Note    Patient Details  Name: Jose [name] MRN: [id number] Date of Birth: [birth year]  Transition of Care [facility]) CM/SW Contact:    [name], RN Phone Number: 10/12/2018, 10:06 AM  Clinical Narrative:                 [facility] consult for high risk for readmission.  Patient off the floor for HD.  [name] dialysis liaison notified of admission.  Patient is open with home health services through [facility].  [name] with [facility] notified of admission.  Patient is open with Rn, PT, SW.   RNCM to complete full assessment at a later time  Expected Discharge Plan: [place] Barriers to Discharge: Continued Medical Work up   Patient Goals and CMS Choice        Expected Discharge Plan and Services Expected Discharge Plan: [place]   Discharge Planning Services: CM Consult   Living arrangements for the past 2 months: Single Family Home                               Date [place]: 10/12/18 Time HH Agency Contacted: 6 Representative spoke with at [place] Arrangements/Services Living arrangements for the past 2 months: [place] with:: Self              Current home services: Home PT, Home RN, Sitter(home health SW)    Activities of Daily Living Home Assistive Devices/Equipment: CBG Meter, Cane (specify quad or straight) ADL Screening (condition at time of admission) Patient's cognitive ability adequate to safely complete daily activities?: Yes Is the patient deaf or have difficulty hearing?: No Does the patient have difficulty seeing, even when wearing glasses/contacts?: No Does the patient have difficulty concentrating, remembering, or making decisions?: No Patient able to express need for assistance with ADLs?: Yes Does the patient have difficulty dressing or bathing?: No Independently performs ADLs?:  No Communication: Independent Dressing (OT): Needs assistance Is this a change from baseline?: Pre-admission baseline Grooming: Needs assistance Is this a change from baseline?: Pre-admission baseline Feeding: Independent Bathing: Independent Toileting: Independent In/Out Bed: Independent Walks in Home: Independent Does the patient have difficulty walking or climbing stairs?: Yes Weakness of Legs: Both Weakness of Arms/Hands: None  Permission Sought/Granted                  Emotional Assessment              Admission diagnosis:  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 Ischemic foot 10/11/2018  . Atherosclerosis of native arteries of the extremities with gangrene ([place]) 07/09/2018  . Gangrene of toe of right foot ([place]) 06/13/2018  . Diabetes mellitus ([place]) 04/16/2018  . Hypertension 04/16/2018  . ESRD on dialysis ([place]) 04/16/2018  . Malnutrition of moderate degree 03/27/2018  . Renal failure 03/21/2018   PCP:  [name], MD Pharmacy:   [id number] [place], [address] [address] Phone: [phone] Fax: [phone]     Social Determinants of Health (SDOH) Interventions    Readmission Risk Interventions No flowsheet data found.

## 2018-10-12 NOTE — Progress Notes (Signed)
Post HD Assessment   10/12/18 1320  Neurological  Level of Consciousness Alert  Orientation Level Oriented X4  Respiratory  Respiratory Pattern Regular  Chest Assessment Chest expansion symmetrical  Bilateral Breath Sounds Clear;Diminished  Cough None  Cardiac  Pulse Regular  Heart Sounds S1, S2  ECG Monitor No  Cardiac Rhythm Atrial fibrillation  Vascular  R Radial Pulse +2  L Radial Pulse +2  Edema Generalized  Generalized Edema +2  Psychosocial  Psychosocial (WDL) WDL

## 2018-10-12 NOTE — Progress Notes (Signed)
[facility], [place] 10/12/18  Subjective:   LOS: 1 05/11 0701 - 05/12 0700 In: 550 [IV [id number]:580] Out: 125 [Urine:125] Patient known to our practice from outpatient dialysis.  He dialyzes at [facility] kidney center Presents for gangrenous toes and neuropathic pain in both feet Seen during dialysis Tolerating well   HEMODIALYSIS FLOWSHEET:  Blood Flow Rate (mL/min): 400 mL/min Arterial Pressure (mmHg): -180 mmHg Venous Pressure (mmHg): 240 mmHg Transmembrane Pressure (mmHg): 50 mmHg Ultrafiltration Rate (mL/min): 1000 mL/min Dialysate Flow Rate (mL/min): 800 ml/min Conductivity: Machine : 13.8 Conductivity: Machine : 13.8 Dialysis Fluid Bolus: Normal Saline Bolus Amount (mL): 250 mL    Objective:  Vital signs in last 24 hours:  Temp:  [97.8 F (36.6 C)-99.1 F (37.3 C)] 98.5 F (36.9 C) (05/12 1416) Pulse Rate:  [48-124] 72 (05/12 1416) Resp:  [8-20] 17 (05/12 1245) BP: (105-157)/(60-106) 147/84 (05/12 1416) SpO2:  [91 %-100 %] 100 % (05/12 1416) Weight:  [68 kg-68.2 kg] 68.2 kg (05/12 0945)  Weight change:  Filed Weights   10/11/18 1554 10/11/18 1604 10/12/18 0945  Weight: 68 kg 68 kg 68.2 kg    Intake/Output:    Intake/Output Summary (Last 24 hours) at 10/12/2018 1539 Last data filed at 10/12/2018 0620 Gross per 24 hour  Intake 550 ml  Output 125 ml  Net 425 ml     Physical Exam: General:  No acute distress, laying in the bed  HEENT  anicteric, moist oral mucous membranes  Neck  supple, no masses  Pulm/lungs  normal breathing effort, clear to auscultation  CVS/Heart  atrial flutter on telemetry  Abdomen:   Soft, nontender  Extremities:  Gangrenous toes bilaterally  Neurologic:  Alert, oriented  Skin:  No acute rashes  Access:  AV fistula       Basic Metabolic Panel:  Recent Labs  Lab 10/11/18 1626 10/12/18 0428  NA 142 138  K 3.3* 3.6  CL 107 103  CO2 25 23  GLUCOSE 97 116*  BUN 54* 58*  CREATININE  5.97* 6.11*  CALCIUM 8.7* 8.4*     CBC: Recent Labs  Lab 10/11/18 1626 10/12/18 0428  WBC 7.3 6.5  NEUTROABS 4.4  --   HGB 10.0* 9.9*  HCT 30.7* 30.6*  MCV 95.9 95.6  PLT 159 146*      Lab Results  Component Value Date   HEPBSAG Negative 03/22/2018   HEPBSAB Reactive 03/22/2018   HEPBIGM Negative 03/22/2018      Microbiology:  Recent Results (from the past 240 hour(s))  Wound or Superficial Culture     Status: None (Preliminary result)   Collection Time: 10/11/18  4:26 PM  Result Value Ref Range Status   Specimen Description   Final    FOOT LEFT Performed at [facility], [address], [place], [place] [address]    Special Requests   Final    Immunocompromised Performed at [facility], [address] [place], [place] [address]    Gram Stain   Final    FEW WBC PRESENT, PREDOMINANTLY PMN ABUNDANT GRAM POSITIVE COCCI ABUNDANT GRAM NEGATIVE RODS    Culture   Final    CULTURE REINCUBATED FOR BETTER GROWTH Performed at [facility], [place] [address] [place], [place] [address]    Report Status PENDING  Incomplete  SARS Coronavirus 2 (CEPHEID - Performed in [place] hospital lab), Hosp Order     Status: None   Collection Time: 10/11/18  6:24 PM  Result  Value Ref Range Status   SARS Coronavirus 2 NEGATIVE NEGATIVE Final    Comment: (NOTE) If result is NEGATIVE SARS-CoV-2 target nucleic acids are NOT DETECTED. The SARS-CoV-2 RNA is generally detectable in upper and lower  respiratory specimens during the acute phase of infection. The lowest  concentration of SARS-CoV-2 viral copies this assay can detect is 250  copies / mL. A negative result does not preclude SARS-CoV-2 infection  and should not be used as the sole basis for treatment or other  patient management decisions.  A negative result may occur with  improper specimen collection / handling, submission of specimen other  than nasopharyngeal swab, presence of viral  mutation(s) within the  areas targeted by this assay, and inadequate number of viral copies  (<250 copies / mL). A negative result must be combined with clinical  observations, patient history, and epidemiological information. If result is POSITIVE SARS-CoV-2 target nucleic acids are DETECTED. The SARS-CoV-2 RNA is generally detectable in upper and lower  respiratory specimens dur ing the acute phase of infection.  Positive  results are indicative of active infection with SARS-CoV-2.  Clinical  correlation with patient history and other diagnostic information is  necessary to determine patient infection status.  Positive results do  not rule out bacterial infection or co-infection with other viruses. If result is PRESUMPTIVE POSTIVE SARS-CoV-2 nucleic acids MAY BE PRESENT.   A presumptive positive result was obtained on the submitted specimen  and confirmed on repeat testing.  While 2019 novel coronavirus  (SARS-CoV-2) nucleic acids may be present in the submitted sample  additional confirmatory testing may be necessary for epidemiological  and / or clinical management purposes  to differentiate between  SARS-CoV-2 and other Sarbecovirus currently known to infect humans.  If clinically indicated additional testing with an alternate test  methodology [id number]) is advised. The SARS-CoV-2 RNA is generally  detectable in upper and lower respiratory sp ecimens during the acute  phase of infection. The expected result is Negative. Fact Sheet for Patients:  StrictlyIdeas.no Fact Sheet for Healthcare Providers: BankingDealers.co.za This test is not yet approved or cleared by the [place] FDA and has been authorized for detection and/or diagnosis of SARS-CoV-2 by FDA under an Emergency Use Authorization (EUA).  This EUA will remain in effect (meaning this test can be used) for the duration of the COVID-19 declaration under Section 564(b)(1)  of the Act, 21 U.S.C. section 360bbb-3(b)(1), unless the authorization is terminated or revoked sooner. Performed at [facility], [address] [place], [place] [address]     Coagulation Studies: No results for input(s): LABPROT, INR in the last 72 hours.  Urinalysis: No results for input(s): COLORURINE, LABSPEC, PHURINE, GLUCOSEU, HGBUR, BILIRUBINUR, KETONESUR, PROTEINUR, UROBILINOGEN, NITRITE, LEUKOCYTESUR in the last 72 hours.  Invalid input(s): APPERANCEUR    Imaging: Dg Foot Complete Left  Result Date: 10/11/2018 CLINICAL DATA:  Diabetic patient with a burning sensation in the left toes. 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 EXAM: LEFT FOOT - COMPLETE 3+ VIEW COMPARISON:  None. FINDINGS: No acute bony or joint abnormality is identified. No bony destructive change or periosteal reaction is seen. The second toe has been amputated. No soft tissue gas or radiopaque foreign body is noted. Soft tissues of the foot are diffusely swollen. IMPRESSION: Diffuse soft tissue swelling without underlying acute bony or joint abnormality. Status post amputation of the second toe. Electronically Signed   By: Inge Rise M.D.   On: 10/11/2018 17:24     Medications:   .  ceFEPime (MAXIPIME) IV 1 g (10/12/18 0213)  . vancomycin Stopped (10/12/18 1429)   . allopurinol  100 mg Oral Daily  . amLODipine  10 mg Oral Daily  . aspirin EC  81 mg Oral Daily  . atorvastatin  10 mg Oral q1800  . Chlorhexidine Gluconate Cloth  6 each Topical Q0600  . cloNIDine  0.2 mg Oral BID  . clopidogrel  75 mg Oral Daily  . escitalopram  10 mg Oral Daily  . gabapentin  100 mg Oral TID  . heparin  5,000 Units Subcutaneous Q8H  . hydrALAZINE  25 mg Oral Q8H  . insulin aspart  0-5 Units Subcutaneous QHS  . insulin aspart  0-9 Units Subcutaneous TID WC  . multivitamin  1 tablet Oral QHS  . nicotine  21 mg Transdermal Daily  . traZODone  50 mg Oral QHS   docusate sodium,  oxyCODONE-acetaminophen  Assessment/ Plan:  73 y.o. male with end-stage renal disease, depression, diabetes with complications, gout, history of stroke, peripheral vascular disease [address] [facility]/TTS 2/TW 70.5 kg  Active Problems:   Gangrene of toe of right foot (HCC)   Ischemic foot -Dr. Vickki Muff planning bilateral transmetatarsal amputation. -Patient will also be followed by vascular surgery  #.  End-stage renal disease -Seen during dialysis.  Tolerating well. -We will continue TTS schedule  Recent Labs    10/11/18 1626 10/12/18 0428  CREATININE 5.97* 6.11*    #. Anemia of CKD  Lab Results  Component Value Date   HGB 9.9 (L) 10/12/2018  -We will schedule Epogen with hemodialysis  #. SHPTH -Monitor phosphorus during hospital admission -No binders noted on home medication list    Component Value Date/Time   PTH 226 (H) 03/25/2018 1557   Lab Results  Component Value Date   PHOS 6.1 (H) 06/17/2018   PHOS 5.7 (H) 06/17/2018    #. Diabetes type 2 with CKD No results found for: HGBA1C. -Maintain with oral hypoglycemics   LOS: [place] 5/12/20203:39 PM  [place], [place]

## 2018-10-12 NOTE — Progress Notes (Signed)
HD Tx started   10/12/18 0945  Vital Signs  Pulse Rate 63  Pulse Rate Source Monitor  Resp 19  BP 121/72  BP Location Right Arm  BP Method Automatic  Patient Position (if appropriate) Lying  Oxygen Therapy  SpO2 100 %  O2 Device Room Air  Pulse Oximetry Type Continuous  Pain Assessment  Pain Scale 0-10  Pain Score 0  Dialysis Weight  Weight 68.2 kg  Type of Weight Pre-Dialysis  Time-Out for Hemodialysis  What Procedure? HD   Pt Identifiers(min of two) First/Last Name;MRN/Account#  Correct Site? Yes  Correct Side? Yes  Correct Procedure? Yes  Consents Verified? Yes  Rad Studies Available? N/A  Safety Precautions Reviewed? Yes  Engineer, civil (consulting) Number 5  Station Number 1  UF/Alarm Test Passed  Conductivity: Meter 13.8  Conductivity: Machine  13.8  pH 7.2  Reverse Osmosis Main  Normal Saline Lot Number [id number]  Dialyzer Lot Number [id number]  Disposable Set Lot Number [id number]  Machine Temperature 98.6 F (37 C)  Musician and Audible Yes  Blood Lines Intact and Secured Yes  Pre Treatment Patient Checks  Vascular access used during treatment Fistula  Hepatitis B Surface Antigen Results Negative  Date Hepatitis B Surface Antigen Drawn 03/30/18  Hepatitis B Surface Antibody 102  Date Hepatitis B Surface Antibody Drawn 03/30/18  Hemodialysis Consent Verified Yes  Hemodialysis Standing Orders Initiated Yes  ECG (Telemetry) Monitor On Yes  Prime Ordered Normal Saline  Length of  DialysisTreatment -hour(s) 3.5 Hour(s)  Dialysis Treatment Comments Na 140  Dialyzer Elisio 17H NR  Dialysate 3K, 2.5 Ca  Dialysis Anticoagulant Heparin  Dialysate Flow Ordered 800  Blood Flow Rate Ordered 400 mL/min  Ultrafiltration Goal 2.5 Liters  Dialysis Blood Pressure Support Ordered Normal Saline  During Hemodialysis Assessment  Blood Flow Rate (mL/min) 400 mL/min  Arterial Pressure (mmHg) -160 mmHg  Venous Pressure (mmHg) 220 mmHg  Transmembrane Pressure (mmHg) 50  mmHg  Ultrafiltration Rate (mL/min) 1000 mL/min  Dialysate Flow Rate (mL/min) 800 ml/min  Conductivity: Machine  13.8  Dialysis Fluid Bolus Normal Saline  Bolus Amount (mL) 250 mL  Intra-Hemodialysis Comments Tx initiated  Fistula / Graft Left Forearm Arteriovenous fistula  Placement Date/Time: 06/09/18 1325   Placed prior to admission: No  Orientation: Left  Access Location: Forearm  Access Type: Arteriovenous fistula  Status Accessed  Needle Size 15 g  Drainage Description None

## 2018-10-12 NOTE — Progress Notes (Signed)
Patient well-known to our service.  Has continued gangrenous changes to the forefeet bilaterally.  Admitted with some purulent drainage and continued pain. He has had extensive revascularization to both lower extremities and now actually has pretty good runoff from a large vessel standpoint.  Has a markedly reduced ejection fraction and small vessel disease and no further revascularization options are present.  Podiatry is going to see the patient and offer him bilateral transmetatarsal amputations.  If this does not heal, would have to consider below-knee amputations going forward.  Nothing further to offer from a vascular point of view at this time.

## 2018-10-12 NOTE — Progress Notes (Signed)
Dr. Vickki Muff messaged incase he needed to order the patient to be NPO sips with meds after midnight for procedure schedule tomorrow at 1:30pm.

## 2018-10-12 NOTE — Progress Notes (Signed)
[place] at [place] NAME: Jose [name]    MR#:  [id number]  DATE OF BIRTH:  [birth year]  SUBJECTIVE:  CHIEF COMPLAINT:   Chief Complaint  Patient presents with  . Foot Burn    REVIEW OF SYSTEMS:  CONSTITUTIONAL: No fever, fatigue or weakness.  EYES: No blurred or double vision.  EARS, NOSE, AND THROAT: No tinnitus or ear pain.  RESPIRATORY: No cough, shortness of breath, wheezing or hemoptysis.  CARDIOVASCULAR: No chest pain, orthopnea, edema.  GASTROINTESTINAL: No nausea, vomiting, diarrhea or abdominal pain.  GENITOURINARY: No dysuria, hematuria.  ENDOCRINE: No polyuria, nocturia,  HEMATOLOGY: No anemia, easy bruising or bleeding SKIN: No rash or lesion. MUSCULOSKELETAL: No joint pain or arthritis.   NEUROLOGIC: No tingling, numbness, weakness.  PSYCHIATRY: No anxiety or depression.   ROS  DRUG ALLERGIES:   Allergies  Allergen Reactions  . Penicillin G Other (See Comments)    Other reaction(s): Unknown DID THE REACTION INVOLVE: Swelling of the face/tongue/throat, SOB, or low BP? Unknown Sudden or severe rash/hives, skin peeling, or the inside of the mouth or nose? Unknown Did it require medical treatment? Unknown When did it last happen?unknown If all above answers are "NO", may proceed with cephalosporin use.     VITALS:  Blood pressure (!) 157/106, pulse (!) 121, temperature 98.3 F (36.8 C), temperature source Oral, resp. rate 17, height 6\' 1"  (1.854 m), weight 68.2 kg, SpO2 100 %.  PHYSICAL EXAMINATION:  GENERAL:  73 y.o.-year-old patient lying in the bed with no acute distress.  EYES: Pupils equal, round, reactive to light and accommodation. No scleral icterus. Extraocular muscles intact.  HEENT: Head atraumatic, normocephalic. Oropharynx and nasopharynx clear.  NECK:  Supple, no jugular venous distention. No thyroid enlargement, no tenderness.  LUNGS: Normal breath sounds bilaterally, no wheezing, rales,rhonchi  or crepitation. No use of accessory muscles of respiration.  CARDIOVASCULAR: S1, S2 normal. No murmurs, rubs, or gallops.  ABDOMEN: Soft, nontender, nondistended. Bowel sounds present. No organomegaly or mass.  EXTREMITIES: No pedal edema, cyanosis, or clubbing.  NEUROLOGIC: Cranial nerves II through XII are intact. Muscle strength 5/5 in all extremities. Sensation intact. Gait not checked.  PSYCHIATRIC: The patient is alert and oriented x 3.  SKIN: No obvious rash, lesion, or ulcer.   Physical Exam LABORATORY PANEL:   CBC Recent Labs  Lab 10/12/18 0428  WBC 6.5  HGB 9.9*  HCT 30.6*  PLT 146*   ------------------------------------------------------------------------------------------------------------------  Chemistries  Recent Labs  Lab 10/11/18 1626 10/12/18 0428  NA 142 138  K 3.3* 3.6  CL 107 103  CO2 25 23  GLUCOSE 97 116*  BUN 54* 58*  CREATININE 5.97* 6.11*  CALCIUM 8.7* 8.4*  AST 26  --   ALT 19  --   ALKPHOS 46  --   BILITOT 0.5  --    ------------------------------------------------------------------------------------------------------------------  Cardiac Enzymes No results for input(s): TROPONINI in the last 168 hours. ------------------------------------------------------------------------------------------------------------------  RADIOLOGY:  Dg Foot Complete Left  Result Date: 10/11/2018 CLINICAL DATA:  Diabetic patient with a burning sensation in the left toes. 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 EXAM: LEFT FOOT - COMPLETE 3+ VIEW COMPARISON:  None. FINDINGS: No acute bony or joint abnormality is identified. No bony destructive change or periosteal reaction is seen. The second toe has been amputated. No soft tissue gas or radiopaque foreign body is noted. Soft tissues of the foot are diffusely swollen. IMPRESSION: Diffuse soft tissue swelling without underlying acute bony or joint  abnormality. Status post amputation of the second toe.  Electronically Signed   By: Inge Rise M.D.   On: 10/11/2018 17:24    ASSESSMENT AND PLAN:   *Acute bilateral lower extremity dry gangrene For bilateral TMA by podiatry/Dr. Vickki Muff on tomorrow, vascular input appreciated-no further plans for revascularization  *Acute right lower extremity cellulitis, mild Continue empiric cefepime/vancomycin, follow-up on cultures  *ESRD Status post hemodialysis today Nephrology following for hemodialysis needs  *Chronic diabetes mellitus type 2  Continue to hold oral medication, continue sliding scale insulin with Accu-Cheks per routine  *Hypertension Continue home medications.  *Chronic tobacco smoker abuse/dependency  Cessation counseling ordered, nicotine patch daily   *Acute insomnia  Trazodone at bedtime   Disposition pending clinical course  All the records are reviewed and case discussed with Care Management/Social Workerr. Management plans discussed with the patient, family and they are in agreement.  CODE STATUS: full  TOTAL TIME TAKING CARE OF THIS PATIENT: 33 minutes.     POSSIBLE D/C IN 1-3 DAYS, DEPENDING ON CLINICAL CONDITION.   Avel Peace Anjanette Gilkey M.D on 10/12/2018   Between 7am to 6pm - Pager - [phone]  After 6pm go to [email or web address] - password [email or web address] [facility]  Office  [phone]  CC: Primary care physician; [name], MD  Note: This dictation was prepared with Dragon dictation along with smaller phrase technology. Any transcriptional errors that result from this process are unintentional.

## 2018-10-12 NOTE — Progress Notes (Signed)
HD Tx completed, tolerated well, UF goal met   10/12/18 1315  Hand-Off documentation  Report given to (Full Name) Alveria Apley, RN   Report received from (Full Name) Beatris Ship, RN   Vital Signs  Temp 98.4 F (36.9 C)  Temp Source Oral  Pulse Rate 72  Pulse Rate Source Monitor  Resp (!) 24  BP (!) 137/103  BP Location Right Arm  BP Method Automatic  Patient Position (if appropriate) Lying  Oxygen Therapy  SpO2 98 %  O2 Device Room Air  Pain Assessment  Pain Scale 0-10  Pain Score 0  During Hemodialysis Assessment  HD Safety Checks Performed Yes  KECN 72.8 KECN  Dialysis Fluid Bolus Normal Saline  Bolus Amount (mL) 250 mL  Intra-Hemodialysis Comments Tx completed;Tolerated well  Post-Hemodialysis Assessment  Rinseback Volume (mL) 250 mL  KECN 72.8 V  Dialyzer Clearance Lightly streaked  Duration of HD Treatment -hour(s) 3.5 hour(s)  Hemodialysis Intake (mL) 500 mL  UF Total -Machine (mL) 3000 mL  Net UF (mL) 2500 mL  Tolerated HD Treatment Yes  AVG/AVF Arterial Site Held (minutes) 10 minutes  AVG/AVF Venous Site Held (minutes) 5 minutes  Fistula / Graft Left Forearm Arteriovenous fistula  Placement Date/Time: 06/09/18 1325   Placed prior to admission: No  Orientation: Left  Access Location: Forearm  Access Type: Arteriovenous fistula  Site Condition No complications  Fistula / Graft Assessment Present;Thrill;Bruit  Status Deaccessed

## 2018-10-12 NOTE — Anesthesia Preprocedure Evaluation (Addendum)
Anesthesia Evaluation  Patient identified by MRN, date of birth, ID band Patient awake    Reviewed: Allergy & Precautions, NPO status , Patient's Chart, lab work & pertinent test results  History of Anesthesia Complications Negative for: history of anesthetic complications  Airway Mallampati: II  TM Distance: >3 FB Neck ROM: Full    Dental  (+) Poor Dentition, Missing   Pulmonary neg sleep apnea, neg COPD, Current Smoker,    breath sounds clear to auscultation- rhonchi (-) wheezing      Cardiovascular hypertension, Pt. on medications + Peripheral Vascular Disease  (-) CAD, (-) Past MI, (-) Cardiac Stents and (-) CABG + Valvular Problems/Murmurs  Rhythm:Regular Rate:Normal - Systolic murmurs and - Diastolic murmurs    Neuro/Psych PSYCHIATRIC DISORDERS Depression CVA, No Residual Symptoms    GI/Hepatic negative GI ROS, Neg liver ROS,   Endo/Other  diabetes, Oral Hypoglycemic Agents  Renal/GU Dialysis and ESRFRenal disease     Musculoskeletal negative musculoskeletal ROS (+)   Abdominal (+) - obese,   Peds  Hematology negative hematology ROS (+)   Anesthesia Other Findings Past Medical History: No date: Chronic kidney disease No date: Depression No date: Diabetes mellitus without complication (HCC) No date: Glaucoma No date: Glaucoma No date: Gout No date: Heart murmur No date: HLD (hyperlipidemia) No date: Hypertension No date: Prostate cancer (HCC) No date: Renal failure     Comment:  dialysis t/t/s No date: Stroke ([facility])     Comment:  tia x 2   Reproductive/Obstetrics                             Anesthesia Physical  Anesthesia Plan  ASA: IV  Anesthesia Plan: General   Post-op Pain Management:    Induction: Intravenous  PONV Risk Score and Plan: 0 and Ondansetron  Airway Management Planned: LMA  Additional Equipment:   Intra-op Plan:   Post-operative Plan:  Extubation in OR  Informed Consent: I have reviewed the patients History and Physical, chart, labs and discussed the procedure including the risks, benefits and alternatives for the proposed anesthesia with the patient or authorized representative who has indicated his/her understanding and acceptance.     Dental advisory given  Plan Discussed with: CRNA, Anesthesiologist and Surgeon  Anesthesia Plan Comments:         Anesthesia Quick Evaluation

## 2018-10-12 NOTE — Progress Notes (Signed)
Pre HD Tx    10/12/18 0943  Hand-Off documentation  Report given to (Full Name) Beatris Ship, Rn   Report received from (Full Name) Alveria Apley, RN   Vital Signs  Temp 98.3 F (36.8 C)  Temp Source Oral  Pulse Rate 63  Pulse Rate Source Monitor  Resp 19  BP 115/71  BP Location Right Arm  BP Method Automatic  Patient Position (if appropriate) Lying  Oxygen Therapy  SpO2 100 %  O2 Device Room Air  Pulse Oximetry Type Continuous  Education / Care Plan  Dialysis Education Provided Yes  Documented Education in Care Plan Yes  Fistula / Graft Left Forearm Arteriovenous fistula  Placement Date/Time: 06/09/18 1325   Placed prior to admission: No  Orientation: Left  Access Location: Forearm  Access Type: Arteriovenous fistula  Site Condition No complications  Fistula / Graft Assessment Present;Thrill;Bruit  Status Patent  Drainage Description None

## 2018-10-13 ENCOUNTER — Other Ambulatory Visit: Payer: Self-pay

## 2018-10-13 ENCOUNTER — Encounter
Admission: EM | Disposition: A | Discharge status: Skilled Nursing Facility | Payer: Self-pay | Source: Home / Self Care | Attending: Family Medicine

## 2018-10-13 ENCOUNTER — Inpatient Hospital Stay: Payer: Medicare Other | Admitting: Anesthesiology

## 2018-10-13 HISTORY — PX: TRANSMETATARSAL AMPUTATION: SHX6197

## 2018-10-13 LAB — GLUCOSE, CAPILLARY
Glucose-Capillary: 148 mg/dL — ABNORMAL HIGH (ref 70–99)
Glucose-Capillary: 69 mg/dL — ABNORMAL LOW (ref 70–99)
Glucose-Capillary: 79 mg/dL (ref 70–99)
Glucose-Capillary: 75 mg/dL (ref 70–99)
Glucose-Capillary: 78 mg/dL (ref 70–99)
Glucose-Capillary: 74 mg/dL (ref 70–99)
Glucose-Capillary: 134 mg/dL — ABNORMAL HIGH (ref 70–99)

## 2018-10-13 LAB — BASIC METABOLIC PANEL
Anion gap: 11 (ref 5–15)
BUN: 40 mg/dL — ABNORMAL HIGH (ref 8–23)
CO2: 26 mmol/L (ref 22–32)
Calcium: 8.4 mg/dL — ABNORMAL LOW (ref 8.9–10.3)
Chloride: 99 mmol/L (ref 98–111)
Creatinine, Ser: 5.07 mg/dL — ABNORMAL HIGH (ref 0.61–1.24)
GFR calc Af Amer: 12 mL/min — ABNORMAL LOW (ref >60)
GFR calc non Af Amer: 11 mL/min — ABNORMAL LOW (ref >60)
Glucose, Bld: 87 mg/dL (ref 70–99)
Potassium: 4 mmol/L (ref 3.5–5.1)
Sodium: 136 mmol/L (ref 135–145)

## 2018-10-13 LAB — SURGICAL PCR SCREEN
MRSA, PCR: NEGATIVE
Staphylococcus aureus: NEGATIVE

## 2018-10-13 SURGERY — AMPUTATION, FOOT, TRANSMETATARSAL
Anesthesia: General | Laterality: Bilateral

## 2018-10-13 MED ORDER — SODIUM CHLORIDE FLUSH 0.9 % IV SOLN
INTRAVENOUS | Status: AC
  Filled 2018-10-13: qty 10

## 2018-10-13 MED ORDER — LIDOCAINE HCL (PF) 1 % IJ SOLN
INTRAMUSCULAR | Status: AC
  Filled 2018-10-13: qty 30

## 2018-10-13 MED ORDER — FENTANYL CITRATE (PF) 100 MCG/2ML IJ SOLN
INTRAMUSCULAR | Status: AC
  Filled 2018-10-13: qty 2

## 2018-10-13 MED ORDER — SODIUM CHLORIDE 0.9 % IV SOLN
INTRAVENOUS | Status: DC | PRN
  Administered 2018-10-13: 14:00:00 via INTRAVENOUS

## 2018-10-13 MED ORDER — THROMBIN 5000 UNITS EX SOLR: CUTANEOUS | Status: DC | PRN

## 2018-10-13 MED ORDER — POVIDONE-IODINE 10 % EX SWAB: 2.0000 "application " | Freq: Once | CUTANEOUS | Status: DC

## 2018-10-13 MED ORDER — CHLORHEXIDINE GLUCONATE 4 % EX LIQD: 60.0000 mL | Freq: Once | CUTANEOUS | Status: DC

## 2018-10-13 MED ORDER — PROPOFOL 10 MG/ML IV BOLUS
INTRAVENOUS | Status: DC | PRN
  Administered 2018-10-13: 100 mg via INTRAVENOUS

## 2018-10-13 MED ORDER — THROMBIN 5000 UNITS EX SOLR
CUTANEOUS | Status: DC | PRN
  Administered 2018-10-13: 5000 [IU] via TOPICAL

## 2018-10-13 MED ORDER — ONDANSETRON HCL 4 MG/2ML IJ SOLN
INTRAMUSCULAR | Status: DC | PRN
  Administered 2018-10-13: 4 mg via INTRAVENOUS

## 2018-10-13 MED ORDER — LIDOCAINE HCL (PF) 2 % IJ SOLN
INTRAMUSCULAR | Status: AC
  Filled 2018-10-13: qty 10

## 2018-10-13 MED ORDER — BUPIVACAINE HCL (PF) 0.25 % IJ SOLN
INTRAMUSCULAR | Status: AC
  Filled 2018-10-13: qty 30

## 2018-10-13 MED ORDER — PHENYLEPHRINE HCL (PRESSORS) 10 MG/ML IV SOLN
INTRAVENOUS | Status: DC | PRN
  Administered 2018-10-13: 100 ug via INTRAVENOUS
  Administered 2018-10-13 (×4): 200 ug via INTRAVENOUS
  Administered 2018-10-13: 100 ug via INTRAVENOUS

## 2018-10-13 MED ORDER — BUPIVACAINE HCL (PF) 0.25 % IJ SOLN
INTRAMUSCULAR | Status: DC | PRN
  Administered 2018-10-13: 8 mL
  Administered 2018-10-13: 17 mL

## 2018-10-13 MED ORDER — LIDOCAINE HCL (CARDIAC) PF 100 MG/5ML IV SOSY
PREFILLED_SYRINGE | INTRAVENOUS | Status: DC | PRN
  Administered 2018-10-13: 80 mg via INTRAVENOUS

## 2018-10-13 MED ORDER — BUPIVACAINE HCL (PF) 0.5 % IJ SOLN
INTRAMUSCULAR | Status: AC
  Filled 2018-10-13: qty 30

## 2018-10-13 MED ORDER — PROPOFOL 10 MG/ML IV BOLUS
INTRAVENOUS | Status: AC
  Filled 2018-10-13: qty 20

## 2018-10-13 MED ORDER — DEXTROSE 50 % IV SOLN
12.5000 g | Freq: Once | INTRAVENOUS | Status: AC
  Administered 2018-10-13: 12.5 g via INTRAVENOUS
  Filled 2018-10-13: qty 50

## 2018-10-13 MED ORDER — FENTANYL CITRATE (PF) 100 MCG/2ML IJ SOLN
INTRAMUSCULAR | Status: DC | PRN
  Administered 2018-10-13: 50 ug via INTRAVENOUS

## 2018-10-13 MED ORDER — FENTANYL CITRATE (PF) 100 MCG/2ML IJ SOLN: 25.0000 ug | INTRAMUSCULAR | Status: DC | PRN

## 2018-10-13 MED ORDER — THROMBIN 5000 UNITS EX SOLR
CUTANEOUS | Status: AC
  Filled 2018-10-13: qty 5000

## 2018-10-13 MED ORDER — BUPIVACAINE LIPOSOME 1.3 % IJ SUSP
INTRAMUSCULAR | Status: AC
  Filled 2018-10-13: qty 20

## 2018-10-13 MED ORDER — BUPIVACAINE LIPOSOME 1.3 % IJ SUSP
INTRAMUSCULAR | Status: DC | PRN
  Administered 2018-10-13: 7.5 mL
  Administered 2018-10-13: 12.5 mL

## 2018-10-13 SURGICAL SUPPLY — 46 items
BANDAGE ELASTIC 4 LF NS (GAUZE/BANDAGES/DRESSINGS) ×2 IMPLANT
BLADE OSC/SAGITTAL MD 9X18.5 (BLADE) IMPLANT
BLADE OSCILLATING/SAGITTAL (BLADE) ×1
BLADE SW THK.38XMED LNG THN (BLADE) ×1 IMPLANT
BNDG COHESIVE 4X5 TAN STRL (GAUZE/BANDAGES/DRESSINGS) ×2 IMPLANT
BNDG ESMARK 4X12 TAN STRL LF (GAUZE/BANDAGES/DRESSINGS) ×2 IMPLANT
BNDG GAUZE 4.5X4.1 6PLY STRL (MISCELLANEOUS) ×4 IMPLANT
CANISTER SUCT 1200ML W/VALVE (MISCELLANEOUS) ×2 IMPLANT
COVER WAND RF STERILE (DRAPES) ×2 IMPLANT
CUFF TOURN SGL QUICK 12 (TOURNIQUET CUFF) IMPLANT
CUFF TOURN SGL QUICK 18X4 (TOURNIQUET CUFF) IMPLANT
DRAIN PENROSE 1/4X12 LTX (DRAIN) ×2 IMPLANT
DRAPE FLUOR MINI C-ARM 54X84 (DRAPES) ×2 IMPLANT
DRESSING ALLEVYN 4X4 (MISCELLANEOUS) IMPLANT
DURAPREP 26ML APPLICATOR (WOUND CARE) ×2 IMPLANT
ELECT REM PT RETURN 9FT ADLT (ELECTROSURGICAL) ×2
ELECTRODE REM PT RTRN 9FT ADLT (ELECTROSURGICAL) ×1 IMPLANT
GAUZE SPONGE 4X4 12PLY STRL (GAUZE/BANDAGES/DRESSINGS) ×4 IMPLANT
GAUZE XEROFORM 1X8 LF (GAUZE/BANDAGES/DRESSINGS) ×4 IMPLANT
GLOVE BIO SURGEON STRL SZ7.5 (GLOVE) ×2 IMPLANT
GLOVE INDICATOR 8.0 STRL GRN (GLOVE) ×2 IMPLANT
GOWN STRL REUS W/ TWL LRG LVL3 (GOWN DISPOSABLE) ×2 IMPLANT
GOWN STRL REUS W/TWL LRG LVL3 (GOWN DISPOSABLE) ×2
HANDLE YANKAUER SUCT BULB TIP (MISCELLANEOUS) ×2 IMPLANT
HANDPIECE VERSAJET DEBRIDEMENT (MISCELLANEOUS) IMPLANT
KIT TURNOVER KIT A (KITS) ×2 IMPLANT
LABEL OR SOLS (LABEL) ×2 IMPLANT
NDL SAFETY ECLIPSE 18X1.5 (NEEDLE) ×1 IMPLANT
NEEDLE HYPO 18GX1.5 SHARP (NEEDLE) ×1
NS IRRIG 500ML POUR BTL (IV SOLUTION) ×2 IMPLANT
PACK EXTREMITY ARMC (MISCELLANEOUS) ×2 IMPLANT
PAD ABD DERMACEA PRESS 5X9 (GAUZE/BANDAGES/DRESSINGS) ×4 IMPLANT
SHIELD FULL FACE ANTIFOG 7M (MISCELLANEOUS) ×2 IMPLANT
SOL PREP PVP 2OZ (MISCELLANEOUS)
SOLUTION PREP PVP 2OZ (MISCELLANEOUS) IMPLANT
SPOGE SURGIFLO 8M (HEMOSTASIS) ×1
SPONGE LAP 18X18 RF (DISPOSABLE) ×2 IMPLANT
SPONGE SURGIFLO 8M (HEMOSTASIS) ×1 IMPLANT
STOCKINETTE M/LG 89821 (MISCELLANEOUS) ×4 IMPLANT
SUT ETHILON 2 0 FS 18 (SUTURE) ×6 IMPLANT
SUT ETHILON 2 0 FSLX (SUTURE) ×2 IMPLANT
SUT ETHILON 3-0 FS-10 30 BLK (SUTURE) ×4
SUT VIC AB 2-0 CT1 27 (SUTURE) ×1
SUT VIC AB 2-0 CT1 TAPERPNT 27 (SUTURE) ×1 IMPLANT
SUTURE EHLN 3-0 FS-10 30 BLK (SUTURE) ×2 IMPLANT
SYR 10ML LL (SYRINGE) ×2 IMPLANT

## 2018-10-13 NOTE — Progress Notes (Signed)
Patient is established with [facility] 10:45.

## 2018-10-13 NOTE — Progress Notes (Addendum)
[facility] [place] NAME: Jose [name]    MR#:  [id number]  DATE OF BIRTH:  [birth year]  SUBJECTIVE:  Patient without complaint, for bilateral lower extremity transmetatarsal amputation by podiatry REVIEW OF SYSTEMS:  CONSTITUTIONAL: No fever, fatigue or weakness.  EYES: No blurred or double vision.  EARS, NOSE, AND THROAT: No tinnitus or ear pain.  RESPIRATORY: No cough, shortness of breath, wheezing or hemoptysis.  CARDIOVASCULAR: No chest pain, orthopnea, edema.  GASTROINTESTINAL: No nausea, vomiting, diarrhea or abdominal pain.  GENITOURINARY: No dysuria, hematuria.  ENDOCRINE: No polyuria, nocturia,  HEMATOLOGY: No anemia, easy bruising or bleeding SKIN: No rash or lesion. MUSCULOSKELETAL: No joint pain or arthritis.   NEUROLOGIC: No tingling, numbness, weakness.  PSYCHIATRY: No anxiety or depression.   ROS  DRUG ALLERGIES:   Allergies  Allergen Reactions  . Penicillin G Other (See Comments)    Other reaction(s): Unknown DID THE REACTION INVOLVE: Swelling of the face/tongue/throat, SOB, or low BP? Unknown Sudden or severe rash/hives, skin peeling, or the inside of the mouth or nose? Unknown Did it require medical treatment? Unknown When did it last happen?unknown If all above answers are "NO", may proceed with cephalosporin use.     VITALS:  Blood pressure 135/77, pulse 82, temperature 98.4 F (36.9 C), temperature source Oral, resp. rate 17, height 6\' 1"  (1.854 m), weight 68.2 kg, SpO2 93 %.  PHYSICAL EXAMINATION:  GENERAL:  73 y.o.-year-old patient lying in the bed with no acute distress.  EYES: Pupils equal, round, reactive to light and accommodation. No scleral icterus. Extraocular muscles intact.  HEENT: Head atraumatic, normocephalic. Oropharynx and nasopharynx clear.  NECK:  Supple, no jugular venous distention. No thyroid enlargement, no tenderness.  LUNGS: Normal breath sounds bilaterally, no wheezing,  rales,rhonchi or crepitation. No use of accessory muscles of respiration.  CARDIOVASCULAR: S1, S2 normal. No murmurs, rubs, or gallops.  ABDOMEN: Soft, nontender, nondistended. Bowel sounds present. No organomegaly or mass.  EXTREMITIES: No pedal edema, cyanosis, or clubbing.  NEUROLOGIC: Cranial nerves II through XII are intact. Muscle strength 5/5 in all extremities. Sensation intact. Gait not checked.  PSYCHIATRIC: The patient is alert and oriented x 3.  SKIN: No obvious rash, lesion, or ulcer.   Physical Exam LABORATORY PANEL:   CBC Recent Labs  Lab 10/12/18 0428  WBC 6.5  HGB 9.9*  HCT 30.6*  PLT 146*   ------------------------------------------------------------------------------------------------------------------  Chemistries  Recent Labs  Lab 10/11/18 1626 10/12/18 0428  NA 142 138  K 3.3* 3.6  CL 107 103  CO2 25 23  GLUCOSE 97 116*  BUN 54* 58*  CREATININE 5.97* 6.11*  CALCIUM 8.7* 8.4*  AST 26  --   ALT 19  --   ALKPHOS 46  --   BILITOT 0.5  --    ------------------------------------------------------------------------------------------------------------------  Cardiac Enzymes No results for input(s): TROPONINI in the last 168 hours. ------------------------------------------------------------------------------------------------------------------  RADIOLOGY:  Dg Foot Complete Left  Result Date: 10/11/2018 CLINICAL DATA:  Diabetic patient with a burning sensation in the left toes. 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 EXAM: LEFT FOOT - COMPLETE 3+ VIEW COMPARISON:  None. FINDINGS: No acute bony or joint abnormality is identified. No bony destructive change or periosteal reaction is seen. The second toe has been amputated. No soft tissue gas or radiopaque foreign body is noted. Soft tissues of the foot are diffusely swollen. IMPRESSION: Diffuse soft tissue swelling without underlying acute bony or joint abnormality. Status post amputation of the second  toe. Electronically Signed   By: Inge Rise M.D.   On: 10/11/2018 17:24    ASSESSMENT AND PLAN:  *Acute bilateral lower extremity dry gangrene For bilateral TMA by podiatry/Dr. Vickki Muff later today, vascular input appreciated-no further plans for revascularization  *Acute right lower extremity cellulitis, mild Resolving Continue empiric cefepime/vancomycin for now, follow-up on cultures  *ESRD Stable Nephrology following for hemodialysis needs  *Chronic diabetes mellitus type 2  Continue to hold oral medication, continue SSI w/ Accu-Cheks per routine  *Hypertension Continue home medications  *Chronic tobacco smoker abuse/dependency  Cessation counseling ordered, nicotine patch daily   *Acute insomnia  Resolved Trazodone at bedtime   Disposition pending clinical course  All the records are reviewed and case discussed with Care Management/Social Workerr. Management plans discussed with the patient, family and they are in agreement.  CODE STATUS: full  TOTAL TIME TAKING CARE OF THIS PATIENT: 33 minutes.     POSSIBLE D/C IN 1-3 DAYS, DEPENDING ON CLINICAL CONDITION.   Avel Peace Salary M.D on 10/13/2018   Between 7am to 6pm - Pager - [phone]  After 6pm go to [email or web address] - password [email or web address] [facility]  Office  [phone]  CC: Primary care physician; [name], MD  Note: This dictation was prepared with Dragon dictation along with smaller phrase technology. Any transcriptional errors that result from this process are unintentional.

## 2018-10-13 NOTE — Anesthesia Post-op Follow-up Note (Signed)
Anesthesia QCDR form completed.        

## 2018-10-13 NOTE — Progress Notes (Signed)
Hypoglycemic Event  CBG: 69  Treatment: D50 25 mL (12.5 gm)  Symptoms: None  Follow-up CBG: MMNO:1771 CBG Result:148  Possible Reasons for Event: Inadequate meal intake  Comments/MD notified: MD notified    Jose [name] [facility]

## 2018-10-13 NOTE — Progress Notes (Signed)
[facility], [place] 10/13/18  Subjective:   LOS: 2 05/12 0701 - 05/13 0700 In: 250 [IV Piggyback:250] Out: 6578 [Urine:150] Complains of pain and numbness in his toes Scheduled for surgery this afternoon  Objective:  Vital signs in last 24 hours:  Temp:  [97.8 F (36.6 C)-98.6 F (37 C)] 98.4 F (36.9 C) (05/13 0800) Pulse Rate:  [57-124] 82 (05/13 0800) Resp:  [8-24] 17 (05/13 0800) BP: (113-157)/(61-106) 135/77 (05/13 0800) SpO2:  [91 %-100 %] 93 % (05/13 0800)  Weight change: 0.16 kg Filed Weights   10/11/18 1554 10/11/18 1604 10/12/18 0945  Weight: 68 kg 68 kg 68.2 kg    Intake/Output:    Intake/Output Summary (Last 24 hours) at 10/13/2018 1114 Last data filed at 10/12/2018 1800 Gross per 24 hour  Intake 250 ml  Output 5150 ml  Net -4900 ml     Physical Exam: General:  No acute distress, laying in the bed  HEENT  anicteric, moist oral mucous membranes  Neck  supple, no masses  Pulm/lungs  normal breathing effort, clear to auscultation  CVS/Heart  irregular  Abdomen:   Soft, nontender  Extremities:  Gangrenous toes bilaterally  Neurologic:  Alert, oriented  Skin:  No acute rashes  Access:  AV fistula       Basic Metabolic Panel:  Recent Labs  Lab 10/11/18 1626 10/12/18 0428  NA 142 138  K 3.3* 3.6  CL 107 103  CO2 25 23  GLUCOSE 97 116*  BUN 54* 58*  CREATININE 5.97* 6.11*  CALCIUM 8.7* 8.4*     CBC: Recent Labs  Lab 10/11/18 1626 10/12/18 0428  WBC 7.3 6.5  NEUTROABS 4.4  --   HGB 10.0* 9.9*  HCT 30.7* 30.6*  MCV 95.9 95.6  PLT 159 146*      Lab Results  Component Value Date   HEPBSAG Negative 03/22/2018   HEPBSAB Reactive 03/22/2018   HEPBIGM Negative 03/22/2018      Microbiology:  Recent Results (from the past 240 hour(s))  Wound or Superficial Culture     Status: None (Preliminary result)   Collection Time: 10/11/18  4:26 PM  Result Value Ref Range Status   Specimen Description    Final    FOOT LEFT Performed at [facility], [address], [place], [place] [address]    Special Requests   Final    Immunocompromised Performed at [facility], [address] [place], [place] [address]    Gram Stain   Final    FEW WBC PRESENT, PREDOMINANTLY PMN ABUNDANT GRAM POSITIVE COCCI ABUNDANT GRAM NEGATIVE RODS    Culture   Final    CULTURE REINCUBATED FOR BETTER GROWTH Performed at [facility], [place] [address] [place], [place] [address]    Report Status PENDING  Incomplete  SARS Coronavirus 2 (CEPHEID - Performed in [place] hospital lab), Hosp Order     Status: None   Collection Time: 10/11/18  6:24 PM  Result Value Ref Range Status   SARS Coronavirus 2 NEGATIVE NEGATIVE Final    Comment: (NOTE) If result is NEGATIVE SARS-CoV-2 target nucleic acids are NOT DETECTED. The SARS-CoV-2 RNA is generally detectable in upper and lower  respiratory specimens during the acute phase of infection. The lowest  concentration of SARS-CoV-2 viral copies this assay can detect is 250  copies / mL. A negative result does not preclude SARS-CoV-2 infection  and should not be used as the sole basis for treatment  or other  patient management decisions.  A negative result may occur with  improper specimen collection / handling, submission of specimen other  than nasopharyngeal swab, presence of viral mutation(s) within the  areas targeted by this assay, and inadequate number of viral copies  (<250 copies / mL). A negative result must be combined with clinical  observations, patient history, and epidemiological information. If result is POSITIVE SARS-CoV-2 target nucleic acids are DETECTED. The SARS-CoV-2 RNA is generally detectable in upper and lower  respiratory specimens dur ing the acute phase of infection.  Positive  results are indicative of active infection with SARS-CoV-2.  Clinical  correlation with patient history and other diagnostic information  is  necessary to determine patient infection status.  Positive results do  not rule out bacterial infection or co-infection with other viruses. If result is PRESUMPTIVE POSTIVE SARS-CoV-2 nucleic acids MAY BE PRESENT.   A presumptive positive result was obtained on the submitted specimen  and confirmed on repeat testing.  While 2019 novel coronavirus  (SARS-CoV-2) nucleic acids may be present in the submitted sample  additional confirmatory testing may be necessary for epidemiological  and / or clinical management purposes  to differentiate between  SARS-CoV-2 and other Sarbecovirus currently known to infect humans.  If clinically indicated additional testing with an alternate test  methodology [id number]) is advised. The SARS-CoV-2 RNA is generally  detectable in upper and lower respiratory sp ecimens during the acute  phase of infection. The expected result is Negative. Fact Sheet for Patients:  StrictlyIdeas.no Fact Sheet for Healthcare Providers: BankingDealers.co.za This test is not yet approved or cleared by the [place] FDA and has been authorized for detection and/or diagnosis of SARS-CoV-2 by FDA under an Emergency Use Authorization (EUA).  This EUA will remain in effect (meaning this test can be used) for the duration of the COVID-19 declaration under Section 564(b)(1) of the Act, 21 U.S.C. section 360bbb-3(b)(1), unless the authorization is terminated or revoked sooner. Performed at [facility], [address] [place], [place] [address]     Coagulation Studies: No results for input(s): LABPROT, INR in the last 72 hours.  Urinalysis: No results for input(s): COLORURINE, LABSPEC, PHURINE, GLUCOSEU, HGBUR, BILIRUBINUR, KETONESUR, PROTEINUR, UROBILINOGEN, NITRITE, LEUKOCYTESUR in the last 72 hours.  Invalid input(s): APPERANCEUR    Imaging: Dg Foot Complete Left  Result Date: 10/11/2018 CLINICAL DATA:   Diabetic patient with a burning sensation in the left toes. 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 EXAM: LEFT FOOT - COMPLETE 3+ VIEW COMPARISON:  None. FINDINGS: No acute bony or joint abnormality is identified. No bony destructive change or periosteal reaction is seen. The second toe has been amputated. No soft tissue gas or radiopaque foreign body is noted. Soft tissues of the foot are diffusely swollen. IMPRESSION: Diffuse soft tissue swelling without underlying acute bony or joint abnormality. Status post amputation of the second toe. Electronically Signed   By: Inge Rise M.D.   On: 10/11/2018 17:24     Medications:   . ceFEPime (MAXIPIME) IV 1 g (10/12/18 2238)  . vancomycin Stopped (10/12/18 1429)   . allopurinol  100 mg Oral Daily  . amLODipine  10 mg Oral Daily  . aspirin EC  81 mg Oral Daily  . atorvastatin  10 mg Oral q1800  . Chlorhexidine Gluconate Cloth  6 each Topical Q0600  . cloNIDine  0.2 mg Oral BID  . clopidogrel  75 mg Oral Daily  . [START ON 10/14/2018] epoetin (EPOGEN/PROCRIT) injection  4,000 Units Intravenous Q T,Th,Sa-HD  . escitalopram  10 mg Oral Daily  . gabapentin  100 mg Oral TID  . heparin  5,000 Units Subcutaneous Q8H  . hydrALAZINE  25 mg Oral Q8H  . insulin aspart  0-5 Units Subcutaneous QHS  . insulin aspart  0-9 Units Subcutaneous TID WC  . multivitamin  1 tablet Oral QHS  . nicotine  21 mg Transdermal Daily  . traZODone  50 mg Oral QHS   docusate sodium, oxyCODONE-acetaminophen  Assessment/ Plan:  73 y.o. male with end-stage renal disease, depression, diabetes with complications, gout, history of stroke, peripheral vascular disease [address] [facility]/TTS 2/TW 70.5 kg  Active Problems:   Gangrene of toe of right foot (HCC)   Ischemic foot -Dr. Vickki Muff planning bilateral transmetatarsal amputation. -Patient will also be followed by vascular surgery  #.  End-stage renal disease -We will continue TTS schedule  Recent Labs     10/11/18 1626 10/12/18 0428  CREATININE 5.97* 6.11*    #. Anemia of CKD  Lab Results  Component Value Date   HGB 9.9 (L) 10/12/2018  - schedule Epogen with hemodialysis  #. SHPTH -Monitor phosphorus during hospital admission -No binders noted on home medication list    Component Value Date/Time   PTH 226 (H) 03/25/2018 1557   Lab Results  Component Value Date   PHOS 6.1 (H) 06/17/2018   PHOS 5.7 (H) 06/17/2018    #. Diabetes type 2 with CKD No results found for: HGBA1C. -Maintained with oral hypoglycemics   LOS: [place] 5/13/202011:14 AM  [place], [place]

## 2018-10-13 NOTE — Anesthesia Postprocedure Evaluation (Signed)
Anesthesia Post Note  Patient: Jose [name]  Procedure(s) Performed: 1.  Amputation right great toe MTPJ 2.  Amputation right second toe MTPJ 3.  Amputation right fourth toe MTPJ 4.  Amputation right fifth toe MTPJ 5.  Excision distal second metatarsal head right foot 6.  Excision distal third metatarsal head right foot 7.  Amputation left third toe 8.  Incision and drainage infectionleft 2nd toe amputation site.  (Bilateral )  Patient location during evaluation: PACU Anesthesia Type: General Level of consciousness: awake and alert Pain management: pain level controlled Vital Signs Assessment: post-procedure vital signs reviewed and stable Respiratory status: spontaneous breathing, nonlabored ventilation, respiratory function stable and patient connected to nasal cannula oxygen Cardiovascular status: blood pressure returned to baseline and stable Postop Assessment: no apparent nausea or vomiting Anesthetic complications: no     Last Vitals:  Vitals:   10/13/18 1640 10/13/18 1802  BP: 121/71 119/78  Pulse: (!) 56 75  Resp: 10 16  Temp: 36.5 C (!) 36.2 C  SpO2: 100% 98%    Last Pain:  Vitals:   10/13/18 1802  TempSrc: Axillary  PainSc:                  [name] S

## 2018-10-13 NOTE — Transfer of Care (Signed)
Immediate Anesthesia Transfer of Care Note  Patient: Jose [name]  Procedure(s) Performed: Procedure(s): TRANSMETATARSAL AMPUTATION BILATERAL - DIABETIC (Bilateral)  Patient Location: PACU  Anesthesia Type:General  Level of Consciousness: sedated  Airway & Oxygen Therapy: Patient Spontanous Breathing and Patient connected to face mask oxygen  Post-op Assessment: Report given to RN and Post -op Vital signs reviewed and stable  Post vital signs: Reviewed and stable  Last Vitals:  Vitals:   10/13/18 1324 10/13/18 1542  BP: (!) 102/55 113/72  Pulse: (!) 52 72  Resp: 18 (!) 8  Temp: (!) 36.4 C 36.9 C  SpO2: 962% 229%    Complications: No apparent anesthesia complications

## 2018-10-13 NOTE — Op Note (Addendum)
Operative note   Surgeon:Clay Menser Lawyer: None    Preop diagnosis: 1.  Gangrene right great toe, second toe, fourth toe, fifth toe, 2.  Infection right third toe amputation site 4.  Gangrene left third toe 5.  Infection 2nd toe amputation site    Postop diagnosis: Same    Procedure: 1.  Amputation right great toe MTPJ 2.  Amputation right second toe MTPJ 3.  Amputation right fourth toe MTPJ 4.  Amputation right fifth toe MTPJ 5.  Excision distal second metatarsal head right foot 6.  Excision distal third metatarsal head right foot 7.  Amputation left third toe 8.  Incision and drainage infectionleft 2nd toe amputation site    EBL: Minimal    Anesthesia:local and general.  Local consisted of a one-to-one mixture of 0.25% bupivacaine and Exparel long-acting anesthetic.  A total of 15 cc was used on the left side and 25 cc on the right side    Hemostasis: None    Specimen: Culture left foot, amputated left second toe, amputated right great toe and second toe.    Complications: None    Operative indications:Jose [name] is an 73 y.o. that presents today for surgical intervention.  The risks/benefits/alternatives/complications have been discussed and consent has been given.    Procedure:  Patient was brought into the OR and placed on the operating table in thesupine position. After anesthesia was obtained the bilateral lower extremity was prepped and draped in usual sterile fashion.  Attention initially started on the left foot where the previous amputation of the second toe was noted to have necrotic tissue with purulent drainage.  The third toe was also noted to be gangrenous.  I felt the remaining tissue to the surrounding area was intact.  This seemed to have improved upon admission to the hospital as the area of necrosis seem to be stabilized at this time.  This time selective amputation of the left third toe at the MTPJ was performed.  Full-thickness incision was placed  dorsal and plantar.  This was taken down to the joint and excised.  The necrotic tissue surrounding the second toe joint amputation site was also excised full-thickness down to healthy bleeding tissue.  Culture was performed at this time.  The metatarsal heads were noted and evaluated with good healthy appearance grossly.  This time no further infected tissue was noted.  The wounds were flushed with copious amounts of irrigation.  Small amount of bleeding was noted at the time and a few bleeders were Bovie cauterized as appropriate.  The area was infiltrated with Surgi-Flo with thrombin.  Primary closure was then performed with a 2-0 nylon.  Attention was then directed to the right foot where there was noted to be severe necrotic changes of the great toe and second toe.  The previous amputation site at the third MTPJ had also dehisced open with surrounding necrotic tissue.  Initially a full-thickness incision was made circumferential around the base of the great toe second toe and third toe amputation site.  Full-thickness incision was taken down to the joint and the great toe and second toe was disarticulated.  At this time there was necrotic tissue that abutted against the fourth toe and with an inability to close the wound.  I elected to amputate the fourth and fifth toes to be able to achieve primary closure.  The fourth and fifth toes were then amputated at the metatarsophalangeal joints with skin flaps left dorsal and plantar for  primary closure.  The wound was flushed with copious amounts of irrigation.  Elongation of the second and third metatarsal heads relative to the surrounding metatarsal heads were noted and at this time I elected to excise these to help assist with primary closure and give a good metatarsal parabola.  Distal aspect of the second and third metatarsal heads were excised with a power saw to bleeding healthy bone.  Final flush of the wound was performed in the wound was then closed with  a 2-0 nylon.  Good primary closure was noted.  A bulky sterile bandage was applied after infiltration of all areas with 0.25% bupivacaine and Exparel long-acting anesthetic.  Patient tolerated the procedure and anesthesia well.  He will be readmitted to the floor postoperatively.    Patient tolerated the procedure and anesthesia well.  Was transported from the OR to the PACU with all vital signs stable and vascular status intact. To be discharged per routine protocol.  Will follow up in approximately 1 week in the outpatient clinic.

## 2018-10-13 NOTE — Anesthesia Procedure Notes (Signed)
Procedure Name: LMA Insertion Date/Time: 10/13/2018 2:17 PM Performed by: Hedda Slade, CRNA Pre-anesthesia Checklist: Patient identified, Patient being monitored, Timeout performed, Emergency Drugs available and Suction available Patient Re-evaluated:Patient Re-evaluated prior to induction Oxygen Delivery Method: Circle system utilized Preoxygenation: Pre-oxygenation with 100% oxygen Induction Type: IV induction Ventilation: Mask ventilation without difficulty LMA: LMA inserted LMA Size: 4.5 Tube type: Oral Number of attempts: 1 Placement Confirmation: positive ETCO2 and breath sounds checked- equal and bilateral Tube secured with: Tape Dental Injury: Teeth and Oropharynx as per pre-operative assessment

## 2018-10-14 ENCOUNTER — Encounter: Payer: Self-pay | Admitting: Podiatry

## 2018-10-14 LAB — GLUCOSE, CAPILLARY
Glucose-Capillary: 77 mg/dL (ref 70–99)
Glucose-Capillary: 176 mg/dL — ABNORMAL HIGH (ref 70–99)
Glucose-Capillary: 193 mg/dL — ABNORMAL HIGH (ref 70–99)

## 2018-10-14 MED ORDER — EPOETIN ALFA 10000 UNIT/ML IJ SOLN
4000.0000 [IU] | INTRAMUSCULAR | Status: DC
  Administered 2018-10-14 – 2018-10-16 (×2): 4000 [IU] via INTRAVENOUS

## 2018-10-14 MED ORDER — EPOETIN ALFA 10000 UNIT/ML IJ SOLN: 4000.0000 [IU] | INTRAMUSCULAR | Status: DC

## 2018-10-14 MED ORDER — OXYCODONE-ACETAMINOPHEN 5-325 MG PO TABS
2.0000 | ORAL_TABLET | ORAL | Status: DC | PRN
  Administered 2018-10-14 – 2018-10-16 (×6): 2 via ORAL
  Filled 2018-10-14 (×6): qty 2

## 2018-10-14 MED ORDER — VANCOMYCIN HCL IN DEXTROSE 750-5 MG/150ML-% IV SOLN
750.0000 mg | INTRAVENOUS | Status: DC
  Administered 2018-10-14 – 2018-10-16 (×2): 750 mg via INTRAVENOUS
  Filled 2018-10-14 (×3): qty 150

## 2018-10-14 MED ORDER — OXYCODONE HCL 5 MG PO TABS
10.0000 mg | ORAL_TABLET | Freq: Once | ORAL | Status: AC
  Administered 2018-10-14: 10 mg via ORAL
  Filled 2018-10-14: qty 2

## 2018-10-14 MED ORDER — VANCOMYCIN HCL IN DEXTROSE 750-5 MG/150ML-% IV SOLN
750.0000 mg | INTRAVENOUS | Status: DC
  Filled 2018-10-14: qty 150

## 2018-10-14 MED ORDER — VANCOMYCIN HCL IN DEXTROSE 750-5 MG/150ML-% IV SOLN
750.0000 mg | Freq: Once | INTRAVENOUS | Status: DC
  Filled 2018-10-14: qty 150

## 2018-10-14 NOTE — Progress Notes (Signed)
PRE-HD   10/14/18 1550  Vital Signs  Temp 98.8 F (37.1 C)  Temp Source Oral  Pulse Rate (!) 57  Pulse Rate Source Monitor  Resp 15  BP (!) 97/55  BP Location Right Arm  BP Method Automatic  Patient Position (if appropriate) Lying  Oxygen Therapy  SpO2 98 %  O2 Device Room Air  Time-Out for Hemodialysis  What Procedure? hd  Pt Identifiers(min of two) First/Last Name;MRN/Account#  Correct Site? Yes  Correct Side? Yes  Correct Procedure? Yes  Consents Verified? Yes  Rad Studies Available? N/A  Safety Precautions Reviewed? Yes  Education / Care Plan  Dialysis Education Provided Yes  Documented Education in Care Plan Yes  Fistula / Graft Left Forearm Arteriovenous fistula  Placement Date/Time: 06/09/18 1325   Placed prior to admission: No  Orientation: Left  Access Location: Forearm  Access Type: Arteriovenous fistula  Site Condition No complications;Other (Comment) (edema left arm)  Fistula / Graft Assessment Present;Thrill;Bruit  Needle Size 15  Drainage Description None

## 2018-10-14 NOTE — NC FL2 (Signed)
[place] LEVEL OF CARE SCREENING TOOL     IDENTIFICATION  Patient Name: Jose [name] Birthdate: 12-24-1945 Sex: male Admission Date (Current Location): 10/11/2018  [place] and [place] Number:  Engineering geologist and Address:  [facility], [address] [place], [place] [address]      Provider Number: [id number]  Attending Physician Name and Address:  Vaughan Basta, *  Relative Name and Phone Number:       Current Level of Care: Hospital Recommended Level of Care: [place] Prior Approval Number:    Date Approved/Denied:   PASRR Number: [id number]   Discharge Plan: SNF    Current Diagnoses: Patient Active Problem List   Diagnosis Date Noted  . Ischemic foot 10/11/2018  . Atherosclerosis of native arteries of the extremities with gangrene ([place]) 07/09/2018  . Gangrene of toe of right foot ([place]) 06/13/2018  . Diabetes mellitus ([place]) 04/16/2018  . Hypertension 04/16/2018  . ESRD on dialysis ([place]) 04/16/2018  . Malnutrition of moderate degree 03/27/2018  . Renal failure 03/21/2018    Orientation RESPIRATION BLADDER Height & Weight     Self, Time, Situation, Place  Normal Continent Weight: 68.2 kg Height:  6\' 1"  (185.4 cm)  BEHAVIORAL SYMPTOMS/MOOD NEUROLOGICAL BOWEL NUTRITION STATUS      Continent Diet(Heart Healthy)  AMBULATORY STATUS COMMUNICATION OF NEEDS Skin   Extensive Assist Verbally Surgical wounds                       Personal Care Assistance Level of Assistance  Bathing, Dressing Bathing Assistance: Limited assistance   Dressing Assistance: Limited assistance     Functional Limitations Info             SPECIAL CARE FACTORS FREQUENCY  PT (By licensed PT), OT (By licensed OT)                    Contractures Contractures Info: Not present    Additional Factors Info  Code Status, Allergies Code Status Info: Full Allergies Info: Penicillin G            Current Medications (10/14/2018):  This is the current hospital active medication list Current Facility-Administered Medications  Medication Dose Route Frequency Provider Last Rate Last Dose  . allopurinol (ZYLOPRIM) tablet 100 mg  100 mg Oral Daily Samara Deist, DPM   100 mg at 10/14/18 3474  . amLODipine (NORVASC) tablet 10 mg  10 mg Oral Daily Samara Deist, DPM   10 mg at 10/14/18 0807  . aspirin EC tablet 81 mg  81 mg Oral Daily Samara Deist, DPM   81 mg at 10/14/18 2595  . atorvastatin (LIPITOR) tablet 10 mg  10 mg Oral q1800 Samara Deist, DPM   10 mg at 10/13/18 1645  . ceFEPIme (MAXIPIME) 1 g in sodium chloride 0.9 % 100 mL IVPB  1 g Intravenous Q24H Samara Deist, DPM 200 mL/hr at 10/13/18 2134 1 g at 10/13/18 2134  . cloNIDine (CATAPRES) tablet 0.2 mg  0.2 mg Oral BID Samara Deist, DPM   0.2 mg at 10/14/18 6387  . clopidogrel (PLAVIX) tablet 75 mg  75 mg Oral Daily Samara Deist, DPM   75 mg at 10/14/18 0807  . docusate sodium (COLACE) capsule 100 mg  100 mg Oral BID PRN Samara Deist, DPM      . [START ON 10/15/2018] epoetin alfa (EPOGEN) injection 4,000 Units  4,000 Units Intravenous Q M,W,F-HD Vaughan Basta, MD      .  escitalopram (LEXAPRO) tablet 10 mg  10 mg Oral Daily Samara Deist, DPM   10 mg at 10/14/18 9924  . gabapentin (NEURONTIN) capsule 100 mg  100 mg Oral TID Samara Deist, DPM   100 mg at 10/14/18 2683  . heparin injection 5,000 Units  5,000 Units Subcutaneous Q8H Samara Deist, DPM   5,000 Units at 10/14/18 0531  . hydrALAZINE (APRESOLINE) tablet 25 mg  25 mg Oral Q8H Samara Deist, DPM   25 mg at 10/14/18 0531  . insulin aspart (novoLOG) injection 0-5 Units  0-5 Units Subcutaneous QHS Samara Deist, DPM      . insulin aspart (novoLOG) injection 0-9 Units  0-9 Units Subcutaneous TID WC Samara Deist, DPM   2 Units at 10/14/18 1236  . multivitamin (RENA-VIT) tablet 1 tablet  1 tablet Oral QHS Samara Deist, DPM   1 tablet at 10/13/18 2131  .  nicotine (NICODERM CQ - dosed in mg/24 hours) patch 21 mg  21 mg Transdermal Daily Samara Deist, DPM   21 mg at 10/14/18 0813  . oxyCODONE-acetaminophen (PERCOCET/ROXICET) 5-325 MG per tablet 2 tablet  2 tablet Oral Q4H PRN Vaughan Basta, MD   2 tablet at 10/14/18 1336  . traZODone (DESYREL) tablet 50 mg  50 mg Oral QHS Samara Deist, DPM   50 mg at 10/13/18 2131  . [START ON 10/15/2018] vancomycin (VANCOCIN) IVPB 750 mg/150 ml premix  750 mg Intravenous Q M,W,F-HD Vaughan Basta, MD      . vancomycin (VANCOCIN) IVPB 750 mg/150 ml premix  750 mg Intravenous Once Vaughan Basta, MD         Discharge Medications: Please see discharge summary for a list of discharge medications.  Relevant Imaging Results:  Relevant Lab Results:   Additional Information ss# [id number]  [name], RN

## 2018-10-14 NOTE — Care Management Important Message (Signed)
Important Message  Patient Details  Name: Jose [name] MRN: [id number] Date of Birth: 1946/05/20   Medicare Important Message Given:  Yes    [name] 10/14/2018, 1:37 PM

## 2018-10-14 NOTE — Progress Notes (Signed)
Machine Check

## 2018-10-14 NOTE — TOC Progression Note (Signed)
Transition of Care [facility]) - Progression Note    Patient Details  Name: ELBER GALYEAN MRN: [id number] Date of Birth: 03/05/1946  [facility] CM/SW Contact  [name], RN Phone Number: 10/14/2018, 2:55 PM  Clinical Narrative:    Patient POD1.   States he resides with his sister Uses [facility] for transportation to HD Has cane and BSC in the home [facility]acy [facility], and his son picks up his medication for him  PT has assessed patient and recommends SNF.  Patient agreeable to bed search.  FL2 complete and sent for signature.  Bed search initiated.  PASRR pending   Expected Discharge Plan: [place] Barriers to Discharge: Continued Medical Work up  Expected Discharge Plan and Services Expected Discharge Plan: [place]   Discharge Planning Services: CM Consult   Living arrangements for the past 2 months: Single Family Home                               Date [place]: 10/12/18 Time [place]: 1006 Representative spoke with at [place] Determinants of Health ([place]) Interventions    Readmission Risk Interventions Readmission Risk Prevention Plan 10/14/2018 10/12/2018  Transportation Screening Complete -  [place] or [place] Complete Complete  Social Work Consult for [place] Planning/Counseling Not Complete -  Palliative Care Screening Not Applicable -  Medication Review Press photographer) Complete Complete  Some recent data might be hidden

## 2018-10-14 NOTE — Plan of Care (Signed)
  Problem: Pain Managment: Goal: General experience of comfort will improve Outcome: Progressing  Pain controlled with PRN pain medication.   Problem: Activity: Goal: Risk for activity intolerance will decrease Outcome: Progressing Pt up to chair with PT

## 2018-10-14 NOTE — Evaluation (Signed)
Physical Therapy Evaluation Patient Details Name: BASSAM DRESCH MRN: [id number] DOB: 1946/03/19 Today's Date: 10/14/2018   History of Present Illness  Jose [name] is a 73yo male who comes to [facility] with persistent Bilat foot pain and increased drainage from food wound. Pt underwent angioplasty last week PTA. Pt admitted for worsening of foot infections, and on 5/13 underwent Right transmet ampuation and Left toes 2 and 3 amputation. PMH: CKD on HD (3-4 months), depression, DM, glaucoma, gout, HLD, HTN, CVA. Pt reports his wife recently passed away ~3 months ago.   Clinical Impression  Pt admitted with above diagnosis. Pt currently with functional limitations due to the deficits listed below (see "PT Problem List"). Upon entry, pt in bed, awake and agreeable to participate, having just finished breakfast tray. The pt is alert and oriented x3, pleasant, conversational, and generally a good historian, but responses are tangential at times, and patient has slurred speech as well, which he says is not his normal state. Pt educated on his weightbearing status, which he verbalizes as understood, but in practice he is physically unable to maintain due to weakness, largely with Rt NWB, and at certain points may be utilizing as much as PWB (50%) despite verbal cues to elevated foot. Sent a haiku message to Dr. Vickki Muff asking if a darco orthowedge shoe is medically needed for the RLE, awaiting response as of time of this note. Pt has significant weakness, unable to rise without maxA, and demonstrates continuous posterior lean throughout while he is only abel to partially mediate when cued. Functional mobility assessment demonstrates increased effort/time requirements, poor tolerance, and absolute need for physical assistance, whereas the patient performed these at a higher level of independence PTA. It is unclear that he will have adequate physical assistance at home given his current state, hence a short term rehab  stay at SNF would provide the best opportunity for the patient to progress his independence with mobility to a level that could be assisted at home. DC to home seems precarious as he will be unable to maintain weight bearing restrictions, unable to enter/exit home due to stairs (also a problem for dialysis compliance), and his family is unable to physically assist the patient at max assist levels. Pt will benefit from skilled PT intervention to increase independence and safety with basic mobility in preparation for discharge to the venue listed below.       Follow Up Recommendations SNF    Equipment Recommendations  None recommended by PT    Recommendations for Other Services       Precautions / Restrictions Precautions Precautions: Fall Required Braces or Orthoses: Other Brace Other Brace: Still awaiting reponse from podiatry as of 5/14, but used pt's darco medsurge shoe  for transfer, which he was issued PTA.  Restrictions Weight Bearing Restrictions: Yes RLE Weight Bearing: Non weight bearing LLE Weight Bearing: Partial weight bearing LLE Partial Weight Bearing Percentage or Pounds: "Ok for Wb to left foot for transfer." Per Dr. Vickki Muff  Other Position/Activity Restrictions: "Attempt NWB to right foot but may need WB to right heel for balance." Per Dr. Vickki Muff       Mobility  Bed Mobility Overal bed mobility: Independent                Transfers Overall transfer level: Needs assistance Equipment used: Rolling walker (2 wheeled)(Left darco medsurge square toe shoe) Transfers: Sit to/from [facility] Sit to Stand: From elevated surface;Max assist;Mod assist(maxA to rise, ModA to remain up. )  Stand pivot transfers: (near continuous posterior lean)       General transfer comment: Pt appears to comprehend instructions for weight bearing orders, but he is maintain weightbearing for transfers.   Ambulation/Gait Ambulation/Gait assistance: (deferred; pt  cleared for transfers only and currently not able to maintain weight bearing orders. )              Stairs            Wheelchair Mobility    Modified Rankin (Stroke Patients Only)       Balance Overall balance assessment: Needs assistance   Sitting balance-Leahy Scale: Good   Postural control: Posterior lean Standing balance support: During functional activity Standing balance-Leahy Scale: Zero                               Pertinent Vitals/Pain Pain Assessment: 0-10 Pain Score: 7  Pain Location: bilat feet Pain Descriptors / Indicators: Burning Pain Intervention(s): Limited activity within patient's tolerance;Premedicated before session;Monitored during session    Home Living Family/patient expects to be discharged to:: Private residence Living Arrangements: Other relatives   Type of Home: House(sister's house) Home Access: Stairs to enter Entrance Stairs-Rails: Can reach both Entrance Stairs-Number of Steps: 2 Home Layout: One level Home Equipment: Walker - 2 wheels;Cane - single point;Wheelchair - manual Additional Comments: just got a WC recently    Prior Function Level of Independence: Needs assistance      ADL's / Homemaking Assistance Needed: nephew helps with bathing, dressing, toiletting; able to self feed.   Comments: has been on HD for 3-4 months, Wife died ~3 months ago.      Hand Dominance   Dominant Hand: Right    Extremity/Trunk Assessment   Upper Extremity Assessment Upper Extremity Assessment: Generalized weakness    Lower Extremity Assessment Lower Extremity Assessment: Generalized weakness       Communication      Cognition Arousal/Alertness: Lethargic;Suspect due to medications(drowsy appearing, slurring words. ) Behavior During Therapy: [facility] for tasks assessed/performed Overall Cognitive Status: Within Functional Limits for tasks assessed                                        General  Comments      Exercises     Assessment/Plan    PT Assessment Patient needs continued PT services  PT Problem List Decreased strength;Decreased range of motion;Decreased activity tolerance;Decreased balance;Decreased mobility;Decreased cognition;Decreased knowledge of precautions;Cardiopulmonary status limiting activity       PT Treatment Interventions DME instruction;Balance training;Stair training;Gait training;Therapeutic exercise;Functional mobility training;Therapeutic activities;Cognitive remediation;Patient/family education;Wheelchair mobility training    PT Goals (Current goals can be found in the Care Plan section)  Acute Rehab PT Goals Patient Stated Goal: improve independence with transfers PT Goal Formulation: With patient Time For Goal Achievement: 10/28/18 Potential to Achieve Goals: Good    Frequency 7X/week   Barriers to discharge Inaccessible home environment has a WC, but has 2 steps to access living space    Co-evaluation               AM-PAC PT "6 Clicks" Mobility  Outcome Measure Help needed turning from your back to your side while in a flat bed without using bedrails?: None Help needed moving from lying on your back to sitting on the side of a flat bed without using bedrails?: None  Help needed moving to and from a bed to a chair (including a wheelchair)?: A Lot Help needed standing up from a chair using your arms (e.g., wheelchair or bedside chair)?: Total Help needed to walk in hospital room?: Total Help needed climbing 3-5 steps with a railing? : Total 6 Click Score: 13    End of Session Equipment Utilized During Treatment: Gait belt;Other (comment);Oxygen(orthoboot) Activity Tolerance: Patient limited by lethargy;Patient limited by fatigue;Treatment limited secondary to medical complications (Comment)(not able to maintain WB restrictions) Patient left: in chair;with call bell/phone within reach;with SCD's reapplied Nurse Communication: Weight  bearing status;Mobility status(O2 need, lethargy) PT Visit Diagnosis: Unsteadiness on feet (R26.81);Other abnormalities of gait and mobility (R26.89)    Time: 0016-4290 PT Time Calculation (min) (ACUTE ONLY): 35 min   Charges:   PT Evaluation $PT Eval Moderate Complexity: 1 Mod PT Treatments $Gait Training: 8-22 mins        10:03 AM, 10/14/18 Etta Grandchild, PT, DPT Physical Therapist - [facility]  [phone] ([place])     [place] C 10/14/2018, 9:57 AM

## 2018-10-14 NOTE — NC FL2 (Signed)
[place] LEVEL OF CARE SCREENING TOOL     IDENTIFICATION  Patient Name: Jose [name] Birthdate: 1945/12/26 Sex: male Admission Date (Current Location): 10/11/2018  [facility] and [place] Number:  Engineering geologist and Address:  [facility], [address], [place], [place] [address]      Provider Number: [id number]  Attending Physician Name and Address:  Vaughan Basta, *  Relative Name and Phone Number:       Current Level of Care: Hospital Recommended Level of Care: [place] Prior Approval Number:    Date Approved/Denied:   PASRR Number: pending  Discharge Plan: SNF    Current Diagnoses: Patient Active Problem List   Diagnosis Date Noted  . Ischemic foot 10/11/2018  . Atherosclerosis of native arteries of the extremities with gangrene ([place]) 07/09/2018  . Gangrene of toe of right foot ([place]) 06/13/2018  . Diabetes mellitus ([place]) 04/16/2018  . Hypertension 04/16/2018  . ESRD on dialysis ([place]) 04/16/2018  . Malnutrition of moderate degree 03/27/2018  . Renal failure 03/21/2018    Orientation RESPIRATION BLADDER Height & Weight     Self, Time, Situation, Place  Normal Continent Weight: 68.2 kg Height:  6\' 1"  (185.4 cm)  BEHAVIORAL SYMPTOMS/MOOD NEUROLOGICAL BOWEL NUTRITION STATUS      Continent Diet(Heart Healthy)  AMBULATORY STATUS COMMUNICATION OF NEEDS Skin   Extensive Assist Verbally Surgical wounds                       Personal Care Assistance Level of Assistance  Bathing, Dressing Bathing Assistance: Limited assistance   Dressing Assistance: Limited assistance     Functional Limitations Info             SPECIAL CARE FACTORS FREQUENCY  PT (By licensed PT), OT (By licensed OT)                    Contractures Contractures Info: Not present    Additional Factors Info  Code Status, Allergies Code Status Info: Full Allergies Info: Penicillin G           Current  Medications (10/14/2018):  This is the current hospital active medication list Current Facility-Administered Medications  Medication Dose Route Frequency Provider Last Rate Last Dose  . allopurinol (ZYLOPRIM) tablet 100 mg  100 mg Oral Daily Samara Deist, DPM   100 mg at 10/14/18 3710  . amLODipine (NORVASC) tablet 10 mg  10 mg Oral Daily Samara Deist, DPM   10 mg at 10/14/18 0807  . aspirin EC tablet 81 mg  81 mg Oral Daily Samara Deist, DPM   81 mg at 10/14/18 6269  . atorvastatin (LIPITOR) tablet 10 mg  10 mg Oral q1800 Samara Deist, DPM   10 mg at 10/13/18 1645  . ceFEPIme (MAXIPIME) 1 g in sodium chloride 0.9 % 100 mL IVPB  1 g Intravenous Q24H Samara Deist, DPM 200 mL/hr at 10/13/18 2134 1 g at 10/13/18 2134  . cloNIDine (CATAPRES) tablet 0.2 mg  0.2 mg Oral BID Samara Deist, DPM   0.2 mg at 10/14/18 4854  . clopidogrel (PLAVIX) tablet 75 mg  75 mg Oral Daily Samara Deist, DPM   75 mg at 10/14/18 0807  . docusate sodium (COLACE) capsule 100 mg  100 mg Oral BID PRN Samara Deist, DPM      . [START ON 10/15/2018] epoetin alfa (EPOGEN) injection 4,000 Units  4,000 Units Intravenous Q M,W,F-HD Vaughan Basta, MD      .  escitalopram (LEXAPRO) tablet 10 mg  10 mg Oral Daily Samara Deist, DPM   10 mg at 10/14/18 8381  . gabapentin (NEURONTIN) capsule 100 mg  100 mg Oral TID Samara Deist, DPM   100 mg at 10/14/18 8403  . heparin injection 5,000 Units  5,000 Units Subcutaneous Q8H Samara Deist, DPM   5,000 Units at 10/14/18 0531  . hydrALAZINE (APRESOLINE) tablet 25 mg  25 mg Oral Q8H Samara Deist, DPM   25 mg at 10/14/18 0531  . insulin aspart (novoLOG) injection 0-5 Units  0-5 Units Subcutaneous QHS Samara Deist, DPM      . insulin aspart (novoLOG) injection 0-9 Units  0-9 Units Subcutaneous TID WC Samara Deist, DPM   2 Units at 10/14/18 1236  . multivitamin (RENA-VIT) tablet 1 tablet  1 tablet Oral QHS Samara Deist, DPM   1 tablet at 10/13/18 2131  . nicotine  (NICODERM CQ - dosed in mg/24 hours) patch 21 mg  21 mg Transdermal Daily Samara Deist, DPM   21 mg at 10/14/18 0813  . oxyCODONE-acetaminophen (PERCOCET/ROXICET) 5-325 MG per tablet 2 tablet  2 tablet Oral Q4H PRN Vaughan Basta, MD   2 tablet at 10/14/18 1336  . traZODone (DESYREL) tablet 50 mg  50 mg Oral QHS Samara Deist, DPM   50 mg at 10/13/18 2131  . [START ON 10/15/2018] vancomycin (VANCOCIN) IVPB 750 mg/150 ml premix  750 mg Intravenous Q M,W,F-HD Vaughan Basta, MD      . vancomycin (VANCOCIN) IVPB 750 mg/150 ml premix  750 mg Intravenous Once Vaughan Basta, MD         Discharge Medications: Please see discharge summary for a list of discharge medications.  Relevant Imaging Results:  Relevant Lab Results:   Additional Information ss# [id number]  [name], RN

## 2018-10-14 NOTE — Progress Notes (Signed)
[facility], [place] 10/14/18  Subjective:   LOS: 3 05/13 0701 - 05/14 0700 In: 740 [P.O.:240; I.V.:400; IV Piggyback:100] Out: 100 [Urine:100] Complains of pain and numbness in his toes Underwent b/l toe amputations and I & D  No c/o thismorning  Objective:  Vital signs in last 24 hours:  Temp:  [97.2 F (36.2 C)-98.5 F (36.9 C)] 97.8 F (36.6 C) (05/14 0433) Pulse Rate:  [51-77] 57 (05/14 0433) Resp:  [8-18] 18 (05/14 0433) BP: (102-127)/(54-78) 116/70 (05/14 0851) SpO2:  [85 %-100 %] 96 % (05/14 0910)  Weight change:  Filed Weights   10/11/18 1554 10/11/18 1604 10/12/18 0945  Weight: 68 kg 68 kg 68.2 kg    Intake/Output:    Intake/Output Summary (Last 24 hours) at 10/14/2018 1022 Last data filed at 10/14/2018 0900 Gross per 24 hour  Intake 1220 ml  Output 100 ml  Net 1120 ml     Physical Exam: General:  No acute distress,  HEENT  anicteric, moist oral mucous membranes  Neck  supple, no masses  Pulm/lungs  normal breathing effort, clear to auscultation, [place] o2  CVS/Heart  irregular  Abdomen:   Soft, nontender  Extremities:  Gangrenous toes bilaterally  Neurologic:  Alert, oriented  Skin:  No acute rashes  Access:  AV fistula       Basic Metabolic Panel:  Recent Labs  Lab 10/11/18 1626 10/12/18 0428 10/13/18 1338  NA 142 138 136  K 3.3* 3.6 4.0  CL 107 103 99  CO2 25 23 26   GLUCOSE 97 116* 87  BUN 54* 58* 40*  CREATININE 5.97* 6.11* 5.07*  CALCIUM 8.7* 8.4* 8.4*     CBC: Recent Labs  Lab 10/11/18 1626 10/12/18 0428  WBC 7.3 6.5  NEUTROABS 4.4  --   HGB 10.0* 9.9*  HCT 30.7* 30.6*  MCV 95.9 95.6  PLT 159 146*      Lab Results  Component Value Date   HEPBSAG Negative 03/22/2018   HEPBSAB Reactive 03/22/2018   HEPBIGM Negative 03/22/2018      Microbiology:  Recent Results (from the past 240 hour(s))  Wound or Superficial Culture     Status: None (Preliminary result)   Collection Time: 10/11/18   4:26 PM  Result Value Ref Range Status   Specimen Description   Final    FOOT LEFT Performed at [facility], [address], [place], [place] [address]    Special Requests   Final    Immunocompromised Performed at [facility], [address] [place], [place] [address]    Gram Stain   Final    FEW WBC PRESENT, PREDOMINANTLY PMN ABUNDANT GRAM POSITIVE COCCI ABUNDANT GRAM NEGATIVE RODS    Culture   Final    CULTURE REINCUBATED FOR BETTER GROWTH Performed at [facility], [place] [address] [place], [place] [address]    Report Status PENDING  Incomplete  SARS Coronavirus 2 (CEPHEID - Performed in [place] hospital lab), Hosp Order     Status: None   Collection Time: 10/11/18  6:24 PM  Result Value Ref Range Status   SARS Coronavirus 2 NEGATIVE NEGATIVE Final    Comment: (NOTE) If result is NEGATIVE SARS-CoV-2 target nucleic acids are NOT DETECTED. The SARS-CoV-2 RNA is generally detectable in upper and lower  respiratory specimens during the acute phase of infection. The lowest  concentration of SARS-CoV-2 viral copies this assay can detect is 250  copies / mL. A negative result does  not preclude SARS-CoV-2 infection  and should not be used as the sole basis for treatment or other  patient management decisions.  A negative result may occur with  improper specimen collection / handling, submission of specimen other  than nasopharyngeal swab, presence of viral mutation(s) within the  areas targeted by this assay, and inadequate number of viral copies  (<250 copies / mL). A negative result must be combined with clinical  observations, patient history, and epidemiological information. If result is POSITIVE SARS-CoV-2 target nucleic acids are DETECTED. The SARS-CoV-2 RNA is generally detectable in upper and lower  respiratory specimens dur ing the acute phase of infection.  Positive  results are indicative of active infection with SARS-CoV-2.  Clinical   correlation with patient history and other diagnostic information is  necessary to determine patient infection status.  Positive results do  not rule out bacterial infection or co-infection with other viruses. If result is PRESUMPTIVE POSTIVE SARS-CoV-2 nucleic acids MAY BE PRESENT.   A presumptive positive result was obtained on the submitted specimen  and confirmed on repeat testing.  While 2019 novel coronavirus  (SARS-CoV-2) nucleic acids may be present in the submitted sample  additional confirmatory testing may be necessary for epidemiological  and / or clinical management purposes  to differentiate between  SARS-CoV-2 and other Sarbecovirus currently known to infect humans.  If clinically indicated additional testing with an alternate test  methodology [phone] is advised. The SARS-CoV-2 RNA is generally  detectable in upper and lower respiratory sp ecimens during the acute  phase of infection. The expected result is Negative. Fact Sheet for Patients:  StrictlyIdeas.no Fact Sheet for Healthcare Providers: BankingDealers.co.za This test is not yet approved or cleared by the [place] FDA and has been authorized for detection and/or diagnosis of SARS-CoV-2 by FDA under an Emergency Use Authorization (EUA).  This EUA will remain in effect (meaning this test can be used) for the duration of the COVID-19 declaration under Section 564(b)(1) of the Act, 21 U.S.C. section 360bbb-3(b)(1), unless the authorization is terminated or revoked sooner. Performed at [facility], [address] [place], [place] [address]   Surgical pcr screen     Status: None   Collection Time: 10/13/18  1:37 PM  Result Value Ref Range Status   MRSA, PCR NEGATIVE NEGATIVE Final   Staphylococcus aureus NEGATIVE NEGATIVE Final    Comment: (NOTE) The Xpert SA Assay (FDA approved for NASAL specimens in patients 61 years of age and older), is one  component of a comprehensive surveillance program. It is not intended to diagnose infection nor to guide or monitor treatment. Performed at [facility], [address] [place], [place] [address]   Aerobic/Anaerobic Culture (surgical/deep wound)     Status: None (Preliminary result)   Collection Time: 10/13/18  2:30 PM  Result Value Ref Range Status   Specimen Description   Final    FOOT LEFT FOOT SECOND TOE AMPUTATION Performed at [facility], [address], [place], [place] [address]    Special Requests   Final    NONE Performed at [facility], [place], [place] [address]    Gram Stain   Final    RARE WBC PRESENT, PREDOMINANTLY PMN FEW GRAM POSITIVE COCCI Performed at [facility], [place] [address] [place], [place] [address]    Culture PENDING  Incomplete   Report Status PENDING  Incomplete    Coagulation Studies: No results for input(s): LABPROT, INR in  the last 72 hours.  Urinalysis: No results for input(s): COLORURINE, LABSPEC, PHURINE, GLUCOSEU, HGBUR, BILIRUBINUR, KETONESUR, PROTEINUR, UROBILINOGEN, NITRITE, LEUKOCYTESUR in the last 72 hours.  Invalid input(s): APPERANCEUR    Imaging: No results found.   Medications:   . ceFEPime (MAXIPIME) IV 1 g (10/13/18 2134)  . vancomycin Stopped (10/12/18 1429)   . allopurinol  100 mg Oral Daily  . amLODipine  10 mg Oral Daily  . aspirin EC  81 mg Oral Daily  . atorvastatin  10 mg Oral q1800  . cloNIDine  0.2 mg Oral BID  . clopidogrel  75 mg Oral Daily  . epoetin (EPOGEN/PROCRIT) injection  4,000 Units Intravenous Q T,Th,Sa-HD  . escitalopram  10 mg Oral Daily  . gabapentin  100 mg Oral TID  . heparin  5,000 Units Subcutaneous Q8H  . hydrALAZINE  25 mg Oral Q8H  . insulin aspart  0-5 Units Subcutaneous QHS  . insulin aspart  0-9 Units Subcutaneous TID WC  . multivitamin  1 tablet Oral QHS  . nicotine  21 mg Transdermal Daily  . traZODone  50 mg Oral QHS    docusate sodium, oxyCODONE-acetaminophen  Assessment/ Plan:  73 y.o. male with end-stage renal disease, depression, diabetes with complications, gout, history of stroke, peripheral vascular disease [address] [facility]/TTS 2/TW 70.5 kg  Active Problems:   Gangrene of toe of right foot (HCC)   Ischemic foot -b/l gangrenous toe amputations by Dr. Vickki Muff    #.  End-stage renal disease -We will continue TTS schedule. Underwent HD yesterday, next Dialysis probably tomorrow. Hopefully will be less agitated  Recent Labs    10/11/18 1626 10/12/18 0428 10/13/18 1338  CREATININE 5.97* 6.11* 5.07*    #. Anemia of CKD  Lab Results  Component Value Date   HGB 9.9 (L) 10/12/2018  - schedule Epogen with hemodialysis  #. SHPTH -Monitor phosphorus during hospital admission -No binders noted on home medication list    Component Value Date/Time   PTH 226 (H) 03/25/2018 1557   Lab Results  Component Value Date   PHOS 6.1 (H) 06/17/2018   PHOS 5.7 (H) 06/17/2018    #. Diabetes type 2 with CKD No results found for: HGBA1C. -Maintained with oral hypoglycemics   LOS: [place] 5/14/202010:22 [place], [place]

## 2018-10-15 LAB — AEROBIC CULTURE W GRAM STAIN (SUPERFICIAL SPECIMEN)

## 2018-10-15 LAB — SURGICAL PATHOLOGY

## 2018-10-15 LAB — GLUCOSE, CAPILLARY
Glucose-Capillary: 101 mg/dL — ABNORMAL HIGH (ref 70–99)
Glucose-Capillary: 147 mg/dL — ABNORMAL HIGH (ref 70–99)
Glucose-Capillary: 109 mg/dL — ABNORMAL HIGH (ref 70–99)
Glucose-Capillary: 150 mg/dL — ABNORMAL HIGH (ref 70–99)

## 2018-10-15 MED ORDER — AMOXICILLIN-POT CLAVULANATE 875-125 MG PO TABS: 1.0000 | ORAL_TABLET | Freq: Two times a day (BID) | ORAL | 0 refills | Status: AC

## 2018-10-15 MED ORDER — DOCUSATE SODIUM 100 MG PO CAPS: 100.0000 mg | ORAL_CAPSULE | Freq: Two times a day (BID) | ORAL | 0 refills | Status: DC | PRN

## 2018-10-15 MED ORDER — OXYCODONE-ACETAMINOPHEN 5-325 MG PO TABS: 1.0000 | ORAL_TABLET | Freq: Four times a day (QID) | ORAL | 0 refills | Status: DC | PRN

## 2018-10-15 NOTE — Progress Notes (Signed)
F/u left and right foot. Some pain to right foot but improved from pre-op.  Dressing changed b/l  Can keep dressing clean,dry and intact.  Change with dry dressing if gets soiled.  PT has see.  NWB rightand Heel WB on left with assistance.  STable from podiatry standpoint.  F/U with me in 1 week.

## 2018-10-15 NOTE — Progress Notes (Signed)
Physical Therapy Treatment Patient Details Name: Jose [name] MRN: [id number] DOB: 09-27-1945 Today's Date: 10/15/2018    History of Present Illness Jose [name] is a 73yo male who comes to [facility] with persistent Bilat foot pain and increased drainage from food wound. Pt underwent angioplasty last week PTA. Pt admitted for worsening of foot infections, and on 5/13 underwent Right transmet ampuation and Left toes 2 and 3 amputation. PMH: CKD on HD (3-4 months), depression, DM, glaucoma, gout, HLD, HTN, CVA. Pt reports his wife recently passed away ~3 months ago.     PT Comments    Pt asleep in room upon entry, easily awakens to authors greeting, and is agreeable to participate. Pt still appears somewhat drowsy, which he confirms, and he reports is different from his baseline. Pt moves to EOB with modified independence. Patient recently elected to DC to home which is discussed in depth with the patient this visit as this logistically presents problems with medical compliance. Pt reports some familial concerns regarding DC to a facility. Weight bearing statuses are discussed and demonstrated at length in context to transfers and standing. Pt reports he plans to walk using a normal gait upon return to home so that he can get to dialysis- it is explained that this is contrary to medical recommendations from the surgeon and puts his foot healing at risk. Pt reports he will be able to navigate stairs in spite of being unable to AMB at this time because he 'has a big heart'- Pryor Curia pleads with patient to logistically consider in detail the difficulty of navigating steps 6x weekly (in/out) for HD, which as pt is NWB on RLE would require hopping up steps on one leg. Pt performs 4x STS transfers from elevated bed, requires modA, and still is unable to establish a level of standing stability that would allow demonstration of NWB on the RLE until the 4th attempt (which is less than 1 sec x3). Mild dizziness  associated with these transfers. Pt agreeable to stand pivot transfer to bed MaxA+2, to allow for bed linen change as linen is soiled. Pt agreeable at end of session that DC to facility for rehab is the most cogent and safe decision at this time given his difficulty with mobility and limited capacity to perform weight bearing restrictions as directed.      Follow Up Recommendations  SNF     Equipment Recommendations  None recommended by PT    Recommendations for Other Services       Precautions / Restrictions Precautions Precautions: Fall Required Braces or Orthoses: Other Brace Other Brace: Used pt's darco medsurge shoe on Lt foot for transfer; which he was issued PTA; pt has orthowedge shoe for RLE as a last resort only, as he is unable to physically perform NWB at this time Restrictions RLE Weight Bearing: Non weight bearing(orthowedge shoe if unable) LLE Weight Bearing: Partial weight bearing(for transfers) LLE Partial Weight Bearing Percentage or Pounds: "Ok for Wb to left foot for transfer." Per Dr. Vickki Muff  Other Position/Activity Restrictions: "Attempt NWB to right foot but may need WB to right heel for balance." Per Dr. Vickki Muff     Mobility  Bed Mobility Overal bed mobility: Modified Independent             General bed mobility comments: additional time needed, particularly for scooting fwd to EOB   Transfers Overall transfer level: Needs assistance Equipment used: Rolling walker (2 wheeled)(Left post op shoe, Rt orthowedge boot after demonstrates inability to  perform NWB  ) Transfers: Sit to/from [facility] Sit to Stand: From elevated surface;Max assist;Mod assist(performed 4x, difficulty fully coming to standing (getting butt off bed), but RW height adjustments help. Pt just barely able to lift RLE from floor a few time during 4th transfer up.) Stand pivot transfers: Max assist;+2 physical assistance;From elevated surface       General transfer  comment: Pt still physically incapable of maintaining weightbearing restrictions for transfers.   Ambulation/Gait Ambulation/Gait assistance: (not medically cleared for AMB at this time.)               Stairs             Wheelchair Mobility    Modified Rankin (Stroke Patients Only)       Balance Overall balance assessment: Needs assistance   Sitting balance-Leahy Scale: Good     Standing balance support: During functional activity Standing balance-Leahy Scale: Poor                              Cognition Arousal/Alertness: (remains somewhat drowsy; pt agrees and reports this NOT as per baseline) Behavior During Therapy: WFL for tasks assessed/performed Overall Cognitive Status: (slurred speech and delayed response. Pt reports some slurred speech at baseline)                                        Exercises      General Comments        Pertinent Vitals/Pain Pain Assessment: Faces Faces Pain Scale: Hurts even more Pain Location: Right lower leg Pain Descriptors / Indicators: Aching Pain Intervention(s): Limited activity within patient's tolerance;Monitored during session;Repositioned    Home Living                      Prior Function            PT Goals (current goals can now be found in the care plan section) Acute Rehab PT Goals Patient Stated Goal: improve independence with transfers PT Goal Formulation: With patient Time For Goal Achievement: 10/28/18 Potential to Achieve Goals: Good Progress towards PT goals: Progressing toward goals    Frequency    7X/week      PT Plan Current plan remains appropriate    Co-evaluation              AM-PAC PT "6 Clicks" Mobility   Outcome Measure  Help needed turning from your back to your side while in a flat bed without using bedrails?: None Help needed moving from lying on your back to sitting on the side of a flat bed without using bedrails?:  None Help needed moving to and from a bed to a chair (including a wheelchair)?: Total Help needed standing up from a chair using your arms (e.g., wheelchair or bedside chair)?: A Lot Help needed to walk in hospital room?: Total Help needed climbing 3-5 steps with a railing? : Total 6 Click Score: 13    End of Session Equipment Utilized During Treatment: Gait belt;Other (comment);Oxygen(ortho boot (LLE) and orthowedge shoe (RLE) ) Activity Tolerance: Patient limited by lethargy;Patient limited by fatigue;Treatment limited secondary to medical complications (Comment)(drowsy, weak) Patient left: in chair;with call bell/phone within reach;with nursing/sitter in room Nurse Communication: Weight bearing status;Mobility status;Other (comment)(concerns with pt's DC disposition. ) PT Visit Diagnosis: Unsteadiness on feet (R26.81);Other  abnormalities of gait and mobility (R26.89)     Time: [id number] PT Time Calculation (min) (ACUTE ONLY): 33 min  Charges:  $Gait Training: 8-22 mins $Therapeutic Exercise: 8-22 mins                 3:47 PM, 10/15/18 Etta Grandchild, PT, DPT Physical Therapist - [facility]  [phone] ([place])     [place] 10/15/2018, 3:33 PM

## 2018-10-15 NOTE — Discharge Summary (Addendum)
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 Chronic kidney disease   . Depression   . Diabetes mellitus without complication ([place])   . Glaucoma   . Glaucoma   . Gout   . Heart murmur   . HLD (hyperlipidemia)   . Hypertension   . Prostate cancer ([place])   . Renal failure    dialysis t/t/s  . 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 Vickki Muff, vascular input appreciated-no further plans for revascularization  *Acute right lower extremity cellulitis, mild - was on empiric cefepime/vancomycin while in the hospital but now has improved.  Cultures from foot grew Proteus Mirabilis and it's Pan sensitive and will d/c on Oral Augmentin for 7 more days.   *ESRD Stable Nephrology followed patient and will cont. Tu, Thus, Sat Schedule for HD.   *Chronic diabetes mellitus type 2  Continue to hold oral medication, continue SSI w/ Accu-Cheks per routine  *Hypertension - pt. Will cont. His hydralazine, Clonidine, Norvasc. BP stable.   * hx of gout - no acute attack.  - pt. Will cont. His Allopurinol.   * Depression - pt. Will cont. His Lexapro.   * Hyperlipidemia - pt. Will cont. His Atorvastatin  * hx of PVD - cont. Plavix, Statin as mentioned above.   PT suggested SNF.  Patient was initially refusing but now has agreed and will d/c to SNF today.    DISCHARGE CONDITIONS:    Stable  CONSULTS OBTAINED:  Treatment Team:  Murlean Iba, MD Algernon Huxley, MD Samara Deist, DPM  DRUG ALLERGIES:   Allergies  Allergen Reactions  . Penicillin G Other (See Comments)    Other reaction(s): Unknown DID THE REACTION INVOLVE: Swelling of the face/tongue/throat, SOB, or low BP? Unknown Sudden or severe rash/hives, skin peeling, or the inside of the mouth or nose? Unknown Did it require medical treatment? Unknown When did it last happen?unknown If all above answers are "NO", may proceed with cephalosporin use.     DISCHARGE MEDICATIONS:   Allergies as of 10/16/2018      Reactions   Penicillin G Other (See Comments)   Other reaction(s): Unknown DID THE REACTION INVOLVE: Swelling of the face/tongue/throat, SOB, or low BP? Unknown Sudden or severe rash/hives, skin peeling, or the inside of the mouth or nose? Unknown Did it require medical treatment? Unknown When did it last happen?unknown If all above answers are "NO", may proceed with cephalosporin use.      Medication List    STOP taking these medications   doxycycline 100 MG capsule Commonly known as:  VIBRAMYCIN   oxyCODONE-acetaminophen 7.5-325 MG tablet Commonly known as:  Percocet Replaced by:  oxyCODONE-acetaminophen 5-325 MG tablet     TAKE these medications   allopurinol 100 MG tablet Commonly known as:  ZYLOPRIM Take 100 mg by mouth daily.   amLODipine 10 MG tablet Commonly  known as:  NORVASC Take 10 mg by mouth daily.   amoxicillin-clavulanate 875-125 MG tablet Commonly known as:  Augmentin Take 1 tablet by mouth 2 (two) times daily for 7 days.   aspirin 81 MG EC tablet Take 1 tablet (81 mg total) by mouth daily.   atorvastatin 10 MG tablet Commonly known as:  LIPITOR Take 1 tablet (10 mg total) by mouth daily at 6 PM.   cloNIDine 0.2 MG tablet Commonly known as:  CATAPRES TAKE 1 TABLET BY MOUTH TWICE A DAY   clopidogrel 75 MG tablet Commonly known as:  PLAVIX Take 1  tablet (75 mg total) by mouth daily.   docusate sodium 100 MG capsule Commonly known as:  COLACE Take 1 capsule (100 mg total) by mouth 2 (two) times daily as needed for mild constipation.   feeding supplement (NEPRO CARB STEADY) Liqd Take 237 mLs by mouth 2 (two) times daily between meals.   gabapentin 300 MG capsule Commonly known as:  NEURONTIN Take 1 capsule (300 mg total) by mouth 3 (three) times daily. What changed:  how much to take   glipiZIDE 5 MG tablet Commonly known as:  GLUCOTROL Take 5 mg by mouth daily before breakfast.   hydrALAZINE 25 MG tablet Commonly known as:  APRESOLINE Take 1 tablet (25 mg total) by mouth every 8 (eight) hours.   Lexapro 10 MG tablet Generic drug:  escitalopram Take 10 mg by mouth daily.   multivitamin Tabs tablet Take 1 tablet by mouth at bedtime.   oxyCODONE-acetaminophen 5-325 MG tablet Commonly known as:  PERCOCET/ROXICET Take 1-2 tablets by mouth every 6 (six) hours as needed for moderate pain or severe pain. Replaces:  oxyCODONE-acetaminophen 7.5-325 MG tablet        DISCHARGE INSTRUCTIONS:    Follow with primary care physician in 1 to 2 weeks.  Follow-up with podiatry in 1 week.  Can keep dressing clean,dry and intact.  Change with dry dressing if gets soiled.  PT has see.  NWB rightand Heel WB on left with assistance.  If you experience worsening of your admission symptoms, develop shortness of breath, life threatening emergency, suicidal or homicidal thoughts you must seek medical attention immediately by calling 911 or calling your MD immediately  if symptoms less severe.  You Must read complete instructions/literature along with all the possible adverse reactions/side effects for all the Medicines you take and that have been prescribed to you. Take any new Medicines after you have completely understood and accept all the possible adverse reactions/side effects.   Please note  You were cared for by a hospitalist  during your hospital stay. If you have any questions about your discharge medications or the care you received while you were in the hospital after you are discharged, you can call the unit and asked to speak with the hospitalist on call if the hospitalist that took care of you is not available. Once you are discharged, your primary care physician will handle any further medical issues. Please note that NO REFILLS for any discharge medications will be authorized once you are discharged, as it is imperative that you return to your primary care physician (or establish a relationship with a primary care physician if you do not have one) for your aftercare needs so that they can reassess your need for medications and monitor your lab values.    Today   No acute events overnight.  Complaining of some pain in the right foot.     VITAL SIGNS:  Blood pressure 113/82, pulse (!) 55, temperature 97.8 F (36.6 C), temperature source Oral, resp. rate 20, height 6\' 1"  (1.854 m), weight 76.5 kg, SpO2 98 %.  I/O:    Intake/Output Summary (Last 24 hours) at 10/16/2018 0909 Last data filed at 10/16/2018 0412 Gross per 24 hour  Intake 1400 ml  Output -  Net 1400 ml    PHYSICAL EXAMINATION:  GENERAL:  73 y.o.-year-old patient lying in the bed with no acute distress.  EYES: Pupils equal, round, reactive to light and accommodation. No scleral icterus. Extraocular muscles intact.  HEENT: Head atraumatic, normocephalic. Oropharynx and nasopharynx clear.  NECK:  Supple, no jugular venous distention. No thyroid enlargement, no tenderness.  LUNGS: Normal breath sounds bilaterally, no wheezing, rales,rhonchi or crepitation. No use of accessory muscles of respiration.  CARDIOVASCULAR: S1, S2 normal. No murmurs, rubs, or gallops.  ABDOMEN: Soft, nontender, nondistended. Bowel sounds present. No organomegaly or mass.  EXTREMITIES: No pedal edema, cyanosis, or clubbing. S/p amputation on right foot with b/l dressings  in place. No acute bleeding/drainage.  NEUROLOGIC: Cranial nerves II through XII are intact. No focal motor or sensory deficits b/l.  PSYCHIATRIC: The patient is alert and oriented x 3.  SKIN: No obvious rash, lesion, or ulcer.   Left upper Ext. AV fistula with good bruit/thrill.    DATA REVIEW:   CBC Recent Labs  Lab 10/12/18 0428  WBC 6.5  HGB 9.9*  HCT 30.6*  PLT 146*    Chemistries  Recent Labs  Lab 10/11/18 1626  10/13/18 1338  NA 142   < > 136  K 3.3*   < > 4.0  CL 107   < > 99  CO2 25   < > 26  GLUCOSE 97   < > 87  BUN 54*   < > 40*  CREATININE 5.97*   < > 5.07*  CALCIUM 8.7*   < > 8.4*  AST 26  --   --   ALT 19  --   --   ALKPHOS 46  --   --   BILITOT 0.5  --   --    < > = values in this interval not displayed.    Cardiac Enzymes No results for input(s): TROPONINI in the last 168 hours.  Microbiology Results  Results for orders placed or performed during the hospital encounter of 10/11/18  Wound or Superficial Culture     Status: None   Collection Time: 10/11/18  4:26 PM  Result Value Ref Range Status   Specimen Description   Final    FOOT LEFT Performed at [facility], [address], [place], [place] [address]    Special Requests   Final    Immunocompromised Performed at [facility], [address] [place], [place] [address]    Gram Stain   Final    FEW WBC PRESENT, PREDOMINANTLY PMN ABUNDANT GRAM POSITIVE COCCI ABUNDANT GRAM NEGATIVE RODS Performed at [facility], [place] [address], [place], [place] [address]    Culture   Final    MODERATE PROTEUS MIRABILIS FEW MODERATE ENTEROCOCCUS FAECALIS    Report Status 10/15/2018 FINAL  Final   Organism ID, Bacteria PROTEUS MIRABILIS  Final   Organism ID, Bacteria ENTEROCOCCUS FAECALIS  Final      Susceptibility   Enterococcus faecalis - MIC*    AMPICILLIN <=2 SENSITIVE Sensitive     VANCOMYCIN 1 SENSITIVE Sensitive     GENTAMICIN SYNERGY SENSITIVE Sensitive     *  FEW  MODERATE ENTEROCOCCUS FAECALIS   Proteus mirabilis - MIC*    AMPICILLIN <=2 SENSITIVE Sensitive     CEFAZOLIN <=4 SENSITIVE Sensitive     CEFEPIME <=1 SENSITIVE Sensitive     CEFTAZIDIME <=1 SENSITIVE Sensitive     CEFTRIAXONE <=1 SENSITIVE Sensitive     CIPROFLOXACIN <=0.25 SENSITIVE Sensitive     GENTAMICIN <=1 SENSITIVE Sensitive     IMIPENEM 2 SENSITIVE Sensitive     TRIMETH/SULFA <=20 SENSITIVE Sensitive     AMPICILLIN/SULBACTAM <=2 SENSITIVE Sensitive     PIP/TAZO <=4 SENSITIVE Sensitive     * MODERATE PROTEUS MIRABILIS  SARS Coronavirus 2 (CEPHEID - Performed in [place] hospital lab), Hosp Order     Status: None   Collection Time: 10/11/18  6:24 PM  Result Value Ref Range Status   SARS Coronavirus 2 NEGATIVE NEGATIVE Final    Comment: (NOTE) If result is NEGATIVE SARS-CoV-2 target nucleic acids are NOT DETECTED. The SARS-CoV-2 RNA is generally detectable in upper and lower  respiratory specimens during the acute phase of infection. The lowest  concentration of SARS-CoV-2 viral copies this assay can detect is 250  copies / mL. A negative result does not preclude SARS-CoV-2 infection  and should not be used as the sole basis for treatment or other  patient management decisions.  A negative result may occur with  improper specimen collection / handling, submission of specimen other  than nasopharyngeal swab, presence of viral mutation(s) within the  areas targeted by this assay, and inadequate number of viral copies  (<250 copies / mL). A negative result must be combined with clinical  observations, patient history, and epidemiological information. If result is POSITIVE SARS-CoV-2 target nucleic acids are DETECTED. The SARS-CoV-2 RNA is generally detectable in upper and lower  respiratory specimens dur ing the acute phase of infection.  Positive  results are indicative of active infection with SARS-CoV-2.  Clinical  correlation with patient history and other diagnostic  information is  necessary to determine patient infection status.  Positive results do  not rule out bacterial infection or co-infection with other viruses. If result is PRESUMPTIVE POSTIVE SARS-CoV-2 nucleic acids MAY BE PRESENT.   A presumptive positive result was obtained on the submitted specimen  and confirmed on repeat testing.  While 2019 novel coronavirus  (SARS-CoV-2) nucleic acids may be present in the submitted sample  additional confirmatory testing may be necessary for epidemiological  and / or clinical management purposes  to differentiate between  SARS-CoV-2 and other Sarbecovirus currently known to infect humans.  If clinically indicated additional testing with an alternate test  methodology [phone]) is advised. The SARS-CoV-2 RNA is generally  detectable in upper and lower respiratory sp ecimens during the acute  phase of infection. The expected result is Negative. Fact Sheet for Patients:  StrictlyIdeas.no Fact Sheet for Healthcare Providers: BankingDealers.co.za This test is not yet approved or cleared by the [place] FDA and has been authorized for detection and/or diagnosis of SARS-CoV-2 by FDA under an Emergency Use Authorization (EUA).  This EUA will remain in effect (meaning this test can be used) for the duration of the COVID-19 declaration under Section 564(b)(1) of the Act, 21 U.S.C. section 360bbb-3(b)(1), unless the authorization is terminated or revoked sooner. Performed at [facility], [address] [place], [place] [address]   Surgical pcr screen     Status: None   Collection Time: 10/13/18  1:37 PM  Result Value Ref Range Status   MRSA, PCR  NEGATIVE NEGATIVE Final   Staphylococcus aureus NEGATIVE NEGATIVE Final    Comment: (NOTE) The Xpert SA Assay (FDA approved for NASAL specimens in patients 56 years of age and older), is one component of a comprehensive surveillance program. It is  not intended to diagnose infection nor to guide or monitor treatment. Performed at [facility], [address] [place], [place] [address]   Aerobic/Anaerobic Culture (surgical/deep wound)     Status: None (Preliminary result)   Collection Time: 10/13/18  2:30 PM  Result Value Ref Range Status   Specimen Description   Final    FOOT LEFT FOOT SECOND TOE AMPUTATION Performed at [facility], [address] [place], [place] [address]    Special Requests   Final    NONE Performed at [facility], [place], [place] [address]    Gram Stain   Final    RARE WBC PRESENT, PREDOMINANTLY PMN FEW GRAM POSITIVE COCCI Performed at [facility], [place] [address], [place],  [address]    Culture   Final    RARE ENTEROCOCCUS FAECALIS NO ANAEROBES ISOLATED; CULTURE IN PROGRESS FOR 5 DAYS    Report Status PENDING  Incomplete    RADIOLOGY:  No results found.     Management plans discussed with the patient, family and they are in agreement.  CODE STATUS:     Code Status Orders  (From admission, onward)         Start     Ordered   10/11/18 2236  Full code  Continuous     10/11/18 2235       TOTAL TIME TAKING CARE OF THIS PATIENT: 35 minutes.    Henreitta Leber M.D on 10/16/2018 at 9:09 AM  Between 7am to 6pm - Pager - [phone]  After 6pm go to [email or web address] - password [email or web address] [facility]  Office  [phone]  CC: Primary care physician; [name], MD   Note: This dictation was prepared with Dragon dictation along with smaller phrase technology. Any transcriptional errors that result from this process are unintentional.

## 2018-10-15 NOTE — Progress Notes (Signed)
Jose [name] NAME: Jose [name]    MR#:  [id number]  DATE OF BIRTH:  1946/02/20  SUBJECTIVE:  Patient without complaint, for bilateral lower extremity transmetatarsal amputation by podiatry. Pain under control.  REVIEW OF SYSTEMS:  CONSTITUTIONAL: No fever, fatigue or weakness.  EYES: No blurred or double vision.  EARS, NOSE, AND THROAT: No tinnitus or ear pain.  RESPIRATORY: No cough, shortness of breath, wheezing or hemoptysis.  CARDIOVASCULAR: No chest pain, orthopnea, edema.  GASTROINTESTINAL: No nausea, vomiting, diarrhea or abdominal pain.  GENITOURINARY: No dysuria, hematuria.  ENDOCRINE: No polyuria, nocturia,  HEMATOLOGY: No anemia, easy bruising or bleeding SKIN: No rash or lesion. MUSCULOSKELETAL: No joint pain or arthritis.   NEUROLOGIC: No tingling, numbness, weakness.  PSYCHIATRY: No anxiety or depression.   ROS  DRUG ALLERGIES:   Allergies  Allergen Reactions  . Penicillin G Other (See Comments)    Other reaction(s): Unknown DID THE REACTION INVOLVE: Swelling of the face/tongue/throat, SOB, or low BP? Unknown Sudden or severe rash/hives, skin peeling, or the inside of the mouth or nose? Unknown Did it require medical treatment? Unknown When did it last happen?unknown If all above answers are "NO", may proceed with cephalosporin use.     VITALS:  Blood pressure 113/67, pulse (!) 57, temperature 98.3 F (36.8 C), temperature source Oral, resp. rate 20, height 6\' 1"  (1.854 m), weight 76.5 kg, SpO2 100 %.  PHYSICAL EXAMINATION:  GENERAL:  73 y.o.-year-old patient lying in the bed with no acute distress.  EYES: Pupils equal, round, reactive to light and accommodation. No scleral icterus. Extraocular muscles intact.  HEENT: Head atraumatic, normocephalic. Oropharynx and nasopharynx clear.  NECK:  Supple, no jugular venous distention. No thyroid enlargement, no tenderness.  LUNGS: Normal breath sounds  bilaterally, no wheezing, rales,rhonchi or crepitation. No use of accessory muscles of respiration.  CARDIOVASCULAR: S1, S2 normal. No murmurs, rubs, or gallops.  ABDOMEN: Soft, nontender, nondistended. Bowel sounds present. No organomegaly or mass.  EXTREMITIES: No pedal edema, cyanosis, or clubbing. S/p amputation on right foot. NEUROLOGIC: Cranial nerves II through XII are intact. Muscle strength 4/5 in all extremities. Sensation intact. Gait not checked.  PSYCHIATRIC: The patient is alert and oriented x 3.  SKIN: No obvious rash, lesion, or ulcer.   Physical Exam LABORATORY PANEL:   CBC Recent Labs  Lab 10/12/18 0428  WBC 6.5  HGB 9.9*  HCT 30.6*  PLT 146*   ------------------------------------------------------------------------------------------------------------------  Chemistries  Recent Labs  Lab 10/11/18 1626  10/13/18 1338  NA 142   < > 136  K 3.3*   < > 4.0  CL 107   < > 99  CO2 25   < > 26  GLUCOSE 97   < > 87  BUN 54*   < > 40*  CREATININE 5.97*   < > 5.07*  CALCIUM 8.7*   < > 8.4*  AST 26  --   --   ALT 19  --   --   ALKPHOS 46  --   --   BILITOT 0.5  --   --    < > = values in this interval not displayed.   ------------------------------------------------------------------------------------------------------------------  Cardiac Enzymes No results for input(s): TROPONINI in the last 168 hours. ------------------------------------------------------------------------------------------------------------------  RADIOLOGY:  No results found.  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 Vickki Muff, vascular input appreciated-no further plans for revascularization. Podiatry suggested no further interventions and  keep the dressing dry and change as needed.  PT had suggested rehab placement.  *Acute right lower extremity cellulitis, mild Resolving Continue empiric cefepime/vancomycin for now, follow-up  on cultures  *ESRD Stable Nephrology following for hemodialysis needs  *Chronic diabetes mellitus type 2  Continue to hold oral medication, continue SSI w/ Accu-Cheks per routine  *Hypertension Continue home medications  *Chronic tobacco smoker abuse/dependency  Cessation counseling ordered, nicotine patch daily   *Acute insomnia  Resolved Trazodone at bedtime   Disposition pending clinical course  All the records are reviewed and case discussed with Care Management/Social Workerr. Management plans discussed with the patient, family and they are in agreement.  CODE STATUS: full  TOTAL TIME TAKING CARE OF THIS PATIENT: 33 minutes.    POSSIBLE D/C IN 1-3 DAYS, DEPENDING ON CLINICAL CONDITION.   Vaughan Basta M.D on 10/15/2018   Between 7am to 6pm - Pager - [phone]  After 6pm go to [email or web address] - password [email or web address] [facility]  Office  [phone]  CC: Primary care physician; [name], MD  Note: This dictation was prepared with Dragon dictation along with smaller phrase technology. Any transcriptional errors that result from this process are unintentional.

## 2018-10-15 NOTE — Progress Notes (Signed)
Pharmacy Antibiotic Note  Jose [name] is a 73 y.o. male admitted on 10/11/2018 with wound infection.  Pharmacy has been consulted for Cefepime dosing.  Pt is on HD at home.  Current HD plan for TTS  Plan: Will start Cefepime 1 gm IV Q24H on 5/11 @ 2130.   Vancomycin 1250 mg IV X 1 given in ED on 5/11 @ 2030. Vancomycin 750 mg IV ordered to be given with each HD session.  No trough currently ordered.  Will need to f/u to order trough and schedule vanc doses.   5/15: will check Vanc level prior to HD 5/16.    Height: 6\' 1"  (185.4 cm) Weight: 168 lb 11.2 oz (76.5 kg) IBW/kg (Calculated) : 79.9  Temp (24hrs), Avg:98.5 F (36.9 C), Min:98.1 F (36.7 C), Max:98.8 F (37.1 C)  Recent Labs  Lab 10/11/18 1626 10/11/18 1630 10/12/18 0428 10/13/18 1338  WBC 7.3  --  6.5  --   CREATININE 5.97*  --  6.11* 5.07*  LATICACIDVEN  --  1.1  --   --     Estimated Creatinine Clearance: 14.3 mL/min (A) (by C-G formula based on SCr of 5.07 mg/dL (H)).    Allergies  Allergen Reactions  . Penicillin G Other (See Comments)    Other reaction(s): Unknown DID THE REACTION INVOLVE: Swelling of the face/tongue/throat, SOB, or low BP? Unknown Sudden or severe rash/hives, skin peeling, or the inside of the mouth or nose? Unknown Did it require medical treatment? Unknown When did it last happen?unknown If all above answers are "NO", may proceed with cephalosporin use.     Antimicrobials this admission:   >>    >>   Dose adjustments this admission:  Microbiology results:  BCx:   UCx:    Sputum:    MRSA PCR:   Thank you for allowing pharmacy to be a part of this patient's care.  Alphons Burgert A 10/15/2018 12:53 PM

## 2018-10-15 NOTE — Progress Notes (Signed)
[facility], [place] 10/15/18  Subjective:   LOS: 4 05/14 0701 - 05/15 0700 In: 44 [P.O.:720; IV Piggyback:100] Out: 1000  Patient is doing well today No acute complaints No shortness of breath  Objective:  Vital signs in last 24 hours:  Temp:  [98.1 F (36.7 C)-98.8 F (37.1 C)] 98.3 F (36.8 C) (05/15 1204) Pulse Rate:  [49-62] 57 (05/15 1204) Resp:  [11-20] 20 (05/15 1204) BP: (86-113)/(51-69) 113/67 (05/15 1204) SpO2:  [93 %-100 %] 100 % (05/15 1204) Weight:  [76.5 kg-77 kg] 76.5 kg (05/14 1930)  Weight change:  Filed Weights   10/12/18 0945 10/14/18 1600 10/14/18 1930  Weight: 68.2 kg 77 kg 76.5 kg    Intake/Output:    Intake/Output Summary (Last 24 hours) at 10/15/2018 1509 Last data filed at 10/15/2018 1336 Gross per 24 hour  Intake 1180 ml  Output 1000 ml  Net 180 ml     Physical Exam: General:  No acute distress,  HEENT  anicteric, moist oral mucous membranes  Neck  supple, no masses  Pulm/lungs  normal breathing effort, clear to auscultation  CVS/Heart  irregular  Abdomen:   Soft, nontender  Extremities:  Bilateral foot bandage, 1+ edema bilaterally  Neurologic:  Alert, oriented  Skin:  No acute rashes  Access:  AV fistula       Basic Metabolic Panel:  Recent Labs  Lab 10/11/18 1626 10/12/18 0428 10/13/18 1338  NA 142 138 136  K 3.3* 3.6 4.0  CL 107 103 99  CO2 25 23 26   GLUCOSE 97 116* 87  BUN 54* 58* 40*  CREATININE 5.97* 6.11* 5.07*  CALCIUM 8.7* 8.4* 8.4*     CBC: Recent Labs  Lab 10/11/18 1626 10/12/18 0428  WBC 7.3 6.5  NEUTROABS 4.4  --   HGB 10.0* 9.9*  HCT 30.7* 30.6*  MCV 95.9 95.6  PLT 159 146*      Lab Results  Component Value Date   HEPBSAG Negative 03/22/2018   HEPBSAB Reactive 03/22/2018   HEPBIGM Negative 03/22/2018      Microbiology:  Recent Results (from the past 240 hour(s))  Wound or Superficial Culture     Status: None   Collection Time: 10/11/18  4:26 PM   Result Value Ref Range Status   Specimen Description   Final    FOOT LEFT Performed at [facility], [address] [place], [place] [address]    Special Requests   Final    Immunocompromised Performed at [facility], [address] [place], [place] [address]    Gram Stain   Final    FEW WBC PRESENT, PREDOMINANTLY PMN ABUNDANT GRAM POSITIVE COCCI ABUNDANT GRAM NEGATIVE RODS Performed at [facility], [address], [place], [place] [address]    Culture   Final    MODERATE PROTEUS MIRABILIS FEW MODERATE ENTEROCOCCUS FAECALIS    Report Status 10/15/2018 FINAL  Final   Organism ID, Bacteria PROTEUS MIRABILIS  Final   Organism ID, Bacteria ENTEROCOCCUS FAECALIS  Final      Susceptibility   Enterococcus faecalis - MIC*    AMPICILLIN <=2 SENSITIVE Sensitive     VANCOMYCIN 1 SENSITIVE Sensitive     GENTAMICIN SYNERGY SENSITIVE Sensitive     * FEW MODERATE ENTEROCOCCUS FAECALIS   Proteus mirabilis - MIC*    AMPICILLIN <=2 SENSITIVE Sensitive     CEFAZOLIN <=4 SENSITIVE Sensitive     CEFEPIME <=1 SENSITIVE Sensitive     CEFTAZIDIME <=1  SENSITIVE Sensitive     CEFTRIAXONE <=1 SENSITIVE Sensitive     CIPROFLOXACIN <=0.25 SENSITIVE Sensitive     GENTAMICIN <=1 SENSITIVE Sensitive     IMIPENEM 2 SENSITIVE Sensitive     TRIMETH/SULFA <=20 SENSITIVE Sensitive     AMPICILLIN/SULBACTAM <=2 SENSITIVE Sensitive     PIP/TAZO <=4 SENSITIVE Sensitive     * MODERATE PROTEUS MIRABILIS  SARS Coronavirus 2 (CEPHEID - Performed in [facility] hospital lab), Hosp Order     Status: None   Collection Time: 10/11/18  6:24 PM  Result Value Ref Range Status   SARS Coronavirus 2 NEGATIVE NEGATIVE Final    Comment: (NOTE) If result is NEGATIVE SARS-CoV-2 target nucleic acids are NOT DETECTED. The SARS-CoV-2 RNA is generally detectable in upper and lower  respiratory specimens during the acute phase of infection. The lowest  concentration of SARS-CoV-2 viral copies this  assay can detect is 250  copies / mL. A negative result does not preclude SARS-CoV-2 infection  and should not be used as the sole basis for treatment or other  patient management decisions.  A negative result may occur with  improper specimen collection / handling, submission of specimen other  than nasopharyngeal swab, presence of viral mutation(s) within the  areas targeted by this assay, and inadequate number of viral copies  (<250 copies / mL). A negative result must be combined with clinical  observations, patient history, and epidemiological information. If result is POSITIVE SARS-CoV-2 target nucleic acids are DETECTED. The SARS-CoV-2 RNA is generally detectable in upper and lower  respiratory specimens dur ing the acute phase of infection.  Positive  results are indicative of active infection with SARS-CoV-2.  Clinical  correlation with patient history and other diagnostic information is  necessary to determine patient infection status.  Positive results do  not rule out bacterial infection or co-infection with other viruses. If result is PRESUMPTIVE POSTIVE SARS-CoV-2 nucleic acids MAY BE PRESENT.   A presumptive positive result was obtained on the submitted specimen  and confirmed on repeat testing.  While 2019 novel coronavirus  (SARS-CoV-2) nucleic acids may be present in the submitted sample  additional confirmatory testing may be necessary for epidemiological  and / or clinical management purposes  to differentiate between  SARS-CoV-2 and other Sarbecovirus currently known to infect humans.  If clinically indicated additional testing with an alternate test  methodology [phone]) is advised. The SARS-CoV-2 RNA is generally  detectable in upper and lower respiratory sp ecimens during the acute  phase of infection. The expected result is Negative. Fact Sheet for Patients:  StrictlyIdeas.no Fact Sheet for Healthcare  Providers: BankingDealers.co.za This test is not yet approved or cleared by the [place] FDA and has been authorized for detection and/or diagnosis of SARS-CoV-2 by FDA under an Emergency Use Authorization (EUA).  This EUA will remain in effect (meaning this test can be used) for the duration of the COVID-19 declaration under Section 564(b)(1) of the Act, 21 U.S.C. section 360bbb-3(b)(1), unless the authorization is terminated or revoked sooner. Performed at [facility], [address] [place], [place] [address]   Surgical pcr screen     Status: None   Collection Time: 10/13/18  1:37 PM  Result Value Ref Range Status   MRSA, PCR NEGATIVE NEGATIVE Final   Staphylococcus aureus NEGATIVE NEGATIVE Final    Comment: (NOTE) The Xpert SA Assay (FDA approved for NASAL specimens in patients 8 years of age and older), is one component of a comprehensive surveillance  program. It is not intended to diagnose infection nor to guide or monitor treatment. Performed at [facility], [address] [place], [place] [address]   Aerobic/Anaerobic Culture (surgical/deep wound)     Status: None (Preliminary result)   Collection Time: 10/13/18  2:30 PM  Result Value Ref Range Status   Specimen Description   Final    FOOT LEFT FOOT SECOND TOE AMPUTATION Performed at [facility], [address], [place], [place] [address]    Special Requests   Final    NONE Performed at [facility], [place], [place] [address]    Gram Stain   Final    RARE WBC PRESENT, PREDOMINANTLY PMN FEW GRAM POSITIVE COCCI Performed at [facility], [place] [address] [place], [place] [address]    Culture   Final    RARE ENTEROCOCCUS FAECALIS NO ANAEROBES ISOLATED; CULTURE IN PROGRESS FOR 5 DAYS    Report Status PENDING  Incomplete    Coagulation Studies: No results for input(s): LABPROT, INR in the last 72 hours.  Urinalysis: No  results for input(s): COLORURINE, LABSPEC, PHURINE, GLUCOSEU, HGBUR, BILIRUBINUR, KETONESUR, PROTEINUR, UROBILINOGEN, NITRITE, LEUKOCYTESUR in the last 72 hours.  Invalid input(s): APPERANCEUR    Imaging: No results found.   Medications:   . ceFEPime (MAXIPIME) IV Stopped (10/15/18 0700)  . vancomycin Stopped (10/15/18 0052)   . allopurinol  100 mg Oral Daily  . amLODipine  10 mg Oral Daily  . aspirin EC  81 mg Oral Daily  . atorvastatin  10 mg Oral q1800  . cloNIDine  0.2 mg Oral BID  . clopidogrel  75 mg Oral Daily  . epoetin (EPOGEN/PROCRIT) injection  4,000 Units Intravenous Q T,Th,Sa-HD  . escitalopram  10 mg Oral Daily  . gabapentin  100 mg Oral TID  . heparin  5,000 Units Subcutaneous Q8H  . hydrALAZINE  25 mg Oral Q8H  . insulin aspart  0-5 Units Subcutaneous QHS  . insulin aspart  0-9 Units Subcutaneous TID WC  . multivitamin  1 tablet Oral QHS  . nicotine  21 mg Transdermal Daily  . traZODone  50 mg Oral QHS   docusate sodium, oxyCODONE-acetaminophen  Assessment/ Plan:  73 y.o. male with end-stage renal disease, depression, diabetes with complications, gout, history of stroke, peripheral vascular disease [address] [facility]/TTS 2/TW 70.5 kg  Active Problems:   Gangrene of toe of right foot (HCC)   Ischemic foot -b/l gangrenous toe amputations by Dr. Vickki Muff   -Wound culture growing enterococcus and Proteus.  Currently antibiotics include vancomycin and cefepime  #.  End-stage renal disease -We will continue TTS schedule.  -Continue TTS schedule  Recent Labs    10/11/18 1626 10/12/18 0428 10/13/18 1338  CREATININE 5.97* 6.11* 5.07*    #. Anemia of CKD  Lab Results  Component Value Date   HGB 9.9 (L) 10/12/2018  - schedule Epogen with hemodialysis  #. SHPTH -Monitor phosphorus during hospital admission -No binders noted on home medication list    Component Value Date/Time   PTH 226 (H) 03/25/2018 1557   Lab Results  Component Value Date    PHOS 6.1 (H) 06/17/2018   PHOS 5.7 (H) 06/17/2018    #. Diabetes type 2 with CKD No results found for: HGBA1C. -Maintained with oral hypoglycemics     LOS: [place] 5/15/20203:09 PM  [place], [place]

## 2018-10-15 NOTE — TOC Progression Note (Signed)
Transition of Care [facility]) - Progression Note    Patient Details  Name: Jose [name] MRN: [id number] Date of Birth: 27-Feb-1946  Transition of Care [facility]) CM/SW Contact  [name], RN Phone Number: 10/15/2018, 3:28 PM  Clinical Narrative:    Patient to discharged home today.  RNCM was notified this am that patient was to discharge.  Bed offer for [facility] was presented and patient declined.  RNCM discussed the benefits of SNF, and patient still declines.  Patient states "I have made up my mind".  RNCM notified MD.  RNCM also contacted patient's son Jose [name] and notified him of the decision.  [name] with [facility] notified of discharge and resumption orders  UPDATE: 1530 After working with PT patient agreeable for SNF and has accepted bed at [facility].  CSW has notified [name] at [facility].  They will not be able to accept patient until tomorrow  [name] with [facility] notified. [name] HD liaison notified Discharge plan discussed with son Jose [name] and he is in agreement     Expected Discharge Plan: Skilled Nursing Facility Barriers to Discharge: Barriers Resolved  Expected Discharge Plan and Services Expected Discharge Plan: [place]   Discharge Planning Services: CM Consult   Living arrangements for the past 2 months: Single Family Home Expected Discharge Date: 10/15/18                             Date HH Agency Contacted: 10/12/18 Time HH Agency Contacted: 1006 Representative spoke with at [place]: [place] ([place]) Interventions    Readmission Risk Interventions Readmission Risk Prevention Plan 10/15/2018 10/14/2018 10/12/2018  Transportation Screening Complete Complete -  PCP or Specialist Appt within 3-5 Days (No Data) - -  HRI or [place] - Complete Complete  Social Work Consult for [place] Planning/Counseling - Not Complete -  Palliative Care Screening - Not Applicable -   Medication Review Press photographer) - Complete Complete  Some recent data might be hidden

## 2018-10-15 NOTE — Progress Notes (Signed)
[place] at [place] NAME: Jose [name]    MR#:  [id number]  DATE OF BIRTH:  16-Feb-1946  SUBJECTIVE:  Patient without complaint, for bilateral lower extremity transmetatarsal amputation by podiatry. Had c/o more pain.  REVIEW OF SYSTEMS:  CONSTITUTIONAL: No fever, fatigue or weakness.  EYES: No blurred or double vision.  EARS, NOSE, AND THROAT: No tinnitus or ear pain.  RESPIRATORY: No cough, shortness of breath, wheezing or hemoptysis.  CARDIOVASCULAR: No chest pain, orthopnea, edema.  GASTROINTESTINAL: No nausea, vomiting, diarrhea or abdominal pain.  GENITOURINARY: No dysuria, hematuria.  ENDOCRINE: No polyuria, nocturia,  HEMATOLOGY: No anemia, easy bruising or bleeding SKIN: No rash or lesion. MUSCULOSKELETAL: No joint pain or arthritis.   NEUROLOGIC: No tingling, numbness, weakness.  PSYCHIATRY: No anxiety or depression.   ROS  DRUG ALLERGIES:   Allergies  Allergen Reactions  . Penicillin G Other (See Comments)    Other reaction(s): Unknown DID THE REACTION INVOLVE: Swelling of the face/tongue/throat, SOB, or low BP? Unknown Sudden or severe rash/hives, skin peeling, or the inside of the mouth or nose? Unknown Did it require medical treatment? Unknown When did it last happen?unknown If all above answers are "NO", may proceed with cephalosporin use.     VITALS:  Blood pressure (!) 96/58, pulse (!) 56, temperature 98.1 F (36.7 C), temperature source Oral, resp. rate 17, height 6\' 1"  (1.854 m), weight 76.5 kg, SpO2 93 %.  PHYSICAL EXAMINATION:  GENERAL:  73 y.o.-year-old patient lying in the bed with no acute distress.  EYES: Pupils equal, round, reactive to light and accommodation. No scleral icterus. Extraocular muscles intact.  HEENT: Head atraumatic, normocephalic. Oropharynx and nasopharynx clear.  NECK:  Supple, no jugular venous distention. No thyroid enlargement, no tenderness.  LUNGS: Normal breath sounds  bilaterally, no wheezing, rales,rhonchi or crepitation. No use of accessory muscles of respiration.  CARDIOVASCULAR: S1, S2 normal. No murmurs, rubs, or gallops.  ABDOMEN: Soft, nontender, nondistended. Bowel sounds present. No organomegaly or mass.  EXTREMITIES: No pedal edema, cyanosis, or clubbing. S/p amputation on right foot. NEUROLOGIC: Cranial nerves II through XII are intact. Muscle strength 5/5 in all extremities. Sensation intact. Gait not checked.  PSYCHIATRIC: The patient is alert and oriented x 3.  SKIN: No obvious rash, lesion, or ulcer.   Physical Exam LABORATORY PANEL:   CBC Recent Labs  Lab 10/12/18 0428  WBC 6.5  HGB 9.9*  HCT 30.6*  PLT 146*   ------------------------------------------------------------------------------------------------------------------  Chemistries  Recent Labs  Lab 10/11/18 1626  10/13/18 1338  NA 142   < > 136  K 3.3*   < > 4.0  CL 107   < > 99  CO2 25   < > 26  GLUCOSE 97   < > 87  BUN 54*   < > 40*  CREATININE 5.97*   < > 5.07*  CALCIUM 8.7*   < > 8.4*  AST 26  --   --   ALT 19  --   --   ALKPHOS 46  --   --   BILITOT 0.5  --   --    < > = values in this interval not displayed.   ------------------------------------------------------------------------------------------------------------------  Cardiac Enzymes No results for input(s): TROPONINI in the last 168 hours. ------------------------------------------------------------------------------------------------------------------  RADIOLOGY:  No results found.  ASSESSMENT AND PLAN:  *Acute bilateral lower extremity dry gangrene bilateral TMA by podiatry/Dr. Vickki Muff, vascular input appreciated-no further plans for revascularization  *Acute right lower extremity  cellulitis, mild Resolving Continue empiric cefepime/vancomycin for now, follow-up on cultures  *ESRD Stable Nephrology following for hemodialysis needs  *Chronic diabetes mellitus type 2  Continue to hold  oral medication, continue SSI w/ Accu-Cheks per routine  *Hypertension Continue home medications  *Chronic tobacco smoker abuse/dependency  Cessation counseling ordered, nicotine patch daily   *Acute insomnia  Resolved Trazodone at bedtime   Disposition pending clinical course  All the records are reviewed and case discussed with Care Management/Social Workerr. Management plans discussed with the patient, family and they are in agreement.  CODE STATUS: full  TOTAL TIME TAKING CARE OF THIS PATIENT: 33 minutes.    POSSIBLE D/C IN 1-3 DAYS, DEPENDING ON CLINICAL CONDITION.   Vaughan Basta M.D on 10/15/2018   Between 7am to 6pm - Pager - [phone]  After 6pm go to [email or web address] - password [email or web address] [place] [facility]  Office  [phone]  CC: Primary care physician; [name], MD  Note: This dictation was prepared with Dragon dictation along with smaller phrase technology. Any transcriptional errors that result from this process are unintentional.

## 2018-10-16 LAB — GLUCOSE, CAPILLARY
Glucose-Capillary: 94 mg/dL (ref 70–99)
Glucose-Capillary: 92 mg/dL (ref 70–99)

## 2018-10-16 LAB — VANCOMYCIN, RANDOM: Vancomycin Rm: 10

## 2018-10-16 NOTE — Progress Notes (Signed)
Pharmacy Antibiotic Note  Jose [name] is a 73 y.o. male admitted on 10/11/2018 with wound infection.  Pharmacy has been consulted for Cefepime dosing.  Pt is on HD at home.  Current HD plan for TTS  Plan: Will continue Cefepime 1 gm IV Q24H    Vancomycin 1250 mg IV X 1 given in ED on 5/11 @ 2030. Vancomycin 750 mg IV ordered to be given with each HD session.  Vancomycin random level is 10. Will continue current dose.  Goal trough for wound infection is 10-15.   Plan to discharge 5/16.     Height: 6\' 1"  (185.4 cm) Weight: 175 lb (79.4 kg) IBW/kg (Calculated) : 79.9  Temp (24hrs), Avg:97.9 F (36.6 C), Min:97 F (36.1 C), Max:98.6 F (37 C)  Recent Labs  Lab 10/11/18 1626 10/11/18 1630 10/12/18 0428 10/13/18 1338 10/16/18 0900  WBC 7.3  --  6.5  --   --   CREATININE 5.97*  --  6.11* 5.07*  --   LATICACIDVEN  --  1.1  --   --   --   VANCORANDOM  --   --   --   --  10    Estimated Creatinine Clearance: 14.8 mL/min (A) (by C-G formula based on SCr of 5.07 mg/dL (H)).    Allergies  Allergen Reactions  . Penicillin G Other (See Comments)    Other reaction(s): Unknown DID THE REACTION INVOLVE: Swelling of the face/tongue/throat, SOB, or low BP? Unknown Sudden or severe rash/hives, skin peeling, or the inside of the mouth or nose? Unknown Did it require medical treatment? Unknown When did it last happen?unknown If all above answers are "NO", may proceed with cephalosporin use.     Antimicrobials this admission:   >>    >>   Dose adjustments this admission:  Microbiology results:  BCx:   UCx:    Sputum:    MRSA PCR:   Thank you for allowing pharmacy to be a part of this patient's care.  Oswald Hillock 10/16/2018 10:44 AM

## 2018-10-16 NOTE — Progress Notes (Signed)
Patient back from dialysis, ready for discharge to SNF.  RN unable to reach patient's friend Jose [name] by telephone whom was supposed to transport patient by private vehicle to SNF.  Fortunately, the patient's son Jose [name] happened to call RN for updates.  Son Jose [name] understood that his father was ready for discharge and agreed to transport his father to [facility] SNF today at 15:30.

## 2018-10-16 NOTE — Progress Notes (Signed)
HD START

## 2018-10-16 NOTE — TOC Transition Note (Signed)
Transition of Care [facility]) - CM/SW Discharge Note   Patient Details  Name: SNEIJDER BERNARDS MRN: [id number] Date of Birth: 1945-11-21  Transition of Care [facility]) CM/SW Contact:  Weston Anna, LCSW Phone [phone] 10/16/2018, 10:23 AM   Clinical Narrative:    Patient stable to discharge to [facility] today after dialysis. Patient will need transportation via [facility]. Please call report to [phone]. CSW notified son, [name], and will notify RN once dialysis is completed.    Final next level of care: Skilled Nursing Facility Barriers to Discharge: No Barriers Identified   Patient Goals and CMS Choice Patient states their goals for this hospitalization and ongoing recovery are:: get back home      Discharge Placement              [facility]: [facility] Patient to be transferred to facility by: [place] Name of family member notified: andre- son Patient and family notified of of transfer: 10/16/18  Discharge Plan and Services   Discharge Planning Services: CM Consult                          Date [facility] Agency Contacted: 10/12/18 Time [place]: 1006 Representative spoke with at [place]: [place] ([place]) Interventions     Readmission Risk Interventions Readmission Risk Prevention Plan 10/15/2018 10/14/2018 10/12/2018  Transportation Screening Complete Complete -  PCP or Specialist Appt within 3-5 Days (No Data) - -  HRI or [place] - Complete Complete  Social Work Consult for [place] Planning/Counseling - Not Complete -  Palliative Care Screening - Not Applicable -  Medication Review Press photographer) - Complete Complete  Some recent data might be hidden

## 2018-10-16 NOTE — Progress Notes (Signed)
[name], [place] 10/16/18  Subjective:   LOS: 5 05/15 0701 - 05/16 0700 In: 1400 [P.O.:1200; IV Piggyback:200] Out: -  Patient is doing well today No acute complaints No shortness of breath Patient seen during dialysis Tolerating well    HEMODIALYSIS FLOWSHEET:  Blood Flow Rate (mL/min): 400 mL/min Arterial Pressure (mmHg): -150 mmHg Venous Pressure (mmHg): 170 mmHg Transmembrane Pressure (mmHg): 50 mmHg Ultrafiltration Rate (mL/min): 570 mL/min Dialysate Flow Rate (mL/min): 800 ml/min Conductivity: Machine : 13.8 Conductivity: Machine : 13.8 Dialysis Fluid Bolus: Normal Saline Bolus Amount (mL): 250 mL    Objective:  Vital signs in last 24 hours:  Temp:  [97 F (36.1 C)-99.1 F (37.3 C)] 99.1 F (37.3 C) (05/16 1300) Pulse Rate:  [48-72] 59 (05/16 1311) Resp:  [12-20] 14 (05/16 1311) BP: (103-122)/(59-82) 119/59 (05/16 1311) SpO2:  [94 %-100 %] 98 % (05/16 1311) Weight:  [76.5 kg-79.4 kg] 76.5 kg (05/16 1311)  Weight change:  Filed Weights   10/14/18 1930 10/16/18 0915 10/16/18 1311  Weight: 76.5 kg 79.4 kg 76.5 kg    Intake/Output:    Intake/Output Summary (Last 24 hours) at 10/16/2018 1424 Last data filed at 10/16/2018 1311 Gross per 24 hour  Intake 320 ml  Output 2500 ml  Net -2180 ml     Physical Exam: General:  No acute distress,  HEENT  anicteric, moist oral mucous membranes  Neck  supple, no masses  Pulm/lungs  normal breathing effort, clear to auscultation  CVS/Heart  irregular  Abdomen:   Soft, nontender  Extremities:  Bilateral foot bandage, 1+ edema bilaterally   Neurologic:  Alert, oriented  Skin:  No acute rashes  Access:  AV fistula       Basic Metabolic Panel:  Recent Labs  Lab 10/11/18 1626 10/12/18 0428 10/13/18 1338  NA 142 138 136  K 3.3* 3.6 4.0  CL 107 103 99  CO2 25 23 26   GLUCOSE 97 116* 87  BUN 54* 58* 40*  CREATININE 5.97* 6.11* 5.07*  CALCIUM 8.7* 8.4* 8.4*      CBC: Recent Labs  Lab 10/11/18 1626 10/12/18 0428  WBC 7.3 6.5  NEUTROABS 4.4  --   HGB 10.0* 9.9*  HCT 30.7* 30.6*  MCV 95.9 95.6  PLT 159 146*      Lab Results  Component Value Date   HEPBSAG Negative 03/22/2018   HEPBSAB Reactive 03/22/2018   HEPBIGM Negative 03/22/2018      Microbiology:  Recent Results (from the past 240 hour(s))  Wound or Superficial Culture     Status: None   Collection Time: 10/11/18  4:26 PM  Result Value Ref Range Status   Specimen Description   Final    FOOT LEFT Performed at [facility], [address], [place], [place] [address]    Special Requests   Final    Immunocompromised Performed at [facility], [address] [place], [place] [address]    Gram Stain   Final    FEW WBC PRESENT, PREDOMINANTLY PMN ABUNDANT GRAM POSITIVE COCCI ABUNDANT GRAM NEGATIVE RODS Performed at [name], [address], [place], [place] [address]    Culture   Final    MODERATE PROTEUS MIRABILIS FEW MODERATE ENTEROCOCCUS FAECALIS    Report Status 10/15/2018 FINAL  Final   Organism ID, Bacteria PROTEUS MIRABILIS  Final   Organism ID, Bacteria ENTEROCOCCUS FAECALIS  Final      Susceptibility   Enterococcus faecalis - MIC*  AMPICILLIN <=2 SENSITIVE Sensitive     VANCOMYCIN 1 SENSITIVE Sensitive     GENTAMICIN SYNERGY SENSITIVE Sensitive     * FEW MODERATE ENTEROCOCCUS FAECALIS   Proteus mirabilis - MIC*    AMPICILLIN <=2 SENSITIVE Sensitive     CEFAZOLIN <=4 SENSITIVE Sensitive     CEFEPIME <=1 SENSITIVE Sensitive     CEFTAZIDIME <=1 SENSITIVE Sensitive     CEFTRIAXONE <=1 SENSITIVE Sensitive     CIPROFLOXACIN <=0.25 SENSITIVE Sensitive     GENTAMICIN <=1 SENSITIVE Sensitive     IMIPENEM 2 SENSITIVE Sensitive     TRIMETH/SULFA <=20 SENSITIVE Sensitive     AMPICILLIN/SULBACTAM <=2 SENSITIVE Sensitive     PIP/TAZO <=4 SENSITIVE Sensitive     * MODERATE PROTEUS MIRABILIS  SARS Coronavirus 2 (CEPHEID -  Performed in [name]), Hosp Order     Status: None   Collection Time: 10/11/18  6:24 PM  Result Value Ref Range Status   SARS Coronavirus 2 NEGATIVE NEGATIVE Final    Comment: (NOTE) If result is NEGATIVE SARS-CoV-2 target nucleic acids are NOT DETECTED. The SARS-CoV-2 RNA is generally detectable in upper and lower  respiratory specimens during the acute phase of infection. The lowest  concentration of SARS-CoV-2 viral copies this assay can detect is 250  copies / mL. A negative result does not preclude SARS-CoV-2 infection  and should not be used as the sole basis for treatment or other  patient management decisions.  A negative result may occur with  improper specimen collection / handling, submission of specimen other  than nasopharyngeal swab, presence of viral mutation(s) within the  areas targeted by this assay, and inadequate number of viral copies  (<250 copies / mL). A negative result must be combined with clinical  observations, patient history, and epidemiological information. If result is POSITIVE SARS-CoV-2 target nucleic acids are DETECTED. The SARS-CoV-2 RNA is generally detectable in upper and lower  respiratory specimens dur ing the acute phase of infection.  Positive  results are indicative of active infection with SARS-CoV-2.  Clinical  correlation with patient history and other diagnostic information is  necessary to determine patient infection status.  Positive results do  not rule out bacterial infection or co-infection with other viruses. If result is PRESUMPTIVE POSTIVE SARS-CoV-2 nucleic acids MAY BE PRESENT.   A presumptive positive result was obtained on the submitted specimen  and confirmed on repeat testing.  While 2019 novel coronavirus  (SARS-CoV-2) nucleic acids may be present in the submitted sample  additional confirmatory testing may be necessary for epidemiological  and / or clinical management purposes  to differentiate between   SARS-CoV-2 and other Sarbecovirus currently known to infect humans.  If clinically indicated additional testing with an alternate test  methodology [phone]) is advised. The SARS-CoV-2 RNA is generally  detectable in upper and lower respiratory sp ecimens during the acute  phase of infection. The expected result is Negative. Fact Sheet for Patients:  StrictlyIdeas.no Fact Sheet for Healthcare Providers: BankingDealers.co.za This test is not yet approved or cleared by the [place] FDA and has been authorized for detection and/or diagnosis of SARS-CoV-2 by FDA under an Emergency Use Authorization (EUA).  This EUA will remain in effect (meaning this test can be used) for the duration of the COVID-19 declaration under Section 564(b)(1) of the Act, 21 U.S.C. section 360bbb-3(b)(1), unless the authorization is terminated or revoked sooner. Performed at [name], [address], [place], [place] [address]   Surgical pcr  screen     Status: None   Collection Time: 10/13/18  1:37 PM  Result Value Ref Range Status   MRSA, PCR NEGATIVE NEGATIVE Final   Staphylococcus aureus NEGATIVE NEGATIVE Final    Comment: (NOTE) The Xpert SA Assay (FDA approved for NASAL specimens in patients 45 years of age and older), is one component of a comprehensive surveillance program. It is not intended to diagnose infection nor to guide or monitor treatment. Performed at [facility], [address] [place], [place] [address]   Aerobic/Anaerobic Culture (surgical/deep wound)     Status: None (Preliminary result)   Collection Time: 10/13/18  2:30 PM  Result Value Ref Range Status   Specimen Description FOOT LEFT FOOT SECOND TOE AMPUTATION  Final   Special Requests NONE  Final   Gram Stain   Final    RARE WBC PRESENT, PREDOMINANTLY PMN FEW GRAM POSITIVE COCCI    Culture   Final    RARE ENTEROCOCCUS FAECALIS NO ANAEROBES ISOLATED;  CULTURE IN PROGRESS FOR 5 DAYS    Report Status PENDING  Incomplete   Organism ID, Bacteria ENTEROCOCCUS FAECALIS  Final      Susceptibility   Enterococcus faecalis - MIC*    AMPICILLIN <=2 SENSITIVE Sensitive     VANCOMYCIN 1 SENSITIVE Sensitive     GENTAMICIN SYNERGY Value in next row Sensitive      SENSITIVEPerformed at [facility] [address], [place], [place] [address]    * RARE ENTEROCOCCUS FAECALIS    Coagulation Studies: No results for input(s): LABPROT, INR in the last 72 hours.  Urinalysis: No results for input(s): COLORURINE, LABSPEC, PHURINE, GLUCOSEU, HGBUR, BILIRUBINUR, KETONESUR, PROTEINUR, UROBILINOGEN, NITRITE, LEUKOCYTESUR in the last 72 hours.  Invalid input(s): APPERANCEUR    Imaging: No results found.   Medications:   . ceFEPime (MAXIPIME) IV Stopped (10/15/18 2141)  . vancomycin Stopped (10/16/18 1330)   . allopurinol  100 mg Oral Daily  . amLODipine  10 mg Oral Daily  . aspirin EC  81 mg Oral Daily  . atorvastatin  10 mg Oral q1800  . cloNIDine  0.2 mg Oral BID  . clopidogrel  75 mg Oral Daily  . epoetin (EPOGEN/PROCRIT) injection  4,000 Units Intravenous Q T,Th,Sa-HD  . escitalopram  10 mg Oral Daily  . gabapentin  100 mg Oral TID  . heparin  5,000 Units Subcutaneous Q8H  . hydrALAZINE  25 mg Oral Q8H  . insulin aspart  0-5 Units Subcutaneous QHS  . insulin aspart  0-9 Units Subcutaneous TID WC  . multivitamin  1 tablet Oral QHS  . nicotine  21 mg Transdermal Daily  . traZODone  50 mg Oral QHS   docusate sodium, oxyCODONE-acetaminophen  Assessment/ Plan:  73 y.o. male with end-stage renal disease, depression, diabetes with complications, gout, history of stroke, peripheral vascular disease  [address] [facility]/TTS- 2/TW 70.5 kg  Active Problems:   Gangrene of toe of right foot (HCC)   Ischemic foot -b/l gangrenous toe amputations by Dr. Vickki Muff   -Wound culture growing enterococcus and Proteus. -Antibiotics as per internal  medicine team.  Currently on Augmentin  #.  End-stage renal disease -We will continue TTS schedule.  -Patient is noted to have edema in the legs today.  Ultrafiltration as tolerated  Recent Labs    10/11/18 1626 10/12/18 0428 10/13/18 1338  CREATININE 5.97* 6.11* 5.07*    #. Anemia of CKD  Lab Results  Component Value Date   HGB 9.9 (L)  10/12/2018  -  Epogen with hemodialysis  #. SHPTH -Monitor phosphorus during [name] admission -No binders noted on home medication list    Component Value Date/Time   PTH 226 (H) 03/25/2018 1557   Lab Results  Component Value Date   PHOS 6.1 (H) 06/17/2018   PHOS 5.7 (H) 06/17/2018    #. Diabetes type 2 with CKD No results found for: HGBA1C. -Maintained with oral hypoglycemics  To be discharged to a skilled nursing facility    LOS: [place] 5/16/20202:24 PM  [place], [place]

## 2018-10-16 NOTE — Progress Notes (Signed)
Nursing report called to [facility].  Spoke with Holli Humbles RN who received transfer/discharge report.  RN denied further questions following report.  Patient's son [name] will be transporting patient via private vehicle to facility. Reviewed AVS with patient.  Patient verbalizes understanding that he is to give discharge packet to his son in the car who will handover AVS and packet, including printed and signed scripts, to receiving nurse Latia.

## 2018-10-16 NOTE — Progress Notes (Signed)
Post HD   10/16/18 1311  Hand-Off documentation  Report given to (Full Name) Harlow Asa RN  Vital Signs  Pulse Rate (!) 59  Pulse Rate Source Monitor  Resp 14  BP (!) 119/59  BP Location Right Arm  BP Method Automatic  Patient Position (if appropriate) Lying  Oxygen Therapy  SpO2 98 %  O2 Device Room Air  Pain Assessment  Pain Scale 0-10  Pain Score 9  Pain Location Foot  Dialysis Weight  Weight 76.5 kg  Type of Weight Post-Dialysis  Post-Hemodialysis Assessment  Rinseback Volume (mL) 250 mL  KECN 77 V  Dialyzer Clearance Lightly streaked  Duration of HD Treatment -hour(s) 3.5 hour(s)  Hemodialysis Intake (mL) 500 mL  UF Total -Machine (mL) 3000 mL  Net UF (mL) 2500 mL  Tolerated HD Treatment Yes  Post-Hemodialysis Comments  (tolerated tx)  AVG/AVF Arterial Site Held (minutes) 10 minutes  AVG/AVF Venous Site Held (minutes) 10 minutes  Fistula / Graft Left Forearm Arteriovenous fistula  Placement Date/Time: 06/09/18 1325   Placed prior to admission: No  Orientation: Left  Access Location: Forearm  Access Type: Arteriovenous fistula  Site Condition No complications  Fistula / Graft Assessment Present;Thrill;Bruit  Status Deaccessed  Drainage Description None

## 2018-10-16 NOTE — Progress Notes (Signed)
HD TX END   10/16/18 1300  Vital Signs  Temp 99.1 F (37.3 C)  Temp Source Oral  Pulse Rate 60  Pulse Rate Source Monitor  Resp 14  BP 118/70  BP Location Right Arm  BP Method Automatic  Patient Position (if appropriate) Lying  Oxygen Therapy  SpO2 99 %  O2 Device Room Air  During Hemodialysis Assessment  Dialysis Fluid Bolus Normal Saline  Bolus Amount (mL) 250 mL  Intra-Hemodialysis Comments Tx completed;Tolerated well (3061ml fluid removed)

## 2018-10-16 NOTE — Progress Notes (Signed)
PRE-HD  10/16/18 0915  Vital Signs  Temp (!) 97 F (36.1 C)  Temp Source Oral  Pulse Rate (!) 56  Pulse Rate Source Monitor  Resp 20  BP 107/74  BP Location Right Arm  BP Method Automatic  Patient Position (if appropriate) Lying  Oxygen Therapy  SpO2 94 %  O2 Device Room Air  Pain Assessment  Pain Scale 0-10  Pain Score 4  Pain Location Foot  Pain Orientation Right  Pain Descriptors / Indicators Burning  Dialysis Weight  Weight 79.4 kg  Type of Weight Pre-Dialysis  Time-Out for Hemodialysis  What Procedure? HD  Pt Identifiers(min of two) First/Last Name;MRN/Account#  Correct Site? Yes  Correct Side? Yes  Correct Procedure? Yes  Consents Verified? Yes  Rad Studies Available? N/A  Safety Precautions Reviewed? Yes  Engineer, civil (consulting) Number 4  Station Number 4  UF/Alarm Test Passed  Conductivity: Meter 13.8  Conductivity: Machine  13.8  pH 7.3  Reverse Osmosis main  Normal Saline Lot Number [id number]  Dialyzer Lot Number [id number]  Disposable Set Lot Number [phone] [id number]  Machine Temperature 98.6 F (37 C)  Musician and Audible Yes  Blood Lines Intact and Secured Yes  Pre Treatment Patient Checks  Vascular access used during treatment Fistula  Hepatitis B Surface Antigen Results Negative  Date Hepatitis B Surface Antigen Drawn 03/22/18  Hepatitis B Surface Antibody  (>10)  Date Hepatitis B Surface Antibody Drawn 03/22/18  Hemodialysis Consent Verified Yes  Hemodialysis Standing Orders Initiated Yes  ECG (Telemetry) Monitor On Yes  Prime Ordered Normal Saline  Length of  DialysisTreatment -hour(s) 3.5 Hour(s)  Dialyzer Elisio 17H NR  Dialysate 3K, 2.5 Ca  Variable Sodium  (140)  Dialysate Flow Ordered 800  Blood Flow Rate Ordered 400 mL/min  Ultrafiltration Goal 1.5 Liters  Pre Treatment Labs  (vanc)  Education / Care Plan  Dialysis Education Provided Yes  Documented Education in Care Plan Yes  Fistula / Graft Left Forearm Arteriovenous fistula   Placement Date/Time: 06/09/18 1325   Placed prior to admission: No  Orientation: Left  Access Location: Forearm  Access Type: Arteriovenous fistula  Site Condition No complications  Fistula / Graft Assessment Present;Thrill;Bruit  Status Accessed  Needle Size 15  Drainage Description None

## 2018-10-18 LAB — AEROBIC/ANAEROBIC CULTURE W GRAM STAIN (SURGICAL/DEEP WOUND)

## 2018-10-19 ENCOUNTER — Other Ambulatory Visit (INDEPENDENT_AMBULATORY_CARE_PROVIDER_SITE_OTHER): Payer: Self-pay | Admitting: Nurse Practitioner

## 2018-10-19 DIAGNOSIS — I70261 Atherosclerosis of native arteries of extremities with gangrene, right leg: Secondary | ICD-10-CM

## 2018-11-05 ENCOUNTER — Encounter (INDEPENDENT_AMBULATORY_CARE_PROVIDER_SITE_OTHER): Payer: Self-pay | Admitting: Vascular Surgery

## 2018-11-05 ENCOUNTER — Other Ambulatory Visit: Payer: Self-pay

## 2018-11-05 ENCOUNTER — Ambulatory Visit (INDEPENDENT_AMBULATORY_CARE_PROVIDER_SITE_OTHER): Payer: Medicare Other | Admitting: Vascular Surgery

## 2018-11-05 ENCOUNTER — Ambulatory Visit (INDEPENDENT_AMBULATORY_CARE_PROVIDER_SITE_OTHER): Payer: Medicare Other

## 2018-11-05 VITALS — BP 130/70 | HR 48 | Resp 12 | Ht 73.0 in | Wt 160.0 lb

## 2018-11-05 DIAGNOSIS — I10 Essential (primary) hypertension: Secondary | ICD-10-CM

## 2018-11-05 DIAGNOSIS — N186 End stage renal disease: Secondary | ICD-10-CM | POA: Diagnosis not present

## 2018-11-05 DIAGNOSIS — Z992 Dependence on renal dialysis: Secondary | ICD-10-CM

## 2018-11-05 DIAGNOSIS — Z79899 Other long term (current) drug therapy: Secondary | ICD-10-CM

## 2018-11-05 DIAGNOSIS — E1122 Type 2 diabetes mellitus with diabetic chronic kidney disease: Secondary | ICD-10-CM

## 2018-11-05 DIAGNOSIS — I120 Hypertensive chronic kidney disease with stage 5 chronic kidney disease or end stage renal disease: Secondary | ICD-10-CM | POA: Diagnosis not present

## 2018-11-05 DIAGNOSIS — I70263 Atherosclerosis of native arteries of extremities with gangrene, bilateral legs: Secondary | ICD-10-CM | POA: Diagnosis not present

## 2018-11-05 DIAGNOSIS — Z9862 Peripheral vascular angioplasty status: Secondary | ICD-10-CM

## 2018-11-05 NOTE — Assessment & Plan Note (Signed)
Feet are feeling better after surgery to debride the dead tissue and remove gangrenous material by podiatry.  He still has adequate perfusion distally.  He has a litany of medical issues and what has shown to be very poor cardiac output on his angiograms that limits wound healing.  Diabetes and renal failure also limit wound healing.  Not much further to do from a vascular point of view at this point.  Return to clinic in 3 months.

## 2018-11-05 NOTE — Patient Instructions (Signed)

## 2018-11-05 NOTE — Progress Notes (Signed)
MRN : [id number]  Jose [name] is a 73 y.o. (November 08, 1945) male who presents with chief complaint of  Chief Complaint  Patient presents with   Follow-up  .  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  Since his last visit, he is undergone digital amputations and debridement by podiatry to move dead tissue.  He seems to be doing well from this.  Even at his last angiogram, pretty good flow was seen and some intervention was performed at that time to maximize blood flow.  He has a very poor cardiac output noted on his angiograms and a litany of medical issues as described below.  He is in much less pain.  He looks a lot more comfortable today.  His left arm access is also working well for his dialysis needs.  Current Outpatient Medications  Medication Sig Dispense Refill   allopurinol (ZYLOPRIM) 100 MG tablet Take 100 mg by mouth daily.      amLODipine (NORVASC) 10 MG tablet Take 10 mg by mouth daily.      aspirin EC 81 MG EC tablet Take 1 tablet (81 mg total) by mouth daily.     atorvastatin (LIPITOR) 10 MG tablet Take 1 tablet (10 mg total) by mouth daily at 6 PM. 90 tablet 3   cloNIDine (CATAPRES) 0.2 MG tablet TAKE 1 TABLET BY MOUTH TWICE A DAY (Patient taking differently: Take 0.2 mg by mouth 2 (two) times daily. ) 60 tablet 0   clopidogrel (PLAVIX) 75 MG tablet Take 1 tablet (75 mg total) by mouth daily. 30 tablet 2   docusate sodium (COLACE) 100 MG capsule Take 1 capsule (100 mg total) by mouth 2 (two) times daily as needed for mild constipation. 10 capsule 0   escitalopram (LEXAPRO) 10 MG tablet Take 10 mg by mouth daily.      gabapentin (NEURONTIN) 300 MG capsule Take 1 capsule (300 mg total) by mouth 3 (three) times daily. (Patient taking differently: Take 100 mg by mouth 3 (three) times daily. ) 90 capsule 3   glipiZIDE (GLUCOTROL) 5 MG tablet Take 5 mg by mouth daily before breakfast.      hydrALAZINE  (APRESOLINE) 25 MG tablet Take 1 tablet (25 mg total) by mouth every 8 (eight) hours. 90 tablet 0   oxyCODONE-acetaminophen (PERCOCET/ROXICET) 5-325 MG tablet Take 1-2 tablets by mouth every 6 (six) hours as needed for moderate pain or severe pain. 30 tablet 0   multivitamin (RENA-VIT) TABS tablet Take 1 tablet by mouth at bedtime. (Patient not taking: Reported on 10/11/2018) 30 tablet 0   Nutritional Supplements (FEEDING SUPPLEMENT, NEPRO CARB STEADY,) LIQD Take 237 mLs by mouth 2 (two) times daily between meals. (Patient not taking: Reported on 10/11/2018) 60 Can 0   No current facility-administered medications for this visit.     Past Medical History:  Diagnosis Date   Chronic kidney disease    Depression    Diabetes mellitus without complication (HCC)    Glaucoma    Glaucoma    Gout    Heart murmur    HLD (hyperlipidemia)    Hypertension    Prostate cancer ([place])    Renal failure    dialysis t/t/s   Stroke ([place])    tia x 2    Past Surgical History:  Procedure Laterality Date   AMPUTATION TOE Bilateral 07/23/2018   Procedure: TOE MPJ RIGHT 3RD AND LEFT 2ND;  Surgeon: Samara Deist,  DPM;  Location: [facility];  Service: Podiatry;  Laterality: Bilateral;   APPENDECTOMY     AV FISTULA PLACEMENT Left 06/09/2018   Procedure: ARTERIOVENOUS (AV) FISTULA CREATION ( BRACHIAL CEPHALIC);  Surgeon: Algernon Huxley, MD;  Location: [facility];  Service: Vascular;  Laterality: Left;   CHOLECYSTECTOMY     DIALYSIS/PERMA CATHETER INSERTION N/A 03/24/2018   Procedure: DIALYSIS/PERMA CATHETER INSERTION;  Surgeon: Katha Cabal, MD;  Location: [place] [facility];  Service: Cardiovascular;  Laterality: N/A;   DIALYSIS/PERMA CATHETER REMOVAL N/A 09/06/2018   Procedure: DIALYSIS/PERMA CATHETER REMOVAL;  Surgeon: Algernon Huxley, MD;  Location: [place] [facility];  Service: Cardiovascular;  Laterality: N/A;   INSERTION PROSTATE RADIATION SEED     LOWER EXTREMITY ANGIOGRAPHY Right  06/16/2018   Procedure: LOWER EXTREMITY ANGIOGRAPHY;  Surgeon: Algernon Huxley, MD;  Location: [place] [facility];  Service: Cardiovascular;  Laterality: Right;   LOWER EXTREMITY ANGIOGRAPHY Left 08/30/2018   Procedure: LOWER EXTREMITY ANGIOGRAPHY;  Surgeon: Algernon Huxley, MD;  Location: [place] [facility];  Service: Cardiovascular;  Laterality: Left;   LOWER EXTREMITY ANGIOGRAPHY Right 09/30/2018   Procedure: LOWER EXTREMITY ANGIOGRAPHY;  Surgeon: Algernon Huxley, MD;  Location: [place] [facility];  Service: Cardiovascular;  Laterality: Right;   LOWER EXTREMITY ANGIOGRAPHY Left 10/07/2018   Procedure: LOWER EXTREMITY ANGIOGRAPHY;  Surgeon: Algernon Huxley, MD;  Location: [place] [facility];  Service: Cardiovascular;  Laterality: Left;   TRANSMETATARSAL AMPUTATION Bilateral 10/13/2018   Procedure: 1.  Amputation right great toe MTPJ 2.  Amputation right second toe MTPJ 3.  Amputation right fourth toe MTPJ 4.  Amputation right fifth toe MTPJ 5.  Excision distal second metatarsal head right foot 6.  Excision distal third metatarsal head right foot 7.  Amputation left third toe 8.  Incision and drainage infectionleft 2nd toe amputation site. ;  Surgeon: Samara Deist, DPM;  Location    Social History Social History   Tobacco Use   Smoking status: Current Some Day Smoker    Packs/day: 0.10    Types: Cigarettes   Smokeless tobacco: Never Used  Substance Use Topics   Alcohol use: Not Currently    Alcohol/week: 0.0 standard drinks    Frequency: Never   Drug use: Never    Family History Family History  Problem Relation Age of Onset   Varicose Veins Neg Hx   no bleeding or clotting disorders  Allergies  Allergen Reactions   Penicillin G Other (See Comments)    Other reaction(s): Unknown DID THE REACTION INVOLVE: Swelling of the face/tongue/throat, SOB, or low BP? Unknown Sudden or severe rash/hives, skin peeling, or the inside of the mouth or nose? Unknown Did it require medical  treatment? Unknown When did it last happen?unknown If all above answers are "NO", may proceed with cephalosporin use.    REVIEW OF SYSTEMS(Negative unless checked)  Constitutional: [] ???Weight loss[] ???Fever[] ???Chills Cardiac:[] ???Chest pain[] ???Chest pressure[] ???Palpitations [] ???Shortness of breath when laying flat [] ???Shortness of breath at rest [] ???Shortness of breath with exertion. Vascular: [] ???Pain in legs with walking[] ???Pain in legsat rest[] ???Pain in legs when laying flat [] ???Claudication [] ???Pain in feet when walking [] ???Pain in feet at rest [] ???Pain in feet when laying flat [] ???History of DVT [] ???Phlebitis [] ???Swelling in legs [] ???Varicose veins [x] ???Non-healing ulcers Pulmonary: [] ???Uses home oxygen [] ???Productive cough[] ???Hemoptysis [] ???Wheeze [] ???COPD [] ???Asthma Neurologic: [] ???Dizziness [] ???Blackouts [] ???Seizures [] ???History of stroke [] ???History of TIA[] ???Aphasia [] ???Temporary blindness[] ???Dysphagia [] ???Weaknessor numbness in arms [] ???Weakness or numbnessin legs Musculoskeletal: [x] ???Arthritis [] ???Joint swelling [] ???Joint pain [] ???Low back pain Hematologic:[] ???Easy bruising[] ???Easy bleeding [] ???Hypercoagulable  state [x] ???Anemic [] ???Hepatitis Gastrointestinal:[] ???Blood in stool[] ???Vomiting blood[x] ???Gastroesophageal reflux/heartburn[] ???Abdominal pain Genitourinary: [x] ???Chronic kidney disease [] ???Difficulturination [] ???Frequenturination [] ???Burning with urination[] ???Hematuria Skin: [] ???Rashes [x] ???Ulcers [x] ???Wounds Psychological: [] ???History of anxiety[] ???History of major depression.   Physical Examination  BP 130/70 (BP Location: Right Arm, Patient Position: Sitting, Cuff Size: Small)    Pulse (!) 48    Resp 12    Ht 6\' 1"  (1.854 m)    Wt 160 lb (72.6 kg)    BMI 21.11 kg/m  Gen:  WD/WN, NAD Head:  [place]/AT, No temporalis wasting. Ear/Nose/Throat: Hearing grossly intact, nares w/o erythema or drainage Eyes: Conjunctiva clear. Sclera non-icteric Neck: Supple.  Trachea midline Pulmonary:  Good air movement, no use of accessory muscles.  Cardiac: RRR, no JVD Vascular: good thrill left arm access Vessel Right Left  Radial Palpable Palpable                          PT 1+ Palpable 1+ Palpable  DP Palpable Palpable   Gastrointestinal: soft, non-tender/non-distended.  Musculoskeletal: M/S 5/5 throughout.  No deformity or atrophy. Trace LE edema. Wounds currently dressed Neurologic: Sensation grossly intact in extremities.  Symmetrical.  Speech is fluent.  Psychiatric: Judgment intact, Mood & affect appropriate for pt's clinical situation. Dermatologic: Both feet wounds dressed       Labs Recent Results (from the past 2160 hour(s))  Potassium [facility] vascular lab only)     Status: Abnormal   Collection Time: 08/30/18  9:25 AM  Result Value Ref Range   Potassium [facility] vascular lab) 2.7 (LL) 3.5 - 5.1    Comment: CRITICAL RESULT CALLED TO, READ BACK BY AND VERIFIED WITH: Thermon Leyland, RN AT [phone] 08/30/18 CHJ Performed at [facility], [address] [place], [place] [address]   Glucose, capillary     Status: Abnormal   Collection Time: 08/30/18  9:38 AM  Result Value Ref Range   Glucose-Capillary 166 (H) 70 - 99 mg/dL  Glucose, capillary     Status: Abnormal   Collection Time: 08/30/18 12:22 PM  Result Value Ref Range   Glucose-Capillary 102 (H) 70 - 99 mg/dL  Glucose, capillary     Status: None   Collection Time: 09/30/18  7:25 AM  Result Value Ref Range   Glucose-Capillary 82 70 - 99 mg/dL  Potassium [facility] vascular lab only)     Status: None   Collection Time: 09/30/18  7:28 AM  Result Value Ref Range   Potassium [facility] vascular lab) 3.5 3.5 - 5.1    Comment: Performed at [facility], [address] [place], [place] [address]  Glucose, capillary      Status: Abnormal   Collection Time: 09/30/18  9:39 AM  Result Value Ref Range   Glucose-Capillary 102 (H) 70 - 99 mg/dL  Glucose, capillary     Status: Abnormal   Collection Time: 10/07/18  8:33 AM  Result Value Ref Range   Glucose-Capillary 170 (H) 70 - 99 mg/dL  Potassium [facility] vascular lab only)     Status: Abnormal   Collection Time: 10/07/18  8:42 AM  Result Value Ref Range   Potassium [facility] vascular lab) 3.0 (L) 3.5 - 5.1    Comment: Performed at [facility], [address] [place], [place] [address]  Glucose, capillary     Status: None   Collection Time: 10/07/18 11:42 AM  Result Value Ref Range   Glucose-Capillary 76 70 - 99 mg/dL  Glucose, capillary     Status: None  Collection Time: 10/07/18 12:35 PM  Result Value Ref Range   Glucose-Capillary 74 70 - 99 mg/dL  CBC with Differential/Platelet     Status: Abnormal   Collection Time: 10/11/18  4:26 PM  Result Value Ref Range   WBC 7.3 4.0 - 10.5 K/uL   RBC 3.20 (L) 4.22 - 5.81 MIL/uL   Hemoglobin 10.0 (L) 13.0 - 17.0 g/dL   HCT 30.7 (L) 39.0 - 52.0 %   MCV 95.9 80.0 - 100.0 fL   MCH 31.3 26.0 - 34.0 pg   MCHC 32.6 30.0 - 36.0 g/dL   RDW 18.9 (H) 11.5 - 15.5 %   Platelets 159 150 - 400 K/uL   nRBC 0.0 0.0 - 0.2 %   Neutrophils Relative % 60 %   Neutro Abs 4.4 1.7 - 7.7 K/uL   Lymphocytes Relative 22 %   Lymphs Abs 1.6 0.7 - 4.0 K/uL   Monocytes Relative 11 %   Monocytes Absolute 0.8 0.1 - 1.0 K/uL   Eosinophils Relative 5 %   Eosinophils Absolute 0.3 0.0 - 0.5 K/uL   Basophils Relative 1 %   Basophils Absolute 0.1 0.0 - 0.1 K/uL   Immature Granulocytes 1 %   Abs Immature Granulocytes 0.04 0.00 - 0.07 K/uL    Comment: Performed at [facility], [address] [place], [place] [address]  Comprehensive metabolic panel     Status: Abnormal   Collection Time: 10/11/18  4:26 PM  Result Value Ref Range   Sodium 142 135 - 145 mmol/L   Potassium 3.3 (L) 3.5 - 5.1 mmol/L   Chloride 107 98 -  111 mmol/L   CO2 25 22 - 32 mmol/L   Glucose, Bld 97 70 - 99 mg/dL   BUN 54 (H) 8 - 23 mg/dL   Creatinine, Ser 5.97 (H) 0.61 - 1.24 mg/dL   Calcium 8.7 (L) 8.9 - 10.3 mg/dL   Total Protein 7.2 6.5 - 8.1 g/dL   Albumin 3.1 (L) 3.5 - 5.0 g/dL   AST 26 15 - 41 U/L   ALT 19 0 - 44 U/L   Alkaline Phosphatase 46 38 - 126 U/L   Total Bilirubin 0.5 0.3 - 1.2 mg/dL   GFR calc non Af Amer 9 (L) >60 mL/min   GFR calc Af Amer 10 (L) >60 mL/min   Anion gap 10 5 - 15    Comment: Performed at [facility], [address] [place], [place] [address]  Wound or Superficial Culture     Status: None   Collection Time: 10/11/18  4:26 PM  Result Value Ref Range   Specimen Description      FOOT LEFT Performed at [facility], [address], [place], [place] [address]    Special Requests      Immunocompromised Performed at [facility], [address] [place], [place] [address]    Gram Stain      FEW WBC PRESENT, PREDOMINANTLY PMN ABUNDANT GRAM POSITIVE COCCI ABUNDANT GRAM NEGATIVE RODS Performed at [facility], [address], [place], [place] [address]    Culture      MODERATE PROTEUS MIRABILIS FEW MODERATE ENTEROCOCCUS FAECALIS    Report Status 10/15/2018 FINAL    Organism ID, Bacteria PROTEUS MIRABILIS    Organism ID, Bacteria ENTEROCOCCUS FAECALIS       Susceptibility   Enterococcus faecalis - MIC*    AMPICILLIN <=2 SENSITIVE Sensitive     VANCOMYCIN 1 SENSITIVE Sensitive  GENTAMICIN SYNERGY SENSITIVE Sensitive     * FEW MODERATE ENTEROCOCCUS FAECALIS   Proteus mirabilis - MIC*    AMPICILLIN <=2 SENSITIVE Sensitive     CEFAZOLIN <=4 SENSITIVE Sensitive     CEFEPIME <=1 SENSITIVE Sensitive     CEFTAZIDIME <=1 SENSITIVE Sensitive     CEFTRIAXONE <=1 SENSITIVE Sensitive     CIPROFLOXACIN <=0.25 SENSITIVE Sensitive     GENTAMICIN <=1 SENSITIVE Sensitive     IMIPENEM 2 SENSITIVE Sensitive     TRIMETH/SULFA <=20 SENSITIVE Sensitive      AMPICILLIN/SULBACTAM <=2 SENSITIVE Sensitive     PIP/TAZO <=4 SENSITIVE Sensitive     * MODERATE PROTEUS MIRABILIS  Lactic acid, plasma     Status: None   Collection Time: 10/11/18  4:30 PM  Result Value Ref Range   Lactic Acid, Venous 1.1 0.5 - 1.9 mmol/L    Comment: Performed at [facility], [address], [place], [place] [address]  SARS Coronavirus 2 (CEPHEID - Performed in [place] hospital lab), Hosp Order     Status: None   Collection Time: 10/11/18  6:24 PM  Result Value Ref Range   SARS Coronavirus 2 NEGATIVE NEGATIVE    Comment: (NOTE) If result is NEGATIVE SARS-CoV-2 target nucleic acids are NOT DETECTED. The SARS-CoV-2 RNA is generally detectable in upper and lower  respiratory specimens during the acute phase of infection. The lowest  concentration of SARS-CoV-2 viral copies this assay can detect is 250  copies / mL. A negative result does not preclude SARS-CoV-2 infection  and should not be used as the sole basis for treatment or other  patient management decisions.  A negative result may occur with  improper specimen collection / handling, submission of specimen other  than nasopharyngeal swab, presence of viral mutation(s) within the  areas targeted by this assay, and inadequate number of viral copies  (<250 copies / mL). A negative result must be combined with clinical  observations, patient history, and epidemiological information. If result is POSITIVE SARS-CoV-2 target nucleic acids are DETECTED. The SARS-CoV-2 RNA is generally detectable in upper and lower  respiratory specimens dur ing the acute phase of infection.  Positive  results are indicative of active infection with SARS-CoV-2.  Clinical  correlation with patient history and other diagnostic information is  necessary to determine patient infection status.  Positive results do  not rule out bacterial infection or co-infection with other viruses. If result is PRESUMPTIVE POSTIVE SARS-CoV-2  nucleic acids MAY BE PRESENT.   A presumptive positive result was obtained on the submitted specimen  and confirmed on repeat testing.  While 2019 novel coronavirus  (SARS-CoV-2) nucleic acids may be present in the submitted sample  additional confirmatory testing may be necessary for epidemiological  and / or clinical management purposes  to differentiate between  SARS-CoV-2 and other Sarbecovirus currently known to infect humans.  If clinically indicated additional testing with an alternate test  methodology [id number]) is advised. The SARS-CoV-2 RNA is generally  detectable in upper and lower respiratory sp ecimens during the acute  phase of infection. The expected result is Negative. Fact Sheet for Patients:  StrictlyIdeas.no Fact Sheet for Healthcare Providers: BankingDealers.co.za This test is not yet approved or cleared by the [place] FDA and has been authorized for detection and/or diagnosis of SARS-CoV-2 by FDA under an Emergency Use Authorization (EUA).  This EUA will remain in effect (meaning this test can be used) for the duration of the COVID-19 declaration under Section 564(b)(1)  of the Act, 21 U.S.C. section 360bbb-3(b)(1), unless the authorization is terminated or revoked sooner. Performed at [facility], [address] [place], [place] [address]   Glucose, capillary     Status: None   Collection Time: 10/12/18 12:01 AM  Result Value Ref Range   Glucose-Capillary 96 70 - 99 mg/dL  Basic metabolic panel     Status: Abnormal   Collection Time: 10/12/18  4:28 AM  Result Value Ref Range   Sodium 138 135 - 145 mmol/L   Potassium 3.6 3.5 - 5.1 mmol/L   Chloride 103 98 - 111 mmol/L   CO2 23 22 - 32 mmol/L   Glucose, Bld 116 (H) 70 - 99 mg/dL   BUN 58 (H) 8 - 23 mg/dL   Creatinine, Ser 6.11 (H) 0.61 - 1.24 mg/dL   Calcium 8.4 (L) 8.9 - 10.3 mg/dL   GFR calc non Af Amer 8 (L) >60 mL/min   GFR calc Af Amer  10 (L) >60 mL/min   Anion gap 12 5 - 15    Comment: Performed at [facility], [address] [place], [place] [address]  CBC     Status: Abnormal   Collection Time: 10/12/18  4:28 AM  Result Value Ref Range   WBC 6.5 4.0 - 10.5 K/uL   RBC 3.20 (L) 4.22 - 5.81 MIL/uL   Hemoglobin 9.9 (L) 13.0 - 17.0 g/dL   HCT 30.6 (L) 39.0 - 52.0 %   MCV 95.6 80.0 - 100.0 fL   MCH 30.9 26.0 - 34.0 pg   MCHC 32.4 30.0 - 36.0 g/dL   RDW 19.0 (H) 11.5 - 15.5 %   Platelets 146 (L) 150 - 400 K/uL   nRBC 0.0 0.0 - 0.2 %    Comment: Performed at [facility], [address] [place], [place] [address]  Glucose, capillary     Status: Abnormal   Collection Time: 10/12/18  8:13 AM  Result Value Ref Range   Glucose-Capillary 109 (H) 70 - 99 mg/dL  Glucose, capillary     Status: Abnormal   Collection Time: 10/12/18  2:33 PM  Result Value Ref Range   Glucose-Capillary 103 (H) 70 - 99 mg/dL   Comment 1 Notify RN   Glucose, capillary     Status: Abnormal   Collection Time: 10/12/18  4:27 PM  Result Value Ref Range   Glucose-Capillary 106 (H) 70 - 99 mg/dL  Glucose, capillary     Status: Abnormal   Collection Time: 10/12/18  9:52 PM  Result Value Ref Range   Glucose-Capillary 112 (H) 70 - 99 mg/dL  Glucose, capillary     Status: Abnormal   Collection Time: 10/13/18  7:40 AM  Result Value Ref Range   Glucose-Capillary 69 (L) 70 - 99 mg/dL   Comment 1 Notify RN   Glucose, capillary     Status: Abnormal   Collection Time: 10/13/18  8:18 AM  Result Value Ref Range   Glucose-Capillary 148 (H) 70 - 99 mg/dL  Glucose, capillary     Status: None   Collection Time: 10/13/18 11:47 AM  Result Value Ref Range   Glucose-Capillary 79 70 - 99 mg/dL   Comment 1 Notify RN   Surgical pcr screen     Status: None   Collection Time: 10/13/18  1:37 PM  Result Value Ref Range   MRSA, PCR NEGATIVE NEGATIVE   Staphylococcus aureus NEGATIVE NEGATIVE    Comment: (NOTE) The Xpert SA Assay (FDA  approved for NASAL specimens in patients 20 years of age and older), is one component of a comprehensive surveillance program. It is not intended to diagnose infection nor to guide or monitor treatment. Performed at [facility], [address] [place], [place] [address]   Basic metabolic panel     Status: Abnormal   Collection Time: 10/13/18  1:38 PM  Result Value Ref Range   Sodium 136 135 - 145 mmol/L   Potassium 4.0 3.5 - 5.1 mmol/L   Chloride 99 98 - 111 mmol/L   CO2 26 22 - 32 mmol/L   Glucose, Bld 87 70 - 99 mg/dL   BUN 40 (H) 8 - 23 mg/dL   Creatinine, Ser 5.07 (H) 0.61 - 1.24 mg/dL   Calcium 8.4 (L) 8.9 - 10.3 mg/dL   GFR calc non Af Amer 11 (L) >60 mL/min   GFR calc Af Amer 12 (L) >60 mL/min   Anion gap 11 5 - 15    Comment: Performed at [facility], [address] [place], [place] [address]  Glucose, capillary     Status: None   Collection Time: 10/13/18  1:44 PM  Result Value Ref Range   Glucose-Capillary 75 70 - 99 mg/dL  Aerobic/Anaerobic Culture (surgical/deep wound)     Status: None   Collection Time: 10/13/18  2:30 PM  Result Value Ref Range   Specimen Description      FOOT LEFT FOOT SECOND TOE AMPUTATION Performed at [facility], [address], [place], [place] [address]    Special Requests      NONE Performed at [facility], [address] [place], [place] [address]    Gram Stain      RARE WBC PRESENT, PREDOMINANTLY PMN FEW GRAM POSITIVE COCCI    Culture      RARE ENTEROCOCCUS FAECALIS NO ANAEROBES ISOLATED Performed at [facility], [address] [place], [place] [address]    Report Status 10/18/2018 FINAL    Organism ID, Bacteria ENTEROCOCCUS FAECALIS       Susceptibility   Enterococcus faecalis - MIC*    AMPICILLIN <=2 SENSITIVE Sensitive     VANCOMYCIN 1 SENSITIVE Sensitive     GENTAMICIN SYNERGY SENSITIVE Sensitive     * RARE ENTEROCOCCUS FAECALIS  Surgical pathology     Status: None     Collection Time: 10/13/18  2:32 PM  Result Value Ref Range   SURGICAL PATHOLOGY      Surgical Pathology CASE: [id number] PATIENT: Jose [name] Surgical Pathology Report     SPECIMEN SUBMITTED: A. 3rd toe, left; amputation B. 2nd, 4th, great, 5th toe, 2nd and 3rd metatarsal head, right foot  CLINICAL HISTORY: None provided  PRE-OPERATIVE DIAGNOSIS: Gangrene  POST-OPERATIVE DIAGNOSIS: Same as pre-op     DIAGNOSIS: A.  TOE, LEFT THIRD; AMPUTATION: - SKIN AND SOFT TISSUE CHRONIC ULCERATION WITH UNDERLYING OSTEOMYELITIS. - VIABLE SOFT TISSUE AND ARTICULAR BONE SURFACE AT MARGIN OF RESECTION.  B.  TOES, RIGHT GREAT (FIRST), SECOND, FOURTH, AND FIFTH WITH SECOND AND THIRD METATARSAL HEADS; AMPUTATION: - STATUS POST AMPUTATION OF THIRD DIGIT. - SOFT TISSUE NECROSIS WITH UNDERLYING OSTEOMYELITIS INVOLVING FIRST AND SECOND DIGITS. - VIABLE SOFT TISSUE AND ARTICULAR SURFACE OF BONE AT RESECTION MARGINS.  GROSS DESCRIPTION: A. Labeled: Left foot third toe Received: Formalin Tissue fragment(s): 1 Size: 4.8 x 4.2 x 2.3 cm Description: Rec eived is a digit amputation specimen.  The surgical resection margin consists of skin, soft tissue, and bone.  The  skin/soft tissue circumferential resection margin is inked blue.  The most proximal aspect of bone (articular surface) is inked black.  The skin surface displays a 4.3 x 3.3 cm brown-black, necrotic lesion.  The lesion abuts the skin/soft tissue resection margin (suspicious for not being viable).  The remaining skin surface is brown with skin sloughing. The toenail is absent.  No additional abnormalities are grossly identified.  Representative sections are submitted following decalcification as follows: 1-2 - skin lesion and underlying bone in relation to skin/soft tissue resection margin 3 - perpendicular sections in relation to most proximal aspect of bone (entire, trisected)  B. Labeled: Amputation - great toe,  second toe, fourth toe, fifth toe, second and third metatarsal head, right foot Received: Formalin Tissue fragment(s): 2; designated as amput ation #1 and #2 Size: Amputation #1 - 6.8 x 6.7 x 2.5 cm.  Amputation #2 - 6.1 x 3.9 x 2.1 cm. Description: Amputation #1 - Received are 2 attached digit amputations of which grossly appear to consist of the first and second toe.  The surgical resection margin grossly appears viable and consists of skin, soft tissue, and bone.  The skin/soft tissue circumferential resection margin is inked blue.  The most proximal aspect of bone involving each digit displays an articular surface and is inked black.  The skin surface involving the first toe displays a 4.5 x 3.1 cm area of black discoloration and induration, 4.0 cm from the skin resection margin. The skin surface involving the second digit displays a 3.5 x 2.5 cm area of black discoloration and induration, 1.1 cm from the skin resection margin.  The skin surface lateral to the second digit displays a 2.2 x 0.8 cm tan-brown, ulcerative and necrotic lesion, 0.2 cm from the skin resection margin.  The remaining skin surface i s brown with sloughing. The toenails display yellow discoloration and thickening.  No additional abnormalities are grossly identified.  Amputation #2 - Received are 2 attached digit amputations of which grossly appear to consist of the fourth and fifth toe.  The surgical resection margin grossly appears viable and consists of skin, soft tissue, and bone.  The skin/soft tissue circumferential resection margin is inked blue.  The most proximal aspect of bone involving each digit displays an articular surface and is inked black.  The skin surface of each digit is tan-brown with minimal skin sloughing.  No distinct skin lesions are grossly identified.  The toenails display yellow discoloration and thickening.  No additional abnormalities are grossly identified.  Representative  sections are submitted following decalcification as follows: 1 - skin lesion and underlying bone involving first digit (amputation #1) 2 - skin lesion and underlying bone involving second digit (am putation #1) 3 - skin lesion involving skin lateral to second digit in relation to skin/soft tissue resection margin (amputation #1) 4 - perpendicular sections in relation to first digit most proximal aspect of bone (amputation #1) 5 - perpendicular sections in relation to second digit most proximal aspect of bone (amputation #1) 6 - fourth digit displaying grossly unremarkable skin and bone in relation to skin/soft tissue resection margin and most proximal aspect of bone (amputation #2) 7 - fifth digit displaying grossly unremarkable skin and bone in relation to skin/soft tissue resection margin and most proximal aspect of bone (amputation #2)   Final Diagnosis performed by Quay Burow, MD.   Electronically signed 10/15/2018 9:52:03AM The electronic signature indicates that the named Attending Pathologist has evaluated the specimen  Technical component performed at [facility],  [address], [place], [place] [address] Lab: [phone] Dir: Rush Farmer, MD, MMM  Prof essional component performed at [facility], [facility], [place], [place], [place] [address] Lab: [phone] Dir: Dellia Nims. Rubinas, MD   Glucose, capillary     Status: None   Collection Time: 10/13/18  3:49 PM  Result Value Ref Range   Glucose-Capillary 78 70 - 99 mg/dL  Glucose, capillary     Status: None   Collection Time: 10/13/18  4:35 PM  Result Value Ref Range   Glucose-Capillary 74 70 - 99 mg/dL   Comment 1 Notify RN   Glucose, capillary     Status: Abnormal   Collection Time: 10/13/18  9:23 PM  Result Value Ref Range   Glucose-Capillary 134 (H) 70 - 99 mg/dL  Glucose, capillary     Status: None   Collection Time: 10/14/18  7:55 AM  Result Value Ref Range   Glucose-Capillary 77 70 -  99 mg/dL  Glucose, capillary     Status: Abnormal   Collection Time: 10/14/18 11:45 AM  Result Value Ref Range   Glucose-Capillary 176 (H) 70 - 99 mg/dL  Glucose, capillary     Status: Abnormal   Collection Time: 10/14/18  9:34 PM  Result Value Ref Range   Glucose-Capillary 193 (H) 70 - 99 mg/dL  Glucose, capillary     Status: Abnormal   Collection Time: 10/15/18  7:31 AM  Result Value Ref Range   Glucose-Capillary 101 (H) 70 - 99 mg/dL  Glucose, capillary     Status: Abnormal   Collection Time: 10/15/18 12:01 PM  Result Value Ref Range   Glucose-Capillary 147 (H) 70 - 99 mg/dL  Glucose, capillary     Status: Abnormal   Collection Time: 10/15/18  4:36 PM  Result Value Ref Range   Glucose-Capillary 109 (H) 70 - 99 mg/dL  Glucose, capillary     Status: Abnormal   Collection Time: 10/15/18  9:19 PM  Result Value Ref Range   Glucose-Capillary 150 (H) 70 - 99 mg/dL  Glucose, capillary     Status: None   Collection Time: 10/16/18  7:45 AM  Result Value Ref Range   Glucose-Capillary 94 70 - 99 mg/dL  Vancomycin, random     Status: None   Collection Time: 10/16/18  9:00 AM  Result Value Ref Range   Vancomycin Rm 10     Comment:        Random Vancomycin therapeutic range is dependent on dosage and time of specimen collection. A peak range is 20.0-40.0 ug/mL A trough range is 5.0-15.0 ug/mL        Performed at [facility], [place], [place] [address]   Glucose, capillary     Status: None   Collection Time: 10/16/18  1:46 PM  Result Value Ref Range   Glucose-Capillary 92 70 - 99 mg/dL    Radiology Dg Foot Complete Left  Result Date: 10/11/2018 CLINICAL DATA:  Diabetic patient with a burning sensation in the left toes. 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 EXAM: LEFT FOOT - COMPLETE 3+ VIEW COMPARISON:  None. FINDINGS: No acute bony or joint abnormality is identified. No bony destructive change or periosteal reaction is seen. The second toe has  been amputated. No soft tissue gas or radiopaque foreign body is noted. Soft tissues of the foot are diffusely swollen. IMPRESSION: Diffuse soft tissue swelling without underlying acute bony or joint abnormality. Status post amputation of the second toe. Electronically  Signed   By: Inge Rise M.D.   On: 10/11/2018 17:24    Assessment/Plan Diabetes mellitus without complication (HCC) Likely an underlying cause of his renal failure andblood glucose control important in reducing the progression of atherosclerotic disease. Also, involved in wound healing. On appropriate medications.   Hypertension Likely an underlying cause of his renal failure andblood pressure control important in reducing the progression of atherosclerotic disease. On appropriate oral medications.  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 Catheter is out.  Atherosclerosis of native arteries of the extremities with gangrene ([place]) Feet are feeling better after surgery to debride the dead tissue and remove gangrenous material by podiatry.  He still has adequate perfusion distally.  He has a litany of medical issues and what has shown to be very poor cardiac output on his angiograms that limits wound healing.  Diabetes and renal failure also limit wound healing.  Not much further to do from a vascular point of view at this point.  Return to clinic in 3 months.    Leotis Pain, MD  11/05/2018 3:25 PM    This note was created with Dragon medical transcription system.  Any errors from dictation are purely unintentional

## 2018-11-15 ENCOUNTER — Telehealth (INDEPENDENT_AMBULATORY_CARE_PROVIDER_SITE_OTHER): Payer: Self-pay | Admitting: Nurse Practitioner

## 2018-11-15 NOTE — Telephone Encounter (Signed)
Jose [name] with [facility] home [facility] calling requesting pain medication for patient due to recent (several) toe amputation.  Patient had surgery on 10/13/18 by Dr. Vickki Muff. I advised Jose [name] that the doc that did the surgery would have to send in pain meds. She verbalized understanding. AS, CMA

## 2018-12-01 ENCOUNTER — Telehealth (INDEPENDENT_AMBULATORY_CARE_PROVIDER_SITE_OTHER): Payer: Self-pay | Admitting: Nurse Practitioner

## 2018-12-01 NOTE — Telephone Encounter (Signed)
Tammy left a message on patient son voicemail

## 2018-12-12 ENCOUNTER — Emergency Department
Admission: EM | Admit: 2018-12-12 | Discharge: 2018-12-12 | Disposition: A | Discharge status: Home / Self Care | Payer: Medicare Other | Source: Home / Self Care | Attending: Emergency Medicine | Admitting: Emergency Medicine

## 2018-12-12 ENCOUNTER — Encounter: Payer: Self-pay | Admitting: Emergency Medicine

## 2018-12-12 ENCOUNTER — Other Ambulatory Visit: Payer: Self-pay

## 2018-12-12 DIAGNOSIS — T8131XA Disruption of external operation (surgical) wound, not elsewhere classified, initial encounter: Secondary | ICD-10-CM

## 2018-12-12 DIAGNOSIS — F17210 Nicotine dependence, cigarettes, uncomplicated: Secondary | ICD-10-CM | POA: Insufficient documentation

## 2018-12-12 DIAGNOSIS — Z79899 Other long term (current) drug therapy: Secondary | ICD-10-CM | POA: Insufficient documentation

## 2018-12-12 DIAGNOSIS — Z7984 Long term (current) use of oral hypoglycemic drugs: Secondary | ICD-10-CM | POA: Insufficient documentation

## 2018-12-12 DIAGNOSIS — Z7901 Long term (current) use of anticoagulants: Secondary | ICD-10-CM | POA: Insufficient documentation

## 2018-12-12 DIAGNOSIS — Z8546 Personal history of malignant neoplasm of prostate: Secondary | ICD-10-CM | POA: Insufficient documentation

## 2018-12-12 DIAGNOSIS — I120 Hypertensive chronic kidney disease with stage 5 chronic kidney disease or end stage renal disease: Secondary | ICD-10-CM | POA: Insufficient documentation

## 2018-12-12 DIAGNOSIS — T8753 Necrosis of amputation stump, right lower extremity: Secondary | ICD-10-CM | POA: Diagnosis not present

## 2018-12-12 DIAGNOSIS — Y658 Other specified misadventures during surgical and medical care: Secondary | ICD-10-CM | POA: Insufficient documentation

## 2018-12-12 DIAGNOSIS — Z7982 Long term (current) use of aspirin: Secondary | ICD-10-CM | POA: Insufficient documentation

## 2018-12-12 DIAGNOSIS — N186 End stage renal disease: Secondary | ICD-10-CM | POA: Insufficient documentation

## 2018-12-12 DIAGNOSIS — E1122 Type 2 diabetes mellitus with diabetic chronic kidney disease: Secondary | ICD-10-CM | POA: Insufficient documentation

## 2018-12-12 DIAGNOSIS — Z992 Dependence on renal dialysis: Secondary | ICD-10-CM | POA: Insufficient documentation

## 2018-12-12 LAB — BASIC METABOLIC PANEL
Anion gap: 12 (ref 5–15)
BUN: 34 mg/dL — ABNORMAL HIGH (ref 8–23)
CO2: 30 mmol/L (ref 22–32)
Calcium: 8.9 mg/dL (ref 8.9–10.3)
Chloride: 96 mmol/L — ABNORMAL LOW (ref 98–111)
Creatinine, Ser: 4.14 mg/dL — ABNORMAL HIGH (ref 0.61–1.24)
GFR calc Af Amer: 16 mL/min — ABNORMAL LOW (ref >60)
GFR calc non Af Amer: 13 mL/min — ABNORMAL LOW (ref >60)
Glucose, Bld: 151 mg/dL — ABNORMAL HIGH (ref 70–99)
Potassium: 3.9 mmol/L (ref 3.5–5.1)
Sodium: 138 mmol/L (ref 135–145)

## 2018-12-12 LAB — CBC WITH DIFFERENTIAL/PLATELET
Abs Immature Granulocytes: 0.03 10*3/uL (ref 0.00–0.07)
Basophils Absolute: 0.1 10*3/uL (ref 0.0–0.1)
Basophils Relative: 1 %
Eosinophils Absolute: 0.3 10*3/uL (ref 0.0–0.5)
Eosinophils Relative: 4 %
HCT: 37 % — ABNORMAL LOW (ref 39.0–52.0)
Hemoglobin: 11.9 g/dL — ABNORMAL LOW (ref 13.0–17.0)
Immature Granulocytes: 0 %
Lymphocytes Relative: 25 %
Lymphs Abs: 1.7 10*3/uL (ref 0.7–4.0)
MCH: 32.5 pg (ref 26.0–34.0)
MCHC: 32.2 g/dL (ref 30.0–36.0)
MCV: 101.1 fL — ABNORMAL HIGH (ref 80.0–100.0)
Monocytes Absolute: 0.6 10*3/uL (ref 0.1–1.0)
Monocytes Relative: 9 %
Neutro Abs: 4.3 10*3/uL (ref 1.7–7.7)
Neutrophils Relative %: 61 %
Platelets: 171 10*3/uL (ref 150–400)
RBC: 3.66 MIL/uL — ABNORMAL LOW (ref 4.22–5.81)
RDW: 17.2 % — ABNORMAL HIGH (ref 11.5–15.5)
WBC: 7 10*3/uL (ref 4.0–10.5)
nRBC: 0 % (ref 0.0–0.2)

## 2018-12-12 NOTE — Discharge Instructions (Addendum)
Call Dr. Alvera Singh office tomorrow, he can see you tomorrow or Tuesday for a reevaluation of the foot wound.  Continue your usual dialysis schedule.

## 2018-12-12 NOTE — ED Provider Notes (Signed)
[facility] Emergency Department Provider Note   ____________________________________________   First MD Initiated Contact with Patient 12/12/18 [phone]     (approximate)  I have reviewed the triage vital signs and the nursing notes.   HISTORY  Chief Complaint Post-op Problem    HPI Jose [name] is a 73 y.o. male history of end-stage renal disease, diabetes previous lower extremity toe amputations  Patient reports that 2 days ago he started noticing some small amount of bleeding from the tip of his right foot amputation.  It since stopped bleeding but he reports skin seems like it slightly open at the tip.  He is not seen any bloody or yellow drainage except for some slight bleeding that occurred yesterday.  He went to dialysis yesterday had a normal session  He follows with Dr. Vickki Muff podiatry.  He has not had any fevers or chills.  No exposure to coronavirus.  No new pain or discomfort in the right foot except it feels just slightly swollen at the tip of the first second where the amputation was performed.  Is not seeing any redness or felt signs of infection   Past Medical History:  Diagnosis Date  . Chronic kidney disease   . Depression   . Diabetes mellitus without complication ([place])   . Glaucoma   . Glaucoma   . Gout   . Heart murmur   . HLD (hyperlipidemia)   . Hypertension   . Prostate cancer ([place])   . Renal failure    dialysis t/t/s  . 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 Ischemic foot 10/11/2018  . Atherosclerosis of native arteries of the extremities with gangrene ([place]) 07/09/2018  . Gangrene of toe of right foot ([place]) 06/13/2018  . Diabetes mellitus ([place]) 04/16/2018  . Hypertension 04/16/2018  . ESRD on dialysis ([place]) 04/16/2018  . Malnutrition of moderate degree 03/27/2018  . Renal failure 03/21/2018    Past Surgical History:  Procedure Laterality Date  . AMPUTATION TOE  Bilateral 07/23/2018   Procedure: TOE MPJ RIGHT 3RD AND LEFT 2ND;  Surgeon: Samara Deist, DPM;  Location: [facility] ORS;  Service: Podiatry;  Laterality: Bilateral;  . APPENDECTOMY    . AV FISTULA PLACEMENT Left 06/09/2018   Procedure: ARTERIOVENOUS (AV) FISTULA CREATION ( BRACHIAL CEPHALIC);  Surgeon: Algernon Huxley, MD;  Location: [facility] ORS;  Service: Vascular;  Laterality: Left;  . CHOLECYSTECTOMY    . DIALYSIS/PERMA CATHETER INSERTION N/A 03/24/2018   Procedure: DIALYSIS/PERMA CATHETER INSERTION;  Surgeon: Katha Cabal, MD;  Location: [place] [facility];  Service: Cardiovascular;  Laterality: N/A;  . DIALYSIS/PERMA CATHETER REMOVAL N/A 09/06/2018   Procedure: DIALYSIS/PERMA CATHETER REMOVAL;  Surgeon: Algernon Huxley, MD;  Location: [place] [facility];  Service: Cardiovascular;  Laterality: N/A;  . INSERTION PROSTATE RADIATION SEED    . LOWER EXTREMITY ANGIOGRAPHY Right 06/16/2018   Procedure: LOWER EXTREMITY ANGIOGRAPHY;  Surgeon: Algernon Huxley, MD;  Location: [place] [facility];  Service: Cardiovascular;  Laterality: Right;  . LOWER EXTREMITY ANGIOGRAPHY Left 08/30/2018   Procedure: LOWER EXTREMITY ANGIOGRAPHY;  Surgeon: Algernon Huxley, MD;  Location: [place] [facility];  Service: Cardiovascular;  Laterality: Left;  . LOWER EXTREMITY ANGIOGRAPHY Right 09/30/2018   Procedure: LOWER EXTREMITY ANGIOGRAPHY;  Surgeon: Algernon Huxley, MD;  Location: [place] [facility];  Service: Cardiovascular;  Laterality: Right;  . LOWER EXTREMITY ANGIOGRAPHY Left 10/07/2018  Procedure: LOWER EXTREMITY ANGIOGRAPHY;  Surgeon: Algernon Huxley, MD;  Location: [place] [facility];  Service: Cardiovascular;  Laterality: Left;  . TRANSMETATARSAL AMPUTATION Bilateral 10/13/2018   Procedure: 1.  Amputation right great toe MTPJ 2.  Amputation right second toe MTPJ 3.  Amputation right fourth toe MTPJ 4.  Amputation right fifth toe MTPJ 5.  Excision distal second metatarsal head right foot 6.  Excision distal third metatarsal head  right foot 7.  Amputation left third toe 8.  Incision and drainage infectionleft 2nd toe amputation site. ;  Surgeon: Samara Deist, DPM;  Location    Prior to Admission medications   Medication Sig Start Date End Date Taking? Authorizing Provider  allopurinol (ZYLOPRIM) 100 MG tablet Take 100 mg by mouth daily.     [provider]  amLODipine (NORVASC) 10 MG tablet Take 10 mg by mouth daily.     [provider]  aspirin EC 81 MG EC tablet Take 1 tablet (81 mg total) by mouth daily. 06/18/18   Dustin Flock, MD  atorvastatin (LIPITOR) 10 MG tablet Take 1 tablet (10 mg total) by mouth daily at 6 PM. 08/30/18   Stegmayer, Joelene Millin A, PA-C  cloNIDine (CATAPRES) 0.2 MG tablet TAKE 1 TABLET BY MOUTH TWICE A DAY Patient taking differently: Take 0.2 mg by mouth 2 (two) times daily.  05/14/18   Algernon Huxley, MD  clopidogrel (PLAVIX) 75 MG tablet Take 1 tablet (75 mg total) by mouth daily. 06/18/18   Dustin Flock, MD  docusate sodium (COLACE) 100 MG capsule Take 1 capsule (100 mg total) by mouth 2 (two) times daily as needed for mild constipation. 10/15/18   Vaughan Basta, MD  escitalopram (LEXAPRO) 10 MG tablet Take 10 mg by mouth daily.     [provider]  gabapentin (NEURONTIN) 300 MG capsule TAKE 1 CAPSULE BY MOUTH THREE TIMES A DAY 11/08/18   Kris Hartmann, NP  glipiZIDE (GLUCOTROL) 5 MG tablet Take 5 mg by mouth daily before breakfast.     [provider]  hydrALAZINE (APRESOLINE) 25 MG tablet Take 1 tablet (25 mg total) by mouth every 8 (eight) hours. 03/27/18   Loletha Grayer, MD  multivitamin (RENA-VIT) TABS tablet Take 1 tablet by mouth at bedtime. Patient not taking: Reported on 10/11/2018 03/27/18   Loletha Grayer, MD  Nutritional Supplements (FEEDING SUPPLEMENT, NEPRO CARB STEADY,) LIQD Take 237 mLs by mouth 2 (two) times daily between meals. Patient not taking: Reported on 10/11/2018 03/27/18   Loletha Grayer, MD  oxyCODONE-acetaminophen  (PERCOCET/ROXICET) 5-325 MG tablet Take 1-2 tablets by mouth every 6 (six) hours as needed for moderate pain or severe pain. 10/15/18   Vaughan Basta, MD    Allergies Penicillin g  Family History  Problem Relation Age of Onset  . Varicose Veins Neg Hx     Social History Social History   Tobacco Use  . Smoking status: Current Some Day Smoker    Packs/day: 0.10    Types: Cigarettes  . Smokeless tobacco: Never Used  Substance Use Topics  . Alcohol use: Not Currently    Alcohol/week: 0.0 standard drinks    Frequency: Never  . Drug use: Never    Review of Systems Constitutional: No fever/chills Eyes: No visual changes. ENT: No sore throat. Cardiovascular: Denies chest pain. Respiratory: Denies shortness of breath. Gastrointestinal: No abdominal pain.   Musculoskeletal: See HPI.  Denies issue with the left foot.  No swelling in either leg. Skin: Negative for rash. Neurological: Negative  for headaches, areas of focal weakness or numbness.    ____________________________________________   PHYSICAL EXAM:  VITAL SIGNS: ED Triage Vitals  Enc Vitals Group     BP 12/12/18 0754 (!) 168/75     Pulse Rate 12/12/18 0754 64     Resp 12/12/18 0754 16     Temp 12/12/18 0754 98.3 F (36.8 C)     Temp Source 12/12/18 0754 Oral     SpO2 12/12/18 0754 100 %     Weight 12/12/18 0752 170 lb (77.1 kg)     Height 12/12/18 0752 6\' 1"  (1.854 m)     Head Circumference --      Peak Flow --      Pain Score 12/12/18 0751 10     Pain Loc --      Pain Edu? --      Excl. in [place]? --     Constitutional: Alert and oriented. Well appearing and in no acute distress. Eyes: Conjunctivae are normal. Head: Atraumatic. Nose: No congestion/rhinnorhea. Mouth/Throat: Mucous membranes are moist. Neck: No stridor.  Cardiovascular: Normal rate, regular rhythm. Grossly normal heart sounds.  Good peripheral circulation. Respiratory: Normal respiratory effort.  No retractions. Lungs CTAB.  Gastrointestinal: Soft and nontender. No distention. Musculoskeletal:   Lower Extremities  No edema. Normal DP/PT pulses bilateral with good cap refill.  Normal neuro-motor function lower extremities bilateral.  RIGHT Right lower extremity demonstrates normal strength, good use of all muscles. No edema bruising or contusions of the right hip, right knee, right ankle. Full range of motion of the right lower extremity without pain.  Prior amputations of all toes.  Patient has a slight area of dehiscence about 2 cm in length over the first and second toe amputation sites without bleeding.  There is no evidence of purulence.  There is no extending erythema or warmth.  There is some slight edema overlying the dorsal surface of the first and second amputation sites but no evidence of drainage or palpable fluid collection.  There is no redness streaking or warmth up into the foot or leg.  LEFT Left lower extremity demonstrates normal strength, good use of all muscles. No edema bruising or contusions of the hip,  knee, ankle. Full range of motion of the left lower extremity without pain.  Prior amputations of multiple toes on the left foot as well all areas clean dry and intact   Neurologic:  Normal speech and language. No gross focal neurologic deficits are appreciated.  Skin:  Skin is warm, dry and intact. No rash noted. Psychiatric: Mood and affect are normal. Speech and behavior are normal.  ____________________________________________   LABS (all labs ordered are listed, but only abnormal results are displayed)  Labs Reviewed  CBC WITH DIFFERENTIAL/PLATELET - Abnormal; Notable for the following components:      Result Value   RBC 3.66 (*)    Hemoglobin 11.9 (*)    HCT 37.0 (*)    MCV 101.1 (*)    RDW 17.2 (*)    All other components within normal limits  BASIC METABOLIC PANEL - Abnormal; Notable for the following components:   Chloride 96 (*)    Glucose, Bld 151 (*)    BUN 34 (*)     Creatinine, Ser 4.14 (*)    GFR calc non Af Amer 13 (*)    GFR calc Af Amer 16 (*)    All other components within normal limits   ____________________________________________  EKG   ____________________________________________  RADIOLOGY  ____________________________________________   PROCEDURES  Procedure(s) performed: None  Procedures  Critical Care performed: No  ____________________________________________   INITIAL IMPRESSION / ASSESSMENT AND PLAN / ED COURSE  Pertinent labs & imaging results that were available during my care of the patient were reviewed by me and considered in my medical decision making (see chart for details).   Pain and discomfort and some bleeding from what appears to be slight dehiscence of the right first and second amputation sites.  Reviewed notes by Dr. Vickki Muff and some dehiscence in this area was already noted on his last visit.  I see no signs of infection at this time and the patient denies infectious symptoms.  He is a dialysis patient can remains compliant with his regimen.  He has palpable pulse including dorsalis pedis and right foot  Clinical Course as of Dec 11 920  Sun Dec 12, 2018  0807 Palpable R dorsalis pedis. NO noted purulence. There is some mild dehiscense around 1 and 2 toe site, but no active bleeding or drainage. Paged podiatry for recommendations. Patient is a dialysis patient, reports he has a full, normal dialysis session yesterday. No shortness of breath. No fevers. No Cough   [MQ]  0815 Discussed with Dr. Thresa Ross. Recommends bandage and have close follow-up with podiatry. May need consideration for added surgery.    [MQ]    Clinical Course User Index [MQ] [name], MD    Podiatry recommends bandaging and close follow-up, recommends not to initiate antibiotic at this time.  Podiatry will be able to see the patient for follow-up on Monday or Tuesday in the clinic, and I think this is appropriate.  I see no  signs of infection, there is no evidence of active bleeding will bandage the area.  I suspect some slight dehiscence as cause for the reported bleeding which she reports was mild and is not bleeding now.  ----------------------------------------- 9:22 AM on 12/12/2018 -----------------------------------------  Labs reviewed, no acute concerns.  No leukocytosis.  And patient continue to rest comfortably, will bandage right foot wound as instructed by Dr. Cleda Mccreedy, and patient agreeable to calling to set up a follow-up visit for Monday or Tuesday with Dr. Vickki Muff.  Return precautions and treatment recommendations and follow-up discussed with the patient who is agreeable with the plan.  Patient's friend picking him up ____________________________________________   FINAL CLINICAL IMPRESSION(S) / ED DIAGNOSES  Final diagnoses:  Surgical wound dehiscence, initial encounter        Note:  This document was prepared using Dragon voice recognition software and may include unintentional dictation errors       [name], MD 12/12/18 [id number]

## 2018-12-12 NOTE — ED Notes (Signed)
Pt verbalized understanding of discharge instructions. NAD at this time. 

## 2018-12-12 NOTE — ED Triage Notes (Signed)
Pt to ED via POV c/o post op complication. Pt states that he had a partial amputation of his right foot 2 months ago and the wound is not healing correctly. Pt states that the wound has opened back up. Pt is in NAD.

## 2018-12-12 NOTE — ED Notes (Signed)
RN placed non stick dressing over right foot wound and wrapped with gauze.

## 2018-12-15 ENCOUNTER — Inpatient Hospital Stay
Admission: AD | Admit: 2018-12-15 | Discharge: 2018-12-21 | DRG: 503 | Disposition: A | Discharge status: Skilled Nursing Facility | Payer: Medicare Other | Source: Ambulatory Visit | Attending: Internal Medicine | Admitting: Internal Medicine

## 2018-12-15 ENCOUNTER — Other Ambulatory Visit: Payer: Self-pay

## 2018-12-15 DIAGNOSIS — E11628 Type 2 diabetes mellitus with other skin complications: Secondary | ICD-10-CM | POA: Diagnosis present

## 2018-12-15 DIAGNOSIS — Z7984 Long term (current) use of oral hypoglycemic drugs: Secondary | ICD-10-CM

## 2018-12-15 DIAGNOSIS — Z1159 Encounter for screening for other viral diseases: Secondary | ICD-10-CM

## 2018-12-15 DIAGNOSIS — L97519 Non-pressure chronic ulcer of other part of right foot with unspecified severity: Secondary | ICD-10-CM | POA: Diagnosis present

## 2018-12-15 DIAGNOSIS — F17210 Nicotine dependence, cigarettes, uncomplicated: Secondary | ICD-10-CM | POA: Diagnosis present

## 2018-12-15 DIAGNOSIS — M109 Gout, unspecified: Secondary | ICD-10-CM | POA: Diagnosis present

## 2018-12-15 DIAGNOSIS — Z79891 Long term (current) use of opiate analgesic: Secondary | ICD-10-CM

## 2018-12-15 DIAGNOSIS — E1152 Type 2 diabetes mellitus with diabetic peripheral angiopathy with gangrene: Secondary | ICD-10-CM | POA: Diagnosis present

## 2018-12-15 DIAGNOSIS — Z8673 Personal history of transient ischemic attack (TIA), and cerebral infarction without residual deficits: Secondary | ICD-10-CM

## 2018-12-15 DIAGNOSIS — Z9049 Acquired absence of other specified parts of digestive tract: Secondary | ICD-10-CM | POA: Diagnosis not present

## 2018-12-15 DIAGNOSIS — Z8546 Personal history of malignant neoplasm of prostate: Secondary | ICD-10-CM

## 2018-12-15 DIAGNOSIS — Z79899 Other long term (current) drug therapy: Secondary | ICD-10-CM

## 2018-12-15 DIAGNOSIS — Z7982 Long term (current) use of aspirin: Secondary | ICD-10-CM

## 2018-12-15 DIAGNOSIS — E1140 Type 2 diabetes mellitus with diabetic neuropathy, unspecified: Secondary | ICD-10-CM | POA: Diagnosis present

## 2018-12-15 DIAGNOSIS — Z880 Allergy status to penicillin: Secondary | ICD-10-CM | POA: Diagnosis not present

## 2018-12-15 DIAGNOSIS — F329 Major depressive disorder, single episode, unspecified: Secondary | ICD-10-CM | POA: Diagnosis present

## 2018-12-15 DIAGNOSIS — Z89411 Acquired absence of right great toe: Secondary | ICD-10-CM | POA: Diagnosis not present

## 2018-12-15 DIAGNOSIS — E785 Hyperlipidemia, unspecified: Secondary | ICD-10-CM | POA: Diagnosis present

## 2018-12-15 DIAGNOSIS — Z89421 Acquired absence of other right toe(s): Secondary | ICD-10-CM | POA: Diagnosis not present

## 2018-12-15 DIAGNOSIS — Z992 Dependence on renal dialysis: Secondary | ICD-10-CM

## 2018-12-15 DIAGNOSIS — D631 Anemia in chronic kidney disease: Secondary | ICD-10-CM | POA: Diagnosis present

## 2018-12-15 DIAGNOSIS — T8753 Necrosis of amputation stump, right lower extremity: Secondary | ICD-10-CM | POA: Diagnosis present

## 2018-12-15 DIAGNOSIS — E1122 Type 2 diabetes mellitus with diabetic chronic kidney disease: Secondary | ICD-10-CM | POA: Diagnosis present

## 2018-12-15 DIAGNOSIS — I120 Hypertensive chronic kidney disease with stage 5 chronic kidney disease or end stage renal disease: Secondary | ICD-10-CM | POA: Diagnosis present

## 2018-12-15 DIAGNOSIS — N186 End stage renal disease: Secondary | ICD-10-CM | POA: Diagnosis present

## 2018-12-15 DIAGNOSIS — I4892 Unspecified atrial flutter: Secondary | ICD-10-CM | POA: Diagnosis not present

## 2018-12-15 DIAGNOSIS — H409 Unspecified glaucoma: Secondary | ICD-10-CM | POA: Diagnosis present

## 2018-12-15 DIAGNOSIS — Y835 Amputation of limb(s) as the cause of abnormal reaction of the patient, or of later complication, without mention of misadventure at the time of the procedure: Secondary | ICD-10-CM | POA: Diagnosis present

## 2018-12-15 DIAGNOSIS — I70261 Atherosclerosis of native arteries of extremities with gangrene, right leg: Secondary | ICD-10-CM | POA: Diagnosis not present

## 2018-12-15 DIAGNOSIS — E11621 Type 2 diabetes mellitus with foot ulcer: Secondary | ICD-10-CM | POA: Diagnosis present

## 2018-12-15 DIAGNOSIS — L97509 Non-pressure chronic ulcer of other part of unspecified foot with unspecified severity: Secondary | ICD-10-CM | POA: Diagnosis present

## 2018-12-15 DIAGNOSIS — Z89422 Acquired absence of other left toe(s): Secondary | ICD-10-CM | POA: Diagnosis not present

## 2018-12-15 DIAGNOSIS — Z7902 Long term (current) use of antithrombotics/antiplatelets: Secondary | ICD-10-CM

## 2018-12-15 DIAGNOSIS — N2581 Secondary hyperparathyroidism of renal origin: Secondary | ICD-10-CM | POA: Diagnosis present

## 2018-12-15 DIAGNOSIS — I739 Peripheral vascular disease, unspecified: Secondary | ICD-10-CM | POA: Diagnosis not present

## 2018-12-15 LAB — CBC
HCT: 34.4 % — ABNORMAL LOW (ref 39.0–52.0)
Hemoglobin: 11.4 g/dL — ABNORMAL LOW (ref 13.0–17.0)
MCH: 33.3 pg (ref 26.0–34.0)
MCHC: 33.1 g/dL (ref 30.0–36.0)
MCV: 100.6 fL — ABNORMAL HIGH (ref 80.0–100.0)
Platelets: 165 10*3/uL (ref 150–400)
RBC: 3.42 MIL/uL — ABNORMAL LOW (ref 4.22–5.81)
RDW: 17.4 % — ABNORMAL HIGH (ref 11.5–15.5)
WBC: 6.9 10*3/uL (ref 4.0–10.5)
nRBC: 0 % (ref 0.0–0.2)

## 2018-12-15 LAB — COMPREHENSIVE METABOLIC PANEL
ALT: 18 U/L (ref 0–44)
AST: 23 U/L (ref 15–41)
Albumin: 3.5 g/dL (ref 3.5–5.0)
Alkaline Phosphatase: 48 U/L (ref 38–126)
Anion gap: 9 (ref 5–15)
BUN: 48 mg/dL — ABNORMAL HIGH (ref 8–23)
CO2: 26 mmol/L (ref 22–32)
Calcium: 8.9 mg/dL (ref 8.9–10.3)
Chloride: 103 mmol/L (ref 98–111)
Creatinine, Ser: 4.61 mg/dL — ABNORMAL HIGH (ref 0.61–1.24)
GFR calc Af Amer: 14 mL/min — ABNORMAL LOW (ref >60)
GFR calc non Af Amer: 12 mL/min — ABNORMAL LOW (ref >60)
Glucose, Bld: 96 mg/dL (ref 70–99)
Potassium: 3.6 mmol/L (ref 3.5–5.1)
Sodium: 138 mmol/L (ref 135–145)
Total Bilirubin: 0.7 mg/dL (ref 0.3–1.2)
Total Protein: 7.1 g/dL (ref 6.5–8.1)

## 2018-12-15 LAB — MRSA PCR SCREENING: MRSA by PCR: NEGATIVE

## 2018-12-15 LAB — HEMOGLOBIN A1C
Hgb A1c MFr Bld: 5.6 % (ref 4.8–5.6)
Mean Plasma Glucose: 114.02 mg/dL

## 2018-12-15 LAB — GLUCOSE, CAPILLARY
Glucose-Capillary: 94 mg/dL (ref 70–99)
Glucose-Capillary: 83 mg/dL (ref 70–99)

## 2018-12-15 LAB — SARS CORONAVIRUS 2 BY RT PCR (HOSPITAL ORDER, PERFORMED IN ~~LOC~~ HOSPITAL LAB): SARS Coronavirus 2: NEGATIVE

## 2018-12-15 MED ORDER — NICOTINE 21 MG/24HR TD PT24
21.0000 mg | MEDICATED_PATCH | Freq: Every day | TRANSDERMAL | Status: DC
  Administered 2018-12-15 – 2018-12-21 (×6): 21 mg via TRANSDERMAL
  Filled 2018-12-15 (×7): qty 1

## 2018-12-15 MED ORDER — ESCITALOPRAM OXALATE 10 MG PO TABS
10.0000 mg | ORAL_TABLET | Freq: Every day | ORAL | Status: DC
  Administered 2018-12-16 – 2018-12-21 (×5): 10 mg via ORAL
  Filled 2018-12-15 (×7): qty 1

## 2018-12-15 MED ORDER — INSULIN ASPART 100 UNIT/ML ~~LOC~~ SOLN: 0.0000 [IU] | Freq: Every day | SUBCUTANEOUS | Status: DC

## 2018-12-15 MED ORDER — HYDRALAZINE HCL 25 MG PO TABS
25.0000 mg | ORAL_TABLET | Freq: Three times a day (TID) | ORAL | Status: DC
  Administered 2018-12-15 – 2018-12-21 (×15): 25 mg via ORAL
  Filled 2018-12-15 (×16): qty 1

## 2018-12-15 MED ORDER — ATORVASTATIN CALCIUM 10 MG PO TABS
10.0000 mg | ORAL_TABLET | Freq: Every day | ORAL | Status: DC
  Administered 2018-12-16 – 2018-12-21 (×5): 10 mg via ORAL
  Filled 2018-12-15 (×7): qty 1

## 2018-12-15 MED ORDER — ACETAMINOPHEN 325 MG PO TABS: 650.0000 mg | ORAL_TABLET | Freq: Four times a day (QID) | ORAL | Status: DC | PRN

## 2018-12-15 MED ORDER — VANCOMYCIN HCL 10 G IV SOLR
1750.0000 mg | Freq: Once | INTRAVENOUS | Status: AC
  Administered 2018-12-15: 18:00:00 1750 mg via INTRAVENOUS
  Filled 2018-12-15: qty 1750

## 2018-12-15 MED ORDER — SODIUM CHLORIDE 0.9% FLUSH
3.0000 mL | Freq: Two times a day (BID) | INTRAVENOUS | Status: DC
  Administered 2018-12-16 – 2018-12-21 (×7): 3 mL via INTRAVENOUS

## 2018-12-15 MED ORDER — INSULIN ASPART 100 UNIT/ML ~~LOC~~ SOLN
0.0000 [IU] | Freq: Three times a day (TID) | SUBCUTANEOUS | Status: DC
  Administered 2018-12-16: 3 [IU] via SUBCUTANEOUS
  Filled 2018-12-15: qty 1

## 2018-12-15 MED ORDER — HEPARIN SODIUM (PORCINE) 5000 UNIT/ML IJ SOLN
5000.0000 [IU] | Freq: Three times a day (TID) | INTRAMUSCULAR | Status: DC
  Administered 2018-12-15 – 2018-12-21 (×15): 5000 [IU] via SUBCUTANEOUS
  Filled 2018-12-15 (×16): qty 1

## 2018-12-15 MED ORDER — NEPRO/CARBSTEADY PO LIQD
237.0000 mL | Freq: Two times a day (BID) | ORAL | Status: DC
  Administered 2018-12-16 – 2018-12-17 (×3): 237 mL via ORAL

## 2018-12-15 MED ORDER — ONDANSETRON HCL 4 MG PO TABS: 4.0000 mg | ORAL_TABLET | Freq: Four times a day (QID) | ORAL | Status: DC | PRN

## 2018-12-15 MED ORDER — CLONIDINE HCL 0.1 MG PO TABS
0.2000 mg | ORAL_TABLET | Freq: Two times a day (BID) | ORAL | Status: DC
  Administered 2018-12-15 – 2018-12-20 (×8): 0.2 mg via ORAL
  Filled 2018-12-15 (×9): qty 2

## 2018-12-15 MED ORDER — GLIPIZIDE 5 MG PO TABS
5.0000 mg | ORAL_TABLET | Freq: Every day | ORAL | Status: DC
  Administered 2018-12-18 – 2018-12-19 (×2): 5 mg via ORAL
  Filled 2018-12-15 (×4): qty 1

## 2018-12-15 MED ORDER — CLINDAMYCIN PHOSPHATE 600 MG/50ML IV SOLN
600.0000 mg | Freq: Three times a day (TID) | INTRAVENOUS | Status: DC
  Administered 2018-12-15 – 2018-12-19 (×12): 600 mg via INTRAVENOUS
  Filled 2018-12-15 (×16): qty 50

## 2018-12-15 MED ORDER — ALLOPURINOL 100 MG PO TABS
100.0000 mg | ORAL_TABLET | Freq: Every day | ORAL | Status: DC
  Administered 2018-12-16 – 2018-12-21 (×5): 100 mg via ORAL
  Filled 2018-12-15 (×5): qty 1

## 2018-12-15 MED ORDER — SENNOSIDES-DOCUSATE SODIUM 8.6-50 MG PO TABS: 1.0000 | ORAL_TABLET | Freq: Every evening | ORAL | Status: DC | PRN

## 2018-12-15 MED ORDER — AMLODIPINE BESYLATE 10 MG PO TABS
10.0000 mg | ORAL_TABLET | Freq: Every day | ORAL | Status: DC
  Administered 2018-12-16 – 2018-12-20 (×3): 10 mg via ORAL
  Filled 2018-12-15 (×4): qty 1

## 2018-12-15 MED ORDER — ONDANSETRON HCL 4 MG/2ML IJ SOLN
4.0000 mg | Freq: Four times a day (QID) | INTRAMUSCULAR | Status: DC | PRN
  Administered 2018-12-17: 4 mg via INTRAVENOUS

## 2018-12-15 MED ORDER — ACETAMINOPHEN 650 MG RE SUPP: 650.0000 mg | Freq: Four times a day (QID) | RECTAL | Status: DC | PRN

## 2018-12-15 MED ORDER — VANCOMYCIN HCL IN DEXTROSE 750-5 MG/150ML-% IV SOLN
750.0000 mg | INTRAVENOUS | Status: DC
  Filled 2018-12-15: qty 150

## 2018-12-15 MED ORDER — HYDROCODONE-ACETAMINOPHEN 5-325 MG PO TABS
1.0000 | ORAL_TABLET | ORAL | Status: DC | PRN
  Administered 2018-12-15: 1 via ORAL
  Administered 2018-12-17 – 2018-12-21 (×12): 2 via ORAL
  Filled 2018-12-15 (×7): qty 2
  Filled 2018-12-15: qty 1
  Filled 2018-12-15 (×6): qty 2

## 2018-12-15 MED ORDER — RENA-VITE PO TABS
1.0000 | ORAL_TABLET | Freq: Every day | ORAL | Status: DC
  Administered 2018-12-15 – 2018-12-20 (×6): 1 via ORAL
  Filled 2018-12-15 (×6): qty 1

## 2018-12-15 MED ORDER — SODIUM CHLORIDE 0.9% FLUSH: 3.0000 mL | INTRAVENOUS | Status: DC | PRN

## 2018-12-15 MED ORDER — PNEUMOCOCCAL VAC POLYVALENT 25 MCG/0.5ML IJ INJ: 0.5000 mL | INJECTION | INTRAMUSCULAR | Status: DC

## 2018-12-15 MED ORDER — ENOXAPARIN SODIUM 40 MG/0.4ML ~~LOC~~ SOLN: 40.0000 mg | SUBCUTANEOUS | Status: DC

## 2018-12-15 MED ORDER — SODIUM CHLORIDE 0.9 % IV SOLN
250.0000 mL | INTRAVENOUS | Status: DC | PRN
  Administered 2018-12-17: 250 mL via INTRAVENOUS
  Administered 2018-12-17: 10:00:00 via INTRAVENOUS
  Administered 2018-12-18: 250 mL via INTRAVENOUS
  Administered 2018-12-20: via INTRAVENOUS

## 2018-12-15 MED ORDER — GABAPENTIN 300 MG PO CAPS
300.0000 mg | ORAL_CAPSULE | Freq: Every day | ORAL | Status: DC
  Administered 2018-12-15 – 2018-12-20 (×6): 300 mg via ORAL
  Filled 2018-12-15 (×6): qty 1

## 2018-12-15 NOTE — Consult Note (Signed)
ORTHOPAEDIC CONSULTATION  REQUESTING PHYSICIAN: Saundra Shelling, MD  Chief Complaint: Right foot infection.  HPI: Jose [name] is a 73 y.o. male who complains of pain and drainage to his right foot.  Well-known to me.  He is undergone multiple debridements.  He underwent amputation of lesser toe on his left foot and all of the digits on his right foot in May.  Had been doing fairly well with some areas of early necrosis.  Noted drainage earlier in the week was seen in the ER and subsequently seen in the office today.  There was foul odor and noted drainage with necrosis to the distal medial aspect of the toe amputation sites.  He is admitted for antibiotics and likely further debridement of his right foot.  Past Medical History:  Diagnosis Date  . Chronic kidney disease   . Depression   . Diabetes mellitus without complication ([place])   . Glaucoma   . Glaucoma   . Gout   . Heart murmur   . HLD (hyperlipidemia)   . Hypertension   . Prostate cancer ([place])   . Renal failure    dialysis t/t/s  . Stroke ([place])    tia x 2   Past Surgical History:  Procedure Laterality Date  . AMPUTATION TOE Bilateral 07/23/2018   Procedure: TOE MPJ RIGHT 3RD AND LEFT 2ND;  Surgeon: Samara Deist, DPM;  Location: [facility];  Service: Podiatry;  Laterality: Bilateral;  . APPENDECTOMY    . AV FISTULA PLACEMENT Left 06/09/2018   Procedure: ARTERIOVENOUS (AV) FISTULA CREATION ( BRACHIAL CEPHALIC);  Surgeon: Algernon Huxley, MD;  Location: [facility];  Service: Vascular;  Laterality: Left;  . CHOLECYSTECTOMY    . DIALYSIS/PERMA CATHETER INSERTION N/A 03/24/2018   Procedure: DIALYSIS/PERMA CATHETER INSERTION;  Surgeon: Katha Cabal, MD;  Location: [place] [facility];  Service: Cardiovascular;  Laterality: N/A;  . DIALYSIS/PERMA CATHETER REMOVAL N/A 09/06/2018   Procedure: DIALYSIS/PERMA CATHETER REMOVAL;  Surgeon: Algernon Huxley, MD;  Location: [place] [facility];  Service: Cardiovascular;  Laterality: N/A;  .  INSERTION PROSTATE RADIATION SEED    . LOWER EXTREMITY ANGIOGRAPHY Right 06/16/2018   Procedure: LOWER EXTREMITY ANGIOGRAPHY;  Surgeon: Algernon Huxley, MD;  Location: [place] [facility];  Service: Cardiovascular;  Laterality: Right;  . LOWER EXTREMITY ANGIOGRAPHY Left 08/30/2018   Procedure: LOWER EXTREMITY ANGIOGRAPHY;  Surgeon: Algernon Huxley, MD;  Location: [place] [facility];  Service: Cardiovascular;  Laterality: Left;  . LOWER EXTREMITY ANGIOGRAPHY Right 09/30/2018   Procedure: LOWER EXTREMITY ANGIOGRAPHY;  Surgeon: Algernon Huxley, MD;  Location: [place] [facility];  Service: Cardiovascular;  Laterality: Right;  . LOWER EXTREMITY ANGIOGRAPHY Left 10/07/2018   Procedure: LOWER EXTREMITY ANGIOGRAPHY;  Surgeon: Algernon Huxley, MD;  Location: [place] [facility];  Service: Cardiovascular;  Laterality: Left;  . TRANSMETATARSAL AMPUTATION Bilateral 10/13/2018   Procedure: 1.  Amputation right great toe MTPJ 2.  Amputation right second toe MTPJ 3.  Amputation right fourth toe MTPJ 4.  Amputation right fifth toe MTPJ 5.  Excision distal second metatarsal head right foot 6.  Excision distal third metatarsal head right foot 7.  Amputation left third toe 8.  Incision and drainage infectionleft 2nd toe amputation site. ;  Surgeon: Samara Deist, DPM;  Location   Social History   Socioeconomic History  . Marital status: Widowed    Spouse name: Not on file  . Number of children: Not on file  . Years of education: Not on  file  . Highest education level: Not on file  Occupational History    Employer: RETIRED  Social Needs  . Financial resource strain: Not on file  . Food insecurity    Worry: Not on file    Inability: Not on file  . Transportation needs    Medical: Not on file    Non-medical: Not on file  Tobacco Use  . Smoking status: Current Some Day Smoker    Packs/day: 0.10    Types: Cigarettes  . Smokeless tobacco: Never Used  Substance and Sexual Activity  . Alcohol use: Not Currently     Alcohol/week: 0.0 standard drinks    Frequency: Never  . Drug use: Never  . Sexual activity: Not Currently  Lifestyle  . Physical activity    Days per week: Not on file    Minutes per session: Not on file  . Stress: Not on file  Relationships  . Social Herbalist on phone: Not on file    Gets together: Not on file    Attends religious service: Not on file    Active member of club or organization: Not on file    Attends meetings of clubs or organizations: Not on file    Relationship status: Not on file  Other Topics Concern  . Not on file  Social History Narrative  . Not on file   Family History  Problem Relation Age of Onset  . Varicose Veins Neg Hx    Allergies  Allergen Reactions  . Penicillin G Other (See Comments)    Other reaction(s): Unknown DID THE REACTION INVOLVE: Swelling of the face/tongue/throat, SOB, or low BP? Unknown Sudden or severe rash/hives, skin peeling, or the inside of the mouth or nose? Unknown Did it require medical treatment? Unknown When did it last happen?unknown If all above answers are "NO", may proceed with cephalosporin use.    Prior to Admission medications   Medication Sig Start Date End Date Taking? Authorizing Provider  allopurinol (ZYLOPRIM) 100 MG tablet Take 100 mg by mouth daily.     [provider]  amLODipine (NORVASC) 10 MG tablet Take 10 mg by mouth daily.     [provider]  aspirin EC 81 MG EC tablet Take 1 tablet (81 mg total) by mouth daily. 06/18/18   Dustin Flock, MD  atorvastatin (LIPITOR) 10 MG tablet Take 1 tablet (10 mg total) by mouth daily at 6 PM. 08/30/18   Stegmayer, Joelene Millin A, PA-C  cloNIDine (CATAPRES) 0.2 MG tablet TAKE 1 TABLET BY MOUTH TWICE A DAY Patient taking differently: Take 0.2 mg by mouth 2 (two) times daily.  05/14/18   Algernon Huxley, MD  clopidogrel (PLAVIX) 75 MG tablet Take 1 tablet (75 mg total) by mouth daily. 06/18/18   Dustin Flock, MD  docusate sodium (COLACE)  100 MG capsule Take 1 capsule (100 mg total) by mouth 2 (two) times daily as needed for mild constipation. 10/15/18   Vaughan Basta, MD  escitalopram (LEXAPRO) 10 MG tablet Take 10 mg by mouth daily.     [provider]  gabapentin (NEURONTIN) 300 MG capsule TAKE 1 CAPSULE BY MOUTH THREE TIMES A DAY 11/08/18   Kris Hartmann, NP  glipiZIDE (GLUCOTROL) 5 MG tablet Take 5 mg by mouth daily before breakfast.     [provider]  hydrALAZINE (APRESOLINE) 25 MG tablet Take 1 tablet (25 mg total) by mouth every 8 (eight) hours. 03/27/18   Loletha Grayer, MD  multivitamin (RENA-VIT) TABS tablet Take 1 tablet by mouth at bedtime. Patient not taking: Reported on 10/11/2018 03/27/18   Loletha Grayer, MD  Nutritional Supplements (FEEDING SUPPLEMENT, NEPRO CARB STEADY,) LIQD Take 237 mLs by mouth 2 (two) times daily between meals. Patient not taking: Reported on 10/11/2018 03/27/18   Loletha Grayer, MD  oxyCODONE-acetaminophen (PERCOCET/ROXICET) 5-325 MG tablet Take 1-2 tablets by mouth every 6 (six) hours as needed for moderate pain or severe pain. 10/15/18   Vaughan Basta, MD   No results found.  Positive ROS: All other systems have been reviewed and were otherwise negative with the exception of those mentioned in the HPI and as above.  12 point ROS was performed.  Physical Exam: General: Alert and oriented.  No apparent distress.  Vascular:  Left foot:Dorsalis Pedis:  absent Posterior Tibial:  absent  Right foot: Dorsalis Pedis:  absent Posterior Tibial:  absent  Neuro:intact sensation.  Derm: Left foot has a small superficial wound from the left third toe amputation.  Right foot with necrosis to the distal medial aspect of the amputation flap.  There is some foul odor with a scant amount of purulent drainage.  Ortho/MS: He is status post left second and third toe amputation.  He is status post amputation of all digits on his right foot.  He has mild edema  bilaterally.  Assessment: Severe peripheral vascular disease with gangrenous changes distal right foot Diabetic foot infection right foot Status post amputation lesser toes left foot  Plan: He has gangrenous changes with areas of infection and necrosis on his right foot.  He will be admitted for antibiotics and will need debridement of the right foot.  I have consulted vascular surgery but based on their last note and evaluation there is not much left to do.  We will get their opinion.  Will plan for surgery on Friday for debridement of tissue and possible bone with primary closure of the wound if possible.  We will follow-up tomorrow.  Should have minimal weightbearing on his right foot and left foot with assistance only.  Bulky sterile dressing to his right foot has been applied in the outpatient clinic.  We also performed a deep wound culture of his right foot in the outpatient clinic and I can monitor this as well.    Elesa Hacker, DPM Cell [phone]   12/15/2018 6:16 PM

## 2018-12-15 NOTE — Progress Notes (Signed)
Per MD okay for RN to order Renal/ carb modified diet with 1200 ml restriction.

## 2018-12-15 NOTE — Progress Notes (Signed)
Pharmacy Antibiotic Note  Jose [name] is a 73 y.o. male admitted on 12/15/2018 with wound infection.  Pharmacy has been consulted for vancomycin dosing.   Pt with hx of E faecalis and proteus in wound on left foot. Pt s/p amputation of multiple digits and debridement by podiatry.   Plan: Vancomycin 1.75 g x1 then 750 mg with last hour of dialysis TuThSa.   Height: 6\' 1"  (185.4 cm) IBW/kg (Calculated) : 79.9  Temp (24hrs), Avg:98.9 F (37.2 C), Min:98.9 F (37.2 C), Max:98.9 F (37.2 C)  Recent Labs  Lab 12/12/18 0817  WBC 7.0  CREATININE 4.14*    Estimated Creatinine Clearance: 17.6 mL/min (A) (by C-G formula based on SCr of 4.14 mg/dL (H)).    Allergies  Allergen Reactions  . Penicillin G Other (See Comments)    Other reaction(s): Unknown DID THE REACTION INVOLVE: Swelling of the face/tongue/throat, SOB, or low BP? Unknown Sudden or severe rash/hives, skin peeling, or the inside of the mouth or nose? Unknown Did it require medical treatment? Unknown When did it last happen?unknown If all above answers are "NO", may proceed with cephalosporin use.     Antimicrobials this admission: Clindamycin 7/15 >>  Vancomycin 7/15 >>   Dose adjustments this admission: N/A  Microbiology results: MRSA PCR 7/15: Pending COVID19 7/15: Pending  Thank you for allowing pharmacy to be a part of this patient's care.  Tawnya Crook 12/15/2018 4:53 PM

## 2018-12-15 NOTE — H&P (Signed)
[place] [facility] [place] NAME: Jose [name]    MR#:  [id number]  DATE OF BIRTH:  [birth year]  DATE OF ADMISSION:  12/15/2018  PRIMARY CARE PHYSICIAN: [name], MD   REQUESTING/REFERRING PHYSICIAN:   CHIEF COMPLAINT: Patient referred from podiatry clinic for debridement  HISTORY OF PRESENT ILLNESS: Jose [name]  is a 73 y.o. 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  She does drainage in the right foot.  He underwent amputation of the lesser toe on the left foot and all of the digits on the right foot in May.  Has some areas of necrosis in the right foot and discharge.  He was referred from podiatry clinic for drainage and antibiotics.  There is foul odor and drainage with necrosis at the distal medial aspect of the toe amputation sites.  She has some pain in the right foot.  COVID-19 test pending.  PAST MEDICAL HISTORY:   Past Medical History:  Diagnosis Date  . Chronic kidney disease   . Depression   . Diabetes mellitus without complication ([place])   . Glaucoma   . Glaucoma   . Gout   . Heart murmur   . HLD (hyperlipidemia)   . Hypertension   . Prostate cancer ([place])   . Renal failure    dialysis t/t/s  . 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 AMPUTATION TOE Bilateral 07/23/2018   Procedure: TOE MPJ RIGHT 3RD AND LEFT 2ND;  Surgeon: Samara Deist, DPM;  Location: [facility];  Service: Podiatry;  Laterality: Bilateral;  . APPENDECTOMY    . AV FISTULA PLACEMENT Left 06/09/2018   Procedure: ARTERIOVENOUS (AV) FISTULA CREATION ( BRACHIAL CEPHALIC);  Surgeon: Algernon Huxley, MD;  Location: [facility];  Service: Vascular;  Laterality: Left;  . CHOLECYSTECTOMY    . DIALYSIS/PERMA CATHETER INSERTION N/A 03/24/2018    Procedure: DIALYSIS/PERMA CATHETER INSERTION;  Surgeon: Katha Cabal, MD;  Location: [place] [facility];  Service: Cardiovascular;  Laterality: N/A;  . DIALYSIS/PERMA CATHETER REMOVAL N/A 09/06/2018   Procedure: DIALYSIS/PERMA CATHETER REMOVAL;  Surgeon: Algernon Huxley, MD;  Location: [place] [facility];  Service: Cardiovascular;  Laterality: N/A;  . INSERTION PROSTATE RADIATION SEED    . LOWER EXTREMITY ANGIOGRAPHY Right 06/16/2018   Procedure: LOWER EXTREMITY ANGIOGRAPHY;  Surgeon: Algernon Huxley, MD;  Location: [place] [facility];  Service: Cardiovascular;  Laterality: Right;  . LOWER EXTREMITY ANGIOGRAPHY Left 08/30/2018   Procedure: LOWER EXTREMITY ANGIOGRAPHY;  Surgeon: Algernon Huxley, MD;  Location: [place] [facility];  Service: Cardiovascular;  Laterality: Left;  . LOWER EXTREMITY ANGIOGRAPHY Right 09/30/2018   Procedure: LOWER EXTREMITY ANGIOGRAPHY;  Surgeon: Algernon Huxley, MD;  Location: [place] [facility];  Service: Cardiovascular;  Laterality: Right;  . LOWER EXTREMITY ANGIOGRAPHY Left 10/07/2018   Procedure: LOWER EXTREMITY ANGIOGRAPHY;  Surgeon: Algernon Huxley, MD;  Location: [place] [facility];  Service: Cardiovascular;  Laterality: Left;  . TRANSMETATARSAL AMPUTATION Bilateral 10/13/2018   Procedure: 1.  Amputation right great toe MTPJ 2.  Amputation right second toe MTPJ 3.  Amputation right fourth toe MTPJ 4.  Amputation right fifth toe MTPJ 5.  Excision distal second metatarsal head right foot 6.  Excision distal third metatarsal head right foot  7.  Amputation left third toe 8.  Incision and drainage infectionleft 2nd toe amputation site. ;  Surgeon: Samara Deist, DPM;  Location    SOCIAL HISTORY:  Social History   Tobacco Use  . Smoking status: Current Some Day Smoker    Packs/day: 0.10    Types: Cigarettes  . Smokeless tobacco: Never Used  Substance Use Topics  . Alcohol use: Not Currently    Alcohol/week: 0.0 standard drinks    Frequency: Never    FAMILY  HISTORY:  Family History  Problem Relation Age of Onset  . Varicose Veins Neg Hx     DRUG ALLERGIES:  Allergies  Allergen Reactions  . Penicillin G Other (See Comments)    Other reaction(s): Unknown DID THE REACTION INVOLVE: Swelling of the face/tongue/throat, SOB, or low BP? Unknown Sudden or severe rash/hives, skin peeling, or the inside of the mouth or nose? Unknown Did it require medical treatment? Unknown When did it last happen?unknown If all above answers are "NO", may proceed with cephalosporin use.     REVIEW OF SYSTEMS:   CONSTITUTIONAL: No fever, fatigue or weakness.  EYES: No blurred or double vision.  EARS, NOSE, AND THROAT: No tinnitus or ear pain.  RESPIRATORY: No cough, shortness of breath, wheezing or hemoptysis.  CARDIOVASCULAR: No chest pain, orthopnea, edema.  GASTROINTESTINAL: No nausea, vomiting, diarrhea or abdominal pain.  GENITOURINARY: No dysuria, hematuria.  ENDOCRINE: No polyuria, nocturia,  HEMATOLOGY: No anemia, easy bruising or bleeding SKIN: No rash or lesion. MUSCULOSKELETAL: Right foot bandage NEUROLOGIC: No tingling, numbness, weakness.  PSYCHIATRY: No anxiety or depression.   MEDICATIONS AT HOME:  Prior to Admission medications   Medication Sig Start Date End Date Taking? Authorizing Provider  allopurinol (ZYLOPRIM) 100 MG tablet Take 100 mg by mouth daily.     [provider]  amLODipine (NORVASC) 10 MG tablet Take 10 mg by mouth daily.     [provider]  aspirin EC 81 MG EC tablet Take 1 tablet (81 mg total) by mouth daily. 06/18/18   Dustin Flock, MD  atorvastatin (LIPITOR) 10 MG tablet Take 1 tablet (10 mg total) by mouth daily at 6 PM. 08/30/18   Stegmayer, Joelene Millin A, PA-C  cloNIDine (CATAPRES) 0.2 MG tablet TAKE 1 TABLET BY MOUTH TWICE A DAY Patient taking differently: Take 0.2 mg by mouth 2 (two) times daily.  05/14/18   Algernon Huxley, MD  clopidogrel (PLAVIX) 75 MG tablet Take 1 tablet (75 mg total) by mouth  daily. 06/18/18   Dustin Flock, MD  docusate sodium (COLACE) 100 MG capsule Take 1 capsule (100 mg total) by mouth 2 (two) times daily as needed for mild constipation. 10/15/18   Vaughan Basta, MD  escitalopram (LEXAPRO) 10 MG tablet Take 10 mg by mouth daily.     [provider]  gabapentin (NEURONTIN) 300 MG capsule TAKE 1 CAPSULE BY MOUTH THREE TIMES A DAY 11/08/18   Kris Hartmann, NP  glipiZIDE (GLUCOTROL) 5 MG tablet Take 5 mg by mouth daily before breakfast.     [provider]  hydrALAZINE (APRESOLINE) 25 MG tablet Take 1 tablet (25 mg total) by mouth every 8 (eight) hours. 03/27/18   Loletha Grayer, MD  multivitamin (RENA-VIT) TABS tablet Take 1 tablet by mouth at bedtime. Patient not taking: Reported on 10/11/2018 03/27/18   Loletha Grayer, MD  Nutritional Supplements (FEEDING SUPPLEMENT, NEPRO CARB STEADY,) LIQD Take 237 mLs by mouth 2 (two) times daily between meals. Patient not taking: Reported on  10/11/2018 03/27/18   Loletha Grayer, MD  oxyCODONE-acetaminophen (PERCOCET/ROXICET) 5-325 MG tablet Take 1-2 tablets by mouth every 6 (six) hours as needed for moderate pain or severe pain. 10/15/18   Vaughan Basta, MD      PHYSICAL EXAMINATION:   VITAL SIGNS: Blood pressure 130/68, pulse (!) 56, temperature 98.9 F (37.2 C), temperature source Oral, height 6\' 1"  (1.854 m), weight 67.8 kg, SpO2 100 %.  GENERAL:  73 y.o.-year-old patient lying in the bed with no acute distress.  EYES: Pupils equal, round, reactive to light and accommodation. No scleral icterus. Extraocular muscles intact.  HEENT: Head atraumatic, normocephalic. Oropharynx and nasopharynx clear.  NECK:  Supple, no jugular venous distention. No thyroid enlargement, no tenderness.  LUNGS: Normal breath sounds bilaterally, no wheezing, rales,rhonchi or crepitation. No use of accessory muscles of respiration.  CARDIOVASCULAR: S1, S2 normal. No murmurs, rubs, or gallops.  ABDOMEN: Soft,  nontender, nondistended. Bowel sounds present. No organomegaly or mass.  EXTREMITIES: No pedal edema, cyanosis,  Lesser toe left foot has been amputated Amputation of toes in the right foot NEUROLOGIC: Cranial nerves II through XII are intact. Muscle strength 5/5 in all extremities. Sensation intact. Gait not checked.  PSYCHIATRIC: The patient is alert and oriented x 3.  SKIN: Right foot toes amputated Necrosis noted at the distal medial aspect of toe amputation sites LABORATORY PANEL:   CBC Recent Labs  Lab 12/12/18 0817  WBC 7.0  HGB 11.9*  HCT 37.0*  PLT 171  MCV 101.1*  MCH 32.5  MCHC 32.2  RDW 17.2*  LYMPHSABS 1.7  MONOABS 0.6  EOSABS 0.3  BASOSABS 0.1   ------------------------------------------------------------------------------------------------------------------  Chemistries  Recent Labs  Lab 12/12/18 0817  NA 138  K 3.9  CL 96*  CO2 30  GLUCOSE 151*  BUN 34*  CREATININE 4.14*  CALCIUM 8.9   ------------------------------------------------------------------------------------------------------------------ estimated creatinine clearance is 15.5 mL/min (A) (by C-G formula based on SCr of 4.14 mg/dL (H)). ------------------------------------------------------------------------------------------------------------------ No results for input(s): TSH, T4TOTAL, T3FREE, THYROIDAB in the last 72 hours.  Invalid input(s): FREET3   Coagulation profile No results for input(s): INR, PROTIME in the last 168 hours. ------------------------------------------------------------------------------------------------------------------- No results for input(s): DDIMER in the last 72 hours. -------------------------------------------------------------------------------------------------------------------  Cardiac Enzymes No results for input(s): CKMB, TROPONINI, MYOGLOBIN in the last 168 hours.  Invalid input(s):  CK ------------------------------------------------------------------------------------------------------------------ Invalid input(s): POCBNP  ---------------------------------------------------------------------------------------------------------------  Urinalysis    Component Value Date/Time   COLORURINE YELLOW (A) 07/08/2018 1256   APPEARANCEUR CLEAR (A) 07/08/2018 1256   LABSPEC 1.014 07/08/2018 1256   PHURINE 6.0 07/08/2018 1256   GLUCOSEU NEGATIVE 07/08/2018 1256   HGBUR NEGATIVE 07/08/2018 1256   BILIRUBINUR NEGATIVE 07/08/2018 1256   KETONESUR NEGATIVE 07/08/2018 1256   PROTEINUR 100 (A) 07/08/2018 1256   NITRITE NEGATIVE 07/08/2018 1256   LEUKOCYTESUR NEGATIVE 07/08/2018 1256     RADIOLOGY: No results found.  EKG: Orders placed or performed during the hospital encounter of 09/30/18  . EKG 12-Lead  . EKG 12-Lead  . EKG 12-Lead  . 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  -Gangrene of right foot Podiatry consult for possible more debridement of the tissue and possible bone with primary closure of the wound Debridement Wound care  -Diabetic foot infection right foot Start patient on IV vancomycin and clindamycin antibiotics Follow-up wound culture and blood  cultures  -Peripheral vascular disease with gangrenous changes distal right foot Vascular surgery consult  -End-stage renal disease Nephrology consult and dialysis  -DVT prophylaxis subcu heparin  -Tobacco abuse Tobacco cessation counseled to the patient for 6 minutes Nicotine patch offered   All the records are reviewed and case discussed with ED provider. Management plans discussed with the patient, family and they are in agreement.  CODE STATUS:Full code    Code Status  Orders  (From admission, onward)         Start     Ordered   12/15/18 1640  Full code  Continuous     12/15/18 1639        Code Status History    Date Active Date Inactive Code Status Order ID Comments User Context   10/11/2018 2235 10/16/2018 1851 Full Code [id number]  Vaughan Basta, MD ED   10/07/2018 1042 10/07/2018 1733 Full Code [id number]  Algernon Huxley, MD Inpatient   09/30/2018 0956 09/30/2018 1441 Full Code [id number]  Algernon Huxley, MD Inpatient   08/30/2018 1224 08/30/2018 2034 Full Code [id number]  Algernon Huxley, MD Inpatient   07/23/2018 1111 07/23/2018 1517 Full Code [id number]  Samara Deist, [facility] Inpatient   06/13/2018 0135 06/17/2018 2206 Full Code [id number]  Lance Coon, MD ED   03/21/2018 1315 03/28/2018 1004 Full Code [id number]  Saundra Shelling, MD ED   Advance Care Planning Activity       TOTAL TIME TAKING CARE OF THIS PATIENT: 52 minutes.    Saundra Shelling M.D on 12/15/2018 at 6:31 PM  Between 7am to 6pm - Pager - [phone]  After 6pm go to [email or web address] - password [email or web address] [facility]  [facility]  Office  [phone]  CC: Primary care physician; [name], MD

## 2018-12-15 NOTE — Progress Notes (Signed)
Pharmacist Communication  Patient's order for Lovenox 40 mg SQ daily for DVT prophylaxis has been changed to heparin 5000 units SQ q8h based on CrCl < 15 ml/min.  Dorena Bodo, PharmD Clinical Pharmacist

## 2018-12-16 ENCOUNTER — Other Ambulatory Visit (INDEPENDENT_AMBULATORY_CARE_PROVIDER_SITE_OTHER): Payer: Self-pay | Admitting: Vascular Surgery

## 2018-12-16 DIAGNOSIS — E785 Hyperlipidemia, unspecified: Secondary | ICD-10-CM

## 2018-12-16 DIAGNOSIS — I739 Peripheral vascular disease, unspecified: Secondary | ICD-10-CM

## 2018-12-16 DIAGNOSIS — F17210 Nicotine dependence, cigarettes, uncomplicated: Secondary | ICD-10-CM

## 2018-12-16 DIAGNOSIS — N186 End stage renal disease: Secondary | ICD-10-CM

## 2018-12-16 DIAGNOSIS — T8753 Necrosis of amputation stump, right lower extremity: Principal | ICD-10-CM

## 2018-12-16 DIAGNOSIS — E1122 Type 2 diabetes mellitus with diabetic chronic kidney disease: Secondary | ICD-10-CM

## 2018-12-16 LAB — BASIC METABOLIC PANEL
Anion gap: 10 (ref 5–15)
BUN: 51 mg/dL — ABNORMAL HIGH (ref 8–23)
CO2: 24 mmol/L (ref 22–32)
Calcium: 8.9 mg/dL (ref 8.9–10.3)
Chloride: 103 mmol/L (ref 98–111)
Creatinine, Ser: 4.93 mg/dL — ABNORMAL HIGH (ref 0.61–1.24)
GFR calc Af Amer: 13 mL/min — ABNORMAL LOW (ref >60)
GFR calc non Af Amer: 11 mL/min — ABNORMAL LOW (ref >60)
Glucose, Bld: 131 mg/dL — ABNORMAL HIGH (ref 70–99)
Potassium: 4.2 mmol/L (ref 3.5–5.1)
Sodium: 137 mmol/L (ref 135–145)

## 2018-12-16 LAB — GLUCOSE, CAPILLARY
Glucose-Capillary: 73 mg/dL (ref 70–99)
Glucose-Capillary: 114 mg/dL — ABNORMAL HIGH (ref 70–99)
Glucose-Capillary: 123 mg/dL — ABNORMAL HIGH (ref 70–99)
Glucose-Capillary: 84 mg/dL (ref 70–99)

## 2018-12-16 LAB — CBC
HCT: 32.9 % — ABNORMAL LOW (ref 39.0–52.0)
Hemoglobin: 10.4 g/dL — ABNORMAL LOW (ref 13.0–17.0)
MCH: 32.4 pg (ref 26.0–34.0)
MCHC: 31.6 g/dL (ref 30.0–36.0)
MCV: 102.5 fL — ABNORMAL HIGH (ref 80.0–100.0)
Platelets: 133 10*3/uL — ABNORMAL LOW (ref 150–400)
RBC: 3.21 MIL/uL — ABNORMAL LOW (ref 4.22–5.81)
RDW: 17.4 % — ABNORMAL HIGH (ref 11.5–15.5)
WBC: 5.3 10*3/uL (ref 4.0–10.5)
nRBC: 0 % (ref 0.0–0.2)

## 2018-12-16 LAB — PHOSPHORUS: Phosphorus: 4.4 mg/dL (ref 2.5–4.6)

## 2018-12-16 LAB — PROTIME-INR
INR: 1.1 (ref 0.8–1.2)
Prothrombin Time: 14.3 seconds (ref 11.4–15.2)

## 2018-12-16 MED ORDER — CALCIUM ACETATE (PHOS BINDER) 667 MG PO CAPS
1334.0000 mg | ORAL_CAPSULE | Freq: Three times a day (TID) | ORAL | Status: DC
  Administered 2018-12-16 – 2018-12-21 (×11): 1334 mg via ORAL
  Filled 2018-12-16 (×11): qty 2

## 2018-12-16 MED ORDER — VANCOMYCIN HCL IN DEXTROSE 750-5 MG/150ML-% IV SOLN
750.0000 mg | INTRAVENOUS | Status: DC
  Administered 2018-12-16: 750 mg via INTRAVENOUS
  Filled 2018-12-16 (×3): qty 150

## 2018-12-16 MED ORDER — CHLORHEXIDINE GLUCONATE CLOTH 2 % EX PADS
6.0000 | MEDICATED_PAD | Freq: Every day | CUTANEOUS | Status: DC
  Administered 2018-12-16 – 2018-12-21 (×5): 6 via TOPICAL

## 2018-12-16 MED ORDER — HEPARIN SODIUM (PORCINE) 1000 UNIT/ML DIALYSIS
20.0000 [IU]/kg | INTRAMUSCULAR | Status: DC | PRN
  Filled 2018-12-16: qty 2

## 2018-12-16 MED ORDER — ENSURE PRE-SURGERY PO LIQD
296.0000 mL | Freq: Once | ORAL | Status: AC
  Administered 2018-12-16: 296 mL via ORAL
  Filled 2018-12-16: qty 296

## 2018-12-16 NOTE — Progress Notes (Signed)
Jose [name] at [place] NAME: Jose [name]    MR#:  [id number]  DATE OF BIRTH:  03-20-1946  SUBJECTIVE:   Patient presented to the hospital due to gangrene of the right foot and awaiting debridement.  Patient seen at dialysis tolerating it well.  Denies any other complaints.  He has some pain in the right foot.  REVIEW OF SYSTEMS:    Review of Systems  Constitutional: Negative for chills and fever.  HENT: Negative for congestion and tinnitus.   Eyes: Negative for blurred vision and double vision.  Respiratory: Negative for cough, shortness of breath and wheezing.   Cardiovascular: Negative for chest pain, orthopnea and PND.  Gastrointestinal: Negative for abdominal pain, diarrhea, nausea and vomiting.  Genitourinary: Negative for dysuria and hematuria.  Musculoskeletal: Positive for joint pain (Right foot pain).  Neurological: Negative for dizziness, sensory change and focal weakness.  All other systems reviewed and are negative.   Nutrition: Renal/Carb control diet.  Tolerating Diet: Yes Tolerating PT: Await Eval.   DRUG ALLERGIES:   Allergies  Allergen Reactions  . Penicillin G Other (See Comments)    Other reaction(s): Unknown DID THE REACTION INVOLVE: Swelling of the face/tongue/throat, SOB, or low BP? Unknown Sudden or severe rash/hives, skin peeling, or the inside of the mouth or nose? Unknown Did it require medical treatment? Unknown When did it last happen?unknown If all above answers are "NO", may proceed with cephalosporin use.     VITALS:  Blood pressure (!) 143/63, pulse 65, temperature 98.7 F (37.1 C), temperature source Oral, resp. rate 16, height 6\' 1"  (1.854 m), weight 67.8 kg, SpO2 100 %.  PHYSICAL EXAMINATION:   Physical Exam  GENERAL:  73 y.o.-year-old patient lying in bed in no acute distress.  EYES: Pupils equal, round, reactive to light and accommodation. No scleral icterus. Extraocular muscles  intact.  HEENT: Head atraumatic, normocephalic. Oropharynx and nasopharynx clear.  NECK:  Supple, no jugular venous distention. No thyroid enlargement, no tenderness.  LUNGS: Normal breath sounds bilaterally, no wheezing, rales, rhonchi. No use of accessory muscles of respiration.  CARDIOVASCULAR: S1, S2 normal. No murmurs, rubs, or gallops.  ABDOMEN: Soft, nontender, nondistended. Bowel sounds present. No organomegaly or mass.  EXTREMITIES: No cyanosis, clubbing or edema b/l.  Right foot ulcer with dressing in place.    NEUROLOGIC: Cranial nerves II through XII are intact. No focal Motor or sensory deficits b/l. Globally weak.  PSYCHIATRIC: The patient is alert and oriented x 3.  SKIN: No obvious rash, lesion, or ulcer.   Left upper Ext. AV fistula with good bruit/thrill.   LABORATORY PANEL:   CBC Recent Labs  Lab 12/16/18 0510  WBC 5.3  HGB 10.4*  HCT 32.9*  PLT 133*   ------------------------------------------------------------------------------------------------------------------  Chemistries  Recent Labs  Lab 12/15/18 1751 12/16/18 0510  NA 138 137  K 3.6 4.2  CL 103 103  CO2 26 24  GLUCOSE 96 131*  BUN 48* 51*  CREATININE 4.61* 4.93*  CALCIUM 8.9 8.9  AST 23  --   ALT 18  --   ALKPHOS 48  --   BILITOT 0.7  --    ------------------------------------------------------------------------------------------------------------------  Cardiac Enzymes No results for input(s): TROPONINI in the last 168 hours. ------------------------------------------------------------------------------------------------------------------  RADIOLOGY:  No results found.   ASSESSMENT AND PLAN:   73 year old male patient with a known history of diabetes mellitus type 2, glaucoma, gout, hyperlipidemia, hypertension, prostate cancer, ESRD on dialysis Tuesday Thursday Saturday, stroke  was referred from podiatry office due to worsening drainage and pain in the right foot.   1.  Gangrene  of the right foot-patient had recent debridement of the right foot ulcer but returns back due to worsening pain and drainage in the right foot. -Seen by podiatry and plan for further debridement and incision and drainage tomorrow. -Continue local wound care. -Continue broad-spectrum IV antibiotics with vancomycin and clindamycin for now.  2.  End-stage renal disease on hemodialysis- nephrology has been consulted. -Continue dialysis on a Tuesday Thursday Saturday schedule.  Patient had dialysis today.  3.  Essential hypertension-continue Norvasc, hydralazine, clonidine.  4.  History of gout-no acute attack.  Continue allopurinol.  5.  Diabetes type 2 with ESRD on dialysis-continue glipizide, sliding scale insulin.  6.  Tobacco abuse-continue nicotine patch.  7.  Diabetic neuropathy-continue gabapentin.  8.  Secondary hyperparathyroidism-continue PhosLo.     All the records are reviewed and case discussed with Care Management/Social Worker. Management plans discussed with the patient, family and they are in agreement.  CODE STATUS: Full code  DVT Prophylaxis: Hep. SQ  TOTAL TIME TAKING CARE OF THIS PATIENT: 30 minutes.   POSSIBLE D/C IN 2-3 DAYS, DEPENDING ON CLINICAL CONDITION.   Henreitta Leber M.D on 12/16/2018 at 2:57 PM  Between 7am to 6pm - Pager - [phone]  After 6pm go to [email or web address] - Proofreader  [facility]  Office  [phone]  CC: Primary care physician; [name], MD

## 2018-12-16 NOTE — Progress Notes (Addendum)
Pre-HD Tx Assessment:  Pt received in bed, A&O x4,  AVF to LUE intact; no s/s of infection. Pt c/o pain to right foot, site of amputation.      12/16/18 0910  Neurological  Level of Consciousness Alert  Orientation Level Oriented X4  Respiratory  Respiratory Pattern Regular;Unlabored;Symmetrical  Chest Assessment Chest expansion symmetrical  Bilateral Breath Sounds Clear  Cardiac  Pulse Regular  ECG Monitor Yes  Vascular  R Radial Pulse +2  L Radial Pulse +2  Psychosocial  Psychosocial (WDL) WDL

## 2018-12-16 NOTE — Consult Note (Signed)
[facility] Consult Note  MRN : [id number]  Jose [name] is a 73 y.o. (05/28/1946) male who presents with chief complaint of right foot infection.  History of Present Illness:  The patient is a 73 year old male with multiple medical issues including end-stage renal disease and peripheral artery disease who is well-known to our service who presents to the [facility] emergency department with a chief complaint of "right foot infection".  The patient was last seen in the outpatient setting by Dr. Lucky Cowboy on November 05, 2018.  Please refer to that note for that visit and assessment and plan.  Most recent intervention on Oct 07, 2018 was a left lower extremity angiogram with intervention.  The patient underwent a right lower extremity angiogram with intervention on September 30, 2018.  The patient underwent:  1. Ultrasound guidance for vascular access left femoral artery 2. Catheter placement into right SFA from left femoral approach 3. Aortogram and selective right lower extremity angiogram 4. Percutaneous transluminal angioplasty of right tibioperoneal trunk with 4 mm diameter by 10 cm length Lutonix drug-coated angioplasty balloon 5. StarClose closure device left femoral artery  Findings from that angiogram are as follows: This demonstrated no significant stenosis in the SFA or popliteal arteries.  The anterior tibial artery was occluded but the posterior tibial and peroneal arteries were continuous distally.  Those 2 vessels did not have any clear narrowing, but the tibioperoneal trunk did have a moderate stenosis in the 55 to 60% range  Patient presented to the [facility] emergency department stating that his right foot amputation site was "not healing correctly".  Patient endorses a history of bloody discharge from the incision site.  He denies any fever, nausea vomiting.   He denies any shortness of breath or chest pain.  He denies any issues with his dialysis access at this time.  The patient was seen by podiatry who be taken him to the operating room tomorrow for further debridement.  Vascular surgery was consulted by Dr. Vickki Muff for further recommendations Current Facility-Administered Medications  Medication Dose Route Frequency Provider Last Rate Last Dose  . 0.9 %  sodium chloride infusion  250 mL Intravenous PRN Pyreddy, Reatha Harps, MD      . acetaminophen (TYLENOL) tablet 650 mg  650 mg Oral Q6H PRN Pyreddy, Reatha Harps, MD       Or  . acetaminophen (TYLENOL) suppository 650 mg  650 mg Rectal Q6H PRN Pyreddy, Reatha Harps, MD      . allopurinol (ZYLOPRIM) tablet 100 mg  100 mg Oral Daily Pyreddy, Pavan, MD   100 mg at 12/16/18 1428  . amLODipine (NORVASC) tablet 10 mg  10 mg Oral Daily Pyreddy, Reatha Harps, MD   10 mg at 12/16/18 1427  . atorvastatin (LIPITOR) tablet 10 mg  10 mg Oral q1800 Pyreddy, Reatha Harps, MD      . calcium acetate (PHOSLO) capsule 1,334 mg  1,334 mg Oral TID WC Murlean Iba, MD   1,334 mg at 12/16/18 1428  . Chlorhexidine Gluconate Cloth 2 % PADS 6 each  6 each Topical Q0600 Murlean Iba, MD   6 each at 12/16/18 1144  . clindamycin (CLEOCIN) IVPB 600 mg  600 mg Intravenous Q8H Pyreddy, Pavan, MD 100 mL/hr at 12/16/18 1432 600 mg at 12/16/18 1432  . cloNIDine (CATAPRES) tablet 0.2 mg  0.2 mg Oral BID Saundra Shelling, MD   0.2 mg at 12/16/18 1427  . escitalopram (LEXAPRO) tablet 10 mg  10 mg Oral  Daily Saundra Shelling, MD   10 mg at 12/16/18 1428  . feeding supplement (NEPRO CARB STEADY) liquid 237 mL  237 mL Oral BID BM Pyreddy, Pavan, MD   237 mL at 12/16/18 1424  . gabapentin (NEURONTIN) capsule 300 mg  300 mg Oral QHS Saundra Shelling, MD   300 mg at 12/15/18 2149  . glipiZIDE (GLUCOTROL) tablet 5 mg  5 mg Oral QAC breakfast Pyreddy, Pavan, MD      . heparin injection 5,000 Units  5,000 Units Subcutaneous Q8H Tawnya Crook, RPH   5,000 Units at 12/16/18  0554  . hydrALAZINE (APRESOLINE) tablet 25 mg  25 mg Oral Q8H Pyreddy, Pavan, MD   25 mg at 12/16/18 1428  . HYDROcodone-acetaminophen (NORCO/VICODIN) 5-325 MG per tablet 1-2 tablet  1-2 tablet Oral Q4H PRN Saundra Shelling, MD   1 tablet at 12/15/18 2149  . insulin aspart (novoLOG) injection 0-20 Units  0-20 Units Subcutaneous TID WC Pyreddy, Pavan, MD      . insulin aspart (novoLOG) injection 0-5 Units  0-5 Units Subcutaneous QHS Pyreddy, Pavan, MD      . multivitamin (RENA-VIT) tablet 1 tablet  1 tablet Oral QHS Saundra Shelling, MD   1 tablet at 12/15/18 2149  . nicotine (NICODERM CQ - dosed in mg/24 hours) patch 21 mg  21 mg Transdermal Daily Pyreddy, Pavan, MD   21 mg at 12/16/18 1427  . ondansetron (ZOFRAN) tablet 4 mg  4 mg Oral Q6H PRN Pyreddy, Reatha Harps, MD       Or  . ondansetron (ZOFRAN) injection 4 mg  4 mg Intravenous Q6H PRN Pyreddy, Pavan, MD      . pneumococcal 23 valent vaccine (PNU-IMMUNE) injection 0.5 mL  0.5 mL Intramuscular Tomorrow-1000 Pyreddy, Pavan, MD      . senna-docusate (Senokot-S) tablet 1 tablet  1 tablet Oral QHS PRN Pyreddy, Reatha Harps, MD      . sodium chloride flush (NS) 0.9 % injection 3 mL  3 mL Intravenous Q12H Pyreddy, Pavan, MD      . sodium chloride flush (NS) 0.9 % injection 3 mL  3 mL Intravenous PRN Pyreddy, Pavan, MD      . vancomycin (VANCOCIN) IVPB 750 mg/150 ml premix  750 mg Intravenous Q T,Th,Sa-HD Rowland Lathe, RPH   Stopped at 12/16/18 1423   Past Medical History:  Diagnosis Date  . Chronic kidney disease   . Depression   . Diabetes mellitus without complication ([place])   . Glaucoma   . Glaucoma   . Gout   . Heart murmur   . HLD (hyperlipidemia)   . Hypertension   . Prostate cancer ([place])   . Renal failure    dialysis t/t/s  . Stroke ([place])    tia x 2   Past Surgical History:  Procedure Laterality Date  . AMPUTATION TOE Bilateral 07/23/2018   Procedure: TOE MPJ RIGHT 3RD AND LEFT 2ND;  Surgeon: Samara Deist, DPM;  Location: [facility] ORS;   Service: Podiatry;  Laterality: Bilateral;  . APPENDECTOMY    . AV FISTULA PLACEMENT Left 06/09/2018   Procedure: ARTERIOVENOUS (AV) FISTULA CREATION ( BRACHIAL CEPHALIC);  Surgeon: Algernon Huxley, MD;  Location: [facility] ORS;  Service: Vascular;  Laterality: Left;  . CHOLECYSTECTOMY    . DIALYSIS/PERMA CATHETER INSERTION N/A 03/24/2018   Procedure: DIALYSIS/PERMA CATHETER INSERTION;  Surgeon: Katha Cabal, MD;  Location: [place] [facility];  Service: Cardiovascular;  Laterality: N/A;  . DIALYSIS/PERMA CATHETER REMOVAL N/A 09/06/2018   Procedure: DIALYSIS/PERMA  CATHETER REMOVAL;  Surgeon: Algernon Huxley, MD;  Location: [place] [facility];  Service: Cardiovascular;  Laterality: N/A;  . INSERTION PROSTATE RADIATION SEED    . LOWER EXTREMITY ANGIOGRAPHY Right 06/16/2018   Procedure: LOWER EXTREMITY ANGIOGRAPHY;  Surgeon: Algernon Huxley, MD;  Location: [place] [facility];  Service: Cardiovascular;  Laterality: Right;  . LOWER EXTREMITY ANGIOGRAPHY Left 08/30/2018   Procedure: LOWER EXTREMITY ANGIOGRAPHY;  Surgeon: Algernon Huxley, MD;  Location: [place] [facility];  Service: Cardiovascular;  Laterality: Left;  . LOWER EXTREMITY ANGIOGRAPHY Right 09/30/2018   Procedure: LOWER EXTREMITY ANGIOGRAPHY;  Surgeon: Algernon Huxley, MD;  Location: [place] [facility];  Service: Cardiovascular;  Laterality: Right;  . LOWER EXTREMITY ANGIOGRAPHY Left 10/07/2018   Procedure: LOWER EXTREMITY ANGIOGRAPHY;  Surgeon: Algernon Huxley, MD;  Location: [place] [facility];  Service: Cardiovascular;  Laterality: Left;  . TRANSMETATARSAL AMPUTATION Bilateral 10/13/2018   Procedure: 1.  Amputation right great toe MTPJ 2.  Amputation right second toe MTPJ 3.  Amputation right fourth toe MTPJ 4.  Amputation right fifth toe MTPJ 5.  Excision distal second metatarsal head right foot 6.  Excision distal third metatarsal head right foot 7.  Amputation left third toe 8.  Incision and drainage infectionleft 2nd toe amputation site. ;  Surgeon:  Samara Deist, DPM;  Location   Social History Social History   Tobacco Use  . Smoking status: Current Some Day Smoker    Packs/day: 0.10    Types: Cigarettes  . Smokeless tobacco: Never Used  Substance Use Topics  . Alcohol use: Not Currently    Alcohol/week: 0.0 standard drinks    Frequency: Never  . Drug use: Never   Family History Family History  Problem Relation Age of Onset  . Varicose Veins Neg Hx   Denies family history of peripheral artery disease, venous disease and bleeding/clotting disorders.  Allergies  Allergen Reactions  . Penicillin G Other (See Comments)    Other reaction(s): Unknown DID THE REACTION INVOLVE: Swelling of the face/tongue/throat, SOB, or low BP? Unknown Sudden or severe rash/hives, skin peeling, or the inside of the mouth or nose? Unknown Did it require medical treatment? Unknown When did it last happen?unknown If all above answers are "NO", may proceed with cephalosporin use.    REVIEW OF SYSTEMS (Negative unless checked)  Constitutional: [] Weight loss  [] Fever  [] Chills Cardiac: [] Chest pain   [] Chest pressure   [] Palpitations   [] Shortness of breath when laying flat   [] Shortness of breath at rest   [] Shortness of breath with exertion. Vascular:  [] Pain in legs with walking   [] Pain in legs at rest   [] Pain in legs when laying flat   [] Claudication   [x] Pain in feet when walking  [x] Pain in feet at rest  [x] Pain in feet when laying flat   [] History of DVT   [] Phlebitis   [] Swelling in legs   [] Varicose veins   [] Non-healing ulcers Pulmonary:   [] Uses home oxygen   [] Productive cough   [] Hemoptysis   [] Wheeze  [] COPD   [] Asthma Neurologic:  [] Dizziness  [] Blackouts   [] Seizures   [] History of stroke   [] History of TIA  [] Aphasia   [] Temporary blindness   [] Dysphagia   [] Weakness or numbness in arms   [] Weakness or numbness in legs Musculoskeletal:  [] Arthritis   [] Joint swelling   [] Joint pain   [] Low back pain Hematologic:  [] Easy bruising   [] Easy bleeding   [] Hypercoagulable state   []   Anemic  [] Hepatitis Gastrointestinal:  [] Blood in stool   [] Vomiting blood  [] Gastroesophageal reflux/heartburn   [] Difficulty swallowing. Genitourinary:  [x] Chronic kidney disease   [] Difficult urination  [] Frequent urination  [] Burning with urination   [] Blood in urine Skin:  [] Rashes   [] Ulcers   [] Wounds Psychological:  [] History of anxiety   []  History of major depression.  Physical Examination  Vitals:   12/16/18 1315 12/16/18 1330 12/16/18 1345 12/16/18 1423  BP: (!) 131/59 (!) 135/59 125/60 (!) 143/63  Pulse: (!) 50 (!) 55 (!) 51 65  Resp:   15 16  Temp:   98.7 F (37.1 C) 98.7 F (37.1 C)  TempSrc:   Oral Oral  SpO2: 100% 98% 99% 100%  Weight:      Height:       Body mass index is 19.72 kg/m. Gen:  WD/WN, NAD Head: [place]/AT, No temporalis wasting. Prominent temp pulse not noted. Ear/Nose/Throat: Hearing grossly intact, nares w/o erythema or drainage, oropharynx w/o Erythema/Exudate Eyes: Sclera non-icteric, conjunctiva clear Neck: Trachea midline.  No JVD.  Pulmonary:  Good air movement, respirations not labored, equal bilaterally.  Cardiac: RRR, normal S1, S2. Vascular:  Vessel Right Left  Radial Palpable Palpable  Ulnar Palpable Palpable  Brachial Palpable Palpable  Carotid Palpable, without bruit Palpable, without bruit  Aorta Not palpable N/A  Femoral Palpable Palpable  Popliteal Palpable Palpable  PT Non-Palpable Non-Palpable  DP Non-Palpable Non-Palpable   Left upper extremity: Dialysis access with good bruit and thrill.  Skin intact.  Right lower extremity: Right foot - foul odor noted.  Necrosis noted to the distal aspect of the amputation site.  Gastrointestinal: soft, non-tender/non-distended. No guarding/reflex.  Musculoskeletal: M/S 5/5 throughout. No edema. Neurologic: Sensation grossly intact in extremities.  Symmetrical.  Speech is fluent. Motor exam as listed above. Psychiatric: Judgment intact, Mood  & affect appropriate for pt's clinical situation. Dermatologic: As above Lymph : No Cervical, Axillary, or Inguinal lymphadenopathy.  CBC Lab Results  Component Value Date   WBC 5.3 12/16/2018   HGB 10.4 (L) 12/16/2018   HCT 32.9 (L) 12/16/2018   MCV 102.5 (H) 12/16/2018   PLT 133 (L) 12/16/2018   BMET    Component Value Date/Time   NA 137 12/16/2018 0510   NA 141 08/18/2011 1054   K 4.2 12/16/2018 0510   K 4.5 08/18/2011 1054   CL 103 12/16/2018 0510   CL 108 (H) 08/18/2011 1054   CO2 24 12/16/2018 0510   CO2 23 08/18/2011 1054   GLUCOSE 131 (H) 12/16/2018 0510   GLUCOSE 119 (H) 08/18/2011 1054   BUN 51 (H) 12/16/2018 0510   BUN 25 (H) 08/18/2011 1054   CREATININE 4.93 (H) 12/16/2018 0510   CREATININE 2.10 (H) 08/18/2011 1054   CALCIUM 8.9 12/16/2018 0510   CALCIUM 9.0 08/18/2011 1054   GFRNONAA 11 (L) 12/16/2018 0510   GFRNONAA 34 (L) 08/18/2011 1054   GFRAA 13 (L) 12/16/2018 0510   GFRAA 41 (L) 08/18/2011 1054   Estimated Creatinine Clearance: 13 mL/min (A) (by C-G formula based on SCr of 4.93 mg/dL (H)).  COAG Lab Results  Component Value Date   INR 1.1 12/16/2018   INR 1.15 06/13/2018   INR 1.02 05/28/2018   Radiology No results found.  Assessment/Plan The patient is a 73 year old male with multiple medical issues including end-stage renal disease and peripheral artery disease who is well-known to our service who presents to the [facility] emergency department with a chief complaint of "right foot  infection". 1.  Right foot wound dehiscence / ganrene: During the patient's most recent right lower extremity angiogram was found to have mostly small vessel disease to the foot.  I had a long discussion with the patient in regard to what small vessel disease in the foot means (the anatomy does not allow for stenting or ballooning of the smaller vessels in the foot and toes).  He expresses understanding that undergoing a repeat right lower  extremity angiogram may not offer a significant improvement in regard to the blood flow to his right foot.  The patient would still like to move forward.  He would like for [place] to do this on Monday.  The patient is asking to be discharged home after his surgery and return to the hospital Monday for the angiogram.  As long as the patient is medically stable he can return on Monday for his angiogram.  2.  End-stage renal disease: Patient with a left upper extremity dialysis access.  At this time, the access seems to be functioning well and the patient is without complaint.  Unremarkable physical exam. 3.  Diabetes: On appropriate medications. Encouraged good control as its slows the progression of atherosclerotic disease. 4.  Hyperlipidemia: On statin for medical management. Encouraged good control as its slows the progression of atherosclerotic disease.  Discussed with Dr. Mayme Genta, PA-C  12/16/2018 3:28 PM    This note was created with Dragon medical transcription system.  Any error is purely unintentional

## 2018-12-16 NOTE — TOC Initial Note (Signed)
Transition of Care [facility]) - Initial/Assessment Note    Patient Details  Name: Jose [name] MRN: [id number] Date of Birth: 05-01-1946  Transition of Care [facility]) CM/SW Contact:    [name], RN Phone Number: 12/16/2018, 3:04 PM  Clinical Narrative:                  Patient admitted gangrenous changes with areas of infection and necrosis on his right foot.  He was admitted for antibiotics and will need debridement of the right foot per podiatry  RNCM to complete full assessment on patient for high risk for readmission.    [name] dialysis liaison notified of admission.    Patient previously was discharged to [facility] in May of 2020  Patient currently open with [facility] home [facility] for RN and PT. [name] with [facility] notified of admission.   Will benefit PT eval prior to discharge       Patient Goals and CMS Choice        Expected Discharge Plan and Services           Expected Discharge Date: 12/17/18                                    Prior Living Arrangements/Services                       Activities of Daily Living Home Assistive Devices/Equipment: Kasandra Knudsen (specify quad or straight) ADL Screening (condition at time of admission) Patient's cognitive ability adequate to safely complete daily activities?: Yes Is the patient deaf or have difficulty hearing?: No Does the patient have difficulty seeing, even when wearing glasses/contacts?: No Does the patient have difficulty concentrating, remembering, or making decisions?: No Patient able to express need for assistance with ADLs?: Yes Does the patient have difficulty dressing or bathing?: No Independently performs ADLs?: Yes (appropriate for developmental age) Does the patient have difficulty walking or climbing stairs?: Yes Weakness of Legs: Both Weakness of Arms/Hands: None  Permission Sought/Granted                  Emotional Assessment              Admission  diagnosis:  PVD Patient Active Problem List   Diagnosis Date Noted  . Foot ulcer ([place]) 12/15/2018  . Ischemic foot 10/11/2018  . Atherosclerosis of native arteries of the extremities with gangrene ([place]) 07/09/2018  . Gangrene of toe of right foot ([place]) 06/13/2018  . Diabetes mellitus ([place]) 04/16/2018  . Hypertension 04/16/2018  . ESRD on dialysis ([place]) 04/16/2018  . Malnutrition of moderate degree 03/27/2018  . Renal failure 03/21/2018   PCP:  [name], MD Pharmacy:   [id number] [place], [address] [place] [address] Phone: [phone] Fax: [phone]     Social Determinants of Health (SDOH) Interventions    Readmission Risk Interventions Readmission Risk Prevention Plan 10/15/2018 10/14/2018 10/12/2018  Transportation Screening Complete Complete -  PCP or Specialist Appt within 3-5 Days (No Data) - -  HRI or [place] - Complete Complete  Social Work Consult for [place] Planning/Counseling - Not Complete -  Palliative Care Screening - Not Applicable -  Medication Review Press photographer) - Complete Complete  Some recent data might be hidden

## 2018-12-16 NOTE — Progress Notes (Signed)
Daily Progress Note   Subjective  - * No surgery date entered *  Follow-up right foot necrotic tissue and infection.  Objective Vitals:   12/16/18 1315 12/16/18 1330 12/16/18 1345 12/16/18 1423  BP: (!) 131/59 (!) 135/59 125/60 (!) 143/63  Pulse: (!) 50 (!) 55 (!) 51 65  Resp: 17  15 16   Temp: 98.7 F (37.1 C)  98.7 F (37.1 C) 98.7 F (37.1 C)  TempSrc: Oral  Oral Oral  SpO2: 100% 98% 99% 100%  Weight:      Height:        Physical Exam: Bandage intact.  Left foot without open draining wound.  Vascular has evaluated and are planning on angios next week.  Laboratory CBC    Component Value Date/Time   WBC 5.3 12/16/2018 0510   HGB 10.4 (L) 12/16/2018 0510   HGB 13.6 08/18/2011 1054   HCT 32.9 (L) 12/16/2018 0510   PLT 133 (L) 12/16/2018 0510    BMET    Component Value Date/Time   NA 137 12/16/2018 0510   NA 141 08/18/2011 1054   K 4.2 12/16/2018 0510   K 4.5 08/18/2011 1054   CL 103 12/16/2018 0510   CL 108 (H) 08/18/2011 1054   CO2 24 12/16/2018 0510   CO2 23 08/18/2011 1054   GLUCOSE 131 (H) 12/16/2018 0510   GLUCOSE 119 (H) 08/18/2011 1054   BUN 51 (H) 12/16/2018 0510   BUN 25 (H) 08/18/2011 1054   CREATININE 4.93 (H) 12/16/2018 0510   CREATININE 2.10 (H) 08/18/2011 1054   CALCIUM 8.9 12/16/2018 0510   CALCIUM 9.0 08/18/2011 1054   GFRNONAA 11 (L) 12/16/2018 0510   GFRNONAA 34 (L) 08/18/2011 1054   GFRAA 13 (L) 12/16/2018 0510   GFRAA 41 (L) 08/18/2011 1054    Assessment/Planning: Gangrene right foot   Wound culture from outpatient clinic is still pending.  We will plan for surgical debridement and primary closure of wound on his right distal foot.  Possible removal of bone if needed.  I discussed this with the patient in detail and consent was given verbally.  Orders have been placed.  We will plan for surgery tomorrow morning.  Samara Deist A  12/16/2018, 5:21 PM

## 2018-12-16 NOTE — Progress Notes (Addendum)
Pre- HD Tx Note:    12/16/18 0910  Vital Signs  Temp 97.9 F (36.6 C)  Temp Source Oral  Pulse Rate (!) 51  Pulse Rate Source Monitor  Resp 14  BP (!) 127/56  BP Location Right Arm  BP Method Automatic  Patient Position (if appropriate) Lying  Oxygen Therapy  SpO2 97 %  O2 Device Room Air  Patient Activity (if Appropriate) In bed  Pulse Oximetry Type Continuous  Pain Assessment  Pain Scale 0-10  Pain Score 0  Dialysis Weight  Weight 67.8 kg  Type of Weight Pre-Dialysis  Time-Out for Hemodialysis  What Procedure? HD  Pt Identifiers(min of two) First/Last Name;MRN/Account#;Pt's DOB(use if MRN/Acct# not available  Correct Site? Yes  Correct Side? Yes  Correct Procedure? Yes  Consents Verified? Yes  Rad Studies Available? N/A  Safety Precautions Reviewed? Yes  Engineer, civil (consulting) Number 3  Station Number 2  UF/Alarm Test Passed  Conductivity: Meter 13.8  Conductivity: Machine  14  pH 7.4  Reverse Osmosis main  Normal Saline Lot Number [id number]  Dialyzer Lot Number [id number]  Disposable Set Lot Number [id number]  Machine Temperature 98.6 F (37 C)  Musician and Audible Yes  Blood Lines Intact and Secured Yes  Pre Treatment Patient Checks  Vascular access used during treatment Fistula  HD catheter dressing before treatment  (n/a)  Hepatitis B Surface Antigen Results Negative  Date Hepatitis B Surface Antigen Drawn 03/22/18  Isolation Initiated  (n/a)  Hepatitis B Surface Antibody  (>10)  Date Hepatitis B Surface Antibody Drawn 03/22/18  Hemodialysis Consent Verified Yes  Hemodialysis Standing Orders Initiated Yes  ECG (Telemetry) Monitor On Yes  Prime Ordered Normal Saline  Length of  DialysisTreatment -hour(s) 3.5 Hour(s)  Dialysis Treatment Comments  (Na 140)  Dialyzer Elisio 17H NR  Dialysate 3K;2.5 Ca  Dialysate Flow Ordered 800  Blood Flow Rate Ordered 400 mL/min  Ultrafiltration Goal 1000 Liters  Pre Treatment Labs Renal  panel;Phosphorus  Dialysis Blood Pressure Support Ordered Normal Saline  Education / Care Plan  Dialysis Education Provided Yes  Documented Education in Care Plan Yes  Outpatient Plan of Care Reviewed and on Chart Yes

## 2018-12-16 NOTE — Progress Notes (Signed)
HD Tx Initiation:    12/16/18 0930  Vital Signs  Temp 97.9 F (36.6 C)  Temp Source Oral  Pulse Rate (!) 41  Pulse Rate Source Monitor  Resp 13  BP 122/61  BP Location Right Arm  BP Method Automatic  Patient Position (if appropriate) Lying  Oxygen Therapy  SpO2 95 %  O2 Device Room Air  Patient Activity (if Appropriate) In bed  Pulse Oximetry Type Continuous  Pain Assessment  Pain Scale 0-10  Pain Score 0  Dialysis Weight  Weight 67.8 kg  Type of Weight Pre-Dialysis  During Hemodialysis Assessment  Blood Flow Rate (mL/min) 400 mL/min  Arterial Pressure (mmHg) -120 mmHg  Venous Pressure (mmHg) 160 mmHg  Transmembrane Pressure (mmHg) 60 mmHg  Ultrafiltration Rate (mL/min) 1 mL/min  Dialysate Flow Rate (mL/min) 800 ml/min  Conductivity: Machine  14.1  HD Safety Checks Performed Yes  Intra-Hemodialysis Comments Tx initiated

## 2018-12-16 NOTE — Progress Notes (Signed)
Informed Hospitalist, Dr. Gerrit [email or web address], BUN of 51. No new orders received.

## 2018-12-16 NOTE — Progress Notes (Signed)
Post HD Tx Assessment:    12/16/18 1345  Neurological  Level of Consciousness Alert  Orientation Level Oriented X4  Respiratory  Respiratory Pattern Regular;Unlabored  Chest Assessment Chest expansion symmetrical  Bilateral Breath Sounds Clear  Cardiac  Pulse Regular  Jugular Venous Distention (JVD) No  ECG Monitor Yes  Cardiac Rhythm SB  Vascular  R Radial Pulse +2  L Radial Pulse +2  Psychosocial  Psychosocial (WDL) WDL

## 2018-12-16 NOTE — Progress Notes (Signed)
[facility], [place] 12/16/18  Subjective:   LOS: 1 07/15 0701 - 07/16 0700 In: 218.8 [P.O.:120; IV Piggyback:98.8] Out: 65 [Urine:50] Patient known to our practice from outpatient dialysis He presents for areas of drainage from his right foot Admitted for antibiotics and likely further debridement Seen at the beginning of dialysis.  Tolerating well.  Denies any fevers, chills, nausea or vomiting.  Had loose stools last week but resolving now.  No shortness of breath, currently on room air  Objective:  Vital signs in last 24 hours:  Temp:  [97.7 F (36.5 C)-98.9 F (37.2 C)] 97.7 F (36.5 C) (07/16 0427) Pulse Rate:  [55-58] 58 (07/16 0427) Resp:  [16] 16 (07/16 0427) BP: (115-130)/(66-71) 115/66 (07/16 0427) SpO2:  [100 %] 100 % (07/16 0427) Weight:  [67.8 kg] 67.8 kg (07/15 1637)  Weight change:  Filed Weights   12/15/18 1637  Weight: 67.8 kg    Intake/Output:    Intake/Output Summary (Last 24 hours) at 12/16/2018 0930 Last data filed at 12/16/2018 0500 Gross per 24 hour  Intake 218.8 ml  Output 50 ml  Net 168.8 ml    Gen:   Alert, cooperative, no distress,  Head:   Normocephalic, without obvious abnormality, atraumatic Eyes/ENT:  conjunctiva/corneas clear,  moist oral mucus membranes Neck:  Supple,  thyroid: not enlarged, no JVD Back:    no CVA tenderness Lungs:   Clear to auscultation bilaterally, respirations unlabored Heart:   Atrial flutter on tele, no rub or gallop Abdomen:   Soft, non-tender, bowel sounds active  Extremities: no cyanosis or edema Skin:  Skin color, texture, turgor normal  Neurologic: Alert and oriented, able to answer questions appropriately Left forearm AVF     Basic Metabolic Panel:  Recent Labs  Lab 12/12/18 0817 12/15/18 1751 12/16/18 0510  NA 138 138 137  K 3.9 3.6 4.2  CL 96* 103 103  CO2 30 26 24   GLUCOSE 151* 96 131*  BUN 34* 48* 51*  CREATININE 4.14* 4.61* 4.93*  CALCIUM 8.9 8.9 8.9      CBC: Recent Labs  Lab 12/12/18 0817 12/15/18 1751 12/16/18 0510  WBC 7.0 6.9 5.3  NEUTROABS 4.3  --   --   HGB 11.9* 11.4* 10.4*  HCT 37.0* 34.4* 32.9*  MCV 101.1* 100.6* 102.5*  PLT 171 165 133*      Lab Results  Component Value Date   HEPBSAG Negative 03/22/2018   HEPBSAB Reactive 03/22/2018   HEPBIGM Negative 03/22/2018      Microbiology:  Recent Results (from the past 240 hour(s))  SARS Coronavirus 2 (CEPHEID - Performed in [place] hospital lab), Hosp Order     Status: None   Collection Time: 12/15/18  4:53 PM   Specimen: Nasopharyngeal Swab  Result Value Ref Range Status   SARS Coronavirus 2 NEGATIVE NEGATIVE Final    Comment: (NOTE) If result is NEGATIVE SARS-CoV-2 target nucleic acids are NOT DETECTED. The SARS-CoV-2 RNA is generally detectable in upper and lower  respiratory specimens during the acute phase of infection. The lowest  concentration of SARS-CoV-2 viral copies this assay can detect is 250  copies / mL. A negative result does not preclude SARS-CoV-2 infection  and should not be used as the sole basis for treatment or other  patient management decisions.  A negative result may occur with  improper specimen collection / handling, submission of specimen other  than nasopharyngeal swab, presence of viral mutation(s) within the  areas targeted by this  assay, and inadequate number of viral copies  (<250 copies / mL). A negative result must be combined with clinical  observations, patient history, and epidemiological information. If result is POSITIVE SARS-CoV-2 target nucleic acids are DETECTED. The SARS-CoV-2 RNA is generally detectable in upper and lower  respiratory specimens dur ing the acute phase of infection.  Positive  results are indicative of active infection with SARS-CoV-2.  Clinical  correlation with patient history and other diagnostic information is  necessary to determine patient infection status.  Positive results do   not rule out bacterial infection or co-infection with other viruses. If result is PRESUMPTIVE POSTIVE SARS-CoV-2 nucleic acids MAY BE PRESENT.   A presumptive positive result was obtained on the submitted specimen  and confirmed on repeat testing.  While 2019 novel coronavirus  (SARS-CoV-2) nucleic acids may be present in the submitted sample  additional confirmatory testing may be necessary for epidemiological  and / or clinical management purposes  to differentiate between  SARS-CoV-2 and other Sarbecovirus currently known to infect humans.  If clinically indicated additional testing with an alternate test  methodology [phone]) is advised. The SARS-CoV-2 RNA is generally  detectable in upper and lower respiratory sp ecimens during the acute  phase of infection. The expected result is Negative. Fact Sheet for Patients:  StrictlyIdeas.no Fact Sheet for Healthcare Providers: BankingDealers.co.za This test is not yet approved or cleared by the [place] FDA and has been authorized for detection and/or diagnosis of SARS-CoV-2 by FDA under an Emergency Use Authorization (EUA).  This EUA will remain in effect (meaning this test can be used) for the duration of the COVID-19 declaration under Section 564(b)(1) of the Act, 21 U.S.C. section 360bbb-3(b)(1), unless the authorization is terminated or revoked sooner. Performed at [facility], [address] [place], [place] [address]   MRSA PCR Screening     Status: None   Collection Time: 12/15/18  4:53 PM   Specimen: Nasal Mucosa; Nasopharyngeal  Result Value Ref Range Status   MRSA by PCR NEGATIVE NEGATIVE Final    Comment:        The GeneXpert MRSA Assay (FDA approved for NASAL specimens only), is one component of a comprehensive MRSA colonization surveillance program. It is not intended to diagnose MRSA infection nor to guide or monitor treatment for MRSA  infections. Performed at [facility], [address] [place], [place] [address]   CULTURE, BLOOD (ROUTINE X 2) w Reflex to ID Panel     Status: None (Preliminary result)   Collection Time: 12/15/18  5:51 PM   Specimen: BLOOD  Result Value Ref Range Status   Specimen Description BLOOD BLOOD RIGHT HAND  Final   Special Requests   Final    BOTTLES DRAWN AEROBIC AND ANAEROBIC Blood Culture adequate volume   Culture   Final    NO GROWTH < 24 HOURS Performed at [facility], [address] [place], [place] [address]    Report Status PENDING  Incomplete  CULTURE, BLOOD (ROUTINE X 2) w Reflex to ID Panel     Status: None (Preliminary result)   Collection Time: 12/15/18  5:51 PM   Specimen: BLOOD  Result Value Ref Range Status   Specimen Description BLOOD BLOOD RIGHT WRIST  Final   Special Requests   Final    BOTTLES DRAWN AEROBIC AND ANAEROBIC Blood Culture adequate volume   Culture   Final    NO GROWTH < 24 HOURS Performed at [facility], [place]  [address] [place], [place] [address]    Report Status PENDING  Incomplete    Coagulation Studies: Recent Labs    12/16/18 0510  LABPROT 14.3  INR 1.1    Urinalysis: No results for input(s): COLORURINE, LABSPEC, PHURINE, GLUCOSEU, HGBUR, BILIRUBINUR, KETONESUR, PROTEINUR, UROBILINOGEN, NITRITE, LEUKOCYTESUR in the last 72 hours.  Invalid input(s): APPERANCEUR    Imaging: No results found.   Medications:   . sodium chloride    . clindamycin (CLEOCIN) IV 600 mg (12/16/18 0752)  . vancomycin     . allopurinol  100 mg Oral Daily  . amLODipine  10 mg Oral Daily  . atorvastatin  10 mg Oral q1800  . Chlorhexidine Gluconate Cloth  6 each Topical Q0600  . cloNIDine  0.2 mg Oral BID  . escitalopram  10 mg Oral Daily  . feeding supplement (NEPRO CARB STEADY)  237 mL Oral BID BM  . gabapentin  300 mg Oral QHS  . glipiZIDE  5 mg Oral QAC breakfast  . heparin injection (subcutaneous)  5,000 Units  Subcutaneous Q8H  . hydrALAZINE  25 mg Oral Q8H  . insulin aspart  0-20 Units Subcutaneous TID WC  . insulin aspart  0-5 Units Subcutaneous QHS  . multivitamin  1 tablet Oral QHS  . nicotine  21 mg Transdermal Daily  . pneumococcal 23 valent vaccine  0.5 mL Intramuscular Tomorrow-1000  . sodium chloride flush  3 mL Intravenous Q12H   sodium chloride, acetaminophen **OR** acetaminophen, heparin, HYDROcodone-acetaminophen, ondansetron **OR** ondansetron (ZOFRAN) IV, senna-docusate, sodium chloride flush  Assessment/ Plan:  73 y.o. African-American male with diabetes, end-stage renal disease, history of prostate cancer, history of stroke, peripheral vascular disease, history of bilateral transmetatarsal amputation  Active Problems:   Foot ulcer ([place])  [facility] TuThSa-2 TW 65 kg  #.  End-stage renal disease.  Hemodialysis TTS schedule Recent Labs    12/12/18 0817 12/15/18 1751 12/16/18 0510  CREATININE 4.14* 4.61* 4.93*  Patient seen during dialysis.  Tolerating well  #. Anemia of CKD  Lab Results  Component Value Date   HGB 10.4 (L) 12/16/2018  Will continue on low-dose Epogen  #. SHPTH    Component Value Date/Time   PTH 226 (H) 03/25/2018 1557   Lab Results  Component Value Date   PHOS 6.1 (H) 06/17/2018   PHOS 5.7 (H) 06/17/2018  will start low dose PhosLo  #. HTN with CKD Requiring multiple anti hypertensives  #. Diabetes type 2 with CKD Hgb A1c MFr Bld (%)  Date Value  12/15/2018 5.6   # Foot infection -Currently being treated with IV vancomycin.  Surgical debridement planned by podiatrist.   LOS: [place] 7/16/20209:30 [place], [place]

## 2018-12-16 NOTE — Progress Notes (Signed)
HD Tx Completion:     12/16/18 1315  Vital Signs  Temp 98.7 F (37.1 C)  Temp Source Oral  Pulse Rate (!) 50  Pulse Rate Source Monitor  Resp 17  BP (!) 131/59  BP Location Right Arm  BP Method Automatic  Patient Position (if appropriate) Lying  Oxygen Therapy  SpO2 100 %  O2 Device Room Air  Patient Activity (if Appropriate) In bed  Pulse Oximetry Type Continuous  Pain Assessment  Pain Scale 0-10  Pain Score 0  Post-Hemodialysis Assessment  Rinseback Volume (mL) 250 mL  KECN 80.7 V  Dialyzer Clearance Lightly streaked  Duration of HD Treatment -hour(s) 3.5 hour(s)  Hemodialysis Intake (mL) 500 mL  UF Total -Machine (mL) 1500 mL  Net UF (mL) 1000 mL  Tolerated HD Treatment Yes  AVG/AVF Arterial Site Held (minutes)  (10)  AVG/AVF Venous Site Held (minutes)  (18)  Education / Care Plan  Dialysis Education Provided Yes  Documented Education in Care Plan Yes  Outpatient Plan of Care Reviewed and on Chart Yes

## 2018-12-16 NOTE — Progress Notes (Signed)
Post HD Tx Note:    12/16/18 1345  Vital Signs  Temp 98.7 F (37.1 C)  Temp Source Oral  Pulse Rate (!) 51  Pulse Rate Source Monitor  Resp 15  BP 125/60  BP Location Right Arm  BP Method Automatic  Patient Position (if appropriate) Lying  Oxygen Therapy  SpO2 99 %  O2 Device Room Air  Patient Activity (if Appropriate) In bed  Pulse Oximetry Type Continuous  Pain Assessment  Pain Scale 0-10  Pain Score 0  Post-Hemodialysis Assessment  Rinseback Volume (mL) 250 mL  KECN 80.7 V  Dialyzer Clearance Lightly streaked  Duration of HD Treatment -hour(s) 3.5 hour(s)  Hemodialysis Intake (mL) 500 mL  UF Total -Machine (mL) 1500 mL  Net UF (mL) 1000 mL  Tolerated HD Treatment Yes  AVG/AVF Arterial Site Held (minutes)  (10)  AVG/AVF Venous Site Held (minutes)  (18)  Education / Care Plan  Dialysis Education Provided Yes  Documented Education in Care Plan Yes  Outpatient Plan of Care Reviewed and on Chart Yes

## 2018-12-17 ENCOUNTER — Inpatient Hospital Stay: Payer: Medicare Other | Admitting: Anesthesiology

## 2018-12-17 ENCOUNTER — Encounter
Admission: AD | Disposition: A | Discharge status: Skilled Nursing Facility | Payer: Self-pay | Source: Ambulatory Visit | Attending: Internal Medicine

## 2018-12-17 ENCOUNTER — Encounter: Payer: Self-pay | Admitting: *Deleted

## 2018-12-17 HISTORY — PX: IRRIGATION AND DEBRIDEMENT FOOT: SHX6602

## 2018-12-17 LAB — GLUCOSE, CAPILLARY
Glucose-Capillary: 79 mg/dL (ref 70–99)
Glucose-Capillary: 82 mg/dL (ref 70–99)
Glucose-Capillary: 83 mg/dL (ref 70–99)
Glucose-Capillary: 118 mg/dL — ABNORMAL HIGH (ref 70–99)
Glucose-Capillary: 93 mg/dL (ref 70–99)

## 2018-12-17 SURGERY — IRRIGATION AND DEBRIDEMENT FOOT
Anesthesia: General | Site: Foot | Laterality: Right

## 2018-12-17 MED ORDER — BUPIVACAINE HCL 0.5 % IJ SOLN: INTRAMUSCULAR | Status: DC | PRN

## 2018-12-17 MED ORDER — FENTANYL CITRATE (PF) 100 MCG/2ML IJ SOLN: 25.0000 ug | INTRAMUSCULAR | Status: DC | PRN

## 2018-12-17 MED ORDER — PROPOFOL 10 MG/ML IV BOLUS
INTRAVENOUS | Status: DC | PRN
  Administered 2018-12-17: 150 mg via INTRAVENOUS

## 2018-12-17 MED ORDER — PROPOFOL 10 MG/ML IV BOLUS
INTRAVENOUS | Status: AC
  Filled 2018-12-17: qty 20

## 2018-12-17 MED ORDER — MORPHINE SULFATE (PF) 4 MG/ML IV SOLN: 1.0000 mg | INTRAVENOUS | Status: DC | PRN

## 2018-12-17 MED ORDER — PROPOFOL 500 MG/50ML IV EMUL
INTRAVENOUS | Status: AC
  Filled 2018-12-17: qty 50

## 2018-12-17 MED ORDER — EPHEDRINE SULFATE 50 MG/ML IJ SOLN
INTRAMUSCULAR | Status: DC | PRN
  Administered 2018-12-17: 10 mg via INTRAVENOUS

## 2018-12-17 MED ORDER — BUPIVACAINE HCL (PF) 0.25 % IJ SOLN
INTRAMUSCULAR | Status: DC | PRN
  Administered 2018-12-17: 8 mL
  Administered 2018-12-17: 10 mL

## 2018-12-17 MED ORDER — FENTANYL CITRATE (PF) 100 MCG/2ML IJ SOLN
INTRAMUSCULAR | Status: AC
  Filled 2018-12-17: qty 2

## 2018-12-17 MED ORDER — ONDANSETRON HCL 4 MG/2ML IJ SOLN: 4.0000 mg | Freq: Once | INTRAMUSCULAR | Status: DC | PRN

## 2018-12-17 MED ORDER — BUPIVACAINE LIPOSOME 1.3 % IJ SUSP
INTRAMUSCULAR | Status: DC | PRN
  Administered 2018-12-17: 10 mL

## 2018-12-17 MED ORDER — FENTANYL CITRATE (PF) 100 MCG/2ML IJ SOLN
INTRAMUSCULAR | Status: DC | PRN
  Administered 2018-12-17: 25 ug via INTRAVENOUS

## 2018-12-17 SURGICAL SUPPLY — 65 items
BANDAGE ACE 4X5 VEL STRL LF (GAUZE/BANDAGES/DRESSINGS) ×2 IMPLANT
BLADE OSC/SAGITTAL MD 5.5X18 (BLADE) IMPLANT
BLADE OSCILLATING/SAGITTAL (BLADE)
BLADE SW THK.38XMED LNG THN (BLADE) IMPLANT
BNDG COHESIVE 4X5 TAN STRL (GAUZE/BANDAGES/DRESSINGS) ×2 IMPLANT
BNDG COHESIVE 6X5 TAN STRL LF (GAUZE/BANDAGES/DRESSINGS) ×2 IMPLANT
BNDG CONFORM 2 STRL LF (GAUZE/BANDAGES/DRESSINGS) ×2 IMPLANT
BNDG CONFORM 3 STRL LF (GAUZE/BANDAGES/DRESSINGS) ×2 IMPLANT
BNDG ELASTIC 4X5.8 VLCR STR LF (GAUZE/BANDAGES/DRESSINGS) ×1 IMPLANT
BNDG ESMARK 4X12 TAN STRL LF (GAUZE/BANDAGES/DRESSINGS) ×2 IMPLANT
BNDG GAUZE 4.5X4.1 6PLY STRL (MISCELLANEOUS) ×2 IMPLANT
CANISTER SUCT 1200ML W/VALVE (MISCELLANEOUS) ×2 IMPLANT
CANISTER SUCT 3000ML PPV (MISCELLANEOUS) ×2 IMPLANT
COVER WAND RF STERILE (DRAPES) ×2 IMPLANT
CUFF TOURN SGL QUICK 12 (TOURNIQUET CUFF) ×1 IMPLANT
CUFF TOURN SGL QUICK 18X4 (TOURNIQUET CUFF) IMPLANT
DRAPE FLUOR MINI C-ARM 54X84 (DRAPES) IMPLANT
DRAPE XRAY CASSETTE 23X24 (DRAPES) IMPLANT
DRESSING ALLEVYN 4X4 (MISCELLANEOUS) IMPLANT
DURAPREP 26ML APPLICATOR (WOUND CARE) ×2 IMPLANT
ELECT REM PT RETURN 9FT ADLT (ELECTROSURGICAL) ×2
ELECTRODE REM PT RTRN 9FT ADLT (ELECTROSURGICAL) ×1 IMPLANT
GAUZE PACKING 1/4 X5 YD (GAUZE/BANDAGES/DRESSINGS) ×2 IMPLANT
GAUZE PACKING IODOFORM 1X5 (MISCELLANEOUS) ×2 IMPLANT
GAUZE SPONGE 4X4 12PLY STRL (GAUZE/BANDAGES/DRESSINGS) ×3 IMPLANT
GAUZE XEROFORM 1X8 LF (GAUZE/BANDAGES/DRESSINGS) ×2 IMPLANT
GLOVE BIO SURGEON STRL SZ7.5 (GLOVE) ×2 IMPLANT
GLOVE INDICATOR 8.0 STRL GRN (GLOVE) ×2 IMPLANT
GOWN STRL REUS W/ TWL LRG LVL3 (GOWN DISPOSABLE) ×2 IMPLANT
GOWN STRL REUS W/TWL LRG LVL3 (GOWN DISPOSABLE) ×2
GOWN STRL REUS W/TWL MED LVL3 (GOWN DISPOSABLE) ×4 IMPLANT
HANDPIECE VERSAJET DEBRIDEMENT (MISCELLANEOUS) ×1 IMPLANT
HEMOSTAT SURGICEL 2X3 (HEMOSTASIS) ×1 IMPLANT
IV NS 1000ML (IV SOLUTION) ×1
IV NS 1000ML BAXH (IV SOLUTION) ×1 IMPLANT
KIT TURNOVER KIT A (KITS) ×2 IMPLANT
LABEL OR SOLS (LABEL) ×2 IMPLANT
NDL FILTER BLUNT 18X1 1/2 (NEEDLE) ×1 IMPLANT
NDL HYPO 25X1 1.5 SAFETY (NEEDLE) ×1 IMPLANT
NEEDLE FILTER BLUNT 18X 1/2SAF (NEEDLE) ×1
NEEDLE FILTER BLUNT 18X1 1/2 (NEEDLE) ×1 IMPLANT
NEEDLE HYPO 25X1 1.5 SAFETY (NEEDLE) ×2 IMPLANT
NS IRRIG 500ML POUR BTL (IV SOLUTION) ×2 IMPLANT
PACK EXTREMITY ARMC (MISCELLANEOUS) ×2 IMPLANT
PAD ABD DERMACEA PRESS 5X9 (GAUZE/BANDAGES/DRESSINGS) ×2 IMPLANT
PULSAVAC PLUS IRRIG FAN TIP (DISPOSABLE) ×2
RASP SM TEAR CROSS CUT (RASP) IMPLANT
SHIELD FULL FACE ANTIFOG 7M (MISCELLANEOUS) ×2 IMPLANT
SOL .9 NS 3000ML IRR  AL (IV SOLUTION) ×1
SOL .9 NS 3000ML IRR UROMATIC (IV SOLUTION) ×1 IMPLANT
SOL PREP PVP 2OZ (MISCELLANEOUS) ×2
SOLUTION PREP PVP 2OZ (MISCELLANEOUS) ×1 IMPLANT
STOCKINETTE IMPERVIOUS 9X36 MD (GAUZE/BANDAGES/DRESSINGS) ×2 IMPLANT
STOCKINETTE STRL 6IN 960660 (GAUZE/BANDAGES/DRESSINGS) ×1 IMPLANT
SUT ETHILON 2 0 FS 18 (SUTURE) ×4 IMPLANT
SUT ETHILON 4-0 (SUTURE) ×1
SUT ETHILON 4-0 FS2 18XMFL BLK (SUTURE) ×1
SUT VIC AB 3-0 SH 27 (SUTURE) ×1
SUT VIC AB 3-0 SH 27X BRD (SUTURE) ×1 IMPLANT
SUT VIC AB 4-0 FS2 27 (SUTURE) ×2 IMPLANT
SUTURE ETHLN 4-0 FS2 18XMF BLK (SUTURE) ×1 IMPLANT
SWAB CULTURE AMIES ANAERIB BLU (MISCELLANEOUS) IMPLANT
SYR 10ML LL (SYRINGE) ×4 IMPLANT
SYR 3ML LL SCALE MARK (SYRINGE) ×2 IMPLANT
TIP FAN IRRIG PULSAVAC PLUS (DISPOSABLE) ×1 IMPLANT

## 2018-12-17 NOTE — Anesthesia Preprocedure Evaluation (Signed)
Anesthesia Evaluation  Patient identified by MRN, date of birth, ID band Patient awake    Reviewed: Allergy & Precautions, NPO status , Patient's Chart, lab work & pertinent test results  History of Anesthesia Complications Negative for: history of anesthetic complications  Airway Mallampati: II  TM Distance: >3 FB Neck ROM: Full    Dental  (+) Poor Dentition, Missing   Pulmonary neg sleep apnea, neg COPD, Current Smoker,    breath sounds clear to auscultation- rhonchi (-) wheezing      Cardiovascular hypertension, + Peripheral Vascular Disease  (-) CAD, (-) Past MI, (-) Cardiac Stents and (-) CABG  Rhythm:Regular Rate:Normal - Systolic murmurs and - Diastolic murmurs    Neuro/Psych PSYCHIATRIC DISORDERS Depression CVA    GI/Hepatic negative GI ROS, Neg liver ROS,   Endo/Other  diabetes, Insulin Dependent  Renal/GU ESRF and DialysisRenal disease     Musculoskeletal negative musculoskeletal ROS (+)   Abdominal (+) - obese,   Peds  Hematology negative hematology ROS (+)   Anesthesia Other Findings Past Medical History: No date: Chronic kidney disease No date: Depression No date: Diabetes mellitus without complication (HCC) No date: Glaucoma No date: Glaucoma No date: Gout No date: Heart murmur No date: HLD (hyperlipidemia) No date: Hypertension No date: Prostate cancer (HCC) No date: Renal failure     Comment:  dialysis t/t/s No date: Stroke ([place])     Comment:  tia x 2   Reproductive/Obstetrics                             Anesthesia Physical Anesthesia Plan  ASA: IV  Anesthesia Plan: General   Post-op Pain Management:    Induction: Intravenous  PONV Risk Score and Plan: 0 and Ondansetron  Airway Management Planned: LMA  Additional Equipment:   Intra-op Plan:   Post-operative Plan:   Informed Consent: I have reviewed the patients History and Physical, chart, labs  and discussed the procedure including the risks, benefits and alternatives for the proposed anesthesia with the patient or authorized representative who has indicated his/her understanding and acceptance.     Dental advisory given  Plan Discussed with: CRNA and Anesthesiologist  Anesthesia Plan Comments:         Anesthesia Quick Evaluation

## 2018-12-17 NOTE — Transfer of Care (Signed)
Immediate Anesthesia Transfer of Care Note  Patient: Hitoshi Werts Kerin  Procedure(s) Performed: IRRIGATION AND DEBRIDEMENT FOOT (Right Foot)  Patient Location: PACU  Anesthesia Type:General  Level of Consciousness: awake and alert   Airway & Oxygen Therapy: Patient Spontanous Breathing and Patient connected to face mask oxygen  Post-op Assessment: Report given to RN and Post -op Vital signs reviewed and stable  Post vital signs: Reviewed and stable  Last Vitals:  Vitals Value Taken Time  BP    Temp 36.5 C 12/17/18 1115  Pulse 61 12/17/18 1115  Resp    SpO2      Last Pain:  Vitals:   12/17/18 0951  TempSrc: Tympanic  PainSc: 10-Worst pain ever         Complications: No apparent anesthesia complications

## 2018-12-17 NOTE — Anesthesia Postprocedure Evaluation (Signed)
Anesthesia Post Note  Patient: Jose [name]  Procedure(s) Performed: IRRIGATION AND DEBRIDEMENT FOOT (Right Foot)  Patient location during evaluation: PACU Anesthesia Type: General Level of consciousness: awake and alert and oriented Pain management: pain level controlled Vital Signs Assessment: post-procedure vital signs reviewed and stable Respiratory status: spontaneous breathing, nonlabored ventilation and respiratory function stable Cardiovascular status: blood pressure returned to baseline and stable Postop Assessment: no signs of nausea or vomiting Anesthetic complications: no     Last Vitals:  Vitals:   12/17/18 1301 12/17/18 1409  BP: (!) 143/76 (!) 143/74  Pulse: (!) 56 (!) 59  Resp: 15   Temp:  36.5 C  SpO2: 96% 95%    Last Pain:  Vitals:   12/17/18 1409  TempSrc: Oral  PainSc:                  Merikay Lesniewski

## 2018-12-17 NOTE — Op Note (Signed)
Operative note   Surgeon:Carlos Quackenbush Lawyer: None    Preop diagnosis: Nonhealing surgical wound right foot with superficial gangrenous changes    Postop diagnosis: Same    Procedure: 1 excision distal first metatarsal head 2 delayed primary closure right foot    EBL: Minimal    Anesthesia:local and general.  Local consisted of a one-to-one mixture of 0.25% bupivacaine and Exparel long-acting anesthetic.  Initially 10 cc of the mixture was used.  An additional 10 cc of Exparel and 8 cc of 0.25% bupivacaine was used intraoperatively.    Hemostasis: None    Specimen: Bone for pathology and deep wound culture was performed    Complications: None    Operative indications:Jose [name] is an 73 y.o. that presents today for surgical intervention.  The risks/benefits/alternatives/complications have been discussed and consent has been given.    Procedure:  Patient was brought into the OR and placed on the operating table in thesupine position. After anesthesia was obtained theright lower extremity was prepped and draped in usual sterile fashion.  Attention was directed to the distal aspect of the right foot where the noted open wound and dehiscence was found.  Foul odor was noted with necrotic and infected soft tissue.  At this time excisional debridement was performed with a 15 blade down to the level of bone.  The wound itself measured approximately 4 cm.  At this time thick scar tissue was noted with an inability to perform a primary closure of the wound.  Excision of the distal aspect of the metatarsal was performed.  The bone itself looked to be intact without any signs of gross infection.  This was sent for pathological examination.  Healthy bleeding bone was then noted at this time.  All areas were then debrided with the versa jet to remove any debris and nonviable tissue.  Healthy bleeding was noted.  Bovie cauterization was performed.  At this time primary closure was then  accomplished with a 3-0 nylon suture.  There was taken to allow good closure of the wound with no tension to the wound at all.  The wound had been packed with a small Surgicel packing.  A bulky sterile dressing was applied to the right foot.  Patient will be placed back on the floor.  We will have no weightbearing to the right foot.    Patient tolerated the procedure and anesthesia well.  Was transported from the OR to the PACU with all vital signs stable and vascular status intact. To be discharged per routine protocol.  Will follow up in approximately 1 week in the outpatient clinic.

## 2018-12-17 NOTE — Progress Notes (Signed)
[facility] NAME: Jose [name]    MR#:  [id number]  DATE OF BIRTH:  Nov 01, 1945  SUBJECTIVE:   Patient presented to the hospital due to gangrene of the right foot. S/p debridement of the right foot wound today. Seen in the PACU.  No other complaints.   REVIEW OF SYSTEMS:    Review of Systems  Constitutional: Negative for chills and fever.  HENT: Negative for congestion and tinnitus.   Eyes: Negative for blurred vision and double vision.  Respiratory: Negative for cough, shortness of breath and wheezing.   Cardiovascular: Negative for chest pain, orthopnea and PND.  Gastrointestinal: Negative for abdominal pain, diarrhea, nausea and vomiting.  Genitourinary: Negative for dysuria and hematuria.  Musculoskeletal: Positive for joint pain (Right foot pain).  Neurological: Negative for dizziness, sensory change and focal weakness.  All other systems reviewed and are negative.   Nutrition: Renal/Carb control diet.  Tolerating Diet: Yes Tolerating PT: Await Eval.   DRUG ALLERGIES:   Allergies  Allergen Reactions  . Penicillin G Other (See Comments)    Other reaction(s): Unknown DID THE REACTION INVOLVE: Swelling of the face/tongue/throat, SOB, or low BP? Unknown Sudden or severe rash/hives, skin peeling, or the inside of the mouth or nose? Unknown Did it require medical treatment? Unknown When did it last happen?unknown If all above answers are "NO", may proceed with cephalosporin use.     VITALS:  Blood pressure (!) 143/74, pulse (!) 59, temperature 97.7 F (36.5 C), temperature source Oral, resp. rate 15, height 6\' 1"  (1.854 m), weight 67.8 kg, SpO2 95 %.  PHYSICAL EXAMINATION:   Physical Exam  GENERAL:  73 y.o.-year-old patient lying in bed in no acute distress.  EYES: Pupils equal, round, reactive to light and accommodation. No scleral icterus. Extraocular muscles intact.  HEENT: Head atraumatic, normocephalic.  Oropharynx and nasopharynx clear.  NECK:  Supple, no jugular venous distention. No thyroid enlargement, no tenderness.  LUNGS: Normal breath sounds bilaterally, no wheezing, rales, rhonchi. No use of accessory muscles of respiration.  CARDIOVASCULAR: S1, S2 normal. No murmurs, rubs, or gallops.  ABDOMEN: Soft, nontender, nondistended. Bowel sounds present. No organomegaly or mass.  EXTREMITIES: No cyanosis, clubbing or edema b/l.  Right foot dressing in place.    NEUROLOGIC: Cranial nerves II through XII are intact. No focal Motor or sensory deficits b/l. Globally weak.  PSYCHIATRIC: The patient is alert and oriented x 3.  SKIN: No obvious rash, lesion, or ulcer.   Left upper Ext. AV fistula with good bruit/thrill.   LABORATORY PANEL:   CBC Recent Labs  Lab 12/16/18 0510  WBC 5.3  HGB 10.4*  HCT 32.9*  PLT 133*   ------------------------------------------------------------------------------------------------------------------  Chemistries  Recent Labs  Lab 12/15/18 1751 12/16/18 0510  NA 138 137  K 3.6 4.2  CL 103 103  CO2 26 24  GLUCOSE 96 131*  BUN 48* 51*  CREATININE 4.61* 4.93*  CALCIUM 8.9 8.9  AST 23  --   ALT 18  --   ALKPHOS 48  --   BILITOT 0.7  --    ------------------------------------------------------------------------------------------------------------------  Cardiac Enzymes No results for input(s): TROPONINI in the last 168 hours. ------------------------------------------------------------------------------------------------------------------  RADIOLOGY:  No results found.   ASSESSMENT AND PLAN:   73 year old male patient with a known history of diabetes mellitus type 2, glaucoma, gout, hyperlipidemia, hypertension, prostate cancer, ESRD on dialysis Tuesday Thursday Saturday, stroke was referred from podiatry office due to worsening drainage  and pain in the right foot.   1.  Gangrene of the right foot-patient had recent debridement of the  right foot ulcer but returns back due to worsening pain and drainage in the right foot. -Seen by podiatry status post debridement and excision of the distal first metatarsal head. -Continue local wound care. -Continue broad-spectrum IV antibiotics with vancomycin and clindamycin for now and narrow once wound cultures are back which are currently negative.   2.  End-stage renal disease on hemodialysis- nephrology has been consulted. -Continue dialysis on a Tuesday Thursday Saturday schedule.    3.  Essential hypertension-continue Norvasc, hydralazine, clonidine.  4.  History of gout-no acute attack.  Continue allopurinol.  5.  Diabetes type 2 with ESRD on dialysis-continue glipizide, sliding scale insulin. - BS table.   6.  Tobacco abuse-continue nicotine patch.  7.  Diabetic neuropathy-continue gabapentin.  8.  Secondary hyperparathyroidism-continue PhosLo.     All the records are reviewed and case discussed with Care Management/Social Worker. Management plans discussed with the patient, family and they are in agreement.  CODE STATUS: Full code  DVT Prophylaxis: Hep. SQ  TOTAL TIME TAKING CARE OF THIS PATIENT: 30 minutes.   POSSIBLE D/C IN 2-3 DAYS, DEPENDING ON CLINICAL CONDITION.   Henreitta Leber M.D on 12/17/2018 at 2:51 PM  Between 7am to 6pm - Pager - [phone]  After 6pm go to [email or web address] - Proofreader  [facility]  Office  [phone]  CC: Primary care physician; [name], MD

## 2018-12-17 NOTE — Progress Notes (Signed)
Pharmacy Antibiotic Note  Jose [name] is a 73 y.o. male admitted on 12/15/2018 with wound infection.  Pharmacy has been consulted for vancomycin dosing.   Pt with hx of E faecalis and proteus in wound on left foot. Pt s/p amputation of multiple digits and debridement by podiatry.   D#2 Vancomycin  Plan: Vancomycin 1.75 g x1 then 750 mg with last hour of dialysis TuThSa.   Height: 6\' 1"  (185.4 cm) Weight: 149 lb 7.6 oz (67.8 kg) IBW/kg (Calculated) : 79.9  Temp (24hrs), Avg:98.5 F (36.9 C), Min:97.9 F (36.6 C), Max:98.8 F (37.1 C)  Recent Labs  Lab 12/12/18 0817 12/15/18 1751 12/16/18 0510  WBC 7.0 6.9 5.3  CREATININE 4.14* 4.61* 4.93*    Estimated Creatinine Clearance: 13 mL/min (A) (by C-G formula based on SCr of 4.93 mg/dL (H)).    Allergies  Allergen Reactions  . Penicillin G Other (See Comments)    Other reaction(s): Unknown DID THE REACTION INVOLVE: Swelling of the face/tongue/throat, SOB, or low BP? Unknown Sudden or severe rash/hives, skin peeling, or the inside of the mouth or nose? Unknown Did it require medical treatment? Unknown When did it last happen?unknown If all above answers are "NO", may proceed with cephalosporin use.     Antimicrobials this admission: Clindamycin 7/15 >>  Vancomycin 7/15 >>   Dose adjustments this admission: N/A  Microbiology results: BCx 7/15: NGTD  MRSA PCR 7/15: Negative COVID19 7/15: Negative  Thank you for allowing pharmacy to be a part of this patient's care.  Rowland Lathe 12/17/2018 9:09 AM

## 2018-12-17 NOTE — Care Management Important Message (Signed)
Important Message  Patient Details  Name: Jose [name] MRN: [id number] Date of Birth: 1946/04/13   Medicare Important Message Given:  Yes     [name] 12/17/2018, 11:46 AM

## 2018-12-17 NOTE — Anesthesia Post-op Follow-up Note (Signed)
Anesthesia QCDR form completed.        

## 2018-12-17 NOTE — Progress Notes (Signed)
Established hemodialysis patient known at [facility] TTS 12:00. Uses [facility] for transportation. Please note that any changes in Covid status or mobility may affect this plan. Please call me directly with any placement related questions.  [name] Dialysis Coordinator [phone]

## 2018-12-17 NOTE — Progress Notes (Signed)
[facility], [place] 12/17/18  Subjective:   LOS: 2 07/16 0701 - 07/17 0700 In: 220.5 [P.O.:120; IV Piggyback:100.5] Out: 2250 [Urine:250] Patient known to our practice from outpatient dialysis He presents for areas of drainage from his right foot Admitted for antibiotics and further debridement which is scheduled for today Tolerated dialysis well yesterday  Objective:  Vital signs in last 24 hours:  Temp:  [97.4 F (36.3 C)-98.8 F (37.1 C)] 97.4 F (36.3 C) (07/17 0951) Pulse Rate:  [48-89] 62 (07/17 0951) Resp:  [13-22] 16 (07/17 0951) BP: (119-143)/(54-73) 127/73 (07/17 0951) SpO2:  [97 %-100 %] 98 % (07/17 0951) Weight:  [67.8 kg] 67.8 kg (07/17 0951)  Weight change: 0 kg Filed Weights   12/16/18 0910 12/16/18 0930 12/17/18 0951  Weight: 67.8 kg 67.8 kg 67.8 kg    Intake/Output:    Intake/Output Summary (Last 24 hours) at 12/17/2018 1042 Last data filed at 12/17/2018 8119 Gross per 24 hour  Intake 220.47 ml  Output 2250 ml  Net -2029.53 ml    Gen:   Alert, cooperative, no distress,  Head:   Normocephalic, without obvious abnormality, atraumatic Eyes/ENT:  conjunctiva/corneas clear,  moist oral mucus membranes Neck:  Supple,  thyroid: not enlarged, no JVD Back:    no CVA tenderness Lungs:   Clear to auscultation bilaterally, respirations unlabored Heart:   Atrial flutter on tele yesterday, no rub or gallop Abdomen:   Soft, non-tender, bowel sounds active  Extremities: no cyanosis or edema Skin:  Skin color, texture, turgor normal  Neurologic: Alert and oriented, able to answer questions appropriately, speech somewhat difficult to understand Left forearm AVF     Basic Metabolic Panel:  Recent Labs  Lab 12/12/18 0817 12/15/18 1751 12/16/18 0510  NA 138 138 137  K 3.9 3.6 4.2  CL 96* 103 103  CO2 30 26 24   GLUCOSE 151* 96 131*  BUN 34* 48* 51*  CREATININE 4.14* 4.61* 4.93*  CALCIUM 8.9 8.9 8.9  PHOS  --   --  4.4      CBC: Recent Labs  Lab 12/12/18 0817 12/15/18 1751 12/16/18 0510  WBC 7.0 6.9 5.3  NEUTROABS 4.3  --   --   HGB 11.9* 11.4* 10.4*  HCT 37.0* 34.4* 32.9*  MCV 101.1* 100.6* 102.5*  PLT 171 165 133*      Lab Results  Component Value Date   HEPBSAG Negative 03/22/2018   HEPBSAB Reactive 03/22/2018   HEPBIGM Negative 03/22/2018      Microbiology:  Recent Results (from the past 240 hour(s))  SARS Coronavirus 2 (CEPHEID - Performed in [place] hospital lab), Hosp Order     Status: None   Collection Time: 12/15/18  4:53 PM   Specimen: Nasopharyngeal Swab  Result Value Ref Range Status   SARS Coronavirus 2 NEGATIVE NEGATIVE Final    Comment: (NOTE) If result is NEGATIVE SARS-CoV-2 target nucleic acids are NOT DETECTED. The SARS-CoV-2 RNA is generally detectable in upper and lower  respiratory specimens during the acute phase of infection. The lowest  concentration of SARS-CoV-2 viral copies this assay can detect is 250  copies / mL. A negative result does not preclude SARS-CoV-2 infection  and should not be used as the sole basis for treatment or other  patient management decisions.  A negative result may occur with  improper specimen collection / handling, submission of specimen other  than nasopharyngeal swab, presence of viral mutation(s) within the  areas targeted by this assay, and  inadequate number of viral copies  (<250 copies / mL). A negative result must be combined with clinical  observations, patient history, and epidemiological information. If result is POSITIVE SARS-CoV-2 target nucleic acids are DETECTED. The SARS-CoV-2 RNA is generally detectable in upper and lower  respiratory specimens dur ing the acute phase of infection.  Positive  results are indicative of active infection with SARS-CoV-2.  Clinical  correlation with patient history and other diagnostic information is  necessary to determine patient infection status.  Positive results do   not rule out bacterial infection or co-infection with other viruses. If result is PRESUMPTIVE POSTIVE SARS-CoV-2 nucleic acids MAY BE PRESENT.   A presumptive positive result was obtained on the submitted specimen  and confirmed on repeat testing.  While 2019 novel coronavirus  (SARS-CoV-2) nucleic acids may be present in the submitted sample  additional confirmatory testing may be necessary for epidemiological  and / or clinical management purposes  to differentiate between  SARS-CoV-2 and other Sarbecovirus currently known to infect humans.  If clinically indicated additional testing with an alternate test  methodology [phone]) is advised. The SARS-CoV-2 RNA is generally  detectable in upper and lower respiratory sp ecimens during the acute  phase of infection. The expected result is Negative. Fact Sheet for Patients:  StrictlyIdeas.no Fact Sheet for Healthcare Providers: BankingDealers.co.za This test is not yet approved or cleared by the [place] FDA and has been authorized for detection and/or diagnosis of SARS-CoV-2 by FDA under an Emergency Use Authorization (EUA).  This EUA will remain in effect (meaning this test can be used) for the duration of the COVID-19 declaration under Section 564(b)(1) of the Act, 21 U.S.C. section 360bbb-3(b)(1), unless the authorization is terminated or revoked sooner. Performed at [facility], [address] [place], [place] [address]   MRSA PCR Screening     Status: None   Collection Time: 12/15/18  4:53 PM   Specimen: Nasal Mucosa; Nasopharyngeal  Result Value Ref Range Status   MRSA by PCR NEGATIVE NEGATIVE Final    Comment:        The GeneXpert MRSA Assay (FDA approved for NASAL specimens only), is one component of a comprehensive MRSA colonization surveillance program. It is not intended to diagnose MRSA infection nor to guide or monitor treatment for MRSA  infections. Performed at [facility], [address] [place], [place] [address]   CULTURE, BLOOD (ROUTINE X 2) w Reflex to ID Panel     Status: None (Preliminary result)   Collection Time: 12/15/18  5:51 PM   Specimen: BLOOD  Result Value Ref Range Status   Specimen Description BLOOD BLOOD RIGHT HAND  Final   Special Requests   Final    BOTTLES DRAWN AEROBIC AND ANAEROBIC Blood Culture adequate volume   Culture   Final    NO GROWTH 2 DAYS Performed at [facility], [address], [place], [place] [address]    Report Status PENDING  Incomplete  CULTURE, BLOOD (ROUTINE X 2) w Reflex to ID Panel     Status: None (Preliminary result)   Collection Time: 12/15/18  5:51 PM   Specimen: BLOOD  Result Value Ref Range Status   Specimen Description BLOOD BLOOD RIGHT WRIST  Final   Special Requests   Final    BOTTLES DRAWN AEROBIC AND ANAEROBIC Blood Culture adequate volume   Culture   Final    NO GROWTH 2 DAYS Performed at [facility], [place], [place]  [address]    Report Status PENDING  Incomplete    Coagulation Studies: Recent Labs    12/16/18 0510  LABPROT 14.3  INR 1.1    Urinalysis: No results for input(s): COLORURINE, LABSPEC, PHURINE, GLUCOSEU, HGBUR, BILIRUBINUR, KETONESUR, PROTEINUR, UROBILINOGEN, NITRITE, LEUKOCYTESUR in the last 72 hours.  Invalid input(s): APPERANCEUR    Imaging: No results found.   Medications:   . [MAR Hold] sodium chloride    . [MAR Hold] clindamycin (CLEOCIN) IV 600 mg (12/17/18 0646)  . [MAR Hold] vancomycin Stopped (12/16/18 1423)   . [MAR Hold] allopurinol  100 mg Oral Daily  . [MAR Hold] amLODipine  10 mg Oral Daily  . [MAR Hold] atorvastatin  10 mg Oral q1800  . [MAR Hold] calcium acetate  1,334 mg Oral TID WC  . [MAR Hold] Chlorhexidine Gluconate Cloth  6 each Topical Q0600  . [MAR Hold] cloNIDine  0.2 mg Oral BID  . [MAR Hold] escitalopram  10 mg Oral Daily  . [MAR Hold] feeding  supplement (NEPRO CARB STEADY)  237 mL Oral BID BM  . [MAR Hold] gabapentin  300 mg Oral QHS  . [MAR Hold] glipiZIDE  5 mg Oral QAC breakfast  . [MAR Hold] heparin injection (subcutaneous)  5,000 Units Subcutaneous Q8H  . [MAR Hold] hydrALAZINE  25 mg Oral Q8H  . [MAR Hold] insulin aspart  0-20 Units Subcutaneous TID WC  . [MAR Hold] insulin aspart  0-5 Units Subcutaneous QHS  . [MAR Hold] multivitamin  1 tablet Oral QHS  . [MAR Hold] nicotine  21 mg Transdermal Daily  . [MAR Hold] pneumococcal 23 valent vaccine  0.5 mL Intramuscular Tomorrow-1000  . [MAR Hold] sodium chloride flush  3 mL Intravenous Q12H   [MAR Hold] sodium chloride, [MAR Hold] acetaminophen **OR** [MAR Hold] acetaminophen, bupivacaine, bupivacaine (PF), bupivacaine liposome, [MAR Hold] HYDROcodone-acetaminophen, [MAR Hold] ondansetron **OR** [MAR Hold] ondansetron (ZOFRAN) IV, [MAR Hold] senna-docusate, [MAR Hold] sodium chloride flush  Assessment/ Plan:  73 y.o. African-American male with diabetes, end-stage renal disease, history of prostate cancer, history of stroke, peripheral vascular disease, history of bilateral transmetatarsal amputation  Active Problems:   Foot ulcer ([place])  [facility] TuThSa-2 TW 65 kg  #.  End-stage renal disease.  Hemodialysis TTS schedule Recent Labs    12/12/18 0817 12/15/18 1751 12/16/18 0510  CREATININE 4.14* 4.61* 4.93*  Next hemodialysis will be scheduled for tomorrow  #. Anemia of CKD  Lab Results  Component Value Date   HGB 10.4 (L) 12/16/2018  Will continue on low-dose Epogen  #. SHPTH    Component Value Date/Time   PTH 226 (H) 03/25/2018 1557   Lab Results  Component Value Date   PHOS 4.4 12/16/2018  Continue low dose PhosLo  #. HTN with CKD Requiring multiple anti hypertensives  #. Diabetes type 2 with CKD Hgb A1c MFr Bld (%)  Date Value  12/15/2018 5.6   # Foot infection -Currently being treated with IV vancomycin.   Surgical debridement  planned by podiatrist.   LOS: Abanda 7/17/202010:42 [place], [place]

## 2018-12-18 ENCOUNTER — Encounter: Payer: Self-pay | Admitting: Podiatry

## 2018-12-18 LAB — CBC
HCT: 31.9 % — ABNORMAL LOW (ref 39.0–52.0)
Hemoglobin: 10.4 g/dL — ABNORMAL LOW (ref 13.0–17.0)
MCH: 32.6 pg (ref 26.0–34.0)
MCHC: 32.6 g/dL (ref 30.0–36.0)
MCV: 100 fL (ref 80.0–100.0)
Platelets: 140 10*3/uL — ABNORMAL LOW (ref 150–400)
RBC: 3.19 MIL/uL — ABNORMAL LOW (ref 4.22–5.81)
RDW: 16.9 % — ABNORMAL HIGH (ref 11.5–15.5)
WBC: 7.2 10*3/uL (ref 4.0–10.5)
nRBC: 0 % (ref 0.0–0.2)

## 2018-12-18 LAB — CREATININE, SERUM
Creatinine, Ser: 4.65 mg/dL — ABNORMAL HIGH (ref 0.61–1.24)
GFR calc Af Amer: 14 mL/min — ABNORMAL LOW (ref >60)
GFR calc non Af Amer: 12 mL/min — ABNORMAL LOW (ref >60)

## 2018-12-18 LAB — GLUCOSE, CAPILLARY
Glucose-Capillary: 91 mg/dL (ref 70–99)
Glucose-Capillary: 111 mg/dL — ABNORMAL HIGH (ref 70–99)
Glucose-Capillary: 137 mg/dL — ABNORMAL HIGH (ref 70–99)

## 2018-12-18 MED ORDER — EPOETIN ALFA 10000 UNIT/ML IJ SOLN
4000.0000 [IU] | INTRAMUSCULAR | Status: DC
  Administered 2018-12-18 – 2018-12-21 (×2): 4000 [IU] via INTRAVENOUS

## 2018-12-18 NOTE — Progress Notes (Signed)
Pre hemodialysis assessment   12/18/18 1550  Neurological  Level of Consciousness Alert  Orientation Level Oriented X4  Respiratory  Respiratory Pattern Regular;Unlabored  Chest Assessment Chest expansion symmetrical  Bilateral Breath Sounds Clear;Diminished  Cardiac  Pulse Regular  Jugular Venous Distention (JVD) No  Cardiac Rhythm Atrial fibrillation  Antiarrhythmic device No  Vascular  R Radial Pulse +2  L Radial Pulse +2  Integumentary  Integumentary (WDL) X  Skin Color Appropriate for ethnicity  Skin Condition Dry  Musculoskeletal  Musculoskeletal (WDL) WDL  Generalized Weakness Yes  Gastrointestinal  Bowel Sounds Assessment Active  GU Assessment  Genitourinary (WDL) WDL  Genitourinary Symptoms Oliguria  Psychosocial  Psychosocial (WDL) WDL

## 2018-12-18 NOTE — Progress Notes (Signed)
Hemodialysis treatment completed at 1929. Net UF removed 1L. Blood rinsed back, needles removed one at a time and manual pressure held to AVF until hemostasis achieved. No active bleeding upon patient departure from acute room. Post hemodialysis report given to J. Doughtery, RN.    12/18/18 1929  Hand-Off documentation  Report given to (Full Name) Jose Laine, RN  Vital Signs  Temp 98.4 F (36.9 C)  Temp Source Oral  Pulse Rate (!) 53  Resp 16  BP (!) 154/67  BP Location Right Arm  BP Method Automatic  Patient Position (if appropriate) Lying  Oxygen Therapy  SpO2 99 %  O2 Device Room Air  Pain Assessment  Pain Scale 0-10  Pain Score 10  Pain Type Surgical pain  Pain Location Foot  Pain Orientation Right  During Hemodialysis Assessment  Dialysis Fluid Bolus Normal Saline  Bolus Amount (mL) 250 mL (rinseback)  Intra-Hemodialysis Comments Tx completed  Post-Hemodialysis Assessment  Rinseback Volume (mL) 250 mL  KECN 1500 V (net 1000)  Dialyzer Clearance Lightly streaked  Duration of HD Treatment -hour(s) 3.5 hour(s)  Hemodialysis Intake (mL) 500 mL  UF Total -Machine (mL) 1500 mL  Net UF (mL) 1000 mL  Tolerated HD Treatment Yes  AVG/AVF Arterial Site Held (minutes) 5 minutes  AVG/AVF Venous Site Held (minutes) 5 minutes  Education / Care Plan  Dialysis Education Provided Yes  Documented Education in Care Plan Yes  Outpatient Plan of Care Reviewed and on Chart Yes  Fistula / Graft Left Forearm Arteriovenous fistula  Placement Date/Time: 06/09/18 1325   Placed prior to admission: No  Orientation: Left  Access Location: Forearm  Access Type: Arteriovenous fistula  Site Condition No complications  Fistula / Graft Assessment Present;Bruit;Thrill  Status Deaccessed  Needle Size 15  Drainage Description None

## 2018-12-18 NOTE — Progress Notes (Signed)
[facility] [place] NAME: Jose [name]    MR#:  [id number]  DATE OF BIRTH:  04-13-1946  SUBJECTIVE:   Patient presented to the hospital due to gangrene of the right foot.  S/p debridement of the right foot wound 7/17.   No other complaints.   REVIEW OF SYSTEMS:    Review of Systems  Constitutional: Negative for chills and fever.  HENT: Negative for congestion and tinnitus.   Eyes: Negative for blurred vision and double vision.  Respiratory: Negative for cough, shortness of breath and wheezing.   Cardiovascular: Negative for chest pain, orthopnea and PND.  Gastrointestinal: Negative for abdominal pain, diarrhea, nausea and vomiting.  Genitourinary: Negative for dysuria and hematuria.  Musculoskeletal: Positive for joint pain (Right foot pain).  Neurological: Negative for dizziness, sensory change and focal weakness.  All other systems reviewed and are negative.   Nutrition: Renal/Carb control diet.  Tolerating Diet: Yes Tolerating PT: Await Eval--hold off till tomorrow   DRUG ALLERGIES:   Allergies  Allergen Reactions  . Penicillin G Other (See Comments)    Other reaction(s): Unknown DID THE REACTION INVOLVE: Swelling of the face/tongue/throat, SOB, or low BP? Unknown Sudden or severe rash/hives, skin peeling, or the inside of the mouth or nose? Unknown Did it require medical treatment? Unknown When did it last happen?unknown If all above answers are "NO", may proceed with cephalosporin use.     VITALS:  Blood pressure 113/78, pulse 93, temperature (!) 97.5 F (36.4 C), resp. rate 17, height 6\' 1"  (1.854 m), weight 67.8 kg, SpO2 99 %.  PHYSICAL EXAMINATION:   Physical Exam  GENERAL:  73 y.o.-year-old patient lying in bed in no acute distress.  EYES: Pupils equal, round, reactive to light and accommodation. No scleral icterus. Extraocular muscles intact.  HEENT: Head atraumatic, normocephalic. Oropharynx and  nasopharynx clear.  NECK:  Supple, no jugular venous distention. No thyroid enlargement, no tenderness.  LUNGS: Normal breath sounds bilaterally, no wheezing, rales, rhonchi. No use of accessory muscles of respiration.  CARDIOVASCULAR: S1, S2 normal. No murmurs, rubs, or gallops.  ABDOMEN: Soft, nontender, nondistended. Bowel sounds present. No organomegaly or mass.  EXTREMITIES: No cyanosis, clubbing or edema b/l.  Right foot dressing in place.    NEUROLOGIC: Cranial nerves II through XII are intact. No focal Motor or sensory deficits b/l. Globally weak.  PSYCHIATRIC: The patient is alert and oriented x 3.  SKIN: No obvious rash, lesion, or ulcer.   Left upper Ext. AV fistula with good bruit/thrill.   LABORATORY PANEL:   CBC Recent Labs  Lab 12/18/18 0429  WBC 7.2  HGB 10.4*  HCT 31.9*  PLT 140*   ------------------------------------------------------------------------------------------------------------------  Chemistries  Recent Labs  Lab 12/15/18 1751 12/16/18 0510 12/18/18 0429  NA 138 137  --   K 3.6 4.2  --   CL 103 103  --   CO2 26 24  --   GLUCOSE 96 131*  --   BUN 48* 51*  --   CREATININE 4.61* 4.93* 4.65*  CALCIUM 8.9 8.9  --   AST 23  --   --   ALT 18  --   --   ALKPHOS 48  --   --   BILITOT 0.7  --   --    ------------------------------------------------------------------------------------------------------------------  Cardiac Enzymes No results for input(s): TROPONINI in the last 168 hours. ------------------------------------------------------------------------------------------------------------------  RADIOLOGY:  No results found.   ASSESSMENT AND PLAN:   73 year old  male patient with a known history of diabetes mellitus type 2, glaucoma, gout, hyperlipidemia, hypertension, prostate cancer, ESRD on dialysis Tuesday Thursday Saturday, stroke was referred from podiatry office due to worsening drainage and pain in the right foot.   1.  Gangrene  of the right foot-patient had recent debridement of the right foot ulcer but returns back due to worsening pain and drainage in the right foot. -Seen by podiatry Dr Vickki Muff status post debridement and excision of the distal first metatarsal head on 12/17/2018 -Continue local wound care. -Continue broad-spectrum IV antibiotics with vancomycin and clindamycin for now and narrow once wound cultures are back which are currently negative.  -Holding PT today per podiatry -patient seen by vascular. Plan to get angiogram on Monday.  2.  End-stage renal disease on hemodialysis- nephrology has been consulted. -Continue dialysis on a Tuesday Thursday Saturday schedule.    3.  Essential hypertension-continue Norvasc, hydralazine, clonidine.  4.  History of gout-no acute attack.  Continue allopurinol.  5.  Diabetes type 2 with ESRD on dialysis-continue glipizide, sliding scale insulin. - BS table.   6.  Tobacco abuse-continue nicotine patch.  7.  Diabetic neuropathy-continue gabapentin.  8.  Secondary hyperparathyroidism-continue PhosLo.  All the records are reviewed and case discussed with Care Management/Social Worker. Management plans discussed with the patient  CODE STATUS: Full code  DVT Prophylaxis: Hep. SQ  TOTAL TIME TAKING CARE OF THIS PATIENT: 25 minutes.   POSSIBLE D/C IN 2-3 DAYS, DEPENDING ON CLINICAL CONDITION.   Fritzi Mandes M.D on 12/18/2018 at 10:49 AM  Between 7am to 6pm - Pager - [phone]  After 6pm go to [email or web address] - Proofreader  [facility]  Office  [phone]  CC: Primary care physician; [name], MD

## 2018-12-18 NOTE — Progress Notes (Signed)
Daily Progress Note   Subjective  - 1 Day Post-Op  F/u right foot debridment  Objective Vitals:   12/17/18 1301 12/17/18 1409 12/18/18 0425 12/18/18 1228  BP: (!) 143/76 (!) 143/74 113/78 134/75  Pulse: (!) 56 (!) 59 93 (!) 58  Resp: 15 20 17 19   Temp:  97.7 F (36.5 C) (!) 97.5 F (36.4 C) 98.1 F (36.7 C)  TempSrc:  Oral  Oral  SpO2: 96% 95% 99% 100%  Weight:      Height:        Physical Exam: Wound is intact.  No dehiscence.    Laboratory CBC    Component Value Date/Time   WBC 7.2 12/18/2018 0429   HGB 10.4 (L) 12/18/2018 0429   HGB 13.6 08/18/2011 1054   HCT 31.9 (L) 12/18/2018 0429   PLT 140 (L) 12/18/2018 0429    BMET    Component Value Date/Time   NA 137 12/16/2018 0510   NA 141 08/18/2011 1054   K 4.2 12/16/2018 0510   K 4.5 08/18/2011 1054   CL 103 12/16/2018 0510   CL 108 (H) 08/18/2011 1054   CO2 24 12/16/2018 0510   CO2 23 08/18/2011 1054   GLUCOSE 131 (H) 12/16/2018 0510   GLUCOSE 119 (H) 08/18/2011 1054   BUN 51 (H) 12/16/2018 0510   BUN 25 (H) 08/18/2011 1054   CREATININE 4.65 (H) 12/18/2018 0429   CREATININE 2.10 (H) 08/18/2011 1054   CALCIUM 8.9 12/16/2018 0510   CALCIUM 9.0 08/18/2011 1054   GFRNONAA 12 (L) 12/18/2018 0429   GFRNONAA 34 (L) 08/18/2011 1054   GFRAA 14 (L) 12/18/2018 0429   GFRAA 41 (L) 08/18/2011 1054    Assessment/Planning: Gangrene s/p debridment   Dressing changed.  Will have nursing change every other day while in house.  Next dressing change due Monday.  Will order PT for NWB right foot.  Pt scheduled for angio next week.  OK to change to po antibiotics.  Awaiting culture.  No osteomyelitis.  Will f/u on Tuesday.  Suspect will be able to d/c Tuesday/Wed if medically stable.  Samara Deist A  12/18/2018, 4:30 PM

## 2018-12-18 NOTE — Progress Notes (Signed)
Pre hemodialysis report received from Chauncy Passy RN. Received patient in acute room via bed.. Awake, alert and verbally responsive. No actute distress noted. + Bruit/thrill noted to LUE AVF. AVF accessed with two 15g needles without difficulty. Hemodialysis treatment initiated at 1556 via AVF. Plan for 3.5  hours hemodialysis with UF of 1L as tolerated.    12/18/18 1556  Hand-Off documentation  Report received from (Full Name) Waldo Laine, RN  Vital Signs  Temp 98.2 F (36.8 C)  Temp Source Oral  Pulse Rate 72  Resp 13  BP (!) 138/91  BP Location Right Arm  BP Method Automatic  Patient Position (if appropriate) Lying  Oxygen Therapy  SpO2 100 %  O2 Device Room Air  Pain Assessment  Pain Scale 0-10  Pain Score 0  Time-Out for Hemodialysis  What Procedure? hemodialysis  Pt Identifiers(min of two) First/Last Name;MRN/Account#  Correct Site? Yes  Correct Side? Yes  Correct Procedure? Yes  Consents Verified? Yes  Rad Studies Available? N/A  Safety Precautions Reviewed? Yes  Engineer, civil (consulting) Number 2  Station Number 1  UF/Alarm Test Passed  Conductivity: Meter 13.6  Conductivity: Machine  13.9  pH 7.4  Reverse Osmosis main  Normal Saline Lot Number [id number]  Dialyzer Lot Number [id number]  Disposable Set Lot Number [id number]  Machine Temperature 98.6 F (37 C)  Musician and Audible Yes  Blood Lines Intact and Secured Yes  Pre Treatment Patient Checks  Vascular access used during treatment Fistula  Hepatitis B Surface Antigen Results Negative  Date Hepatitis B Surface Antigen Drawn 03/22/18  Hepatitis B Surface Antibody  (immune)  Date Hepatitis B Surface Antibody Drawn 03/22/18  Hemodialysis Consent Verified Yes  Hemodialysis Standing Orders Initiated Yes  ECG (Telemetry) Monitor On Yes  Prime Ordered Normal Saline  Length of  DialysisTreatment -hour(s) 3.5 Hour(s)  Dialyzer Elisio 17H NR  Dialysate 3K;2.5 Ca  Dialysate Flow Ordered 600  Blood  Flow Rate Ordered 400 mL/min  Ultrafiltration Goal 1000 Liters  During Hemodialysis Assessment  Blood Flow Rate (mL/min) 400 mL/min  Arterial Pressure (mmHg) -140 mmHg  Venous Pressure (mmHg) 190 mmHg  Transmembrane Pressure (mmHg) 50 mmHg  Ultrafiltration Rate (mL/min) 430 mL/min  Dialysate Flow Rate (mL/min) 600 ml/min  Conductivity: Machine  14.1  HD Safety Checks Performed Yes  Dialysis Fluid Bolus Normal Saline  Bolus Amount (mL) 250 mL (prime)  Intra-Hemodialysis Comments Tx initiated

## 2018-12-18 NOTE — Progress Notes (Signed)
[facility], [place] 12/18/18  Subjective:   LOS: 3 07/17 0701 - 07/18 0700 In: 350 [I.V.:350] Out: 160 [Urine:150; Blood:10] Patient known to our practice from outpatient dialysis He presents for areas of drainage from his right foot Surgical debridement and excision of right foot nonhealing surgical wound.  Gangrenous changes were noted.  Patient reports some pain in the right foot this morning.  Denies any nausea or vomiting.  No shortness of breath.   Objective:  Vital signs in last 24 hours:  Temp:  [97.5 F (36.4 C)-97.9 F (36.6 C)] 97.5 F (36.4 C) (07/18 0425) Pulse Rate:  [50-93] 93 (07/18 0425) Resp:  [11-20] 17 (07/18 0425) BP: (113-143)/(63-80) 113/78 (07/18 0425) SpO2:  [95 %-100 %] 99 % (07/18 0425)  Weight change: 0 kg Filed Weights   12/16/18 0910 12/16/18 0930 12/17/18 0951  Weight: 67.8 kg 67.8 kg 67.8 kg    Intake/Output:    Intake/Output Summary (Last 24 hours) at 12/18/2018 1104 Last data filed at 12/18/2018 0900 Gross per 24 hour  Intake 590 ml  Output 160 ml  Net 430 ml    Gen:   Alert, cooperative, no distress,  Head:   Normocephalic, without obvious abnormality, atraumatic Eyes/ENT:  conjunctiva/corneas clear,  moist oral mucus membranes Neck:  Supple,  thyroid: not enlarged, no JVD Back:    no CVA tenderness Lungs:   Clear to auscultation bilaterally, respirations unlabored Heart:   no rub or gallop Abdomen:   Soft, non-tender, bowel sounds active  Extremities: no cyanosis or edema.  Right foot bandaged Skin:  Skin color, texture, turgor normal  Neurologic: Alert and oriented, able to answer questions appropriately, speech somewhat difficult to understand Left forearm AVF     Basic Metabolic Panel:  Recent Labs  Lab 12/12/18 0817 12/15/18 1751 12/16/18 0510 12/18/18 0429  NA 138 138 137  --   K 3.9 3.6 4.2  --   CL 96* 103 103  --   CO2 30 26 24   --   GLUCOSE 151* 96 131*  --   BUN 34* 48* 51*   --   CREATININE 4.14* 4.61* 4.93* 4.65*  CALCIUM 8.9 8.9 8.9  --   PHOS  --   --  4.4  --      CBC: Recent Labs  Lab 12/12/18 0817 12/15/18 1751 12/16/18 0510 12/18/18 0429  WBC 7.0 6.9 5.3 7.2  NEUTROABS 4.3  --   --   --   HGB 11.9* 11.4* 10.4* 10.4*  HCT 37.0* 34.4* 32.9* 31.9*  MCV 101.1* 100.6* 102.5* 100.0  PLT 171 165 133* 140*      Lab Results  Component Value Date   HEPBSAG Negative 03/22/2018   HEPBSAB Reactive 03/22/2018   HEPBIGM Negative 03/22/2018      Microbiology:  Recent Results (from the past 240 hour(s))  SARS Coronavirus 2 (CEPHEID - Performed in [place] hospital lab), Hosp Order     Status: None   Collection Time: 12/15/18  4:53 PM   Specimen: Nasopharyngeal Swab  Result Value Ref Range Status   SARS Coronavirus 2 NEGATIVE NEGATIVE Final    Comment: (NOTE) If result is NEGATIVE SARS-CoV-2 target nucleic acids are NOT DETECTED. The SARS-CoV-2 RNA is generally detectable in upper and lower  respiratory specimens during the acute phase of infection. The lowest  concentration of SARS-CoV-2 viral copies this assay can detect is 250  copies / mL. A negative result does not preclude SARS-CoV-2 infection  and  should not be used as the sole basis for treatment or other  patient management decisions.  A negative result may occur with  improper specimen collection / handling, submission of specimen other  than nasopharyngeal swab, presence of viral mutation(s) within the  areas targeted by this assay, and inadequate number of viral copies  (<250 copies / mL). A negative result must be combined with clinical  observations, patient history, and epidemiological information. If result is POSITIVE SARS-CoV-2 target nucleic acids are DETECTED. The SARS-CoV-2 RNA is generally detectable in upper and lower  respiratory specimens dur ing the acute phase of infection.  Positive  results are indicative of active infection with SARS-CoV-2.  Clinical   correlation with patient history and other diagnostic information is  necessary to determine patient infection status.  Positive results do  not rule out bacterial infection or co-infection with other viruses. If result is PRESUMPTIVE POSTIVE SARS-CoV-2 nucleic acids MAY BE PRESENT.   A presumptive positive result was obtained on the submitted specimen  and confirmed on repeat testing.  While 2019 novel coronavirus  (SARS-CoV-2) nucleic acids may be present in the submitted sample  additional confirmatory testing may be necessary for epidemiological  and / or clinical management purposes  to differentiate between  SARS-CoV-2 and other Sarbecovirus currently known to infect humans.  If clinically indicated additional testing with an alternate test  methodology [phone]) is advised. The SARS-CoV-2 RNA is generally  detectable in upper and lower respiratory sp ecimens during the acute  phase of infection. The expected result is Negative. Fact Sheet for Patients:  StrictlyIdeas.no Fact Sheet for Healthcare Providers: BankingDealers.co.za This test is not yet approved or cleared by the [place] FDA and has been authorized for detection and/or diagnosis of SARS-CoV-2 by FDA under an Emergency Use Authorization (EUA).  This EUA will remain in effect (meaning this test can be used) for the duration of the COVID-19 declaration under Section 564(b)(1) of the Act, 21 U.S.C. section 360bbb-3(b)(1), unless the authorization is terminated or revoked sooner. Performed at [facility], [address] [place], [place] [address]   MRSA PCR Screening     Status: None   Collection Time: 12/15/18  4:53 PM   Specimen: Nasal Mucosa; Nasopharyngeal  Result Value Ref Range Status   MRSA by PCR NEGATIVE NEGATIVE Final    Comment:        The GeneXpert MRSA Assay (FDA approved for NASAL specimens only), is one component of a comprehensive  MRSA colonization surveillance program. It is not intended to diagnose MRSA infection nor to guide or monitor treatment for MRSA infections. Performed at [facility], [address] [place], [place] [address]   CULTURE, BLOOD (ROUTINE X 2) w Reflex to ID Panel     Status: None (Preliminary result)   Collection Time: 12/15/18  5:51 PM   Specimen: BLOOD  Result Value Ref Range Status   Specimen Description BLOOD BLOOD RIGHT HAND  Final   Special Requests   Final    BOTTLES DRAWN AEROBIC AND ANAEROBIC Blood Culture adequate volume   Culture   Final    NO GROWTH 3 DAYS Performed at [facility], [address] [place], [place] [address]    Report Status PENDING  Incomplete  CULTURE, BLOOD (ROUTINE X 2) w Reflex to ID Panel     Status: None (Preliminary result)   Collection Time: 12/15/18  5:51 PM   Specimen: BLOOD  Result Value Ref Range Status   Specimen  Description BLOOD BLOOD RIGHT WRIST  Final   Special Requests   Final    BOTTLES DRAWN AEROBIC AND ANAEROBIC Blood Culture adequate volume   Culture   Final    NO GROWTH 3 DAYS Performed at [facility], [address] [place], [place] [address]    Report Status PENDING  Incomplete  Aerobic/Anaerobic Culture (surgical/deep wound)     Status: None (Preliminary result)   Collection Time: 12/17/18 10:36 AM   Specimen: [facility] Other; Tissue  Result Value Ref Range Status   Specimen Description   Final    WOUND RIGHT FOOT Performed at [facility], [place] [address] [place], [place] [address]    Special Requests   Final    NONE Performed at [facility], [address] [place], [place] [address]    Gram Stain   Final    RARE WBC PRESENT, PREDOMINANTLY PMN FEW GRAM POSITIVE COCCI IN PAIRS RARE GRAM NEGATIVE RODS    Culture   Final    FEW GRAM NEGATIVE RODS IDENTIFICATION AND SUSCEPTIBILITIES TO FOLLOW CULTURE REINCUBATED FOR BETTER GROWTH Performed at [facility],  [place] [address], [place], [place] [address]    Report Status PENDING  Incomplete    Coagulation Studies: Recent Labs    12/16/18 0510  LABPROT 14.3  INR 1.1    Urinalysis: No results for input(s): COLORURINE, LABSPEC, PHURINE, GLUCOSEU, HGBUR, BILIRUBINUR, KETONESUR, PROTEINUR, UROBILINOGEN, NITRITE, LEUKOCYTESUR in the last 72 hours.  Invalid input(s): APPERANCEUR    Imaging: No results found.   Medications:   . sodium chloride 250 mL (12/17/18 1525)  . clindamycin (CLEOCIN) IV 600 mg (12/18/18 0540)  . vancomycin Stopped (12/16/18 1423)   . allopurinol  100 mg Oral Daily  . amLODipine  10 mg Oral Daily  . atorvastatin  10 mg Oral q1800  . calcium acetate  1,334 mg Oral TID WC  . Chlorhexidine Gluconate Cloth  6 each Topical Q0600  . cloNIDine  0.2 mg Oral BID  . escitalopram  10 mg Oral Daily  . feeding supplement (NEPRO CARB STEADY)  237 mL Oral BID BM  . gabapentin  300 mg Oral QHS  . glipiZIDE  5 mg Oral QAC breakfast  . heparin injection (subcutaneous)  5,000 Units Subcutaneous Q8H  . hydrALAZINE  25 mg Oral Q8H  . multivitamin  1 tablet Oral QHS  . nicotine  21 mg Transdermal Daily  . pneumococcal 23 valent vaccine  0.5 mL Intramuscular Tomorrow-1000  . sodium chloride flush  3 mL Intravenous Q12H   sodium chloride, acetaminophen **OR** acetaminophen, HYDROcodone-acetaminophen, morphine injection, ondansetron **OR** ondansetron (ZOFRAN) IV, senna-docusate, sodium chloride flush  Assessment/ Plan:  73 y.o. African-American male with diabetes, end-stage renal disease, history of prostate cancer, history of stroke, peripheral vascular disease, history of bilateral transmetatarsal amputation  Active Problems:   Foot ulcer ([place])   [facility]  TuThSa-2 TW 65 kg  #.  End-stage renal disease.  Hemodialysis TTS schedule Recent Labs    12/12/18 0817 12/15/18 1751 12/16/18 0510 12/18/18 0429  CREATININE 4.14* 4.61* 4.93* 4.65*  Next hemodialysis  will be scheduled for later today  #. Anemia of CKD  Lab Results  Component Value Date   HGB 10.4 (L) 12/18/2018  Will continue on low-dose Epogen  #. SHPTH    Component Value Date/Time   PTH 226 (H) 03/25/2018 1557   Lab Results  Component Value Date   PHOS 4.4 12/16/2018  Continue low dose PhosLo  #.  HTN with CKD Requiring multiple anti hypertensives  #. Diabetes type 2 with CKD Hgb A1c MFr Bld (%)  Date Value  12/15/2018 5.6   # Right Foot infection -Currently being treated with IV vancomycin and clindamycin.  Pharmacist to dose Surgical debridement done 12/17/18.   LOS: [place] 7/18/202011:04 AM  [place], [place]

## 2018-12-19 LAB — GLUCOSE, CAPILLARY
Glucose-Capillary: 123 mg/dL — ABNORMAL HIGH (ref 70–99)
Glucose-Capillary: 61 mg/dL — ABNORMAL LOW (ref 70–99)
Glucose-Capillary: 62 mg/dL — ABNORMAL LOW (ref 70–99)
Glucose-Capillary: 104 mg/dL — ABNORMAL HIGH (ref 70–99)
Glucose-Capillary: 108 mg/dL — ABNORMAL HIGH (ref 70–99)
Glucose-Capillary: 80 mg/dL (ref 70–99)

## 2018-12-19 MED ORDER — SODIUM CHLORIDE 0.9 % IV SOLN
1.0000 g | INTRAVENOUS | Status: DC
  Administered 2018-12-19 – 2018-12-20 (×2): 1 g via INTRAVENOUS
  Filled 2018-12-19 (×2): qty 1

## 2018-12-19 MED ORDER — CLINDAMYCIN PHOSPHATE 300 MG/50ML IV SOLN
300.0000 mg | Freq: Once | INTRAVENOUS | Status: AC
  Administered 2018-12-20: 300 mg via INTRAVENOUS
  Filled 2018-12-19: qty 50

## 2018-12-19 NOTE — Evaluation (Signed)
Physical Therapy Evaluation Patient Details Name: Jose [name] CODE MRN: [id number] DOB: 10/31/1945 Today's Date: 12/19/2018   History of Present Illness  73 yo male who came from podiatrist was having R foot drainage.  He had recent B feet toe amputations May 2020, now with I and D of R foot at surgical site.  PMHx:  CKD with HD, depression, DM, glaucoma, gout, HLD, HTN, CVA, recent loss of wife, heart murmur, stroke  Clinical Impression  Pt was seen for mobility and strengthening with notable pain on R foot limiting his tolerance for transfers.  He is able to scoot with mod assist, and with repositioning on the bed was more comfortable.  Will progress him to chair next visit as pt will permit, and work toward standing with RW as he is able.      Follow Up Recommendations SNF    Equipment Recommendations  None recommended by PT    Recommendations for Other Services       Precautions / Restrictions Precautions Precautions: Fall Precaution Comments: painful R foot Required Braces or Orthoses: Other Brace Other Brace: B ortho shoes Restrictions Weight Bearing Restrictions: Yes RLE Weight Bearing: Non weight bearing      Mobility  Bed Mobility Overal bed mobility: Needs Assistance Bed Mobility: Supine to Sit;Sit to Supine     Supine to sit: Mod assist Sit to supine: Mod assist   General bed mobility comments: mod assist due to pain and weakness  Transfers Overall transfer level: Needs assistance Equipment used: Rolling walker (2 wheeled);1 person hand held assist Transfers: Sit to/from Stand;Lateral/Scoot Transfers Sit to Stand: Total assist        Lateral/Scoot Transfers: Mod assist;From elevated surface General transfer comment: pt could not extend enough effort on LLE to stand with RW and so worked on lateral scoot side of bed, fearful of sliding to chair  Ambulation/Gait             General Gait Details: unable  Research officer, trade [employer]    Modified Rankin (Stroke Patients Only)       Balance Overall balance assessment: Needs assistance Sitting-balance support: Bilateral upper extremity supported;Single extremity supported Sitting balance-Leahy Scale: Good                                       Pertinent Vitals/Pain Pain Assessment: Faces Faces Pain Scale: Hurts whole lot Pain Location: R foot when over side of bed Pain Descriptors / Indicators: Operative site guarding;Grimacing Pain Intervention(s): Limited activity within patient's tolerance;Monitored during session;Repositioned;Patient requesting pain meds-RN notified    Home Living Family/patient expects to be discharged to:: Private residence Living Arrangements: Other relatives Available Help at Discharge: Family;Friend(s);Available PRN/intermittently Type of Home: House Home Access: Stairs to enter Entrance Stairs-Rails: Can reach both Entrance Stairs-Number of Steps: 2 Home Layout: One level Home Equipment: Walker - 2 wheels;Cane - single point;Wheelchair - manual Additional Comments: equipment in good repair    Prior Function Level of Independence: Needs assistance   Gait / Transfers Assistance Needed: has RW and WC for mobility in the house  ADL's / Homemaking Assistance Needed: nephew helps with bathing, dressing, toiletting; able to self feed.   Comments: WIfe passed away this year     Hand Dominance   Dominant Hand: Right    Extremity/Trunk Assessment   Upper Extremity Assessment Upper Extremity  Assessment: Overall WFL for tasks assessed    Lower Extremity Assessment Lower Extremity Assessment: RLE deficits/detail;LLE deficits/detail RLE Deficits / Details: NWB from surgery RLE Coordination: decreased gross motor LLE Deficits / Details: WB permitted but in some pain LLE: Unable to fully assess due to pain LLE Coordination: decreased fine motor;decreased gross motor    Cervical / Trunk  Assessment Cervical / Trunk Assessment: Kyphotic  Communication   Communication: No difficulties  Cognition Arousal/Alertness: Awake/alert Behavior During Therapy: Anxious Overall Cognitive Status: Within Functional Limits for tasks assessed                                 General Comments: pt is following instructions but anxious about trying to transfer to chair      General Comments General comments (skin integrity, edema, etc.): pt is wearing ortho shoes on side of bed with some significant pain on R foot with dependent posture    Exercises     Assessment/Plan    PT Assessment Patient needs continued PT services  PT Problem List Decreased strength;Decreased range of motion;Decreased activity tolerance;Decreased balance;Decreased mobility;Decreased skin integrity;Pain       PT Treatment Interventions DME instruction;Gait training;Stair training;Functional mobility training;Therapeutic activities;Therapeutic exercise;Balance training;Neuromuscular re-education;Patient/family education    PT Goals (Current goals can be found in the Care Plan section)  Acute Rehab PT Goals Patient Stated Goal: to get R foot pain managed PT Goal Formulation: With patient Time For Goal Achievement: 01/02/19 Potential to Achieve Goals: Good    Frequency 7X/week   Barriers to discharge Inaccessible home environment;Decreased caregiver support      Co-evaluation               AM-PAC PT "6 Clicks" Mobility  Outcome Measure Help needed turning from your back to your side while in a flat bed without using bedrails?: A Little Help needed moving from lying on your back to sitting on the side of a flat bed without using bedrails?: A Lot Help needed moving to and from a bed to a chair (including a wheelchair)?: A Lot Help needed standing up from a chair using your arms (e.g., wheelchair or bedside chair)?: Total Help needed to walk in hospital room?: Total Help needed climbing  3-5 steps with a railing? : Total 6 Click Score: 10    End of Session Equipment Utilized During Treatment: Gait belt Activity Tolerance: Patient tolerated treatment well;Patient limited by pain Patient left: in bed;with call bell/phone within reach;with bed alarm set Nurse Communication: Mobility status PT Visit Diagnosis: Muscle weakness (generalized) (M62.81);Pain;Other abnormalities of gait and mobility (R26.89) Pain - Right/Left: Right Pain - part of body: Ankle and joints of foot    Time: 1137-1208 PT Time Calculation (min) (ACUTE ONLY): 31 min   Charges:   PT Evaluation $PT Eval Moderate Complexity: 1 Mod PT Treatments $Therapeutic Activity: 8-22 mins       Ramond Dial 12/19/2018, 12:58 PM   Mee Hives, PT MS Acute Rehab Dept. Number: [facility]

## 2018-12-19 NOTE — Progress Notes (Signed)
[facility] NAME: Jose [name]    MR#:  [id number]  DATE OF BIRTH:  1945-11-22  SUBJECTIVE:   Patient presented to the hospital due to gangrene of the right foot.  S/p debridement of the right foot wound 7/17.   No other complaints.   REVIEW OF SYSTEMS:    Review of Systems  Constitutional: Negative for chills and fever.  HENT: Negative for congestion and tinnitus.   Eyes: Negative for blurred vision and double vision.  Respiratory: Negative for cough, shortness of breath and wheezing.   Cardiovascular: Negative for chest pain, orthopnea and PND.  Gastrointestinal: Negative for abdominal pain, diarrhea, nausea and vomiting.  Genitourinary: Negative for dysuria and hematuria.  Musculoskeletal: Positive for joint pain (Right foot pain).  Neurological: Negative for dizziness, sensory change and focal weakness.  All other systems reviewed and are negative.   Nutrition: Renal/Carb control diet.  Tolerating Diet: Yes Tolerating PT: Await Eval today DRUG ALLERGIES:   Allergies  Allergen Reactions  . Penicillin G Other (See Comments)    Other reaction(s): Unknown DID THE REACTION INVOLVE: Swelling of the face/tongue/throat, SOB, or low BP? Unknown Sudden or severe rash/hives, skin peeling, or the inside of the mouth or nose? Unknown Did it require medical treatment? Unknown When did it last happen?unknown If all above answers are "NO", may proceed with cephalosporin use.     VITALS:  Blood pressure (!) 175/82, pulse 82, temperature 98.4 F (36.9 C), temperature source Oral, resp. rate 20, height 6\' 1"  (1.854 m), weight 67.8 kg, SpO2 97 %.  PHYSICAL EXAMINATION:   Physical Exam  GENERAL:  73 y.o.-year-old patient lying in bed in no acute distress.  EYES: Pupils equal, round, reactive to light and accommodation. No scleral icterus. Extraocular muscles intact.  HEENT: Head atraumatic, normocephalic. Oropharynx and  nasopharynx clear.  NECK:  Supple, no jugular venous distention. No thyroid enlargement, no tenderness.  LUNGS: Normal breath sounds bilaterally, no wheezing, rales, rhonchi. No use of accessory muscles of respiration.  CARDIOVASCULAR: S1, S2 normal. No murmurs, rubs, or gallops.  ABDOMEN: Soft, nontender, nondistended. Bowel sounds present. No organomegaly or mass.  EXTREMITIES: No cyanosis, clubbing or edema b/l.  Right foot dressing in place.    NEUROLOGIC: Cranial nerves II through XII are intact. No focal Motor or sensory deficits b/l. Globally weak.  PSYCHIATRIC: The patient is alert and oriented x 3.  SKIN: No obvious rash, lesion, or ulcer.   Left upper Ext. AV fistula with good bruit/thrill.   LABORATORY PANEL:   CBC Recent Labs  Lab 12/18/18 0429  WBC 7.2  HGB 10.4*  HCT 31.9*  PLT 140*   ------------------------------------------------------------------------------------------------------------------  Chemistries  Recent Labs  Lab 12/15/18 1751 12/16/18 0510 12/18/18 0429  NA 138 137  --   K 3.6 4.2  --   CL 103 103  --   CO2 26 24  --   GLUCOSE 96 131*  --   BUN 48* 51*  --   CREATININE 4.61* 4.93* 4.65*  CALCIUM 8.9 8.9  --   AST 23  --   --   ALT 18  --   --   ALKPHOS 48  --   --   BILITOT 0.7  --   --    ------------------------------------------------------------------------------------------------------------------  Cardiac Enzymes No results for input(s): TROPONINI in the last 168 hours. ------------------------------------------------------------------------------------------------------------------  RADIOLOGY:  No results found.   ASSESSMENT AND PLAN:   73 year old male  patient with a known history of diabetes mellitus type 2, glaucoma, gout, hyperlipidemia, hypertension, prostate cancer, ESRD on dialysis Tuesday Thursday Saturday, stroke was referred from podiatry office due to worsening drainage and pain in the right foot.   1.  Gangrene  of the right foot-patient had recent debridement of the right foot ulcer but returns back due to worsening pain and drainage in the right foot. -Seen by podiatry Dr Vickki Muff status post debridement and excision of the distal first metatarsal head on 12/17/2018 -Continue local wound care. -change to IV rocephin. WC growing proteus . D/c pharmacy -can start PT today per podiatry -patient seen by vascular. Plan to get angiogram on Monday.--Dr Lucky Cowboy aware  2.  End-stage renal disease on hemodialysis- nephrology has been consulted. -Continue dialysis on a Tuesday Thursday Saturday schedule.    3.  Essential hypertension-continue Norvasc, hydralazine, clonidine.  4.  History of gout-no acute attack.  Continue allopurinol.  5.  Diabetes type 2 with ESRD on dialysis-continue glipizide, sliding scale insulin. - BS table.   6.  Tobacco abuse-continue nicotine patch.  7.  Diabetic neuropathy-continue gabapentin.  8.  Secondary hyperparathyroidism-continue PhosLo.  Management plans discussed with the patient  CODE STATUS: Full code  DVT Prophylaxis: Hep. SQ  TOTAL TIME TAKING CARE OF THIS PATIENT: 25 minutes.   POSSIBLE D/C IN 1-2 DAYS, DEPENDING ON CLINICAL CONDITION.   Fritzi Mandes M.D on 12/19/2018 at 10:46 AM  Between 7am to 6pm - Pager - [phone]  After 6pm go to [email or web address] - Proofreader  [facility]  Office  [phone]  CC: Primary care physician; [name], MD

## 2018-12-19 NOTE — Progress Notes (Signed)
[facility], [place] 12/19/18  Subjective:   LOS: 4 07/18 0701 - 07/19 0700 In: 240 [P.O.:240] Out: 1280 [Urine:280] Patient known to our practice from outpatient dialysis He presents for areas of drainage from his right foot Underwent Surgical debridement and excision of right foot nonhealing surgical wound.  Gangrenous changes were noted.  Denies any nausea or vomiting.  No shortness of breath. Wants less restricted diet   Objective:  Vital signs in last 24 hours:  Temp:  [98.1 F (36.7 C)-98.8 F (37.1 C)] 98.4 F (36.9 C) (07/19 0433) Pulse Rate:  [43-82] 82 (07/19 0433) Resp:  [9-20] 20 (07/19 0433) BP: (95-183)/(56-94) 175/82 (07/19 0433) SpO2:  [94 %-100 %] 97 % (07/19 0433)  Weight change:  Filed Weights   12/16/18 0910 12/16/18 0930 12/17/18 0951  Weight: 67.8 kg 67.8 kg 67.8 kg    Intake/Output:    Intake/Output Summary (Last 24 hours) at 12/19/2018 1141 Last data filed at 12/18/2018 2100 Gross per 24 hour  Intake 0 ml  Output 1200 ml  Net -1200 ml    Gen:   Alert, cooperative, no distress,  Head:   Normocephalic, without obvious abnormality, atraumatic Eyes/ENT:  conjunctiva/corneas clear,  moist oral mucus membranes Neck:  Supple,  thyroid: not enlarged, no JVD Lungs:   Clear to auscultation bilaterally, respirations unlabored Heart:   no rub or gallop Abdomen:   Soft, non-tender, bowel sounds active  Extremities: no cyanosis or edema.  Right foot bandaged Skin:  Skin color, texture, turgor normal  Neurologic: Alert and oriented, able to answer questions appropriately, speech somewhat difficult to understand Left forearm AVF     Basic Metabolic Panel:  Recent Labs  Lab 12/15/18 1751 12/16/18 0510 12/18/18 0429  NA 138 137  --   K 3.6 4.2  --   CL 103 103  --   CO2 26 24  --   GLUCOSE 96 131*  --   BUN 48* 51*  --   CREATININE 4.61* 4.93* 4.65*  CALCIUM 8.9 8.9  --   PHOS  --  4.4  --      CBC: Recent  Labs  Lab 12/15/18 1751 12/16/18 0510 12/18/18 0429  WBC 6.9 5.3 7.2  HGB 11.4* 10.4* 10.4*  HCT 34.4* 32.9* 31.9*  MCV 100.6* 102.5* 100.0  PLT 165 133* 140*      Lab Results  Component Value Date   HEPBSAG Negative 03/22/2018   HEPBSAB Reactive 03/22/2018   HEPBIGM Negative 03/22/2018      Microbiology:  Recent Results (from the past 240 hour(s))  SARS Coronavirus 2 (CEPHEID - Performed in [facility] hospital lab), Hosp Order     Status: None   Collection Time: 12/15/18  4:53 PM   Specimen: Nasopharyngeal Swab  Result Value Ref Range Status   SARS Coronavirus 2 NEGATIVE NEGATIVE Final    Comment: (NOTE) If result is NEGATIVE SARS-CoV-2 target nucleic acids are NOT DETECTED. The SARS-CoV-2 RNA is generally detectable in upper and lower  respiratory specimens during the acute phase of infection. The lowest  concentration of SARS-CoV-2 viral copies this assay can detect is 250  copies / mL. A negative result does not preclude SARS-CoV-2 infection  and should not be used as the sole basis for treatment or other  patient management decisions.  A negative result may occur with  improper specimen collection / handling, submission of specimen other  than nasopharyngeal swab, presence of viral mutation(s) within the  areas targeted by  this assay, and inadequate number of viral copies  (<250 copies / mL). A negative result must be combined with clinical  observations, patient history, and epidemiological information. If result is POSITIVE SARS-CoV-2 target nucleic acids are DETECTED. The SARS-CoV-2 RNA is generally detectable in upper and lower  respiratory specimens dur ing the acute phase of infection.  Positive  results are indicative of active infection with SARS-CoV-2.  Clinical  correlation with patient history and other diagnostic information is  necessary to determine patient infection status.  Positive results do  not rule out bacterial infection or co-infection  with other viruses. If result is PRESUMPTIVE POSTIVE SARS-CoV-2 nucleic acids MAY BE PRESENT.   A presumptive positive result was obtained on the submitted specimen  and confirmed on repeat testing.  While 2019 novel coronavirus  (SARS-CoV-2) nucleic acids may be present in the submitted sample  additional confirmatory testing may be necessary for epidemiological  and / or clinical management purposes  to differentiate between  SARS-CoV-2 and other Sarbecovirus currently known to infect humans.  If clinically indicated additional testing with an alternate test  methodology [phone]) is advised. The SARS-CoV-2 RNA is generally  detectable in upper and lower respiratory sp ecimens during the acute  phase of infection. The expected result is Negative. Fact Sheet for Patients:  StrictlyIdeas.no Fact Sheet for Healthcare Providers: BankingDealers.co.za This test is not yet approved or cleared by the [place] FDA and has been authorized for detection and/or diagnosis of SARS-CoV-2 by FDA under an Emergency Use Authorization (EUA).  This EUA will remain in effect (meaning this test can be used) for the duration of the COVID-19 declaration under Section 564(b)(1) of the Act, 21 U.S.C. section 360bbb-3(b)(1), unless the authorization is terminated or revoked sooner. Performed at [facility], [address] [place], [place] [address]   MRSA PCR Screening     Status: None   Collection Time: 12/15/18  4:53 PM   Specimen: Nasal Mucosa; Nasopharyngeal  Result Value Ref Range Status   MRSA by PCR NEGATIVE NEGATIVE Final    Comment:        The GeneXpert MRSA Assay (FDA approved for NASAL specimens only), is one component of a comprehensive MRSA colonization surveillance program. It is not intended to diagnose MRSA infection nor to guide or monitor treatment for MRSA infections. Performed at [facility], [address] [place], [place] [address]   CULTURE, BLOOD (ROUTINE X 2) w Reflex to ID Panel     Status: None (Preliminary result)   Collection Time: 12/15/18  5:51 PM   Specimen: BLOOD  Result Value Ref Range Status   Specimen Description BLOOD BLOOD RIGHT HAND  Final   Special Requests   Final    BOTTLES DRAWN AEROBIC AND ANAEROBIC Blood Culture adequate volume   Culture   Final    NO GROWTH 4 DAYS Performed at [facility], [address] [place],  [address]    Report Status PENDING  Incomplete  CULTURE, BLOOD (ROUTINE X 2) w Reflex to ID Panel     Status: None (Preliminary result)   Collection Time: 12/15/18  5:51 PM   Specimen: BLOOD  Result Value Ref Range Status   Specimen Description BLOOD BLOOD RIGHT WRIST  Final   Special Requests   Final    BOTTLES DRAWN AEROBIC AND ANAEROBIC Blood Culture adequate volume   Culture   Final    NO GROWTH 4 DAYS Performed at [facility] [address] [place], [place] [address]    Report Status PENDING  Incomplete  Aerobic/Anaerobic Culture (surgical/deep wound)     Status: None (Preliminary result)   Collection Time: 12/17/18 10:36 AM   Specimen: [facility] Other; Tissue  Result Value Ref Range Status   Specimen Description   Final    WOUND RIGHT FOOT Performed at [facility], [place] [address] [place], [place] [address]    Special Requests   Final    NONE Performed at [facility], [address] [place], [place] [address]    Gram Stain   Final    RARE WBC PRESENT, PREDOMINANTLY PMN FEW GRAM POSITIVE COCCI IN PAIRS RARE GRAM NEGATIVE RODS    Culture   Final    FEW PROTEUS MIRABILIS CULTURE REINCUBATED FOR BETTER GROWTH Performed at [facility], [place] [address] [place], [place] [address]    Report Status PENDING  Incomplete   Organism ID, Bacteria PROTEUS MIRABILIS  Final      Susceptibility   Proteus mirabilis - MIC*    AMPICILLIN <=2 SENSITIVE Sensitive     CEFAZOLIN <=4 SENSITIVE  Sensitive     CEFEPIME <=1 SENSITIVE Sensitive     CEFTAZIDIME <=1 SENSITIVE Sensitive     CEFTRIAXONE <=1 SENSITIVE Sensitive     CIPROFLOXACIN <=0.25 SENSITIVE Sensitive     GENTAMICIN <=1 SENSITIVE Sensitive     IMIPENEM 2 SENSITIVE Sensitive     TRIMETH/SULFA <=20 SENSITIVE Sensitive     AMPICILLIN/SULBACTAM <=2 SENSITIVE Sensitive     PIP/TAZO <=4 SENSITIVE Sensitive     * FEW PROTEUS MIRABILIS    Coagulation Studies: No results for input(s): LABPROT, INR in the last 72 hours.  Urinalysis: No results for input(s): COLORURINE, LABSPEC, PHURINE, GLUCOSEU, HGBUR, BILIRUBINUR, KETONESUR, PROTEINUR, UROBILINOGEN, NITRITE, LEUKOCYTESUR in the last 72 hours.  Invalid input(s): APPERANCEUR    Imaging: No results found.   Medications:   . sodium chloride 250 mL (12/18/18 1445)  . cefTRIAXone (ROCEPHIN)  IV     . allopurinol  100 mg Oral Daily  . amLODipine  10 mg Oral Daily  . atorvastatin  10 mg Oral q1800  . calcium acetate  1,334 mg Oral TID WC  . Chlorhexidine Gluconate Cloth  6 each Topical Q0600  . cloNIDine  0.2 mg Oral BID  . epoetin (EPOGEN/PROCRIT) injection  4,000 Units Intravenous Q T,Th,Sa-HD  . escitalopram  10 mg Oral Daily  . feeding supplement (NEPRO CARB STEADY)  237 mL Oral BID BM  . gabapentin  300 mg Oral QHS  . glipiZIDE  5 mg Oral QAC breakfast  . heparin injection (subcutaneous)  5,000 Units Subcutaneous Q8H  . hydrALAZINE  25 mg Oral Q8H  . multivitamin  1 tablet Oral QHS  . nicotine  21 mg Transdermal Daily  . pneumococcal 23 valent vaccine  0.5 mL Intramuscular Tomorrow-1000  . sodium chloride flush  3 mL Intravenous Q12H   sodium chloride, acetaminophen **OR** acetaminophen, HYDROcodone-acetaminophen, morphine injection, ondansetron **OR** ondansetron (ZOFRAN) IV, senna-docusate, sodium chloride flush  Assessment/ Plan:  73 y.o. African-American male with diabetes, end-stage renal disease, history of prostate cancer, history of stroke,  peripheral vascular disease, history of bilateral transmetatarsal amputation  Active Problems:   Foot ulcer ([place])  [facility]  TuThSa-2 TW 65 kg  #.  End-stage renal disease.  Hemodialysis TTS schedule Recent Labs    12/15/18 1751 12/16/18 0510 12/18/18 0429  CREATININE 4.61* 4.93* 4.65*  Next hemodialysis will be scheduled for  tuesday  #. Anemia of CKD  Lab Results  Component Value Date   HGB 10.4 (L) 12/18/2018  Will continue on low-dose Epogen  #. SHPTH    Component Value Date/Time   PTH 226 (H) 03/25/2018 1557   Lab Results  Component Value Date   PHOS 4.4 12/16/2018  Continue low dose PhosLo  #. HTN with CKD Requiring multiple anti hypertensives  #. Diabetes type 2 with CKD Hgb A1c MFr Bld (%)  Date Value  12/15/2018 5.6   # Right Foot infection -Currently being treated with IV vancomycin and clindamycin.  Pharmacist to dose Surgical debridement done 12/17/18. Angiogram is planned for tomorrow   LOS: [place] 7/19/202011:41 AM  [place], [place]

## 2018-12-20 ENCOUNTER — Inpatient Hospital Stay
Admission: AD | Disposition: A | Discharge status: Skilled Nursing Facility | Payer: Self-pay | Source: Ambulatory Visit | Attending: Internal Medicine

## 2018-12-20 DIAGNOSIS — I70261 Atherosclerosis of native arteries of extremities with gangrene, right leg: Secondary | ICD-10-CM

## 2018-12-20 HISTORY — PX: LOWER EXTREMITY ANGIOGRAPHY: CATH118251

## 2018-12-20 LAB — CULTURE, BLOOD (ROUTINE X 2)
Culture: NO GROWTH
Culture: NO GROWTH
Special Requests: ADEQUATE
Special Requests: ADEQUATE

## 2018-12-20 LAB — SURGICAL PATHOLOGY

## 2018-12-20 LAB — GLUCOSE, CAPILLARY
Glucose-Capillary: 72 mg/dL (ref 70–99)
Glucose-Capillary: 86 mg/dL (ref 70–99)
Glucose-Capillary: 75 mg/dL (ref 70–99)
Glucose-Capillary: 79 mg/dL (ref 70–99)
Glucose-Capillary: 76 mg/dL (ref 70–99)
Glucose-Capillary: 134 mg/dL — ABNORMAL HIGH (ref 70–99)
Glucose-Capillary: 134 mg/dL — ABNORMAL HIGH (ref 70–99)

## 2018-12-20 SURGERY — LOWER EXTREMITY ANGIOGRAPHY
Anesthesia: Moderate Sedation | Laterality: Right

## 2018-12-20 MED ORDER — IODIXANOL 320 MG/ML IV SOLN
INTRAVENOUS | Status: DC | PRN
  Administered 2018-12-20: 85 mL via INTRA_ARTERIAL

## 2018-12-20 MED ORDER — HYDROMORPHONE HCL 1 MG/ML IJ SOLN: 1.0000 mg | Freq: Once | INTRAMUSCULAR | Status: DC | PRN

## 2018-12-20 MED ORDER — FENTANYL CITRATE (PF) 100 MCG/2ML IJ SOLN
INTRAMUSCULAR | Status: AC
  Filled 2018-12-20: qty 2

## 2018-12-20 MED ORDER — HEPARIN SODIUM (PORCINE) 1000 UNIT/ML IJ SOLN
INTRAMUSCULAR | Status: AC
  Filled 2018-12-20: qty 1

## 2018-12-20 MED ORDER — FENTANYL CITRATE (PF) 100 MCG/2ML IJ SOLN
INTRAMUSCULAR | Status: DC | PRN
  Administered 2018-12-20: 50 ug via INTRAVENOUS

## 2018-12-20 MED ORDER — ONDANSETRON HCL 4 MG/2ML IJ SOLN: 4.0000 mg | Freq: Four times a day (QID) | INTRAMUSCULAR | Status: DC | PRN

## 2018-12-20 MED ORDER — MIDAZOLAM HCL 2 MG/2ML IJ SOLN
INTRAMUSCULAR | Status: DC | PRN
  Administered 2018-12-20: 2 mg via INTRAVENOUS

## 2018-12-20 MED ORDER — SODIUM CHLORIDE 0.9 % IV SOLN
INTRAVENOUS | Status: DC
  Administered 2018-12-20: 12:00:00 via INTRAVENOUS

## 2018-12-20 MED ORDER — MIDAZOLAM HCL 2 MG/ML PO SYRP: 8.0000 mg | ORAL_SOLUTION | Freq: Once | ORAL | Status: DC | PRN

## 2018-12-20 MED ORDER — DIPHENHYDRAMINE HCL 50 MG/ML IJ SOLN: 50.0000 mg | Freq: Once | INTRAMUSCULAR | Status: DC | PRN

## 2018-12-20 MED ORDER — NITROGLYCERIN 5 MG/ML IV SOLN
INTRAVENOUS | Status: AC
  Filled 2018-12-20: qty 10

## 2018-12-20 MED ORDER — CEFDINIR 300 MG PO CAPS
300.0000 mg | ORAL_CAPSULE | ORAL | Status: DC
  Administered 2018-12-21: 300 mg via ORAL
  Filled 2018-12-20: qty 1

## 2018-12-20 MED ORDER — FAMOTIDINE 20 MG PO TABS: 40.0000 mg | ORAL_TABLET | Freq: Once | ORAL | Status: DC | PRN

## 2018-12-20 MED ORDER — HEPARIN SODIUM (PORCINE) 1000 UNIT/ML IJ SOLN
INTRAMUSCULAR | Status: DC | PRN
  Administered 2018-12-20: 4000 [IU] via INTRAVENOUS

## 2018-12-20 MED ORDER — MIDAZOLAM HCL 5 MG/5ML IJ SOLN
INTRAMUSCULAR | Status: AC
  Filled 2018-12-20: qty 5

## 2018-12-20 MED ORDER — METHYLPREDNISOLONE SODIUM SUCC 125 MG IJ SOLR: 125.0000 mg | Freq: Once | INTRAMUSCULAR | Status: DC | PRN

## 2018-12-20 SURGICAL SUPPLY — 21 items
BALLN LUTONIX 018 4X100X130 (BALLOONS) ×3
BALLN ULTRVRSE 3X300X150 (BALLOONS) ×2
BALLN ULTRVRSE 3X300X150 OTW (BALLOONS) ×1
BALLOON LUTONIX 018 4X100X130 (BALLOONS) IMPLANT
BALLOON ULTRVRSE 3X300X150 OTW (BALLOONS) IMPLANT
CATH BEACON 5 .038 100 VERT TP (CATHETERS) ×2 IMPLANT
CATH CXI SUPP 2.6F 150 ANG (CATHETERS) ×2 IMPLANT
CATH CXI SUPP ANG 4FR 135 (CATHETERS) IMPLANT
CATH CXI SUPP ANG 4FR 135CM (CATHETERS) ×3
CATH PIG 70CM (CATHETERS) ×2 IMPLANT
COVER PROBE U/S 5X48 (MISCELLANEOUS) ×2 IMPLANT
DEVICE PRESTO INFLATION (MISCELLANEOUS) ×2 IMPLANT
DEVICE STARCLOSE SE CLOSURE (Vascular Products) ×2 IMPLANT
GUIDEWIRE PFTE-COATED .018X300 (WIRE) ×2 IMPLANT
PACK ANGIOGRAPHY (CUSTOM PROCEDURE TRAY) ×3 IMPLANT
SHEATH ANL1 5FRX70 (SHEATH) ×2 IMPLANT
SHEATH BRITE TIP 5FRX11 (SHEATH) ×2 IMPLANT
SYR MEDRAD MARK 7 150ML (SYRINGE) ×2 IMPLANT
TUBING CONTRAST HIGH PRESS 72 (TUBING) ×4 IMPLANT
WIRE J 3MM .035X145CM (WIRE) ×2 IMPLANT
WIRE MAGIC TORQUE 260C (WIRE) ×2 IMPLANT

## 2018-12-20 NOTE — Progress Notes (Signed)
[facility] [place] NAME: Jose [name]    MR#:  [id number]  DATE OF BIRTH:  06-24-1945  SUBJECTIVE:   Patient presented to the hospital due to gangrene of the right foot.  S/p debridement of the right foot wound 7/17.    -Patient does have right foot pain.  REVIEW OF SYSTEMS:    Review of Systems  Constitutional: Negative for chills and fever.  HENT: Negative for congestion and tinnitus.   Eyes: Negative for blurred vision and double vision.  Respiratory: Negative for cough, shortness of breath and wheezing.   Cardiovascular: Negative for chest pain, orthopnea and PND.  Gastrointestinal: Negative for abdominal pain, diarrhea, nausea and vomiting.  Genitourinary: Negative for dysuria and hematuria.  Musculoskeletal: Positive for joint pain (Right foot pain).  Neurological: Negative for dizziness, sensory change and focal weakness.  All other systems reviewed and are negative.   Nutrition: Renal/Carb control diet.  Tolerating Diet: Yes Tolerating PT: Await Eval today DRUG ALLERGIES:   Allergies  Allergen Reactions   Penicillin G Other (See Comments)    Other reaction(s): Unknown DID THE REACTION INVOLVE: Swelling of the face/tongue/throat, SOB, or low BP? Unknown Sudden or severe rash/hives, skin peeling, or the inside of the mouth or nose? Unknown Did it require medical treatment? Unknown When did it last happen?unknown If all above answers are "NO", may proceed with cephalosporin use.     VITALS:  Blood pressure 124/72, pulse (!) 56, temperature 97.9 F (36.6 C), temperature source Oral, resp. rate 12, height 6\' 1"  (1.854 m), weight 67.8 kg, SpO2 93 %.  PHYSICAL EXAMINATION:   Physical Exam  GENERAL:  73 y.o.-year-old patient lying in bed in no acute distress.  EYES: Pupils equal, round, reactive to light and accommodation. No scleral icterus. Extraocular muscles intact.  HEENT: Head atraumatic, normocephalic.  Oropharynx and nasopharynx clear.  NECK:  Supple, no jugular venous distention. No thyroid enlargement, no tenderness.  LUNGS: Normal breath sounds bilaterally, no wheezing, rales, rhonchi. No use of accessory muscles of respiration.  CARDIOVASCULAR: S1, S2 normal. No murmurs, rubs, or gallops.  ABDOMEN: Soft, nontender, nondistended. Bowel sounds present. No organomegaly or mass.  EXTREMITIES: No cyanosis, clubbing or edema b/l.  Right foot dressing in place.    NEUROLOGIC: Cranial nerves II through XII are intact. No focal Motor or sensory deficits b/l. Globally weak.  PSYCHIATRIC: The patient is alert and oriented x 3.  SKIN: No obvious rash, lesion, or ulcer.   Left upper Ext. AV fistula with good bruit/thrill.   LABORATORY PANEL:   CBC Recent Labs  Lab 12/18/18 0429  WBC 7.2  HGB 10.4*  HCT 31.9*  PLT 140*   ------------------------------------------------------------------------------------------------------------------  Chemistries  Recent Labs  Lab 12/15/18 1751 12/16/18 0510 12/18/18 0429  NA 138 137  --   K 3.6 4.2  --   CL 103 103  --   CO2 26 24  --   GLUCOSE 96 131*  --   BUN 48* 51*  --   CREATININE 4.61* 4.93* 4.65*  CALCIUM 8.9 8.9  --   AST 23  --   --   ALT 18  --   --   ALKPHOS 48  --   --   BILITOT 0.7  --   --    ------------------------------------------------------------------------------------------------------------------  Cardiac Enzymes No results for input(s): TROPONINI in the last 168 hours. ------------------------------------------------------------------------------------------------------------------  RADIOLOGY:  No results found.   ASSESSMENT AND PLAN:  73 year old male patient with a known history of diabetes mellitus type 2, glaucoma, gout, hyperlipidemia, hypertension, prostate cancer, ESRD on dialysis Tuesday Thursday Saturday, stroke was referred from podiatry office due to worsening drainage and pain in the right foot.     1. Gangrene of the right foot-patient had recent debridement of the right foot ulcer but returns back due to worsening pain and drainage in the right foot. -Seen by podiatry Dr Vickki Muff status post debridement and excision of the distal first metatarsal head on 12/17/2018 -Continue local wound care. -change to IV rocephin--- to oral Cefdinir. WC growing proteus . D/w pharmacy -can start PT today per podiatry-- recommends rehab patient refusing -patient seen by vascular--s/p angiogram right LE wit PTA of right tibioperoneal and proxima peroneal artery.  PTA of right ant tibial artery  2.  End-stage renal disease on hemodialysis- nephrology has been consulted. -Continue dialysis on a Tuesday Thursday Saturday schedule.    3.  Essential hypertension-continue Norvasc, hydralazine, clonidine.  4.  History of gout-no acute attack.  Continue allopurinol.  5.  Diabetes type 2 with ESRD on dialysis-continue glipizide, sliding scale insulin. - BS table.   6.  Tobacco abuse-continue nicotine patch.  7.  Diabetic neuropathy-continue gabapentin.  8.  Secondary hyperparathyroidism-continue PhosLo.  Per care manager patient is declining to go to rehab. Will discharged tomorrow to home with home health and PT Management plans discussed with the patient  CODE STATUS: Full code  DVT Prophylaxis: Hep. SQ  TOTAL TIME TAKING CARE OF THIS PATIENT: 25 minutes.   POSSIBLE D/C IN 1-2 DAYS, DEPENDING ON CLINICAL CONDITION.   Fritzi Mandes M.D on 12/20/2018 at 2:38 PM  Between 7am to 6pm - Pager - [phone]  After 6pm go to [email or web address] - Proofreader  [facility]  Office  [phone]  CC: Primary care physician; [name], MD

## 2018-12-20 NOTE — Op Note (Signed)
[facility]  Percutaneous Study/Intervention Procedural Note   Date of Surgery: 12/20/2018  Surgeon(s):Farran Amsden    Assistants:none  Pre-operative Diagnosis: PAD with gangrene right foot  Post-operative diagnosis:  Same  Procedure(s) Performed:             1.  Ultrasound guidance for vascular access left femoral artery             2.  Catheter placement into right SFA from left femoral approach             3.  Aortogram and selective right lower extremity angiogram             4.  Percutaneous transluminal angioplasty of right tibioperoneal trunk and proximal peroneal artery with 4 mm diameter by 10 cm length Lutonix drug-coated angioplasty balloon             5.   Percutaneous transluminal angioplasty of the entire right anterior tibial artery from the proximal foot up to its origin with 2 inflations with a 3 mm diameter by 30 cm length angioplasty balloon  6.   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 The patient is brought in for angiography for further evaluation and potential treatment.  Due to the limb threatening nature of the situation, angiogram was performed for attempted limb salvage. The patient is aware that if the procedure fails, amputation would be expected.  The patient also understands that even with successful revascularization, amputation may still be required due to the severity of the situation.  Risks and benefits are discussed and informed consent is obtained.   Procedure:  The patient was identified and appropriate procedural time out was  performed.  The patient was then placed supine on the table and prepped and draped in the usual sterile fashion. Moderate conscious sedation was administered during a face to face encounter with the patient throughout the procedure with my supervision of the RN administering medicines and monitoring the patient's vital signs, pulse oximetry, telemetry and mental status throughout from the start of the procedure until the patient was taken to the recovery room. Ultrasound was used to evaluate the left common femoral artery.  It was patent .  A digital ultrasound image was acquired.  A Seldinger needle was used to access the left common femoral artery under direct ultrasound guidance and a permanent image was performed.  A 0.035 J wire was advanced without resistance and a 5Fr sheath was placed.  Pigtail catheter was placed into the aorta and an AP aortogram was performed. This demonstrated that the renal arteries appeared patent all of the right renal artery was not particularly well seen potentially due to catheter position.  The aorta and iliac arteries were patent without significant stenosis including the previously placed stents in the iliac arteries. I then crossed the aortic bifurcation and advanced to the right femoral head.  Due to the extremely slow circulation time, the catheter was advanced into the mid SFA to opacify the tibial vessels.  Selective right lower extremity angiogram was then performed. This  demonstrated mildly irregular common femoral artery, profunda femoris artery, and superficial femoral artery without any stenosis of more than about 30%.  The popliteal artery was also mildly irregular.  The tibioperoneal trunk had about a 55 to 60% stenosis at its origin and then another stenosis in the midsegment in the 50% range.  The posterior tibial artery was large and patent into the foot.  The anterior tibial artery occluded about 5 cm beyond its origin but did appear to have distal reconstitution  in the lower leg.  The peroneal artery was small but continuous and provide a second runoff vessel distally. It was felt that it was in the patient's best interest to proceed with intervention after these images to avoid a second procedure and a larger amount of contrast and fluoroscopy based off of the findings from the initial angiogram. The patient was systemically heparinized and a 5 French 70cm sheath was then placed over the Magic Tourque wire. I then used a Kumpe catheter and the 0.018 advantage wire to navigate through the stenosis in the tibioperoneal trunk and get a wire down into the peroneal artery.  The diagnostic catheter was then removed and a 4 mm diameter by 10 cm length Lutonix drug-coated angioplasty balloon was inflated to 10 atm for 1 minute.  Completion imaging showed only about a 20 to 30% residual stenosis in the proximal segment and a less than 20% residual stenosis in the mid to distal segment.  I then used the Kumpe catheter and the 0.018 advantage wire to get into the origin of the anterior tibial artery.  Selective imaging of the anterior tibial artery did confirm the long segment occlusion but there was distal reconstitution.  After exchanging for a CXI catheter and telescoping a 0.018 CXI catheter through a 0.035 CXI catheter, I was able to advance these catheters with the 0.018 advantage wire across the occlusion and confirm intraluminal flow in the anterior tibial artery at the ankle.  The diagnostic catheters were removed and I proceeded with treatment of the right anterior tibial artery.  A 3 mm diameter by 30 cm length angioplasty balloon was inflated from the proximal foot up to the proximal to mid anterior tibial artery.  This was taken to 8 atm for 1 minute.  There was then pulled back to treat the proximal anterior tibial artery and inflated to 10 atm for 1 minute.  Completion imaging showed markedly improved flow with inline flow into the foot through the anterior tibial  artery with only about a 25% residual stenosis proximally and about a 20 to 30% residual stenosis in the distal segment at the reentry point.  The peroneal artery and posterior tibial arteries also remained continuous distally so he now had three-vessel runoff.  I elected to terminate the procedure. The sheath was removed and StarClose closure device was deployed in the left femoral artery with excellent hemostatic result. The patient was taken to the recovery room in stable condition having tolerated the procedure well.  Findings:               Aortogram:  Imaging was fairly poor but the renal arteries appeared patent all of the right renal artery was not particularly well seen potentially due to catheter position.  The aorta and iliac arteries were patent without significant stenosis including the previously placed stents in the iliac arteries             Right Lower Extremity:  This demonstrated mildly irregular common femoral artery,  profunda femoris artery, and superficial femoral artery without any stenosis of more than about 30%.  The popliteal artery was also mildly irregular.  The tibioperoneal trunk had about a 55 to 60% stenosis at its origin and then another stenosis in the midsegment in the 50% range.  The posterior tibial artery was large and patent into the foot.  The anterior tibial artery occluded about 5 cm beyond its origin but did appear to have distal reconstitution in the lower leg.  The peroneal artery was small but continuous and provide a second runoff vessel distally   Disposition: Patient was taken to the recovery room in stable condition having tolerated the procedure well.  Complications: None  Leotis Pain 12/20/2018 12:43 PM   This note was created with Dragon Medical transcription system. Any errors in dictation are purely unintentional.

## 2018-12-20 NOTE — H&P (Signed)
[facility] History & Physical Update  The patient was interviewed and re-examined.  The patient's previous History and Physical has been reviewed and is unchanged.  There is no change in the plan of care. We plan to proceed with the scheduled procedure.  Leotis Pain, MD  12/20/2018, 11:28 AM

## 2018-12-20 NOTE — TOC Initial Note (Signed)
Transition of Care [facility]) - Initial/Assessment Note    Patient Details  Name: Jose [name] MRN: [id number] Date of Birth: 1945-12-21  Transition of Care [facility]) CM/SW Contact:    [name], RN Phone Number: 12/20/2018, 4:00 PM  Clinical Narrative:                  Patient admitted from home due to gangrene on the right foot.  S/p debridement on 7/17  States he resides with his sister Uses [employer] for transportation to HD Has cane and RW in the home. Requesting bedside commode  [facility], and his son picks up his medication for him  PT has assessed patient and recommended SNF.  Patient has previously been to SNF.  Patient is declining SNF this admission and wishes to return home with home health services.  Patient is open with [facility] home health. MD updated   Expected Discharge Plan: [place] Barriers to Discharge: Continued Medical Work up   Patient Goals and CMS Choice        Expected Discharge Plan and Services Expected Discharge Plan: [place]   Discharge Planning Services: CM Consult   Living arrangements for the past 2 months: Single Family Home Expected Discharge Date: 12/17/18                                    Prior Living Arrangements/Services Living arrangements for the past 2 months: Single Family Home Lives with:: Relatives Patient language and need for interpreter reviewed:: Yes Do you feel safe going back to the place where you live?: Yes      Need for Family Participation in Patient Care: Yes (Comment) Care giver support system in place?: Yes (comment) Current home services: DME, Home RN, Home PT Criminal Activity/Legal Involvement Pertinent to Current Situation/Hospitalization: No - Comment as needed  Activities of Daily Living Home Assistive Devices/Equipment: Cane (specify quad or straight) ADL Screening (condition at time of admission) Patient's cognitive ability adequate to safely  complete daily activities?: Yes Is the patient deaf or have difficulty hearing?: No Does the patient have difficulty seeing, even when wearing glasses/contacts?: No Does the patient have difficulty concentrating, remembering, or making decisions?: No Patient able to express need for assistance with ADLs?: Yes Does the patient have difficulty dressing or bathing?: No Independently performs ADLs?: Yes (appropriate for developmental age) Does the patient have difficulty walking or climbing stairs?: Yes Weakness of Legs: Both Weakness of Arms/Hands: None  Permission Sought/Granted                  Emotional Assessment Appearance:: Appears stated age Attitude/Demeanor/Rapport: Gracious Affect (typically observed): Accepting Orientation: : Oriented to Self, Oriented to Place, Oriented to  Time, Oriented to Situation Alcohol / Substance Use: Tobacco Use Psych Involvement: No (comment)  Admission diagnosis:  PVD Patient Active Problem List   Diagnosis Date Noted  . Foot ulcer ([place]) 12/15/2018  . Ischemic foot 10/11/2018  . Atherosclerosis of native arteries of the extremities with gangrene ([place]) 07/09/2018  . Gangrene of toe of right foot ([place]) 06/13/2018  . Diabetes mellitus ([place]) 04/16/2018  . Hypertension 04/16/2018  . ESRD on dialysis ([place]) 04/16/2018  . Malnutrition of moderate degree 03/27/2018  . Renal failure 03/21/2018   PCP:  [name], MD Pharmacy:   [id number] [place], [place] [address]  [place] [address] Phone: [phone] Fax: [phone]     Social Determinants of Health (SDOH) Interventions    Readmission Risk Interventions Readmission Risk Prevention Plan 12/20/2018 10/15/2018 10/14/2018  Transportation Screening Complete Complete Complete  PCP or Specialist Appt within 3-5 Days - (No Data) -  [place] or Home Care Consult Complete - Complete  Social Work Consult for [place] Planning/Counseling - - Not Complete   Palliative Care Screening - - Not Applicable  Medication Review Press photographer) Complete - Complete  Some recent data might be hidden

## 2018-12-20 NOTE — Progress Notes (Signed)
Jose [name], [place] 12/20/18  Subjective:  Patient well-known to [place]. Due for dialysis again tomorrow. Going down to vascular lab again today.    Objective:  Vital signs in last 24 hours:  Temp:  [97.7 F (36.5 C)-98.8 F (37.1 C)] 97.9 F (36.6 C) (07/20 1032) Pulse Rate:  [43-56] 56 (07/20 1032) Resp:  [19-20] 20 (07/19 2110) BP: (112-138)/(66-83) 133/71 (07/20 1032) SpO2:  [95 %-100 %] 95 % (07/20 1032)  Weight change:  Filed Weights   12/16/18 0910 12/16/18 0930 12/17/18 0951  Weight: 67.8 kg 67.8 kg 67.8 kg    Intake/Output:    Intake/Output Summary (Last 24 hours) at 12/20/2018 1145 Last data filed at 12/20/2018 0530 Gross per 24 hour  Intake 865.12 ml  Output 300 ml  Net 565.12 ml    Gen:   No acute distress Head:   Normocephalic/atraumatic Eyes/ENT:  conjunctiva/corneas clear,  moist oral mucus membranes Neck:  Supple,  thyroid: not enlarged, no JVD Lungs:   Clear to auscultation bilaterally, respirations unlabored Heart:   no rub or gallop Abdomen:   Soft, non-tender, bowel sounds active  Extremities: no cyanosis or edema.  Right foot bandaged Skin:  Skin color, texture, turgor normal  Neurologic: Alert and oriented, able to answer questions appropriately Access:           Left forearm AVF     Basic Metabolic Panel:  Recent Labs  Lab 12/15/18 1751 12/16/18 0510 12/18/18 0429  NA 138 137  --   K 3.6 4.2  --   CL 103 103  --   CO2 26 24  --   GLUCOSE 96 131*  --   BUN 48* 51*  --   CREATININE 4.61* 4.93* 4.65*  CALCIUM 8.9 8.9  --   PHOS  --  4.4  --      CBC: Recent Labs  Lab 12/15/18 1751 12/16/18 0510 12/18/18 0429  WBC 6.9 5.3 7.2  HGB 11.4* 10.4* 10.4*  HCT 34.4* 32.9* 31.9*  MCV 100.6* 102.5* 100.0  PLT 165 133* 140*      Lab Results  Component Value Date   HEPBSAG Negative 03/22/2018   HEPBSAB Reactive 03/22/2018   HEPBIGM Negative 03/22/2018      Microbiology:  Recent Results  (from the past 240 hour(s))  SARS Coronavirus 2 (CEPHEID - Performed in [facility] hospital lab), Hosp Order     Status: None   Collection Time: 12/15/18  4:53 PM   Specimen: Nasopharyngeal Swab  Result Value Ref Range Status   SARS Coronavirus 2 NEGATIVE NEGATIVE Final    Comment: (NOTE) If result is NEGATIVE SARS-CoV-2 target nucleic acids are NOT DETECTED. The SARS-CoV-2 RNA is generally detectable in upper and lower  respiratory specimens during the acute phase of infection. The lowest  concentration of SARS-CoV-2 viral copies this assay can detect is 250  copies / mL. A negative result does not preclude SARS-CoV-2 infection  and should not be used as the sole basis for treatment or other  patient management decisions.  A negative result may occur with  improper specimen collection / handling, submission of specimen other  than nasopharyngeal swab, presence of viral mutation(s) within the  areas targeted by this assay, and inadequate number of viral copies  (<250 copies / mL). A negative result must be combined with clinical  observations, patient history, and epidemiological information. If result is POSITIVE SARS-CoV-2 target nucleic acids are DETECTED. The SARS-CoV-2 RNA is generally detectable in upper  and lower  respiratory specimens dur ing the acute phase of infection.  Positive  results are indicative of active infection with SARS-CoV-2.  Clinical  correlation with patient history and other diagnostic information is  necessary to determine patient infection status.  Positive results do  not rule out bacterial infection or co-infection with other viruses. If result is PRESUMPTIVE POSTIVE SARS-CoV-2 nucleic acids MAY BE PRESENT.   A presumptive positive result was obtained on the submitted specimen  and confirmed on repeat testing.  While 2019 novel coronavirus  (SARS-CoV-2) nucleic acids may be present in the submitted sample  additional confirmatory testing may be  necessary for epidemiological  and / or clinical management purposes  to differentiate between  SARS-CoV-2 and other Sarbecovirus currently known to infect humans.  If clinically indicated additional testing with an alternate test  methodology [phone]) is advised. The SARS-CoV-2 RNA is generally  detectable in upper and lower respiratory sp ecimens during the acute  phase of infection. The expected result is Negative. Fact Sheet for Patients:  StrictlyIdeas.no Fact Sheet for Healthcare Providers: BankingDealers.co.za This test is not yet approved or cleared by the [place] FDA and has been authorized for detection and/or diagnosis of SARS-CoV-2 by FDA under an Emergency Use Authorization (EUA).  This EUA will remain in effect (meaning this test can be used) for the duration of the COVID-19 declaration under Section 564(b)(1) of the Act, 21 U.S.C. section 360bbb-3(b)(1), unless the authorization is terminated or revoked sooner. Performed at [facility], [address] [place], [place] [address]   MRSA PCR Screening     Status: None   Collection Time: 12/15/18  4:53 PM   Specimen: Nasal Mucosa; Nasopharyngeal  Result Value Ref Range Status   MRSA by PCR NEGATIVE NEGATIVE Final    Comment:        The GeneXpert MRSA Assay (FDA approved for NASAL specimens only), is one component of a comprehensive MRSA colonization surveillance program. It is not intended to diagnose MRSA infection nor to guide or monitor treatment for MRSA infections. Performed at [facility], [address] [place], [place] [address]   CULTURE, BLOOD (ROUTINE X 2) w Reflex to ID Panel     Status: None   Collection Time: 12/15/18  5:51 PM   Specimen: BLOOD  Result Value Ref Range Status   Specimen Description BLOOD BLOOD RIGHT HAND  Final   Special Requests   Final    BOTTLES DRAWN AEROBIC AND ANAEROBIC Blood Culture adequate volume    Culture   Final    NO GROWTH 5 DAYS Performed at [facility], [address] [place], [place] [address]    Report Status 12/20/2018 FINAL  Final  CULTURE, BLOOD (ROUTINE X 2) w Reflex to ID Panel     Status: None   Collection Time: 12/15/18  5:51 PM   Specimen: BLOOD  Result Value Ref Range Status   Specimen Description BLOOD BLOOD RIGHT WRIST  Final   Special Requests   Final    BOTTLES DRAWN AEROBIC AND ANAEROBIC Blood Culture adequate volume   Culture   Final    NO GROWTH 5 DAYS Performed at [facility], [address] [place], [place] [address]    Report Status 12/20/2018 FINAL  Final  Aerobic/Anaerobic Culture (surgical/deep wound)     Status: None (Preliminary result)   Collection Time: 12/17/18 10:36 AM   Specimen: [facility] Other; Tissue  Result Value Ref Range Status   Specimen Description  Final    WOUND RIGHT FOOT Performed at [facility], [place] [address] [place], [place] [address]    Special Requests   Final    NONE Performed at [facility], [place], [place] [address]    Gram Stain   Final    RARE WBC PRESENT, PREDOMINANTLY PMN FEW GRAM POSITIVE COCCI IN PAIRS RARE GRAM NEGATIVE RODS Performed at [facility], [place] [address], [place], [place] [address]    Culture   Final    FEW PROTEUS MIRABILIS FEW KLEBSIELLA PNEUMONIAE CULTURE REINCUBATED FOR BETTER GROWTH NO ANAEROBES ISOLATED; CULTURE IN PROGRESS FOR 5 DAYS    Report Status PENDING  Incomplete   Organism ID, Bacteria PROTEUS MIRABILIS  Final   Organism ID, Bacteria KLEBSIELLA PNEUMONIAE  Final      Susceptibility   Klebsiella pneumoniae - MIC*    AMPICILLIN RESISTANT Resistant     CEFAZOLIN <=4 SENSITIVE Sensitive     CEFEPIME <=1 SENSITIVE Sensitive     CEFTAZIDIME <=1 SENSITIVE Sensitive     CEFTRIAXONE <=1 SENSITIVE Sensitive     CIPROFLOXACIN <=0.25 SENSITIVE Sensitive     GENTAMICIN <=1 SENSITIVE Sensitive     IMIPENEM <=0.25  SENSITIVE Sensitive     TRIMETH/SULFA <=20 SENSITIVE Sensitive     AMPICILLIN/SULBACTAM <=2 SENSITIVE Sensitive     PIP/TAZO <=4 SENSITIVE Sensitive     Extended ESBL NEGATIVE Sensitive     * FEW KLEBSIELLA PNEUMONIAE   Proteus mirabilis - MIC*    AMPICILLIN <=2 SENSITIVE Sensitive     CEFAZOLIN <=4 SENSITIVE Sensitive     CEFEPIME <=1 SENSITIVE Sensitive     CEFTAZIDIME <=1 SENSITIVE Sensitive     CEFTRIAXONE <=1 SENSITIVE Sensitive     CIPROFLOXACIN <=0.25 SENSITIVE Sensitive     GENTAMICIN <=1 SENSITIVE Sensitive     IMIPENEM 2 SENSITIVE Sensitive     TRIMETH/SULFA <=20 SENSITIVE Sensitive     AMPICILLIN/SULBACTAM <=2 SENSITIVE Sensitive     PIP/TAZO <=4 SENSITIVE Sensitive     * FEW PROTEUS MIRABILIS    Coagulation Studies: No results for input(s): LABPROT, INR in the last 72 hours.  Urinalysis: No results for input(s): COLORURINE, LABSPEC, PHURINE, GLUCOSEU, HGBUR, BILIRUBINUR, KETONESUR, PROTEINUR, UROBILINOGEN, NITRITE, LEUKOCYTESUR in the last 72 hours.  Invalid input(s): APPERANCEUR    Imaging: No results found.   Medications:   . [MAR Hold] sodium chloride Stopped (12/20/18 0026)  . sodium chloride    . [MAR Hold] cefTRIAXone (ROCEPHIN)  IV 1 g (12/20/18 1013)   . [MAR Hold] allopurinol  100 mg Oral Daily  . [MAR Hold] amLODipine  10 mg Oral Daily  . [MAR Hold] atorvastatin  10 mg Oral q1800  . [MAR Hold] calcium acetate  1,334 mg Oral TID WC  . [MAR Hold] Chlorhexidine Gluconate Cloth  6 each Topical Q0600  . [MAR Hold] cloNIDine  0.2 mg Oral BID  . [MAR Hold] epoetin (EPOGEN/PROCRIT) injection  4,000 Units Intravenous Q T,Th,Sa-HD  . [MAR Hold] escitalopram  10 mg Oral Daily  . [MAR Hold] feeding supplement (NEPRO CARB STEADY)  237 mL Oral BID BM  . fentaNYL      . [MAR Hold] gabapentin  300 mg Oral QHS  . heparin      . [MAR Hold] heparin injection (subcutaneous)  5,000 Units Subcutaneous Q8H  . [MAR Hold] hydrALAZINE  25 mg Oral Q8H  . midazolam       . [MAR Hold] multivitamin  1 tablet  Oral QHS  . [MAR Hold] nicotine  21 mg Transdermal Daily  . [MAR Hold] pneumococcal 23 valent vaccine  0.5 mL Intramuscular Tomorrow-1000  . [MAR Hold] sodium chloride flush  3 mL Intravenous Q12H   [MAR Hold] sodium chloride, [MAR Hold] acetaminophen **OR** [MAR Hold] acetaminophen, diphenhydrAMINE, famotidine, [MAR Hold] HYDROcodone-acetaminophen, HYDROmorphone (DILAUDID) injection, methylPREDNISolone (SOLU-MEDROL) injection, midazolam, [MAR Hold]  morphine injection, [MAR Hold] ondansetron **OR** [MAR Hold] ondansetron (ZOFRAN) IV, ondansetron (ZOFRAN) IV, [MAR Hold] senna-docusate, [MAR Hold] sodium chloride flush  Assessment/ Plan:  73 y.o. African-American male with diabetes, end-stage renal disease, history of prostate cancer, history of stroke, peripheral vascular disease, history of bilateral transmetatarsal amputation  Active Problems:   Foot ulcer ([place])  [facility]  TuThSa-2 TW 65 kg  #.  End-stage renal disease.  Hemodialysis TTS schedule Recent Labs    12/15/18 1751 12/16/18 0510 12/18/18 0429  CREATININE 4.61* 4.93* 4.65*  Patient due for dialysis again tomorrow.  Orders to be prepared.  #. Anemia of CKD  Lab Results  Component Value Date   HGB 10.4 (L) 12/18/2018  Will continue on low-dose Epogen with dialysis sessions.  #. SHPTH    Component Value Date/Time   PTH 226 (H) 03/25/2018 1557   Lab Results  Component Value Date   PHOS 4.4 12/16/2018  Continue low dose PhosLo and reevaluate phosphorus tomorrow.  #. HTN with CKD Continue amlodipine, clonidine, hydralazine  #. Diabetes type 2 with CKD Hgb A1c MFr Bld (%)  Date Value  12/15/2018 5.6   # Right Foot infection -Currently being treated with IV vancomycin and clindamycin.  Pharmacist to dose Surgical debridement done 12/17/18. Angiogram is planned for today.   LOS: 5 Jessel Gettinger 7/20/202011:45 AM  [place], [employer]

## 2018-12-21 ENCOUNTER — Encounter: Payer: Self-pay | Admitting: Vascular Surgery

## 2018-12-21 LAB — GLUCOSE, CAPILLARY
Glucose-Capillary: 88 mg/dL (ref 70–99)
Glucose-Capillary: 88 mg/dL (ref 70–99)
Glucose-Capillary: 87 mg/dL (ref 70–99)

## 2018-12-21 LAB — SARS CORONAVIRUS 2 BY RT PCR (HOSPITAL ORDER, PERFORMED IN ~~LOC~~ HOSPITAL LAB): SARS Coronavirus 2: NEGATIVE

## 2018-12-21 LAB — ABO/RH: ABO/RH(D): A POS

## 2018-12-21 MED ORDER — CEFDINIR 300 MG PO CAPS: 300.0000 mg | ORAL_CAPSULE | ORAL | 0 refills | Status: AC

## 2018-12-21 MED ORDER — HYDROCODONE-ACETAMINOPHEN 5-325 MG PO TABS: 1.0000 | ORAL_TABLET | Freq: Four times a day (QID) | ORAL | 0 refills | Status: DC | PRN

## 2018-12-21 MED ORDER — CALCIUM ACETATE (PHOS BINDER) 667 MG PO CAPS: 1334.0000 mg | ORAL_CAPSULE | Freq: Three times a day (TID) | ORAL | 0 refills | Status: DC

## 2018-12-21 NOTE — Progress Notes (Signed)
Daily Progress Note   Subjective  - 1 Day Post-Op  F/u right foot debridment  Objective Vitals:   12/20/18 1315 12/20/18 1336 12/20/18 1954 12/21/18 0438  BP: 131/83 124/72 130/83 136/78  Pulse: 66 (!) 56 60 (!) 56  Resp: 12  16 16   Temp:  97.9 F (36.6 C) 97.6 F (36.4 C) 98.6 F (37 C)  TempSrc:  Oral Oral Oral  SpO2: 98% 93% 99% 98%  Weight:    69.7 kg  Height:        Physical Exam: Wound looks good.  Some drainage on incision but wiped away and incision intact. Mild maceration.  Laboratory CBC    Component Value Date/Time   WBC 7.2 12/18/2018 0429   HGB 10.4 (L) 12/18/2018 0429   HGB 13.6 08/18/2011 1054   HCT 31.9 (L) 12/18/2018 0429   PLT 140 (L) 12/18/2018 0429    BMET    Component Value Date/Time   NA 137 12/16/2018 0510   NA 141 08/18/2011 1054   K 4.2 12/16/2018 0510   K 4.5 08/18/2011 1054   CL 103 12/16/2018 0510   CL 108 (H) 08/18/2011 1054   CO2 24 12/16/2018 0510   CO2 23 08/18/2011 1054   GLUCOSE 131 (H) 12/16/2018 0510   GLUCOSE 119 (H) 08/18/2011 1054   BUN 51 (H) 12/16/2018 0510   BUN 25 (H) 08/18/2011 1054   CREATININE 4.65 (H) 12/18/2018 0429   CREATININE 2.10 (H) 08/18/2011 1054   CALCIUM 8.9 12/16/2018 0510   CALCIUM 9.0 08/18/2011 1054   GFRNONAA 12 (L) 12/18/2018 0429   GFRNONAA 34 (L) 08/18/2011 1054   GFRAA 14 (L) 12/18/2018 0429   GFRAA 41 (L) 08/18/2011 1054    Assessment/Planning: Grangrene s/p debridment delayed primary closure S/p angio with revascularization   Dressing changed.  If goes to SNF dressing should be changed daily.  Cleanse wound with betadine and apply calcium alginate with silver and cover with bulky bandage.  C/W NWB to right foot.  F/u with me in 2 weeks.  OK to d/c from podiatry standpoint.    Samara Deist A  12/21/2018, 8:05 AM

## 2018-12-21 NOTE — Progress Notes (Signed)
[place] [facility] [place] NAME: Jose [name]    MR#:  [id number]  DATE OF BIRTH:  12-May-1946  SUBJECTIVE:   Patient presented to the hospital due to gangrene of the right foot.  S/p debridement of the right foot wound 7/17.    -Patient does have some  right foot pain.  REVIEW OF SYSTEMS:    Review of Systems  Constitutional: Negative for chills and fever.  HENT: Negative for congestion and tinnitus.   Eyes: Negative for blurred vision and double vision.  Respiratory: Negative for cough, shortness of breath and wheezing.   Cardiovascular: Negative for chest pain, orthopnea and PND.  Gastrointestinal: Negative for abdominal pain, diarrhea, nausea and vomiting.  Genitourinary: Negative for dysuria and hematuria.  Musculoskeletal: Positive for joint pain (Right foot pain).  Neurological: Negative for dizziness, sensory change and focal weakness.  All other systems reviewed and are negative.   Nutrition: Renal/Carb control diet.  Tolerating Diet: Yes Tolerating PT:SNF DRUG ALLERGIES:   Allergies  Allergen Reactions   Penicillin G Other (See Comments)    Other reaction(s): Unknown DID THE REACTION INVOLVE: Swelling of the face/tongue/throat, SOB, or low BP? Unknown Sudden or severe rash/hives, skin peeling, or the inside of the mouth or nose? Unknown Did it require medical treatment? Unknown When did it last happen?unknown If all above answers are "NO", may proceed with cephalosporin use.     VITALS:  Blood pressure 136/78, pulse (!) 56, temperature 98.6 F (37 C), temperature source Oral, resp. rate 16, height 6\' 1"  (1.854 m), weight 69.7 kg, SpO2 98 %.  PHYSICAL EXAMINATION:   Physical Exam  GENERAL:  73 y.o.-year-old patient lying in bed in no acute distress.  EYES: Pupils equal, round, reactive to light and accommodation. No scleral icterus. Extraocular muscles intact.  HEENT: Head atraumatic, normocephalic. Oropharynx  and nasopharynx clear.  NECK:  Supple, no jugular venous distention. No thyroid enlargement, no tenderness.  LUNGS: Normal breath sounds bilaterally, no wheezing, rales, rhonchi. No use of accessory muscles of respiration.  CARDIOVASCULAR: S1, S2 normal. No murmurs, rubs, or gallops.  ABDOMEN: Soft, nontender, nondistended. Bowel sounds present. No organomegaly or mass.  EXTREMITIES: No cyanosis, clubbing or edema b/l.  Right foot dressing in place.    NEUROLOGIC: Cranial nerves II through XII are intact. No focal Motor or sensory deficits b/l. Globally weak.  PSYCHIATRIC: The patient is alert and oriented x 3.  SKIN: No obvious rash, lesion, or ulcer.   Left upper Ext. AV fistula with good bruit/thrill.   LABORATORY PANEL:   CBC Recent Labs  Lab 12/18/18 0429  WBC 7.2  HGB 10.4*  HCT 31.9*  PLT 140*   ------------------------------------------------------------------------------------------------------------------  Chemistries  Recent Labs  Lab 12/15/18 1751 12/16/18 0510 12/18/18 0429  NA 138 137  --   K 3.6 4.2  --   CL 103 103  --   CO2 26 24  --   GLUCOSE 96 131*  --   BUN 48* 51*  --   CREATININE 4.61* 4.93* 4.65*  CALCIUM 8.9 8.9  --   AST 23  --   --   ALT 18  --   --   ALKPHOS 48  --   --   BILITOT 0.7  --   --    ------------------------------------------------------------------------------------------------------------------  Cardiac Enzymes No results for input(s): TROPONINI in the last 168 hours. ------------------------------------------------------------------------------------------------------------------  RADIOLOGY:  No results found.   ASSESSMENT AND PLAN:  73 year old male patient with a known history of diabetes mellitus type 2, glaucoma, gout, hyperlipidemia, hypertension, prostate cancer, ESRD on dialysis Tuesday Thursday Saturday, stroke was referred from podiatry office due to worsening drainage and pain in the right foot.   1.  Gangrene of the right foot-patient had recent debridement of the right foot ulcer but returns back due to worsening pain and drainage in the right foot. -Seen by podiatry Dr Vickki Muff status post debridement and excision of the distal first metatarsal head on 12/17/2018 -Continue local wound care--Cleanse wound with betadine and apply calcium alginate with silver and cover with bulky bandage. C/W NWB to right foot. -change to IV rocephin--- to oral Cefdinir. WC growing proteus . D/w pharmacy -can start PT today per podiatry-- recommends rehab patient refusing -patient seen by vascular--s/p angiogram right LE wit PTA of right tibioperoneal and proxima peroneal artery.  PTA of right ant tibial artery  2.  End-stage renal disease on hemodialysis- nephrology has been consulted. -Continue dialysis on a Tuesday Thursday Saturday schedule.    3.  Essential hypertension-continue Norvasc, hydralazine, clonidine.  4.  History of gout-no acute attack.  Continue allopurinol.  5.  Diabetes type 2 with ESRD on dialysis-continue glipizide, sliding scale insulin. - BS table.   6.  Tobacco abuse-continue nicotine patch.  7.  Diabetic neuropathy-continue gabapentin.  8.  Secondary hyperparathyroidism-continue PhosLo.  Management plans discussed with the patient. Patient now is in agreement to go to rehab. Will discussed with care management to see if patient can discharge to rehab.  CODE STATUS: Full code  DVT Prophylaxis: Hep. SQ  TOTAL TIME TAKING CARE OF THIS PATIENT: 25 minutes.   POSSIBLE D/C IN 1-2 DAYS, DEPENDING ON CLINICAL CONDITION.   Jose [name] M.D on 12/21/2018 at 9:03 AM  Between 7am to 6pm - Pager - [phone]  After 6pm go to [email or web address] - Proofreader  [facility]  Office  [phone]  CC: Primary care physician; [name], MD

## 2018-12-21 NOTE — NC FL2 (Signed)
[place] LEVEL OF CARE SCREENING TOOL     IDENTIFICATION  Patient Name: Jose [name] Birthdate: 1946-02-13 Sex: male Admission Date (Current Location): 12/15/2018  [place] and [place] Number:  Engineering geologist and Address:  [facility], [address] [place], [place] [address]      Provider Number: [id number]  Attending Physician Name and Address:  Fritzi Mandes, MD  Relative Name and Phone Number:       Current Level of Care: Hospital Recommended Level of Care: [place] Prior Approval Number:    Date Approved/Denied:   PASRR Number: [id number]  Discharge Plan: SNF    Current Diagnoses: Patient Active Problem List   Diagnosis Date Noted  . Foot ulcer ([place]) 12/15/2018  . Ischemic foot 10/11/2018  . Atherosclerosis of native arteries of the extremities with gangrene ([place]) 07/09/2018  . Gangrene of toe of right foot ([place]) 06/13/2018  . Diabetes mellitus ([place]) 04/16/2018  . Hypertension 04/16/2018  . ESRD on dialysis ([place]) 04/16/2018  . Malnutrition of moderate degree 03/27/2018  . Renal failure 03/21/2018    Orientation RESPIRATION BLADDER Height & Weight     Self, Time, Situation, Place  Normal Continent Weight: 69.7 kg Height:  6\' 1"  (185.4 cm)  BEHAVIORAL SYMPTOMS/MOOD NEUROLOGICAL BOWEL NUTRITION STATUS      Continent Diet(Renal carb modifed with fluid restrictions)  AMBULATORY STATUS COMMUNICATION OF NEEDS Skin   Extensive Assist                           Personal Care Assistance Level of Assistance              Functional Limitations Info             SPECIAL CARE FACTORS FREQUENCY  PT (By licensed PT), OT (By licensed OT)(Outpatient HD)                    Contractures Contractures Info: Not present    Additional Factors Info  Code Status, Allergies Code Status Info: Full Allergies Info: Penicillin G           Current Medications (12/21/2018):  This is the  current hospital active medication list Current Facility-Administered Medications  Medication Dose Route Frequency Provider Last Rate Last Dose  . 0.9 %  sodium chloride infusion  250 mL Intravenous PRN Algernon Huxley, MD   Stopped at 12/20/18 0026  . acetaminophen (TYLENOL) tablet 650 mg  650 mg Oral Q6H PRN Algernon Huxley, MD       Or  . acetaminophen (TYLENOL) suppository 650 mg  650 mg Rectal Q6H PRN Algernon Huxley, MD      . allopurinol (ZYLOPRIM) tablet 100 mg  100 mg Oral Daily Algernon Huxley, MD   100 mg at 12/21/18 0947  . amLODipine (NORVASC) tablet 10 mg  10 mg Oral Daily Algernon Huxley, MD   10 mg at 12/20/18 1523  . atorvastatin (LIPITOR) tablet 10 mg  10 mg Oral q1800 Algernon Huxley, MD   10 mg at 12/20/18 1715  . calcium acetate (PHOSLO) capsule 1,334 mg  1,334 mg Oral TID WC Algernon Huxley, MD   1,334 mg at 12/21/18 0947  . cefdinir (OMNICEF) capsule 300 mg  300 mg Oral Cherylynn Ridges, MD      . Chlorhexidine Gluconate Cloth 2 % PADS 6 each  6 each Topical Q0600 Dew, Erskine Squibb, MD  6 each at 12/21/18 0600  . cloNIDine (CATAPRES) tablet 0.2 mg  0.2 mg Oral BID Algernon Huxley, MD   0.2 mg at 12/20/18 2117  . epoetin alfa (EPOGEN) injection 4,000 Units  4,000 Units Intravenous Q T,Th,Sa-HD Algernon Huxley, MD   4,000 Units at 12/18/18 1857  . escitalopram (LEXAPRO) tablet 10 mg  10 mg Oral Daily Algernon Huxley, MD   10 mg at 12/21/18 0947  . feeding supplement (NEPRO CARB STEADY) liquid 237 mL  237 mL Oral BID BM Algernon Huxley, MD   237 mL at 12/17/18 1530  . gabapentin (NEURONTIN) capsule 300 mg  300 mg Oral QHS Algernon Huxley, MD   300 mg at 12/20/18 2118  . heparin injection 5,000 Units  5,000 Units Subcutaneous Q8H Algernon Huxley, MD   5,000 Units at 12/21/18 0503  . hydrALAZINE (APRESOLINE) tablet 25 mg  25 mg Oral Q8H Algernon Huxley, MD   25 mg at 12/21/18 0502  . HYDROcodone-acetaminophen (NORCO/VICODIN) 5-325 MG per tablet 1-2 tablet  1-2 tablet Oral Q4H PRN Algernon Huxley, MD   2 tablet at 12/21/18  0946  . morphine 4 MG/ML injection 1 mg  1 mg Intravenous Q2H PRN Algernon Huxley, MD      . multivitamin (RENA-VIT) tablet 1 tablet  1 tablet Oral QHS Algernon Huxley, MD   1 tablet at 12/20/18 2117  . nicotine (NICODERM CQ - dosed in mg/24 hours) patch 21 mg  21 mg Transdermal Daily Algernon Huxley, MD   21 mg at 12/21/18 0947  . ondansetron (ZOFRAN) tablet 4 mg  4 mg Oral Q6H PRN Algernon Huxley, MD       Or  . ondansetron (ZOFRAN) injection 4 mg  4 mg Intravenous Q6H PRN Algernon Huxley, MD   4 mg at 12/17/18 1030  . ondansetron (ZOFRAN) injection 4 mg  4 mg Intravenous Q6H PRN Algernon Huxley, MD      . pneumococcal 23 valent vaccine (PNU-IMMUNE) injection 0.5 mL  0.5 mL Intramuscular Tomorrow-1000 Dew, Erskine Squibb, MD      . senna-docusate (Senokot-S) tablet 1 tablet  1 tablet Oral QHS PRN Algernon Huxley, MD      . sodium chloride flush (NS) 0.9 % injection 3 mL  3 mL Intravenous Q12H Algernon Huxley, MD   3 mL at 12/21/18 0947  . sodium chloride flush (NS) 0.9 % injection 3 mL  3 mL Intravenous PRN Lucky Cowboy Erskine Squibb, MD         Discharge Medications: Please see discharge summary for a list of discharge medications.  Relevant Imaging Results:  Relevant Lab Results:   Additional Information ss [id number]  Pearley Baranek T, RN

## 2018-12-21 NOTE — Discharge Summary (Signed)
[facility] NAME: Jose [name]    MR#:  [id number]  DATE OF BIRTH:  [birth year]  DATE OF ADMISSION:  12/15/2018 ADMITTING PHYSICIAN: Max Sane, MD  DATE OF DISCHARGE: 12/21/2018  PRIMARY CARE PHYSICIAN: [name], MD    ADMISSION DIAGNOSIS:  PVD  DISCHARGE DIAGNOSIS:   *gangrene right foot status post debridement *peripheral vascular disease status post angiogram with angioplasty right lower extremity  SECONDARY DIAGNOSIS:   Past Medical History:  Diagnosis Date  . Chronic kidney disease   . Depression   . Diabetes mellitus without complication ([place])   . Glaucoma   . Glaucoma   . Gout   . Heart murmur   . HLD (hyperlipidemia)   . Hypertension   . Prostate cancer ([place])   . Renal failure    dialysis t/t/s  . Stroke ([place])    tia x 2    HOSPITAL COURSE:   73 year old male patientwith a known history ofdiabetes mellitus type 2, glaucoma, gout, hyperlipidemia, hypertension, prostate cancer, ESRD on dialysis Tuesday Thursday Saturday, stroke was referred from podiatry office due to worsening drainage and pain in the right foot.   1. Gangrene of the right foot-patient had recent debridement of the right foot ulcer but returns back due to worsening pain and drainage in the right foot. -Seen by podiatry Dr Vickki Muff status post debridement and excision of the distal first metatarsal head on 12/17/2018 -Continue local wound care--Cleanse wound with betadine and apply calcium alginate with silver and cover with bulky bandage. C/W NWB to right foot. -change to IV rocephin--- to oral Cefdinir. WC growing proteus . D/w pharmacy -can start PT today per podiatry-- recommends rehab patient refusing -patient seen by vascular--s/p angiogram right LE wit PTA of right tibioperoneal and proxima peroneal artery.  PTA of right ant tibial artery  2.  End-stage renal disease on hemodialysis- nephrology has been  consulted. -Continue dialysis on a Tuesday Thursday Saturday schedule.    3.  Essential hypertension-continue Norvasc, hydralazine, clonidine.  4.  History of gout-no acute attack.  Continue allopurinol.  5.  Diabetes type 2 with ESRD on dialysis-continue glipizide, sliding scale insulin. - BS table.   6.  Tobacco abuse-continue nicotine patch.  7.  Diabetic neuropathy-continue gabapentin.  8.  Secondary hyperparathyroidism-continue PhosLo.  Management plans discussed with the patient. Patient now is in agreement to go to rehab. pt will discharged to rehab after dialysis today  CODE STATUS: Full code  DVT Prophylaxis: Hep. SQ CONSULTS OBTAINED:  Treatment Team:  Algernon Huxley, MD  DRUG ALLERGIES:   Allergies  Allergen Reactions  . Penicillin G Other (See Comments)    Other reaction(s): Unknown DID THE REACTION INVOLVE: Swelling of the face/tongue/throat, SOB, or low BP? Unknown Sudden or severe rash/hives, skin peeling, or the inside of the mouth or nose? Unknown Did it require medical treatment? Unknown When did it last happen?unknown If all above answers are "NO", may proceed with cephalosporin use.     DISCHARGE MEDICATIONS:   Allergies as of 12/21/2018      Reactions   Penicillin G Other (See Comments)   Other reaction(s): Unknown DID THE REACTION INVOLVE: Swelling of the face/tongue/throat, SOB, or low BP? Unknown Sudden or severe rash/hives, skin peeling, or the inside of the mouth or nose? Unknown Did it require medical treatment? Unknown When did it last happen?unknown If all above answers are "NO", may proceed with cephalosporin use.  Medication List    STOP taking these medications   multivitamin Tabs tablet   oxyCODONE-acetaminophen 5-325 MG tablet Commonly known as: PERCOCET/ROXICET     TAKE these medications   allopurinol 100 MG tablet Commonly known as: ZYLOPRIM Take 100 mg by mouth daily.   amLODipine 10 MG tablet Commonly  known as: NORVASC Take 10 mg by mouth daily.   aspirin 81 MG EC tablet Take 1 tablet (81 mg total) by mouth daily.   atorvastatin 10 MG tablet Commonly known as: LIPITOR Take 1 tablet (10 mg total) by mouth daily at 6 PM.   calcium acetate 667 MG capsule Commonly known as: PHOSLO Take 2 capsules (1,334 mg total) by mouth 3 (three) times daily with meals.   cefdinir 300 MG capsule Commonly known as: OMNICEF Take 1 capsule (300 mg total) by mouth every other day for 5 doses.   cloNIDine 0.2 MG tablet Commonly known as: CATAPRES TAKE 1 TABLET BY MOUTH TWICE A DAY   clopidogrel 75 MG tablet Commonly known as: PLAVIX Take 1 tablet (75 mg total) by mouth daily.   docusate sodium 100 MG capsule Commonly known as: COLACE Take 1 capsule (100 mg total) by mouth 2 (two) times daily as needed for mild constipation.   feeding supplement (NEPRO CARB STEADY) Liqd Take 237 mLs by mouth 2 (two) times daily between meals.   gabapentin 300 MG capsule Commonly known as: NEURONTIN TAKE 1 CAPSULE BY MOUTH THREE TIMES A DAY   glipiZIDE 5 MG tablet Commonly known as: GLUCOTROL Take 5 mg by mouth daily before breakfast.   hydrALAZINE 25 MG tablet Commonly known as: APRESOLINE Take 1 tablet (25 mg total) by mouth every 8 (eight) hours.   HYDROcodone-acetaminophen 5-325 MG tablet Commonly known as: NORCO/VICODIN Take 1-2 tablets by mouth every 6 (six) hours as needed for moderate pain or severe pain.   Lexapro 10 MG tablet Generic drug: escitalopram Take 10 mg by mouth daily.            Durable Medical Equipment  (From admission, onward)         Start     Ordered   12/20/18 1603  For home use only DME Bedside commode  Once    Question:  Patient needs a bedside commode to treat with the following condition  Answer:  Weakness   12/20/18 1603          If you experience worsening of your admission symptoms, develop shortness of breath, life threatening emergency, suicidal or  homicidal thoughts you must seek medical attention immediately by calling 911 or calling your MD immediately  if symptoms less severe.  You Must read complete instructions/literature along with all the possible adverse reactions/side effects for all the Medicines you take and that have been prescribed to you. Take any new Medicines after you have completely understood and accept all the possible adverse reactions/side effects.   Please note  You were cared for by a hospitalist during your hospital stay. If you have any questions about your discharge medications or the care you received while you were in the hospital after you are discharged, you can call the unit and asked to speak with the hospitalist on call if the hospitalist that took care of you is not available. Once you are discharged, your primary care physician will handle any further medical issues. Please note that NO REFILLS for any discharge medications will be authorized once you are discharged, as it is imperative that you return  to your primary care physician (or establish a relationship with a primary care physician if you do not have one) for your aftercare needs so that they can reassess your need for medications and monitor your lab values.  DATA REVIEW:   CBC  Recent Labs  Lab 12/18/18 0429  WBC 7.2  HGB 10.4*  HCT 31.9*  PLT 140*    Chemistries  Recent Labs  Lab 12/15/18 1751 12/16/18 0510 12/18/18 0429  NA 138 137  --   K 3.6 4.2  --   CL 103 103  --   CO2 26 24  --   GLUCOSE 96 131*  --   BUN 48* 51*  --   CREATININE 4.61* 4.93* 4.65*  CALCIUM 8.9 8.9  --   AST 23  --   --   ALT 18  --   --   ALKPHOS 48  --   --   BILITOT 0.7  --   --     Microbiology Results   Recent Results (from the past 240 hour(s))  SARS Coronavirus 2 (CEPHEID - Performed in [facility] hospital lab), Hosp Order     Status: None   Collection Time: 12/15/18  4:53 PM   Specimen: Nasopharyngeal Swab  Result Value Ref Range  Status   SARS Coronavirus 2 NEGATIVE NEGATIVE Final    Comment: (NOTE) If result is NEGATIVE SARS-CoV-2 target nucleic acids are NOT DETECTED. The SARS-CoV-2 RNA is generally detectable in upper and lower  respiratory specimens during the acute phase of infection. The lowest  concentration of SARS-CoV-2 viral copies this assay can detect is 250  copies / mL. A negative result does not preclude SARS-CoV-2 infection  and should not be used as the sole basis for treatment or other  patient management decisions.  A negative result may occur with  improper specimen collection / handling, submission of specimen other  than nasopharyngeal swab, presence of viral mutation(s) within the  areas targeted by this assay, and inadequate number of viral copies  (<250 copies / mL). A negative result must be combined with clinical  observations, patient history, and epidemiological information. If result is POSITIVE SARS-CoV-2 target nucleic acids are DETECTED. The SARS-CoV-2 RNA is generally detectable in upper and lower  respiratory specimens dur ing the acute phase of infection.  Positive  results are indicative of active infection with SARS-CoV-2.  Clinical  correlation with patient history and other diagnostic information is  necessary to determine patient infection status.  Positive results do  not rule out bacterial infection or co-infection with other viruses. If result is PRESUMPTIVE POSTIVE SARS-CoV-2 nucleic acids MAY BE PRESENT.   A presumptive positive result was obtained on the submitted specimen  and confirmed on repeat testing.  While 2019 novel coronavirus  (SARS-CoV-2) nucleic acids may be present in the submitted sample  additional confirmatory testing may be necessary for epidemiological  and / or clinical management purposes  to differentiate between  SARS-CoV-2 and other Sarbecovirus currently known to infect humans.  If clinically indicated additional testing with an alternate  test  methodology [phone]) is advised. The SARS-CoV-2 RNA is generally  detectable in upper and lower respiratory sp ecimens during the acute  phase of infection. The expected result is Negative. Fact Sheet for Patients:  StrictlyIdeas.no Fact Sheet for Healthcare Providers: BankingDealers.co.za This test is not yet approved or cleared by the [place] FDA and has been authorized for detection and/or diagnosis of SARS-CoV-2 by FDA under an Emergency  Use Authorization (EUA).  This EUA will remain in effect (meaning this test can be used) for the duration of the COVID-19 declaration under Section 564(b)(1) of the Act, 21 U.S.C. section 360bbb-3(b)(1), unless the authorization is terminated or revoked sooner. Performed at [facility], [address] [place], [place] [address]   MRSA PCR Screening     Status: None   Collection Time: 12/15/18  4:53 PM   Specimen: Nasal Mucosa; Nasopharyngeal  Result Value Ref Range Status   MRSA by PCR NEGATIVE NEGATIVE Final    Comment:        The GeneXpert MRSA Assay (FDA approved for NASAL specimens only), is one component of a comprehensive MRSA colonization surveillance program. It is not intended to diagnose MRSA infection nor to guide or monitor treatment for MRSA infections. Performed at [facility], [address] [place], [place] [address]   CULTURE, BLOOD (ROUTINE X 2) w Reflex to ID Panel     Status: None   Collection Time: 12/15/18  5:51 PM   Specimen: BLOOD  Result Value Ref Range Status   Specimen Description BLOOD BLOOD RIGHT HAND  Final   Special Requests   Final    BOTTLES DRAWN AEROBIC AND ANAEROBIC Blood Culture adequate volume   Culture   Final    NO GROWTH 5 DAYS Performed at [facility], [address] [place], [place] [address]    Report Status 12/20/2018 FINAL  Final  CULTURE, BLOOD (ROUTINE X 2) w Reflex to ID Panel     Status:  None   Collection Time: 12/15/18  5:51 PM   Specimen: BLOOD  Result Value Ref Range Status   Specimen Description BLOOD BLOOD RIGHT WRIST  Final   Special Requests   Final    BOTTLES DRAWN AEROBIC AND ANAEROBIC Blood Culture adequate volume   Culture   Final    NO GROWTH 5 DAYS Performed at [facility], [address], [place], [place] [address]    Report Status 12/20/2018 FINAL  Final  Aerobic/Anaerobic Culture (surgical/deep wound)     Status: None (Preliminary result)   Collection Time: 12/17/18 10:36 AM   Specimen: [facility] Other; Tissue  Result Value Ref Range Status   Specimen Description   Final    WOUND RIGHT FOOT Performed at [facility], [place] [address], [place], [place] [address]    Special Requests   Final    NONE Performed at [facility], [place], [place] [address]    Gram Stain   Final    RARE WBC PRESENT, PREDOMINANTLY PMN FEW GRAM POSITIVE COCCI IN PAIRS RARE GRAM NEGATIVE RODS Performed at [facility], [place] [address], [place], [place] [address]    Culture   Final    FEW PROTEUS MIRABILIS FEW KLEBSIELLA PNEUMONIAE FEW ENTEROCOCCUS FAECALIS SUSCEPTIBILITIES TO FOLLOW NO ANAEROBES ISOLATED; CULTURE IN PROGRESS FOR 5 DAYS    Report Status PENDING  Incomplete   Organism ID, Bacteria PROTEUS MIRABILIS  Final   Organism ID, Bacteria KLEBSIELLA PNEUMONIAE  Final      Susceptibility   Klebsiella pneumoniae - MIC*    AMPICILLIN RESISTANT Resistant     CEFAZOLIN <=4 SENSITIVE Sensitive     CEFEPIME <=1 SENSITIVE Sensitive     CEFTAZIDIME <=1 SENSITIVE Sensitive     CEFTRIAXONE <=1 SENSITIVE Sensitive     CIPROFLOXACIN <=0.25 SENSITIVE Sensitive     GENTAMICIN <=1 SENSITIVE Sensitive     IMIPENEM <=0.25  SENSITIVE Sensitive     TRIMETH/SULFA <=20 SENSITIVE Sensitive     AMPICILLIN/SULBACTAM <=2 SENSITIVE Sensitive     PIP/TAZO <=4 SENSITIVE Sensitive     Extended ESBL NEGATIVE Sensitive     * FEW KLEBSIELLA  PNEUMONIAE   Proteus mirabilis - MIC*    AMPICILLIN <=2 SENSITIVE Sensitive     CEFAZOLIN <=4 SENSITIVE Sensitive     CEFEPIME <=1 SENSITIVE Sensitive     CEFTAZIDIME <=1 SENSITIVE Sensitive     CEFTRIAXONE <=1 SENSITIVE Sensitive     CIPROFLOXACIN <=0.25 SENSITIVE Sensitive     GENTAMICIN <=1 SENSITIVE Sensitive     IMIPENEM 2 SENSITIVE Sensitive     TRIMETH/SULFA <=20 SENSITIVE Sensitive     AMPICILLIN/SULBACTAM <=2 SENSITIVE Sensitive     PIP/TAZO <=4 SENSITIVE Sensitive     * FEW PROTEUS MIRABILIS    RADIOLOGY:  No results found.   CODE STATUS:     Code Status Orders  (From admission, onward)         Start     Ordered   12/15/18 1640  Full code  Continuous     12/15/18 1639        Code Status History    Date Active Date Inactive Code Status Order ID Comments User Context   10/11/2018 2235 10/16/2018 1851 Full Code [id number]  Vaughan Basta, MD ED   10/07/2018 1042 10/07/2018 1733 Full Code [id number]  Algernon Huxley, MD Inpatient   09/30/2018 0956 09/30/2018 1441 Full Code [id number]  Algernon Huxley, MD Inpatient   08/30/2018 1224 08/30/2018 2034 Full Code [id number]  Algernon Huxley, MD Inpatient   07/23/2018 1111 07/23/2018 1517 Full Code [id number]  Samara Deist, [facility] Inpatient   06/13/2018 0135 06/17/2018 2206 Full Code [id number]  Lance Coon, MD ED   03/21/2018 1315 03/28/2018 1004 Full Code [id number]  Saundra Shelling, MD ED   Advance Care Planning Activity      TOTAL TIME TAKING CARE OF THIS PATIENT: *40* minutes.    Fritzi Mandes M.D on 12/21/2018 at 10:20 AM  Between 7am to 6pm - Pager - [phone] After 6pm go to [email or web address] - password [email or web address] [facility]  Office  [phone]  CC: Primary care physician; [name], MD

## 2018-12-21 NOTE — Progress Notes (Signed)
Report called to Inez Catalina, Therapist, sports, at [employer].  All questions answered.  EMS notified for transport; awaiting EMS.

## 2018-12-21 NOTE — Progress Notes (Signed)
[place], [place] 12/21/18  Subjective:  Patient seen and evaluated at bedside. Tolerating treatment well.   Objective:  Vital signs in last 24 hours:  Temp:  [97.6 F (36.4 C)-98.6 F (37 C)] 98 F (36.7 C) (07/21 1000) Pulse Rate:  [44-72] 57 (07/21 1145) Resp:  [10-16] 16 (07/21 1000) BP: (124-151)/(62-84) 131/64 (07/21 1145) SpO2:  [92 %-100 %] 98 % (07/21 0438) Weight:  [69.7 kg] 69.7 kg (07/21 0438)  Weight change:  Filed Weights   12/16/18 0930 12/17/18 0951 12/21/18 0438  Weight: 67.8 kg 67.8 kg 69.7 kg    Intake/Output:    Intake/Output Summary (Last 24 hours) at 12/21/2018 1201 Last data filed at 12/21/2018 1015 Gross per 24 hour  Intake 296.83 ml  Output 350 ml  Net -53.17 ml    Gen:   No acute distress Head:   Normocephalic/atraumatic Eyes/ENT:  conjunctiva/corneas clear,  moist oral mucus membranes Neck:  Supple Lungs:   Clear to auscultation bilaterally, respirations unlabored Heart:   S1S2 no rubs Abdomen:   Soft, non-tender, bowel sounds active  Extremities: no cyanosis or edema.  Right foot bandaged Skin:  Skin color, texture, turgor normal  Neurologic: Alert and oriented, able to answer questions appropriately Access:           Left forearm AVF     Basic Metabolic Panel:  Recent Labs  Lab 12/15/18 1751 12/16/18 0510 12/18/18 0429  NA 138 137  --   K 3.6 4.2  --   CL 103 103  --   CO2 26 24  --   GLUCOSE 96 131*  --   BUN 48* 51*  --   CREATININE 4.61* 4.93* 4.65*  CALCIUM 8.9 8.9  --   PHOS  --  4.4  --      CBC: Recent Labs  Lab 12/15/18 1751 12/16/18 0510 12/18/18 0429  WBC 6.9 5.3 7.2  HGB 11.4* 10.4* 10.4*  HCT 34.4* 32.9* 31.9*  MCV 100.6* 102.5* 100.0  PLT 165 133* 140*      Lab Results  Component Value Date   HEPBSAG Negative 03/22/2018   HEPBSAB Reactive 03/22/2018   HEPBIGM Negative 03/22/2018      Microbiology:  Recent Results (from the past 240 hour(s))  SARS  Coronavirus 2 (CEPHEID - Performed in [place] hospital lab), Hosp Order     Status: None   Collection Time: 12/15/18  4:53 PM   Specimen: Nasopharyngeal Swab  Result Value Ref Range Status   SARS Coronavirus 2 NEGATIVE NEGATIVE Final    Comment: (NOTE) If result is NEGATIVE SARS-CoV-2 target nucleic acids are NOT DETECTED. The SARS-CoV-2 RNA is generally detectable in upper and lower  respiratory specimens during the acute phase of infection. The lowest  concentration of SARS-CoV-2 viral copies this assay can detect is 250  copies / mL. A negative result does not preclude SARS-CoV-2 infection  and should not be used as the sole basis for treatment or other  patient management decisions.  A negative result may occur with  improper specimen collection / handling, submission of specimen other  than nasopharyngeal swab, presence of viral mutation(s) within the  areas targeted by this assay, and inadequate number of viral copies  (<250 copies / mL). A negative result must be combined with clinical  observations, patient history, and epidemiological information. If result is POSITIVE SARS-CoV-2 target nucleic acids are DETECTED. The SARS-CoV-2 RNA is generally detectable in upper and lower  respiratory specimens dur ing  the acute phase of infection.  Positive  results are indicative of active infection with SARS-CoV-2.  Clinical  correlation with patient history and other diagnostic information is  necessary to determine patient infection status.  Positive results do  not rule out bacterial infection or co-infection with other viruses. If result is PRESUMPTIVE POSTIVE SARS-CoV-2 nucleic acids MAY BE PRESENT.   A presumptive positive result was obtained on the submitted specimen  and confirmed on repeat testing.  While 2019 novel coronavirus  (SARS-CoV-2) nucleic acids may be present in the submitted sample  additional confirmatory testing may be necessary for epidemiological  and / or  clinical management purposes  to differentiate between  SARS-CoV-2 and other Sarbecovirus currently known to infect humans.  If clinically indicated additional testing with an alternate test  methodology [phone]) is advised. The SARS-CoV-2 RNA is generally  detectable in upper and lower respiratory sp ecimens during the acute  phase of infection. The expected result is Negative. Fact Sheet for Patients:  StrictlyIdeas.no Fact Sheet for Healthcare Providers: BankingDealers.co.za This test is not yet approved or cleared by the [place] FDA and has been authorized for detection and/or diagnosis of SARS-CoV-2 by FDA under an Emergency Use Authorization (EUA).  This EUA will remain in effect (meaning this test can be used) for the duration of the COVID-19 declaration under Section 564(b)(1) of the Act, 21 U.S.C. section 360bbb-3(b)(1), unless the authorization is terminated or revoked sooner. Performed at [facility], [address] [place], [place] [address]   MRSA PCR Screening     Status: None   Collection Time: 12/15/18  4:53 PM   Specimen: Nasal Mucosa; Nasopharyngeal  Result Value Ref Range Status   MRSA by PCR NEGATIVE NEGATIVE Final    Comment:        The GeneXpert MRSA Assay (FDA approved for NASAL specimens only), is one component of a comprehensive MRSA colonization surveillance program. It is not intended to diagnose MRSA infection nor to guide or monitor treatment for MRSA infections. Performed at [facility], [address] [place], [place] [address]   CULTURE, BLOOD (ROUTINE X 2) w Reflex to ID Panel     Status: None   Collection Time: 12/15/18  5:51 PM   Specimen: BLOOD  Result Value Ref Range Status   Specimen Description BLOOD BLOOD RIGHT HAND  Final   Special Requests   Final    BOTTLES DRAWN AEROBIC AND ANAEROBIC Blood Culture adequate volume   Culture   Final    NO GROWTH 5  DAYS Performed at [facility], [address] [place], [place] [address]    Report Status 12/20/2018 FINAL  Final  CULTURE, BLOOD (ROUTINE X 2) w Reflex to ID Panel     Status: None   Collection Time: 12/15/18  5:51 PM   Specimen: BLOOD  Result Value Ref Range Status   Specimen Description BLOOD BLOOD RIGHT WRIST  Final   Special Requests   Final    BOTTLES DRAWN AEROBIC AND ANAEROBIC Blood Culture adequate volume   Culture   Final    NO GROWTH 5 DAYS Performed at [facility], [address], [place], [place] [address]    Report Status 12/20/2018 FINAL  Final  Aerobic/Anaerobic Culture (surgical/deep wound)     Status: None (Preliminary result)   Collection Time: 12/17/18 10:36 AM   Specimen: [facility] Other; Tissue  Result Value Ref Range Status   Specimen Description   Final    WOUND  RIGHT FOOT Performed at [facility], [place] [address], [place], [place] [address]    Special Requests   Final    NONE Performed at [facility], [place], [place] [address]    Gram Stain   Final    RARE WBC PRESENT, PREDOMINANTLY PMN FEW GRAM POSITIVE COCCI IN PAIRS RARE GRAM NEGATIVE RODS Performed at [facility], [place] [address] [place], [place] [address]    Culture   Final    FEW PROTEUS MIRABILIS FEW KLEBSIELLA PNEUMONIAE FEW ENTEROCOCCUS FAECALIS NO ANAEROBES ISOLATED; CULTURE IN PROGRESS FOR 5 DAYS    Report Status PENDING  Incomplete   Organism ID, Bacteria PROTEUS MIRABILIS  Final   Organism ID, Bacteria KLEBSIELLA PNEUMONIAE  Final   Organism ID, Bacteria ENTEROCOCCUS FAECALIS  Final      Susceptibility   Enterococcus faecalis - MIC*    AMPICILLIN <=2 SENSITIVE Sensitive     VANCOMYCIN 1 SENSITIVE Sensitive     GENTAMICIN SYNERGY SENSITIVE Sensitive     * FEW ENTEROCOCCUS FAECALIS   Klebsiella pneumoniae - MIC*    AMPICILLIN RESISTANT Resistant     CEFAZOLIN <=4 SENSITIVE Sensitive     CEFEPIME <=1 SENSITIVE Sensitive      CEFTAZIDIME <=1 SENSITIVE Sensitive     CEFTRIAXONE <=1 SENSITIVE Sensitive     CIPROFLOXACIN <=0.25 SENSITIVE Sensitive     GENTAMICIN <=1 SENSITIVE Sensitive     IMIPENEM <=0.25 SENSITIVE Sensitive     TRIMETH/SULFA <=20 SENSITIVE Sensitive     AMPICILLIN/SULBACTAM <=2 SENSITIVE Sensitive     PIP/TAZO <=4 SENSITIVE Sensitive     Extended ESBL NEGATIVE Sensitive     * FEW KLEBSIELLA PNEUMONIAE   Proteus mirabilis - MIC*    AMPICILLIN <=2 SENSITIVE Sensitive     CEFAZOLIN <=4 SENSITIVE Sensitive     CEFEPIME <=1 SENSITIVE Sensitive     CEFTAZIDIME <=1 SENSITIVE Sensitive     CEFTRIAXONE <=1 SENSITIVE Sensitive     CIPROFLOXACIN <=0.25 SENSITIVE Sensitive     GENTAMICIN <=1 SENSITIVE Sensitive     IMIPENEM 2 SENSITIVE Sensitive     TRIMETH/SULFA <=20 SENSITIVE Sensitive     AMPICILLIN/SULBACTAM <=2 SENSITIVE Sensitive     PIP/TAZO <=4 SENSITIVE Sensitive     * FEW PROTEUS MIRABILIS    Coagulation Studies: No results for input(s): LABPROT, INR in the last 72 hours.  Urinalysis: No results for input(s): COLORURINE, LABSPEC, PHURINE, GLUCOSEU, HGBUR, BILIRUBINUR, KETONESUR, PROTEINUR, UROBILINOGEN, NITRITE, LEUKOCYTESUR in the last 72 hours.  Invalid input(s): APPERANCEUR    Imaging: No results found.   Medications:   . sodium chloride Stopped (12/20/18 0026)   . allopurinol  100 mg Oral Daily  . amLODipine  10 mg Oral Daily  . atorvastatin  10 mg Oral q1800  . calcium acetate  1,334 mg Oral TID WC  . cefdinir  300 mg Oral QODAY  . Chlorhexidine Gluconate Cloth  6 each Topical Q0600  . cloNIDine  0.2 mg Oral BID  . epoetin (EPOGEN/PROCRIT) injection  4,000 Units Intravenous Q T,Th,Sa-HD  . escitalopram  10 mg Oral Daily  . feeding supplement (NEPRO CARB STEADY)  237 mL Oral BID BM  . gabapentin  300 mg Oral QHS  . heparin injection (subcutaneous)  5,000 Units Subcutaneous Q8H  . hydrALAZINE  25 mg Oral Q8H  . multivitamin  1 tablet Oral QHS  . nicotine  21 mg  Transdermal Daily  . pneumococcal 23 valent vaccine  0.5 mL Intramuscular  Tomorrow-1000  . sodium chloride flush  3 mL Intravenous Q12H   sodium chloride, acetaminophen **OR** acetaminophen, HYDROcodone-acetaminophen, morphine injection, ondansetron **OR** ondansetron (ZOFRAN) IV, ondansetron (ZOFRAN) IV, senna-docusate, sodium chloride flush  Assessment/ Plan:  73 y.o. African-American male with diabetes, end-stage renal disease, history of prostate cancer, history of stroke, peripheral vascular disease, history of bilateral transmetatarsal amputation  Active Problems:   Foot ulcer ([place])  [facility]  TuThSa-2 TW 65 kg  #.  End-stage renal disease.  Hemodialysis TTS schedule Recent Labs    12/15/18 1751 12/16/18 0510 12/18/18 0429  CREATININE 4.61* 4.93* 4.65*  Patient seen and evaluated during dialysis treatment today.  Tolerating well.  We plan to complete dialysis treatment today.  #. Anemia of CKD  Lab Results  Component Value Date   HGB 10.4 (L) 12/18/2018  Continue Epogen 4000 units IV with dialysis.  #. SHPTH    Component Value Date/Time   PTH 226 (H) 03/25/2018 1557   Lab Results  Component Value Date   PHOS 4.4 12/16/2018  Repeat serum phosphorus today and otherwise continue calcium acetate.  #. HTN with CKD Continue amlodipine, clonidine, hydralazine  #. Diabetes type 2 with CKD Hgb A1c MFr Bld (%)  Date Value  12/15/2018 5.6   # Right Foot infection -Patient on treatment with cefdinir.  Underwent surgical debridement on December 17, 2018.   LOS: 6 Fallon Haecker 7/21/202012:01 PM  [facility], [place]

## 2018-12-21 NOTE — TOC Transition Note (Addendum)
Transition of Care [facility]) - CM/SW Discharge Note   Patient Details  Name: Jose [name] MRN: [id number] Date of Birth: 21-Jan-1946  [facility] CM/SW Contact:  [name], RN Phone Number: 12/21/2018, 2:12 PM   Clinical Narrative:    Patient to discharge today Patient has now decided that he wishes to go to SNF at discharge  Bed search initiated. Patient states that if he is able to go to [facility] that is his preference.   [facility] was able to offer a bed. Patient accepted.  Discharge report sent in [place].   Patient to have repeat covid test once he returns to the floor from HD.  After negative result is obtained. Patient can discharge  Patient will go by EMS.  RNCM to place completed packet on chart   Voicemail left for son Derrell Lolling covid negative.  EMS packet on chart. Bedside RN notified    Final next level of care: Skilled Nursing Facility Barriers to Discharge: Barriers Resolved   Patient Goals and CMS Choice        Discharge Placement              Patient chooses bed at: [facility] [place] Patient to be transferred to facility by: EMS      Discharge Plan and Services   Discharge Planning Services: CM Consult                                 Social Determinants of Health (SDOH) Interventions     Readmission Risk Interventions Readmission Risk Prevention Plan 12/20/2018 10/15/2018 10/14/2018  Transportation Screening Complete Complete Complete  PCP or Specialist Appt within 3-5 Days - (No Data) -  [place] or Home Care Consult Complete - Complete  Social Work Consult for [place] Planning/Counseling - - Not Complete  Palliative Care Screening - - Not Applicable  Medication Review Press photographer) Complete - Complete  Some recent data might be hidden

## 2018-12-24 LAB — AEROBIC/ANAEROBIC CULTURE W GRAM STAIN (SURGICAL/DEEP WOUND)

## 2019-01-24 ENCOUNTER — Encounter (INDEPENDENT_AMBULATORY_CARE_PROVIDER_SITE_OTHER): Payer: Medicare Other

## 2019-01-24 ENCOUNTER — Ambulatory Visit (INDEPENDENT_AMBULATORY_CARE_PROVIDER_SITE_OTHER): Payer: Medicare Other | Admitting: Nurse Practitioner

## 2019-02-08 ENCOUNTER — Encounter (INDEPENDENT_AMBULATORY_CARE_PROVIDER_SITE_OTHER): Payer: Medicare Other

## 2019-02-08 ENCOUNTER — Ambulatory Visit (INDEPENDENT_AMBULATORY_CARE_PROVIDER_SITE_OTHER): Payer: Medicare Other | Admitting: Nurse Practitioner

## 2019-03-30 ENCOUNTER — Encounter (INDEPENDENT_AMBULATORY_CARE_PROVIDER_SITE_OTHER): Payer: Self-pay

## 2019-10-22 ENCOUNTER — Emergency Department: Payer: Medicare Other

## 2019-10-22 ENCOUNTER — Other Ambulatory Visit: Payer: Self-pay

## 2019-10-22 ENCOUNTER — Inpatient Hospital Stay
Admission: EM | Admit: 2019-10-22 | Discharge: 2019-10-24 | DRG: 291 | Disposition: A | Discharge status: Home / Self Care | Payer: Medicare Other | Attending: Internal Medicine | Admitting: Internal Medicine

## 2019-10-22 ENCOUNTER — Encounter: Payer: Self-pay | Admitting: Emergency Medicine

## 2019-10-22 DIAGNOSIS — I639 Cerebral infarction, unspecified: Secondary | ICD-10-CM

## 2019-10-22 DIAGNOSIS — Z720 Tobacco use: Secondary | ICD-10-CM | POA: Diagnosis present

## 2019-10-22 DIAGNOSIS — Z89431 Acquired absence of right foot: Secondary | ICD-10-CM

## 2019-10-22 DIAGNOSIS — Z8546 Personal history of malignant neoplasm of prostate: Secondary | ICD-10-CM

## 2019-10-22 DIAGNOSIS — Z20822 Contact with and (suspected) exposure to covid-19: Secondary | ICD-10-CM | POA: Diagnosis present

## 2019-10-22 DIAGNOSIS — F17210 Nicotine dependence, cigarettes, uncomplicated: Secondary | ICD-10-CM | POA: Diagnosis present

## 2019-10-22 DIAGNOSIS — Z992 Dependence on renal dialysis: Secondary | ICD-10-CM

## 2019-10-22 DIAGNOSIS — Z8673 Personal history of transient ischemic attack (TIA), and cerebral infarction without residual deficits: Secondary | ICD-10-CM

## 2019-10-22 DIAGNOSIS — R0602 Shortness of breath: Secondary | ICD-10-CM | POA: Diagnosis not present

## 2019-10-22 DIAGNOSIS — D631 Anemia in chronic kidney disease: Secondary | ICD-10-CM | POA: Diagnosis present

## 2019-10-22 DIAGNOSIS — E785 Hyperlipidemia, unspecified: Secondary | ICD-10-CM | POA: Diagnosis present

## 2019-10-22 DIAGNOSIS — E1151 Type 2 diabetes mellitus with diabetic peripheral angiopathy without gangrene: Secondary | ICD-10-CM | POA: Diagnosis present

## 2019-10-22 DIAGNOSIS — J811 Chronic pulmonary edema: Secondary | ICD-10-CM | POA: Diagnosis not present

## 2019-10-22 DIAGNOSIS — R338 Other retention of urine: Secondary | ICD-10-CM | POA: Diagnosis not present

## 2019-10-22 DIAGNOSIS — Z7984 Long term (current) use of oral hypoglycemic drugs: Secondary | ICD-10-CM

## 2019-10-22 DIAGNOSIS — I5033 Acute on chronic diastolic (congestive) heart failure: Secondary | ICD-10-CM | POA: Diagnosis present

## 2019-10-22 DIAGNOSIS — E1122 Type 2 diabetes mellitus with diabetic chronic kidney disease: Secondary | ICD-10-CM | POA: Diagnosis present

## 2019-10-22 DIAGNOSIS — Z89432 Acquired absence of left foot: Secondary | ICD-10-CM

## 2019-10-22 DIAGNOSIS — J90 Pleural effusion, not elsewhere classified: Secondary | ICD-10-CM | POA: Diagnosis not present

## 2019-10-22 DIAGNOSIS — N2581 Secondary hyperparathyroidism of renal origin: Secondary | ICD-10-CM | POA: Diagnosis present

## 2019-10-22 DIAGNOSIS — I132 Hypertensive heart and chronic kidney disease with heart failure and with stage 5 chronic kidney disease, or end stage renal disease: Secondary | ICD-10-CM | POA: Diagnosis not present

## 2019-10-22 DIAGNOSIS — N186 End stage renal disease: Secondary | ICD-10-CM | POA: Diagnosis present

## 2019-10-22 DIAGNOSIS — Z7982 Long term (current) use of aspirin: Secondary | ICD-10-CM

## 2019-10-22 DIAGNOSIS — R197 Diarrhea, unspecified: Secondary | ICD-10-CM | POA: Diagnosis present

## 2019-10-22 DIAGNOSIS — N39490 Overflow incontinence: Secondary | ICD-10-CM | POA: Diagnosis present

## 2019-10-22 DIAGNOSIS — F329 Major depressive disorder, single episode, unspecified: Secondary | ICD-10-CM | POA: Diagnosis present

## 2019-10-22 DIAGNOSIS — I10 Essential (primary) hypertension: Secondary | ICD-10-CM

## 2019-10-22 DIAGNOSIS — Z7902 Long term (current) use of antithrombotics/antiplatelets: Secondary | ICD-10-CM

## 2019-10-22 DIAGNOSIS — F32A Depression, unspecified: Secondary | ICD-10-CM | POA: Diagnosis present

## 2019-10-22 DIAGNOSIS — E1129 Type 2 diabetes mellitus with other diabetic kidney complication: Secondary | ICD-10-CM | POA: Diagnosis present

## 2019-10-22 DIAGNOSIS — E876 Hypokalemia: Secondary | ICD-10-CM | POA: Diagnosis present

## 2019-10-22 DIAGNOSIS — Z79899 Other long term (current) drug therapy: Secondary | ICD-10-CM

## 2019-10-22 DIAGNOSIS — N401 Enlarged prostate with lower urinary tract symptoms: Secondary | ICD-10-CM | POA: Diagnosis present

## 2019-10-22 DIAGNOSIS — F419 Anxiety disorder, unspecified: Secondary | ICD-10-CM | POA: Diagnosis present

## 2019-10-22 LAB — URINALYSIS, COMPLETE (UACMP) WITH MICROSCOPIC
Bacteria, UA: NONE SEEN
Bilirubin Urine: NEGATIVE
Glucose, UA: NEGATIVE mg/dL
Ketones, ur: NEGATIVE mg/dL
Nitrite: NEGATIVE
Protein, ur: 100 mg/dL — AB
Specific Gravity, Urine: 1.014 (ref 1.005–1.030)
Squamous Epithelial / HPF: NONE SEEN (ref 0–5)
WBC, UA: NONE SEEN WBC/hpf (ref 0–5)
pH: 6 (ref 5.0–8.0)

## 2019-10-22 LAB — BASIC METABOLIC PANEL
Anion gap: 15 (ref 5–15)
BUN: 49 mg/dL — ABNORMAL HIGH (ref 8–23)
CO2: 26 mmol/L (ref 22–32)
Calcium: 8.3 mg/dL — ABNORMAL LOW (ref 8.9–10.3)
Chloride: 100 mmol/L (ref 98–111)
Creatinine, Ser: 6.95 mg/dL — ABNORMAL HIGH (ref 0.61–1.24)
GFR calc Af Amer: 8 mL/min — ABNORMAL LOW (ref >60)
GFR calc non Af Amer: 7 mL/min — ABNORMAL LOW (ref >60)
Glucose, Bld: 92 mg/dL (ref 70–99)
Potassium: 3.2 mmol/L — ABNORMAL LOW (ref 3.5–5.1)
Sodium: 141 mmol/L (ref 135–145)

## 2019-10-22 LAB — CBC WITH DIFFERENTIAL/PLATELET
Abs Immature Granulocytes: 0.05 10*3/uL (ref 0.00–0.07)
Basophils Absolute: 0.1 10*3/uL (ref 0.0–0.1)
Basophils Relative: 1 %
Eosinophils Absolute: 0.2 10*3/uL (ref 0.0–0.5)
Eosinophils Relative: 2 %
HCT: 39.5 % (ref 39.0–52.0)
Hemoglobin: 13.3 g/dL (ref 13.0–17.0)
Immature Granulocytes: 1 %
Lymphocytes Relative: 19 %
Lymphs Abs: 2.1 10*3/uL (ref 0.7–4.0)
MCH: 33.3 pg (ref 26.0–34.0)
MCHC: 33.7 g/dL (ref 30.0–36.0)
MCV: 98.8 fL (ref 80.0–100.0)
Monocytes Absolute: 1.1 10*3/uL — ABNORMAL HIGH (ref 0.1–1.0)
Monocytes Relative: 10 %
Neutro Abs: 7.4 10*3/uL (ref 1.7–7.7)
Neutrophils Relative %: 67 %
Platelets: 168 10*3/uL (ref 150–400)
RBC: 4 MIL/uL — ABNORMAL LOW (ref 4.22–5.81)
RDW: 15.7 % — ABNORMAL HIGH (ref 11.5–15.5)
WBC: 10.9 10*3/uL — ABNORMAL HIGH (ref 4.0–10.5)
nRBC: 0 % (ref 0.0–0.2)

## 2019-10-22 LAB — SARS CORONAVIRUS 2 BY RT PCR (HOSPITAL ORDER, PERFORMED IN ~~LOC~~ HOSPITAL LAB): SARS Coronavirus 2: NEGATIVE

## 2019-10-22 LAB — GLUCOSE, CAPILLARY: Glucose-Capillary: 94 mg/dL (ref 70–99)

## 2019-10-22 MED ORDER — HYDRALAZINE HCL 20 MG/ML IJ SOLN: 5.0000 mg | INTRAMUSCULAR | Status: DC | PRN

## 2019-10-22 MED ORDER — DM-GUAIFENESIN ER 30-600 MG PO TB12
1.0000 | ORAL_TABLET | Freq: Two times a day (BID) | ORAL | Status: DC
  Administered 2019-10-22 – 2019-10-24 (×4): 1 via ORAL
  Filled 2019-10-22 (×4): qty 1

## 2019-10-22 MED ORDER — CLOPIDOGREL BISULFATE 75 MG PO TABS
75.0000 mg | ORAL_TABLET | Freq: Every day | ORAL | Status: DC
  Administered 2019-10-23 – 2019-10-24 (×2): 75 mg via ORAL
  Filled 2019-10-22 (×2): qty 1

## 2019-10-22 MED ORDER — INSULIN ASPART 100 UNIT/ML ~~LOC~~ SOLN: 0.0000 [IU] | Freq: Every day | SUBCUTANEOUS | Status: DC

## 2019-10-22 MED ORDER — ALLOPURINOL 100 MG PO TABS
100.0000 mg | ORAL_TABLET | Freq: Every day | ORAL | Status: DC
  Administered 2019-10-23 – 2019-10-24 (×2): 100 mg via ORAL
  Filled 2019-10-22 (×2): qty 1

## 2019-10-22 MED ORDER — HEPARIN SODIUM (PORCINE) 5000 UNIT/ML IJ SOLN
5000.0000 [IU] | Freq: Three times a day (TID) | INTRAMUSCULAR | Status: DC
  Administered 2019-10-23 – 2019-10-24 (×4): 5000 [IU] via SUBCUTANEOUS
  Filled 2019-10-22 (×4): qty 1

## 2019-10-22 MED ORDER — PENTAFLUOROPROP-TETRAFLUOROETH EX AERO
1.0000 "application " | INHALATION_SPRAY | CUTANEOUS | Status: DC | PRN
  Filled 2019-10-22: qty 30

## 2019-10-22 MED ORDER — CHLORHEXIDINE GLUCONATE CLOTH 2 % EX PADS
6.0000 | MEDICATED_PAD | Freq: Every day | CUTANEOUS | Status: DC
  Administered 2019-10-23: 6 via TOPICAL
  Filled 2019-10-22: qty 6

## 2019-10-22 MED ORDER — ALBUTEROL SULFATE (2.5 MG/3ML) 0.083% IN NEBU: 3.0000 mL | INHALATION_SOLUTION | RESPIRATORY_TRACT | Status: DC | PRN

## 2019-10-22 MED ORDER — LIDOCAINE HCL (PF) 1 % IJ SOLN: 5.0000 mL | INTRAMUSCULAR | Status: DC | PRN

## 2019-10-22 MED ORDER — ASPIRIN EC 81 MG PO TBEC
81.0000 mg | DELAYED_RELEASE_TABLET | Freq: Every day | ORAL | Status: DC
  Administered 2019-10-23 – 2019-10-24 (×2): 81 mg via ORAL
  Filled 2019-10-22 (×2): qty 1

## 2019-10-22 MED ORDER — ONDANSETRON HCL 4 MG/2ML IJ SOLN: 4.0000 mg | Freq: Three times a day (TID) | INTRAMUSCULAR | Status: DC | PRN

## 2019-10-22 MED ORDER — NICOTINE 21 MG/24HR TD PT24
21.0000 mg | MEDICATED_PATCH | Freq: Every day | TRANSDERMAL | Status: DC
  Administered 2019-10-22 – 2019-10-24 (×3): 21 mg via TRANSDERMAL
  Filled 2019-10-22 (×3): qty 1

## 2019-10-22 MED ORDER — ALTEPLASE 2 MG IJ SOLR
2.0000 mg | Freq: Once | INTRAMUSCULAR | Status: DC | PRN
  Filled 2019-10-22: qty 2

## 2019-10-22 MED ORDER — LIDOCAINE-PRILOCAINE 2.5-2.5 % EX CREA: 1.0000 "application " | TOPICAL_CREAM | CUTANEOUS | Status: DC | PRN

## 2019-10-22 MED ORDER — SODIUM CHLORIDE 0.9 % IV SOLN: 100.0000 mL | INTRAVENOUS | Status: DC | PRN

## 2019-10-22 MED ORDER — ATORVASTATIN CALCIUM 10 MG PO TABS
10.0000 mg | ORAL_TABLET | Freq: Every day | ORAL | Status: DC
  Administered 2019-10-23: 10 mg via ORAL
  Filled 2019-10-22: qty 1

## 2019-10-22 MED ORDER — TRAMADOL HCL 50 MG PO TABS: 50.0000 mg | ORAL_TABLET | Freq: Four times a day (QID) | ORAL | Status: DC | PRN

## 2019-10-22 MED ORDER — ACETAMINOPHEN 325 MG PO TABS: 650.0000 mg | ORAL_TABLET | Freq: Four times a day (QID) | ORAL | Status: DC | PRN

## 2019-10-22 MED ORDER — HEPARIN SODIUM (PORCINE) 1000 UNIT/ML DIALYSIS
1000.0000 [IU] | INTRAMUSCULAR | Status: DC | PRN
  Filled 2019-10-22: qty 1

## 2019-10-22 MED ORDER — CALCIUM ACETATE (PHOS BINDER) 667 MG PO CAPS
1334.0000 mg | ORAL_CAPSULE | Freq: Three times a day (TID) | ORAL | Status: DC
  Administered 2019-10-23 – 2019-10-24 (×4): 1334 mg via ORAL
  Filled 2019-10-22 (×5): qty 2

## 2019-10-22 MED ORDER — INSULIN ASPART 100 UNIT/ML ~~LOC~~ SOLN
0.0000 [IU] | Freq: Three times a day (TID) | SUBCUTANEOUS | Status: DC
  Administered 2019-10-23 – 2019-10-24 (×2): 2 [IU] via SUBCUTANEOUS
  Filled 2019-10-22: qty 1

## 2019-10-22 MED ORDER — CLONIDINE HCL 0.1 MG PO TABS
0.2000 mg | ORAL_TABLET | Freq: Two times a day (BID) | ORAL | Status: DC
  Administered 2019-10-23 – 2019-10-24 (×3): 0.2 mg via ORAL
  Filled 2019-10-22 (×3): qty 2

## 2019-10-22 MED ORDER — HEPARIN SODIUM (PORCINE) 1000 UNIT/ML DIALYSIS
20.0000 [IU]/kg | INTRAMUSCULAR | Status: DC | PRN
  Filled 2019-10-22: qty 2

## 2019-10-22 MED ORDER — HYDRALAZINE HCL 25 MG PO TABS
25.0000 mg | ORAL_TABLET | Freq: Three times a day (TID) | ORAL | Status: DC
  Administered 2019-10-23 – 2019-10-24 (×5): 25 mg via ORAL
  Filled 2019-10-22 (×5): qty 1

## 2019-10-22 MED ORDER — AMLODIPINE BESYLATE 10 MG PO TABS
10.0000 mg | ORAL_TABLET | Freq: Every day | ORAL | Status: DC
  Administered 2019-10-23 – 2019-10-24 (×2): 10 mg via ORAL
  Filled 2019-10-22 (×2): qty 1

## 2019-10-22 MED ORDER — ESCITALOPRAM OXALATE 10 MG PO TABS
10.0000 mg | ORAL_TABLET | Freq: Every day | ORAL | Status: DC
  Administered 2019-10-23 – 2019-10-24 (×2): 10 mg via ORAL
  Filled 2019-10-22 (×2): qty 1

## 2019-10-22 NOTE — ED Notes (Signed)
Report given to [facility]

## 2019-10-22 NOTE — ED Notes (Signed)
Provided pt w/ phone to talk to son.

## 2019-10-22 NOTE — H&P (Signed)
History and Physical    Jose [name] CBS:[id number] DOB: 07/06/1945 DOA: 10/22/2019  Referring MD/NP/PA:   PCP: [name], MD   Patient coming from:  The patient is coming from home.  At baseline, pt is independent for most of ADL.        Chief Complaint: Cough, shortness of breath, polyuria  HPI: Jose [name] is a 74 y.o. male with medical history significant of ESRD-HD (TTS), hypertension, hyperlipidemia, diabetes mellitus, stroke, gout, depression, prostate cancer, tobacco abuse, dCHF, who presents with polyuria, cough, shortness breath.  Patient states that he has been having cough, shortness of breath, chest congestion for about 4 days.  He coughs up greenish colored mucus.  Patient does not have chest pain.  No fever or chills.  He also reports polyuria.  He states that he feels not very frequently.  No dysuria or burning on urination.  Patient states that he has been having diarrhea in the past 2 days.  He has had 3-4 times of watery diarrhea today.  No nausea, vomiting, abdominal pain.  No unilateral weakness.  Patient's last dialysis was on Thursday.  ED Course: pt was found to have WBC 10.7, pending COVID-19 PCR, urinalysis (turbid appearance, large amount of leukocyte, negative bacteria, negative WBC), potassium 3.2, bicarbonate of 26, creatinine 6.95, BUN 49, temperature normal, blood pressure 170/80, heart rate 74, RR 16, oxygen saturation 98% on room air. CXR showed new moderate-sized left pleural effusion with associated left basilar atelectasis and possible mild pulmonary edema. Pelvic US showed significant post void residual indicating urinary retention and prostate volume estimated 15 cc. Pt is placed on MedSurg bed for observation. Dr. Theador Hawthorne of renal is consulted.   Review of Systems:   General: no fevers, chills, no body weight gain, has fatigue HEENT: no blurry vision, hearing changes or sore throat Respiratory: has dyspnea, coughing, no wheezing CV: no chest  pain, no palpitations GI: no nausea, vomiting, abdominal pain, diarrhea, constipation GU: no dysuria, burning on urination, has increased urinary frequency, no hematuria  Ext: has trace leg edema Neuro: no unilateral weakness, numbness, or tingling, no vision change or hearing loss Skin: no rash, no skin tear. MSK: No muscle spasm, no deformity, no limitation of range of movement in spin Heme: No easy bruising.  Travel history: No recent long distant travel.  Allergy:  Allergies  Allergen Reactions  . Penicillin G Other (See Comments)    Other reaction(s): Unknown DID THE REACTION INVOLVE: Swelling of the face/tongue/throat, SOB, or low BP? Unknown Sudden or severe rash/hives, skin peeling, or the inside of the mouth or nose? Unknown Did it require medical treatment? Unknown When did it last happen?unknown If all above answers are "NO", may proceed with cephalosporin use.     Past Medical History:  Diagnosis Date  . Chronic kidney disease   . Depression   . Diabetes mellitus without complication ([place])   . Glaucoma   . Glaucoma   . Gout   . Heart murmur   . HLD (hyperlipidemia)   . Hypertension   . Prostate cancer ([place])   . Renal failure    dialysis t/t/s  . Stroke ([place])    tia x 2    Past Surgical History:  Procedure Laterality Date  . AMPUTATION TOE Bilateral 07/23/2018   Procedure: TOE MPJ RIGHT 3RD AND LEFT 2ND;  Surgeon: Samara Deist, DPM;  Location: [facility];  Service: Podiatry;  Laterality: Bilateral;  . APPENDECTOMY    . AV FISTULA PLACEMENT  Left 06/09/2018   Procedure: ARTERIOVENOUS (AV) FISTULA CREATION ( BRACHIAL CEPHALIC);  Surgeon: Algernon Huxley, MD;  Location: [facility];  Service: Vascular;  Laterality: Left;  . CHOLECYSTECTOMY    . DIALYSIS/PERMA CATHETER INSERTION N/A 03/24/2018   Procedure: DIALYSIS/PERMA CATHETER INSERTION;  Surgeon: Katha Cabal, MD;  Location: [place] [facility];  Service: Cardiovascular;  Laterality: N/A;  . DIALYSIS/PERMA  CATHETER REMOVAL N/A 09/06/2018   Procedure: DIALYSIS/PERMA CATHETER REMOVAL;  Surgeon: Algernon Huxley, MD;  Location: [place] [facility];  Service: Cardiovascular;  Laterality: N/A;  . INSERTION PROSTATE RADIATION SEED    . IRRIGATION AND DEBRIDEMENT FOOT Right 12/17/2018   Procedure: IRRIGATION AND DEBRIDEMENT FOOT;  Surgeon: Samara Deist, DPM;  Location: [facility];  Service: Podiatry;  Laterality: Right;  . LOWER EXTREMITY ANGIOGRAPHY Right 06/16/2018   Procedure: LOWER EXTREMITY ANGIOGRAPHY;  Surgeon: Algernon Huxley, MD;  Location: [place] [facility];  Service: Cardiovascular;  Laterality: Right;  . LOWER EXTREMITY ANGIOGRAPHY Left 08/30/2018   Procedure: LOWER EXTREMITY ANGIOGRAPHY;  Surgeon: Algernon Huxley, MD;  Location: [place] [facility];  Service: Cardiovascular;  Laterality: Left;  . LOWER EXTREMITY ANGIOGRAPHY Right 09/30/2018   Procedure: LOWER EXTREMITY ANGIOGRAPHY;  Surgeon: Algernon Huxley, MD;  Location: [place] [facility];  Service: Cardiovascular;  Laterality: Right;  . LOWER EXTREMITY ANGIOGRAPHY Left 10/07/2018   Procedure: LOWER EXTREMITY ANGIOGRAPHY;  Surgeon: Algernon Huxley, MD;  Location: [place] [facility];  Service: Cardiovascular;  Laterality: Left;  . LOWER EXTREMITY ANGIOGRAPHY Right 12/20/2018   Procedure: Lower Extremity Angiography;  Surgeon: Algernon Huxley, MD;  Location: [place] [facility];  Service: Cardiovascular;  Laterality: Right;  . TRANSMETATARSAL AMPUTATION Bilateral 10/13/2018   Procedure: 1.  Amputation right great toe MTPJ 2.  Amputation right second toe MTPJ 3.  Amputation right fourth toe MTPJ 4.  Amputation right fifth toe MTPJ 5.  Excision distal second metatarsal head right foot 6.  Excision distal third metatarsal head right foot 7.  Amputation left third toe 8.  Incision and drainage infectionleft 2nd toe amputation site. ;  Surgeon: Samara Deist, DPM;  Location    Social History:  reports that he has been smoking cigarettes. He has been smoking about  0.10 packs per day. He has never used smokeless tobacco. He reports previous alcohol use. He reports that he does not use drugs.  Family History:  Family History  Problem Relation Age of Onset  . Varicose Veins Neg Hx      Prior to Admission medications   Medication Sig Start Date End Date Taking? Authorizing Provider  allopurinol (ZYLOPRIM) 100 MG tablet Take 100 mg by mouth daily.     [provider]  amLODipine (NORVASC) 10 MG tablet Take 10 mg by mouth daily.     [provider]  aspirin EC 81 MG EC tablet Take 1 tablet (81 mg total) by mouth daily. 06/18/18   Dustin Flock, MD  atorvastatin (LIPITOR) 10 MG tablet Take 1 tablet (10 mg total) by mouth daily at 6 PM. 08/30/18   Stegmayer, Janalyn Harder, PA-C  calcium acetate (PHOSLO) 667 MG capsule Take 2 capsules (1,334 mg total) by mouth 3 (three) times daily with meals. 12/21/18   Fritzi Mandes, MD  cloNIDine (CATAPRES) 0.2 MG tablet TAKE 1 TABLET BY MOUTH TWICE A DAY Patient taking differently: Take 0.2 mg by mouth 2 (two) times daily.  05/14/18   Algernon Huxley, MD  clopidogrel (PLAVIX) 75 MG tablet  Take 1 tablet (75 mg total) by mouth daily. 06/18/18   Dustin Flock, MD  docusate sodium (COLACE) 100 MG capsule Take 1 capsule (100 mg total) by mouth 2 (two) times daily as needed for mild constipation. 10/15/18   Vaughan Basta, MD  escitalopram (LEXAPRO) 10 MG tablet Take 10 mg by mouth daily.     [provider]  gabapentin (NEURONTIN) 300 MG capsule TAKE 1 CAPSULE BY MOUTH THREE TIMES A DAY 11/08/18   Kris Hartmann, NP  glipiZIDE (GLUCOTROL) 5 MG tablet Take 5 mg by mouth daily before breakfast.     [provider]  hydrALAZINE (APRESOLINE) 25 MG tablet Take 1 tablet (25 mg total) by mouth every 8 (eight) hours. 03/27/18   Loletha Grayer, MD  HYDROcodone-acetaminophen (NORCO/VICODIN) 5-325 MG tablet Take 1-2 tablets by mouth every 6 (six) hours as needed for moderate pain or severe pain. 12/21/18    Fritzi Mandes, MD    Physical Exam: Vitals:   10/22/19 1556 10/22/19 1605 10/22/19 1700 10/22/19 1715  BP:  (!) 179/98 (!) 166/86   Pulse: 71 73 67 71  Resp: (!) 28 15 17  (!) 21  Temp:      TempSrc:      SpO2: 100% 95% 93% 96%  Weight:      Height:       General: Not in acute distress HEENT:       Eyes: PERRL, EOMI, no scleral icterus.       ENT: No discharge from the ears and nose, no pharynx injection, no tonsillar enlargement.        Neck: No JVD, no bruit, no mass felt. Heme: No neck lymph node enlargement. Cardiac: S1/S2, RRR, No murmurs, No gallops or rubs. Respiratory: No rales, wheezing, rhonchi or rubs. GI: Soft, nondistended, nontender, no rebound pain, no organomegaly, BS present. GU: No hematuria Ext: has trace leg edema bilaterally. 1+DP/PT pulse bilaterally. Musculoskeletal: No joint deformities, No joint redness or warmth, no limitation of ROM in spin. Skin: No rashes.  Neuro: Alert, oriented X3, cranial nerves II-XII grossly intact, moves all extremities normally.   Psych: Patient is not psychotic, no suicidal or hemocidal ideation.  Labs on Admission: I have personally reviewed following labs and imaging studies  CBC: Recent Labs  Lab 10/22/19 1419  WBC 10.9*  NEUTROABS 7.4  HGB 13.3  HCT 39.5  MCV 98.8  PLT 694   Basic Metabolic Panel: Recent Labs  Lab 10/22/19 1419  NA 141  K 3.2*  CL 100  CO2 26  GLUCOSE 92  BUN 49*  CREATININE 6.95*  CALCIUM 8.3*   GFR: Estimated Creatinine Clearance: 8.2 mL/min (A) (by C-G formula based on SCr of 6.95 mg/dL (H)). Liver Function Tests: No results for input(s): AST, ALT, ALKPHOS, BILITOT, PROT, ALBUMIN in the last 168 hours. No results for input(s): LIPASE, AMYLASE in the last 168 hours. No results for input(s): AMMONIA in the last 168 hours. Coagulation Profile: No results for input(s): INR, PROTIME in the last 168 hours. Cardiac Enzymes: No results for input(s): CKTOTAL, CKMB, CKMBINDEX, TROPONINI  in the last 168 hours. BNP (last 3 results) No results for input(s): PROBNP in the last 8760 hours. HbA1C: No results for input(s): HGBA1C in the last 72 hours. CBG: Recent Labs  Lab 10/22/19 1131  GLUCAP 94   Lipid Profile: No results for input(s): CHOL, HDL, LDLCALC, TRIG, CHOLHDL, LDLDIRECT in the last 72 hours. Thyroid Function Tests: No results for input(s): TSH, T4TOTAL, FREET4, T3FREE, THYROIDAB  in the last 72 hours. Anemia Panel: No results for input(s): VITAMINB12, FOLATE, FERRITIN, TIBC, IRON, RETICCTPCT in the last 72 hours. Urine analysis:    Component Value Date/Time   COLORURINE YELLOW (A) 10/22/2019 1218   APPEARANCEUR TURBID (A) 10/22/2019 1218   LABSPEC 1.014 10/22/2019 1218   PHURINE 6.0 10/22/2019 1218   GLUCOSEU NEGATIVE 10/22/2019 1218   HGBUR SMALL (A) 10/22/2019 1218   BILIRUBINUR NEGATIVE 10/22/2019 [place] 10/22/2019 1218   PROTEINUR 100 (A) 10/22/2019 1218   NITRITE NEGATIVE 10/22/2019 1218   LEUKOCYTESUR LARGE (A) 10/22/2019 1218   Sepsis Labs: @LABRCNTIP (procalcitonin:4,lacticidven:4) ) Recent Results (from the past 240 hour(s))  SARS Coronavirus 2 by RT PCR (hospital order, performed in [place] hospital lab) Nasopharyngeal Nasopharyngeal Swab     Status: None   Collection Time: 10/22/19 12:55 PM   Specimen: Nasopharyngeal Swab  Result Value Ref Range Status   SARS Coronavirus 2 NEGATIVE NEGATIVE Final    Comment: (NOTE) SARS-CoV-2 target nucleic acids are NOT DETECTED. The SARS-CoV-2 RNA is generally detectable in upper and lower respiratory specimens during the acute phase of infection. The lowest concentration of SARS-CoV-2 viral copies this assay can detect is 250 copies / mL. A negative result does not preclude SARS-CoV-2 infection and should not be used as the sole basis for treatment or other patient management decisions.  A negative result may occur with improper specimen collection / handling, submission of  specimen other than nasopharyngeal swab, presence of viral mutation(s) within the areas targeted by this assay, and inadequate number of viral copies (<250 copies / mL). A negative result must be combined with clinical observations, patient history, and epidemiological information. Fact Sheet for Patients:   StrictlyIdeas.no Fact Sheet for Healthcare Providers: BankingDealers.co.za This test is not yet approved or cleared  by the [place] FDA and has been authorized for detection and/or diagnosis of SARS-CoV-2 by FDA under an Emergency Use Authorization (EUA).  This EUA will remain in effect (meaning this test can be used) for the duration of the COVID-19 declaration under Section 564(b)(1) of the Act, 21 U.S.C. section 360bbb-3(b)(1), unless the authorization is terminated or revoked sooner. Performed at [facility], [address] [place], [place] [address]      Radiological Exams on Admission: US Pelvis Limited  Result Date: 10/22/2019 CLINICAL DATA:  74 year old male with a history of urinary frequency EXAM: LIMITED ULTRASOUND OF PELVIS TECHNIQUE: Limited transabdominal ultrasound examination of the pelvis was performed. COMPARISON:  None. FINDINGS: Urinary bladder measures 440 cc prevoid, 403 cc post void. Prostate volume estimated 15 cc. IMPRESSION: Significant post void residual indicating urinary retention. Prostate volume estimated 15 cc. Electronically Signed   By: Corrie Mckusick D.O.   On: 10/22/2019 12:24   DG Chest Portable 1 View  Result Date: 10/22/2019 CLINICAL DATA:  Productive cough for 4 days with congestion and watery eyes. Current smoker. History of diabetes. EXAM: PORTABLE CHEST 1 VIEW COMPARISON:  Chest radiographs 06/12/2018 and 03/21/2018. FINDINGS: 1141 hours. Dialysis catheter has been removed in the interval. The visualized heart size and mediastinal contours are stable with mild aortic  atherosclerosis. There is a new moderate-sized left pleural effusion with associated left basilar pulmonary opacity. No significant right pleural effusion. No pneumothorax. The pulmonary vasculature is slightly indistinct, and there may be mild pulmonary edema. No significant osseous findings. IMPRESSION: New moderate-sized left pleural effusion with associated left basilar atelectasis or infiltrate. Possible mild pulmonary edema. Electronically Signed   By: Gwyndolyn Saxon  Lin Landsman M.D.   On: 10/22/2019 12:18     EKG:  Not done in ED, will get one.   Assessment/Plan Principal Problem:   Pulmonary edema Active Problems:   Type II diabetes mellitus with renal manifestations (HCC)   Hypertension   ESRD on dialysis ([place])   HLD (hyperlipidemia)   Stroke (HCC)   Depression   Tobacco abuse   Pleural effusion on left   Acute urinary retention   Diarrhea   Acute on chronic diastolic CHF (congestive heart failure) (HCC)   Pulmonary edema and acute on chronic diastolic CHF: Patient's shortness of breath is most likely due to pulmonary edema and CHF exacerbation as shown by chest x-ray.  2D echo on 03/22/2018 showed EF 55 to 60% with grade 2 diastolic dysfunction  -Placed on MedSurg bed for observation -As needed albuterol -Renal consulted for dialysis  Type II diabetes mellitus with renal manifestations ([place]): Most recent A1c 5.6, well controled. Patient is taking glipizide at home -SSI  HTN:  -Continue home medications: Amlodipine, clonidine, hydralazine, -hydralazine prn  ESRD on dialysis ([place]) -Renal was consulted for dialysis  HLD (hyperlipidemia)  -Lipitor  Stroke (HCC)  -Aspirin and Lipitor, Plavix  Depression -Lexapro  Tobacco abuse -Nicotine patch  Pleural effusion on left: Likely due to ESRED and CHF -Reevaluate after dialysis  Acute urinary retention: Patient's urinary frequency is likely due to fluid overload incontinence secondary to acute urinary retention -in and out  catch  Diarrhea -Follow-up C. difficile PCR and GI pathogen panel      DVT ppx: SQ Heparin    Code Status: Full code Family Communication: not done, no family member is at bed side.  Disposition Plan:  Anticipate discharge back to previous home environment Consults called:  Dr. Theador Hawthorne of renal Admission status: Med-surg bed for obs      Status is: Observation  The patient remains OBS appropriate and will d/c before 2 midnights.  Dispo: The patient is from: Home              Anticipated d/c is to: Home              Anticipated d/c date is: 1 day              Patient currently is not medically stable to d/c.           Date of Service 10/22/2019    Ivor Costa [facility]   If 7PM-7AM, please contact night-coverage [email or web address] 10/22/2019, 5:53 PM

## 2019-10-22 NOTE — ED Notes (Signed)
Pt given remote and blanket

## 2019-10-22 NOTE — Progress Notes (Signed)
Hd started  

## 2019-10-22 NOTE — ED Triage Notes (Signed)
Pt to ED via POV c/o cough and chest congestion as well as increased urination. Pt states that he was unable to sleep last night due to having to urinate so much. Pt states that he has cough that is productive with green mucus. Pt is a dialysis pt. Last treatment was on Thursday, is supposed to go today as well. Pt is in NAD.

## 2019-10-22 NOTE — ED Notes (Signed)
Pt taken to dialysis 

## 2019-10-22 NOTE — Consult Note (Signed)
FRENCH KENDRA MRN: [id number] DOB/AGE: 1945/11/01 74 y.o. Primary Care Physician:Hedrick, Jeneen Rinks, MD Admit date: 10/22/2019 Chief Complaint:  Chief Complaint  Patient presents with  . Cough  . Polyuria   HPI: Patient is a 74 year old African-American male with a past medical history of end-stage disease-on hemodialysis Tuesday Thursday Saturday schedule, hypertension, hyperlipidemia hyperlipidemia, diabetes mellitus, CVA, depression, history of prostate cancer, history of tobacco abuse, diastolic CHF who came to the ER with chief complaint of cough and urinary retention   History of present illness dates back to past 4 days ago when patient started having cough it was increasing in frequency. It was productive associated green phlegm. Patient also complained of increasing chest congestion. Patient also complains of urinary retention.  Patient then went on to explain that how he was having stop and go with his urinary stream.  Upon evaluation in the ER Patient had UA done which was turbid.  Patient also had chest x-ray done which was positive for new moderate-sized left pleural effusion associated with left basilar atelectasis and possible pulmonary edema. Nephrology was consulted for the renal replacement therapy requirement. Patient was seen in the ER after catheter placement Patient states now everything is better No complaint of fever cough or chills No complaint of chest pain No complaint of nausea vomiting diarrhea No apparent constipation No complaint of change in speech or change in vision. No complaint of orthopnea No complaint of recent Covid exposure   Past Medical History:  Diagnosis Date  . Chronic kidney disease   . Depression   . Diabetes mellitus without complication ([place])   . Glaucoma   . Glaucoma   . Gout   . Heart murmur   . HLD (hyperlipidemia)   . Hypertension   . Prostate cancer ([place])   . Renal failure    dialysis t/t/s  . Stroke ([place])    tia x 2         Family History  Problem Relation Age of Onset  . Varicose Veins Neg Hx     Social History:  reports that he has been smoking cigarettes. He has been smoking about 0.10 packs per day. He has never used smokeless tobacco. He reports previous alcohol use. He reports that he does not use drugs.   Allergies:  Allergies  Allergen Reactions  . Penicillin G Other (See Comments)    Other reaction(s): Unknown DID THE REACTION INVOLVE: Swelling of the face/tongue/throat, SOB, or low BP? Unknown Sudden or severe rash/hives, skin peeling, or the inside of the mouth or nose? Unknown Did it require medical treatment? Unknown When did it last happen?unknown If all above answers are "NO", may proceed with cephalosporin use.     (Not in a hospital admission)      DGU:YQIHK from the symptoms mentioned above,there are no other symptoms referable to all systems reviewed.  Marland Kitchen dextromethorphan-guaiFENesin  1 tablet Oral BID  . nicotine  21 mg Transdermal Daily        Physical Exam: Vital signs in last 24 hours: Temp:  [98.3 F (36.8 C)] 98.3 F (36.8 C) (05/22 1107) Pulse Rate:  [52-74] 52 (05/22 1330) Resp:  [16-26] 26 (05/22 1330) BP: (162-170)/(80-97) 162/83 (05/22 1330) SpO2:  [93 %-98 %] 93 % (05/22 1330) Weight:  [61.2 kg] 61.2 kg (05/22 1105) Weight change:     Intake/Output from previous day: No intake/output data recorded. No intake/output data recorded.   Physical Exam: General- pt is awake,alert, oriented to time place and person Resp-  No acute REsp distress, decreased breath sounds at bases CVS- S1S2 regular in rate and rhythm GIT- BS+, soft, NT, ND EXT-trace LE Edema, no cyanosis CNS- CN 2-12 grossly intact. Moving all 4 extremities Psych- normal mod and affect Access-  AVF GU-Foley catheter in situ  Lab Results: CBC Recent Labs    10/22/19 1419  WBC 10.9*  HGB 13.3  HCT 39.5  PLT 168    BMET Recent Labs    10/22/19 1419  NA 141  K  3.2*  CL 100  CO2 26  GLUCOSE 92  BUN 49*  CREATININE 6.95*  CALCIUM 8.3*    MICRO No results found for this or any previous visit (from the past 240 hour(s)).    Lab Results  Component Value Date   PTH 226 (H) 03/25/2018   CALCIUM 8.3 (L) 10/22/2019   PHOS 4.4 12/16/2018      Impression:   Patient is a 74 year old African-American male with a past medical history of end-stage renal disease, diabetes mellitus, history of prostate cancer, history of CVA, history of peripheral vascular disease, history of bilateral transmetatarsal amputation who was admitted with new pleural effusion, pulmonary edema and urinary retention  1)Renal patient is on hemodialysis. Patient is on Tuesday Thursday Saturday second shift schedule Patient undergoes dialysis at [facility] located in [place]. Patient will be dialyzed today  2)HTN Patient blood pressure uncontrolled Fluid removal during dialysis will help   3)Anemia of chronic disease. Patient has history of anemia of chronic disease with hemoglobin being around 9-11 since 2019 Patient is currently hemoconcentrated with hemoglobin being at 13.3 we will hold off on Epogen for now  4) secondary hyperparathyroidism CKD Mineral-Bone Disorder  Secondary Hyperparathyroidism present . Phosphorus at goal.   5) pleural effusion  Patient will need thoracentesis Primary team is following   6) electrolytes   Hypokalemic  Will use higher K bath during dialysis   NOrmonatremic  7)Acid base Co2 at goal  8) pulmonary edema Fluid removal in dialysis should help  9) urinary retention now status post catheter placement Patient is now clinically better     Plan:  We will dialyze patient today No need for Epogen at this time Fluid removal should help with pulmonary edema as well as hypertension Because of hypokalemia we will use high potassium bath      Saidi Santacroce s [facility] 10/22/2019, 3:43 PM

## 2019-10-22 NOTE — ED Provider Notes (Signed)
[facility] Emergency Department Provider Note   ____________________________________________   First MD Initiated Contact with Patient 10/22/19 1128     (approximate)  I have reviewed the triage vital signs and the nursing notes.   HISTORY  Chief Complaint Cough and Polyuria    HPI Jose [name] is a 74 y.o. male patient complain of productive cough and chest congestion for 4 days.  Patient state producing green mucus.  Patient also complained of nocturnal urinary frequency.  Patient is dialysis patient who was scheduled for procedure today but decided come to the emergency room secondary to unable to sleep due to coughing.  Patient denies fever/ chills associated with complaint.  Patient has completed COVID-19 vaccine series.       Past Medical History:  Diagnosis Date  . Chronic kidney disease   . Depression   . Diabetes mellitus without complication ([place])   . Glaucoma   . Glaucoma   . Gout   . Heart murmur   . HLD (hyperlipidemia)   . Hypertension   . Prostate cancer ([place])   . Renal failure    dialysis t/t/s  . Stroke ([place])    tia x 2    Patient Active Problem List   Diagnosis Date Noted  . Foot ulcer ([place]) 12/15/2018  . Ischemic foot 10/11/2018  . Atherosclerosis of native arteries of the extremities with gangrene ([place]) 07/09/2018  . Gangrene of toe of right foot ([place]) 06/13/2018  . Diabetes mellitus ([place]) 04/16/2018  . Hypertension 04/16/2018  . ESRD on dialysis ([place]) 04/16/2018  . Malnutrition of moderate degree 03/27/2018  . Renal failure 03/21/2018    Past Surgical History:  Procedure Laterality Date  . AMPUTATION TOE Bilateral 07/23/2018   Procedure: TOE MPJ RIGHT 3RD AND LEFT 2ND;  Surgeon: Samara Deist, DPM;  Location: [facility] ORS;  Service: Podiatry;  Laterality: Bilateral;  . APPENDECTOMY    . AV FISTULA PLACEMENT Left 06/09/2018   Procedure: ARTERIOVENOUS (AV) FISTULA CREATION ( BRACHIAL CEPHALIC);  Surgeon: Algernon Huxley, MD;  Location: [facility] ORS;  Service: Vascular;  Laterality: Left;  . CHOLECYSTECTOMY    . DIALYSIS/PERMA CATHETER INSERTION N/A 03/24/2018   Procedure: DIALYSIS/PERMA CATHETER INSERTION;  Surgeon: Katha Cabal, MD;  Location: [place] [facility];  Service: Cardiovascular;  Laterality: N/A;  . DIALYSIS/PERMA CATHETER REMOVAL N/A 09/06/2018   Procedure: DIALYSIS/PERMA CATHETER REMOVAL;  Surgeon: Algernon Huxley, MD;  Location: [place] [facility];  Service: Cardiovascular;  Laterality: N/A;  . INSERTION PROSTATE RADIATION SEED    . IRRIGATION AND DEBRIDEMENT FOOT Right 12/17/2018   Procedure: IRRIGATION AND DEBRIDEMENT FOOT;  Surgeon: Samara Deist, DPM;  Location: [facility] ORS;  Service: Podiatry;  Laterality: Right;  . LOWER EXTREMITY ANGIOGRAPHY Right 06/16/2018   Procedure: LOWER EXTREMITY ANGIOGRAPHY;  Surgeon: Algernon Huxley, MD;  Location: [place] [facility];  Service: Cardiovascular;  Laterality: Right;  . LOWER EXTREMITY ANGIOGRAPHY Left 08/30/2018   Procedure: LOWER EXTREMITY ANGIOGRAPHY;  Surgeon: Algernon Huxley, MD;  Location: [place] [facility];  Service: Cardiovascular;  Laterality: Left;  . LOWER EXTREMITY ANGIOGRAPHY Right 09/30/2018   Procedure: LOWER EXTREMITY ANGIOGRAPHY;  Surgeon: Algernon Huxley, MD;  Location: [place] [facility];  Service: Cardiovascular;  Laterality: Right;  . LOWER EXTREMITY ANGIOGRAPHY Left 10/07/2018   Procedure: LOWER EXTREMITY ANGIOGRAPHY;  Surgeon: Algernon Huxley, MD;  Location: [place] [facility];  Service: Cardiovascular;  Laterality: Left;  . LOWER EXTREMITY ANGIOGRAPHY Right 12/20/2018  Procedure: Lower Extremity Angiography;  Surgeon: Algernon Huxley, MD;  Location: [place] [facility];  Service: Cardiovascular;  Laterality: Right;  . TRANSMETATARSAL AMPUTATION Bilateral 10/13/2018   Procedure: 1.  Amputation right great toe MTPJ 2.  Amputation right second toe MTPJ 3.  Amputation right fourth toe MTPJ 4.  Amputation right fifth toe MTPJ 5.  Excision  distal second metatarsal head right foot 6.  Excision distal third metatarsal head right foot 7.  Amputation left third toe 8.  Incision and drainage infectionleft 2nd toe amputation site. ;  Surgeon: Samara Deist, DPM;  Location    Prior to Admission medications   Medication Sig Start Date End Date Taking? Authorizing Provider  allopurinol (ZYLOPRIM) 100 MG tablet Take 100 mg by mouth daily.     [provider]  amLODipine (NORVASC) 10 MG tablet Take 10 mg by mouth daily.     [provider]  aspirin EC 81 MG EC tablet Take 1 tablet (81 mg total) by mouth daily. 06/18/18   Dustin Flock, MD  atorvastatin (LIPITOR) 10 MG tablet Take 1 tablet (10 mg total) by mouth daily at 6 PM. 08/30/18   Stegmayer, Janalyn Harder, PA-C  calcium acetate (PHOSLO) 667 MG capsule Take 2 capsules (1,334 mg total) by mouth 3 (three) times daily with meals. 12/21/18   Fritzi Mandes, MD  cloNIDine (CATAPRES) 0.2 MG tablet TAKE 1 TABLET BY MOUTH TWICE A DAY Patient taking differently: Take 0.2 mg by mouth 2 (two) times daily.  05/14/18   Algernon Huxley, MD  clopidogrel (PLAVIX) 75 MG tablet Take 1 tablet (75 mg total) by mouth daily. 06/18/18   Dustin Flock, MD  docusate sodium (COLACE) 100 MG capsule Take 1 capsule (100 mg total) by mouth 2 (two) times daily as needed for mild constipation. 10/15/18   Vaughan Basta, MD  escitalopram (LEXAPRO) 10 MG tablet Take 10 mg by mouth daily.     [provider]  gabapentin (NEURONTIN) 300 MG capsule TAKE 1 CAPSULE BY MOUTH THREE TIMES A DAY 11/08/18   Kris Hartmann, NP  glipiZIDE (GLUCOTROL) 5 MG tablet Take 5 mg by mouth daily before breakfast.     [provider]  hydrALAZINE (APRESOLINE) 25 MG tablet Take 1 tablet (25 mg total) by mouth every 8 (eight) hours. 03/27/18   Loletha Grayer, MD  HYDROcodone-acetaminophen (NORCO/VICODIN) 5-325 MG tablet Take 1-2 tablets by mouth every 6 (six) hours as needed for moderate pain or severe pain.  12/21/18   Fritzi Mandes, MD    Allergies Penicillin g  Family History  Problem Relation Age of Onset  . Varicose Veins Neg Hx     Social History Social History   Tobacco Use  . Smoking status: Current Some Day Smoker    Packs/day: 0.10    Types: Cigarettes  . Smokeless tobacco: Never Used  Substance Use Topics  . Alcohol use: Not Currently    Alcohol/week: 0.0 standard drinks  . Drug use: Never    Review of Systems Constitutional: No fever/chills Eyes: No visual changes. ENT: No sore throat. Cardiovascular: Denies chest pain. Respiratory: Denies shortness of breath.  Productive cough. Gastrointestinal: No abdominal pain.  No nausea, no vomiting.  No diarrhea.  No constipation. Genitourinary: Negative for dysuria.  Nocturnal urinary frequency. Musculoskeletal: Negative for back pain. Skin: Negative for rash. Neurological: Negative for headaches, focal weakness or numbness. Endocrine:  Gout, hyperlipidemia, hypertension, and chronic renal disease. Allergic/Immunilogical: Penicillin G ____________________________________________   PHYSICAL EXAM:  VITAL  SIGNS: ED Triage Vitals  Enc Vitals Group     BP 10/22/19 1104 (!) 170/80     Pulse Rate 10/22/19 1104 74     Resp 10/22/19 1104 16     Temp 10/22/19 1107 98.3 F (36.8 C)     Temp Source 10/22/19 1107 Oral     SpO2 10/22/19 1104 98 %     Weight 10/22/19 1105 135 lb (61.2 kg)     Height 10/22/19 1105 6\' 1"  (1.854 m)     Head Circumference --      Peak Flow --      Pain Score 10/22/19 1105 0     Pain Loc --      Pain Edu? --      Excl. in [place]? --     Constitutional: Alert and oriented. Well appearing and in no acute distress. Neck: No stridor.   Hematological/Lymphatic/Immunilogical: No cervical lymphadenopathy. Cardiovascular: Normal rate, regular rhythm. Grossly normal heart sounds.  Good peripheral circulation.  Elevated blood pressure. Respiratory: Normal respiratory effort.  No retractions. Lungs  decreased breath sounds left lower lobe. Gastrointestinal: Soft and nontender. No distention. No abdominal bruits. No CVA tenderness. Musculoskeletal: No lower extremity tenderness nor edema.  No joint effusions. Neurologic:  Normal speech and language. No gross focal neurologic deficits are appreciated. No gait instability. Skin:  Skin is warm, dry and intact. No rash noted. Psychiatric: Mood and affect are normal. Speech and behavior are normal.  ____________________________________________   LABS (all labs ordered are listed, but only abnormal results are displayed)  Labs Reviewed  GLUCOSE, CAPILLARY  URINALYSIS, COMPLETE (UACMP) WITH MICROSCOPIC  CBG MONITORING, ED   ____________________________________________  EKG   ____________________________________________  RADIOLOGY  ED MD interpretation:    Official radiology report(s): US Pelvis Limited  Result Date: 10/22/2019 CLINICAL DATA:  74 year old male with a history of urinary frequency EXAM: LIMITED ULTRASOUND OF PELVIS TECHNIQUE: Limited transabdominal ultrasound examination of the pelvis was performed. COMPARISON:  None. FINDINGS: Urinary bladder measures 440 cc prevoid, 403 cc post void. Prostate volume estimated 15 cc. IMPRESSION: Significant post void residual indicating urinary retention. Prostate volume estimated 15 cc. Electronically Signed   By: Corrie Mckusick D.O.   On: 10/22/2019 12:24   DG Chest Portable 1 View  Result Date: 10/22/2019 CLINICAL DATA:  Productive cough for 4 days with congestion and watery eyes. Current smoker. History of diabetes. EXAM: PORTABLE CHEST 1 VIEW COMPARISON:  Chest radiographs 06/12/2018 and 03/21/2018. FINDINGS: 1141 hours. Dialysis catheter has been removed in the interval. The visualized heart size and mediastinal contours are stable with mild aortic atherosclerosis. There is a new moderate-sized left pleural effusion with associated left basilar pulmonary opacity. No significant  right pleural effusion. No pneumothorax. The pulmonary vasculature is slightly indistinct, and there may be mild pulmonary edema. No significant osseous findings. IMPRESSION: New moderate-sized left pleural effusion with associated left basilar atelectasis or infiltrate. Possible mild pulmonary edema. Electronically Signed   By: Richardean Sale M.D.   On: 10/22/2019 12:18    ____________________________________________   PROCEDURES  Procedure(s) performed (including Critical Care):  Procedures   ____________________________________________   INITIAL IMPRESSION / ASSESSMENT AND PLAN / ED COURSE  As part of my medical decision making, I reviewed the following data within the [place]     Patient presents with cough and dyspnea for few days.  Patient states cough is productive.  Checks x-ray shows a moderate left pleural effusion.  Patient will be taken over to  major for definitive evaluation and treatment.          ____________________________________________   FINAL CLINICAL IMPRESSION(S) / ED DIAGNOSES  Final diagnoses:  Pleural effusion, left     ED Discharge Orders    None       Note:  This document was prepared using Dragon voice recognition software and may include unintentional dictation errors.    Sable Feil, PA-C 10/22/19 1237    Carrie Mew, MD 10/22/19 [phone]

## 2019-10-22 NOTE — ED Notes (Signed)
See triage note  Presents with cough,congestion and watery eyes   States he was unable to sleep well d/t cough   Also has slight scratchy throat

## 2019-10-22 NOTE — Progress Notes (Signed)
Pt tolerated HD tx well no issues UFG achieved 2.5L AVF +/+

## 2019-10-23 DIAGNOSIS — Z7902 Long term (current) use of antithrombotics/antiplatelets: Secondary | ICD-10-CM | POA: Diagnosis not present

## 2019-10-23 DIAGNOSIS — R0602 Shortness of breath: Secondary | ICD-10-CM | POA: Diagnosis present

## 2019-10-23 DIAGNOSIS — I132 Hypertensive heart and chronic kidney disease with heart failure and with stage 5 chronic kidney disease, or end stage renal disease: Secondary | ICD-10-CM | POA: Diagnosis present

## 2019-10-23 DIAGNOSIS — Z79899 Other long term (current) drug therapy: Secondary | ICD-10-CM | POA: Diagnosis not present

## 2019-10-23 DIAGNOSIS — N39490 Overflow incontinence: Secondary | ICD-10-CM | POA: Diagnosis present

## 2019-10-23 DIAGNOSIS — N401 Enlarged prostate with lower urinary tract symptoms: Secondary | ICD-10-CM | POA: Diagnosis present

## 2019-10-23 DIAGNOSIS — N2581 Secondary hyperparathyroidism of renal origin: Secondary | ICD-10-CM | POA: Diagnosis present

## 2019-10-23 DIAGNOSIS — E876 Hypokalemia: Secondary | ICD-10-CM | POA: Diagnosis present

## 2019-10-23 DIAGNOSIS — Z7984 Long term (current) use of oral hypoglycemic drugs: Secondary | ICD-10-CM | POA: Diagnosis not present

## 2019-10-23 DIAGNOSIS — Z8546 Personal history of malignant neoplasm of prostate: Secondary | ICD-10-CM | POA: Diagnosis not present

## 2019-10-23 DIAGNOSIS — R338 Other retention of urine: Secondary | ICD-10-CM | POA: Diagnosis present

## 2019-10-23 DIAGNOSIS — J811 Chronic pulmonary edema: Secondary | ICD-10-CM

## 2019-10-23 DIAGNOSIS — F17210 Nicotine dependence, cigarettes, uncomplicated: Secondary | ICD-10-CM | POA: Diagnosis present

## 2019-10-23 DIAGNOSIS — E1151 Type 2 diabetes mellitus with diabetic peripheral angiopathy without gangrene: Secondary | ICD-10-CM | POA: Diagnosis present

## 2019-10-23 DIAGNOSIS — Z7982 Long term (current) use of aspirin: Secondary | ICD-10-CM | POA: Diagnosis not present

## 2019-10-23 DIAGNOSIS — E785 Hyperlipidemia, unspecified: Secondary | ICD-10-CM | POA: Diagnosis present

## 2019-10-23 DIAGNOSIS — I5033 Acute on chronic diastolic (congestive) heart failure: Secondary | ICD-10-CM | POA: Diagnosis present

## 2019-10-23 DIAGNOSIS — Z89431 Acquired absence of right foot: Secondary | ICD-10-CM | POA: Diagnosis not present

## 2019-10-23 DIAGNOSIS — Z89432 Acquired absence of left foot: Secondary | ICD-10-CM | POA: Diagnosis not present

## 2019-10-23 DIAGNOSIS — Z8673 Personal history of transient ischemic attack (TIA), and cerebral infarction without residual deficits: Secondary | ICD-10-CM | POA: Diagnosis not present

## 2019-10-23 DIAGNOSIS — F329 Major depressive disorder, single episode, unspecified: Secondary | ICD-10-CM | POA: Diagnosis present

## 2019-10-23 DIAGNOSIS — Z992 Dependence on renal dialysis: Secondary | ICD-10-CM | POA: Diagnosis not present

## 2019-10-23 DIAGNOSIS — Z20822 Contact with and (suspected) exposure to covid-19: Secondary | ICD-10-CM | POA: Diagnosis present

## 2019-10-23 DIAGNOSIS — D631 Anemia in chronic kidney disease: Secondary | ICD-10-CM | POA: Diagnosis present

## 2019-10-23 DIAGNOSIS — E1122 Type 2 diabetes mellitus with diabetic chronic kidney disease: Secondary | ICD-10-CM | POA: Diagnosis present

## 2019-10-23 DIAGNOSIS — N186 End stage renal disease: Secondary | ICD-10-CM | POA: Diagnosis present

## 2019-10-23 LAB — CBC
HCT: 37.9 % — ABNORMAL LOW (ref 39.0–52.0)
Hemoglobin: 12.9 g/dL — ABNORMAL LOW (ref 13.0–17.0)
MCH: 33.1 pg (ref 26.0–34.0)
MCHC: 34 g/dL (ref 30.0–36.0)
MCV: 97.2 fL (ref 80.0–100.0)
Platelets: 152 10*3/uL (ref 150–400)
RBC: 3.9 MIL/uL — ABNORMAL LOW (ref 4.22–5.81)
RDW: 15.6 % — ABNORMAL HIGH (ref 11.5–15.5)
WBC: 11.4 10*3/uL — ABNORMAL HIGH (ref 4.0–10.5)
nRBC: 0 % (ref 0.0–0.2)

## 2019-10-23 LAB — BASIC METABOLIC PANEL
Anion gap: 13 (ref 5–15)
BUN: 26 mg/dL — ABNORMAL HIGH (ref 8–23)
CO2: 27 mmol/L (ref 22–32)
Calcium: 8.4 mg/dL — ABNORMAL LOW (ref 8.9–10.3)
Chloride: 98 mmol/L (ref 98–111)
Creatinine, Ser: 4.42 mg/dL — ABNORMAL HIGH (ref 0.61–1.24)
GFR calc Af Amer: 14 mL/min — ABNORMAL LOW (ref >60)
GFR calc non Af Amer: 12 mL/min — ABNORMAL LOW (ref >60)
Glucose, Bld: 106 mg/dL — ABNORMAL HIGH (ref 70–99)
Potassium: 3.5 mmol/L (ref 3.5–5.1)
Sodium: 138 mmol/L (ref 135–145)

## 2019-10-23 LAB — GLUCOSE, CAPILLARY
Glucose-Capillary: 105 mg/dL — ABNORMAL HIGH (ref 70–99)
Glucose-Capillary: 161 mg/dL — ABNORMAL HIGH (ref 70–99)
Glucose-Capillary: 100 mg/dL — ABNORMAL HIGH (ref 70–99)
Glucose-Capillary: 144 mg/dL — ABNORMAL HIGH (ref 70–99)

## 2019-10-23 LAB — MRSA PCR SCREENING: MRSA by PCR: NEGATIVE

## 2019-10-23 MED ORDER — TAMSULOSIN HCL 0.4 MG PO CAPS
0.4000 mg | ORAL_CAPSULE | Freq: Every day | ORAL | Status: DC
  Administered 2019-10-23 – 2019-10-24 (×2): 0.4 mg via ORAL
  Filled 2019-10-23 (×2): qty 1

## 2019-10-23 NOTE — Hospital Course (Addendum)
Jose [name] is a 74 y.o. male with medical history of ESRD-HD (TTS), hypertension, hyperlipidemia, diabetes mellitus, stroke (unknown type), gout, depression, prostate cancer, tobacco abuse, dCHF, who presented to the ED on Saturday 10/22/19 with productive cough, shortness breath.  Last dialysis was Thursday per his usual schedule.  He reported polyuria on admission, subsequently found with ~400 cc post-void bladder residual consistent with urinary retention.  He also reported a few episodes of watery diarrhea on the day of presentation.  No fevers, chills, dysuria or other acute complaints.  In the ED, hypertensive 170/80 with otherwise normal vitals.  Labs showed mild leukocytosis 10.7k, UA turbid with large leukocytes, negative bacteria, no wbc's), K 3.2, Cr 6.95, BUN 49 consistent with ESRD.  Patient admitted to hospitalist service with nephrology consulted.  He received dialysis on 5/22 PM with improvement in respiratory symptoms.

## 2019-10-23 NOTE — Progress Notes (Signed)
Dr Arbutus Ped said to only replace the foley if the patient has more than 469ml in his bladder and is unable to void

## 2019-10-23 NOTE — Progress Notes (Signed)
Patient has had 2 smears of BM's but not enough to collect. Order received from Dr Arbutus Ped to discontinue isolation

## 2019-10-23 NOTE — Progress Notes (Signed)
Jose [name]  MRN: [id number]  DOB/AGE: 74/07/1945 74 y.o.  Primary Care Physician:Hedrick, Jeneen Rinks, MD  Admit date: 10/22/2019  Chief Complaint:  Chief Complaint  Patient presents with  . Cough  . Polyuria    S-Pt presented on  10/22/2019 with  Chief Complaint  Patient presents with  . Cough  . Polyuria  .    Pt today feels better  Medications   . allopurinol  100 mg Oral Daily  . amLODipine  10 mg Oral Daily  . aspirin EC  81 mg Oral Daily  . atorvastatin  10 mg Oral q1800  . calcium acetate  1,334 mg Oral TID WC  . Chlorhexidine Gluconate Cloth  6 each Topical Q0600  . cloNIDine  0.2 mg Oral BID  . clopidogrel  75 mg Oral Daily  . dextromethorphan-guaiFENesin  1 tablet Oral BID  . escitalopram  10 mg Oral Daily  . heparin  5,000 Units Subcutaneous Q8H  . hydrALAZINE  25 mg Oral Q8H  . insulin aspart  0-5 Units Subcutaneous QHS  . insulin aspart  0-9 Units Subcutaneous TID WC  . nicotine  21 mg Transdermal Daily         [id number] from the symptoms mentioned above,there are no other symptoms referable to all systems reviewed.  Physical Exam: Vital signs in last 24 hours: Temp:  [97.6 F (36.4 C)-99.3 F (37.4 C)] 99.3 F (37.4 C) (05/23 0740) Pulse Rate:  [36-79] 72 (05/23 0740) Resp:  [15-28] 20 (05/23 0740) BP: (136-180)/(64-102) 136/78 (05/23 0740) SpO2:  [93 %-100 %] 95 % (05/23 0740) Weight:  [61.2 kg-64.7 kg] 64.7 kg (05/23 0419) Weight change:  Last BM Date: 10/22/19  Intake/Output from previous day: 05/22 0701 - 05/23 0700 In: 0  Out: 3100 [Urine:600] No intake/output data recorded.   Physical Exam: General- pt is awake,alert, oriented to time place and person Resp- No acute REsp distress, decreased Bs at bases. CVS- S1S2 regular in rate and rhythm GIT- BS+, soft, NT, ND EXT-trace LE Edema, no Cyanosis Access- left AVF +   Lab Results: CBC Recent Labs    10/22/19 1419 10/23/19 0607  WBC 10.9* 11.4*  HGB 13.3 12.9*  HCT 39.5  37.9*  PLT 168 152    BMET Recent Labs    10/22/19 1419 10/23/19 0607  NA 141 138  K 3.2* 3.5  CL 100 98  CO2 26 27  GLUCOSE 92 106*  BUN 49* 26*  CREATININE 6.95* 4.42*  CALCIUM 8.3* 8.4*    MICRO Recent Results (from the past 240 hour(s))  SARS Coronavirus 2 by RT PCR (hospital order, performed in [facility] hospital lab) Nasopharyngeal Nasopharyngeal Swab     Status: None   Collection Time: 10/22/19 12:55 PM   Specimen: Nasopharyngeal Swab  Result Value Ref Range Status   SARS Coronavirus 2 NEGATIVE NEGATIVE Final    Comment: (NOTE) SARS-CoV-2 target nucleic acids are NOT DETECTED. The SARS-CoV-2 RNA is generally detectable in upper and lower respiratory specimens during the acute phase of infection. The lowest concentration of SARS-CoV-2 viral copies this assay can detect is 250 copies / mL. A negative result does not preclude SARS-CoV-2 infection and should not be used as the sole basis for treatment or other patient management decisions.  A negative result may occur with improper specimen collection / handling, submission of specimen other than nasopharyngeal swab, presence of viral mutation(s) within the areas targeted by this assay, and inadequate number of viral copies (<250 copies / mL).  A negative result must be combined with clinical observations, patient history, and epidemiological information. Fact Sheet for Patients:   StrictlyIdeas.no Fact Sheet for Healthcare Providers: BankingDealers.co.za This test is not yet approved or cleared  by the [place] FDA and has been authorized for detection and/or diagnosis of SARS-CoV-2 by FDA under an Emergency Use Authorization (EUA).  This EUA will remain in effect (meaning this test can be used) for the duration of the COVID-19 declaration under Section 564(b)(1) of the Act, 21 U.S.C. section 360bbb-3(b)(1), unless the authorization is terminated or revoked  sooner. Performed at [facility], [address] [place], [place] [address]       Lab Results  Component Value Date   PTH 226 (H) 03/25/2018   CALCIUM 8.4 (L) 10/23/2019   PHOS 4.4 12/16/2018               Impression: Patient is a 74 year old African-American male with a past medical history of end-stage renal disease, diabetes mellitus, history of prostate cancer, history of CVA, history of peripheral vascular disease, history of bilateral transmetatarsal amputation who was admitted with new pleural effusion, pulmonary edema and urinary retention  1)Renal patient is on hemodialysis. Patient is on Tuesday Thursday Saturday second shift schedule Patient undergoes dialysis at [facility] located in [place]. Patient was dialyzed yesterday No need for renal placement therapy today  2)HTN Blood pressure is much better after fluid removal   3)Anemia of chronic disease. Patient has history of anemia of chronic disease  We will continue to hold Epogen for now  4) secondary hyperparathyroidism CKD Mineral-Bone Disorder  Secondary Hyperparathyroidism present . Phosphorus at goal.   5) pleural effusion   Primary team is following   6) electrolytes   Hypokalemic  Now better   NOrmonatremic  7)Acid base Co2 at goal  8) pulmonary edema Now better  9) urinary retention. Patient is to have voiding trial today    Plan:  NO acute need for HD today .      Jose [name] s Jose [name] 10/23/2019, 10:34 AM

## 2019-10-23 NOTE — Progress Notes (Signed)
PROGRESS NOTE    [name]   HAL:[id number]  DOB: [birth year]  PCP: [name], MD    DOA: 10/22/2019 LOS: 0   Brief Narrative    Jose [name] is a 74 y.o. male with medical history of ESRD-HD (TTS), hypertension, hyperlipidemia, diabetes mellitus, stroke, gout, depression, prostate cancer, tobacco abuse, dCHF, who presented to the ED on Saturday 10/22/19 with productive cough, shortness breath.  Last dialysis was Thursday per his usual schedule.  He reported polyuria on admission, subsequently found with ~400 cc post-void bladder residual consistent with urinary retention.  He also reported a few episodes of watery diarrhea on the day of presentation.  No fevers, chills, dysuria or other acute complaints.  In the ED, hypertensive 170/80 with otherwise normal vitals.  Labs showed mild leukocytosis 10.7k, UA turbid with large leukocytes, negative bacteria, no wbc's), K 3.2, Cr 6.95, BUN 49 consistent with ESRD.  Patient admitted to hospitalist service with nephrology consulted.  He received dialysis on 5/22 PM with improvement in respiratory symptoms.       Assessment & Plan   Principal Problem:   Pulmonary edema Active Problems:   Type II diabetes mellitus with renal manifestations (HCC)   Hypertension   ESRD on dialysis ([place])   HLD (hyperlipidemia)   Stroke (HCC)   Depression   Tobacco abuse   Pleural effusion on left   Acute urinary retention   Diarrhea   Acute on chronic diastolic CHF (congestive heart failure) (HCC)   Pulmonary edema and acute on chronic diastolic CHF - Resolved.  Present on admission with shortness of breath most likely due to pulmonary edema and CHF exacerbation / volume overload.  2D echo on 03/22/2018 showed EF 55 to 60% with grade 2 diastolic dysfunction.  Symptoms resolved after dialysis on night of admission.   --Nephrology following --Volume management with dialysis per nephro  Acute urinary retention -  urinary frequency reported on  admission likely due to overflow incontinence secondary to acute urinary retention.  Had post-void residuals on u/s.  Foley was placed.  Patient denies history of lower urinary tract symptoms to suggest BPH. --d/c Foley for voiding trial --started trial of Flomax --bladder scan periodically --replace Foley and consult urology if still retaining > 400cc --if spontaneously voiding, can be discharged  Diarrhea - resolved.  No BM's since admission.  Will d/c isolation precautions and stool studies.  Monitor.    Type II diabetes mellitus with renal manifestations - Most recent A1c 5.6, well controlled.  Takes glipizide at home, held.  Sliding scale Novolog and CBG's AC/HS  Hypertension - chronic, uncontrolled on admission in setting of volume overload.  Improved after dialysis.  Continue home amlodipine, clonidine, hydralazine.  PRN hydralazine as well.  ESRD on dialysis - Nephrology following.  ESRD schedule T/Th/S.  Dialyzed on admission with plan for next dialysis Tuesday per usual schedule.  Hyperlipidemia - continue Lipitor  Stroke - continue aspirin, Lipitor, Plavix  Depression - continue Lexapro  Tobacco abuse - Nicotine patch  Pleural effusion on left - Likely due to ESRED and CHF, volume overload.   No hypoxia. Will re-evaluate with xray if SOB worsens again.     DVT prophylaxis: Heparin  Diet:  Diet Orders (From admission, onward)    Start     Ordered   10/22/19 2305  Diet renal with fluid restriction Fluid restriction: 1200 mL Fluid; Room service appropriate? Yes; Fluid consistency: Thin  Diet effective now    Question Answer Comment  Fluid restriction: 1200 mL Fluid   Room service appropriate? Yes   Fluid consistency: Thin      10/22/19 2304            Code Status: Full Code    Subjective 10/23/19    Patient seen at bedside this AM.  No acute events reported.  Says his shortness of breath resolved after getting dialysis last night.  Feeling much better  today.  He denies history of BPH symptoms including weak/intermittent urinary stream.  Denies prior history of urinary retention.     Disposition Plan & Communication   Status is: Observation  The patient will require care spanning > 2 midnights and should be moved to inpatient because: urinary retention, unsuccessful voiding trial so far.  Dispo: The patient is from: Home              Anticipated d/c is to: Home              Anticipated d/c date is: 1 day              Patient currently is not medically stable to d/c.    Family Communication: none at bedside, will attempt to call   Consults, Procedures, Significant Events   Consultants:   Nephrology  Procedures:   Dialysis 5/22 PM  Antimicrobials:   None   Objective   Vitals:   10/22/19 2244 10/23/19 0419 10/23/19 0740 10/23/19 1149  BP: (!) 160/90 (!) 143/86 136/78 113/64  Pulse: 73 79 72 81  Resp: 18 20 20 18   Temp: 98.5 F (36.9 C) 99.3 F (37.4 C) 99.3 F (37.4 C) 99.6 F (37.6 C)  TempSrc: Oral Oral Oral Oral  SpO2: 94% 93% 95% 95%  Weight: 64.7 kg 64.7 kg    Height: 6\' 1"  (1.854 m)       Intake/Output Summary (Last 24 hours) at 10/23/2019 1625 Last data filed at 10/23/2019 1100 Gross per 24 hour  Intake 0 ml  Output 2800 ml  Net -2800 ml   Filed Weights   10/22/19 1105 10/22/19 2244 10/23/19 0419  Weight: 61.2 kg 64.7 kg 64.7 kg    Physical Exam:  General exam: awake, alert, no acute distress Respiratory system: decreased breath sounds, no wheezes, rales or rhonchi, normal respiratory effort. Cardiovascular system: normal S1/S2, RRR, no pedal edema.   Gastrointestinal system: soft, NT, ND, no HSM felt, +bowel sounds. Central nervous system: A&O x4. no gross focal neurologic deficits, normal speech Extremities: moves all, no edema, normal tone Skin: dry, intact, normal temperature, normal color Psychiatry: normal mood, congruent affect, judgement and insight appear normal  Labs   Data  Reviewed: I have personally reviewed following labs and imaging studies  CBC: Recent Labs  Lab 10/22/19 1419 10/23/19 0607  WBC 10.9* 11.4*  NEUTROABS 7.4  --   HGB 13.3 12.9*  HCT 39.5 37.9*  MCV 98.8 97.2  PLT 168 409   Basic Metabolic Panel: Recent Labs  Lab 10/22/19 1419 10/23/19 0607  NA 141 138  K 3.2* 3.5  CL 100 98  CO2 26 27  GLUCOSE 92 106*  BUN 49* 26*  CREATININE 6.95* 4.42*  CALCIUM 8.3* 8.4*   GFR: Estimated Creatinine Clearance: 13.6 mL/min (A) (by C-G formula based on SCr of 4.42 mg/dL (H)). Liver Function Tests: No results for input(s): AST, ALT, ALKPHOS, BILITOT, PROT, ALBUMIN in the last 168 hours. No results for input(s): LIPASE, AMYLASE in the last 168 hours. No results for input(s): AMMONIA in  the last 168 hours. Coagulation Profile: No results for input(s): INR, PROTIME in the last 168 hours. Cardiac Enzymes: No results for input(s): CKTOTAL, CKMB, CKMBINDEX, TROPONINI in the last 168 hours. BNP (last 3 results) No results for input(s): PROBNP in the last 8760 hours. HbA1C: No results for input(s): HGBA1C in the last 72 hours. CBG: Recent Labs  Lab 10/22/19 1131 10/23/19 0739 10/23/19 1149  GLUCAP 94 105* 161*   Lipid Profile: No results for input(s): CHOL, HDL, LDLCALC, TRIG, CHOLHDL, LDLDIRECT in the last 72 hours. Thyroid Function Tests: No results for input(s): TSH, T4TOTAL, FREET4, T3FREE, THYROIDAB in the last 72 hours. Anemia Panel: No results for input(s): VITAMINB12, FOLATE, FERRITIN, TIBC, IRON, RETICCTPCT in the last 72 hours. Sepsis Labs: No results for input(s): PROCALCITON, LATICACIDVEN in the last 168 hours.  Recent Results (from the past 240 hour(s))  SARS Coronavirus 2 by RT PCR (hospital order, performed in [facility] hospital lab) Nasopharyngeal Nasopharyngeal Swab     Status: None   Collection Time: 10/22/19 12:55 PM   Specimen: Nasopharyngeal Swab  Result Value Ref Range Status   SARS Coronavirus 2 NEGATIVE  NEGATIVE Final    Comment: (NOTE) SARS-CoV-2 target nucleic acids are NOT DETECTED. The SARS-CoV-2 RNA is generally detectable in upper and lower respiratory specimens during the acute phase of infection. The lowest concentration of SARS-CoV-2 viral copies this assay can detect is 250 copies / mL. A negative result does not preclude SARS-CoV-2 infection and should not be used as the sole basis for treatment or other patient management decisions.  A negative result may occur with improper specimen collection / handling, submission of specimen other than nasopharyngeal swab, presence of viral mutation(s) within the areas targeted by this assay, and inadequate number of viral copies (<250 copies / mL). A negative result must be combined with clinical observations, patient history, and epidemiological information. Fact Sheet for Patients:   StrictlyIdeas.no Fact Sheet for Healthcare Providers: BankingDealers.co.za This test is not yet approved or cleared  by the [place] FDA and has been authorized for detection and/or diagnosis of SARS-CoV-2 by FDA under an Emergency Use Authorization (EUA).  This EUA will remain in effect (meaning this test can be used) for the duration of the COVID-19 declaration under Section 564(b)(1) of the Act, 21 U.S.C. section 360bbb-3(b)(1), unless the authorization is terminated or revoked sooner. Performed at [facility], [address] [place], [place] [address]       Imaging Studies   US Pelvis Limited  Result Date: 10/22/2019 CLINICAL DATA:  74 year old male with a history of urinary frequency EXAM: LIMITED ULTRASOUND OF PELVIS TECHNIQUE: Limited transabdominal ultrasound examination of the pelvis was performed. COMPARISON:  None. FINDINGS: Urinary bladder measures 440 cc prevoid, 403 cc post void. Prostate volume estimated 15 cc. IMPRESSION: Significant post void residual indicating urinary  retention. Prostate volume estimated 15 cc. Electronically Signed   By: Corrie Mckusick D.O.   On: 10/22/2019 12:24   DG Chest Portable 1 View  Result Date: 10/22/2019 CLINICAL DATA:  Productive cough for 4 days with congestion and watery eyes. Current smoker. History of diabetes. EXAM: PORTABLE CHEST 1 VIEW COMPARISON:  Chest radiographs 06/12/2018 and 03/21/2018. FINDINGS: 1141 hours. Dialysis catheter has been removed in the interval. The visualized heart size and mediastinal contours are stable with mild aortic atherosclerosis. There is a new moderate-sized left pleural effusion with associated left basilar pulmonary opacity. No significant right pleural effusion. No pneumothorax. The pulmonary vasculature is slightly indistinct, and  there may be mild pulmonary edema. No significant osseous findings. IMPRESSION: New moderate-sized left pleural effusion with associated left basilar atelectasis or infiltrate. Possible mild pulmonary edema. Electronically Signed   By: Richardean Sale M.D.   On: 10/22/2019 12:18     Medications   Scheduled Meds: . allopurinol  100 mg Oral Daily  . amLODipine  10 mg Oral Daily  . aspirin EC  81 mg Oral Daily  . atorvastatin  10 mg Oral q1800  . calcium acetate  1,334 mg Oral TID WC  . Chlorhexidine Gluconate Cloth  6 each Topical Q0600  . cloNIDine  0.2 mg Oral BID  . clopidogrel  75 mg Oral Daily  . dextromethorphan-guaiFENesin  1 tablet Oral BID  . escitalopram  10 mg Oral Daily  . heparin  5,000 Units Subcutaneous Q8H  . hydrALAZINE  25 mg Oral Q8H  . insulin aspart  0-5 Units Subcutaneous QHS  . insulin aspart  0-9 Units Subcutaneous TID WC  . nicotine  21 mg Transdermal Daily  . tamsulosin  0.4 mg Oral Daily   Continuous Infusions:     LOS: 0 days    Time spent: 30 minutes    Ezekiel Slocumb, DO [facility]  10/23/2019, 4:25 PM    If 7PM-7AM, please contact night-coverage. How to contact the [facility] Attending or Consulting provider  [place] or covering provider during after hours [place], for this patient?    1. Check the care team in [facility] and look for a) attending/consulting TR[facility] provider listed and b) the [facility] team listed 2. Log into [email or web address] and use [place]'s universal password to access. If you do not have the password, please contact the hospital operator. 3. Locate the [facility] provider you are looking for under [facility] and page to a number that you can be directly reached. 4. If you still have difficulty reaching the provider, please page the [facility] (Director on Call) for the Hospitalists listed on amion for assistance.

## 2019-10-23 NOTE — TOC Initial Note (Signed)
Transition of Care [facility]) - Initial/Assessment Note    Patient Details  Name: Jose [name] MRN: [id number] Date of Birth: 12/26/1945  Transition of Care [facility]) CM/SW Contact:    Meriel Flavors, LCSW Phone Number: 10/23/2019, 5:07 PM  Clinical Narrative:                 Patient from home to ER for pulmonary edema, likely discharge back home/self. No TOC needs identified at this time        Patient Goals and CMS Choice        Expected Discharge Plan and Services                                                Prior Living Arrangements/Services                       Activities of Daily Living Home Assistive Devices/Equipment: None ADL Screening (condition at time of admission) Patient's cognitive ability adequate to safely complete daily activities?: Yes Is the patient deaf or have difficulty hearing?: No Does the patient have difficulty seeing, even when wearing glasses/contacts?: No Does the patient have difficulty concentrating, remembering, or making decisions?: No Patient able to express need for assistance with ADLs?: Yes Does the patient have difficulty dressing or bathing?: No Independently performs ADLs?: Yes (appropriate for developmental age) Does the patient have difficulty walking or climbing stairs?: No Weakness of Legs: None Weakness of Arms/Hands: None  Permission Sought/Granted                  Emotional Assessment              Admission diagnosis:  Pulmonary edema [J81.1] Pleural effusion, left [J90] Patient Active Problem List   Diagnosis Date Noted  . Pulmonary edema 10/22/2019  . Diarrhea 10/22/2019  . Acute on chronic diastolic CHF (congestive heart failure) ([place]) 10/22/2019  . HLD (hyperlipidemia)   . Stroke ([place])   . Depression   . Tobacco abuse   . Pleural effusion on left   . Acute urinary retention   . Foot ulcer ([place]) 12/15/2018  . Ischemic foot 10/11/2018  . Atherosclerosis of native arteries  of the extremities with gangrene ([place]) 07/09/2018  . Gangrene of toe of right foot ([place]) 06/13/2018  . Type II diabetes mellitus with renal manifestations ([place]) 04/16/2018  . Hypertension 04/16/2018  . ESRD on dialysis ([place]) 04/16/2018  . Malnutrition of moderate degree 03/27/2018  . Renal failure 03/21/2018   PCP:  [name], MD Pharmacy:   [id number] [place], [address] [place] [address] Phone: [phone] Fax: [phone]     Social Determinants of Health (SDOH) Interventions    Readmission Risk Interventions Readmission Risk Prevention Plan 12/20/2018 10/15/2018 10/14/2018  Transportation Screening Complete Complete Complete  PCP or Specialist Appt within 3-5 Days - (No Data) -  [place] or Home Care Consult Complete - Complete  Social Work Consult for [place] Planning/Counseling - - Not Complete  Palliative Care Screening - - Not Applicable  Medication Review Press photographer) Complete - Complete  Some recent data might be hidden

## 2019-10-24 DIAGNOSIS — N186 End stage renal disease: Secondary | ICD-10-CM

## 2019-10-24 DIAGNOSIS — Z992 Dependence on renal dialysis: Secondary | ICD-10-CM

## 2019-10-24 LAB — CBC WITH DIFFERENTIAL/PLATELET
Abs Immature Granulocytes: 0.05 10*3/uL (ref 0.00–0.07)
Basophils Absolute: 0 10*3/uL (ref 0.0–0.1)
Basophils Relative: 0 %
Eosinophils Absolute: 0.1 10*3/uL (ref 0.0–0.5)
Eosinophils Relative: 1 %
HCT: 35 % — ABNORMAL LOW (ref 39.0–52.0)
Hemoglobin: 11.7 g/dL — ABNORMAL LOW (ref 13.0–17.0)
Immature Granulocytes: 1 %
Lymphocytes Relative: 15 %
Lymphs Abs: 1.6 10*3/uL (ref 0.7–4.0)
MCH: 33.3 pg (ref 26.0–34.0)
MCHC: 33.4 g/dL (ref 30.0–36.0)
MCV: 99.7 fL (ref 80.0–100.0)
Monocytes Absolute: 1 10*3/uL (ref 0.1–1.0)
Monocytes Relative: 9 %
Neutro Abs: 7.9 10*3/uL — ABNORMAL HIGH (ref 1.7–7.7)
Neutrophils Relative %: 74 %
Platelets: 161 10*3/uL (ref 150–400)
RBC: 3.51 MIL/uL — ABNORMAL LOW (ref 4.22–5.81)
RDW: 15.5 % (ref 11.5–15.5)
WBC: 10.7 10*3/uL — ABNORMAL HIGH (ref 4.0–10.5)
nRBC: 0 % (ref 0.0–0.2)

## 2019-10-24 LAB — BASIC METABOLIC PANEL
Anion gap: 9 (ref 5–15)
BUN: 38 mg/dL — ABNORMAL HIGH (ref 8–23)
CO2: 28 mmol/L (ref 22–32)
Calcium: 8.3 mg/dL — ABNORMAL LOW (ref 8.9–10.3)
Chloride: 98 mmol/L (ref 98–111)
Creatinine, Ser: 5.96 mg/dL — ABNORMAL HIGH (ref 0.61–1.24)
GFR calc Af Amer: 10 mL/min — ABNORMAL LOW (ref >60)
GFR calc non Af Amer: 9 mL/min — ABNORMAL LOW (ref >60)
Glucose, Bld: 125 mg/dL — ABNORMAL HIGH (ref 70–99)
Potassium: 3.5 mmol/L (ref 3.5–5.1)
Sodium: 135 mmol/L (ref 135–145)

## 2019-10-24 LAB — MAGNESIUM: Magnesium: 1.9 mg/dL (ref 1.7–2.4)

## 2019-10-24 LAB — GLUCOSE, CAPILLARY
Glucose-Capillary: 116 mg/dL — ABNORMAL HIGH (ref 70–99)
Glucose-Capillary: 159 mg/dL — ABNORMAL HIGH (ref 70–99)

## 2019-10-24 MED ORDER — ALLOPURINOL 100 MG PO TABS: 100.0000 mg | ORAL_TABLET | Freq: Every day | ORAL | 2 refills | Status: DC

## 2019-10-24 MED ORDER — ESCITALOPRAM OXALATE 10 MG PO TABS: 10.0000 mg | ORAL_TABLET | Freq: Every day | ORAL | 2 refills | Status: AC

## 2019-10-24 MED ORDER — AMLODIPINE BESYLATE 10 MG PO TABS: 10.0000 mg | ORAL_TABLET | Freq: Every day | ORAL | 2 refills | Status: DC

## 2019-10-24 MED ORDER — HYDRALAZINE HCL 25 MG PO TABS: 25.0000 mg | ORAL_TABLET | Freq: Three times a day (TID) | ORAL | 2 refills | Status: DC

## 2019-10-24 MED ORDER — GLIPIZIDE 5 MG PO TABS: 5.0000 mg | ORAL_TABLET | Freq: Every day | ORAL | 2 refills | Status: DC

## 2019-10-24 MED ORDER — TAMSULOSIN HCL 0.4 MG PO CAPS: 0.4000 mg | ORAL_CAPSULE | Freq: Every day | ORAL | 2 refills | Status: DC

## 2019-10-24 MED ORDER — CLONIDINE HCL 0.2 MG PO TABS: 0.2000 mg | ORAL_TABLET | Freq: Two times a day (BID) | ORAL | 2 refills | Status: DC

## 2019-10-24 MED ORDER — CLOPIDOGREL BISULFATE 75 MG PO TABS: 75.0000 mg | ORAL_TABLET | Freq: Every day | ORAL | 2 refills | Status: DC

## 2019-10-24 MED ORDER — ASPIRIN 81 MG PO TBEC: 81.0000 mg | DELAYED_RELEASE_TABLET | Freq: Every day | ORAL | 2 refills | Status: AC

## 2019-10-24 MED ORDER — ATORVASTATIN CALCIUM 10 MG PO TABS: 10.0000 mg | ORAL_TABLET | Freq: Every day | ORAL | 2 refills | Status: DC

## 2019-10-24 NOTE — Care Management (Signed)
[name] dialysis liaison notified of discharge  

## 2019-10-24 NOTE — Care Management (Signed)
At discharge patient notified bedside RN that he has trouble obtaining his medications.   RNCM met with patient at bedside.  States that previously he had medication coverage through [facility].  RNCM confirmed [facility] plan is no longer active.  Per Epic patient has Medicare Part A and B.  No part D coverage is shown.  Patient is unaware of and medication coverage.    Bedside RN checked with patient's pharmacy.  They also do not have any insurance on file, and only show a discount card,  RNCM reached out to [facility]ication [facility] .  They will be able to assist patient with his medications.  They will not be able to fill physlo or tramadol.  Patient and MD notified.  MD has electronically sent prescriptions to [facility] .  Patient to pick up after discharge.  RNCM provided application to [facility] , and also provided written inscriptions for where to pick up his medications.

## 2019-10-24 NOTE — Progress Notes (Signed)
Jose [name]  MRN: [id number]  DOB/AGE: 1945-12-06 74 y.o.  Primary Care Physician:[name], Jose Rinks, MD  Admit date: 10/22/2019  Chief Complaint:  Chief Complaint  Patient presents with  . Cough  . Polyuria    S-Pt presented on  10/22/2019 with  Chief Complaint  Patient presents with  . Cough  . Polyuria  . Denies any acute complaints today Able to eat without nausea or vomiting   Medications   . allopurinol  100 mg Oral Daily  . amLODipine  10 mg Oral Daily  . aspirin EC  81 mg Oral Daily  . atorvastatin  10 mg Oral q1800  . calcium acetate  1,334 mg Oral TID WC  . cloNIDine  0.2 mg Oral BID  . clopidogrel  75 mg Oral Daily  . dextromethorphan-guaiFENesin  1 tablet Oral BID  . escitalopram  10 mg Oral Daily  . heparin  5,000 Units Subcutaneous Q8H  . hydrALAZINE  25 mg Oral Q8H  . insulin aspart  0-5 Units Subcutaneous QHS  . insulin aspart  0-9 Units Subcutaneous TID WC  . nicotine  21 mg Transdermal Daily  . tamsulosin  0.4 mg Oral Daily     Physical Exam: Vital signs in last 24 hours: Temp:  [97.8 F (36.6 C)-98.1 F (36.7 C)] 98.1 F (36.7 C) (05/24 1439) Pulse Rate:  [54-75] 72 (05/24 1439) Resp:  [16-20] 16 (05/24 1439) BP: (107-123)/(57-71) 111/69 (05/24 1439) SpO2:  [92 %-95 %] 95 % (05/24 1439) Weight:  [54 kg] 54 kg (05/24 0403) Weight change: -7.236 kg Last BM Date: 10/23/19  Intake/Output from previous day: 05/23 0701 - 05/24 0700 In: 240 [P.O.:240] Out: 100 [Urine:100] Total I/O In: 240 [P.O.:240] Out: -    Physical Exam: General- pt is awake,alert, oriented to time place and person Resp- No acute Resp distress, decreased Bs at bases. CVS- S1S2 regular in rate and rhythm GI - BS+, soft, NT, ND EXT-no Cyanosis Access- left AVF +   Lab Results: CBC Recent Labs    10/23/19 0607 10/24/19 0418  WBC 11.4* 10.7*  HGB 12.9* 11.7*  HCT 37.9* 35.0*  PLT 152 161    BMET Recent Labs    10/23/19 0607 10/24/19 0418  NA 138 135   K 3.5 3.5  CL 98 98  CO2 27 28  GLUCOSE 106* 125*  BUN 26* 38*  CREATININE 4.42* 5.96*  CALCIUM 8.4* 8.3*    MICRO Recent Results (from the past 240 hour(s))  SARS Coronavirus 2 by RT PCR (hospital order, performed in [facility] hospital lab) Nasopharyngeal Nasopharyngeal Swab     Status: None   Collection Time: 10/22/19 12:55 PM   Specimen: Nasopharyngeal Swab  Result Value Ref Range Status   SARS Coronavirus 2 NEGATIVE NEGATIVE Final    Comment: (NOTE) SARS-CoV-2 target nucleic acids are NOT DETECTED. The SARS-CoV-2 RNA is generally detectable in upper and lower respiratory specimens during the acute phase of infection. The lowest concentration of SARS-CoV-2 viral copies this assay can detect is 250 copies / mL. A negative result does not preclude SARS-CoV-2 infection and should not be used as the sole basis for treatment or other patient management decisions.  A negative result may occur with improper specimen collection / handling, submission of specimen other than nasopharyngeal swab, presence of viral mutation(s) within the areas targeted by this assay, and inadequate number of viral copies (<250 copies / mL). A negative result must be combined with clinical observations, patient history, and epidemiological information.  Fact Sheet for Patients:   StrictlyIdeas.no Fact Sheet for Healthcare Providers: BankingDealers.co.za This test is not yet approved or cleared  by the [place] FDA and has been authorized for detection and/or diagnosis of SARS-CoV-2 by FDA under an Emergency Use Authorization (EUA).  This EUA will remain in effect (meaning this test can be used) for the duration of the COVID-19 declaration under Section 564(b)(1) of the Act, 21 U.S.C. section 360bbb-3(b)(1), unless the authorization is terminated or revoked sooner. Performed at [facility], [address] [place], [place] [address]    MRSA PCR Screening     Status: None   Collection Time: 10/23/19  8:13 PM   Specimen: Nasal Mucosa; Nasopharyngeal  Result Value Ref Range Status   MRSA by PCR NEGATIVE NEGATIVE Final    Comment:        The GeneXpert MRSA Assay (FDA approved for NASAL specimens only), is one component of a comprehensive MRSA colonization surveillance program. It is not intended to diagnose MRSA infection nor to guide or monitor treatment for MRSA infections. Performed at [facility], [address] [place], [place] [address]       Lab Results  Component Value Date   PTH 226 (H) 03/25/2018   CALCIUM 8.3 (L) 10/24/2019   PHOS 4.4 12/16/2018     Impression: Patient is a 74 year old African-American male with a past medical history of end-stage renal disease, diabetes mellitus, history of prostate cancer, history of CVA, history of peripheral vascular disease, history of bilateral transmetatarsal amputation who was admitted with new pleural effusion, pulmonary edema and urinary retention  [address] [facility] 225 minutes/Target weight 66.5 kg/left arm AV fistula  #End-stage renal disease Patient will be scheduled for hemodialysis on Tuesday If discharged, he will continue dialysis as outpatient  #Anemia of chronic kidney disease. Lab Results  Component Value Date   HGB 11.7 (L) 10/24/2019     #Secondary hyperparathyroidism Lab Results  Component Value Date   PTH 226 (H) 03/25/2018   CALCIUM 8.3 (L) 10/24/2019   PHOS 4.4 12/16/2018  Low phosphorus diet Monitor calcium and phosphorus during this admission   #Urinary retention He has been started on Flomax Follow-up as outpatient    Jose [name] 10/24/2019, 3:46 PM

## 2019-10-24 NOTE — Progress Notes (Signed)
Patient's son's girlfriend picked the patient up. Instructions reviewed with her. She was instructed to go to [facility]ication [facility] to pick up the patients med.

## 2019-10-24 NOTE — Discharge Summary (Addendum)
Physician Discharge Summary  Jose [name] ERX:[id number] DOB: 07-27-1945 DOA: 10/22/2019  PCP: [name], MD  Admit date: 10/22/2019 Discharge date: 10/25/2019  Admitted From: home Disposition:  home  Recommendations for Outpatient Follow-up:  1. Follow up with PCP in 1-2 weeks 2. Please obtain BMP/CBC in one week 3. Patient was started on Flomax during admission.  He presented with acute urinary retention, Foley was placed.  Voiding trial was unable to be completed, because patient makes so little urine.  After Foley removed morning of 5/23, he only has 168cc in bladder at time of discharge.  Consider urology consult if indicated.  Home Health: No  Equipment/Devices: None   Discharge Condition: Stable  CODE STATUS: Full  Diet recommendation: Renal with 1200 cc/day fluid restriction  Discharge Diagnoses: Principal Problem:   Pulmonary edema Active Problems:   Type II diabetes mellitus with renal manifestations (HCC)   Hypertension   ESRD on dialysis ([place])   HLD (hyperlipidemia)   Stroke (HCC)   Depression   Tobacco abuse   Pleural effusion on left   Acute urinary retention   Diarrhea   Acute on chronic diastolic CHF (congestive heart failure) (HCC)    Summary of HPI and Hospital Course:   Jose [name] is a 74 y.o. male with medical history of ESRD-HD (TTS), hypertension, hyperlipidemia, diabetes mellitus, stroke (unknown type), gout, depression, prostate cancer, tobacco abuse, dCHF, who presented to the ED on Saturday 10/22/19 with productive cough, shortness breath.  Last dialysis was Thursday per his usual schedule.  He reported polyuria on admission, subsequently found with ~400 cc post-void bladder residual consistent with urinary retention.  He also reported a few episodes of watery diarrhea on the day of presentation.  No fevers, chills, dysuria or other acute complaints.  In the ED, hypertensive 170/80 with otherwise normal vitals.  Labs showed mild leukocytosis  10.7k, UA turbid with large leukocytes, negative bacteria, no wbc's), K 3.2, Cr 6.95, BUN 49 consistent with ESRD.  Patient admitted to hospitalist service with nephrology consulted.  He received dialysis on 5/22 PM with improvement in respiratory symptoms.        Pulmonary edemaand acute on chronic diastolic CHF - Resolved.  Present on admission with shortness of breath most likely due to pulmonary edema and CHF exacerbation / volume overload.  2D echo on 03/22/2018 showed EF 55 to 60% with grade 2 diastolic dysfunction.  Symptoms resolved after dialysis on night of admission.   --Nephrology consulted  Acute urinary retention - urinary frequency reported on admission likely due to overflow incontinence secondary to acute urinary retention.  Had post-void residuals on u/s.  Foley was placed.  Patient denies history of lower urinary tract symptoms to suggest BPH. He is ESRD and oliguric.  Foley was removed, patient did not void prior to d/c but only had 168cc on bladder scan.  Flomax was started.  Patient agreed with PCP or urology follow up as needed.  Likely has BPH.  Diarrhea - resolved without intervention.  C diff and GI panel not sent bc he had no BM in first 24 hours.    Type II diabetes mellitus with renal manifestations - Most recent A1c5.6,well controlled.  Treated with Sliding scale Novolog and CBG's AC/HS.  Glipizide was resumed on d/c.  Hypertension - chronic, uncontrolled on admission in setting of volume overload.  Improved after dialysis.  Continue home amlodipine, clonidine, hydralazine.    ESRD on dialysis - Nephrology following.  ESRD schedule T/Th/S.  Dialyzed on  admission.  Hyperlipidemia - continue Lipitor  Stroke - continue aspirin, Lipitor,Plavix  Depression - continue Lexapro  Tobacco abuse - Nicotine patch  Pleural effusion on left - Likely due to ESREDand CHF, volume overload.   No hypoxia. Will re-evaluate with xray if SOB worsens again.     Discharge Instructions   Discharge Instructions    (HEART FAILURE PATIENTS) Call MD:  Anytime you have any of the following symptoms: 1) 3 pound weight gain in 24 hours or 5 pounds in 1 week 2) shortness of breath, with or without a dry hacking cough 3) swelling in the hands, feet or stomach 4) if you have to sleep on extra pillows at night in order to breathe.   Complete by: As directed    Call MD for:  extreme fatigue   Complete by: As directed    Call MD for:  temperature >100.4   Complete by: As directed    Diet - low sodium heart healthy   Complete by: As directed    Discharge instructions   Complete by: As directed    Please take your medications as prescribed, this is very important for your health.  I started Flomax (tamsulosin) to help with urinating.  You may have an enlarged prostate that make emptying your bladder difficult.  This medicine should help with that.  If you are not able to urinate like you normally do, or you feel your bladder is getting full or distended, please call your doctor.  If you have a urologist, please contact them for follow up appointment.   Increase activity slowly   Complete by: As directed      Allergies as of 10/24/2019      Reactions   Penicillin G Other (See Comments)   Other reaction(s): Unknown DID THE REACTION INVOLVE: Swelling of the face/tongue/throat, SOB, or low BP? Unknown Sudden or severe rash/hives, skin peeling, or the inside of the mouth or nose? Unknown Did it require medical treatment? Unknown When did it last happen?unknown If all above answers are "NO", may proceed with cephalosporin use.      Medication List    STOP taking these medications   docusate sodium 100 MG capsule Commonly known as: COLACE   gabapentin 300 MG capsule Commonly known as: NEURONTIN     TAKE these medications   allopurinol 100 MG tablet Commonly known as: ZYLOPRIM Take 1 tablet (100 mg total) by mouth daily.   amLODipine 10 MG  tablet Commonly known as: NORVASC Take 1 tablet (10 mg total) by mouth daily.   aspirin 81 MG EC tablet Take 1 tablet (81 mg total) by mouth daily.   atorvastatin 10 MG tablet Commonly known as: LIPITOR Take 1 tablet (10 mg total) by mouth daily at 6 PM.   calcium acetate 667 MG capsule Commonly known as: PHOSLO Take 2 capsules (1,334 mg total) by mouth 3 (three) times daily with meals.   cloNIDine 0.2 MG tablet Commonly known as: CATAPRES Take 1 tablet (0.2 mg total) by mouth 2 (two) times daily.   clopidogrel 75 MG tablet Commonly known as: PLAVIX Take 1 tablet (75 mg total) by mouth daily.   escitalopram 10 MG tablet Commonly known as: Lexapro Take 1 tablet (10 mg total) by mouth daily.   glipiZIDE 5 MG tablet Commonly known as: GLUCOTROL Take 1 tablet (5 mg total) by mouth daily before breakfast.   hydrALAZINE 25 MG tablet Commonly known as: APRESOLINE Take 1 tablet (25 mg total) by mouth  every 8 (eight) hours.   tamsulosin 0.4 MG Caps capsule Commonly known as: FLOMAX Take 1 capsule (0.4 mg total) by mouth daily.   traMADol 50 MG tablet Commonly known as: ULTRAM 50 mg.      Follow-up Information    [facility] Follow up on 10/28/2019.   Specialty: Cardiology Why: at 1:00pm. Enter through the [facility] entrance Contact information: [address] [place] [phone]       [name], MD. Call on 10/30/2019.   Specialty: Family Medicine Why: office will call patient back.  Contact information: [place] [address] [phone]          Allergies  Allergen Reactions  . Penicillin G Other (See Comments)    Other reaction(s): Unknown DID THE REACTION INVOLVE: Swelling of the face/tongue/throat, SOB, or low BP? Unknown Sudden or severe rash/hives, skin peeling, or the inside of the mouth or nose? Unknown Did it require medical treatment?  Unknown When did it last happen?unknown If all above answers are "NO", may proceed with cephalosporin use.     Consultations:  Nephrology    Procedures/Studies: US Pelvis Limited  Result Date: 10/22/2019 CLINICAL DATA:  74 year old male with a history of urinary frequency EXAM: LIMITED ULTRASOUND OF PELVIS TECHNIQUE: Limited transabdominal ultrasound examination of the pelvis was performed. COMPARISON:  None. FINDINGS: Urinary bladder measures 440 cc prevoid, 403 cc post void. Prostate volume estimated 15 cc. IMPRESSION: Significant post void residual indicating urinary retention. Prostate volume estimated 15 cc. Electronically Signed   By: Corrie Mckusick D.O.   On: 10/22/2019 12:24   DG Chest Portable 1 View  Result Date: 10/22/2019 CLINICAL DATA:  Productive cough for 4 days with congestion and watery eyes. Current smoker. History of diabetes. EXAM: PORTABLE CHEST 1 VIEW COMPARISON:  Chest radiographs 06/12/2018 and 03/21/2018. FINDINGS: 1141 hours. Dialysis catheter has been removed in the interval. The visualized heart size and mediastinal contours are stable with mild aortic atherosclerosis. There is a new moderate-sized left pleural effusion with associated left basilar pulmonary opacity. No significant right pleural effusion. No pneumothorax. The pulmonary vasculature is slightly indistinct, and there may be mild pulmonary edema. No significant osseous findings. IMPRESSION: New moderate-sized left pleural effusion with associated left basilar atelectasis or infiltrate. Possible mild pulmonary edema. Electronically Signed   By: Richardean Sale M.D.   On: 10/22/2019 12:18      Dialysis    Subjective: Patient seen at bedside.  He denies any acute complaints.  No fever, chills, abdominal or suprapubic tenderness.  No diarrhea.  No SOB or CP.     Discharge Exam: Vitals:   10/24/19 0813 10/24/19 1439  BP: 118/71 111/69  Pulse: 75 72  Resp:  16  Temp:  98.1 F (36.7 C)  SpO2:   95%   Vitals:   10/24/19 0403 10/24/19 0454 10/24/19 0813 10/24/19 1439  BP:  123/67 118/71 111/69  Pulse:  72 75 72  Resp:  16  16  Temp:  97.9 F (36.6 C)  98.1 F (36.7 C)  TempSrc:  Oral  Oral  SpO2:  94%  95%  Weight: 54 kg     Height:        General: Pt is alert, awake, not in acute distress, frail Cardiovascular: RRR, S1/S2 +, no rubs, no gallops Respiratory: CTA bilaterally, no wheezing, no rhonchi Abdominal: Soft, NT, ND, bowel sounds + Extremities: no edema, no cyanosis  The results of significant diagnostics from this hospitalization (including imaging, microbiology, ancillary and laboratory) are listed below for reference.     Microbiology: Recent Results (from the past 240 hour(s))  SARS Coronavirus 2 by RT PCR (hospital order, performed in [facility] hospital lab) Nasopharyngeal Nasopharyngeal Swab     Status: None   Collection Time: 10/22/19 12:55 PM   Specimen: Nasopharyngeal Swab  Result Value Ref Range Status   SARS Coronavirus 2 NEGATIVE NEGATIVE Final    Comment: (NOTE) SARS-CoV-2 target nucleic acids are NOT DETECTED. The SARS-CoV-2 RNA is generally detectable in upper and lower respiratory specimens during the acute phase of infection. The lowest concentration of SARS-CoV-2 viral copies this assay can detect is 250 copies / mL. A negative result does not preclude SARS-CoV-2 infection and should not be used as the sole basis for treatment or other patient management decisions.  A negative result may occur with improper specimen collection / handling, submission of specimen other than nasopharyngeal swab, presence of viral mutation(s) within the areas targeted by this assay, and inadequate number of viral copies (<250 copies / mL). A negative result must be combined with clinical observations, patient history, and epidemiological information. Fact Sheet for Patients:   StrictlyIdeas.no Fact Sheet for Healthcare  Providers: BankingDealers.co.za This test is not yet approved or cleared  by the [place] FDA and has been authorized for detection and/or diagnosis of SARS-CoV-2 by FDA under an Emergency Use Authorization (EUA).  This EUA will remain in effect (meaning this test can be used) for the duration of the COVID-19 declaration under Section 564(b)(1) of the Act, 21 U.S.C. section 360bbb-3(b)(1), unless the authorization is terminated or revoked sooner. Performed at [facility], [address] [place], [place] [address]   MRSA PCR Screening     Status: None   Collection Time: 10/23/19  8:13 PM   Specimen: Nasal Mucosa; Nasopharyngeal  Result Value Ref Range Status   MRSA by PCR NEGATIVE NEGATIVE Final    Comment:        The GeneXpert MRSA Assay (FDA approved for NASAL specimens only), is one component of a comprehensive MRSA colonization surveillance program. It is not intended to diagnose MRSA infection nor to guide or monitor treatment for MRSA infections. Performed at [facility], [address] [place], [place] [address]      Labs: BNP (last 3 results) No results for input(s): BNP in the last 8760 hours. Basic Metabolic Panel: Recent Labs  Lab 10/22/19 1419 10/23/19 0607 10/24/19 0418  NA 141 138 135  K 3.2* 3.5 3.5  CL 100 98 98  CO2 26 27 28   GLUCOSE 92 106* 125*  BUN 49* 26* 38*  CREATININE 6.95* 4.42* 5.96*  CALCIUM 8.3* 8.4* 8.3*  MG  --   --  1.9   Liver Function Tests: No results for input(s): AST, ALT, ALKPHOS, BILITOT, PROT, ALBUMIN in the last 168 hours. No results for input(s): LIPASE, AMYLASE in the last 168 hours. No results for input(s): AMMONIA in the last 168 hours. CBC: Recent Labs  Lab 10/22/19 1419 10/23/19 0607 10/24/19 0418  WBC 10.9* 11.4* 10.7*  NEUTROABS 7.4  --  7.9*  HGB 13.3 12.9* 11.7*  HCT 39.5 37.9* 35.0*  MCV 98.8 97.2 99.7  PLT 168 152 161   Cardiac Enzymes: No results  for input(s): CKTOTAL, CKMB, CKMBINDEX, TROPONINI in the last 168 hours. BNP: Invalid input(s): POCBNP CBG: Recent Labs  Lab 10/23/19 1149 10/23/19 1641 10/23/19 2224 10/24/19  6283 10/24/19 1144  GLUCAP 161* 100* 144* 116* 159*   D-Dimer No results for input(s): DDIMER in the last 72 hours. Hgb A1c No results for input(s): HGBA1C in the last 72 hours. Lipid Profile No results for input(s): CHOL, HDL, LDLCALC, TRIG, CHOLHDL, LDLDIRECT in the last 72 hours. Thyroid function studies No results for input(s): TSH, T4TOTAL, T3FREE, THYROIDAB in the last 72 hours.  Invalid input(s): FREET3 Anemia work up No results for input(s): VITAMINB12, FOLATE, FERRITIN, TIBC, IRON, RETICCTPCT in the last 72 hours. Urinalysis    Component Value Date/Time   COLORURINE YELLOW (A) 10/22/2019 1218   APPEARANCEUR TURBID (A) 10/22/2019 1218   LABSPEC 1.014 10/22/2019 1218   PHURINE 6.0 10/22/2019 1218   GLUCOSEU NEGATIVE 10/22/2019 1218   HGBUR SMALL (A) 10/22/2019 1218   BILIRUBINUR NEGATIVE 10/22/2019 1218   [place] 10/22/2019 1218   PROTEINUR 100 (A) 10/22/2019 1218   NITRITE NEGATIVE 10/22/2019 1218   LEUKOCYTESUR LARGE (A) 10/22/2019 1218   Sepsis Labs Invalid input(s): PROCALCITONIN,  WBC,  LACTICIDVEN Microbiology Recent Results (from the past 240 hour(s))  SARS Coronavirus 2 by RT PCR (hospital order, performed in [place] hospital lab) Nasopharyngeal Nasopharyngeal Swab     Status: None   Collection Time: 10/22/19 12:55 PM   Specimen: Nasopharyngeal Swab  Result Value Ref Range Status   SARS Coronavirus 2 NEGATIVE NEGATIVE Final    Comment: (NOTE) SARS-CoV-2 target nucleic acids are NOT DETECTED. The SARS-CoV-2 RNA is generally detectable in upper and lower respiratory specimens during the acute phase of infection. The lowest concentration of SARS-CoV-2 viral copies this assay can detect is 250 copies / mL. A negative result does not preclude SARS-CoV-2  infection and should not be used as the sole basis for treatment or other patient management decisions.  A negative result may occur with improper specimen collection / handling, submission of specimen other than nasopharyngeal swab, presence of viral mutation(s) within the areas targeted by this assay, and inadequate number of viral copies (<250 copies / mL). A negative result must be combined with clinical observations, patient history, and epidemiological information. Fact Sheet for Patients:   StrictlyIdeas.no Fact Sheet for Healthcare Providers: BankingDealers.co.za This test is not yet approved or cleared  by the [place] FDA and has been authorized for detection and/or diagnosis of SARS-CoV-2 by FDA under an Emergency Use Authorization (EUA).  This EUA will remain in effect (meaning this test can be used) for the duration of the COVID-19 declaration under Section 564(b)(1) of the Act, 21 U.S.C. section 360bbb-3(b)(1), unless the authorization is terminated or revoked sooner. Performed at [facility], [address] [place], [place] [address]   MRSA PCR Screening     Status: None   Collection Time: 10/23/19  8:13 PM   Specimen: Nasal Mucosa; Nasopharyngeal  Result Value Ref Range Status   MRSA by PCR NEGATIVE NEGATIVE Final    Comment:        The GeneXpert MRSA Assay (FDA approved for NASAL specimens only), is one component of a comprehensive MRSA colonization surveillance program. It is not intended to diagnose MRSA infection nor to guide or monitor treatment for MRSA infections. Performed at [facility], [address] [place], [place] [address]      Time coordinating discharge: Over 30 minutes  SIGNED:   Ezekiel Slocumb, DO [facility] 10/25/2019, 5:10 PM   If 7PM-7AM, please contact night-coverage [email or web address]

## 2019-10-24 NOTE — Progress Notes (Signed)
Established hemodialysis patient known at [facility] T[facility] 12:00. Uses [facility] for transportation. Please call me with any placement related questions.  [name] Dialysis Coordinator [phone]

## 2019-10-26 ENCOUNTER — Telehealth: Payer: Self-pay | Admitting: Family

## 2019-10-26 NOTE — Telephone Encounter (Signed)
Left VM with patient family remember reminding him of his new patient CHF Clinic appointment and to see how he was doing. I was told he is doing ok and will be at appointment Friday.   Alyse Low, [place]

## 2019-10-28 ENCOUNTER — Telehealth: Payer: Self-pay | Admitting: Family

## 2019-10-28 ENCOUNTER — Ambulatory Visit: Payer: Medicare Other | Admitting: Family

## 2019-10-28 NOTE — Progress Notes (Deleted)
   Patient ID: Jose [name], male    DOB: May 21, 1946, 74 y.o.   MRN: [id number]  HPI  Jose [name] is a 74 y/o male with a history of  Echo report from 05/10/18 reviewed and showed an EF of 35% along with mild AR and moderate Jose.   Admitted 10/22/19 due to acute on chronic HF along with urinary retention. Nephrology consult obtained. Dialysis completed. Foley placed and flomax started with subsequent removal of catheter. Left pleural effusion noted. Discharged after 2 days.   He presents today for his initial visit with a chief complaint of   Review of Systems    Physical Exam    Assessment & Plan:  1: Chronic heart failure with reduced ejection fraction- - NYHA class - BNP 03/21/18 was 2596.0  2: HTN- - BP - saw PCP Kary Kos) 04/25/2019 - BMP 10/24/19 reviewed and showed sodium 135, potassium 3.5, creatinine 5.96 and GFR 10  3: DM- - A1c 12/15/2018 was 5.6%  4: ESRD on dialysis- - dialysis on Tues/ Thurs/ Sat  5: Tobacco use-

## 2019-10-28 NOTE — Telephone Encounter (Signed)
Patient did not show for his Heart Failure Clinic appointment on 10/28/19. Will attempt to reschedule.

## 2019-10-29 ENCOUNTER — Inpatient Hospital Stay: Payer: Medicare Other

## 2019-10-29 ENCOUNTER — Inpatient Hospital Stay
Admission: EM | Admit: 2019-10-29 | Discharge: 2019-11-05 | DRG: 291 | Disposition: A | Discharge status: Home Health | Payer: Medicare Other | Source: Ambulatory Visit | Attending: Internal Medicine | Admitting: Internal Medicine

## 2019-10-29 ENCOUNTER — Emergency Department: Payer: Medicare Other

## 2019-10-29 ENCOUNTER — Encounter: Payer: Self-pay | Admitting: Emergency Medicine

## 2019-10-29 ENCOUNTER — Other Ambulatory Visit: Payer: Self-pay

## 2019-10-29 DIAGNOSIS — C3412 Malignant neoplasm of upper lobe, left bronchus or lung: Secondary | ICD-10-CM | POA: Diagnosis present

## 2019-10-29 DIAGNOSIS — R0902 Hypoxemia: Secondary | ICD-10-CM

## 2019-10-29 DIAGNOSIS — Z8546 Personal history of malignant neoplasm of prostate: Secondary | ICD-10-CM | POA: Diagnosis not present

## 2019-10-29 DIAGNOSIS — E785 Hyperlipidemia, unspecified: Secondary | ICD-10-CM | POA: Diagnosis present

## 2019-10-29 DIAGNOSIS — Z79899 Other long term (current) drug therapy: Secondary | ICD-10-CM | POA: Diagnosis not present

## 2019-10-29 DIAGNOSIS — I10 Essential (primary) hypertension: Secondary | ICD-10-CM | POA: Diagnosis not present

## 2019-10-29 DIAGNOSIS — Z7984 Long term (current) use of oral hypoglycemic drugs: Secondary | ICD-10-CM | POA: Diagnosis not present

## 2019-10-29 DIAGNOSIS — C3490 Malignant neoplasm of unspecified part of unspecified bronchus or lung: Secondary | ICD-10-CM

## 2019-10-29 DIAGNOSIS — K828 Other specified diseases of gallbladder: Secondary | ICD-10-CM | POA: Diagnosis not present

## 2019-10-29 DIAGNOSIS — E1151 Type 2 diabetes mellitus with diabetic peripheral angiopathy without gangrene: Secondary | ICD-10-CM | POA: Diagnosis present

## 2019-10-29 DIAGNOSIS — Z8673 Personal history of transient ischemic attack (TIA), and cerebral infarction without residual deficits: Secondary | ICD-10-CM

## 2019-10-29 DIAGNOSIS — R64 Cachexia: Secondary | ICD-10-CM | POA: Diagnosis present

## 2019-10-29 DIAGNOSIS — H409 Unspecified glaucoma: Secondary | ICD-10-CM | POA: Diagnosis present

## 2019-10-29 DIAGNOSIS — E871 Hypo-osmolality and hyponatremia: Secondary | ICD-10-CM | POA: Diagnosis not present

## 2019-10-29 DIAGNOSIS — C801 Malignant (primary) neoplasm, unspecified: Secondary | ICD-10-CM | POA: Diagnosis present

## 2019-10-29 DIAGNOSIS — I5032 Chronic diastolic (congestive) heart failure: Secondary | ICD-10-CM | POA: Diagnosis present

## 2019-10-29 DIAGNOSIS — D631 Anemia in chronic kidney disease: Secondary | ICD-10-CM | POA: Diagnosis present

## 2019-10-29 DIAGNOSIS — J910 Malignant pleural effusion: Secondary | ICD-10-CM | POA: Diagnosis present

## 2019-10-29 DIAGNOSIS — E876 Hypokalemia: Secondary | ICD-10-CM | POA: Diagnosis present

## 2019-10-29 DIAGNOSIS — E118 Type 2 diabetes mellitus with unspecified complications: Secondary | ICD-10-CM | POA: Diagnosis not present

## 2019-10-29 DIAGNOSIS — E875 Hyperkalemia: Secondary | ICD-10-CM | POA: Diagnosis not present

## 2019-10-29 DIAGNOSIS — N186 End stage renal disease: Secondary | ICD-10-CM | POA: Diagnosis present

## 2019-10-29 DIAGNOSIS — J9601 Acute respiratory failure with hypoxia: Secondary | ICD-10-CM | POA: Diagnosis not present

## 2019-10-29 DIAGNOSIS — J90 Pleural effusion, not elsewhere classified: Secondary | ICD-10-CM | POA: Diagnosis not present

## 2019-10-29 DIAGNOSIS — Z880 Allergy status to penicillin: Secondary | ICD-10-CM

## 2019-10-29 DIAGNOSIS — N2581 Secondary hyperparathyroidism of renal origin: Secondary | ICD-10-CM | POA: Diagnosis present

## 2019-10-29 DIAGNOSIS — I132 Hypertensive heart and chronic kidney disease with heart failure and with stage 5 chronic kidney disease, or end stage renal disease: Principal | ICD-10-CM | POA: Diagnosis present

## 2019-10-29 DIAGNOSIS — M109 Gout, unspecified: Secondary | ICD-10-CM | POA: Diagnosis present

## 2019-10-29 DIAGNOSIS — Z681 Body mass index (BMI) 19 or less, adult: Secondary | ICD-10-CM | POA: Diagnosis not present

## 2019-10-29 DIAGNOSIS — R0602 Shortness of breath: Secondary | ICD-10-CM | POA: Diagnosis not present

## 2019-10-29 DIAGNOSIS — Z992 Dependence on renal dialysis: Secondary | ICD-10-CM | POA: Diagnosis not present

## 2019-10-29 DIAGNOSIS — R0600 Dyspnea, unspecified: Secondary | ICD-10-CM

## 2019-10-29 DIAGNOSIS — Z720 Tobacco use: Secondary | ICD-10-CM | POA: Diagnosis not present

## 2019-10-29 DIAGNOSIS — F17210 Nicotine dependence, cigarettes, uncomplicated: Secondary | ICD-10-CM | POA: Diagnosis present

## 2019-10-29 DIAGNOSIS — Z7982 Long term (current) use of aspirin: Secondary | ICD-10-CM | POA: Diagnosis not present

## 2019-10-29 DIAGNOSIS — K8020 Calculus of gallbladder without cholecystitis without obstruction: Secondary | ICD-10-CM | POA: Diagnosis present

## 2019-10-29 DIAGNOSIS — Z7902 Long term (current) use of antithrombotics/antiplatelets: Secondary | ICD-10-CM | POA: Diagnosis not present

## 2019-10-29 DIAGNOSIS — Z9889 Other specified postprocedural states: Secondary | ICD-10-CM | POA: Diagnosis not present

## 2019-10-29 DIAGNOSIS — C3492 Malignant neoplasm of unspecified part of left bronchus or lung: Secondary | ICD-10-CM | POA: Diagnosis not present

## 2019-10-29 DIAGNOSIS — E1169 Type 2 diabetes mellitus with other specified complication: Secondary | ICD-10-CM | POA: Diagnosis present

## 2019-10-29 DIAGNOSIS — E1122 Type 2 diabetes mellitus with diabetic chronic kidney disease: Secondary | ICD-10-CM | POA: Diagnosis present

## 2019-10-29 DIAGNOSIS — I5033 Acute on chronic diastolic (congestive) heart failure: Secondary | ICD-10-CM | POA: Diagnosis present

## 2019-10-29 DIAGNOSIS — E1129 Type 2 diabetes mellitus with other diabetic kidney complication: Secondary | ICD-10-CM | POA: Diagnosis present

## 2019-10-29 LAB — CBC
HCT: 37.2 % — ABNORMAL LOW (ref 39.0–52.0)
Hemoglobin: 12.8 g/dL — ABNORMAL LOW (ref 13.0–17.0)
MCH: 32.9 pg (ref 26.0–34.0)
MCHC: 34.4 g/dL (ref 30.0–36.0)
MCV: 95.6 fL (ref 80.0–100.0)
Platelets: 279 10*3/uL (ref 150–400)
RBC: 3.89 MIL/uL — ABNORMAL LOW (ref 4.22–5.81)
RDW: 14.7 % (ref 11.5–15.5)
WBC: 15 10*3/uL — ABNORMAL HIGH (ref 4.0–10.5)
nRBC: 0 % (ref 0.0–0.2)

## 2019-10-29 LAB — PROTIME-INR
INR: 1.1 (ref 0.8–1.2)
Prothrombin Time: 13.4 seconds (ref 11.4–15.2)

## 2019-10-29 LAB — COMPREHENSIVE METABOLIC PANEL
ALT: 11 U/L (ref 0–44)
AST: 17 U/L (ref 15–41)
Albumin: 3.3 g/dL — ABNORMAL LOW (ref 3.5–5.0)
Alkaline Phosphatase: 49 U/L (ref 38–126)
Anion gap: 17 — ABNORMAL HIGH (ref 5–15)
BUN: 59 mg/dL — ABNORMAL HIGH (ref 8–23)
CO2: 24 mmol/L (ref 22–32)
Calcium: 8.5 mg/dL — ABNORMAL LOW (ref 8.9–10.3)
Chloride: 98 mmol/L (ref 98–111)
Creatinine, Ser: 6.95 mg/dL — ABNORMAL HIGH (ref 0.61–1.24)
GFR calc Af Amer: 8 mL/min — ABNORMAL LOW (ref >60)
GFR calc non Af Amer: 7 mL/min — ABNORMAL LOW (ref >60)
Glucose, Bld: 199 mg/dL — ABNORMAL HIGH (ref 70–99)
Potassium: 3.3 mmol/L — ABNORMAL LOW (ref 3.5–5.1)
Sodium: 139 mmol/L (ref 135–145)
Total Bilirubin: 1 mg/dL (ref 0.3–1.2)
Total Protein: 7.2 g/dL (ref 6.5–8.1)

## 2019-10-29 LAB — LIPASE, BLOOD: Lipase: 41 U/L (ref 11–51)

## 2019-10-29 LAB — BRAIN NATRIURETIC PEPTIDE: B Natriuretic Peptide: 122.5 pg/mL — ABNORMAL HIGH (ref 0.0–100.0)

## 2019-10-29 LAB — APTT: aPTT: 27 seconds (ref 24–36)

## 2019-10-29 NOTE — ED Notes (Signed)
Patient states he is here because of the "same as last time"  Patient reports abdominal pain and diarrhea.  Patient did not finish his last dialysis treatment.  Patient resting quietly in stretcher, no acute distress noted.

## 2019-10-29 NOTE — ED Notes (Signed)
Patient in ultrasound at this time.

## 2019-10-29 NOTE — ED Triage Notes (Signed)
Pt arrived via POV, brought to ED by son, states he was at dialysis and started having abdominal pain and vomiting.  Pt did not get a complete treatment today, states they took him off early.  Pt goes to dialysis TTS, last treatment was Thursday before being discharged from the hospital. Pt states he was in the hospital for the same sxs he is having today.

## 2019-10-29 NOTE — ED Notes (Signed)
Patient's pulse oxi mid 80's on room air, place on oxygen at 3 liters via nasal cannula and back up into the upper 90's

## 2019-10-29 NOTE — ED Provider Notes (Signed)
[facility] Emergency Department Provider Note   ____________________________________________   First MD Initiated Contact with Patient 10/29/19 1843     (approximate)  I have reviewed the triage vital signs and the nursing notes.   HISTORY  Chief Complaint Abdominal Pain and Weakness    HPI Jose [name] is a 74 y.o. male end-stage dialysis  Patient presents today reports increasing shortness of breath.  Reports similar to when he came to the hospital about a week ago.  Symptoms of return.  Reports nonproductive slight cough, but primarily shortness of breath.  Reports is the same symptoms he had about a week ago.  Denies fevers or chills.  Neuro reportedly  there is mention of having abdominal pain, and vomiting, when I talked to him he reports that his only symptom has been increasing shortness of breath  Past Medical History:  Diagnosis Date  . Chronic kidney disease   . Depression   . Diabetes mellitus without complication ([place])   . Glaucoma   . Glaucoma   . Gout   . Heart murmur   . HLD (hyperlipidemia)   . Hypertension   . Prostate cancer ([place])   . Renal failure    dialysis t/t/s  . 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 Hypokalemia 10/30/2019  . SOB (shortness of breath) 10/29/2019  . Pulmonary edema 10/22/2019  . Diarrhea 10/22/2019  . Acute on chronic diastolic CHF (congestive heart failure) ([place]) 10/22/2019  . HLD (hyperlipidemia)   . Stroke ([place])   . Depression   . Tobacco abuse   . Pleural effusion on left   . Acute urinary retention   . Foot ulcer ([place]) 12/15/2018  . Ischemic foot 10/11/2018  . 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 Gangrene of toe of right foot ([place]) 06/13/2018  . Type II diabetes mellitus with renal manifestations ([place]) 04/16/2018  . Hypertension 04/16/2018  . ESRD on dialysis ([place]) 04/16/2018  .  Malnutrition of moderate degree 03/27/2018  . Renal failure 03/21/2018    Past Surgical History:  Procedure Laterality Date  . AMPUTATION TOE Bilateral 07/23/2018   Procedure: TOE MPJ RIGHT 3RD AND LEFT 2ND;  Surgeon: Samara Deist, DPM;  Location: [facility] ORS;  Service: Podiatry;  Laterality: Bilateral;  . APPENDECTOMY    . AV FISTULA PLACEMENT Left 06/09/2018   Procedure: ARTERIOVENOUS (AV) FISTULA CREATION ( BRACHIAL CEPHALIC);  Surgeon: Algernon Huxley, MD;  Location: [facility] ORS;  Service: Vascular;  Laterality: Left;  . CHOLECYSTECTOMY    . DIALYSIS/PERMA CATHETER INSERTION N/A 03/24/2018   Procedure: DIALYSIS/PERMA CATHETER INSERTION;  Surgeon: Katha Cabal, MD;  Location: [place] [facility];  Service: Cardiovascular;  Laterality: N/A;  . DIALYSIS/PERMA CATHETER REMOVAL N/A 09/06/2018   Procedure: DIALYSIS/PERMA CATHETER REMOVAL;  Surgeon: Algernon Huxley, MD;  Location: [place] [facility];  Service: Cardiovascular;  Laterality: N/A;  . INSERTION PROSTATE RADIATION SEED    . IRRIGATION AND DEBRIDEMENT FOOT Right 12/17/2018   Procedure: IRRIGATION AND DEBRIDEMENT FOOT;  Surgeon: Samara Deist, DPM;  Location: [facility] OR[facility];  Service: Podiatry;  Laterality: Right;  . LOWER EXTREMITY ANGIOGRAPHY Right 06/16/2018   Procedure: LOWER EXTREMITY ANGIOGRAPHY;  Surgeon: Algernon Huxley, MD;  Location: [place] [facility];  Service: Cardiovascular;  Laterality: Right;  . LOWER EXTREMITY ANGIOGRAPHY Left 08/30/2018   Procedure: LOWER EXTREMITY ANGIOGRAPHY;  Surgeon: Leotis Pain  S, MD;  Location: [place] [facility];  Service: Cardiovascular;  Laterality: Left;  . LOWER EXTREMITY ANGIOGRAPHY Right 09/30/2018   Procedure: LOWER EXTREMITY ANGIOGRAPHY;  Surgeon: Algernon Huxley, MD;  Location: [place] [facility];  Service: Cardiovascular;  Laterality: Right;  . LOWER EXTREMITY ANGIOGRAPHY Left 10/07/2018   Procedure: LOWER EXTREMITY ANGIOGRAPHY;  Surgeon: Algernon Huxley, MD;  Location: [place] [facility];  Service:  Cardiovascular;  Laterality: Left;  . LOWER EXTREMITY ANGIOGRAPHY Right 12/20/2018   Procedure: Lower Extremity Angiography;  Surgeon: Algernon Huxley, MD;  Location: [place] [facility];  Service: Cardiovascular;  Laterality: Right;  . TRANSMETATARSAL AMPUTATION Bilateral 10/13/2018   Procedure: 1.  Amputation right great toe MTPJ 2.  Amputation right second toe MTPJ 3.  Amputation right fourth toe MTPJ 4.  Amputation right fifth toe MTPJ 5.  Excision distal second metatarsal head right foot 6.  Excision distal third metatarsal head right foot 7.  Amputation left third toe 8.  Incision and drainage infectionleft 2nd toe amputation site. ;  Surgeon: Samara Deist, DPM;  Location    Prior to Admission medications   Medication Sig Start Date End Date Taking? Authorizing Provider  allopurinol (ZYLOPRIM) 100 MG tablet Take 1 tablet (100 mg total) by mouth daily. 10/24/19   Nicole Kindred A, DO  amLODipine (NORVASC) 10 MG tablet Take 1 tablet (10 mg total) by mouth daily. 10/24/19   Ezekiel Slocumb, DO  aspirin 81 MG EC tablet Take 1 tablet (81 mg total) by mouth daily. 10/24/19   Ezekiel Slocumb, DO  atorvastatin (LIPITOR) 10 MG tablet Take 1 tablet (10 mg total) by mouth daily at 6 PM. 10/24/19   Nicole Kindred A, DO  calcium acetate (PHOSLO) 667 MG capsule Take 2 capsules (1,334 mg total) by mouth 3 (three) times daily with meals. Patient not taking: Reported on 10/22/2019 12/21/18   Fritzi Mandes, MD  cloNIDine (CATAPRES) 0.2 MG tablet Take 1 tablet (0.2 mg total) by mouth 2 (two) times daily. 10/24/19   Ezekiel Slocumb, DO  clopidogrel (PLAVIX) 75 MG tablet Take 1 tablet (75 mg total) by mouth daily. 10/24/19   Nicole Kindred A, DO  escitalopram (LEXAPRO) 10 MG tablet Take 1 tablet (10 mg total) by mouth daily. 10/24/19   Ezekiel Slocumb, DO  glipiZIDE (GLUCOTROL) 5 MG tablet Take 1 tablet (5 mg total) by mouth daily before breakfast. 10/24/19   Nicole Kindred A, DO  hydrALAZINE (APRESOLINE) 25 MG  tablet Take 1 tablet (25 mg total) by mouth every 8 (eight) hours. 10/24/19   Ezekiel Slocumb, DO  tamsulosin (FLOMAX) 0.4 MG CAPS capsule Take 1 capsule (0.4 mg total) by mouth daily. 10/25/19   Ezekiel Slocumb, DO  traMADol Veatrice Bourbon) 50 MG tablet 50 mg. 03/16/19   [provider]    Allergies Penicillin g  Family History  Problem Relation Age of Onset  . Varicose Veins Neg Hx     Social History Social History   Tobacco Use  . Smoking status: Current Some Day Smoker    Packs/day: 0.10    Types: Cigarettes  . Smokeless tobacco: Never Used  Substance Use Topics  . Alcohol use: Not Currently    Alcohol/week: 0.0 standard drinks  . Drug use: Never    Review of Systems Constitutional: No fever/chills Eyes: No visual changes. ENT: No sore throat. Cardiovascular: Denies chest pain. Respiratory: Increasing shortness of breath the last couple days Gastrointestinal: No abdominal pain.  Genitourinary: Negative for dysuria. Musculoskeletal: Negative for back pain. Skin: Negative for rash. Neurological: Negative for headaches, areas of focal weakness or numbness.    ____________________________________________   PHYSICAL EXAM:  VITAL SIGNS: ED Triage Vitals  Enc Vitals Group     BP 10/29/19 1344 (!) 98/50     Pulse Rate 10/29/19 1344 89     Resp 10/29/19 1344 18     Temp 10/29/19 1344 97.8 F (36.6 C)     Temp Source 10/29/19 1344 Oral     SpO2 10/29/19 1344 93 %     Weight 10/29/19 1349 119 lb 0.8 oz (54 kg)     Height 10/29/19 1349 6\' 1"  (1.854 m)     Head Circumference --      Peak Flow --      Pain Score 10/29/19 1348 10     Pain Loc --      Pain Edu? --      Excl. in [place]? --     Constitutional: Alert and oriented. Well appearing and in no acute distress. Eyes: Conjunctivae are normal. Head: Atraumatic. Nose: No congestion/rhinnorhea. Mouth/Throat: Mucous membranes are moist. Neck: No stridor.  Cardiovascular: Normal rate, regular rhythm.  Grossly normal heart sounds.  Good peripheral circulation. Respiratory: Mild tachypnea, mild use accessory muscles.  Diminished lung sounds over the left side, right lung clear. Gastrointestinal: Soft and nontender. No distention. Musculoskeletal: No lower extremity tenderness nor edema. Neurologic:  Normal speech and language. No gross focal neurologic deficits are appreciated.  Skin:  Skin is warm, dry and intact. No rash noted. Psychiatric: Mood and affect are normal. Speech and behavior are normal.  ____________________________________________   LABS (all labs ordered are listed, but only abnormal results are displayed)  Labs Reviewed  COMPREHENSIVE METABOLIC PANEL - Abnormal; Notable for the following components:      Result Value   Potassium 3.3 (*)    Glucose, Bld 199 (*)    BUN 59 (*)    Creatinine, Ser 6.95 (*)    Calcium 8.5 (*)    Albumin 3.3 (*)    GFR calc non Af Amer 7 (*)    GFR calc Af Amer 8 (*)    Anion gap 17 (*)    All other components within normal limits  CBC - Abnormal; Notable for the following components:   WBC 15.0 (*)    RBC 3.89 (*)    Hemoglobin 12.8 (*)    HCT 37.2 (*)    All other components within normal limits  BRAIN NATRIURETIC PEPTIDE - Abnormal; Notable for the following components:   B Natriuretic Peptide 122.5 (*)    All other components within normal limits  BODY FLUID CULTURE  LIPASE, BLOOD  PROTIME-INR  APTT  ALBUMIN, PLEURAL OR PERITONEAL FLUID  PROTEIN, PLEURAL OR PERITONEAL FLUID  GLUCOSE, PLEURAL OR PERITONEAL FLUID  URINALYSIS, COMPLETE (UACMP) WITH MICROSCOPIC  BODY FLUID CELL COUNT WITH DIFFERENTIAL  CHOLESTEROL, BODY FLUID  CYTOLOGY - NON PAP   ____________________________________________  EKG  Reviewed interpreted by me at 1410 Heart rate 99 QRS 89 QTc 500 Low amplitude P waves, favor first-degree AV block, left ventricular hypertrophy with repolarization abnormality.  No  STEMI ____________________________________________  RADIOLOGY  DG Chest Port 1 View  Result Date: 10/29/2019 CLINICAL DATA:  74 year old male status post thoracentesis. EXAM: PORTABLE CHEST 1 VIEW COMPARISON:  Earlier radiograph dated 10/29/2019. FINDINGS: Interval decrease in the size of the left pleural effusion with moderate residual effusion. There is aerated portion of  the left upper lobe. The right lung is clear. No pneumothorax. Stable cardiomediastinal silhouette. No acute osseous pathology. IMPRESSION: Interval decrease in the size of the left pleural effusion with moderate residual effusion. No pneumothorax. Electronically Signed   By: Anner Crete M.D.   On: 10/29/2019 23:34   DG Chest Port 1 View  Result Date: 10/29/2019 CLINICAL DATA:  Dyspnea. Abdominal pain and vomiting while at dialysis today, unable to get complete treatment. EXAM: PORTABLE CHEST 1 VIEW COMPARISON:  Radiograph 10/22/2019 FINDINGS: Progressive left pleural effusion with near complete opacification of the left hemithorax, small portion of left apical lung is aerated. There is rightward mediastinal shift suggesting large fluid volume rather than compressive atelectasis. Mediastinal contours are obscured by left lung opacity. Probable right perihilar atelectasis. No significant right pleural effusion. No pneumothorax. IMPRESSION: Progressive left pleural effusion over the past week with near complete opacification of the left hemithorax. Rightward mediastinal shift suggesting large fluid volume volume rather than passive atelectasis. Electronically Signed   By: Keith Rake M.D.   On: 10/29/2019 20:45   US THORACENTESIS ASP PLEURAL SPACE W/IMG GUIDE  Result Date: 10/29/2019 INDICATION: 74 year old with shortness of breath and large left pleural effusion. EXAM: ULTRASOUND GUIDED LEFT THORACENTESIS MEDICATIONS: None. COMPLICATIONS: None immediate. PROCEDURE: An ultrasound guided thoracentesis was thoroughly  discussed with the patient and questions answered. The benefits, risks, alternatives and complications were also discussed. The patient understands and wishes to proceed with the procedure. Written consent was obtained. Ultrasound was performed to localize and Mateo Overbeck an adequate pocket of fluid in the left chest. The area was then prepped and draped in the normal sterile fashion. 1% Lidocaine was used for local anesthesia. Under ultrasound guidance a 6 Fr Safe-T-Centesis catheter was introduced. Thoracentesis was performed. The catheter was removed and a dressing applied. FINDINGS: A total of approximately 2.35 L of bloody fluid was removed. Samples were sent to the laboratory as requested by the clinical team. IMPRESSION: Successful ultrasound guided left thoracentesis yielding 2.35 L of pleural fluid. Electronically Signed   By: Markus Daft M.D.   On: 10/29/2019 23:26    Imaging studies reviewed, including notable improvement in pleural effusion post intervention ____________________________________________   PROCEDURES  Procedure(s) performed: None  Procedures  Critical Care performed: No  ____________________________________________   INITIAL IMPRESSION / ASSESSMENT AND PLAN / ED COURSE  Pertinent labs & imaging results that were available during my care of the patient were reviewed by me and considered in my medical decision making (see chart for details).   Increasing shortness of breath, given his recent clinical history and review his x-ray concerning for significant left pleural effusion.  I suspect this would likely be transudate of in nature, but will order labs for additional follow-up.  No symptoms of ACS.  By clinical exam does not appear acutely volume overloaded, except imaging certainly seems to have significant left pleural effusion  Patient does exhibit leukocytosis, but lacks clear infectious symptomatology and given his imaging findings I suspect likely worsening pleural  effusion notable cause for hypoxia.  Denies chest pain.  Clinical Course as of Oct 29 56  Sat Oct 29, 2019  2158 [facility] radiology paging on-call IR physician    [MQ]  2306 Case discussed with Dr. Holley Raring, agreeable with proceeding with thoracentesis.  Aware of the patient's previous history of fluid retention and left pleural effusion.  Verbally discussed with the patient, he is understanding of plan to proceed with thoracentesis due to the amount of suspected fluid on  his left lung and work of breathing.  Discussed case with interventional radiologist Dr. Anselm Pancoast   [MQ]    Clinical Course User Index [MQ] [name], MD    Admission discussed with Dr. Jonelle Sidle.  Consult placed to Dr. Holley Raring who is aware of patient's plan for admission and thoracentesis.  Pleural fluid sent for evaluation and culture   Of note patient feels improvement, appears improved with improvement significantly and work of breathing after thoracentesis.  Admitted to the hospitalist service Dr. Jonelle Sidle for ongoing care and management  ____________________________________________   FINAL CLINICAL IMPRESSION(S) / ED DIAGNOSES  Final diagnoses:  Hypoxia  Left pleural effusion, large        Note:  This document was prepared using Dragon voice recognition software and may include unintentional dictation errors       [name], MD 10/30/19 [phone]

## 2019-10-29 NOTE — ED Notes (Signed)
Pt placed in room 8, placed on cardiac monitor and bp and pulse ox, pt oxygen 86% on RA, pt states he does not wear oxygen but states he has trouble breathing when laying down.  Pt HOB raised and oxygen 4L [place] placed.

## 2019-10-29 NOTE — ED Notes (Signed)
Oxygen off to determine room air sats.

## 2019-10-29 NOTE — Procedures (Addendum)
Interventional Radiology Procedure:   Indications: Shortness of breath and large left pleural effusion  Procedure: US guided thoracentesis  Findings: Removed 2.35 liters of bloody fluid from left chest.  Complications: None     EBL: Less than 10 ml  Plan: Follow up CXR   Jose [name] R. Anselm Pancoast, MD  Pager: [phone]

## 2019-10-30 ENCOUNTER — Inpatient Hospital Stay: Payer: Medicare Other

## 2019-10-30 ENCOUNTER — Encounter: Payer: Self-pay | Admitting: Internal Medicine

## 2019-10-30 DIAGNOSIS — Z992 Dependence on renal dialysis: Secondary | ICD-10-CM

## 2019-10-30 DIAGNOSIS — E876 Hypokalemia: Secondary | ICD-10-CM | POA: Diagnosis present

## 2019-10-30 DIAGNOSIS — C801 Malignant (primary) neoplasm, unspecified: Secondary | ICD-10-CM | POA: Diagnosis present

## 2019-10-30 DIAGNOSIS — Z9889 Other specified postprocedural states: Secondary | ICD-10-CM

## 2019-10-30 DIAGNOSIS — I10 Essential (primary) hypertension: Secondary | ICD-10-CM | POA: Diagnosis present

## 2019-10-30 DIAGNOSIS — J9601 Acute respiratory failure with hypoxia: Secondary | ICD-10-CM | POA: Diagnosis present

## 2019-10-30 DIAGNOSIS — N186 End stage renal disease: Secondary | ICD-10-CM

## 2019-10-30 DIAGNOSIS — I5032 Chronic diastolic (congestive) heart failure: Secondary | ICD-10-CM | POA: Diagnosis present

## 2019-10-30 DIAGNOSIS — E1169 Type 2 diabetes mellitus with other specified complication: Secondary | ICD-10-CM | POA: Diagnosis present

## 2019-10-30 LAB — GLUCOSE, POCT (MANUAL RESULT ENTRY)
POC Glucose: 85 mg/dl (ref 70–99)
POC Glucose: 90 mg/dl (ref 70–99)

## 2019-10-30 LAB — ALBUMIN, PLEURAL OR PERITONEAL FLUID: Albumin, Fluid: 2.4 g/dL

## 2019-10-30 LAB — GLUCOSE, CAPILLARY
Glucose-Capillary: 80 mg/dL (ref 70–99)
Glucose-Capillary: 85 mg/dL (ref 70–99)
Glucose-Capillary: 90 mg/dL (ref 70–99)
Glucose-Capillary: 63 mg/dL — ABNORMAL LOW (ref 70–99)
Glucose-Capillary: 85 mg/dL (ref 70–99)
Glucose-Capillary: 131 mg/dL — ABNORMAL HIGH (ref 70–99)

## 2019-10-30 LAB — CBC
HCT: 36.7 % — ABNORMAL LOW (ref 39.0–52.0)
Hemoglobin: 12.2 g/dL — ABNORMAL LOW (ref 13.0–17.0)
MCH: 33.3 pg (ref 26.0–34.0)
MCHC: 33.2 g/dL (ref 30.0–36.0)
MCV: 100.3 fL — ABNORMAL HIGH (ref 80.0–100.0)
Platelets: 249 10*3/uL (ref 150–400)
RBC: 3.66 MIL/uL — ABNORMAL LOW (ref 4.22–5.81)
RDW: 14.6 % (ref 11.5–15.5)
WBC: 12.8 10*3/uL — ABNORMAL HIGH (ref 4.0–10.5)
nRBC: 0 % (ref 0.0–0.2)

## 2019-10-30 LAB — BASIC METABOLIC PANEL
Anion gap: 12 (ref 5–15)
BUN: 68 mg/dL — ABNORMAL HIGH (ref 8–23)
CO2: 29 mmol/L (ref 22–32)
Calcium: 7.2 mg/dL — ABNORMAL LOW (ref 8.9–10.3)
Chloride: 101 mmol/L (ref 98–111)
Creatinine, Ser: 8.26 mg/dL — ABNORMAL HIGH (ref 0.61–1.24)
GFR calc Af Amer: 7 mL/min — ABNORMAL LOW (ref >60)
GFR calc non Af Amer: 6 mL/min — ABNORMAL LOW (ref >60)
Glucose, Bld: 95 mg/dL (ref 70–99)
Potassium: 4.3 mmol/L (ref 3.5–5.1)
Sodium: 142 mmol/L (ref 135–145)

## 2019-10-30 LAB — BODY FLUID CELL COUNT WITH DIFFERENTIAL
Eos, Fluid: 3 %
Lymphs, Fluid: 5 %
Monocyte-Macrophage-Serous Fluid: 27 %
Neutrophil Count, Fluid: 63 %
Other Cells, Fluid: 1 %
Total Nucleated Cell Count, Fluid: 1271 cu mm

## 2019-10-30 LAB — PROTEIN, PLEURAL OR PERITONEAL FLUID: Total protein, fluid: 4.9 g/dL

## 2019-10-30 LAB — HEMOGLOBIN A1C
Hgb A1c MFr Bld: 5.6 % (ref 4.8–5.6)
Mean Plasma Glucose: 114.02 mg/dL

## 2019-10-30 LAB — GLUCOSE, PLEURAL OR PERITONEAL FLUID: Glucose, Fluid: 60 mg/dL

## 2019-10-30 MED ORDER — IOHEXOL 9 MG/ML PO SOLN
500.0000 mL | ORAL | Status: AC
  Administered 2019-10-30 (×2): 500 mL via ORAL

## 2019-10-30 MED ORDER — INSULIN ASPART 100 UNIT/ML ~~LOC~~ SOLN: 0.0000 [IU] | Freq: Three times a day (TID) | SUBCUTANEOUS | Status: DC

## 2019-10-30 MED ORDER — ONDANSETRON HCL 4 MG PO TABS: 4.0000 mg | ORAL_TABLET | Freq: Four times a day (QID) | ORAL | Status: DC | PRN

## 2019-10-30 MED ORDER — INSULIN ASPART 100 UNIT/ML ~~LOC~~ SOLN: 0.0000 [IU] | Freq: Every day | SUBCUTANEOUS | Status: DC

## 2019-10-30 MED ORDER — HEPARIN SODIUM (PORCINE) 5000 UNIT/ML IJ SOLN
5000.0000 [IU] | Freq: Three times a day (TID) | INTRAMUSCULAR | Status: DC
  Administered 2019-10-30 – 2019-11-01 (×8): 5000 [IU] via SUBCUTANEOUS
  Filled 2019-10-30 (×8): qty 1

## 2019-10-30 MED ORDER — ONDANSETRON HCL 4 MG/2ML IJ SOLN: 4.0000 mg | Freq: Four times a day (QID) | INTRAMUSCULAR | Status: DC | PRN

## 2019-10-30 MED ORDER — IOHEXOL 300 MG/ML  SOLN
75.0000 mL | Freq: Once | INTRAMUSCULAR | Status: AC | PRN
  Administered 2019-10-30: 75 mL via INTRAVENOUS

## 2019-10-30 MED ORDER — NICOTINE 21 MG/24HR TD PT24
21.0000 mg | MEDICATED_PATCH | Freq: Every day | TRANSDERMAL | Status: DC
  Administered 2019-10-30 – 2019-11-05 (×6): 21 mg via TRANSDERMAL
  Filled 2019-10-30 (×6): qty 1

## 2019-10-30 NOTE — H&P (Signed)
History and Physical   DEVERE BREM NID:[id number] DOB: Apr 09, 1946 DOA: 10/29/2019  Referring MD/NP/PA: Dr. Jacqualine Code  PCP: [name], MD   Outpatient Specialists: Nephrology  Patient coming from: Hemodialysis  Chief Complaint: Shortness of breath  HPI: Jose [name] is a 74 y.o. male with medical history significant of end-stage renal disease on hemodialysis TTS, diabetes, hypertension, prostate cancer, CVA, depression, glaucoma and gout with hyperlipidemia who came to the ER with significant shortness of breath.  Patient's son gave the history that he was at dialysis and started having abdominal pain and then had vomiting.  He did not complete his treatment then.  They took him off early and was brought to the ER.  He was in the hospital recently and discharged on Thursday.  During the hospitalization he was also having significant shortness of breath and cough.  He was treated for productive cough and shortness of breath.  During that time he did have some watery diarrhea.  No fever or chills.  He was noted to have turbid urine suspected to have UTI.  He was treated for pulmonary edema and acute on chronic diastolic heart failure which resolved.  Today however patient was noted to have large left left-sided pleural effusion suspected to be primarily responsible for his symptoms.  He had thoracentesis performed tonight with about 2 L of fluid removed.  He is relatively improved but still short of breath.  Patient being admitted for further treatment in the hospital..  ED Course: Temperature 97.8 blood pressure 130/72 pulse 89 respiratory of 21 oxygen sat 83% on room air currently 95% on 2 L.  White count is 15,000 hemoglobin 12.8 and platelets 279 sodium 139 potassium 3.3 chloride 98.  His CO2 is 24 BUN 59 creatinine 6.95 and calcium 8.5.  Glucose is 199 INR 1.1.  Initial chest x-ray showed progressive left pleural effusion worsening since last week with complete opacification of the left  hemithorax.  Subsequent ultrasound-guided thoracentesis performed with about 2 L of fluid removed.  Review of Systems: As per HPI otherwise 10 point review of systems negative.    Past Medical History:  Diagnosis Date  . Chronic kidney disease   . Depression   . Diabetes mellitus without complication ([place])   . Glaucoma   . Glaucoma   . Gout   . Heart murmur   . HLD (hyperlipidemia)   . Hypertension   . Prostate cancer ([place])   . Renal failure    dialysis t/t/s  . Stroke ([place])    tia x 2    Past Surgical History:  Procedure Laterality Date  . AMPUTATION TOE Bilateral 07/23/2018   Procedure: TOE MPJ RIGHT 3RD AND LEFT 2ND;  Surgeon: Samara Deist, DPM;  Location: [facility];  Service: Podiatry;  Laterality: Bilateral;  . APPENDECTOMY    . AV FISTULA PLACEMENT Left 06/09/2018   Procedure: ARTERIOVENOUS (AV) FISTULA CREATION ( BRACHIAL CEPHALIC);  Surgeon: Algernon Huxley, MD;  Location: [facility];  Service: Vascular;  Laterality: Left;  . CHOLECYSTECTOMY    . DIALYSIS/PERMA CATHETER INSERTION N/A 03/24/2018   Procedure: DIALYSIS/PERMA CATHETER INSERTION;  Surgeon: Katha Cabal, MD;  Location: [place] [facility];  Service: Cardiovascular;  Laterality: N/A;  . DIALYSIS/PERMA CATHETER REMOVAL N/A 09/06/2018   Procedure: DIALYSIS/PERMA CATHETER REMOVAL;  Surgeon: Algernon Huxley, MD;  Location: [place] [facility];  Service: Cardiovascular;  Laterality: N/A;  . INSERTION PROSTATE RADIATION SEED    . IRRIGATION AND DEBRIDEMENT FOOT Right 12/17/2018  Procedure: IRRIGATION AND DEBRIDEMENT FOOT;  Surgeon: Samara Deist, DPM;  Location: [facility];  Service: Podiatry;  Laterality: Right;  . LOWER EXTREMITY ANGIOGRAPHY Right 06/16/2018   Procedure: LOWER EXTREMITY ANGIOGRAPHY;  Surgeon: Algernon Huxley, MD;  Location: [place] [facility];  Service: Cardiovascular;  Laterality: Right;  . LOWER EXTREMITY ANGIOGRAPHY Left 08/30/2018   Procedure: LOWER EXTREMITY ANGIOGRAPHY;  Surgeon: Algernon Huxley, MD;   Location: [place] [facility];  Service: Cardiovascular;  Laterality: Left;  . LOWER EXTREMITY ANGIOGRAPHY Right 09/30/2018   Procedure: LOWER EXTREMITY ANGIOGRAPHY;  Surgeon: Algernon Huxley, MD;  Location: [place] [facility];  Service: Cardiovascular;  Laterality: Right;  . LOWER EXTREMITY ANGIOGRAPHY Left 10/07/2018   Procedure: LOWER EXTREMITY ANGIOGRAPHY;  Surgeon: Algernon Huxley, MD;  Location: [place] [facility];  Service: Cardiovascular;  Laterality: Left;  . LOWER EXTREMITY ANGIOGRAPHY Right 12/20/2018   Procedure: Lower Extremity Angiography;  Surgeon: Algernon Huxley, MD;  Location: [place] [facility];  Service: Cardiovascular;  Laterality: Right;  . TRANSMETATARSAL AMPUTATION Bilateral 10/13/2018   Procedure: 1.  Amputation right great toe MTPJ 2.  Amputation right second toe MTPJ 3.  Amputation right fourth toe MTPJ 4.  Amputation right fifth toe MTPJ 5.  Excision distal second metatarsal head right foot 6.  Excision distal third metatarsal head right foot 7.  Amputation left third toe 8.  Incision and drainage infectionleft 2nd toe amputation site. ;  Surgeon: Samara Deist, DPM;  Location     reports that he has been smoking cigarettes. He has been smoking about 0.10 packs per day. He has never used smokeless tobacco. He reports previous alcohol use. He reports that he does not use drugs.  Allergies  Allergen Reactions  . Penicillin G Other (See Comments)    Other reaction(s): Unknown DID THE REACTION INVOLVE: Swelling of the face/tongue/throat, SOB, or low BP? Unknown Sudden or severe rash/hives, skin peeling, or the inside of the mouth or nose? Unknown Did it require medical treatment? Unknown When did it last happen?unknown If all above answers are "NO", may proceed with cephalosporin use.     Family History  Problem Relation Age of Onset  . Varicose Veins Neg Hx      Prior to Admission medications   Medication Sig Start Date End Date Taking? Authorizing Provider    allopurinol (ZYLOPRIM) 100 MG tablet Take 1 tablet (100 mg total) by mouth daily. 10/24/19   Nicole Kindred A, DO  amLODipine (NORVASC) 10 MG tablet Take 1 tablet (10 mg total) by mouth daily. 10/24/19   Ezekiel Slocumb, DO  aspirin 81 MG EC tablet Take 1 tablet (81 mg total) by mouth daily. 10/24/19   Ezekiel Slocumb, DO  atorvastatin (LIPITOR) 10 MG tablet Take 1 tablet (10 mg total) by mouth daily at 6 PM. 10/24/19   Nicole Kindred A, DO  calcium acetate (PHOSLO) 667 MG capsule Take 2 capsules (1,334 mg total) by mouth 3 (three) times daily with meals. Patient not taking: Reported on 10/22/2019 12/21/18   Fritzi Mandes, MD  cloNIDine (CATAPRES) 0.2 MG tablet Take 1 tablet (0.2 mg total) by mouth 2 (two) times daily. 10/24/19   Ezekiel Slocumb, DO  clopidogrel (PLAVIX) 75 MG tablet Take 1 tablet (75 mg total) by mouth daily. 10/24/19   Nicole Kindred A, DO  escitalopram (LEXAPRO) 10 MG tablet Take 1 tablet (10 mg total) by mouth daily. 10/24/19   Nicole Kindred A, DO  glipiZIDE (GLUCOTROL) 5 MG  tablet Take 1 tablet (5 mg total) by mouth daily before breakfast. 10/24/19   Nicole Kindred A, DO  hydrALAZINE (APRESOLINE) 25 MG tablet Take 1 tablet (25 mg total) by mouth every 8 (eight) hours. 10/24/19   Ezekiel Slocumb, DO  tamsulosin (FLOMAX) 0.4 MG CAPS capsule Take 1 capsule (0.4 mg total) by mouth daily. 10/25/19   Ezekiel Slocumb, DO  traMADol Veatrice Bourbon) 50 MG tablet 50 mg. 03/16/19   [provider]    Physical Exam: Vitals:   10/29/19 2200 10/29/19 2230 10/29/19 2346 10/30/19 0000  BP: (!) 117/93 105/66 138/72 134/64  Pulse: (!) 49 71  68  Resp:    (!) 21  Temp:      TempSrc:      SpO2: 97% 99% 99% 96%  Weight:      Height:          Constitutional: Frail, chronically ill looking in mild distress Vitals:   10/29/19 2200 10/29/19 2230 10/29/19 2346 10/30/19 0000  BP: (!) 117/93 105/66 138/72 134/64  Pulse: (!) 49 71  68  Resp:    (!) 21  Temp:      TempSrc:       SpO2: 97% 99% 99% 96%  Weight:      Height:       Eyes: PERRL, lids and conjunctivae normal ENMT: Mucous membranes are moist. Posterior pharynx clear of any exudate or lesions.Normal dentition.  Neck: normal, supple, no masses, no thyromegaly Respiratory: Decreased air entry bilaterally especially on the left with diffuse crackles, rhonchi, no wheezing, use of extra muscle of respiration and increased respiratory efforts.  Cardiovascular: Regular rate and rhythm, no murmurs / rubs / gallops.  1+ extremity edema. 2+ pedal pulses. No carotid bruits.  Abdomen: no tenderness, no masses palpated. No hepatosplenomegaly. Bowel sounds positive.  Musculoskeletal: no clubbing / cyanosis. No joint deformity upper and lower extremities. Good ROM, no contractures. Normal muscle tone.  Skin: no rashes, lesions, ulcers. No induration Neurologic: CN 2-12 grossly intact. Sensation intact, DTR normal. Strength 5/5 in all 4.  Psychiatric: Normal judgment and insight. Alert and oriented x 3. Normal mood.     Labs on Admission: I have personally reviewed following labs and imaging studies  CBC: Recent Labs  Lab 10/23/19 0607 10/24/19 0418 10/29/19 1403  WBC 11.4* 10.7* 15.0*  NEUTROABS  --  7.9*  --   HGB 12.9* 11.7* 12.8*  HCT 37.9* 35.0* 37.2*  MCV 97.2 99.7 95.6  PLT 152 161 983   Basic Metabolic Panel: Recent Labs  Lab 10/23/19 0607 10/24/19 0418 10/29/19 1403  NA 138 135 139  K 3.5 3.5 3.3*  CL 98 98 98  CO2 27 28 24   GLUCOSE 106* 125* 199*  BUN 26* 38* 59*  CREATININE 4.42* 5.96* 6.95*  CALCIUM 8.4* 8.3* 8.5*  MG  --  1.9  --    GFR: Estimated Creatinine Clearance: 7.2 mL/min (A) (by C-G formula based on SCr of 6.95 mg/dL (H)). Liver Function Tests: Recent Labs  Lab 10/29/19 1403  AST 17  ALT 11  ALKPHOS 49  BILITOT 1.0  PROT 7.2  ALBUMIN 3.3*   Recent Labs  Lab 10/29/19 1403  LIPASE 41   No results for input(s): AMMONIA in the last 168 hours. Coagulation  Profile: Recent Labs  Lab 10/29/19 2217  INR 1.1   Cardiac Enzymes: No results for input(s): CKTOTAL, CKMB, CKMBINDEX, TROPONINI in the last 168 hours. BNP (last 3 results) No results for input(s):  PROBNP in the last 8760 hours. HbA1C: No results for input(s): HGBA1C in the last 72 hours. CBG: Recent Labs  Lab 10/23/19 1149 10/23/19 1641 10/23/19 2224 10/24/19 0806 10/24/19 1144  GLUCAP 161* 100* 144* 116* 159*   Lipid Profile: No results for input(s): CHOL, HDL, LDLCALC, TRIG, CHOLHDL, LDLDIRECT in the last 72 hours. Thyroid Function Tests: No results for input(s): TSH, T4TOTAL, FREET4, T3FREE, THYROIDAB in the last 72 hours. Anemia Panel: No results for input(s): VITAMINB12, FOLATE, FERRITIN, TIBC, IRON, RETICCTPCT in the last 72 hours. Urine analysis:    Component Value Date/Time   COLORURINE YELLOW (A) 10/22/2019 1218   APPEARANCEUR TURBID (A) 10/22/2019 1218   LABSPEC 1.014 10/22/2019 1218   PHURINE 6.0 10/22/2019 1218   GLUCOSEU NEGATIVE 10/22/2019 1218   HGBUR SMALL (A) 10/22/2019 1218   BILIRUBINUR NEGATIVE 10/22/2019 [place] 10/22/2019 1218   PROTEINUR 100 (A) 10/22/2019 1218   NITRITE NEGATIVE 10/22/2019 1218   LEUKOCYTESUR LARGE (A) 10/22/2019 1218   Sepsis Labs: @LABRCNTIP (procalcitonin:4,lacticidven:4) ) Recent Results (from the past 240 hour(s))  SARS Coronavirus 2 by RT PCR (hospital order, performed in [place] hospital lab) Nasopharyngeal Nasopharyngeal Swab     Status: None   Collection Time: 10/22/19 12:55 PM   Specimen: Nasopharyngeal Swab  Result Value Ref Range Status   SARS Coronavirus 2 NEGATIVE NEGATIVE Final    Comment: (NOTE) SARS-CoV-2 target nucleic acids are NOT DETECTED. The SARS-CoV-2 RNA is generally detectable in upper and lower respiratory specimens during the acute phase of infection. The lowest concentration of SARS-CoV-2 viral copies this assay can detect is 250 copies / mL. A negative result does not  preclude SARS-CoV-2 infection and should not be used as the sole basis for treatment or other patient management decisions.  A negative result may occur with improper specimen collection / handling, submission of specimen other than nasopharyngeal swab, presence of viral mutation(s) within the areas targeted by this assay, and inadequate number of viral copies (<250 copies / mL). A negative result must be combined with clinical observations, patient history, and epidemiological information. Fact Sheet for Patients:   StrictlyIdeas.no Fact Sheet for Healthcare Providers: BankingDealers.co.za This test is not yet approved or cleared  by the [place] FDA and has been authorized for detection and/or diagnosis of SARS-CoV-2 by FDA under an Emergency Use Authorization (EUA).  This EUA will remain in effect (meaning this test can be used) for the duration of the COVID-19 declaration under Section 564(b)(1) of the Act, 21 U.S.C. section 360bbb-3(b)(1), unless the authorization is terminated or revoked sooner. Performed at [facility], [address] [place], [place] [address]   MRSA PCR Screening     Status: None   Collection Time: 10/23/19  8:13 PM   Specimen: Nasal Mucosa; Nasopharyngeal  Result Value Ref Range Status   MRSA by PCR NEGATIVE NEGATIVE Final    Comment:        The GeneXpert MRSA Assay (FDA approved for NASAL specimens only), is one component of a comprehensive MRSA colonization surveillance program. It is not intended to diagnose MRSA infection nor to guide or monitor treatment for MRSA infections. Performed at [facility], [address], [place], [place] [address]      Radiological Exams on Admission: DG Chest [place] 1 View  Result Date: 10/29/2019 CLINICAL DATA:  74 year old male status post thoracentesis. EXAM: PORTABLE CHEST 1 VIEW COMPARISON:  Earlier radiograph dated 10/29/2019.  FINDINGS: Interval decrease in the size of the left  pleural effusion with moderate residual effusion. There is aerated portion of the left upper lobe. The right lung is clear. No pneumothorax. Stable cardiomediastinal silhouette. No acute osseous pathology. IMPRESSION: Interval decrease in the size of the left pleural effusion with moderate residual effusion. No pneumothorax. Electronically Signed   By: Anner Crete M.D.   On: 10/29/2019 23:34   DG Chest Port 1 View  Result Date: 10/29/2019 CLINICAL DATA:  Dyspnea. Abdominal pain and vomiting while at dialysis today, unable to get complete treatment. EXAM: PORTABLE CHEST 1 VIEW COMPARISON:  Radiograph 10/22/2019 FINDINGS: Progressive left pleural effusion with near complete opacification of the left hemithorax, small portion of left apical lung is aerated. There is rightward mediastinal shift suggesting large fluid volume rather than compressive atelectasis. Mediastinal contours are obscured by left lung opacity. Probable right perihilar atelectasis. No significant right pleural effusion. No pneumothorax. IMPRESSION: Progressive left pleural effusion over the past week with near complete opacification of the left hemithorax. Rightward mediastinal shift suggesting large fluid volume volume rather than passive atelectasis. Electronically Signed   By: Keith Rake M.D.   On: 10/29/2019 20:45   US THORACENTESIS ASP PLEURAL SPACE W/IMG GUIDE  Result Date: 10/29/2019 INDICATION: 74 year old with shortness of breath and large left pleural effusion. EXAM: ULTRASOUND GUIDED LEFT THORACENTESIS MEDICATIONS: None. COMPLICATIONS: None immediate. PROCEDURE: An ultrasound guided thoracentesis was thoroughly discussed with the patient and questions answered. The benefits, risks, alternatives and complications were also discussed. The patient understands and wishes to proceed with the procedure. Written consent was obtained. Ultrasound was performed to localize and  mark an adequate pocket of fluid in the left chest. The area was then prepped and draped in the normal sterile fashion. 1% Lidocaine was used for local anesthesia. Under ultrasound guidance a 6 Fr Safe-T-Centesis catheter was introduced. Thoracentesis was performed. The catheter was removed and a dressing applied. FINDINGS: A total of approximately 2.35 L of bloody fluid was removed. Samples were sent to the laboratory as requested by the clinical team. IMPRESSION: Successful ultrasound guided left thoracentesis yielding 2.35 L of pleural fluid. Electronically Signed   By: Markus Daft M.D.   On: 10/29/2019 23:26      Assessment/Plan Principal Problem:   Pleural effusion on left Active Problems:   Type II diabetes mellitus with renal manifestations ([facility])   Hypertension   ESRD on dialysis ([place])   Tobacco abuse   Acute on chronic diastolic CHF (congestive heart failure) ([facility])   SOB (shortness of breath)   Hypokalemia     #1 acute respiratory failure: Secondary to large left sided pleural effusion.  Status post thoracentesis.  Some improvement.  Observe patient in the hospital.  Has not completed hemodialysis today but may need to have one done tomorrow.  #2 left-sided pleural effusion: So far analysis showed transudate.  Awaiting full chemistry as well as Gram stain and culture.  Suspected all due to anasarca from renal failure.  Monitor closely.  #3 end-stage renal disease: Patient did not complete hemodialysis today.  Hemodialysis Tuesdays Thursdays and Saturdays.  Consult nephrology in the morning for decision making.  #4 diabetes: Sliding scale insulin and monitor.  #5 chronic diastolic CHF: May be because of patient's pleural effusion.  Monitor closely.  #6 hypertension: Resume home regimen.  Monitor closely.   DVT prophylaxis: Heparin Code Status: Full code Family Communication: Son over the phone Disposition Plan: Home Consults called: None but consult nephrology in the  morning Admission status: Inpatient  Severity of Illness: The appropriate  patient status for this patient is INPATIENT. Inpatient status is judged to be reasonable and necessary in order to provide the required intensity of service to ensure the patient's safety. The patient's presenting symptoms, physical exam findings, and initial radiographic and laboratory data in the context of their chronic comorbidities is felt to place them at high risk for further clinical deterioration. Furthermore, it is not anticipated that the patient will be medically stable for discharge from the hospital within 2 midnights of admission. The following factors support the patient status of inpatient.   " The patient's presenting symptoms include shortness of breath. " The worrisome physical exam findings include coarse breath sound bilaterally with no air entry on the left. " The initial radiographic and laboratory data are worrisome because of complete opacification of the left chest. " The chronic co-morbidities include end-stage renal disease on hemodialysis.   * I certify that at the point of admission it is my clinical judgment that the patient will require inpatient hospital care spanning beyond 2 midnights from the point of admission due to high intensity of service, high risk for further deterioration and high frequency of surveillance required.Barbette Merino MD [facility] Pager [phone]  If 7PM-7AM, please contact night-coverage [email or web address] Password [email or web address]  10/30/2019, 12:22 AM

## 2019-10-30 NOTE — Progress Notes (Signed)
PROGRESS NOTE    Jose [name]  CXK:[id number] DOB: Jun 08, 1945 DOA: 10/29/2019 PCP: [name], MD     Brief Narrative:  Jose [name] is a 74 y.o.BM PMHx Depression, CVA, ESRD on HD TTS, DM type II controlled with complication, Chronic Diastolic CHF HTN, Prostate cancer , Glaucoma and gout, HLD, tobacco abuse ("1/4bpd forever),  who came to the ER with significant shortness of breath.  Patient's son gave the history that he was at dialysis and started having abdominal pain and then had vomiting.  He did not complete his treatment then.  They took him off early and was brought to the ER.  He was in the hospital recently and discharged on Thursday.  During the hospitalization he was also having significant shortness of breath and cough.  He was treated for productive cough and shortness of breath.  During that time he did have some watery diarrhea.  No fever or chills.  He was noted to have turbid urine suspected to have UTI.  He was treated for pulmonary edema and acute on chronic diastolic heart failure which resolved.  Today however patient was noted to have large left left-sided pleural effusion suspected to be primarily responsible for his symptoms.  He had thoracentesis performed tonight with about 2 L of fluid removed.  He is relatively improved but still short of breath.  Patient being admitted for further treatment in the hospital..  ED Course: Temperature 97.8 blood pressure 130/72 pulse 89 respiratory of 21 oxygen sat 83% on room air currently 95% on 2 L.  White count is 15,000 hemoglobin 12.8 and platelets 279 sodium 139 potassium 3.3 chloride 98.  His CO2 is 24 BUN 59 creatinine 6.95 and calcium 8.5.  Glucose is 199 INR 1.1.  Initial chest x-ray showed progressive left pleural effusion worsening since last week with complete opacification of the left hemithorax.  Subsequent ultrasound-guided thoracentesis performed with about 2 L of fluid removed.   Subjective: 5/30 A/O x4,  negative CP, negative S OB, negative abdominal pain.  Cachectic.  States smokes 1/4 PPD forever.  States has had multiple left-sided thoracentesis but nobody has evaluated as to why he had recurrent effusion.   Assessment & Plan:   Principal Problem:   Pleural effusion on left Active Problems:   Type II diabetes mellitus with renal manifestations (HCC)   Hypertension   ESRD on dialysis (HCC)   Tobacco abuse   Acute on chronic diastolic CHF (congestive heart failure) (HCC)   SOB (shortness of breath)   Hypokalemia   Chronic diastolic CHF (congestive heart failure) (HCC)   Benign essential HTN   Malignant neoplasm of unknown origin ([place])   Acute respiratory failure with hypoxia (HCC)   End-stage renal disease on hemodialysis (HCC)   Diabetes mellitus type 2, controlled, with complications (HCC)  Chronic diastolic CHF -Strict in and out -Daily weight -Currently patient's BP controlled without medication monitor closely  Essential HTN -See CHF  Metastatic lung cancer vs primary lung cancer -Patient is primary versus metastatic cancer in his lung.  Will obtain CT abdomen pelvis since patient also has history of prostate cancer and will need for staging purposes.  Recurrent LEFT-sided pleural effusion: So far analysis showed transudate.  Awaiting full chemistry as well as Gram stain and culture.  Suspected all due to anasarca from renal failure.  Monitor closely -5/29 LEFT thoracentesis aspirated 2.3 L bloody fluid. -5/30 CT chest pending -5/30 spoke with Levada Dy in [place] Lab stateed fluid was sent off  for pathology/cytology to [place] earliest we would hear back any results is Tuesday.  Acute respiratory failure with hypoxia -Per patient has had multiple episodes of pleural effusions requiring draining, never worked up. -See recurrent pleural effusion   ESRD on HD TTS, -Per EMR partial session of HD on Thursday.    DM type II controlled with complication -6/27 hemoglobin A1c=  5.6 -Ultrasensitive SSI    DVT prophylaxis: Subcu heparin Code Status: Full Family Communication:  Disposition Plan:  Status is: Inpatient    Dispo: The patient is from: Home              Anticipated d/c is to: Home              Anticipated d/c date is: 7 June              Patient currently unstable      Consultants:    Procedures/Significant Events:  5/29 LEFT thoracentesis; 2.3 L bloody fluid aspirated 5/29 HD 5/30 CT chest W contrast;-spiculated cavitated mass in the left upper lobe is above highly suspicious for malignancy.  -Linear opacity extending off the inferior mass is likely atelectasis.  -The mass extends to the left pleural surface with mild associated pleural thickening and fluid. Given history, the large left pleural effusion is likely malignant. -Adenopathy in the mediastinum worrisome for metastatic disease. A PET-CT could better evaluate. 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 -Mildly prominent left adrenal gland, incompletely evaluated. Recommend attention on PET-CT. -Atelectasis in the bases. -Emphysematous changes in the lungs.   I have personally reviewed and interpreted all radiology studies and my findings are as above.  VENTILATOR SETTINGS:    Cultures 5/29 LEFT pleural fluid NGTD    Antimicrobials: Anti-infectives (From admission, onward)   None       Devices    LINES / TUBES:      Continuous Infusions:   Objective: Vitals:   10/30/19 0137 10/30/19 0554 10/30/19 0945 10/30/19 1347  BP: (!) 142/68 (!) 145/73 138/69 135/74  Pulse: 68 64    Resp: 18 16 18 18   Temp: 98.3 F (36.8 C) 98.5 F (36.9 C) 98.9 F (37.2 C) 98.3 F (36.8 C)  TempSrc: Oral Oral Oral Oral  SpO2: 99% 99% 99% 99%  Weight:      Height:        Intake/Output Summary (Last 24 hours) at 10/30/2019 1559 Last data filed at 10/30/2019 0900 Gross per 24 hour  Intake 240 ml  Output --  Net 240 ml    Filed Weights   10/29/19 1349  Weight: 54 kg    Examination:  General: A/O x4, no acute respiratory distress, cachectic Eyes: negative scleral hemorrhage, negative anisocoria, negative icterus ENT: Negative Runny nose, negative gingival bleeding, Neck:  Negative scars, masses, torticollis, lymphadenopathy, JVD Lungs: Clear to auscultation bilaterally without wheezes or crackles Cardiovascular: Regular rate and rhythm without murmur gallop or rub normal S1 and S2 Abdomen: negative abdominal pain, nondistended, positive soft, bowel sounds, no rebound, no ascites, no appreciable mass Extremities: No significant cyanosis, clubbing, or edema bilateral lower extremities Skin: Negative rashes, lesions, ulcers Psychiatric:  Negative depression, negative anxiety, negative fatigue, negative mania  Central nervous system:  Cranial nerves II through XII intact, tongue/uvula midline, all extremities muscle strength 5/5, sensation intact throughout, negative dysarthria, negative expressive aphasia, negative receptive aphasia.  .     Data Reviewed: Care during the described time interval was provided by me .  I  have reviewed this patient's available data, including medical history, events of note, physical examination, and all test results as part of my evaluation.  CBC: Recent Labs  Lab 10/24/19 0418 10/29/19 1403 10/30/19 0207  WBC 10.7* 15.0* 12.8*  NEUTROABS 7.9*  --   --   HGB 11.7* 12.8* 12.2*  HCT 35.0* 37.2* 36.7*  MCV 99.7 95.6 100.3*  PLT 161 279 998   Basic Metabolic Panel: Recent Labs  Lab 10/24/19 0418 10/29/19 1403 10/30/19 0207  NA 135 139 142  K 3.5 3.3* 4.3  CL 98 98 101  CO2 28 24 29   GLUCOSE 125* 199* 95  BUN 38* 59* 68*  CREATININE 5.96* 6.95* 8.26*  CALCIUM 8.3* 8.5* 7.2*  MG 1.9  --   --    GFR: Estimated Creatinine Clearance: 6.1 mL/min (A) (by C-G formula based on SCr of 8.26 mg/dL (H)). Liver Function Tests: Recent Labs  Lab 10/29/19 1403  AST  17  ALT 11  ALKPHOS 49  BILITOT 1.0  PROT 7.2  ALBUMIN 3.3*   Recent Labs  Lab 10/29/19 1403  LIPASE 41   No results for input(s): AMMONIA in the last 168 hours. Coagulation Profile: Recent Labs  Lab 10/29/19 2217  INR 1.1   Cardiac Enzymes: No results for input(s): CKTOTAL, CKMB, CKMBINDEX, TROPONINI in the last 168 hours. BNP (last 3 results) No results for input(s): PROBNP in the last 8760 hours. HbA1C: Recent Labs    10/30/19 0207  HGBA1C 5.6   CBG: Recent Labs  Lab 10/24/19 0806 10/24/19 1144 10/30/19 0139 10/30/19 0823 10/30/19 1145  GLUCAP 116* 159* 80 85 90   Lipid Profile: No results for input(s): CHOL, HDL, LDLCALC, TRIG, CHOLHDL, LDLDIRECT in the last 72 hours. Thyroid Function Tests: No results for input(s): TSH, T4TOTAL, FREET4, T3FREE, THYROIDAB in the last 72 hours. Anemia Panel: No results for input(s): VITAMINB12, FOLATE, FERRITIN, TIBC, IRON, RETICCTPCT in the last 72 hours. Sepsis Labs: No results for input(s): PROCALCITON, LATICACIDVEN in the last 168 hours.  Recent Results (from the past 240 hour(s))  SARS Coronavirus 2 by RT PCR (hospital order, performed in [facility] hospital lab) Nasopharyngeal Nasopharyngeal Swab     Status: None   Collection Time: 10/22/19 12:55 PM   Specimen: Nasopharyngeal Swab  Result Value Ref Range Status   SARS Coronavirus 2 NEGATIVE NEGATIVE Final    Comment: (NOTE) SARS-CoV-2 target nucleic acids are NOT DETECTED. The SARS-CoV-2 RNA is generally detectable in upper and lower respiratory specimens during the acute phase of infection. The lowest concentration of SARS-CoV-2 viral copies this assay can detect is 250 copies / mL. A negative result does not preclude SARS-CoV-2 infection and should not be used as the sole basis for treatment or other patient management decisions.  A negative result may occur with improper specimen collection / handling, submission of specimen other than nasopharyngeal swab,  presence of viral mutation(s) within the areas targeted by this assay, and inadequate number of viral copies (<250 copies / mL). A negative result must be combined with clinical observations, patient history, and epidemiological information. Fact Sheet for Patients:   StrictlyIdeas.no Fact Sheet for Healthcare Providers: BankingDealers.co.za This test is not yet approved or cleared  by the [place] FDA and has been authorized for detection and/or diagnosis of SARS-CoV-2 by FDA under an Emergency Use Authorization (EUA).  This EUA will remain in effect (meaning this test can be used) for the duration of the COVID-19 declaration under Section 564(b)(1) of the Act,  21 U.S.C. section 360bbb-3(b)(1), unless the authorization is terminated or revoked sooner. Performed at [facility], [address] [place], [place] [address]   MRSA PCR Screening     Status: None   Collection Time: 10/23/19  8:13 PM   Specimen: Nasal Mucosa; Nasopharyngeal  Result Value Ref Range Status   MRSA by PCR NEGATIVE NEGATIVE Final    Comment:        The GeneXpert MRSA Assay (FDA approved for NASAL specimens only), is one component of a comprehensive MRSA colonization surveillance program. It is not intended to diagnose MRSA infection nor to guide or monitor treatment for MRSA infections. Performed at [facility], [address] [place], [place] [address]   Body fluid culture     Status: None (Preliminary result)   Collection Time: 10/29/19 11:52 PM   Specimen: PATH Cytology Pleural fluid  Result Value Ref Range Status   Specimen Description FLUID PLEURAL  Final   Special Requests NONE  Final   Gram Stain   Final    NO WBC SEEN NO ORGANISMS SEEN Performed at [facility], [address], [place], [place] [address]    Culture PENDING  Incomplete   Report Status PENDING  Incomplete         Radiology Studies: CT  CHEST W CONTRAST  Result Date: 10/30/2019 CLINICAL DATA:  Left pleural effusion. Thoracentesis yesterday demonstrated bloody fluid. Malignancy suspected. EXAM: CT CHEST WITH CONTRAST TECHNIQUE: Multidetector CT imaging of the chest was performed during intravenous contrast administration. CONTRAST:  76mL OMNIPAQUE IOHEXOL 300 MG/ML  SOLN COMPARISON:  Recent chest x-rays FINDINGS: Cardiovascular: Atherosclerotic changes are seen in the thoracic aorta. The ascending thoracic aorta measures up to 4.1 cm, mildly aneurysmal. The central pulmonary arteries are normal. Coronary artery calcifications involving the right and left coronary arteries. The heart is unremarkable. Mediastinum/Nodes: There is a mildly prominent node in the base of the neck on the left measuring 9 mm in short axis on series 2, image 14, asymmetric to the right. There is adenopathy with a node measuring 13 mm in short axis in the prevascular space. There is a prominent node in the AP window measuring 15 mm on series 2, image 56. No subcarinal adenopathy. No right-sided adenopathy identified. There is a moderate to large left effusion and a tiny right effusion. No pericardial effusion identified. The thyroid is unremarkable. The esophagus is normal. Lungs/Pleura: There is a spiculated mass in the left upper lobe with central cavitation. Platelike opacity extends off the inferior aspect of the mass, likely associated atelectasis. The mass extends to the pleural surface with some adjacent pleural fluid and pleural thickening. The mass appears to measure 3.2 by 1.7 by 1.6 cm in craniocaudal, transverse, and AP dimensions. There is a moderate left-sided pleural effusion with underlying opacity, likely atelectasis. No centrally obstructing mass is identified on the left. There is a small right effusion with associated atelectasis. Emphysematous changes are seen in the lungs. Upper Abdomen: Visualized liver and gallbladder are normal. The right kidney is  relatively atrophic. The left kidney is not visualized. The left adrenal gland is mildly prominent as seen on series 2, image 162 but not fully evaluated. No other abnormalities in the upper abdomen. Musculoskeletal: No chest wall abnormality. No acute or significant osseous findings. IMPRESSION: 1. Spiculated cavitated mass in the left upper lobe is above highly suspicious for malignancy. Linear opacity extending off the inferior mass is likely atelectasis. The mass extends to the left  pleural surface with mild associated pleural thickening and fluid. Given history, the large left pleural effusion is likely malignant. 2. Adenopathy in the mediastinum worrisome for metastatic disease. A PET-CT could better evaluate. 3. 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 4. Mildly prominent left adrenal gland, incompletely evaluated. Recommend attention on PET-CT. 5. Coronary artery calcifications. 6. Atelectasis in the bases. 7. Emphysematous changes in the lungs. Aortic Atherosclerosis (ICD10-I70.0) and Emphysema (ICD10-J43.9). Electronically Signed   By: Dorise Bullion III M.D   On: 10/30/2019 10:57   DG Chest Port 1 View  Result Date: 10/29/2019 CLINICAL DATA:  74 year old male status post thoracentesis. EXAM: PORTABLE CHEST 1 VIEW COMPARISON:  Earlier radiograph dated 10/29/2019. FINDINGS: Interval decrease in the size of the left pleural effusion with moderate residual effusion. There is aerated portion of the left upper lobe. The right lung is clear. No pneumothorax. Stable cardiomediastinal silhouette. No acute osseous pathology. IMPRESSION: Interval decrease in the size of the left pleural effusion with moderate residual effusion. No pneumothorax. Electronically Signed   By: Anner Crete M.D.   On: 10/29/2019 23:34   DG Chest Port 1 View  Result Date: 10/29/2019 CLINICAL DATA:  Dyspnea. Abdominal pain and vomiting while at dialysis today, unable to get complete  treatment. EXAM: PORTABLE CHEST 1 VIEW COMPARISON:  Radiograph 10/22/2019 FINDINGS: Progressive left pleural effusion with near complete opacification of the left hemithorax, small portion of left apical lung is aerated. There is rightward mediastinal shift suggesting large fluid volume rather than compressive atelectasis. Mediastinal contours are obscured by left lung opacity. Probable right perihilar atelectasis. No significant right pleural effusion. No pneumothorax. IMPRESSION: Progressive left pleural effusion over the past week with near complete opacification of the left hemithorax. Rightward mediastinal shift suggesting large fluid volume volume rather than passive atelectasis. Electronically Signed   By: Keith Rake M.D.   On: 10/29/2019 20:45   US THORACENTESIS ASP PLEURAL SPACE W/IMG GUIDE  Result Date: 10/29/2019 INDICATION: 74 year old with shortness of breath and large left pleural effusion. EXAM: ULTRASOUND GUIDED LEFT THORACENTESIS MEDICATIONS: None. COMPLICATIONS: None immediate. PROCEDURE: An ultrasound guided thoracentesis was thoroughly discussed with the patient and questions answered. The benefits, risks, alternatives and complications were also discussed. The patient understands and wishes to proceed with the procedure. Written consent was obtained. Ultrasound was performed to localize and mark an adequate pocket of fluid in the left chest. The area was then prepped and draped in the normal sterile fashion. 1% Lidocaine was used for local anesthesia. Under ultrasound guidance a 6 Fr Safe-T-Centesis catheter was introduced. Thoracentesis was performed. The catheter was removed and a dressing applied. FINDINGS: A total of approximately 2.35 L of bloody fluid was removed. Samples were sent to the laboratory as requested by the clinical team. IMPRESSION: Successful ultrasound guided left thoracentesis yielding 2.35 L of pleural fluid. Electronically Signed   By: Markus Daft M.D.   On:  10/29/2019 23:26        Scheduled Meds: . heparin  5,000 Units Subcutaneous Q8H  . insulin aspart  0-5 Units Subcutaneous QHS  . insulin aspart  0-6 Units Subcutaneous TID WC  . iohexol  500 mL Oral Q1 Hr x 2   Continuous Infusions:   LOS: 1 day    Time spent:40 min    Oniel Meleski, Geraldo Docker, MD [facility] Pager [phone]  If 7PM-7AM, please contact night-coverage [email or web address] Password [email or web address] 10/30/2019, 3:59 PM

## 2019-10-30 NOTE — Progress Notes (Signed)
[facility]  ROUNDING NOTE   Subjective:  Patient came in with significant shortness of breath. Found to have large left pleural effusion. Underwent thoracentesis for the same with 2.3 L removed. Patient did undergo dialysis yesterday.   Objective:  Vital signs in last 24 hours:  Temp:  [97.8 F (36.6 C)-98.5 F (36.9 C)] 98.5 F (36.9 C) (05/30 0554) Pulse Rate:  [49-89] 64 (05/30 0554) Resp:  [10-21] 16 (05/30 0554) BP: (98-145)/(50-99) 145/73 (05/30 0554) SpO2:  [83 %-100 %] 99 % (05/30 0554) Weight:  [54 kg] 54 kg (05/29 1349)  Weight change:  Filed Weights   10/29/19 1349  Weight: 54 kg    Intake/Output: I/O last 3 completed shifts: In: 120 [P.O.:120] Out: -    Intake/Output this shift:  No intake/output data recorded.  Physical Exam: General: No acute distress  Head: Normocephalic, atraumatic. Moist oral mucosal membranes  Eyes: Anicteric  Neck: Supple, trachea midline  Lungs:  Slightly diminished at left base, normal effort otherwise  Heart: S1S2 no rubs  Abdomen:  Soft, nontender, bowel sounds present  Extremities: No peripheral edema.  Neurologic: Awake, alert, following commands  Skin: No lesions  Access: LUE AV access    Basic Metabolic Panel: Recent Labs  Lab 10/24/19 0418 10/29/19 1403 10/30/19 0207  NA 135 139 142  K 3.5 3.3* 4.3  CL 98 98 101  CO2 28 24 29   GLUCOSE 125* 199* 95  BUN 38* 59* 68*  CREATININE 5.96* 6.95* 8.26*  CALCIUM 8.3* 8.5* 7.2*  MG 1.9  --   --     Liver Function Tests: Recent Labs  Lab 10/29/19 1403  AST 17  ALT 11  ALKPHOS 49  BILITOT 1.0  PROT 7.2  ALBUMIN 3.3*   Recent Labs  Lab 10/29/19 1403  LIPASE 41   No results for input(s): AMMONIA in the last 168 hours.  CBC: Recent Labs  Lab 10/24/19 0418 10/29/19 1403 10/30/19 0207  WBC 10.7* 15.0* 12.8*  NEUTROABS 7.9*  --   --   HGB 11.7* 12.8* 12.2*  HCT 35.0* 37.2* 36.7*  MCV 99.7 95.6 100.3*  PLT 161 279 249    Cardiac  Enzymes: No results for input(s): CKTOTAL, CKMB, CKMBINDEX, TROPONINI in the last 168 hours.  BNP: Invalid input(s): POCBNP  CBG: Recent Labs  Lab 10/23/19 1641 10/23/19 2224 10/24/19 0806 10/24/19 1144 10/30/19 0139  GLUCAP 100* 144* 116* 159* 80    Microbiology: Results for orders placed or performed during the hospital encounter of 10/29/19  Body fluid culture     Status: None (Preliminary result)   Collection Time: 10/29/19 11:52 PM   Specimen: PATH Cytology Pleural fluid  Result Value Ref Range Status   Specimen Description FLUID PLEURAL  Final   Special Requests NONE  Final   Gram Stain   Final    NO WBC SEEN NO ORGANISMS SEEN Performed at [facility], [address], [place], [place] [address]    Culture PENDING  Incomplete   Report Status PENDING  Incomplete    Coagulation Studies: Recent Labs    10/29/19 2215-08-15  LABPROT 13.4  INR 1.1    Urinalysis: No results for input(s): COLORURINE, LABSPEC, PHURINE, GLUCOSEU, HGBUR, BILIRUBINUR, KETONESUR, PROTEINUR, UROBILINOGEN, NITRITE, LEUKOCYTESUR in the last 72 hours.  Invalid input(s): APPERANCEUR    Imaging: DG Chest Port 1 View  Result Date: 10/29/2019 CLINICAL DATA:  74 year old male status post thoracentesis. EXAM: PORTABLE CHEST 1 VIEW COMPARISON:  Earlier radiograph dated  10/29/2019. FINDINGS: Interval decrease in the size of the left pleural effusion with moderate residual effusion. There is aerated portion of the left upper lobe. The right lung is clear. No pneumothorax. Stable cardiomediastinal silhouette. No acute osseous pathology. IMPRESSION: Interval decrease in the size of the left pleural effusion with moderate residual effusion. No pneumothorax. Electronically Signed   By: Anner Crete M.D.   On: 10/29/2019 23:34   DG Chest Port 1 View  Result Date: 10/29/2019 CLINICAL DATA:  Dyspnea. Abdominal pain and vomiting while at dialysis today, unable to get complete treatment. EXAM: PORTABLE  CHEST 1 VIEW COMPARISON:  Radiograph 10/22/2019 FINDINGS: Progressive left pleural effusion with near complete opacification of the left hemithorax, small portion of left apical lung is aerated. There is rightward mediastinal shift suggesting large fluid volume rather than compressive atelectasis. Mediastinal contours are obscured by left lung opacity. Probable right perihilar atelectasis. No significant right pleural effusion. No pneumothorax. IMPRESSION: Progressive left pleural effusion over the past week with near complete opacification of the left hemithorax. Rightward mediastinal shift suggesting large fluid volume volume rather than passive atelectasis. Electronically Signed   By: Keith Rake M.D.   On: 10/29/2019 20:45   US THORACENTESIS ASP PLEURAL SPACE W/IMG GUIDE  Result Date: 10/29/2019 INDICATION: 74 year old with shortness of breath and large left pleural effusion. EXAM: ULTRASOUND GUIDED LEFT THORACENTESIS MEDICATIONS: None. COMPLICATIONS: None immediate. PROCEDURE: An ultrasound guided thoracentesis was thoroughly discussed with the patient and questions answered. The benefits, risks, alternatives and complications were also discussed. The patient understands and wishes to proceed with the procedure. Written consent was obtained. Ultrasound was performed to localize and mark an adequate pocket of fluid in the left chest. The area was then prepped and draped in the normal sterile fashion. 1% Lidocaine was used for local anesthesia. Under ultrasound guidance a 6 Fr Safe-T-Centesis catheter was introduced. Thoracentesis was performed. The catheter was removed and a dressing applied. FINDINGS: A total of approximately 2.35 L of bloody fluid was removed. Samples were sent to the laboratory as requested by the clinical team. IMPRESSION: Successful ultrasound guided left thoracentesis yielding 2.35 L of pleural fluid. Electronically Signed   By: Markus Daft M.D.   On: 10/29/2019 23:26      Medications:    . heparin  5,000 Units Subcutaneous Q8H  . insulin aspart  0-5 Units Subcutaneous QHS  . insulin aspart  0-6 Units Subcutaneous TID WC   ondansetron **OR** ondansetron (ZOFRAN) IV  Assessment/ Plan:  74 y.o. male with past medical history of ESRD on HD, diabetes mellitus type 2, history of prostate cancer, history of CVA, peripheral vascular disease, bilateral transmetatarsal amputation, anemia of chronic kidney disease, secondary hyperparathyroidism who was admitted with recurrent left pleural effusion.  [facility] [facility] AVF  1.  ESRD on HD TTS.  Patient underwent dialysis yesterday.  Tolerated well.  No acute indication for dialysis today.  Next office treatment on Tuesday if still here.  2.  Anemia of chronic kidney disease.  Hemoglobin currently 12.2.  Hold off on Epogen at the moment.  3.  Secondary hyperparathyroidism.  Check PTH, phosphorus, calcium and next dialysis treatment.  4.  Left pleural effusion.  Recurrent in nature.  Underwent thoracentesis yesterday removing 2.3 L of bloody fluid.  Consider pulmonary and thoracic surgery consultations.  Consider CT chest given recurrent pleural effusion.   LOS: 1 Jordyan Hardiman 5/30/20218:33 AM

## 2019-10-30 NOTE — ED Notes (Signed)
Patient resting quietly at this time.

## 2019-10-31 LAB — COMPREHENSIVE METABOLIC PANEL
ALT: 9 U/L (ref 0–44)
AST: 14 U/L — ABNORMAL LOW (ref 15–41)
Albumin: 2.7 g/dL — ABNORMAL LOW (ref 3.5–5.0)
Alkaline Phosphatase: 43 U/L (ref 38–126)
Anion gap: 13 (ref 5–15)
BUN: 74 mg/dL — ABNORMAL HIGH (ref 8–23)
CO2: 26 mmol/L (ref 22–32)
Calcium: 7.5 mg/dL — ABNORMAL LOW (ref 8.9–10.3)
Chloride: 97 mmol/L — ABNORMAL LOW (ref 98–111)
Creatinine, Ser: 8.49 mg/dL — ABNORMAL HIGH (ref 0.61–1.24)
GFR calc Af Amer: 6 mL/min — ABNORMAL LOW (ref >60)
GFR calc non Af Amer: 6 mL/min — ABNORMAL LOW (ref >60)
Glucose, Bld: 64 mg/dL — ABNORMAL LOW (ref 70–99)
Potassium: 4.6 mmol/L (ref 3.5–5.1)
Sodium: 136 mmol/L (ref 135–145)
Total Bilirubin: 0.7 mg/dL (ref 0.3–1.2)
Total Protein: 6.2 g/dL — ABNORMAL LOW (ref 6.5–8.1)

## 2019-10-31 LAB — CBC WITH DIFFERENTIAL/PLATELET
Abs Immature Granulocytes: 0.07 10*3/uL (ref 0.00–0.07)
Basophils Absolute: 0.1 10*3/uL (ref 0.0–0.1)
Basophils Relative: 1 %
Eosinophils Absolute: 0.2 10*3/uL (ref 0.0–0.5)
Eosinophils Relative: 2 %
HCT: 34.5 % — ABNORMAL LOW (ref 39.0–52.0)
Hemoglobin: 11.3 g/dL — ABNORMAL LOW (ref 13.0–17.0)
Immature Granulocytes: 1 %
Lymphocytes Relative: 16 %
Lymphs Abs: 1.6 10*3/uL (ref 0.7–4.0)
MCH: 33 pg (ref 26.0–34.0)
MCHC: 32.8 g/dL (ref 30.0–36.0)
MCV: 100.9 fL — ABNORMAL HIGH (ref 80.0–100.0)
Monocytes Absolute: 0.9 10*3/uL (ref 0.1–1.0)
Monocytes Relative: 9 %
Neutro Abs: 6.8 10*3/uL (ref 1.7–7.7)
Neutrophils Relative %: 71 %
Platelets: 245 10*3/uL (ref 150–400)
RBC: 3.42 MIL/uL — ABNORMAL LOW (ref 4.22–5.81)
RDW: 14.3 % (ref 11.5–15.5)
WBC: 9.7 10*3/uL (ref 4.0–10.5)
nRBC: 0 % (ref 0.0–0.2)

## 2019-10-31 LAB — PHOSPHORUS: Phosphorus: 5.8 mg/dL — ABNORMAL HIGH (ref 2.5–4.6)

## 2019-10-31 LAB — GLUCOSE, CAPILLARY
Glucose-Capillary: 93 mg/dL (ref 70–99)
Glucose-Capillary: 119 mg/dL — ABNORMAL HIGH (ref 70–99)
Glucose-Capillary: 80 mg/dL (ref 70–99)
Glucose-Capillary: 81 mg/dL (ref 70–99)

## 2019-10-31 LAB — MAGNESIUM: Magnesium: 2.1 mg/dL (ref 1.7–2.4)

## 2019-10-31 NOTE — Progress Notes (Signed)
[facility]  ROUNDING NOTE   Subjective:  Patient found to have spiculated left upper lobe lung mass along with mediastinal adenopathy. Awaiting further diagnosis but suspicious for malignancy. Due for dialysis tomorrow.   Objective:  Vital signs in last 24 hours:  Temp:  [97.5 F (36.4 C)-98.7 F (37.1 C)] 97.5 F (36.4 C) (05/31 0759) Pulse Rate:  [66-70] 70 (05/31 0759) Resp:  [14-15] 15 (05/31 0759) BP: (136-145)/(65-78) 145/78 (05/31 0759) SpO2:  [99 %-100 %] 100 % (05/31 0759) Weight:  [55.4 kg] 55.4 kg (05/31 0643)  Weight change: 1.4 kg Filed Weights   10/29/19 1349 10/31/19 0643  Weight: 54 kg 55.4 kg    Intake/Output: I/O last 3 completed shifts: In: 1240 [P.O.:1240] Out: 250 [Urine:250]   Intake/Output this shift:  Total I/O In: 360 [P.O.:360] Out: -   Physical Exam: General: No acute distress  Head: Normocephalic, atraumatic. Moist oral mucosal membranes  Eyes: Anicteric  Neck: Supple, trachea midline  Lungs:  Slightly diminished at left base, normal effort otherwise  Heart: S1S2 no rubs  Abdomen:  Soft, nontender, bowel sounds present  Extremities: No peripheral edema.  Neurologic: Awake, alert, following commands  Skin: No lesions  Access: LUE AV access    Basic Metabolic Panel: Recent Labs  Lab 10/29/19 1403 10/30/19 0207 10/31/19 0332  NA 139 142 136  K 3.3* 4.3 4.6  CL 98 101 97*  CO2 24 29 26   GLUCOSE 199* 95 64*  BUN 59* 68* 74*  CREATININE 6.95* 8.26* 8.49*  CALCIUM 8.5* 7.2* 7.5*  MG  --   --  2.1  PHOS  --   --  5.8*    Liver Function Tests: Recent Labs  Lab 10/29/19 1403 10/31/19 0332  AST 17 14*  ALT 11 9  ALKPHOS 49 43  BILITOT 1.0 0.7  PROT 7.2 6.2*  ALBUMIN 3.3* 2.7*   Recent Labs  Lab 10/29/19 1403  LIPASE 41   No results for input(s): AMMONIA in the last 168 hours.  CBC: Recent Labs  Lab 10/29/19 1403 10/30/19 0207 10/31/19 0332  WBC 15.0* 12.8* 9.7  NEUTROABS  --   --  6.8  HGB  12.8* 12.2* 11.3*  HCT 37.2* 36.7* 34.5*  MCV 95.6 100.3* 100.9*  PLT 279 249 245    Cardiac Enzymes: No results for input(s): CKTOTAL, CKMB, CKMBINDEX, TROPONINI in the last 168 hours.  BNP: Invalid input(s): POCBNP  CBG: Recent Labs  Lab 10/30/19 1647 10/30/19 1844 10/30/19 September 02, 2115 10/31/19 0758 10/31/19 1153  GLUCAP 63* 85 131* 93 119*    Microbiology: Results for orders placed or performed during the hospital encounter of 10/29/19  Body fluid culture     Status: None (Preliminary result)   Collection Time: 10/29/19 11:52 PM   Specimen: PATH Cytology Pleural fluid  Result Value Ref Range Status   Specimen Description FLUID PLEURAL  Final   Special Requests NONE  Final   Gram Stain NO WBC SEEN NO ORGANISMS SEEN   Final   Culture   Final    NO GROWTH 1 DAY Performed at [facility], [place] [address] [place], [place] [address]    Report Status PENDING  Incomplete    Coagulation Studies: Recent Labs    10/29/19 09-02-15  LABPROT 13.4  INR 1.1    Urinalysis: No results for input(s): COLORURINE, LABSPEC, PHURINE, GLUCOSEU, HGBUR, BILIRUBINUR, KETONESUR, PROTEINUR, UROBILINOGEN, NITRITE, LEUKOCYTESUR in the last 72 hours.  Invalid input(s): APPERANCEUR    Imaging: CT  ABDOMEN PELVIS WO CONTRAST  Result Date: 10/30/2019 CLINICAL DATA:  74 year old male with history of prostate cancer. Left upper lobe mass seen on earlier CT. EXAM: CT ABDOMEN AND PELVIS WITHOUT CONTRAST TECHNIQUE: Multidetector CT imaging of the abdomen and pelvis was performed following the standard protocol without IV contrast. COMPARISON:  Chest CT dated 10/30/2019. FINDINGS: Evaluation of this exam is limited in the absence of intravenous contrast. Lower chest: Please see report for chest CT of 10/30/2019. No intra-abdominal free air or free fluid. Hepatobiliary: The liver is grossly unremarkable. No intrahepatic biliary ductal dilatation. There is irregular and masslike thickening of the  gallbladder wall concerning for primary neoplasm or metastatic disease. Further initial evaluation with ultrasound recommended. MRI may provide better evaluation depending on the ultrasound findings. No calcified gallstone or pericholecystic fluid. Pancreas: Unremarkable. No pancreatic ductal dilatation or surrounding inflammatory changes. Spleen: Normal in size without focal abnormality. Adrenals/Urinary Tract: Indeterminate bilateral adrenal thickening or hyperplasia. Moderate bilateral renal parenchyma atrophy. There is a 15 mm left renal hypodense lesion which demonstrates fluid attenuation, likely a cyst. Additional smaller hypodense lesions are too small to characterize. There is no hydronephrosis on either side. The visualized ureters appear unremarkable. Excreted contrast from recent CT is noted within the bladder. Stomach/Bowel: There is extensive sigmoid diverticulosis and scattered colonic diverticula without active inflammatory changes. There is no bowel obstruction or active inflammation. The appendix is normal. Vascular/Lymphatic: Advanced aortoiliac atherosclerotic disease. Diffuse ectasia of the abdominal aorta with borderline aneurysmal dilatation of the infrarenal abdominal aorta measuring up to 3 cm in diameter. The IVC is unremarkable. Bilateral common iliac artery stents noted. No portal venous gas. There is no adenopathy. Reproductive: Prostate brachytherapy seeds. Other: Paucity of subcutaneous fat. Musculoskeletal: Degenerative changes of the spine with findings of renal osteodystrophy. No acute osseous pathology. Mild bilateral femoral head avascular necrosis. IMPRESSION: 1. Irregular and masslike thickening of the gallbladder wall concerning for primary neoplasm or metastatic disease. Further initial evaluation with ultrasound recommended. 2. Extensive colonic diverticulosis. No bowel obstruction. Normal appendix. 3. Aortic Atherosclerosis (ICD10-I70.0). 4. A 3 cm infrarenal abdominal  aortic aneurysm. Recommend followup by ultrasound in 3 years. This recommendation follows ACR consensus guidelines: White Paper of the ACR Incidental Findings Committee II on Vascular Findings. J Am Coll Radiol 2013; 10:789-794. Aortic aneurysm NOS (ICD10-I71.9) Electronically Signed   By: Anner Crete M.D.   On: 10/30/2019 19:40   CT CHEST W CONTRAST  Result Date: 10/30/2019 CLINICAL DATA:  Left pleural effusion. Thoracentesis yesterday demonstrated bloody fluid. Malignancy suspected. EXAM: CT CHEST WITH CONTRAST TECHNIQUE: Multidetector CT imaging of the chest was performed during intravenous contrast administration. CONTRAST:  72mL OMNIPAQUE IOHEXOL 300 MG/ML  SOLN COMPARISON:  Recent chest x-rays FINDINGS: Cardiovascular: Atherosclerotic changes are seen in the thoracic aorta. The ascending thoracic aorta measures up to 4.1 cm, mildly aneurysmal. The central pulmonary arteries are normal. Coronary artery calcifications involving the right and left coronary arteries. The heart is unremarkable. Mediastinum/Nodes: There is a mildly prominent node in the base of the neck on the left measuring 9 mm in short axis on series 2, image 14, asymmetric to the right. There is adenopathy with a node measuring 13 mm in short axis in the prevascular space. There is a prominent node in the AP window measuring 15 mm on series 2, image 56. No subcarinal adenopathy. No right-sided adenopathy identified. There is a moderate to large left effusion and a tiny right effusion. No pericardial effusion identified. The thyroid is unremarkable. The esophagus  is normal. Lungs/Pleura: There is a spiculated mass in the left upper lobe with central cavitation. Platelike opacity extends off the inferior aspect of the mass, likely associated atelectasis. The mass extends to the pleural surface with some adjacent pleural fluid and pleural thickening. The mass appears to measure 3.2 by 1.7 by 1.6 cm in craniocaudal, transverse, and AP  dimensions. There is a moderate left-sided pleural effusion with underlying opacity, likely atelectasis. No centrally obstructing mass is identified on the left. There is a small right effusion with associated atelectasis. Emphysematous changes are seen in the lungs. Upper Abdomen: Visualized liver and gallbladder are normal. The right kidney is relatively atrophic. The left kidney is not visualized. The left adrenal gland is mildly prominent as seen on series 2, image 162 but not fully evaluated. No other abnormalities in the upper abdomen. Musculoskeletal: No chest wall abnormality. No acute or significant osseous findings. IMPRESSION: 1. Spiculated cavitated mass in the left upper lobe is above highly suspicious for malignancy. Linear opacity extending off the inferior mass is likely atelectasis. The mass extends to the left pleural surface with mild associated pleural thickening and fluid. Given history, the large left pleural effusion is likely malignant. 2. Adenopathy in the mediastinum worrisome for metastatic disease. A PET-CT could better evaluate. 3. 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 4. Mildly prominent left adrenal gland, incompletely evaluated. Recommend attention on PET-CT. 5. Coronary artery calcifications. 6. Atelectasis in the bases. 7. Emphysematous changes in the lungs. Aortic Atherosclerosis (ICD10-I70.0) and Emphysema (ICD10-J43.9). Electronically Signed   By: Dorise Bullion III M.D   On: 10/30/2019 10:57   DG Chest Port 1 View  Result Date: 10/29/2019 CLINICAL DATA:  74 year old male status post thoracentesis. EXAM: PORTABLE CHEST 1 VIEW COMPARISON:  Earlier radiograph dated 10/29/2019. FINDINGS: Interval decrease in the size of the left pleural effusion with moderate residual effusion. There is aerated portion of the left upper lobe. The right lung is clear. No pneumothorax. Stable cardiomediastinal silhouette. No acute osseous pathology.  IMPRESSION: Interval decrease in the size of the left pleural effusion with moderate residual effusion. No pneumothorax. Electronically Signed   By: Anner Crete M.D.   On: 10/29/2019 23:34   DG Chest Port 1 View  Result Date: 10/29/2019 CLINICAL DATA:  Dyspnea. Abdominal pain and vomiting while at dialysis today, unable to get complete treatment. EXAM: PORTABLE CHEST 1 VIEW COMPARISON:  Radiograph 10/22/2019 FINDINGS: Progressive left pleural effusion with near complete opacification of the left hemithorax, small portion of left apical lung is aerated. There is rightward mediastinal shift suggesting large fluid volume rather than compressive atelectasis. Mediastinal contours are obscured by left lung opacity. Probable right perihilar atelectasis. No significant right pleural effusion. No pneumothorax. IMPRESSION: Progressive left pleural effusion over the past week with near complete opacification of the left hemithorax. Rightward mediastinal shift suggesting large fluid volume volume rather than passive atelectasis. Electronically Signed   By: Keith Rake M.D.   On: 10/29/2019 20:45   US THORACENTESIS ASP PLEURAL SPACE W/IMG GUIDE  Result Date: 10/29/2019 INDICATION: 74 year old with shortness of breath and large left pleural effusion. EXAM: ULTRASOUND GUIDED LEFT THORACENTESIS MEDICATIONS: None. COMPLICATIONS: None immediate. PROCEDURE: An ultrasound guided thoracentesis was thoroughly discussed with the patient and questions answered. The benefits, risks, alternatives and complications were also discussed. The patient understands and wishes to proceed with the procedure. Written consent was obtained. Ultrasound was performed to localize and mark an adequate pocket of fluid in the left chest.  The area was then prepped and draped in the normal sterile fashion. 1% Lidocaine was used for local anesthesia. Under ultrasound guidance a 6 Fr Safe-T-Centesis catheter was introduced. Thoracentesis was  performed. The catheter was removed and a dressing applied. FINDINGS: A total of approximately 2.35 L of bloody fluid was removed. Samples were sent to the laboratory as requested by the clinical team. IMPRESSION: Successful ultrasound guided left thoracentesis yielding 2.35 L of pleural fluid. Electronically Signed   By: Markus Daft M.D.   On: 10/29/2019 23:26     Medications:    . heparin  5,000 Units Subcutaneous Q8H  . insulin aspart  0-5 Units Subcutaneous QHS  . insulin aspart  0-6 Units Subcutaneous TID WC  . nicotine  21 mg Transdermal Daily   ondansetron **OR** ondansetron (ZOFRAN) IV  Assessment/ Plan:  74 y.o. male with past medical history of ESRD on HD, diabetes mellitus type 2, history of prostate cancer, history of CVA, peripheral vascular disease, bilateral transmetatarsal amputation, anemia of chronic kidney disease, secondary hyperparathyroidism who was admitted with recurrent left pleural effusion.  [facility] [facility] AVF  1.  ESRD on HD TTS.  Patient due for dialysis treatment again tomorrow.  No acute indication for dialysis today.  2.  Anemia of chronic kidney disease.  We will need to hold further Epogen given suspected cancer diagnosis.  3.  Secondary hyperparathyroidism.  Check PTH, phosphorus, calcium and next dialysis treatment.  4.  Left pleural effusion/left upper lobe lung mass.  Recurrent in nature.  Underwent thoracentesis 10/29/2019 removing 2.3 L of bloody fluid.  CT chest now reveals spiculated left upper lobe lung mass.  Further work-up per hospitalist.   LOS: 2 Elek Holderness 5/31/20212:11 PM

## 2019-10-31 NOTE — Progress Notes (Addendum)
PROGRESS NOTE    Jose [name]  POE:[id number] DOB: 03-Jun-1945 DOA: 10/29/2019 PCP: Jose Pink, MD     Brief Narrative:  Jose [name] is a 74 y.o.BM PMHx Depression, CVA, ESRD on HD TTS, DM type II controlled with complication, Chronic Diastolic CHF HTN, Prostate cancer , Glaucoma and gout, HLD, tobacco abuse ("1/4bpd forever),  [name] with significant shortness of breath.  Patient's son gave the history that he was at dialysis and started having abdominal pain and then had vomiting.  He did not complete his treatment then.  They took him off early and was brought to the [name].  He was in the hospital recently and discharged on Thursday.  During the hospitalization he was also having significant shortness of breath and cough.  He was treated for productive cough and shortness of breath.  During that time he did have some watery diarrhea.  No fever or chills.  He was noted to have turbid urine suspected to have UTI.  He was treated for pulmonary edema and acute on chronic diastolic heart failure which resolved.  Today however patient was noted to have large left left-sided pleural effusion suspected to be primarily responsible for his symptoms.  He had thoracentesis performed tonight with about 2 L of fluid removed.  He is relatively improved but still short of breath.  Patient being admitted for further treatment in the hospital..  ED Course: Temperature 97.8 blood pressure 130/72 pulse 89 respiratory of 21 oxygen sat 83% on room air currently 95% on 2 L.  White count is 15,000 hemoglobin 12.8 and platelets 279 sodium 139 potassium 3.3 chloride 98.  His CO2 is 24 BUN 59 creatinine 6.95 and calcium 8.5.  Glucose is 199 INR 1.1.  Initial chest x-ray showed progressive left pleural effusion worsening since last week with complete opacification of the left hemithorax.  Subsequent ultrasound-guided thoracentesis performed with about 2 L of fluid removed.   Subjective: 5/31 A/O x4,  negative CP, positive S OB, negative abdominal pain.  Cachectic.  States slowly having increased S OB.    Assessment & Plan:   Principal Problem:   Pleural effusion on left Active Problems:   Type II diabetes mellitus with renal manifestations (HCC)   Hypertension   ESRD on dialysis ([place])   Tobacco abuse   Acute on chronic diastolic CHF (congestive heart failure) (HCC)   SOB (shortness of breath)   Hypokalemia   Chronic diastolic CHF (congestive heart failure) (HCC)   Benign essential HTN   Malignant neoplasm of unknown origin ([place])   Acute respiratory failure with hypoxia (HCC)   End-stage renal disease on hemodialysis ([place])   Diabetes mellitus type 2, controlled, with complications (HCC)  Chronic diastolic CHF -Strict in and out +1.4 L -Daily weight Filed Weights   10/29/19 1349 10/31/19 0643  Weight: 54 kg 55.4 kg  -Currently patient's BP controlled without medication monitor closely  Essential HTN -See CHF  Metastatic lung cancer vs primary lung cancer -Patient is primary versus metastatic cancer in his lung. -CT abdomen pelvis; masslike thickening of gallbladder see results below.   Recurrent LEFT-sided pleural effusion: So far analysis showed transudate.  Awaiting full chemistry as well as Gram stain and culture.  Suspected all due to anasarca from renal failure.  Monitor closely -5/29 LEFT thoracentesis aspirated 2.3 L bloody fluid. -5/30 CT chest pending -5/30 spoke with Levada Dy in [place] [facility] stateed fluid was sent off for pathology/cytology to [facility] earliest we would hear  back any results is Tuesday. -5/31 discussed case with Dr Ottie Glazier PCCM Jose concurs that patient most likely has cancer.  Would like to defer EBUS until results of thoracentesis return. -6/1 PCXR pending   Acute respiratory failure with hypoxia -Per patient has had multiple episodes of pleural effusions requiring draining, never worked up. -See recurrent pleural effusion   ESRD on HD  TTS, -Per EMR partial session of HD on Thursday.    DM type II controlled with complication -4/54 hemoglobin A1c= 5.6 -Ultrasensitive SSI  Goals of care 5/31 palliative care consult; patient with multisystem organ failure, now with neoplasm evaluate patient for change of CODE STATUS, advanced directives, hospice.    DVT prophylaxis: Subcu heparin Code Status: Full Family Communication:  Disposition Plan:  Status is: Inpatient    Dispo: The patient is from: Home              Anticipated d/c is to: Home              Anticipated d/c date is: 7 June              Patient currently unstable      Consultants:    Procedures/Significant Events:  5/29 LEFT thoracentesis; 2.3 L bloody fluid aspirated 5/29 HD 5/30 CT chest W contrast;-spiculated cavitated mass in the left upper lobe is above highly suspicious for malignancy.  -Linear opacity extending off the inferior mass is likely atelectasis.  -The mass extends to the left pleural surface with mild associated pleural thickening and fluid. Given history, the large left pleural effusion is likely malignant. -Adenopathy in the mediastinum worrisome for metastatic disease. A PET-CT could better evaluate. 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 -Mildly prominent left adrenal gland, incompletely evaluated. Recommend attention on PET-CT. -Atelectasis in the bases. -Emphysematous changes in the lungs. 5/31 CT abdomen pelvis W0 contrast;-irregular and masslike thickening of the gallbladder wall concerning for primary neoplasm or metastatic disease. Further initial evaluation with ultrasound recommended. -Extensive colonic diverticulosis. No bowel obstruction. Norma appendix. -3 cm infrarenal abdominal aortic aneurysm. Recommend followup by ultrasound in 3 years.    I have personally reviewed and interpreted all radiology studies and my findings are as above.  VENTILATOR  SETTINGS:    Cultures 5/29 LEFT pleural fluid NGTD    Antimicrobials: Anti-infectives (From admission, onward)   None       Devices    LINES / TUBES:      Continuous Infusions:   Objective: Vitals:   10/30/19 1347 10/30/19 2333 10/31/19 0643 10/31/19 0759  BP: 135/74 136/65  (!) 145/78  Pulse:  66  70  Resp: 18 14  15   Temp: 98.3 F (36.8 C) 98.7 F (37.1 C)  (!) 97.5 F (36.4 C)  TempSrc: Oral Oral  Oral  SpO2: 99% 99%  100%  Weight:   55.4 kg   Height:        Intake/Output Summary (Last 24 hours) at 10/31/2019 0842 Last data filed at 10/31/2019 0981 Gross per 24 hour  Intake 1120 ml  Output 250 ml  Net 870 ml   Filed Weights   10/29/19 1349 10/31/19 0643  Weight: 54 kg 55.4 kg   Physical Exam:  General: A/O x4, positive acute respiratory distress, cachectic Eyes: negative scleral hemorrhage, negative anisocoria, negative icterus ENT: Negative Runny nose, negative gingival bleeding, Neck:  Negative scars, masses, torticollis, lymphadenopathy, JVD Lungs: Decreased breath sounds left lower lobe, without wheezes or crackles Cardiovascular: Regular rate  and rhythm without murmur gallop or rub normal S1 and S2 Abdomen: negative abdominal pain, nondistended, positive soft, bowel sounds, no rebound, no ascites, no appreciable mass Extremities: No significant cyanosis, clubbing, or edema bilateral lower extremities Skin: Negative rashes, lesions, ulcers Psychiatric:  Negative depression, negative anxiety, negative fatigue, negative mania  Central nervous system:  Cranial nerves II through XII intact, tongue/uvula midline, all extremities muscle strength 5/5, sensation intact throughout, negative dysarthria, negative expressive aphasia, negative receptive aphasia.  .     Data Reviewed: Care during the described time interval was provided by me .  I have reviewed this patient's available data, including medical history, events of note, physical  examination, and all test results as part of my evaluation.  CBC: Recent Labs  Lab 10/29/19 1403 10/30/19 0207 10/31/19 0332  WBC 15.0* 12.8* 9.7  NEUTROABS  --   --  6.8  HGB 12.8* 12.2* 11.3*  HCT 37.2* 36.7* 34.5*  MCV 95.6 100.3* 100.9*  PLT 279 249 426   Basic Metabolic Panel: Recent Labs  Lab 10/29/19 1403 10/30/19 0207 10/31/19 0332  NA 139 142 136  K 3.3* 4.3 4.6  CL 98 101 97*  CO2 24 29 26   GLUCOSE 199* 95 64*  BUN 59* 68* 74*  CREATININE 6.95* 8.26* 8.49*  CALCIUM 8.5* 7.2* 7.5*  MG  --   --  2.1  PHOS  --   --  5.8*   GFR: Estimated Creatinine Clearance: 6.1 mL/min (A) (by C-G formula based on SCr of 8.49 mg/dL (H)). Liver Function Tests: Recent Labs  Lab 10/29/19 1403 10/31/19 0332  AST 17 14*  ALT 11 9  ALKPHOS 49 43  BILITOT 1.0 0.7  PROT 7.2 6.2*  ALBUMIN 3.3* 2.7*   Recent Labs  Lab 10/29/19 1403  LIPASE 41   No results for input(s): AMMONIA in the last 168 hours. Coagulation Profile: Recent Labs  Lab 10/29/19 2217  INR 1.1   Cardiac Enzymes: No results for input(s): CKTOTAL, CKMB, CKMBINDEX, TROPONINI in the last 168 hours. BNP (last 3 results) No results for input(s): PROBNP in the last 8760 hours. HbA1C: Recent Labs    10/30/19 0207  HGBA1C 5.6   CBG: Recent Labs  Lab 10/30/19 1145 10/30/19 1647 10/30/19 1844 10/30/19 2117 10/31/19 0758  GLUCAP 90 63* 85 131* 93   Lipid Profile: No results for input(s): CHOL, HDL, LDLCALC, TRIG, CHOLHDL, LDLDIRECT in the last 72 hours. Thyroid Function Tests: No results for input(s): TSH, T4TOTAL, FREET4, T3FREE, THYROIDAB in the last 72 hours. Anemia Panel: No results for input(s): VITAMINB12, FOLATE, FERRITIN, TIBC, IRON, RETICCTPCT in the last 72 hours. Sepsis Labs: No results for input(s): PROCALCITON, LATICACIDVEN in the last 168 hours.  Recent Results (from the past 240 hour(s))  SARS Coronavirus 2 by RT PCR (hospital order, performed in [facility] hospital lab)  Nasopharyngeal Nasopharyngeal Swab     Status: None   Collection Time: 10/22/19 12:55 PM   Specimen: Nasopharyngeal Swab  Result Value Ref Range Status   SARS Coronavirus 2 NEGATIVE NEGATIVE Final    Comment: (NOTE) SARS-CoV-2 target nucleic acids are NOT DETECTED. The SARS-CoV-2 RNA is generally detectable in upper and lower respiratory specimens during the acute phase of infection. The lowest concentration of SARS-CoV-2 viral copies this assay can detect is 250 copies / mL. A negative result does not preclude SARS-CoV-2 infection and should not be used as the sole basis for treatment or other patient management decisions.  A negative result may occur  with improper specimen collection / handling, submission of specimen other than nasopharyngeal swab, presence of viral mutation(s) within the areas targeted by this assay, and inadequate number of viral copies (<250 copies / mL). A negative result must be combined with clinical observations, patient history, and epidemiological information. Fact Sheet for Patients:   StrictlyIdeas.no Fact Sheet for Healthcare Providers: BankingDealers.co.za This test is not yet approved or cleared  by the [place] FDA and has been authorized for detection and/or diagnosis of SARS-CoV-2 by FDA under an Emergency Use Authorization (EUA).  This EUA will remain in effect (meaning this test can be used) for the duration of the COVID-19 declaration under Section 564(b)(1) of the Act, 21 U.S.C. section 360bbb-3(b)(1), unless the authorization is terminated or revoked sooner. Performed at [facility], [address] [place], [place] [address]   MRSA PCR Screening     Status: None   Collection Time: 10/23/19  8:13 PM   Specimen: Nasal Mucosa; Nasopharyngeal  Result Value Ref Range Status   MRSA by PCR NEGATIVE NEGATIVE Final    Comment:        The GeneXpert MRSA Assay (FDA approved for NASAL  specimens only), is one component of a comprehensive MRSA colonization surveillance program. It is not intended to diagnose MRSA infection nor to guide or monitor treatment for MRSA infections. Performed at [facility], [address] [place], [place] [address]   Body fluid culture     Status: None (Preliminary result)   Collection Time: 10/29/19 11:52 PM   Specimen: PATH Cytology Pleural fluid  Result Value Ref Range Status   Specimen Description FLUID PLEURAL  Final   Special Requests NONE  Final   Gram Stain NO WBC SEEN NO ORGANISMS SEEN   Final   Culture   Final    NO GROWTH 1 DAY Performed at [facility], [place] [address], [place], [place] [address]    Report Status PENDING  Incomplete         Radiology Studies: CT ABDOMEN PELVIS WO CONTRAST  Result Date: 10/30/2019 CLINICAL DATA:  74 year old male with history of prostate cancer. Left upper lobe mass seen on earlier CT. EXAM: CT ABDOMEN AND PELVIS WITHOUT CONTRAST TECHNIQUE: Multidetector CT imaging of the abdomen and pelvis was performed following the standard protocol without IV contrast. COMPARISON:  Chest CT dated 10/30/2019. FINDINGS: Evaluation of this exam is limited in the absence of intravenous contrast. Lower chest: Please see report for chest CT of 10/30/2019. No intra-abdominal free air or free fluid. Hepatobiliary: The liver is grossly unremarkable. No intrahepatic biliary ductal dilatation. There is irregular and masslike thickening of the gallbladder wall concerning for primary neoplasm or metastatic disease. Further initial evaluation with ultrasound recommended. MRI may provide better evaluation depending on the ultrasound findings. No calcified gallstone or pericholecystic fluid. Pancreas: Unremarkable. No pancreatic ductal dilatation or surrounding inflammatory changes. Spleen: Normal in size without focal abnormality. Adrenals/Urinary Tract: Indeterminate bilateral adrenal thickening or  hyperplasia. Moderate bilateral renal parenchyma atrophy. There is a 15 mm left renal hypodense lesion which demonstrates fluid attenuation, likely a cyst. Additional smaller hypodense lesions are too small to characterize. There is no hydronephrosis on either side. The visualized ureters appear unremarkable. Excreted contrast from recent CT is noted within the bladder. Stomach/Bowel: There is extensive sigmoid diverticulosis and scattered colonic diverticula without active inflammatory changes. There is no bowel obstruction or active inflammation. The appendix is normal. Vascular/Lymphatic: Advanced aortoiliac atherosclerotic disease. Diffuse ectasia of the abdominal  aorta with borderline aneurysmal dilatation of the infrarenal abdominal aorta measuring up to 3 cm in diameter. The IVC is unremarkable. Bilateral common iliac artery stents noted. No portal venous gas. There is no adenopathy. Reproductive: Prostate brachytherapy seeds. Other: Paucity of subcutaneous fat. Musculoskeletal: Degenerative changes of the spine with findings of renal osteodystrophy. No acute osseous pathology. Mild bilateral femoral head avascular necrosis. IMPRESSION: 1. Irregular and masslike thickening of the gallbladder wall concerning for primary neoplasm or metastatic disease. Further initial evaluation with ultrasound recommended. 2. Extensive colonic diverticulosis. No bowel obstruction. Normal appendix. 3. Aortic Atherosclerosis (ICD10-I70.0). 4. A 3 cm infrarenal abdominal aortic aneurysm. Recommend followup by ultrasound in 3 years. This recommendation follows ACR consensus guidelines: White Paper of the ACR Incidental Findings Committee II on Vascular Findings. J Am Coll Radiol 2013; 10:789-794. Aortic aneurysm NOS (ICD10-I71.9) Electronically Signed   By: Anner Crete M.D.   On: 10/30/2019 19:40   CT CHEST W CONTRAST  Result Date: 10/30/2019 CLINICAL DATA:  Left pleural effusion. Thoracentesis yesterday demonstrated  bloody fluid. Malignancy suspected. EXAM: CT CHEST WITH CONTRAST TECHNIQUE: Multidetector CT imaging of the chest was performed during intravenous contrast administration. CONTRAST:  30mL OMNIPAQUE IOHEXOL 300 MG/ML  SOLN COMPARISON:  Recent chest x-rays FINDINGS: Cardiovascular: Atherosclerotic changes are seen in the thoracic aorta. The ascending thoracic aorta measures up to 4.1 cm, mildly aneurysmal. The central pulmonary arteries are normal. Coronary artery calcifications involving the right and left coronary arteries. The heart is unremarkable. Mediastinum/Nodes: There is a mildly prominent node in the base of the neck on the left measuring 9 mm in short axis on series 2, image 14, asymmetric to the right. There is adenopathy with a node measuring 13 mm in short axis in the prevascular space. There is a prominent node in the AP window measuring 15 mm on series 2, image 56. No subcarinal adenopathy. No right-sided adenopathy identified. There is a moderate to large left effusion and a tiny right effusion. No pericardial effusion identified. The thyroid is unremarkable. The esophagus is normal. Lungs/Pleura: There is a spiculated mass in the left upper lobe with central cavitation. Platelike opacity extends off the inferior aspect of the mass, likely associated atelectasis. The mass extends to the pleural surface with some adjacent pleural fluid and pleural thickening. The mass appears to measure 3.2 by 1.7 by 1.6 cm in craniocaudal, transverse, and AP dimensions. There is a moderate left-sided pleural effusion with underlying opacity, likely atelectasis. No centrally obstructing mass is identified on the left. There is a small right effusion with associated atelectasis. Emphysematous changes are seen in the lungs. Upper Abdomen: Visualized liver and gallbladder are normal. The right kidney is relatively atrophic. The left kidney is not visualized. The left adrenal gland is mildly prominent as seen on series 2,  image 162 but not fully evaluated. No other abnormalities in the upper abdomen. Musculoskeletal: No chest wall abnormality. No acute or significant osseous findings. IMPRESSION: 1. Spiculated cavitated mass in the left upper lobe is above highly suspicious for malignancy. Linear opacity extending off the inferior mass is likely atelectasis. The mass extends to the left pleural surface with mild associated pleural thickening and fluid. Given history, the large left pleural effusion is likely malignant. 2. Adenopathy in the mediastinum worrisome for metastatic disease. A PET-CT could better evaluate. 3. 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 4. Mildly prominent left adrenal gland, incompletely evaluated. Recommend attention on PET-CT. 5. Coronary artery calcifications. 6. Atelectasis in  the bases. 7. Emphysematous changes in the lungs. Aortic Atherosclerosis (ICD10-I70.0) and Emphysema (ICD10-J43.9). Electronically Signed   By: Dorise Bullion III M.D   On: 10/30/2019 10:57   DG Chest Port 1 View  Result Date: 10/29/2019 CLINICAL DATA:  74 year old male status post thoracentesis. EXAM: PORTABLE CHEST 1 VIEW COMPARISON:  Earlier radiograph dated 10/29/2019. FINDINGS: Interval decrease in the size of the left pleural effusion with moderate residual effusion. There is aerated portion of the left upper lobe. The right lung is clear. No pneumothorax. Stable cardiomediastinal silhouette. No acute osseous pathology. IMPRESSION: Interval decrease in the size of the left pleural effusion with moderate residual effusion. No pneumothorax. Electronically Signed   By: Anner Crete M.D.   On: 10/29/2019 23:34   DG Chest Port 1 View  Result Date: 10/29/2019 CLINICAL DATA:  Dyspnea. Abdominal pain and vomiting while at dialysis today, unable to get complete treatment. EXAM: PORTABLE CHEST 1 VIEW COMPARISON:  Radiograph 10/22/2019 FINDINGS: Progressive left pleural effusion with near  complete opacification of the left hemithorax, small portion of left apical lung is aerated. There is rightward mediastinal shift suggesting large fluid volume rather than compressive atelectasis. Mediastinal contours are obscured by left lung opacity. Probable right perihilar atelectasis. No significant right pleural effusion. No pneumothorax. IMPRESSION: Progressive left pleural effusion over the past week with near complete opacification of the left hemithorax. Rightward mediastinal shift suggesting large fluid volume volume rather than passive atelectasis. Electronically Signed   By: Keith Rake M.D.   On: 10/29/2019 20:45   US THORACENTESIS ASP PLEURAL SPACE W/IMG GUIDE  Result Date: 10/29/2019 INDICATION: 74 year old with shortness of breath and large left pleural effusion. EXAM: ULTRASOUND GUIDED LEFT THORACENTESIS MEDICATIONS: None. COMPLICATIONS: None immediate. PROCEDURE: An ultrasound guided thoracentesis was thoroughly discussed with the patient and questions answered. The benefits, risks, alternatives and complications were also discussed. The patient understands and wishes to proceed with the procedure. Written consent was obtained. Ultrasound was performed to localize and mark an adequate pocket of fluid in the left chest. The area was then prepped and draped in the normal sterile fashion. 1% Lidocaine was used for local anesthesia. Under ultrasound guidance a 6 Fr Safe-T-Centesis catheter was introduced. Thoracentesis was performed. The catheter was removed and a dressing applied. FINDINGS: A total of approximately 2.35 L of bloody fluid was removed. Samples were sent to the laboratory as requested by the clinical team. IMPRESSION: Successful ultrasound guided left thoracentesis yielding 2.35 L of pleural fluid. Electronically Signed   By: Markus Daft M.D.   On: 10/29/2019 23:26        Scheduled Meds: . heparin  5,000 Units Subcutaneous Q8H  . insulin aspart  0-5 Units Subcutaneous  QHS  . insulin aspart  0-6 Units Subcutaneous TID WC  . nicotine  21 mg Transdermal Daily   Continuous Infusions:   LOS: 2 days    Time spent:40 min    Natasia Sanko, Geraldo Docker, MD [facility] Pager [phone]  If 7PM-7AM, please contact night-coverage [email or web address] Password [facility] [address] 10/31/2019, 8:42 AM

## 2019-10-31 NOTE — Consult Note (Signed)
Pulmonary Medicine          Date: 10/31/2019,   MRN# [id number] Jose [name] November 14, 1945     AdmissionWeight: 8 kg                 CurrentWeight: 55.4 kg   Referring physician: Dr. Sherral Hammers   CHIEF COMPLAINT:   Large left pleural effusion with hilar/mediastinal lymphadenopathy concern for lung cancer   HISTORY OF PRESENT ILLNESS   This is a pleasant 74 year old male with a history of chronic kidney disease, diabetes, gout, dyslipidemia, prostate cancer, history of CVA, came in due to worsening shortness of breath and was found to have large left pleural effusion.  Subsequently has had thoracentesis with improvement of his respiratory status, currently requiring 2 L nasal cannula for supplemental oxygen.  Patient is a lifelong smoker.  Pleural fluid studies including cytology has not resulted yet and is in progress.  Pulmonary consultation placed for additional evaluation and management of possible underlying pulmonary malignancy.   PAST MEDICAL HISTORY   Past Medical History:  Diagnosis Date  . Chronic kidney disease   . Depression   . Diabetes mellitus without complication ([place])   . Glaucoma   . Glaucoma   . Gout   . Heart murmur   . HLD (hyperlipidemia)   . Hypertension   . Prostate cancer ([place])   . Renal failure    dialysis t/t/s  . Stroke ([place])    tia x 2     SURGICAL HISTORY   Past Surgical History:  Procedure Laterality Date  . AMPUTATION TOE Bilateral 07/23/2018   Procedure: TOE MPJ RIGHT 3RD AND LEFT 2ND;  Surgeon: Samara Deist, DPM;  Location: [facility];  Service: Podiatry;  Laterality: Bilateral;  . APPENDECTOMY    . AV FISTULA PLACEMENT Left 06/09/2018   Procedure: ARTERIOVENOUS (AV) FISTULA CREATION ( BRACHIAL CEPHALIC);  Surgeon: Algernon Huxley, MD;  Location: [facility];  Service: Vascular;  Laterality: Left;  . CHOLECYSTECTOMY    . DIALYSIS/PERMA CATHETER INSERTION N/A 03/24/2018   Procedure: DIALYSIS/PERMA CATHETER INSERTION;  Surgeon:  Katha Cabal, MD;  Location: [place] [facility];  Service: Cardiovascular;  Laterality: N/A;  . DIALYSIS/PERMA CATHETER REMOVAL N/A 09/06/2018   Procedure: DIALYSIS/PERMA CATHETER REMOVAL;  Surgeon: Algernon Huxley, MD;  Location: [place] [facility];  Service: Cardiovascular;  Laterality: N/A;  . INSERTION PROSTATE RADIATION SEED    . IRRIGATION AND DEBRIDEMENT FOOT Right 12/17/2018   Procedure: IRRIGATION AND DEBRIDEMENT FOOT;  Surgeon: Samara Deist, DPM;  Location: [facility];  Service: Podiatry;  Laterality: Right;  . LOWER EXTREMITY ANGIOGRAPHY Right 06/16/2018   Procedure: LOWER EXTREMITY ANGIOGRAPHY;  Surgeon: Algernon Huxley, MD;  Location: [place] [facility];  Service: Cardiovascular;  Laterality: Right;  . LOWER EXTREMITY ANGIOGRAPHY Left 08/30/2018   Procedure: LOWER EXTREMITY ANGIOGRAPHY;  Surgeon: Algernon Huxley, MD;  Location: [place] [facility];  Service: Cardiovascular;  Laterality: Left;  . LOWER EXTREMITY ANGIOGRAPHY Right 09/30/2018   Procedure: LOWER EXTREMITY ANGIOGRAPHY;  Surgeon: Algernon Huxley, MD;  Location: [place] [facility];  Service: Cardiovascular;  Laterality: Right;  . LOWER EXTREMITY ANGIOGRAPHY Left 10/07/2018   Procedure: LOWER EXTREMITY ANGIOGRAPHY;  Surgeon: Algernon Huxley, MD;  Location: [place] [facility];  Service: Cardiovascular;  Laterality: Left;  . LOWER EXTREMITY ANGIOGRAPHY Right 12/20/2018   Procedure: Lower Extremity Angiography;  Surgeon: Algernon Huxley, MD;  Location: [place] [facility];  Service: Cardiovascular;  Laterality: Right;  .  TRANSMETATARSAL AMPUTATION Bilateral 10/13/2018   Procedure: 1.  Amputation right great toe MTPJ 2.  Amputation right second toe MTPJ 3.  Amputation right fourth toe MTPJ 4.  Amputation right fifth toe MTPJ 5.  Excision distal second metatarsal head right foot 6.  Excision distal third metatarsal head right foot 7.  Amputation left third toe 8.  Incision and drainage infectionleft 2nd toe amputation site. ;  Surgeon:  Samara Deist, DPM;  Location     FAMILY HISTORY   Family History  Problem Relation Age of Onset  . Varicose Veins Neg Hx      SOCIAL HISTORY   Social History   Tobacco Use  . Smoking status: Current Some Day Smoker    Packs/day: 0.10    Types: Cigarettes  . Smokeless tobacco: Never Used  Substance Use Topics  . Alcohol use: Not Currently    Alcohol/week: 0.0 standard drinks  . Drug use: Never     MEDICATIONS    Home Medication:    Current Medication:  Current Facility-Administered Medications:  .  heparin injection 5,000 Units, 5,000 Units, Subcutaneous, Q8H, Elwyn Reach, MD, 5,000 Units at 10/31/19 0749 .  insulin aspart (novoLOG) injection 0-5 Units, 0-5 Units, Subcutaneous, QHS, Garba, Mohammad L, MD .  insulin aspart (novoLOG) injection 0-6 Units, 0-6 Units, Subcutaneous, TID WC, Garba, Mohammad L, MD .  nicotine (NICODERM CQ - dosed in mg/24 hours) patch 21 mg, 21 mg, Transdermal, Daily, Allie Bossier, MD, 21 mg at 10/31/19 0855 .  ondansetron (ZOFRAN) tablet 4 mg, 4 mg, Oral, Q6H PRN **OR** ondansetron (ZOFRAN) injection 4 mg, 4 mg, Intravenous, Q6H PRN, Jonelle Sidle, Mohammad L, MD    ALLERGIES   Penicillin g     REVIEW OF SYSTEMS    Review of Systems:  Gen:  Denies  fever, sweats, chills weigh loss  HEENT: Denies blurred vision, double vision, ear pain, eye pain, hearing loss, nose bleeds, sore throat Cardiac:  No dizziness, chest pain or heaviness, chest tightness,edema Resp:   Denies cough or sputum porduction, shortness of breath,wheezing, hemoptysis,  Gi: Denies swallowing difficulty, stomach pain, nausea or vomiting, diarrhea, constipation, bowel incontinence Gu:  Denies bladder incontinence, burning urine Ext:   Denies Joint pain, stiffness or swelling Skin: Denies  skin rash, easy bruising or bleeding or hives Endoc:  Denies polyuria, polydipsia , polyphagia or weight change Psych:   Denies depression, insomnia or hallucinations    Other:  All other systems negative   VS: BP (!) 145/78 (BP Location: Left Arm)   Pulse 70   Temp (!) 97.5 F (36.4 C) (Oral)   Resp 15   Ht 6\' 1"  (1.854 m)   Wt 55.4 kg   SpO2 100%   BMI 16.11 kg/m      PHYSICAL EXAM    GENERAL:NAD, no fevers, chills, no weakness no fatigue HEAD: Normocephalic, atraumatic.  EYES: Pupils equal, round, reactive to light. Extraocular muscles intact. No scleral icterus.  MOUTH: Moist mucosal membrane. Dentition intact. No abscess noted.  EAR, NOSE, THROAT: Clear without exudates. No external lesions.  NECK: Supple. No thyromegaly. No nodules. No JVD.  PULMONARY: Mild rhonchi bilaterally with decreased breath sounds at the left base CARDIOVASCULAR: S1 and S2. Regular rate and rhythm. No murmurs, rubs, or gallops. No edema. Pedal pulses 2+ bilaterally.  GASTROINTESTINAL: Soft, nontender, nondistended. No masses. Positive bowel sounds. No hepatosplenomegaly.  MUSCULOSKELETAL: No swelling, clubbing, or edema. Range of motion full in all extremities.  NEUROLOGIC: Cranial nerves II through  XII are intact. No gross focal neurological deficits. Sensation intact. Reflexes intact.  SKIN: No ulceration, lesions, rashes, or cyanosis. Skin warm and dry. Turgor intact.  PSYCHIATRIC: Mood, affect within normal limits. The patient is awake, alert and oriented x 3. Insight, judgment intact.       IMAGING    CT ABDOMEN PELVIS WO CONTRAST  Result Date: 10/30/2019 CLINICAL DATA:  73 year old male with history of prostate cancer. Left upper lobe mass seen on earlier CT. EXAM: CT ABDOMEN AND PELVIS WITHOUT CONTRAST TECHNIQUE: Multidetector CT imaging of the abdomen and pelvis was performed following the standard protocol without IV contrast. COMPARISON:  Chest CT dated 10/30/2019. FINDINGS: Evaluation of this exam is limited in the absence of intravenous contrast. Lower chest: Please see report for chest CT of 10/30/2019. No intra-abdominal free air or free fluid.  Hepatobiliary: The liver is grossly unremarkable. No intrahepatic biliary ductal dilatation. There is irregular and masslike thickening of the gallbladder wall concerning for primary neoplasm or metastatic disease. Further initial evaluation with ultrasound recommended. MRI may provide better evaluation depending on the ultrasound findings. No calcified gallstone or pericholecystic fluid. Pancreas: Unremarkable. No pancreatic ductal dilatation or surrounding inflammatory changes. Spleen: Normal in size without focal abnormality. Adrenals/Urinary Tract: Indeterminate bilateral adrenal thickening or hyperplasia. Moderate bilateral renal parenchyma atrophy. There is a 15 mm left renal hypodense lesion which demonstrates fluid attenuation, likely a cyst. Additional smaller hypodense lesions are too small to characterize. There is no hydronephrosis on either side. The visualized ureters appear unremarkable. Excreted contrast from recent CT is noted within the bladder. Stomach/Bowel: There is extensive sigmoid diverticulosis and scattered colonic diverticula without active inflammatory changes. There is no bowel obstruction or active inflammation. The appendix is normal. Vascular/Lymphatic: Advanced aortoiliac atherosclerotic disease. Diffuse ectasia of the abdominal aorta with borderline aneurysmal dilatation of the infrarenal abdominal aorta measuring up to 3 cm in diameter. The IVC is unremarkable. Bilateral common iliac artery stents noted. No portal venous gas. There is no adenopathy. Reproductive: Prostate brachytherapy seeds. Other: Paucity of subcutaneous fat. Musculoskeletal: Degenerative changes of the spine with findings of renal osteodystrophy. No acute osseous pathology. Mild bilateral femoral head avascular necrosis. IMPRESSION: 1. Irregular and masslike thickening of the gallbladder wall concerning for primary neoplasm or metastatic disease. Further initial evaluation with ultrasound recommended. 2.  Extensive colonic diverticulosis. No bowel obstruction. Normal appendix. 3. Aortic Atherosclerosis (ICD10-I70.0). 4. A 3 cm infrarenal abdominal aortic aneurysm. Recommend followup by ultrasound in 3 years. This recommendation follows ACR consensus guidelines: White Paper of the ACR Incidental Findings Committee II on Vascular Findings. J Am Coll Radiol 2013; 10:789-794. Aortic aneurysm NOS (ICD10-I71.9) Electronically Signed   By: Anner Crete M.D.   On: 10/30/2019 19:40   CT CHEST W CONTRAST  Result Date: 10/30/2019 CLINICAL DATA:  Left pleural effusion. Thoracentesis yesterday demonstrated bloody fluid. Malignancy suspected. EXAM: CT CHEST WITH CONTRAST TECHNIQUE: Multidetector CT imaging of the chest was performed during intravenous contrast administration. CONTRAST:  45mL OMNIPAQUE IOHEXOL 300 MG/ML  SOLN COMPARISON:  Recent chest x-rays FINDINGS: Cardiovascular: Atherosclerotic changes are seen in the thoracic aorta. The ascending thoracic aorta measures up to 4.1 cm, mildly aneurysmal. The central pulmonary arteries are normal. Coronary artery calcifications involving the right and left coronary arteries. The heart is unremarkable. Mediastinum/Nodes: There is a mildly prominent node in the base of the neck on the left measuring 9 mm in short axis on series 2, image 14, asymmetric to the right. There is adenopathy with a node measuring  13 mm in short axis in the prevascular space. There is a prominent node in the AP window measuring 15 mm on series 2, image 56. No subcarinal adenopathy. No right-sided adenopathy identified. There is a moderate to large left effusion and a tiny right effusion. No pericardial effusion identified. The thyroid is unremarkable. The esophagus is normal. Lungs/Pleura: There is a spiculated mass in the left upper lobe with central cavitation. Platelike opacity extends off the inferior aspect of the mass, likely associated atelectasis. The mass extends to the pleural surface  with some adjacent pleural fluid and pleural thickening. The mass appears to measure 3.2 by 1.7 by 1.6 cm in craniocaudal, transverse, and AP dimensions. There is a moderate left-sided pleural effusion with underlying opacity, likely atelectasis. No centrally obstructing mass is identified on the left. There is a small right effusion with associated atelectasis. Emphysematous changes are seen in the lungs. Upper Abdomen: Visualized liver and gallbladder are normal. The right kidney is relatively atrophic. The left kidney is not visualized. The left adrenal gland is mildly prominent as seen on series 2, image 162 but not fully evaluated. No other abnormalities in the upper abdomen. Musculoskeletal: No chest wall abnormality. No acute or significant osseous findings. IMPRESSION: 1. Spiculated cavitated mass in the left upper lobe is above highly suspicious for malignancy. Linear opacity extending off the inferior mass is likely atelectasis. The mass extends to the left pleural surface with mild associated pleural thickening and fluid. Given history, the large left pleural effusion is likely malignant. 2. Adenopathy in the mediastinum worrisome for metastatic disease. A PET-CT could better evaluate. 3. 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 4. Mildly prominent left adrenal gland, incompletely evaluated. Recommend attention on PET-CT. 5. Coronary artery calcifications. 6. Atelectasis in the bases. 7. Emphysematous changes in the lungs. Aortic Atherosclerosis (ICD10-I70.0) and Emphysema (ICD10-J43.9). Electronically Signed   By: Dorise Bullion III M.D   On: 10/30/2019 10:57   US Pelvis Limited  Result Date: 10/22/2019 CLINICAL DATA:  74 year old male with a history of urinary frequency EXAM: LIMITED ULTRASOUND OF PELVIS TECHNIQUE: Limited transabdominal ultrasound examination of the pelvis was performed. COMPARISON:  None. FINDINGS: Urinary bladder measures 440 cc prevoid, 403  cc post void. Prostate volume estimated 15 cc. IMPRESSION: Significant post void residual indicating urinary retention. Prostate volume estimated 15 cc. Electronically Signed   By: Corrie Mckusick D.O.   On: 10/22/2019 12:24   DG Chest Port 1 View  Result Date: 10/29/2019 CLINICAL DATA:  74 year old male status post thoracentesis. EXAM: PORTABLE CHEST 1 VIEW COMPARISON:  Earlier radiograph dated 10/29/2019. FINDINGS: Interval decrease in the size of the left pleural effusion with moderate residual effusion. There is aerated portion of the left upper lobe. The right lung is clear. No pneumothorax. Stable cardiomediastinal silhouette. No acute osseous pathology. IMPRESSION: Interval decrease in the size of the left pleural effusion with moderate residual effusion. No pneumothorax. Electronically Signed   By: Anner Crete M.D.   On: 10/29/2019 23:34   DG Chest Port 1 View  Result Date: 10/29/2019 CLINICAL DATA:  Dyspnea. Abdominal pain and vomiting while at dialysis today, unable to get complete treatment. EXAM: PORTABLE CHEST 1 VIEW COMPARISON:  Radiograph 10/22/2019 FINDINGS: Progressive left pleural effusion with near complete opacification of the left hemithorax, small portion of left apical lung is aerated. There is rightward mediastinal shift suggesting large fluid volume rather than compressive atelectasis. Mediastinal contours are obscured by left lung opacity. Probable right perihilar atelectasis.  No significant right pleural effusion. No pneumothorax. IMPRESSION: Progressive left pleural effusion over the past week with near complete opacification of the left hemithorax. Rightward mediastinal shift suggesting large fluid volume volume rather than passive atelectasis. Electronically Signed   By: Keith Rake M.D.   On: 10/29/2019 20:45   DG Chest Portable 1 View  Result Date: 10/22/2019 CLINICAL DATA:  Productive cough for 4 days with congestion and watery eyes. Current smoker. History of  diabetes. EXAM: PORTABLE CHEST 1 VIEW COMPARISON:  Chest radiographs 06/12/2018 and 03/21/2018. FINDINGS: 1141 hours. Dialysis catheter has been removed in the interval. The visualized heart size and mediastinal contours are stable with mild aortic atherosclerosis. There is a new moderate-sized left pleural effusion with associated left basilar pulmonary opacity. No significant right pleural effusion. No pneumothorax. The pulmonary vasculature is slightly indistinct, and there may be mild pulmonary edema. No significant osseous findings. IMPRESSION: New moderate-sized left pleural effusion with associated left basilar atelectasis or infiltrate. Possible mild pulmonary edema. Electronically Signed   By: Richardean Sale M.D.   On: 10/22/2019 12:18   US THORACENTESIS ASP PLEURAL SPACE W/IMG GUIDE  Result Date: 10/29/2019 INDICATION: 74 year old with shortness of breath and large left pleural effusion. EXAM: ULTRASOUND GUIDED LEFT THORACENTESIS MEDICATIONS: None. COMPLICATIONS: None immediate. PROCEDURE: An ultrasound guided thoracentesis was thoroughly discussed with the patient and questions answered. The benefits, risks, alternatives and complications were also discussed. The patient understands and wishes to proceed with the procedure. Written consent was obtained. Ultrasound was performed to localize and mark an adequate pocket of fluid in the left chest. The area was then prepped and draped in the normal sterile fashion. 1% Lidocaine was used for local anesthesia. Under ultrasound guidance a 6 Fr Safe-T-Centesis catheter was introduced. Thoracentesis was performed. The catheter was removed and a dressing applied. FINDINGS: A total of approximately 2.35 L of bloody fluid was removed. Samples were sent to the laboratory as requested by the clinical team. IMPRESSION: Successful ultrasound guided left thoracentesis yielding 2.35 L of pleural fluid. Electronically Signed   By: Markus Daft M.D.   On: 10/29/2019 23:26       ASSESSMENT/PLAN   Large left pleural effusion with hilar and mediastinal lymphadenopathy as well as left apical spiculated lesion    -Constellation of findings suggestive of possible pulmonary malignancy    -Patient reports chills mild diaphoresis as well as recent weight loss concerning for constitutional symptoms    -High pretest probability for malignancy due to lifelong smoking from age 78 to current active smoking status approximately 1/2 pack daily-for total over 70 pack years smoking history   -Status post thoracentesis-cytology is pending   -Discussed endobronchial ultrasound assisted lymph node biopsies-patient is agreeable   -Currently patient's respiratory status significantly improved post thoracentesis and he may need only outpatient work-up.   -Patient is on 2 L nasal cannula supplemental oxygen   -We will continue to follow along with you    Thank you for allowing me to participate in the care of this patient.   Patient/Family are satisfied with care plan and all questions have been answered.   This document was prepared using Dragon voice recognition software and may include unintentional dictation errors.     Ottie Glazier, M.D.  Division of [facility]

## 2019-11-01 ENCOUNTER — Inpatient Hospital Stay: Payer: Medicare Other

## 2019-11-01 ENCOUNTER — Encounter: Payer: Self-pay | Admitting: Internal Medicine

## 2019-11-01 LAB — CBC WITH DIFFERENTIAL/PLATELET
Abs Immature Granulocytes: 0.06 10*3/uL (ref 0.00–0.07)
Basophils Absolute: 0.1 10*3/uL (ref 0.0–0.1)
Basophils Relative: 1 %
Eosinophils Absolute: 0.3 10*3/uL (ref 0.0–0.5)
Eosinophils Relative: 4 %
HCT: 33.4 % — ABNORMAL LOW (ref 39.0–52.0)
Hemoglobin: 11 g/dL — ABNORMAL LOW (ref 13.0–17.0)
Immature Granulocytes: 1 %
Lymphocytes Relative: 20 %
Lymphs Abs: 1.8 10*3/uL (ref 0.7–4.0)
MCH: 32.6 pg (ref 26.0–34.0)
MCHC: 32.9 g/dL (ref 30.0–36.0)
MCV: 99.1 fL (ref 80.0–100.0)
Monocytes Absolute: 0.9 10*3/uL (ref 0.1–1.0)
Monocytes Relative: 10 %
Neutro Abs: 6.1 10*3/uL (ref 1.7–7.7)
Neutrophils Relative %: 64 %
Platelets: 259 10*3/uL (ref 150–400)
RBC: 3.37 MIL/uL — ABNORMAL LOW (ref 4.22–5.81)
RDW: 13.8 % (ref 11.5–15.5)
WBC: 9.3 10*3/uL (ref 4.0–10.5)
nRBC: 0 % (ref 0.0–0.2)

## 2019-11-01 LAB — COMPREHENSIVE METABOLIC PANEL
ALT: 11 U/L (ref 0–44)
AST: 11 U/L — ABNORMAL LOW (ref 15–41)
Albumin: 2.6 g/dL — ABNORMAL LOW (ref 3.5–5.0)
Alkaline Phosphatase: 40 U/L (ref 38–126)
Anion gap: 13 (ref 5–15)
BUN: 82 mg/dL — ABNORMAL HIGH (ref 8–23)
CO2: 25 mmol/L (ref 22–32)
Calcium: 7.8 mg/dL — ABNORMAL LOW (ref 8.9–10.3)
Chloride: 95 mmol/L — ABNORMAL LOW (ref 98–111)
Creatinine, Ser: 8.64 mg/dL — ABNORMAL HIGH (ref 0.61–1.24)
GFR calc Af Amer: 6 mL/min — ABNORMAL LOW (ref >60)
GFR calc non Af Amer: 5 mL/min — ABNORMAL LOW (ref >60)
Glucose, Bld: 71 mg/dL (ref 70–99)
Potassium: 4.9 mmol/L (ref 3.5–5.1)
Sodium: 133 mmol/L — ABNORMAL LOW (ref 135–145)
Total Bilirubin: 0.7 mg/dL (ref 0.3–1.2)
Total Protein: 5.9 g/dL — ABNORMAL LOW (ref 6.5–8.1)

## 2019-11-01 LAB — MAGNESIUM: Magnesium: 2.1 mg/dL (ref 1.7–2.4)

## 2019-11-01 LAB — GLUCOSE, CAPILLARY
Glucose-Capillary: 64 mg/dL — ABNORMAL LOW (ref 70–99)
Glucose-Capillary: 89 mg/dL (ref 70–99)
Glucose-Capillary: 111 mg/dL — ABNORMAL HIGH (ref 70–99)
Glucose-Capillary: 113 mg/dL — ABNORMAL HIGH (ref 70–99)

## 2019-11-01 LAB — HEPATITIS B SURFACE ANTIGEN: Hepatitis B Surface Ag: NONREACTIVE

## 2019-11-01 LAB — PHOSPHORUS: Phosphorus: 5.8 mg/dL — ABNORMAL HIGH (ref 2.5–4.6)

## 2019-11-01 MED ORDER — HEPARIN SODIUM (PORCINE) 5000 UNIT/ML IJ SOLN
5000.0000 [IU] | Freq: Three times a day (TID) | INTRAMUSCULAR | Status: AC
  Administered 2019-11-02 – 2019-11-03 (×2): 5000 [IU] via SUBCUTANEOUS
  Filled 2019-11-01 (×4): qty 1

## 2019-11-01 NOTE — Care Management Important Message (Signed)
Important Message  Patient Details  Name: Jose [name] MRN: [id number] Date of Birth: 1946/04/18   Medicare Important Message Given:  Yes     [name] 11/01/2019, 11:27 AM

## 2019-11-01 NOTE — Progress Notes (Signed)
PROGRESS NOTE    Jose [name]  QAS:[id number] DOB: [birth year] DOA: 10/29/2019 PCP: [name], MD     Brief Narrative:  Jose [name] is a 73 y.o.BM PMHx Depression, CVA, ESRD on HD TTS, DM type II controlled with complication, Chronic Diastolic CHF HTN, Prostate cancer , Glaucoma and gout, HLD, tobacco abuse ("1/4bpd forever),  who came to the ER with significant shortness of breath.  Patient's son gave the history that he was at dialysis and started having abdominal pain and then had vomiting.  He did not complete his treatment then.  They took him off early and was brought to the ER.  He was in the hospital recently and discharged on Thursday.  During the hospitalization he was also having significant shortness of breath and cough.  He was treated for productive cough and shortness of breath.  During that time he did have some watery diarrhea.  No fever or chills.  He was noted to have turbid urine suspected to have UTI.  He was treated for pulmonary edema and acute on chronic diastolic heart failure which resolved.  Today however patient was noted to have large left left-sided pleural effusion suspected to be primarily responsible for his symptoms.  He had thoracentesis performed tonight with about 2 L of fluid removed.  He is relatively improved but still short of breath.  Patient being admitted for further treatment in the hospital..  ED Course: Temperature 97.8 blood pressure 130/72 pulse 89 respiratory of 21 oxygen sat 83% on room air currently 95% on 2 L.  White count is 15,000 hemoglobin 12.8 and platelets 279 sodium 139 potassium 3.3 chloride 98.  His CO2 is 24 BUN 59 creatinine 6.95 and calcium 8.5.  Glucose is 199 INR 1.1.  Initial chest x-ray showed progressive left pleural effusion worsening since last week with complete opacification of the left hemithorax.  Subsequent ultrasound-guided thoracentesis performed with about 2 L of fluid removed.   Subjective: 6/1 afebrile last  24 hours.  A/O x4, negative CP, negative S OB, negative abdominal pain.  Cachectic    Assessment & Plan:   Principal Problem:   Pleural effusion on left Active Problems:   Type II diabetes mellitus with renal manifestations (HCC)   Hypertension   ESRD on dialysis (HCC)   Tobacco abuse   Acute on chronic diastolic CHF (congestive heart failure) (HCC)   SOB (shortness of breath)   Hypokalemia   Chronic diastolic CHF (congestive heart failure) (HCC)   Benign essential HTN   Malignant neoplasm of unknown origin ([place])   Acute respiratory failure with hypoxia (HCC)   End-stage renal disease on hemodialysis ([place])   Diabetes mellitus type 2, controlled, with complications (HCC)  Chronic diastolic CHF -Strict in and out +1.4 L -Daily weight Filed Weights   10/29/19 1349 10/31/19 0643 11/01/19 0642  Weight: 54 kg 55.4 kg 59 kg  -Currently patient's BP controlled without medication monitor closely  Essential HTN -See CHF  Metastatic lung cancer vs primary lung cancer -Patient is primary versus metastatic cancer in his lung. -CT abdomen pelvis; masslike thickening of gallbladder see results below.   Recurrent LEFT-sided pleural effusion: So far analysis showed transudate.  Awaiting full chemistry as well as Gram stain and culture.  Suspected all due to anasarca from renal failure.  Monitor closely -5/29 LEFT thoracentesis aspirated 2.3 L bloody fluid. -5/30 CT chest pending -5/30 spoke with Levada Dy in [place] [facility] stateed fluid was sent off for pathology/cytology to [facility] earliest we  would hear back any results is Tuesday. -5/31 discussed case with Dr Ottie Glazier PCCM who concurs that patient most likely has cancer.  Would like to defer EBUS until results of thoracentesis return. -6/1 PCXR pending  -6/1 cytology/pathology report has not returned from thoracentesis conducted on 5/29 -6/1 have requested IR drainage of recurrent LEFT pleural effusion with placement of Pleurx cath.   Explained procedure to patient and he agreed to placement. -We will hold subcu heparin on 6/2 @0  800  Acute respiratory failure with hypoxia -Per patient has had multiple episodes of pleural effusions requiring draining, never worked up. -See recurrent pleural effusion   ESRD on HD TTS, -Per EMR partial session of HD on Thursday.    DM type II controlled with complication -8/11 hemoglobin A1c= 5.6 -Ultrasensitive SSI  Goals of care 5/31 palliative care consult; patient with multisystem organ failure, now with neoplasm evaluate patient for change of CODE STATUS, advanced directives, hospice.    DVT prophylaxis: Subcu heparin Code Status: Full Family Communication:  Disposition Plan:  Status is: Inpatient    Dispo: The patient is from: Home              Anticipated d/c is to: Home              Anticipated d/c date is: 7 June              Patient currently unstable      Consultants:  PCCM Nephrology IR   Procedures/Significant Events:  5/29 LEFT thoracentesis; 2.3 L bloody fluid aspirated 5/29 HD 5/30 CT chest W contrast;-spiculated cavitated mass in the left upper lobe is above highly suspicious for malignancy.  -Linear opacity extending off the inferior mass is likely atelectasis.  -The mass extends to the left pleural surface with mild associated pleural thickening and fluid. Given history, the large left pleural effusion is likely malignant. -Adenopathy in the mediastinum worrisome for metastatic disease. A PET-CT could better evaluate. 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 -Mildly prominent left adrenal gland, incompletely evaluated. Recommend attention on PET-CT. -Atelectasis in the bases. -Emphysematous changes in the lungs. 5/31 CT abdomen pelvis W0 contrast;-irregular and masslike thickening of the gallbladder wall concerning for primary neoplasm or metastatic disease. Further initial evaluation with ultrasound  recommended. -Extensive colonic diverticulosis. No bowel obstruction. Norma appendix. -3 cm infrarenal abdominal aortic aneurysm. Recommend followup by ultrasound in 3 years.  6/1 PCXR;-rounded airspace opacities/nodule seen within the left upper lung as on recent CT. -Large left pleural effusion/consolidation    I have personally reviewed and interpreted all radiology studies and my findings are as above.  VENTILATOR SETTINGS: Nasal cannula 6/1 Flow 2 L/min SPO2 99%   Cultures 5/29 LEFT pleural fluid NGTD    Antimicrobials: Anti-infectives (From admission, onward)   None       Devices    LINES / TUBES:      Continuous Infusions:   Objective: Vitals:   11/01/19 0900 11/01/19 0915 11/01/19 0930 11/01/19 0945  BP: (!) 149/70 137/63 (!) 95/58 (!) 141/69  Pulse: 61 66 74 72  Resp: 15 14 17 18   Temp:      TempSrc:      SpO2: 98% 100% 94% 97%  Weight:      Height:        Intake/Output Summary (Last 24 hours) at 11/01/2019 1126 Last data filed at 10/31/2019 1411 Gross per 24 hour  Intake 120 ml  Output --  Net 120 ml  Filed Weights   10/29/19 1349 10/31/19 0643 11/01/19 0642  Weight: 54 kg 55.4 kg 59 kg   Physical Exam:  General: A/O x4, no acute respiratory distress, cachectic Eyes: negative scleral hemorrhage, negative anisocoria, negative icterus ENT: Negative Runny nose, negative gingival bleeding, Neck:  Negative scars, masses, torticollis, lymphadenopathy, JVD Lungs: Absent breath sounds lingula, left lower lobe, CTA right lung lobes  without wheezes or crackles Cardiovascular: Regular rate and rhythm without murmur gallop or rub normal S1 and S2 Abdomen: negative abdominal pain, nondistended, positive soft, bowel sounds, no rebound, no ascites, no appreciable mass Extremities: No significant cyanosis, clubbing, or edema bilateral lower extremities Skin: Negative rashes, lesions, ulcers Psychiatric:  Negative depression, negative anxiety,  negative fatigue, negative mania  Central nervous system:  Cranial nerves II through XII intact, tongue/uvula midline, all extremities muscle strength 5/5, sensation intact throughout, negative expressive aphasia, negative receptive aphasia.  .     Data Reviewed: Care during the described time interval was provided by me .  I have reviewed this patient's available data, including medical history, events of note, physical examination, and all test results as part of my evaluation.  CBC: Recent Labs  Lab 10/29/19 1403 10/30/19 0207 10/31/19 0332 11/01/19 0405  WBC 15.0* 12.8* 9.7 9.3  NEUTROABS  --   --  6.8 6.1  HGB 12.8* 12.2* 11.3* 11.0*  HCT 37.2* 36.7* 34.5* 33.4*  MCV 95.6 100.3* 100.9* 99.1  PLT 279 249 245 627   Basic Metabolic Panel: Recent Labs  Lab 10/29/19 1403 10/30/19 0207 10/31/19 0332 11/01/19 0405  NA 139 142 136 133*  K 3.3* 4.3 4.6 4.9  CL 98 101 97* 95*  CO2 24 29 26 25   GLUCOSE 199* 95 64* 71  BUN 59* 68* 74* 82*  CREATININE 6.95* 8.26* 8.49* 8.64*  CALCIUM 8.5* 7.2* 7.5* 7.8*  MG  --   --  2.1 2.1  PHOS  --   --  5.8* 5.8*   GFR: Estimated Creatinine Clearance: 6.4 mL/min (A) (by C-G formula based on SCr of 8.64 mg/dL (H)). Liver Function Tests: Recent Labs  Lab 10/29/19 1403 10/31/19 0332 11/01/19 0405  AST 17 14* 11*  ALT 11 9 11   ALKPHOS 49 43 40  BILITOT 1.0 0.7 0.7  PROT 7.2 6.2* 5.9*  ALBUMIN 3.3* 2.7* 2.6*   Recent Labs  Lab 10/29/19 1403  LIPASE 41   No results for input(s): AMMONIA in the last 168 hours. Coagulation Profile: Recent Labs  Lab 10/29/19 2217  INR 1.1   Cardiac Enzymes: No results for input(s): CKTOTAL, CKMB, CKMBINDEX, TROPONINI in the last 168 hours. BNP (last 3 results) No results for input(s): PROBNP in the last 8760 hours. HbA1C: Recent Labs    10/30/19 0207  HGBA1C 5.6   CBG: Recent Labs  Lab 10/31/19 0758 10/31/19 1153 10/31/19 1634 10/31/19 2107 11/01/19 0756  GLUCAP 93 119* 80 81  64*   Lipid Profile: No results for input(s): CHOL, HDL, LDLCALC, TRIG, CHOLHDL, LDLDIRECT in the last 72 hours. Thyroid Function Tests: No results for input(s): TSH, T4TOTAL, FREET4, T3FREE, THYROIDAB in the last 72 hours. Anemia Panel: No results for input(s): VITAMINB12, FOLATE, FERRITIN, TIBC, IRON, RETICCTPCT in the last 72 hours. Sepsis Labs: No results for input(s): PROCALCITON, LATICACIDVEN in the last 168 hours.  Recent Results (from the past 240 hour(s))  SARS Coronavirus 2 by RT PCR (hospital order, performed in [facility] hospital lab) Nasopharyngeal Nasopharyngeal Swab     Status: None  Collection Time: 10/22/19 12:55 PM   Specimen: Nasopharyngeal Swab  Result Value Ref Range Status   SARS Coronavirus 2 NEGATIVE NEGATIVE Final    Comment: (NOTE) SARS-CoV-2 target nucleic acids are NOT DETECTED. The SARS-CoV-2 RNA is generally detectable in upper and lower respiratory specimens during the acute phase of infection. The lowest concentration of SARS-CoV-2 viral copies this assay can detect is 250 copies / mL. A negative result does not preclude SARS-CoV-2 infection and should not be used as the sole basis for treatment or other patient management decisions.  A negative result may occur with improper specimen collection / handling, submission of specimen other than nasopharyngeal swab, presence of viral mutation(s) within the areas targeted by this assay, and inadequate number of viral copies (<250 copies / mL). A negative result must be combined with clinical observations, patient history, and epidemiological information. Fact Sheet for Patients:   StrictlyIdeas.no Fact Sheet for Healthcare Providers: BankingDealers.co.za This test is not yet approved or cleared  by the [place] FDA and has been authorized for detection and/or diagnosis of SARS-CoV-2 by FDA under an Emergency Use Authorization (EUA).  This EUA will  remain in effect (meaning this test can be used) for the duration of the COVID-19 declaration under Section 564(b)(1) of the Act, 21 U.S.C. section 360bbb-3(b)(1), unless the authorization is terminated or revoked sooner. Performed at [facility], [address] [place], [place] [address]   MRSA PCR Screening     Status: None   Collection Time: 10/23/19  8:13 PM   Specimen: Nasal Mucosa; Nasopharyngeal  Result Value Ref Range Status   MRSA by PCR NEGATIVE NEGATIVE Final    Comment:        The GeneXpert MRSA Assay (FDA approved for NASAL specimens only), is one component of a comprehensive MRSA colonization surveillance program. It is not intended to diagnose MRSA infection nor to guide or monitor treatment for MRSA infections. Performed at [facility], [address] [place], [place] [id number]   Body fluid culture     Status: None (Preliminary result)   Collection Time: 10/29/19 11:52 PM   Specimen: PATH Cytology Pleural fluid  Result Value Ref Range Status   Specimen Description FLUID PLEURAL  Final   Special Requests NONE  Final   Gram Stain NO WBC SEEN NO ORGANISMS SEEN   Final   Culture   Final    NO GROWTH 2 DAYS Performed at [facility], [place] [address], [place], [place] [address]    Report Status PENDING  Incomplete         Radiology Studies: CT ABDOMEN PELVIS WO CONTRAST  Result Date: 10/30/2019 CLINICAL DATA:  74 year old male with history of prostate cancer. Left upper lobe mass seen on earlier CT. EXAM: CT ABDOMEN AND PELVIS WITHOUT CONTRAST TECHNIQUE: Multidetector CT imaging of the abdomen and pelvis was performed following the standard protocol without IV contrast. COMPARISON:  Chest CT dated 10/30/2019. FINDINGS: Evaluation of this exam is limited in the absence of intravenous contrast. Lower chest: Please see report for chest CT of 10/30/2019. No intra-abdominal free air or free fluid. Hepatobiliary: The liver is grossly  unremarkable. No intrahepatic biliary ductal dilatation. There is irregular and masslike thickening of the gallbladder wall concerning for primary neoplasm or metastatic disease. Further initial evaluation with ultrasound recommended. MRI may provide better evaluation depending on the ultrasound findings. No calcified gallstone or pericholecystic fluid. Pancreas: Unremarkable. No pancreatic ductal dilatation or surrounding inflammatory changes. Spleen: Normal in  size without focal abnormality. Adrenals/Urinary Tract: Indeterminate bilateral adrenal thickening or hyperplasia. Moderate bilateral renal parenchyma atrophy. There is a 15 mm left renal hypodense lesion which demonstrates fluid attenuation, likely a cyst. Additional smaller hypodense lesions are too small to characterize. There is no hydronephrosis on either side. The visualized ureters appear unremarkable. Excreted contrast from recent CT is noted within the bladder. Stomach/Bowel: There is extensive sigmoid diverticulosis and scattered colonic diverticula without active inflammatory changes. There is no bowel obstruction or active inflammation. The appendix is normal. Vascular/Lymphatic: Advanced aortoiliac atherosclerotic disease. Diffuse ectasia of the abdominal aorta with borderline aneurysmal dilatation of the infrarenal abdominal aorta measuring up to 3 cm in diameter. The IVC is unremarkable. Bilateral common iliac artery stents noted. No portal venous gas. There is no adenopathy. Reproductive: Prostate brachytherapy seeds. Other: Paucity of subcutaneous fat. Musculoskeletal: Degenerative changes of the spine with findings of renal osteodystrophy. No acute osseous pathology. Mild bilateral femoral head avascular necrosis. IMPRESSION: 1. Irregular and masslike thickening of the gallbladder wall concerning for primary neoplasm or metastatic disease. Further initial evaluation with ultrasound recommended. 2. Extensive colonic diverticulosis. No bowel  obstruction. Normal appendix. 3. Aortic Atherosclerosis (ICD10-I70.0). 4. A 3 cm infrarenal abdominal aortic aneurysm. Recommend followup by ultrasound in 3 years. This recommendation follows ACR consensus guidelines: White Paper of the ACR Incidental Findings Committee II on Vascular Findings. J Am Coll Radiol 2013; 10:789-794. Aortic aneurysm NOS (ICD10-I71.9) Electronically Signed   By: Anner Crete M.D.   On: 10/30/2019 19:40   DG Chest Port 1 View  Result Date: 11/01/2019 CLINICAL DATA:  Pleural effusion EXAM: PORTABLE CHEST 1 VIEW COMPARISON:  Oct 30, 2019 FINDINGS: The heart size and mediastinal contours ar unchanged with cardiomegaly which is partially obscured. Again noted is a peripherally based rounded airspace opacity in the left upper lung. A large left effusion/consolidation is seen. The right lung is clear. No acute osseous abnormality. IMPRESSION: Rounded airspace opacities/nodule seen within the left upper lung as on recent CT. Large left pleural effusion/consolidation Electronically Signed   By: Prudencio Pair M.D.   On: 11/01/2019 05:42        Scheduled Meds: . heparin  5,000 Units Subcutaneous Q8H  . insulin aspart  0-5 Units Subcutaneous QHS  . insulin aspart  0-6 Units Subcutaneous TID WC  . nicotine  21 mg Transdermal Daily   Continuous Infusions:   LOS: 3 days    Time spent:40 min    Saul Dorsi, Geraldo Docker, MD [facility] Pager [phone]  If 7PM-7AM, please contact night-coverage [email or web address] Password [facility] 11/01/2019, 11:26 AM

## 2019-11-01 NOTE — Progress Notes (Signed)
Pt stable for HD tx no c/os vitals stable AVF +/+ ufg 2L.

## 2019-11-01 NOTE — Progress Notes (Signed)
Established hemodialysis patient known at [facility] TTS 12:00. Uses [facility] for transportation. Please call me with any placement related questions.  [name] Dialysis Coordinator [phone]

## 2019-11-01 NOTE — Progress Notes (Signed)
Pulmonary Medicine          Date: 11/01/2019,   MRN# [id number] CORI HENNINGSEN 08/15/1945     AdmissionWeight: 54 kg                 CurrentWeight: 32 kg   Referring physician: Dr. Sherral Hammers   CHIEF COMPLAINT:   Large left pleural effusion with hilar/mediastinal lymphadenopathy concern for lung cancer   SUBJECTIVE    Patient has not had any overnight events.  He is aware of TPC plan. He is asking to go home.    PAST MEDICAL HISTORY   Past Medical History:  Diagnosis Date  . Chronic kidney disease   . Depression   . Diabetes mellitus without complication ([place])   . Glaucoma   . Glaucoma   . Gout   . Heart murmur   . HLD (hyperlipidemia)   . Hypertension   . Prostate cancer ([place])   . Renal failure    dialysis t/t/s  . Stroke ([place])    tia x 2     SURGICAL HISTORY   Past Surgical History:  Procedure Laterality Date  . AMPUTATION TOE Bilateral 07/23/2018   Procedure: TOE MPJ RIGHT 3RD AND LEFT 2ND;  Surgeon: Samara Deist, DPM;  Location: [facility];  Service: Podiatry;  Laterality: Bilateral;  . APPENDECTOMY    . AV FISTULA PLACEMENT Left 06/09/2018   Procedure: ARTERIOVENOUS (AV) FISTULA CREATION ( BRACHIAL CEPHALIC);  Surgeon: Algernon Huxley, MD;  Location: [facility];  Service: Vascular;  Laterality: Left;  . CHOLECYSTECTOMY    . DIALYSIS/PERMA CATHETER INSERTION N/A 03/24/2018   Procedure: DIALYSIS/PERMA CATHETER INSERTION;  Surgeon: Katha Cabal, MD;  Location: [place] [facility];  Service: Cardiovascular;  Laterality: N/A;  . DIALYSIS/PERMA CATHETER REMOVAL N/A 09/06/2018   Procedure: DIALYSIS/PERMA CATHETER REMOVAL;  Surgeon: Algernon Huxley, MD;  Location: [place] [facility];  Service: Cardiovascular;  Laterality: N/A;  . INSERTION PROSTATE RADIATION SEED    . IRRIGATION AND DEBRIDEMENT FOOT Right 12/17/2018   Procedure: IRRIGATION AND DEBRIDEMENT FOOT;  Surgeon: Samara Deist, DPM;  Location: [facility];  Service: Podiatry;  Laterality: Right;  .  LOWER EXTREMITY ANGIOGRAPHY Right 06/16/2018   Procedure: LOWER EXTREMITY ANGIOGRAPHY;  Surgeon: Algernon Huxley, MD;  Location: [place] [facility];  Service: Cardiovascular;  Laterality: Right;  . LOWER EXTREMITY ANGIOGRAPHY Left 08/30/2018   Procedure: LOWER EXTREMITY ANGIOGRAPHY;  Surgeon: Algernon Huxley, MD;  Location: [place] [facility];  Service: Cardiovascular;  Laterality: Left;  . LOWER EXTREMITY ANGIOGRAPHY Right 09/30/2018   Procedure: LOWER EXTREMITY ANGIOGRAPHY;  Surgeon: Algernon Huxley, MD;  Location: [place] [facility];  Service: Cardiovascular;  Laterality: Right;  . LOWER EXTREMITY ANGIOGRAPHY Left 10/07/2018   Procedure: LOWER EXTREMITY ANGIOGRAPHY;  Surgeon: Algernon Huxley, MD;  Location: [place] [facility];  Service: Cardiovascular;  Laterality: Left;  . LOWER EXTREMITY ANGIOGRAPHY Right 12/20/2018   Procedure: Lower Extremity Angiography;  Surgeon: Algernon Huxley, MD;  Location: [place] [facility];  Service: Cardiovascular;  Laterality: Right;  . TRANSMETATARSAL AMPUTATION Bilateral 10/13/2018   Procedure: 1.  Amputation right great toe MTPJ 2.  Amputation right second toe MTPJ 3.  Amputation right fourth toe MTPJ 4.  Amputation right fifth toe MTPJ 5.  Excision distal second metatarsal head right foot 6.  Excision distal third metatarsal head right foot 7.  Amputation left third toe 8.  Incision and drainage infectionleft 2nd toe amputation site. ;  Surgeon: Samara Deist, DPM;  Location     FAMILY HISTORY   Family History  Problem Relation Age of Onset  . Varicose Veins Neg Hx      SOCIAL HISTORY   Social History   Tobacco Use  . Smoking status: Current Some Day Smoker    Packs/day: 0.10    Types: Cigarettes  . Smokeless tobacco: Never Used  Substance Use Topics  . Alcohol use: Not Currently    Alcohol/week: 0.0 standard drinks  . Drug use: Never     MEDICATIONS    Home Medication:    Current Medication:  Current Facility-Administered Medications:  .   [START ON 11/02/2019] heparin injection 5,000 Units, 5,000 Units, Subcutaneous, Q8H, Ardis Rowan, PA-C .  insulin aspart (novoLOG) injection 0-5 Units, 0-5 Units, Subcutaneous, QHS, Garba, Mohammad L, MD .  insulin aspart (novoLOG) injection 0-6 Units, 0-6 Units, Subcutaneous, TID WC, Garba, Mohammad L, MD .  nicotine (NICODERM CQ - dosed in mg/24 hours) patch 21 mg, 21 mg, Transdermal, Daily, Allie Bossier, MD, 21 mg at 11/01/19 1319 .  ondansetron (ZOFRAN) tablet 4 mg, 4 mg, Oral, Q6H PRN **OR** ondansetron (ZOFRAN) injection 4 mg, 4 mg, Intravenous, Q6H PRN, Jonelle Sidle, Mohammad L, MD    ALLERGIES   Penicillin g     REVIEW OF SYSTEMS    Review of Systems:  Gen:  Denies  fever, sweats, chills weigh loss  HEENT: Denies blurred vision, double vision, ear pain, eye pain, hearing loss, nose bleeds, sore throat Cardiac:  No dizziness, chest pain or heaviness, chest tightness,edema Resp:   Denies cough or sputum porduction, shortness of breath,wheezing, hemoptysis,  Gi: Denies swallowing difficulty, stomach pain, nausea or vomiting, diarrhea, constipation, bowel incontinence Gu:  Denies bladder incontinence, burning urine Ext:   Denies Joint pain, stiffness or swelling Skin: Denies  skin rash, easy bruising or bleeding or hives Endoc:  Denies polyuria, polydipsia , polyphagia or weight change Psych:   Denies depression, insomnia or hallucinations   Other:  All other systems negative   VS: BP (!) 148/72 (BP Location: Left Arm)   Pulse 68   Temp 98.8 F (37.1 C) (Oral)   Resp 18   Ht 6\' 1"  (1.854 m)   Wt 59 kg   SpO2 100%   BMI 17.15 kg/m      PHYSICAL EXAM    GENERAL:NAD, no fevers, chills, no weakness no fatigue HEAD: Normocephalic, atraumatic.  EYES: Pupils equal, round, reactive to light. Extraocular muscles intact. No scleral icterus.  MOUTH: Moist mucosal membrane. Dentition intact. No abscess noted.  EAR, NOSE, THROAT: Clear without exudates. No external  lesions.  NECK: Supple. No thyromegaly. No nodules. No JVD.  PULMONARY: Mild rhonchi bilaterally with decreased breath sounds at the left base CARDIOVASCULAR: S1 and S2. Regular rate and rhythm. No murmurs, rubs, or gallops. No edema. Pedal pulses 2+ bilaterally.  GASTROINTESTINAL: Soft, nontender, nondistended. No masses. Positive bowel sounds. No hepatosplenomegaly.  MUSCULOSKELETAL: No swelling, clubbing, or edema. Range of motion full in all extremities.  NEUROLOGIC: Cranial nerves II through XII are intact. No gross focal neurological deficits. Sensation intact. Reflexes intact.  SKIN: No ulceration, lesions, rashes, or cyanosis. Skin warm and dry. Turgor intact.  PSYCHIATRIC: Mood, affect within normal limits. The patient is awake, alert and oriented x 3. Insight, judgment intact.       IMAGING    CT ABDOMEN PELVIS WO CONTRAST  Result Date: 10/30/2019 CLINICAL DATA:  74 year old male with history of prostate cancer.  Left upper lobe mass seen on earlier CT. EXAM: CT ABDOMEN AND PELVIS WITHOUT CONTRAST TECHNIQUE: Multidetector CT imaging of the abdomen and pelvis was performed following the standard protocol without IV contrast. COMPARISON:  Chest CT dated 10/30/2019. FINDINGS: Evaluation of this exam is limited in the absence of intravenous contrast. Lower chest: Please see report for chest CT of 10/30/2019. No intra-abdominal free air or free fluid. Hepatobiliary: The liver is grossly unremarkable. No intrahepatic biliary ductal dilatation. There is irregular and masslike thickening of the gallbladder wall concerning for primary neoplasm or metastatic disease. Further initial evaluation with ultrasound recommended. MRI may provide better evaluation depending on the ultrasound findings. No calcified gallstone or pericholecystic fluid. Pancreas: Unremarkable. No pancreatic ductal dilatation or surrounding inflammatory changes. Spleen: Normal in size without focal abnormality. Adrenals/Urinary  Tract: Indeterminate bilateral adrenal thickening or hyperplasia. Moderate bilateral renal parenchyma atrophy. There is a 15 mm left renal hypodense lesion which demonstrates fluid attenuation, likely a cyst. Additional smaller hypodense lesions are too small to characterize. There is no hydronephrosis on either side. The visualized ureters appear unremarkable. Excreted contrast from recent CT is noted within the bladder. Stomach/Bowel: There is extensive sigmoid diverticulosis and scattered colonic diverticula without active inflammatory changes. There is no bowel obstruction or active inflammation. The appendix is normal. Vascular/Lymphatic: Advanced aortoiliac atherosclerotic disease. Diffuse ectasia of the abdominal aorta with borderline aneurysmal dilatation of the infrarenal abdominal aorta measuring up to 3 cm in diameter. The IVC is unremarkable. Bilateral common iliac artery stents noted. No portal venous gas. There is no adenopathy. Reproductive: Prostate brachytherapy seeds. Other: Paucity of subcutaneous fat. Musculoskeletal: Degenerative changes of the spine with findings of renal osteodystrophy. No acute osseous pathology. Mild bilateral femoral head avascular necrosis. IMPRESSION: 1. Irregular and masslike thickening of the gallbladder wall concerning for primary neoplasm or metastatic disease. Further initial evaluation with ultrasound recommended. 2. Extensive colonic diverticulosis. No bowel obstruction. Normal appendix. 3. Aortic Atherosclerosis (ICD10-I70.0). 4. A 3 cm infrarenal abdominal aortic aneurysm. Recommend followup by ultrasound in 3 years. This recommendation follows ACR consensus guidelines: White Paper of the ACR Incidental Findings Committee II on Vascular Findings. J Am Coll Radiol 2013; 10:789-794. Aortic aneurysm NOS (ICD10-I71.9) Electronically Signed   By: Anner Crete M.D.   On: 10/30/2019 19:40   CT CHEST W CONTRAST  Result Date: 10/30/2019 CLINICAL DATA:  Left  pleural effusion. Thoracentesis yesterday demonstrated bloody fluid. Malignancy suspected. EXAM: CT CHEST WITH CONTRAST TECHNIQUE: Multidetector CT imaging of the chest was performed during intravenous contrast administration. CONTRAST:  48mL OMNIPAQUE IOHEXOL 300 MG/ML  SOLN COMPARISON:  Recent chest x-rays FINDINGS: Cardiovascular: Atherosclerotic changes are seen in the thoracic aorta. The ascending thoracic aorta measures up to 4.1 cm, mildly aneurysmal. The central pulmonary arteries are normal. Coronary artery calcifications involving the right and left coronary arteries. The heart is unremarkable. Mediastinum/Nodes: There is a mildly prominent node in the base of the neck on the left measuring 9 mm in short axis on series 2, image 14, asymmetric to the right. There is adenopathy with a node measuring 13 mm in short axis in the prevascular space. There is a prominent node in the AP window measuring 15 mm on series 2, image 56. No subcarinal adenopathy. No right-sided adenopathy identified. There is a moderate to large left effusion and a tiny right effusion. No pericardial effusion identified. The thyroid is unremarkable. The esophagus is normal. Lungs/Pleura: There is a spiculated mass in the left upper lobe with central cavitation. Platelike opacity  extends off the inferior aspect of the mass, likely associated atelectasis. The mass extends to the pleural surface with some adjacent pleural fluid and pleural thickening. The mass appears to measure 3.2 by 1.7 by 1.6 cm in craniocaudal, transverse, and AP dimensions. There is a moderate left-sided pleural effusion with underlying opacity, likely atelectasis. No centrally obstructing mass is identified on the left. There is a small right effusion with associated atelectasis. Emphysematous changes are seen in the lungs. Upper Abdomen: Visualized liver and gallbladder are normal. The right kidney is relatively atrophic. The left kidney is not visualized. The left  adrenal gland is mildly prominent as seen on series 2, image 162 but not fully evaluated. No other abnormalities in the upper abdomen. Musculoskeletal: No chest wall abnormality. No acute or significant osseous findings. IMPRESSION: 1. Spiculated cavitated mass in the left upper lobe is above highly suspicious for malignancy. Linear opacity extending off the inferior mass is likely atelectasis. The mass extends to the left pleural surface with mild associated pleural thickening and fluid. Given history, the large left pleural effusion is likely malignant. 2. Adenopathy in the mediastinum worrisome for metastatic disease. A PET-CT could better evaluate. 3. 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 4. Mildly prominent left adrenal gland, incompletely evaluated. Recommend attention on PET-CT. 5. Coronary artery calcifications. 6. Atelectasis in the bases. 7. Emphysematous changes in the lungs. Aortic Atherosclerosis (ICD10-I70.0) and Emphysema (ICD10-J43.9). Electronically Signed   By: Dorise Bullion III M.D   On: 10/30/2019 10:57   US Pelvis Limited  Result Date: 10/22/2019 CLINICAL DATA:  74 year old male with a history of urinary frequency EXAM: LIMITED ULTRASOUND OF PELVIS TECHNIQUE: Limited transabdominal ultrasound examination of the pelvis was performed. COMPARISON:  None. FINDINGS: Urinary bladder measures 440 cc prevoid, 403 cc post void. Prostate volume estimated 15 cc. IMPRESSION: Significant post void residual indicating urinary retention. Prostate volume estimated 15 cc. Electronically Signed   By: Corrie Mckusick D.O.   On: 10/22/2019 12:24   DG Chest Port 1 View  Result Date: 11/01/2019 CLINICAL DATA:  Pleural effusion EXAM: PORTABLE CHEST 1 VIEW COMPARISON:  Oct 30, 2019 FINDINGS: The heart size and mediastinal contours ar unchanged with cardiomegaly which is partially obscured. Again noted is a peripherally based rounded airspace opacity in the left upper lung.  A large left effusion/consolidation is seen. The right lung is clear. No acute osseous abnormality. IMPRESSION: Rounded airspace opacities/nodule seen within the left upper lung as on recent CT. Large left pleural effusion/consolidation Electronically Signed   By: Prudencio Pair M.D.   On: 11/01/2019 05:42   DG Chest Port 1 View  Result Date: 10/29/2019 CLINICAL DATA:  74 year old male status post thoracentesis. EXAM: PORTABLE CHEST 1 VIEW COMPARISON:  Earlier radiograph dated 10/29/2019. FINDINGS: Interval decrease in the size of the left pleural effusion with moderate residual effusion. There is aerated portion of the left upper lobe. The right lung is clear. No pneumothorax. Stable cardiomediastinal silhouette. No acute osseous pathology. IMPRESSION: Interval decrease in the size of the left pleural effusion with moderate residual effusion. No pneumothorax. Electronically Signed   By: Anner Crete M.D.   On: 10/29/2019 23:34   DG Chest Port 1 View  Result Date: 10/29/2019 CLINICAL DATA:  Dyspnea. Abdominal pain and vomiting while at dialysis today, unable to get complete treatment. EXAM: PORTABLE CHEST 1 VIEW COMPARISON:  Radiograph 10/22/2019 FINDINGS: Progressive left pleural effusion with near complete opacification of the left hemithorax, small portion of left apical  lung is aerated. There is rightward mediastinal shift suggesting large fluid volume rather than compressive atelectasis. Mediastinal contours are obscured by left lung opacity. Probable right perihilar atelectasis. No significant right pleural effusion. No pneumothorax. IMPRESSION: Progressive left pleural effusion over the past week with near complete opacification of the left hemithorax. Rightward mediastinal shift suggesting large fluid volume volume rather than passive atelectasis. Electronically Signed   By: Keith Rake M.D.   On: 10/29/2019 20:45   DG Chest Portable 1 View  Result Date: 10/22/2019 CLINICAL DATA:   Productive cough for 4 days with congestion and watery eyes. Current smoker. History of diabetes. EXAM: PORTABLE CHEST 1 VIEW COMPARISON:  Chest radiographs 06/12/2018 and 03/21/2018. FINDINGS: 1141 hours. Dialysis catheter has been removed in the interval. The visualized heart size and mediastinal contours are stable with mild aortic atherosclerosis. There is a new moderate-sized left pleural effusion with associated left basilar pulmonary opacity. No significant right pleural effusion. No pneumothorax. The pulmonary vasculature is slightly indistinct, and there may be mild pulmonary edema. No significant osseous findings. IMPRESSION: New moderate-sized left pleural effusion with associated left basilar atelectasis or infiltrate. Possible mild pulmonary edema. Electronically Signed   By: Richardean Sale M.D.   On: 10/22/2019 12:18   US THORACENTESIS ASP PLEURAL SPACE W/IMG GUIDE  Result Date: 10/29/2019 INDICATION: 74 year old with shortness of breath and large left pleural effusion. EXAM: ULTRASOUND GUIDED LEFT THORACENTESIS MEDICATIONS: None. COMPLICATIONS: None immediate. PROCEDURE: An ultrasound guided thoracentesis was thoroughly discussed with the patient and questions answered. The benefits, risks, alternatives and complications were also discussed. The patient understands and wishes to proceed with the procedure. Written consent was obtained. Ultrasound was performed to localize and mark an adequate pocket of fluid in the left chest. The area was then prepped and draped in the normal sterile fashion. 1% Lidocaine was used for local anesthesia. Under ultrasound guidance a 6 Fr Safe-T-Centesis catheter was introduced. Thoracentesis was performed. The catheter was removed and a dressing applied. FINDINGS: A total of approximately 2.35 L of bloody fluid was removed. Samples were sent to the laboratory as requested by the clinical team. IMPRESSION: Successful ultrasound guided left thoracentesis yielding  2.35 L of pleural fluid. Electronically Signed   By: Markus Daft M.D.   On: 10/29/2019 23:26      ASSESSMENT/PLAN   Large left pleural effusion with hilar and mediastinal lymphadenopathy as well as left apical spiculated lesion    -Constellation of findings suggestive of possible pulmonary malignancy    -Patient reports chills mild diaphoresis as well as recent weight loss concerning for constitutional symptoms    -High pretest probability for malignancy due to lifelong smoking from age 44 to current active smoking status approximately 1/2 pack daily-for total over 70 pack years smoking history   -Status post thoracentesis-cytology is pending   -Discussed endobronchial ultrasound assisted lymph node biopsies-patient is agreeable   -Currently patient's respiratory status significantly improved post thoracentesis and he may need only outpatient work-up.   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     Thank you for allowing me to participate in the care of this patient.   Patient/Family are satisfied with care plan and all questions have been answered.   This document was prepared using Dragon voice recognition software and may include unintentional dictation errors.     Ottie Glazier, M.D.  Division of Pulmonary & Critical Care  [place]

## 2019-11-01 NOTE — TOC Initial Note (Signed)
Transition of Care [facility]) - Initial/Assessment Note    Patient Details  Name: Jose [name] MRN: [id number] Date of Birth: 03-22-1946  [facility]) CM/SW Contact:    Su Hilt, RN Phone Number: 11/01/2019, 3:53 PM  Clinical Narrative:                 Met with the patient in the room He lives at home with his sister He uses [facility] for transportation to and from dialysis  Has Dialysis Tue, Thur and Sat He has a RW and a cane at home, he feels he would benefit from a 3 in 1, I notified [place] with [employer] and it will be brought into the room The patient stated that he used to [facility] but they are not seeing him any longer, he stated that he will need a nurse again and aide and PT The patient is on Oxygen acutley Will continue to monitor for home oxygen needs He stated that he is mostly independent at home with assisted devices    Expected Discharge Plan: [place] Barriers to Discharge: Continued Medical Work up   Patient Goals and CMS Choice Patient states their goals for this hospitalization and ongoing recovery are:: go home      Expected Discharge Plan and Services Expected Discharge Plan: [place]   Discharge Planning Services: CM Consult   Living arrangements for the past 2 months: Single Family Home                 DME Arranged: 3-N-1 DME Agency: [employer] Date DME Agency Contacted: 11/01/19 Time DME Agency Contacted: [phone] Representative spoke with at DME Agency: [place] Arranged: RN, Nurse's Aide          Prior Living Arrangements/Services Living arrangements for the past 2 months: [place] Lives with:: Relatives(sister) Patient language and need for interpreter reviewed:: Yes Do you feel safe going back to the place where you live?: Yes      Need for Family Participation in Patient Care: No (Comment) Care giver support system in place?: Yes (comment) Current home  services: DME(rolling walker, quad cane) Criminal Activity/Legal Involvement Pertinent to Current Situation/Hospitalization: No - Comment as needed  Activities of Daily Living Home Assistive Devices/Equipment: None ADL Screening (condition at time of admission) Patient's cognitive ability adequate to safely complete daily activities?: Yes Is the patient deaf or have difficulty hearing?: No Does the patient have difficulty seeing, even when wearing glasses/contacts?: No Does the patient have difficulty concentrating, remembering, or making decisions?: No Patient able to express need for assistance with ADLs?: Yes Does the patient have difficulty dressing or bathing?: No Independently performs ADLs?: Yes (appropriate for developmental age) Does the patient have difficulty walking or climbing stairs?: No Weakness of Legs: None Weakness of Arms/Hands: None  Permission Sought/Granted   Permission granted to share information with : Yes, Verbal Permission Granted              Emotional Assessment Appearance:: Appears older than stated age Attitude/Demeanor/Rapport: Engaged Affect (typically observed): Appropriate Orientation: : Oriented to Place, Oriented to Self, Oriented to Situation, Oriented to  Time Alcohol / Substance Use: Not Applicable Psych Involvement: No (comment)  Admission diagnosis:  Dyspnea [R06.00] SOB (shortness of breath) [R06.02] Hypoxia [R09.02] S/P thoracentesis [Z98.890] Patient Active Problem List   Diagnosis Date Noted  . Hypokalemia 10/30/2019  . Chronic diastolic CHF (congestive heart failure) ([place]) 10/30/2019  .  Benign essential HTN 10/30/2019  . Malignant neoplasm of unknown origin ([place]) 10/30/2019  . Acute respiratory failure with hypoxia ([place]) 10/30/2019  . End-stage renal disease on hemodialysis ([place]) 10/30/2019  . Diabetes mellitus type 2, controlled, with complications ([place]) 21/78/3754  . SOB (shortness of breath) 10/29/2019  . Pulmonary edema  10/22/2019  . Diarrhea 10/22/2019  . Acute on chronic diastolic CHF (congestive heart failure) ([place]) 10/22/2019  . HLD (hyperlipidemia)   . Stroke ([place])   . Depression   . Tobacco abuse   . Pleural effusion on left   . Acute urinary retention   . Foot ulcer ([place]) 12/15/2018  . Ischemic foot 10/11/2018  . Atherosclerosis of native arteries of the extremities with gangrene ([place]) 07/09/2018  . Gangrene of toe of right foot ([place]) 06/13/2018  . Type II diabetes mellitus with renal manifestations ([place]) 04/16/2018  . Hypertension 04/16/2018  . ESRD on dialysis ([place]) 04/16/2018  . Malnutrition of moderate degree 03/27/2018  . Renal failure 03/21/2018   PCP:  [name], MD Pharmacy:   [id number] [place]Phone: [phone]: [phone] [address] [place] [id number]: [phone]: [phone]    Social Determinants of Health (SDOH) Interventions    Readmission Risk Interventions Readmission Risk Prevention Plan 12/20/2018 10/15/2018 10/14/2018  Transportation Screening Complete Complete Complete  PCP or Specialist Appt within 3-5 Days - (No Data) -  [place] [facility] [place] Consult Complete - Complete  Social Work Consult for [place]anning/Counseling - - Not Complete  Palliative Care Screening - - Not Applicable  Medication Review (Press photographer Complete - Complete  Some recent data might be hidden

## 2019-11-01 NOTE — Progress Notes (Signed)
[facility]  ROUNDING NOTE   Subjective:  Patient seen and evaluated during dialysis treatment. Tolerating well.  Objective:  Vital signs in last 24 hours:  Temp:  [98.4 F (36.9 C)-98.7 F (37.1 C)] 98.4 F (36.9 C) (06/01 0855) Pulse Rate:  [61-70] 61 (06/01 0900) Resp:  [15-19] 15 (06/01 0900) BP: (130-149)/(67-75) 149/70 (06/01 0900) SpO2:  [98 %-100 %] 98 % (06/01 0900) Weight:  [59 kg] 59 kg (06/01 0642)  Weight change: 3.568 kg Filed Weights   10/29/19 1349 10/31/19 0643 11/01/19 0642  Weight: 54 kg 55.4 kg 59 kg    Intake/Output: I/O last 3 completed shifts: In: 480 [P.O.:480] Out: 250 [Urine:250]   Intake/Output this shift:  No intake/output data recorded.  Physical Exam: General: No acute distress  Head: Normocephalic, atraumatic. Moist oral mucosal membranes  Eyes: Anicteric  Neck: Supple, trachea midline  Lungs:  Slightly diminished at left base  Heart: S1S2 no rubs  Abdomen:  Soft, nontender, bowel sounds present  Extremities: No peripheral edema.  Neurologic: Awake, alert, following commands  Skin: No lesions  Access: LUE AV access    Basic Metabolic Panel: Recent Labs  Lab 10/29/19 1403 10/29/19 1403 10/30/19 0207 10/31/19 0332 11/01/19 0405  NA 139  --  142 136 133*  K 3.3*  --  4.3 4.6 4.9  CL 98  --  101 97* 95*  CO2 24  --  29 26 25   GLUCOSE 199*  --  95 64* 71  BUN 59*  --  68* 74* 82*  CREATININE 6.95*  --  8.26* 8.49* 8.64*  CALCIUM 8.5*   < > 7.2* 7.5* 7.8*  MG  --   --   --  2.1 2.1  PHOS  --   --   --  5.8* 5.8*   < > = values in this interval not displayed.    Liver Function Tests: Recent Labs  Lab 10/29/19 1403 10/31/19 0332 11/01/19 0405  AST 17 14* 11*  ALT 11 9 11   ALKPHOS 49 43 40  BILITOT 1.0 0.7 0.7  PROT 7.2 6.2* 5.9*  ALBUMIN 3.3* 2.7* 2.6*   Recent Labs  Lab 10/29/19 1403  LIPASE 41   No results for input(s): AMMONIA in the last 168 hours.  CBC: Recent Labs  Lab 10/29/19 1403  10/30/19 0207 10/31/19 0332 11/01/19 0405  WBC 15.0* 12.8* 9.7 9.3  NEUTROABS  --   --  6.8 6.1  HGB 12.8* 12.2* 11.3* 11.0*  HCT 37.2* 36.7* 34.5* 33.4*  MCV 95.6 100.3* 100.9* 99.1  PLT 279 249 245 259    Cardiac Enzymes: No results for input(s): CKTOTAL, CKMB, CKMBINDEX, TROPONINI in the last 168 hours.  BNP: Invalid input(s): POCBNP  CBG: Recent Labs  Lab 10/31/19 0758 10/31/19 1153 10/31/19 1634 10/31/19 2107 11/01/19 0756  GLUCAP 93 119* 80 81 31*    Microbiology: Results for orders placed or performed during the hospital encounter of 10/29/19  Body fluid culture     Status: None (Preliminary result)   Collection Time: 10/29/19 11:52 PM   Specimen: PATH Cytology Pleural fluid  Result Value Ref Range Status   Specimen Description FLUID PLEURAL  Final   Special Requests NONE  Final   Gram Stain NO WBC SEEN NO ORGANISMS SEEN   Final   Culture   Final    NO GROWTH 2 DAYS Performed at [facility], [place] [address], [place], [place] [address]    Report Status PENDING  Incomplete    Coagulation Studies: Recent Labs    10/29/19 August 14, 2215  LABPROT 13.4  INR 1.1    Urinalysis: No results for input(s): COLORURINE, LABSPEC, PHURINE, GLUCOSEU, HGBUR, BILIRUBINUR, KETONESUR, PROTEINUR, UROBILINOGEN, NITRITE, LEUKOCYTESUR in the last 72 hours.  Invalid input(s): APPERANCEUR    Imaging: CT ABDOMEN PELVIS WO CONTRAST  Result Date: 10/30/2019 CLINICAL DATA:  74 year old male with history of prostate cancer. Left upper lobe mass seen on earlier CT. EXAM: CT ABDOMEN AND PELVIS WITHOUT CONTRAST TECHNIQUE: Multidetector CT imaging of the abdomen and pelvis was performed following the standard protocol without IV contrast. COMPARISON:  Chest CT dated 10/30/2019. FINDINGS: Evaluation of this exam is limited in the absence of intravenous contrast. Lower chest: Please see report for chest CT of 10/30/2019. No intra-abdominal free air or free fluid. Hepatobiliary: The liver  is grossly unremarkable. No intrahepatic biliary ductal dilatation. There is irregular and masslike thickening of the gallbladder wall concerning for primary neoplasm or metastatic disease. Further initial evaluation with ultrasound recommended. MRI may provide better evaluation depending on the ultrasound findings. No calcified gallstone or pericholecystic fluid. Pancreas: Unremarkable. No pancreatic ductal dilatation or surrounding inflammatory changes. Spleen: Normal in size without focal abnormality. Adrenals/Urinary Tract: Indeterminate bilateral adrenal thickening or hyperplasia. Moderate bilateral renal parenchyma atrophy. There is a 15 mm left renal hypodense lesion which demonstrates fluid attenuation, likely a cyst. Additional smaller hypodense lesions are too small to characterize. There is no hydronephrosis on either side. The visualized ureters appear unremarkable. Excreted contrast from recent CT is noted within the bladder. Stomach/Bowel: There is extensive sigmoid diverticulosis and scattered colonic diverticula without active inflammatory changes. There is no bowel obstruction or active inflammation. The appendix is normal. Vascular/Lymphatic: Advanced aortoiliac atherosclerotic disease. Diffuse ectasia of the abdominal aorta with borderline aneurysmal dilatation of the infrarenal abdominal aorta measuring up to 3 cm in diameter. The IVC is unremarkable. Bilateral common iliac artery stents noted. No portal venous gas. There is no adenopathy. Reproductive: Prostate brachytherapy seeds. Other: Paucity of subcutaneous fat. Musculoskeletal: Degenerative changes of the spine with findings of renal osteodystrophy. No acute osseous pathology. Mild bilateral femoral head avascular necrosis. IMPRESSION: 1. Irregular and masslike thickening of the gallbladder wall concerning for primary neoplasm or metastatic disease. Further initial evaluation with ultrasound recommended. 2. Extensive colonic  diverticulosis. No bowel obstruction. Normal appendix. 3. Aortic Atherosclerosis (ICD10-I70.0). 4. A 3 cm infrarenal abdominal aortic aneurysm. Recommend followup by ultrasound in 3 years. This recommendation follows ACR consensus guidelines: White Paper of the ACR Incidental Findings Committee II on Vascular Findings. J Am Coll Radiol 2013; 10:789-794. Aortic aneurysm NOS (ICD10-I71.9) Electronically Signed   By: Anner Crete M.D.   On: 10/30/2019 19:40   DG Chest Port 1 View  Result Date: 11/01/2019 CLINICAL DATA:  Pleural effusion EXAM: PORTABLE CHEST 1 VIEW COMPARISON:  Oct 30, 2019 FINDINGS: The heart size and mediastinal contours ar unchanged with cardiomegaly which is partially obscured. Again noted is a peripherally based rounded airspace opacity in the left upper lung. A large left effusion/consolidation is seen. The right lung is clear. No acute osseous abnormality. IMPRESSION: Rounded airspace opacities/nodule seen within the left upper lung as on recent CT. Large left pleural effusion/consolidation Electronically Signed   By: Prudencio Pair M.D.   On: 11/01/2019 05:42     Medications:    . heparin  5,000 Units Subcutaneous Q8H  . insulin aspart  0-5 Units Subcutaneous QHS  . insulin aspart  0-6 Units Subcutaneous TID WC  .  nicotine  21 mg Transdermal Daily   ondansetron **OR** ondansetron (ZOFRAN) IV  Assessment/ Plan:  74 y.o. male with past medical history of ESRD on HD, diabetes mellitus type 2, history of prostate cancer, history of CVA, peripheral vascular disease, bilateral transmetatarsal amputation, anemia of chronic kidney disease, secondary hyperparathyroidism who was admitted with recurrent left pleural effusion.  [facility] [facility]/LUE AVF  1.  ESRD on HD TTS.  Patient seen and evaluated during dialysis treatment.  Tolerating well.  2.  Anemia of chronic kidney disease.  Hold Epogen at this time patient has since cancer diagnosis.  3.  Secondary  hyperparathyroidism.  This should come down with ongoing dialysis treatment.  4.  Left pleural effusion/left upper lobe lung mass.  Recurrent in nature.  Underwent thoracentesis 10/29/2019 removing 2.3 L of bloody fluid.  CT chest now reveals spiculated left upper lobe lung mass.  Further work-up per hospitalist.   LOS: 3 Jose [name] 6/1/202111:00 AM

## 2019-11-01 NOTE — Progress Notes (Signed)
PT TOLERATED TX WELL SYSTEM CLOTTED X2 TX TERMINATED VITALS STABLE PT STABLE UFG NOT ACHIEVED AVF+/+.

## 2019-11-01 NOTE — H&P (Signed)
Chief Complaint: Recurrent pleural effusion  Referring Physician(s): Allie Bossier, MD  Supervising Physician: Aletta Edouard  Patient Status: Echo - In-pt  History of Present Illness: Jose [name] is a 74 y.o. male who presented to the ED on 5/29 with increasing shortness of breath.  His known medical issues include ESRD on hemodialysis and history of prostate cancer.  Images showed a large left pleural effusion.  He had a thoracentesis by Dr. Anselm Pancoast with 2.35 liters bloody fluid removed.  CT scan done 5/30 showed = Spiculated cavitated mass in the left upper lobe is above highly suspicious for malignancy. Linear opacity extending off the inferior mass is likely atelectasis. The mass extends to the left pleural surface with mild associated pleural thickening and fluid. Given history, the large left pleural effusion is likely malignant. Adenopathy in the mediastinum worrisome for metastatic disease.  We are asked to place a tunneled pleural catheter.  Past Medical History:  Diagnosis Date   Chronic kidney disease    Depression    Diabetes mellitus without complication (HCC)    Glaucoma    Glaucoma    Gout    Heart murmur    HLD (hyperlipidemia)    Hypertension    Prostate cancer ([place])    Renal failure    dialysis t/t/s   Stroke ([place])    tia x 2    Past Surgical History:  Procedure Laterality Date   AMPUTATION TOE Bilateral 07/23/2018   Procedure: TOE MPJ RIGHT 3RD AND LEFT 2ND;  Surgeon: Samara Deist, DPM;  Location: [facility];  Service: Podiatry;  Laterality: Bilateral;   APPENDECTOMY     AV FISTULA PLACEMENT Left 06/09/2018   Procedure: ARTERIOVENOUS (AV) FISTULA CREATION ( BRACHIAL CEPHALIC);  Surgeon: Algernon Huxley, MD;  Location: [facility];  Service: Vascular;  Laterality: Left;   CHOLECYSTECTOMY     DIALYSIS/PERMA CATHETER INSERTION N/A 03/24/2018   Procedure: DIALYSIS/PERMA CATHETER INSERTION;  Surgeon: Katha Cabal, MD;  Location: [place] [facility];  Service: Cardiovascular;  Laterality: N/A;   DIALYSIS/PERMA CATHETER REMOVAL N/A 09/06/2018   Procedure: DIALYSIS/PERMA CATHETER REMOVAL;  Surgeon: Algernon Huxley, MD;  Location: [place] [facility];  Service: Cardiovascular;  Laterality: N/A;   INSERTION PROSTATE RADIATION SEED     IRRIGATION AND DEBRIDEMENT FOOT Right 12/17/2018   Procedure: IRRIGATION AND DEBRIDEMENT FOOT;  Surgeon: Samara Deist, DPM;  Location: [facility];  Service: Podiatry;  Laterality: Right;   LOWER EXTREMITY ANGIOGRAPHY Right 06/16/2018   Procedure: LOWER EXTREMITY ANGIOGRAPHY;  Surgeon: Algernon Huxley, MD;  Location: [place] [facility];  Service: Cardiovascular;  Laterality: Right;   LOWER EXTREMITY ANGIOGRAPHY Left 08/30/2018   Procedure: LOWER EXTREMITY ANGIOGRAPHY;  Surgeon: Algernon Huxley, MD;  Location: [place] [facility];  Service: Cardiovascular;  Laterality: Left;   LOWER EXTREMITY ANGIOGRAPHY Right 09/30/2018   Procedure: LOWER EXTREMITY ANGIOGRAPHY;  Surgeon: Algernon Huxley, MD;  Location: [place] [facility];  Service: Cardiovascular;  Laterality: Right;   LOWER EXTREMITY ANGIOGRAPHY Left 10/07/2018   Procedure: LOWER EXTREMITY ANGIOGRAPHY;  Surgeon: Algernon Huxley, MD;  Location: [place] [facility];  Service: Cardiovascular;  Laterality: Left;   LOWER EXTREMITY ANGIOGRAPHY Right 12/20/2018   Procedure: Lower Extremity Angiography;  Surgeon: Algernon Huxley, MD;  Location: [place] [facility];  Service: Cardiovascular;  Laterality: Right;   TRANSMETATARSAL AMPUTATION Bilateral 10/13/2018   Procedure: 1.  Amputation right great toe MTPJ 2.  Amputation right second toe MTPJ  3.  Amputation right fourth toe MTPJ 4.  Amputation right fifth toe MTPJ 5.  Excision distal second metatarsal head right foot 6.  Excision distal third metatarsal head right foot 7.  Amputation left third toe 8.  Incision and drainage infectionleft 2nd toe amputation site. ;  Surgeon: Samara Deist, DPM;  Location     Allergies: Penicillin g  Medications: Prior to Admission medications   Medication Sig Start Date End Date Taking? Authorizing Provider  allopurinol (ZYLOPRIM) 100 MG tablet Take 1 tablet (100 mg total) by mouth daily. 10/24/19   Nicole Kindred A, DO  amLODipine (NORVASC) 10 MG tablet Take 1 tablet (10 mg total) by mouth daily. 10/24/19   Ezekiel Slocumb, DO  aspirin 81 MG EC tablet Take 1 tablet (81 mg total) by mouth daily. 10/24/19   Ezekiel Slocumb, DO  atorvastatin (LIPITOR) 10 MG tablet Take 1 tablet (10 mg total) by mouth daily at 6 PM. 10/24/19   Nicole Kindred A, DO  calcium acetate (PHOSLO) 667 MG capsule Take 2 capsules (1,334 mg total) by mouth 3 (three) times daily with meals. Patient not taking: Reported on 10/22/2019 12/21/18   Fritzi Mandes, MD  cloNIDine (CATAPRES) 0.2 MG tablet Take 1 tablet (0.2 mg total) by mouth 2 (two) times daily. 10/24/19   Ezekiel Slocumb, DO  clopidogrel (PLAVIX) 75 MG tablet Take 1 tablet (75 mg total) by mouth daily. 10/24/19   Nicole Kindred A, DO  escitalopram (LEXAPRO) 10 MG tablet Take 1 tablet (10 mg total) by mouth daily. 10/24/19   Ezekiel Slocumb, DO  glipiZIDE (GLUCOTROL) 5 MG tablet Take 1 tablet (5 mg total) by mouth daily before breakfast. 10/24/19   Nicole Kindred A, DO  hydrALAZINE (APRESOLINE) 25 MG tablet Take 1 tablet (25 mg total) by mouth every 8 (eight) hours. 10/24/19   Ezekiel Slocumb, DO  tamsulosin (FLOMAX) 0.4 MG CAPS capsule Take 1 capsule (0.4 mg total) by mouth daily. 10/25/19   Ezekiel Slocumb, DO  traMADol Veatrice Bourbon) 50 MG tablet 50 mg. 03/16/19   [provider]     Family History  Problem Relation Age of Onset   Varicose Veins Neg Hx     Social History   Socioeconomic History   Marital status: Widowed    Spouse name: Not on file   Number of children: Not on file   Years of education: Not on file   Highest education level: Not on file  Occupational History    Employer: RETIRED  Tobacco Use    Smoking status: Current Some Day Smoker    Packs/day: 0.10    Types: Cigarettes   Smokeless tobacco: Never Used  Substance and Sexual Activity   Alcohol use: Not Currently    Alcohol/week: 0.0 standard drinks   Drug use: Never   Sexual activity: Not Currently  Other Topics Concern   Not on file  Social History Narrative   Not on file   Social Determinants of Health   Financial Resource Strain:    Difficulty of Paying Living Expenses:   Food Insecurity:    Worried About Charity fundraiser in the Last Year:    Arboriculturist in the Last Year:   Transportation Needs:    Film/video editor (Medical):    Lack of Transportation (Non-Medical):   Physical Activity:    Days of Exercise per Week:    Minutes of Exercise per Session:   Stress:    Feeling of  Stress :   Social Connections:    Frequency of Communication with Friends and Family:    Frequency of Social Gatherings with Friends and Family:    Attends Religious Services:    Active Member of Clubs or Organizations:    Attends Archivist Meetings:    Marital Status:      Review of Systems: A 12 point ROS discussed and pertinent positives are indicated in the HPI above.  All other systems are negative.  Review of Systems  Vital Signs: BP (!) 159/62 (BP Location: Left Arm)   Pulse 62   Temp 98.6 F (37 C) (Oral)   Resp 18   Ht 6\' 1"  (1.854 m)   Wt 59 kg   SpO2 100%   BMI 17.15 kg/m   Physical Exam Vitals reviewed.  Constitutional:      Appearance: Normal appearance.  HENT:     Head: Normocephalic and atraumatic.  Eyes:     Extraocular Movements: Extraocular movements intact.  Cardiovascular:     Rate and Rhythm: Normal rate and regular rhythm.  Pulmonary:     Effort: Pulmonary effort is normal. No respiratory distress.     Comments: Breath sounds diminished on left. Abdominal:     General: There is no distension.     Palpations: Abdomen is soft.     Tenderness: There is no abdominal  tenderness.  Musculoskeletal:        General: Normal range of motion.     Cervical back: Normal range of motion.  Skin:    General: Skin is warm and dry.  Neurological:     General: No focal deficit present.     Mental Status: He is alert and oriented to person, place, and time.  Psychiatric:        Mood and Affect: Mood normal.        Behavior: Behavior normal.        Thought Content: Thought content normal.        Judgment: Judgment normal.     Imaging: CT ABDOMEN PELVIS WO CONTRAST  Result Date: 10/30/2019 CLINICAL DATA:  74 year old male with history of prostate cancer. Left upper lobe mass seen on earlier CT. EXAM: CT ABDOMEN AND PELVIS WITHOUT CONTRAST TECHNIQUE: Multidetector CT imaging of the abdomen and pelvis was performed following the standard protocol without IV contrast. COMPARISON:  Chest CT dated 10/30/2019. FINDINGS: Evaluation of this exam is limited in the absence of intravenous contrast. Lower chest: Please see report for chest CT of 10/30/2019. No intra-abdominal free air or free fluid. Hepatobiliary: The liver is grossly unremarkable. No intrahepatic biliary ductal dilatation. There is irregular and masslike thickening of the gallbladder wall concerning for primary neoplasm or metastatic disease. Further initial evaluation with ultrasound recommended. MRI may provide better evaluation depending on the ultrasound findings. No calcified gallstone or pericholecystic fluid. Pancreas: Unremarkable. No pancreatic ductal dilatation or surrounding inflammatory changes. Spleen: Normal in size without focal abnormality. Adrenals/Urinary Tract: Indeterminate bilateral adrenal thickening or hyperplasia. Moderate bilateral renal parenchyma atrophy. There is a 15 mm left renal hypodense lesion which demonstrates fluid attenuation, likely a cyst. Additional smaller hypodense lesions are too small to characterize. There is no hydronephrosis on either side. The visualized ureters appear  unremarkable. Excreted contrast from recent CT is noted within the bladder. Stomach/Bowel: There is extensive sigmoid diverticulosis and scattered colonic diverticula without active inflammatory changes. There is no bowel obstruction or active inflammation. The appendix is normal. Vascular/Lymphatic: Advanced aortoiliac atherosclerotic disease. Diffuse ectasia  of the abdominal aorta with borderline aneurysmal dilatation of the infrarenal abdominal aorta measuring up to 3 cm in diameter. The IVC is unremarkable. Bilateral common iliac artery stents noted. No portal venous gas. There is no adenopathy. Reproductive: Prostate brachytherapy seeds. Other: Paucity of subcutaneous fat. Musculoskeletal: Degenerative changes of the spine with findings of renal osteodystrophy. No acute osseous pathology. Mild bilateral femoral head avascular necrosis. IMPRESSION: 1. Irregular and masslike thickening of the gallbladder wall concerning for primary neoplasm or metastatic disease. Further initial evaluation with ultrasound recommended. 2. Extensive colonic diverticulosis. No bowel obstruction. Normal appendix. 3. Aortic Atherosclerosis (ICD10-I70.0). 4. A 3 cm infrarenal abdominal aortic aneurysm. Recommend followup by ultrasound in 3 years. This recommendation follows ACR consensus guidelines: White Paper of the ACR Incidental Findings Committee II on Vascular Findings. J Am Coll Radiol 2013; 10:789-794. Aortic aneurysm NOS (ICD10-I71.9) Electronically Signed   By: Anner Crete M.D.   On: 10/30/2019 19:40   CT CHEST W CONTRAST  Result Date: 10/30/2019 CLINICAL DATA:  Left pleural effusion. Thoracentesis yesterday demonstrated bloody fluid. Malignancy suspected. EXAM: CT CHEST WITH CONTRAST TECHNIQUE: Multidetector CT imaging of the chest was performed during intravenous contrast administration. CONTRAST:  40mL OMNIPAQUE IOHEXOL 300 MG/ML  SOLN COMPARISON:  Recent chest x-rays FINDINGS: Cardiovascular: Atherosclerotic  changes are seen in the thoracic aorta. The ascending thoracic aorta measures up to 4.1 cm, mildly aneurysmal. The central pulmonary arteries are normal. Coronary artery calcifications involving the right and left coronary arteries. The heart is unremarkable. Mediastinum/Nodes: There is a mildly prominent node in the base of the neck on the left measuring 9 mm in short axis on series 2, image 14, asymmetric to the right. There is adenopathy with a node measuring 13 mm in short axis in the prevascular space. There is a prominent node in the AP window measuring 15 mm on series 2, image 56. No subcarinal adenopathy. No right-sided adenopathy identified. There is a moderate to large left effusion and a tiny right effusion. No pericardial effusion identified. The thyroid is unremarkable. The esophagus is normal. Lungs/Pleura: There is a spiculated mass in the left upper lobe with central cavitation. Platelike opacity extends off the inferior aspect of the mass, likely associated atelectasis. The mass extends to the pleural surface with some adjacent pleural fluid and pleural thickening. The mass appears to measure 3.2 by 1.7 by 1.6 cm in craniocaudal, transverse, and AP dimensions. There is a moderate left-sided pleural effusion with underlying opacity, likely atelectasis. No centrally obstructing mass is identified on the left. There is a small right effusion with associated atelectasis. Emphysematous changes are seen in the lungs. Upper Abdomen: Visualized liver and gallbladder are normal. The right kidney is relatively atrophic. The left kidney is not visualized. The left adrenal gland is mildly prominent as seen on series 2, image 162 but not fully evaluated. No other abnormalities in the upper abdomen. Musculoskeletal: No chest wall abnormality. No acute or significant osseous findings. IMPRESSION: 1. Spiculated cavitated mass in the left upper lobe is above highly suspicious for malignancy. Linear opacity extending  off the inferior mass is likely atelectasis. The mass extends to the left pleural surface with mild associated pleural thickening and fluid. Given history, the large left pleural effusion is likely malignant. 2. Adenopathy in the mediastinum worrisome for metastatic disease. A PET-CT could better evaluate. 3. 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 4. Mildly prominent left adrenal gland, incompletely evaluated. Recommend attention on PET-CT. 5. Coronary artery calcifications.  6. Atelectasis in the bases. 7. Emphysematous changes in the lungs. Aortic Atherosclerosis (ICD10-I70.0) and Emphysema (ICD10-J43.9). Electronically Signed   By: Dorise Bullion III M.D   On: 10/30/2019 10:57   US Pelvis Limited  Result Date: 10/22/2019 CLINICAL DATA:  75 year old male with a history of urinary frequency EXAM: LIMITED ULTRASOUND OF PELVIS TECHNIQUE: Limited transabdominal ultrasound examination of the pelvis was performed. COMPARISON:  None. FINDINGS: Urinary bladder measures 440 cc prevoid, 403 cc post void. Prostate volume estimated 15 cc. IMPRESSION: Significant post void residual indicating urinary retention. Prostate volume estimated 15 cc. Electronically Signed   By: Corrie Mckusick D.O.   On: 10/22/2019 12:24   DG Chest Port 1 View  Result Date: 11/01/2019 CLINICAL DATA:  Pleural effusion EXAM: PORTABLE CHEST 1 VIEW COMPARISON:  Oct 30, 2019 FINDINGS: The heart size and mediastinal contours ar unchanged with cardiomegaly which is partially obscured. Again noted is a peripherally based rounded airspace opacity in the left upper lung. A large left effusion/consolidation is seen. The right lung is clear. No acute osseous abnormality. IMPRESSION: Rounded airspace opacities/nodule seen within the left upper lung as on recent CT. Large left pleural effusion/consolidation Electronically Signed   By: Prudencio Pair M.D.   On: 11/01/2019 05:42   DG Chest Port 1 View  Result Date:  10/29/2019 CLINICAL DATA:  74 year old male status post thoracentesis. EXAM: PORTABLE CHEST 1 VIEW COMPARISON:  Earlier radiograph dated 10/29/2019. FINDINGS: Interval decrease in the size of the left pleural effusion with moderate residual effusion. There is aerated portion of the left upper lobe. The right lung is clear. No pneumothorax. Stable cardiomediastinal silhouette. No acute osseous pathology. IMPRESSION: Interval decrease in the size of the left pleural effusion with moderate residual effusion. No pneumothorax. Electronically Signed   By: Anner Crete M.D.   On: 10/29/2019 23:34   DG Chest Port 1 View  Result Date: 10/29/2019 CLINICAL DATA:  Dyspnea. Abdominal pain and vomiting while at dialysis today, unable to get complete treatment. EXAM: PORTABLE CHEST 1 VIEW COMPARISON:  Radiograph 10/22/2019 FINDINGS: Progressive left pleural effusion with near complete opacification of the left hemithorax, small portion of left apical lung is aerated. There is rightward mediastinal shift suggesting large fluid volume rather than compressive atelectasis. Mediastinal contours are obscured by left lung opacity. Probable right perihilar atelectasis. No significant right pleural effusion. No pneumothorax. IMPRESSION: Progressive left pleural effusion over the past week with near complete opacification of the left hemithorax. Rightward mediastinal shift suggesting large fluid volume volume rather than passive atelectasis. Electronically Signed   By: Keith Rake M.D.   On: 10/29/2019 20:45   DG Chest Portable 1 View  Result Date: 10/22/2019 CLINICAL DATA:  Productive cough for 4 days with congestion and watery eyes. Current smoker. History of diabetes. EXAM: PORTABLE CHEST 1 VIEW COMPARISON:  Chest radiographs 06/12/2018 and 03/21/2018. FINDINGS: 1141 hours. Dialysis catheter has been removed in the interval. The visualized heart size and mediastinal contours are stable with mild aortic atherosclerosis.  There is a new moderate-sized left pleural effusion with associated left basilar pulmonary opacity. No significant right pleural effusion. No pneumothorax. The pulmonary vasculature is slightly indistinct, and there may be mild pulmonary edema. No significant osseous findings. IMPRESSION: New moderate-sized left pleural effusion with associated left basilar atelectasis or infiltrate. Possible mild pulmonary edema. Electronically Signed   By: Richardean Sale M.D.   On: 10/22/2019 12:18   US THORACENTESIS ASP PLEURAL SPACE W/IMG GUIDE  Result Date: 10/29/2019 INDICATION: 74 year old with shortness  of breath and large left pleural effusion. EXAM: ULTRASOUND GUIDED LEFT THORACENTESIS MEDICATIONS: None. COMPLICATIONS: None immediate. PROCEDURE: An ultrasound guided thoracentesis was thoroughly discussed with the patient and questions answered. The benefits, risks, alternatives and complications were also discussed. The patient understands and wishes to proceed with the procedure. Written consent was obtained. Ultrasound was performed to localize and mark an adequate pocket of fluid in the left chest. The area was then prepped and draped in the normal sterile fashion. 1% Lidocaine was used for local anesthesia. Under ultrasound guidance a 6 Fr Safe-T-Centesis catheter was introduced. Thoracentesis was performed. The catheter was removed and a dressing applied. FINDINGS: A total of approximately 2.35 L of bloody fluid was removed. Samples were sent to the laboratory as requested by the clinical team. IMPRESSION: Successful ultrasound guided left thoracentesis yielding 2.35 L of pleural fluid. Electronically Signed   By: Markus Daft M.D.   On: 10/29/2019 23:26    Labs:  CBC: Recent Labs    10/29/19 1403 10/30/19 0207 10/31/19 0332 11/01/19 0405  WBC 15.0* 12.8* 9.7 9.3  HGB 12.8* 12.2* 11.3* 11.0*  HCT 37.2* 36.7* 34.5* 33.4*  PLT 279 249 245 259    COAGS: Recent Labs    12/16/18 0510 10/29/19 2217   INR 1.1 1.1  APTT  --  27    BMP: Recent Labs    10/29/19 1403 10/30/19 0207 10/31/19 0332 11/01/19 0405  NA 139 142 136 133*  K 3.3* 4.3 4.6 4.9  CL 98 101 97* 95*  CO2 24 29 26 25   GLUCOSE 199* 95 64* 71  BUN 59* 68* 74* 82*  CALCIUM 8.5* 7.2* 7.5* 7.8*  CREATININE 6.95* 8.26* 8.49* 8.64*  GFRNONAA 7* 6* 6* 5*  GFRAA 8* 7* 6* 6*    LIVER FUNCTION TESTS: Recent Labs    12/15/18 1751 10/29/19 1403 10/31/19 0332 11/01/19 0405  BILITOT 0.7 1.0 0.7 0.7  AST 23 17 14* 11*  ALT 18 11 9 11   ALKPHOS 48 49 43 40  PROT 7.1 7.2 6.2* 5.9*  ALBUMIN 3.5 3.3* 2.7* 2.6*    TUMOR MARKERS: No results for input(s): AFPTM, CEA, CA199, CHROMGRNA in the last 8760 hours.  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  Will proceed with placement of a tunneled pleural catheter tomorrow by Dr. Kathlene Cote.  Risks and benefits discussed with the patient including bleeding, infection, damage to adjacent structures, malfunction of the catheter with need for additional procedures.  All of the patient's questions were answered, patient is agreeable to proceed. Consent signed and in chart.  Thank you for this interesting consult.  I greatly enjoyed meeting QUADRE BRISTOL and look forward to participating in their care.  A copy of this report was sent to the requesting provider on this date.  Electronically Signed: Murrell Redden, PA-C   11/01/2019, 12:43 PM      I spent a total of 40 Minutes in face to face in clinical consultation, greater than 50% of which was counseling/coordinating care for tunneled pleural catheter placement.

## 2019-11-02 ENCOUNTER — Other Ambulatory Visit: Payer: Self-pay | Admitting: *Deleted

## 2019-11-02 ENCOUNTER — Other Ambulatory Visit: Payer: Medicare Other

## 2019-11-02 DIAGNOSIS — J9601 Acute respiratory failure with hypoxia: Secondary | ICD-10-CM

## 2019-11-02 DIAGNOSIS — C801 Malignant (primary) neoplasm, unspecified: Secondary | ICD-10-CM

## 2019-11-02 DIAGNOSIS — E1122 Type 2 diabetes mellitus with diabetic chronic kidney disease: Secondary | ICD-10-CM

## 2019-11-02 DIAGNOSIS — N186 End stage renal disease: Secondary | ICD-10-CM

## 2019-11-02 DIAGNOSIS — I5032 Chronic diastolic (congestive) heart failure: Secondary | ICD-10-CM

## 2019-11-02 DIAGNOSIS — R0602 Shortness of breath: Secondary | ICD-10-CM

## 2019-11-02 DIAGNOSIS — Z992 Dependence on renal dialysis: Secondary | ICD-10-CM

## 2019-11-02 DIAGNOSIS — E118 Type 2 diabetes mellitus with unspecified complications: Secondary | ICD-10-CM

## 2019-11-02 DIAGNOSIS — I10 Essential (primary) hypertension: Secondary | ICD-10-CM

## 2019-11-02 LAB — CBC WITH DIFFERENTIAL/PLATELET
Abs Immature Granulocytes: 0.04 10*3/uL (ref 0.00–0.07)
Basophils Absolute: 0.1 10*3/uL (ref 0.0–0.1)
Basophils Relative: 1 %
Eosinophils Absolute: 0.3 10*3/uL (ref 0.0–0.5)
Eosinophils Relative: 4 %
HCT: 34.3 % — ABNORMAL LOW (ref 39.0–52.0)
Hemoglobin: 11.3 g/dL — ABNORMAL LOW (ref 13.0–17.0)
Immature Granulocytes: 1 %
Lymphocytes Relative: 19 %
Lymphs Abs: 1.5 10*3/uL (ref 0.7–4.0)
MCH: 32.7 pg (ref 26.0–34.0)
MCHC: 32.9 g/dL (ref 30.0–36.0)
MCV: 99.1 fL (ref 80.0–100.0)
Monocytes Absolute: 0.9 10*3/uL (ref 0.1–1.0)
Monocytes Relative: 11 %
Neutro Abs: 5.2 10*3/uL (ref 1.7–7.7)
Neutrophils Relative %: 64 %
Platelets: 226 10*3/uL (ref 150–400)
RBC: 3.46 MIL/uL — ABNORMAL LOW (ref 4.22–5.81)
RDW: 14 % (ref 11.5–15.5)
WBC: 8 10*3/uL (ref 4.0–10.5)
nRBC: 0 % (ref 0.0–0.2)

## 2019-11-02 LAB — COMPREHENSIVE METABOLIC PANEL
ALT: 8 U/L (ref 0–44)
AST: 11 U/L — ABNORMAL LOW (ref 15–41)
Albumin: 2.7 g/dL — ABNORMAL LOW (ref 3.5–5.0)
Alkaline Phosphatase: 37 U/L — ABNORMAL LOW (ref 38–126)
Anion gap: 11 (ref 5–15)
BUN: 65 mg/dL — ABNORMAL HIGH (ref 8–23)
CO2: 26 mmol/L (ref 22–32)
Calcium: 7.9 mg/dL — ABNORMAL LOW (ref 8.9–10.3)
Chloride: 97 mmol/L — ABNORMAL LOW (ref 98–111)
Creatinine, Ser: 6.87 mg/dL — ABNORMAL HIGH (ref 0.61–1.24)
GFR calc Af Amer: 8 mL/min — ABNORMAL LOW (ref >60)
GFR calc non Af Amer: 7 mL/min — ABNORMAL LOW (ref >60)
Glucose, Bld: 78 mg/dL (ref 70–99)
Potassium: 4.7 mmol/L (ref 3.5–5.1)
Sodium: 134 mmol/L — ABNORMAL LOW (ref 135–145)
Total Bilirubin: 0.5 mg/dL (ref 0.3–1.2)
Total Protein: 5.9 g/dL — ABNORMAL LOW (ref 6.5–8.1)

## 2019-11-02 LAB — BODY FLUID CULTURE
Culture: NO GROWTH
Gram Stain: NONE SEEN

## 2019-11-02 LAB — GLUCOSE, CAPILLARY
Glucose-Capillary: 89 mg/dL (ref 70–99)
Glucose-Capillary: 75 mg/dL (ref 70–99)
Glucose-Capillary: 69 mg/dL — ABNORMAL LOW (ref 70–99)
Glucose-Capillary: 180 mg/dL — ABNORMAL HIGH (ref 70–99)

## 2019-11-02 LAB — PHOSPHORUS: Phosphorus: 5.2 mg/dL — ABNORMAL HIGH (ref 2.5–4.6)

## 2019-11-02 LAB — MAGNESIUM: Magnesium: 2 mg/dL (ref 1.7–2.4)

## 2019-11-02 LAB — HEPATITIS B SURFACE ANTIBODY, QUANTITATIVE: Hep B S AB Quant (Post): 19.7 m[IU]/mL (ref >9.9)

## 2019-11-02 MED ORDER — VANCOMYCIN HCL IN DEXTROSE 1-5 GM/200ML-% IV SOLN: 1000.0000 mg | Freq: Once | INTRAVENOUS | Status: DC

## 2019-11-02 NOTE — Progress Notes (Signed)
PROGRESS NOTE    Jose [name]  OAC:[id number] DOB: 1946-05-17 DOA: 10/29/2019 PCP: [name], MD  Brief Narrative:  HPI per Dr. Gala [name] on 10/30/19 Jose [name] is a 74 y.o. male with medical history significant of end-stage renal disease on hemodialysis TTS, diabetes, hypertension, prostate cancer, CVA, depression, glaucoma and gout with hyperlipidemia who came to the ER with significant shortness of breath.  Patient's son gave the history that he was at dialysis and started having abdominal pain and then had vomiting.  He did not complete his treatment then.  They took him off early and was brought to the ER.  He was in the hospital recently and discharged on Thursday.  During the hospitalization he was also having significant shortness of breath and cough.  He was treated for productive cough and shortness of breath.  During that time he did have some watery diarrhea.  No fever or chills.  He was noted to have turbid urine suspected to have UTI.  He was treated for pulmonary edema and acute on chronic diastolic heart failure which resolved.  Today however patient was noted to have large left left-sided pleural effusion suspected to be primarily responsible for his symptoms.  He had thoracentesis performed tonight with about 2 L of fluid removed.  He is relatively improved but still short of breath.  Patient being admitted for further treatment in the hospital..  ED Course: Temperature 97.8 blood pressure 130/72 pulse 89 respiratory of 21 oxygen sat 83% on room air currently 95% on 2 L.  White count is 15,000 hemoglobin 12.8 and platelets 279 sodium 139 potassium 3.3 chloride 98.  His CO2 is 24 BUN 59 creatinine 6.95 and calcium 8.5.  Glucose is 199 INR 1.1.  Initial chest x-ray showed progressive left pleural effusion worsening since last week with complete opacification of the left hemithorax.  Subsequent ultrasound-guided thoracentesis performed with about 2 L of fluid  removed.  **Interim History  Awaiting for his Pleurx catheter to be placed today, Jose [name], interventional radiology and palliative care have all been consulted in addition to nephrology.  Patient is to be dialyzed in the morning.  Assessment & Plan:   Principal Problem:   Pleural effusion on left Active Problems:   Type II diabetes mellitus with renal manifestations (HCC)   Hypertension   ESRD on dialysis (HCC)   Tobacco abuse   Acute on chronic diastolic CHF (congestive heart failure) (HCC)   SOB (shortness of breath)   Hypokalemia   Chronic diastolic CHF (congestive heart failure) (HCC)   Benign essential HTN   Malignant neoplasm of unknown origin ([place])   Acute respiratory failure with hypoxia (HCC)   End-stage renal disease on hemodialysis ([place])   Diabetes mellitus type 2, controlled, with complications (HCC)  Chronic Diastolic CHF -C/w Strict I's and O's and Daily Weights; Patient is +1.436 since Admission -Question of weight is accurate as his weight went from 120 pounds to 162 pounds -Currently patient's BP controlled without medication monitor closely -Need to monitor for signs and symptoms of volume overload and he is usually dialyzed for volume maintenance given his ESRD  Essential HTN -Blood pressure is relatively stable and last blood pressure reading was 150/83 -Continue to monitor blood pressures per protocol -Currently not on any antihypertensives  Metastatic lung cancer vs primary lung cancer -Patient has primary versus metastatic cancer in his lung. -CT abdomen pelvis showed masslike thickening of gallbladder see results below.  -CT of the chest showed a spiculated  cavitated mass in the left upper lobe highly suspicious for malignancy with a linear opacity extending off the inferior mass which is likely atelectasis.  There is also a large left pleural effusion extends to the pleural space associated with pleural thickening fluid  Recurrent LEFT-sided pleural  effusion -So far analysis showed transudate. Awaiting full chemistry as well as Gram stain and culture. Suspected all due to anasarca from renal failure. Will need to monitor closely -5/29 LEFT thoracentesis aspirated 2.3 L bloody fluid. -5/30 CT chest showed spiculated cavitated mass in the upper left lobe which is highly suspicious for malignancy.  There is a linear opacity extending off the inferior mass that is likely atelectasis.  The mass extends into the left pleural surface with mild associated pleural thickening fluid.  The large left pleural effusion is likely malignant and he does have adenopathy in the mediastinum worrisome for metastatic disease. -He will need a PET CT scan to further evaluate in the outpatient setting next -5/30 Dr. Sherral [name] spoke with Jose [name] in [facility] stateed fluid was sent off for pathology/cytology to [facility] earliest we would hear back any results is Tuesday. -5/31 discussed case with Dr Jose [name] PCCM who concurs that patient most likely has cancer.  Would like to defer EBUS until results of thoracentesis return. -6/1 PCXR showed "Rounded airspace opacities/nodule seen within the left upper lung as on recent CT. Large left pleural effusion/consolidation" -Today the cytology/pathology report has not returned from thoracentesis conducted on 5/29 -6/1 have requested IR drainage of recurrent LEFT pleural effusion with placement of Pleurx cath.  Explained procedure to patient and he agreed to placement analysis has been done today and still pending -Continue to hold Plavix for now -Pulmonary is consulted for further evaluation and discussed endobronchial ultrasound assisted lymph node biopsies and patient was agreeable -We will hold subcu heparin on 6/2 @0  800 continue to hold Plavix at this time -Continue supplemental oxygen via nasal cannula as below  Acute respiratory failure with Hypoxia -Per patient has had multiple episodes of pleural effusions requiring  draining, never worked up. -See recurrent pleural effusion as above -Continue with supplemental oxygen via nasal cannula and wean O2 as tolerated -SpO2: 100 % O2 Flow Rate (L/min): 3 L/min  -Continuous pulse oximetry and maintain O2 saturations greater than 92% -Repeat chest x-ray in the a.m.  ESRD on HD TTS, Hyperphosphatemia -Per EMR partial session of HD on Thursday.   -BUN/creatinine went from 82/8.61 is now 65/6.7 -Will be dialyzed again in the a.m nephrology feels that he has no acute indication for dialysis today  Anemia of Chronic Kidney Disease -Hold his Plavix for now next-patient's hemoglobin/hematocrit up from 11.0/33.4 is now 11.3/34.3 -Check anemia panel in the a.m.  -Continue to monitor for signs or symptoms of bleeding; currently no overt bleeding noted -CBC in a.m. -Nephrology recommending to hold Epogen at this time due to his likely cancer diagnosis  DM type II controlled with complication -1/51 hemoglobin A1c= 5.6 -Ultrasensitive SSI; CBGs have been on the lower side given that he has been n.p.o. and has CBGs ranging from 69-113  Hyponatremia -Mild with a sodium of 134 next-continue to monitor and trend and repeat CMP in the a.m. -Correct with dialysis  Goals of Care -5/31 Palliative Care Consult;  -Now with neopecmd:[email or web address]?[id number] lasm evaluate patient for change of CODE STATUS, advanced directives, hospice  DVT prophylaxis: SCDs Code Status: FULL CODE Family Communication: No family present at bedside  Disposition Plan:  Status is: Inpatient  Remains inpatient  appropriate because:Ongoing diagnostic testing needed not appropriate for outpatient work up, Unsafe d/c plan and Inpatient level of care appropriate due to severity of illness   Dispo: The patient is from: Home              Anticipated d/c is to: TBD              Anticipated d/c date is: 2 days              Patient currently is not medically stable to d/c.  Consultants:    Nephrology  Pulmonary  Interventional Radiology   Palliative Care Medicine    Procedures:  IR to place Plure-X    Antimicrobials:  Anti-infectives (From admission, onward)   Start     Dose/Rate Route Frequency Ordered Stop   11/02/19 1445  vancomycin (VANCOCIN) IVPB 1000 mg/200 mL premix  Status:  Discontinued     1,000 mg 200 mL/hr over 60 Minutes Intravenous  Once 11/02/19 1438 11/02/19 1440     Subjective: Seen and examined at bedside and states he was hungry. No CP. SOB stable. Felt ok. Denies any lightheadedness or dizziness. Patient resting in bed and had no other concerns or complaints.   Objective: Vitals:   11/01/19 2232 11/02/19 0632 11/02/19 0832 11/02/19 1559  BP: 136/62  (!) 157/84 (!) 150/83  Pulse: 66  74 74  Resp: 16  15 18   Temp: 98.5 F (36.9 C)  98 F (36.7 C) 98.4 F (36.9 C)  TempSrc: Oral  Oral Oral  SpO2: 100%  100% 100%  Weight:  73.5 kg    Height:        Intake/Output Summary (Last 24 hours) at 11/02/2019 1717 Last data filed at 11/02/2019 0130 Gross per 24 hour  Intake 240 ml  Output 150 ml  Net 90 ml   Filed Weights   10/31/19 0643 11/01/19 0642 11/02/19 1610  Weight: 55.4 kg 59 kg 73.5 kg   Examination: Physical Exam:  Constitutional: Thin cachectic and disheveled chronically ill appearing AAM in NAD and appears calm and comfortable Eyes: Lids and conjunctivae normal, sclerae anicteric  ENMT: External Ears, Nose appear normal. Grossly normal hearing. Neck: Appears normal, supple, no cervical masses, normal ROM, no appreciable thyromegaly; no JVD Respiratory: Diminished to auscultation bilaterally with coarse breath sounds and crackles. No appreciable wheezing, rales, rhonchi or crackles. Normal respiratory effort and patient is not tachypenic. No accessory muscle use. Unlabored breathing wearing supplemental O2 via [place] [facility]ovascular: RRR, no murmurs / rubs / gallops. S1 and S2 auscultated. No extremity edema.  Abdomen: Soft,  non-tender, non-distended. Bowel sounds positive.  GU: Deferred. Musculoskeletal: No clubbing / cyanosis of digits/nails.  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 No induration; Warm and dry.  Neurologic: CN 2-12 grossly intact with no focal deficits. Romberg sign and cerebellar reflexes not assessed.  Psychiatric: Normal judgment and insight. Alert and oriented x 3. Normal mood and appropriate affect.   Data Reviewed: I have personally reviewed following labs and imaging studies  CBC: Recent Labs  Lab 10/29/19 1403 10/30/19 0207 10/31/19 0332 11/01/19 0405 11/02/19 0558  WBC 15.0* 12.8* 9.7 9.3 8.0  NEUTROABS  --   --  6.8 6.1 5.2  HGB 12.8* 12.2* 11.3* 11.0* 11.3*  HCT 37.2* 36.7* 34.5* 33.4* 34.3*  MCV 95.6 100.3* 100.9* 99.1 99.1  PLT 279 249 245 259 960   Basic Metabolic Panel: Recent Labs  Lab 10/29/19 1403 10/30/19 0207 10/31/19 0332 11/01/19 0405 11/02/19 0558  NA 139 142 136 133* 134*  K 3.3* 4.3 4.6 4.9 4.7  CL 98 101 97* 95* 97*  CO2 24 29 26 25 26   GLUCOSE 199* 95 64* 71 78  BUN 59* 68* 74* 82* 65*  CREATININE 6.95* 8.26* 8.49* 8.64* 6.87*  CALCIUM 8.5* 7.2* 7.5* 7.8* 7.9*  MG  --   --  2.1 2.1 2.0  PHOS  --   --  5.8* 5.8* 5.2*   GFR: Estimated Creatinine Clearance: 10 mL/min (A) (by C-G formula based on SCr of 6.87 mg/dL (H)). Liver Function Tests: Recent Labs  Lab 10/29/19 1403 10/31/19 0332 11/01/19 0405 11/02/19 0558  AST 17 14* 11* 11*  ALT 11 9 11 8   ALKPHOS 49 43 40 37*  BILITOT 1.0 0.7 0.7 0.5  PROT 7.2 6.2* 5.9* 5.9*  ALBUMIN 3.3* 2.7* 2.6* 2.7*   Recent Labs  Lab 10/29/19 1403  LIPASE 41   No results for input(s): AMMONIA in the last 168 hours. Coagulation Profile: Recent Labs  Lab 10/29/19 2217  INR 1.1   Cardiac Enzymes: No results for input(s): CKTOTAL, CKMB, CKMBINDEX, TROPONINI in the last 168 hours. BNP (last 3  results) No results for input(s): PROBNP in the last 8760 hours. HbA1C: No results for input(s): HGBA1C in the last 72 hours. CBG: Recent Labs  Lab 11/01/19 1628 11/01/19 2129 11/02/19 0833 11/02/19 1135 11/02/19 1600  GLUCAP 111* 113* 89 75 69*   Lipid Profile: No results for input(s): CHOL, HDL, LDLCALC, TRIG, CHOLHDL, LDLDIRECT in the last 72 hours. Thyroid Function Tests: No results for input(s): TSH, T4TOTAL, FREET4, T3FREE, THYROIDAB in the last 72 hours. Anemia Panel: No results for input(s): VITAMINB12, FOLATE, FERRITIN, TIBC, IRON, RETICCTPCT in the last 72 hours. Sepsis Labs: No results for input(s): PROCALCITON, LATICACIDVEN in the last 168 hours.  Recent Results (from the past 240 hour(s))  MRSA PCR Screening     Status: None   Collection Time: 10/23/19  8:13 PM   Specimen: Nasal Mucosa; Nasopharyngeal  Result Value Ref Range Status   MRSA by PCR NEGATIVE NEGATIVE Final    Comment:        The GeneXpert MRSA Assay (FDA approved for NASAL specimens only), is one component of a comprehensive MRSA colonization surveillance program. It is not intended to diagnose MRSA infection nor to guide or monitor treatment for MRSA infections. Performed at [facility], [address] [place], [place] [address]   Body fluid culture     Status: None   Collection Time: 10/29/19 11:52 PM   Specimen: PATH Cytology Pleural fluid  Result Value Ref Range Status   Specimen Description FLUID PLEURAL  Final   Special Requests NONE  Final   Gram Stain NO WBC SEEN NO ORGANISMS SEEN   Final   Culture   Final    NO GROWTH 3 DAYS Performed at [facility], [place] [address] [place], [place] [address]    Report Status 11/02/2019 FINAL  Final     RN Pressure Injury Documentation:     Estimated body mass index is 21.37 kg/m as calculated from the following:   Height as of this encounter: 6\' 1"  (1.854 m).   Weight as of this encounter: 73.5 kg.  Malnutrition  Type:      Malnutrition Characteristics:      Nutrition Interventions:    Radiology Studies: DG Chest Port 1 View  Result Date: 11/01/2019  CLINICAL DATA:  Pleural effusion EXAM: PORTABLE CHEST 1 VIEW COMPARISON:  Oct 30, 2019 FINDINGS: The heart size and mediastinal contours ar unchanged with cardiomegaly which is partially obscured. Again noted is a peripherally based rounded airspace opacity in the left upper lung. A large left effusion/consolidation is seen. The right lung is clear. No acute osseous abnormality. IMPRESSION: Rounded airspace opacities/nodule seen within the left upper lung as on recent CT. Large left pleural effusion/consolidation Electronically Signed   By: Prudencio Pair M.D.   On: 11/01/2019 05:42   Scheduled Meds: . heparin  5,000 Units Subcutaneous Q8H  . insulin aspart  0-5 Units Subcutaneous QHS  . insulin aspart  0-6 Units Subcutaneous TID WC  . nicotine  21 mg Transdermal Daily   Continuous Infusions:   LOS: 4 days   Kerney Elbe, DO [facility] PAGER is on AMION  If 7PM-7AM, please contact night-coverage [email or web address]

## 2019-11-02 NOTE — Progress Notes (Signed)
Pulmonary Medicine          Date: 11/02/2019,   MRN# [id number] Jose [name] 74-28-1947     AdmissionWeight: 54 kg                 CurrentWeight: 73.5 kg   Referring physician: Dr. Sherral Hammers   CHIEF COMPLAINT:   Large left pleural effusion with hilar/mediastinal lymphadenopathy concern for lung cancer   SUBJECTIVE    Patient has not had any overnight events.  Awaiting TPC. Patient reports feeling well.    PAST MEDICAL HISTORY   Past Medical History:  Diagnosis Date  . Chronic kidney disease   . Depression   . Diabetes mellitus without complication ([place])   . Glaucoma   . Glaucoma   . Gout   . Heart murmur   . HLD (hyperlipidemia)   . Hypertension   . Prostate cancer ([place])   . Renal failure    dialysis t/t/s  . Stroke ([place])    tia x 2     SURGICAL HISTORY   Past Surgical History:  Procedure Laterality Date  . AMPUTATION TOE Bilateral 07/23/2018   Procedure: TOE MPJ RIGHT 3RD AND LEFT 2ND;  Surgeon: Samara Deist, DPM;  Location: [facility];  Service: Podiatry;  Laterality: Bilateral;  . APPENDECTOMY    . AV FISTULA PLACEMENT Left 06/09/2018   Procedure: ARTERIOVENOUS (AV) FISTULA CREATION ( BRACHIAL CEPHALIC);  Surgeon: Algernon Huxley, MD;  Location: [facility];  Service: Vascular;  Laterality: Left;  . CHOLECYSTECTOMY    . DIALYSIS/PERMA CATHETER INSERTION N/A 03/24/2018   Procedure: DIALYSIS/PERMA CATHETER INSERTION;  Surgeon: Katha Cabal, MD;  Location: [place] [facility];  Service: Cardiovascular;  Laterality: N/A;  . DIALYSIS/PERMA CATHETER REMOVAL N/A 09/06/2018   Procedure: DIALYSIS/PERMA CATHETER REMOVAL;  Surgeon: Algernon Huxley, MD;  Location: [place] [facility];  Service: Cardiovascular;  Laterality: N/A;  . INSERTION PROSTATE RADIATION SEED    . IRRIGATION AND DEBRIDEMENT FOOT Right 12/17/2018   Procedure: IRRIGATION AND DEBRIDEMENT FOOT;  Surgeon: Samara Deist, DPM;  Location: [facility];  Service: Podiatry;  Laterality: Right;  . LOWER  EXTREMITY ANGIOGRAPHY Right 06/16/2018   Procedure: LOWER EXTREMITY ANGIOGRAPHY;  Surgeon: Algernon Huxley, MD;  Location: [place] [facility];  Service: Cardiovascular;  Laterality: Right;  . LOWER EXTREMITY ANGIOGRAPHY Left 08/30/2018   Procedure: LOWER EXTREMITY ANGIOGRAPHY;  Surgeon: Algernon Huxley, MD;  Location: [place] [facility];  Service: Cardiovascular;  Laterality: Left;  . LOWER EXTREMITY ANGIOGRAPHY Right 09/30/2018   Procedure: LOWER EXTREMITY ANGIOGRAPHY;  Surgeon: Algernon Huxley, MD;  Location: [place] [facility];  Service: Cardiovascular;  Laterality: Right;  . LOWER EXTREMITY ANGIOGRAPHY Left 10/07/2018   Procedure: LOWER EXTREMITY ANGIOGRAPHY;  Surgeon: Algernon Huxley, MD;  Location: [place] [facility];  Service: Cardiovascular;  Laterality: Left;  . LOWER EXTREMITY ANGIOGRAPHY Right 12/20/2018   Procedure: Lower Extremity Angiography;  Surgeon: Algernon Huxley, MD;  Location: [place] [facility];  Service: Cardiovascular;  Laterality: Right;  . TRANSMETATARSAL AMPUTATION Bilateral 10/13/2018   Procedure: 1.  Amputation right great toe MTPJ 2.  Amputation right second toe MTPJ 3.  Amputation right fourth toe MTPJ 4.  Amputation right fifth toe MTPJ 5.  Excision distal second metatarsal head right foot 6.  Excision distal third metatarsal head right foot 7.  Amputation left third toe 8.  Incision and drainage infectionleft 2nd toe amputation site. ;  Surgeon: Samara Deist, DPM;  Location  FAMILY HISTORY   Family History  Problem Relation Age of Onset  . Varicose Veins Neg Hx      SOCIAL HISTORY   Social History   Tobacco Use  . Smoking status: Current Some Day Smoker    Packs/day: 0.10    Types: Cigarettes  . Smokeless tobacco: Never Used  Substance Use Topics  . Alcohol use: Not Currently    Alcohol/week: 0.0 standard drinks  . Drug use: Never     MEDICATIONS    Home Medication:    Current Medication:  Current Facility-Administered Medications:  .  heparin  injection 5,000 Units, 5,000 Units, Subcutaneous, Q8H, Ardis Rowan, PA-C .  insulin aspart (novoLOG) injection 0-5 Units, 0-5 Units, Subcutaneous, QHS, Garba, Mohammad L, MD .  insulin aspart (novoLOG) injection 0-6 Units, 0-6 Units, Subcutaneous, TID WC, Garba, Mohammad L, MD .  nicotine (NICODERM CQ - dosed in mg/24 hours) patch 21 mg, 21 mg, Transdermal, Daily, Allie Bossier, MD, 21 mg at 11/02/19 1004 .  ondansetron (ZOFRAN) tablet 4 mg, 4 mg, Oral, Q6H PRN **OR** ondansetron (ZOFRAN) injection 4 mg, 4 mg, Intravenous, Q6H PRN, Jonelle Sidle, Mohammad L, MD    ALLERGIES   Penicillin g     REVIEW OF SYSTEMS    Review of Systems:  Gen:  Denies  fever, sweats, chills weigh loss  HEENT: Denies blurred vision, double vision, ear pain, eye pain, hearing loss, nose bleeds, sore throat Cardiac:  No dizziness, chest pain or heaviness, chest tightness,edema Resp:   Denies cough or sputum porduction, shortness of breath,wheezing, hemoptysis,  Gi: Denies swallowing difficulty, stomach pain, nausea or vomiting, diarrhea, constipation, bowel incontinence Gu:  Denies bladder incontinence, burning urine Ext:   Denies Joint pain, stiffness or swelling Skin: Denies  skin rash, easy bruising or bleeding or hives Endoc:  Denies polyuria, polydipsia , polyphagia or weight change Psych:   Denies depression, insomnia or hallucinations   Other:  All other systems negative   VS: BP (!) 150/83 (BP Location: Right Arm)   Pulse 74   Temp 98.4 F (36.9 C) (Oral)   Resp 18   Ht 6\' 1"  (1.854 m)   Wt 73.5 kg   SpO2 100%   BMI 21.37 kg/m      PHYSICAL EXAM    GENERAL:NAD, no fevers, chills, no weakness no fatigue HEAD: Normocephalic, atraumatic.  EYES: Pupils equal, round, reactive to light. Extraocular muscles intact. No scleral icterus.  MOUTH: Moist mucosal membrane. Dentition intact. No abscess noted.  EAR, NOSE, THROAT: Clear without exudates. No external lesions.  NECK: Supple. No  thyromegaly. No nodules. No JVD.  PULMONARY: Mild rhonchi bilaterally with decreased breath sounds at the left base CARDIOVASCULAR: S1 and S2. Regular rate and rhythm. No murmurs, rubs, or gallops. No edema. Pedal pulses 2+ bilaterally.  GASTROINTESTINAL: Soft, nontender, nondistended. No masses. Positive bowel sounds. No hepatosplenomegaly.  MUSCULOSKELETAL: No swelling, clubbing, or edema. Range of motion full in all extremities.  NEUROLOGIC: Cranial nerves II through XII are intact. No gross focal neurological deficits. Sensation intact. Reflexes intact.  SKIN: No ulceration, lesions, rashes, or cyanosis. Skin warm and dry. Turgor intact.  PSYCHIATRIC: Mood, affect within normal limits. The patient is awake, alert and oriented x 3. Insight, judgment intact.       IMAGING    CT ABDOMEN PELVIS WO CONTRAST  Result Date: 10/30/2019 CLINICAL DATA:  74 year old male with history of prostate cancer. Left upper lobe mass seen on earlier CT. EXAM: CT ABDOMEN AND PELVIS  WITHOUT CONTRAST TECHNIQUE: Multidetector CT imaging of the abdomen and pelvis was performed following the standard protocol without IV contrast. COMPARISON:  Chest CT dated 10/30/2019. FINDINGS: Evaluation of this exam is limited in the absence of intravenous contrast. Lower chest: Please see report for chest CT of 10/30/2019. No intra-abdominal free air or free fluid. Hepatobiliary: The liver is grossly unremarkable. No intrahepatic biliary ductal dilatation. There is irregular and masslike thickening of the gallbladder wall concerning for primary neoplasm or metastatic disease. Further initial evaluation with ultrasound recommended. MRI may provide better evaluation depending on the ultrasound findings. No calcified gallstone or pericholecystic fluid. Pancreas: Unremarkable. No pancreatic ductal dilatation or surrounding inflammatory changes. Spleen: Normal in size without focal abnormality. Adrenals/Urinary Tract: Indeterminate  bilateral adrenal thickening or hyperplasia. Moderate bilateral renal parenchyma atrophy. There is a 15 mm left renal hypodense lesion which demonstrates fluid attenuation, likely a cyst. Additional smaller hypodense lesions are too small to characterize. There is no hydronephrosis on either side. The visualized ureters appear unremarkable. Excreted contrast from recent CT is noted within the bladder. Stomach/Bowel: There is extensive sigmoid diverticulosis and scattered colonic diverticula without active inflammatory changes. There is no bowel obstruction or active inflammation. The appendix is normal. Vascular/Lymphatic: Advanced aortoiliac atherosclerotic disease. Diffuse ectasia of the abdominal aorta with borderline aneurysmal dilatation of the infrarenal abdominal aorta measuring up to 3 cm in diameter. The IVC is unremarkable. Bilateral common iliac artery stents noted. No portal venous gas. There is no adenopathy. Reproductive: Prostate brachytherapy seeds. Other: Paucity of subcutaneous fat. Musculoskeletal: Degenerative changes of the spine with findings of renal osteodystrophy. No acute osseous pathology. Mild bilateral femoral head avascular necrosis. IMPRESSION: 1. Irregular and masslike thickening of the gallbladder wall concerning for primary neoplasm or metastatic disease. Further initial evaluation with ultrasound recommended. 2. Extensive colonic diverticulosis. No bowel obstruction. Normal appendix. 3. Aortic Atherosclerosis (ICD10-I70.0). 4. A 3 cm infrarenal abdominal aortic aneurysm. Recommend followup by ultrasound in 3 years. This recommendation follows ACR consensus guidelines: White Paper of the ACR Incidental Findings Committee II on Vascular Findings. J Am Coll Radiol 2013; 10:789-794. Aortic aneurysm NOS (ICD10-I71.9) Electronically Signed   By: Anner Crete M.D.   On: 10/30/2019 19:40   CT CHEST W CONTRAST  Result Date: 10/30/2019 CLINICAL DATA:  Left pleural effusion.  Thoracentesis yesterday demonstrated bloody fluid. Malignancy suspected. EXAM: CT CHEST WITH CONTRAST TECHNIQUE: Multidetector CT imaging of the chest was performed during intravenous contrast administration. CONTRAST:  36mL OMNIPAQUE IOHEXOL 300 MG/ML  SOLN COMPARISON:  Recent chest x-rays FINDINGS: Cardiovascular: Atherosclerotic changes are seen in the thoracic aorta. The ascending thoracic aorta measures up to 4.1 cm, mildly aneurysmal. The central pulmonary arteries are normal. Coronary artery calcifications involving the right and left coronary arteries. The heart is unremarkable. Mediastinum/Nodes: There is a mildly prominent node in the base of the neck on the left measuring 9 mm in short axis on series 2, image 14, asymmetric to the right. There is adenopathy with a node measuring 13 mm in short axis in the prevascular space. There is a prominent node in the AP window measuring 15 mm on series 2, image 56. No subcarinal adenopathy. No right-sided adenopathy identified. There is a moderate to large left effusion and a tiny right effusion. No pericardial effusion identified. The thyroid is unremarkable. The esophagus is normal. Lungs/Pleura: There is a spiculated mass in the left upper lobe with central cavitation. Platelike opacity extends off the inferior aspect of the mass, likely associated atelectasis. The mass  extends to the pleural surface with some adjacent pleural fluid and pleural thickening. The mass appears to measure 3.2 by 1.7 by 1.6 cm in craniocaudal, transverse, and AP dimensions. There is a moderate left-sided pleural effusion with underlying opacity, likely atelectasis. No centrally obstructing mass is identified on the left. There is a small right effusion with associated atelectasis. Emphysematous changes are seen in the lungs. Upper Abdomen: Visualized liver and gallbladder are normal. The right kidney is relatively atrophic. The left kidney is not visualized. The left adrenal gland is  mildly prominent as seen on series 2, image 162 but not fully evaluated. No other abnormalities in the upper abdomen. Musculoskeletal: No chest wall abnormality. No acute or significant osseous findings. IMPRESSION: 1. Spiculated cavitated mass in the left upper lobe is above highly suspicious for malignancy. Linear opacity extending off the inferior mass is likely atelectasis. The mass extends to the left pleural surface with mild associated pleural thickening and fluid. Given history, the large left pleural effusion is likely malignant. 2. Adenopathy in the mediastinum worrisome for metastatic disease. A PET-CT could better evaluate. 3. 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 4. Mildly prominent left adrenal gland, incompletely evaluated. Recommend attention on PET-CT. 5. Coronary artery calcifications. 6. Atelectasis in the bases. 7. Emphysematous changes in the lungs. Aortic Atherosclerosis (ICD10-I70.0) and Emphysema (ICD10-J43.9). Electronically Signed   By: Dorise Bullion III M.D   On: 10/30/2019 10:57   US Pelvis Limited  Result Date: 10/22/2019 CLINICAL DATA:  74 year old male with a history of urinary frequency EXAM: LIMITED ULTRASOUND OF PELVIS TECHNIQUE: Limited transabdominal ultrasound examination of the pelvis was performed. COMPARISON:  None. FINDINGS: Urinary bladder measures 440 cc prevoid, 403 cc post void. Prostate volume estimated 15 cc. IMPRESSION: Significant post void residual indicating urinary retention. Prostate volume estimated 15 cc. Electronically Signed   By: Corrie Mckusick D.O.   On: 10/22/2019 12:24   DG Chest Port 1 View  Result Date: 11/01/2019 CLINICAL DATA:  Pleural effusion EXAM: PORTABLE CHEST 1 VIEW COMPARISON:  Oct 30, 2019 FINDINGS: The heart size and mediastinal contours ar unchanged with cardiomegaly which is partially obscured. Again noted is a peripherally based rounded airspace opacity in the left upper lung. A large left  effusion/consolidation is seen. The right lung is clear. No acute osseous abnormality. IMPRESSION: Rounded airspace opacities/nodule seen within the left upper lung as on recent CT. Large left pleural effusion/consolidation Electronically Signed   By: Prudencio Pair M.D.   On: 11/01/2019 05:42   DG Chest Port 1 View  Result Date: 10/29/2019 CLINICAL DATA:  74 year old male status post thoracentesis. EXAM: PORTABLE CHEST 1 VIEW COMPARISON:  Earlier radiograph dated 10/29/2019. FINDINGS: Interval decrease in the size of the left pleural effusion with moderate residual effusion. There is aerated portion of the left upper lobe. The right lung is clear. No pneumothorax. Stable cardiomediastinal silhouette. No acute osseous pathology. IMPRESSION: Interval decrease in the size of the left pleural effusion with moderate residual effusion. No pneumothorax. Electronically Signed   By: Anner Crete M.D.   On: 10/29/2019 23:34   DG Chest Port 1 View  Result Date: 10/29/2019 CLINICAL DATA:  Dyspnea. Abdominal pain and vomiting while at dialysis today, unable to get complete treatment. EXAM: PORTABLE CHEST 1 VIEW COMPARISON:  Radiograph 10/22/2019 FINDINGS: Progressive left pleural effusion with near complete opacification of the left hemithorax, small portion of left apical lung is aerated. There is rightward mediastinal shift suggesting large fluid volume rather  than compressive atelectasis. Mediastinal contours are obscured by left lung opacity. Probable right perihilar atelectasis. No significant right pleural effusion. No pneumothorax. IMPRESSION: Progressive left pleural effusion over the past week with near complete opacification of the left hemithorax. Rightward mediastinal shift suggesting large fluid volume volume rather than passive atelectasis. Electronically Signed   By: Keith Rake M.D.   On: 10/29/2019 20:45   DG Chest Portable 1 View  Result Date: 10/22/2019 CLINICAL DATA:  Productive cough for  4 days with congestion and watery eyes. Current smoker. History of diabetes. EXAM: PORTABLE CHEST 1 VIEW COMPARISON:  Chest radiographs 06/12/2018 and 03/21/2018. FINDINGS: 1141 hours. Dialysis catheter has been removed in the interval. The visualized heart size and mediastinal contours are stable with mild aortic atherosclerosis. There is a new moderate-sized left pleural effusion with associated left basilar pulmonary opacity. No significant right pleural effusion. No pneumothorax. The pulmonary vasculature is slightly indistinct, and there may be mild pulmonary edema. No significant osseous findings. IMPRESSION: New moderate-sized left pleural effusion with associated left basilar atelectasis or infiltrate. Possible mild pulmonary edema. Electronically Signed   By: Richardean Sale M.D.   On: 10/22/2019 12:18   US THORACENTESIS ASP PLEURAL SPACE W/IMG GUIDE  Result Date: 10/29/2019 INDICATION: 74 year old with shortness of breath and large left pleural effusion. EXAM: ULTRASOUND GUIDED LEFT THORACENTESIS MEDICATIONS: None. COMPLICATIONS: None immediate. PROCEDURE: An ultrasound guided thoracentesis was thoroughly discussed with the patient and questions answered. The benefits, risks, alternatives and complications were also discussed. The patient understands and wishes to proceed with the procedure. Written consent was obtained. Ultrasound was performed to localize and mark an adequate pocket of fluid in the left chest. The area was then prepped and draped in the normal sterile fashion. 1% Lidocaine was used for local anesthesia. Under ultrasound guidance a 6 Fr Safe-T-Centesis catheter was introduced. Thoracentesis was performed. The catheter was removed and a dressing applied. FINDINGS: A total of approximately 2.35 L of bloody fluid was removed. Samples were sent to the laboratory as requested by the clinical team. IMPRESSION: Successful ultrasound guided left thoracentesis yielding 2.35 L of pleural fluid.  Electronically Signed   By: Markus Daft M.D.   On: 10/29/2019 23:26      ASSESSMENT/PLAN     Large left pleural effusion with hilar and mediastinal lymphadenopathy as well as left apical spiculated lesion    -Constellation of findings suggestive of possible pulmonary malignancy    -Patient reports chills mild diaphoresis as well as recent weight loss concerning for constitutional symptoms    -High pretest probability for malignancy due to lifelong smoking from age 24 to current active smoking status approximately 1/2 pack daily-for total over 70 pack years smoking history   -Status post thoracentesis-cytology is pending   -Discussed endobronchial ultrasound assisted lymph node biopsies-patient is agreeable   -Currently patient's respiratory status significantly improved post thoracentesis and he may need only outpatient work-up.   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     Thank you for allowing me to participate in the care of this patient.   Patient/Family are satisfied with care plan and all questions have been answered.   This document was prepared using Dragon voice recognition software and may include unintentional dictation errors.     Ottie Glazier, M.D.  Division of [place]

## 2019-11-02 NOTE — Progress Notes (Signed)
[facility]  ROUNDING NOTE   Subjective:  Patient seen and evaluated at bedside. Due for dialysis again tomorrow. Appreciate pulmonology input.  Objective:  Vital signs in last 24 hours:  Temp:  [98 F (36.7 C)-98.8 F (37.1 C)] 98 F (36.7 C) (06/02 0832) Pulse Rate:  [66-74] 74 (06/02 0832) Resp:  [15-18] 15 (06/02 0832) BP: (136-157)/(62-84) 157/84 (06/02 0832) SpO2:  [100 %] 100 % (06/02 0832) Weight:  [73.5 kg] 73.5 kg (06/02 0632)  Weight change: 14.5 kg Filed Weights   10/31/19 0643 11/01/19 0642 11/02/19 5956  Weight: 55.4 kg 59 kg 73.5 kg    Intake/Output: I/O last 3 completed shifts: In: 600 [P.O.:600] Out: 634 [Urine:150; Other:484]   Intake/Output this shift:  No intake/output data recorded.  Physical Exam: General: No acute distress  Head: Normocephalic, atraumatic. Moist oral mucosal membranes  Eyes: Anicteric  Neck: Supple, trachea midline  Lungs:  Slightly diminished at left base  Heart: S1S2 no rubs  Abdomen:  Soft, nontender, bowel sounds present  Extremities: No peripheral edema.  Neurologic: Awake, alert, following commands  Skin: No lesions  Access: LUE AV access    Basic Metabolic Panel: Recent Labs  Lab 10/29/19 1403 10/29/19 1403 10/30/19 0207 10/30/19 0207 10/31/19 0332 11/01/19 0405 11/02/19 0558  NA 139  --  142  --  136 133* 134*  K 3.3*  --  4.3  --  4.6 4.9 4.7  CL 98  --  101  --  97* 95* 97*  CO2 24  --  29  --  26 25 26   GLUCOSE 199*  --  95  --  64* 71 78  BUN 59*  --  68*  --  74* 82* 65*  CREATININE 6.95*  --  8.26*  --  8.49* 8.64* 6.87*  CALCIUM 8.5*   < > 7.2*   < > 7.5* 7.8* 7.9*  MG  --   --   --   --  2.1 2.1 2.0  PHOS  --   --   --   --  5.8* 5.8* 5.2*   < > = values in this interval not displayed.    Liver Function Tests: Recent Labs  Lab 10/29/19 1403 10/31/19 0332 11/01/19 0405 11/02/19 0558  AST 17 14* 11* 11*  ALT 11 9 11 8   ALKPHOS 49 43 40 37*  BILITOT 1.0 0.7 0.7 0.5   PROT 7.2 6.2* 5.9* 5.9*  ALBUMIN 3.3* 2.7* 2.6* 2.7*   Recent Labs  Lab 10/29/19 1403  LIPASE 41   No results for input(s): AMMONIA in the last 168 hours.  CBC: Recent Labs  Lab 10/29/19 1403 10/30/19 0207 10/31/19 0332 11/01/19 0405 11/02/19 0558  WBC 15.0* 12.8* 9.7 9.3 8.0  NEUTROABS  --   --  6.8 6.1 5.2  HGB 12.8* 12.2* 11.3* 11.0* 11.3*  HCT 37.2* 36.7* 34.5* 33.4* 34.3*  MCV 95.6 100.3* 100.9* 99.1 99.1  PLT 279 249 245 259 226    Cardiac Enzymes: No results for input(s): CKTOTAL, CKMB, CKMBINDEX, TROPONINI in the last 168 hours.  BNP: Invalid input(s): POCBNP  CBG: Recent Labs  Lab 11/01/19 1253 11/01/19 1628 11/01/19 2129 11/02/19 0833 11/02/19 1135  GLUCAP 89 111* 113* 89 75    Microbiology: Results for orders placed or performed during the hospital encounter of 10/29/19  Body fluid culture     Status: None   Collection Time: 10/29/19 11:52 PM   Specimen: PATH Cytology Pleural fluid  Result Value  Ref Range Status   Specimen Description FLUID PLEURAL  Final   Special Requests NONE  Final   Gram Stain NO WBC SEEN NO ORGANISMS SEEN   Final   Culture   Final    NO GROWTH 3 DAYS Performed at [facility], [address], [place], [place] [address]    Report Status 11/02/2019 FINAL  Final    Coagulation Studies: No results for input(s): LABPROT, INR in the last 72 hours.  Urinalysis: No results for input(s): COLORURINE, LABSPEC, PHURINE, GLUCOSEU, HGBUR, BILIRUBINUR, KETONESUR, PROTEINUR, UROBILINOGEN, NITRITE, LEUKOCYTESUR in the last 72 hours.  Invalid input(s): APPERANCEUR    Imaging: DG Chest Port 1 View  Result Date: 11/01/2019 CLINICAL DATA:  Pleural effusion EXAM: PORTABLE CHEST 1 VIEW COMPARISON:  Oct 30, 2019 FINDINGS: The heart size and mediastinal contours ar unchanged with cardiomegaly which is partially obscured. Again noted is a peripherally based rounded airspace opacity in the left upper lung. A large left  effusion/consolidation is seen. The right lung is clear. No acute osseous abnormality. IMPRESSION: Rounded airspace opacities/nodule seen within the left upper lung as on recent CT. Large left pleural effusion/consolidation Electronically Signed   By: Prudencio Pair M.D.   On: 11/01/2019 05:42     Medications:    . heparin  5,000 Units Subcutaneous Q8H  . insulin aspart  0-5 Units Subcutaneous QHS  . insulin aspart  0-6 Units Subcutaneous TID WC  . nicotine  21 mg Transdermal Daily   ondansetron **OR** ondansetron (ZOFRAN) IV  Assessment/ Plan:  74 y.o. male with past medical history of ESRD on HD, diabetes mellitus type 2, history of prostate cancer, history of CVA, peripheral vascular disease, bilateral transmetatarsal amputation, anemia of chronic kidney disease, secondary hyperparathyroidism who was admitted with recurrent left pleural effusion.  [facility]/[address] [facility]/LUE AVF  1.  ESRD on HD TTS.  Patient due for dialysis again tomorrow.  No acute indication for dialysis today.  2.  Anemia of chronic kidney disease.  Hold Epogen at this time patient has since cancer diagnosis.  3.  Secondary hyperparathyroidism.  Phosphorus now down to 5.2.  Continue to monitor.  4.  Left pleural effusion/left upper lobe lung mass.  Recurrent in nature.  Underwent thoracentesis 10/29/2019 removing 2.3 L of bloody fluid.  CT chest now reveals spiculated left upper lobe lung mass.  Further work-up per hospitalist/pulmonology.   LOS: 4 Sten Dematteo 6/2/20213:02 PM

## 2019-11-03 ENCOUNTER — Inpatient Hospital Stay: Payer: Medicare Other

## 2019-11-03 HISTORY — PX: IR PERC PLEURAL DRAIN W/INDWELL CATH W/IMG GUIDE: IMG5383

## 2019-11-03 LAB — CBC WITH DIFFERENTIAL/PLATELET
Abs Immature Granulocytes: 0.04 10*3/uL (ref 0.00–0.07)
Basophils Absolute: 0.1 10*3/uL (ref 0.0–0.1)
Basophils Relative: 1 %
Eosinophils Absolute: 0.3 10*3/uL (ref 0.0–0.5)
Eosinophils Relative: 4 %
HCT: 33.5 % — ABNORMAL LOW (ref 39.0–52.0)
Hemoglobin: 11.1 g/dL — ABNORMAL LOW (ref 13.0–17.0)
Immature Granulocytes: 1 %
Lymphocytes Relative: 16 %
Lymphs Abs: 1.4 10*3/uL (ref 0.7–4.0)
MCH: 32.8 pg (ref 26.0–34.0)
MCHC: 33.1 g/dL (ref 30.0–36.0)
MCV: 99.1 fL (ref 80.0–100.0)
Monocytes Absolute: 0.8 10*3/uL (ref 0.1–1.0)
Monocytes Relative: 9 %
Neutro Abs: 5.9 10*3/uL (ref 1.7–7.7)
Neutrophils Relative %: 69 %
Platelets: 245 10*3/uL (ref 150–400)
RBC: 3.38 MIL/uL — ABNORMAL LOW (ref 4.22–5.81)
RDW: 13.8 % (ref 11.5–15.5)
WBC: 8.6 10*3/uL (ref 4.0–10.5)
nRBC: 0 % (ref 0.0–0.2)

## 2019-11-03 LAB — COMPREHENSIVE METABOLIC PANEL
ALT: 9 U/L (ref 0–44)
AST: 13 U/L — ABNORMAL LOW (ref 15–41)
Albumin: 2.6 g/dL — ABNORMAL LOW (ref 3.5–5.0)
Alkaline Phosphatase: 38 U/L (ref 38–126)
Anion gap: 8 (ref 5–15)
BUN: 75 mg/dL — ABNORMAL HIGH (ref 8–23)
CO2: 27 mmol/L (ref 22–32)
Calcium: 8 mg/dL — ABNORMAL LOW (ref 8.9–10.3)
Chloride: 98 mmol/L (ref 98–111)
Creatinine, Ser: 7.28 mg/dL — ABNORMAL HIGH (ref 0.61–1.24)
GFR calc Af Amer: 8 mL/min — ABNORMAL LOW (ref >60)
GFR calc non Af Amer: 7 mL/min — ABNORMAL LOW (ref >60)
Glucose, Bld: 79 mg/dL (ref 70–99)
Potassium: 5.3 mmol/L — ABNORMAL HIGH (ref 3.5–5.1)
Sodium: 133 mmol/L — ABNORMAL LOW (ref 135–145)
Total Bilirubin: 0.5 mg/dL (ref 0.3–1.2)
Total Protein: 6.1 g/dL — ABNORMAL LOW (ref 6.5–8.1)

## 2019-11-03 LAB — GLUCOSE, CAPILLARY
Glucose-Capillary: 72 mg/dL (ref 70–99)
Glucose-Capillary: 68 mg/dL — ABNORMAL LOW (ref 70–99)
Glucose-Capillary: 169 mg/dL — ABNORMAL HIGH (ref 70–99)
Glucose-Capillary: 173 mg/dL — ABNORMAL HIGH (ref 70–99)

## 2019-11-03 LAB — MAGNESIUM: Magnesium: 2.1 mg/dL (ref 1.7–2.4)

## 2019-11-03 LAB — PHOSPHORUS: Phosphorus: 5.4 mg/dL — ABNORMAL HIGH (ref 2.5–4.6)

## 2019-11-03 MED ORDER — TAMSULOSIN HCL 0.4 MG PO CAPS
0.4000 mg | ORAL_CAPSULE | Freq: Every day | ORAL | Status: DC
  Administered 2019-11-04 – 2019-11-05 (×2): 0.4 mg via ORAL
  Filled 2019-11-03 (×2): qty 1

## 2019-11-03 MED ORDER — TRAMADOL HCL 50 MG PO TABS: 50.0000 mg | ORAL_TABLET | Freq: Four times a day (QID) | ORAL | Status: DC | PRN

## 2019-11-03 MED ORDER — CLINDAMYCIN PHOSPHATE 600 MG/50ML IV SOLN
INTRAVENOUS | Status: AC
  Filled 2019-11-03: qty 50

## 2019-11-03 MED ORDER — OXYCODONE HCL 5 MG PO TABS
5.0000 mg | ORAL_TABLET | Freq: Four times a day (QID) | ORAL | Status: DC | PRN
  Administered 2019-11-03 – 2019-11-04 (×3): 5 mg via ORAL
  Filled 2019-11-03 (×3): qty 1

## 2019-11-03 MED ORDER — MIDAZOLAM HCL 2 MG/2ML IJ SOLN
INTRAMUSCULAR | Status: AC | PRN
  Administered 2019-11-03: 0.5 mg via INTRAVENOUS

## 2019-11-03 MED ORDER — CLINDAMYCIN PHOSPHATE 600 MG/50ML IV SOLN
600.0000 mg | INTRAVENOUS | Status: AC
  Filled 2019-11-03: qty 50

## 2019-11-03 MED ORDER — FENTANYL CITRATE (PF) 100 MCG/2ML IJ SOLN
INTRAMUSCULAR | Status: AC | PRN
  Administered 2019-11-03: 25 ug via INTRAVENOUS

## 2019-11-03 MED ORDER — ACETAMINOPHEN 325 MG PO TABS: 650.0000 mg | ORAL_TABLET | Freq: Four times a day (QID) | ORAL | Status: DC | PRN

## 2019-11-03 MED ORDER — ESCITALOPRAM OXALATE 10 MG PO TABS
10.0000 mg | ORAL_TABLET | Freq: Every day | ORAL | Status: DC
  Administered 2019-11-04 – 2019-11-05 (×2): 10 mg via ORAL
  Filled 2019-11-03 (×3): qty 1

## 2019-11-03 MED ORDER — FENTANYL CITRATE (PF) 100 MCG/2ML IJ SOLN
INTRAMUSCULAR | Status: AC
  Filled 2019-11-03: qty 2

## 2019-11-03 MED ORDER — CLINDAMYCIN PHOSPHATE 300 MG/50ML IV SOLN
INTRAVENOUS | Status: AC | PRN
  Administered 2019-11-03: 600 mg via INTRAVENOUS

## 2019-11-03 MED ORDER — MIDAZOLAM HCL 2 MG/2ML IJ SOLN
INTRAMUSCULAR | Status: AC
  Filled 2019-11-03: qty 2

## 2019-11-03 NOTE — Progress Notes (Signed)
Pt back to room from procedure. Pt alert and verbally responsive. Pt VSS, left pleurex dsg remain unremarkable with no active bleeding or drainage noted. cather capped and pt denies any pain, SOB or discomfort. Pt in bed with call light within reach. Will continue to closely monitor. Jose Gaines Nickolis Diel RN   11/03/19 1621  Vitals  Temp 97.7 F (36.5 C)  Temp Source Oral  BP (!) 153/81  MAP (mmHg) 101  BP Location Right Arm  BP Method Automatic  Patient Position (if appropriate) Lying  Pulse Rate 67  Pulse Rate Source Monitor  Resp 20  Oxygen Therapy  SpO2 97 %  O2 Device Room Air  Pain Assessment  Pain Scale 0-10  Pain Score 0  MEWS Score  MEWS Temp 0  MEWS Systolic 0  MEWS Pulse 0  MEWS RR 0  MEWS LOC 0  MEWS Score 0  MEWS Score Color Nyoka Cowden

## 2019-11-03 NOTE — Care Management Important Message (Signed)
Important Message  Patient Details  Name: Jose [name] MRN: [id number] Date of Birth: Apr 02, 1946   Medicare Important Message Given:  Yes     [name] 11/03/2019, 2:00 PM

## 2019-11-03 NOTE — Progress Notes (Signed)
Pulmonary Medicine          Date: 11/03/2019,   MRN# [id number] Jose [name] [birth year]     AdmissionWeight: 54 kg                 CurrentWeight: 74.8 kg   Referring physician: Dr. Sherral Hammers   CHIEF COMPLAINT:   Large left pleural effusion with hilar/mediastinal lymphadenopathy concern for lung cancer   SUBJECTIVE    Patient has not had any overnight events.  S/p TPC . Patient states he feels better.    PAST MEDICAL HISTORY   Past Medical History:  Diagnosis Date  . Chronic kidney disease   . Depression   . Diabetes mellitus without complication ([place])   . Glaucoma   . Glaucoma   . Gout   . Heart murmur   . HLD (hyperlipidemia)   . Hypertension   . Prostate cancer ([place])   . Renal failure    dialysis t/t/s  . Stroke ([place])    tia x 2     SURGICAL HISTORY   Past Surgical History:  Procedure Laterality Date  . AMPUTATION TOE Bilateral 07/23/2018   Procedure: TOE MPJ RIGHT 3RD AND LEFT 2ND;  Surgeon: Samara Deist, DPM;  Location: [facility];  Service: Podiatry;  Laterality: Bilateral;  . APPENDECTOMY    . AV FISTULA PLACEMENT Left 06/09/2018   Procedure: ARTERIOVENOUS (AV) FISTULA CREATION ( BRACHIAL CEPHALIC);  Surgeon: Algernon Huxley, MD;  Location: [facility];  Service: Vascular;  Laterality: Left;  . CHOLECYSTECTOMY    . DIALYSIS/PERMA CATHETER INSERTION N/A 03/24/2018   Procedure: DIALYSIS/PERMA CATHETER INSERTION;  Surgeon: Katha Cabal, MD;  Location: [place] [facility];  Service: Cardiovascular;  Laterality: N/A;  . DIALYSIS/PERMA CATHETER REMOVAL N/A 09/06/2018   Procedure: DIALYSIS/PERMA CATHETER REMOVAL;  Surgeon: Algernon Huxley, MD;  Location: [place] [facility];  Service: Cardiovascular;  Laterality: N/A;  . INSERTION PROSTATE RADIATION SEED    . IR PERC PLEURAL DRAIN W/INDWELL CATH W/IMG GUIDE  11/03/2019  . IRRIGATION AND DEBRIDEMENT FOOT Right 12/17/2018   Procedure: IRRIGATION AND DEBRIDEMENT FOOT;  Surgeon: Samara Deist, DPM;   Location: [facility];  Service: Podiatry;  Laterality: Right;  . LOWER EXTREMITY ANGIOGRAPHY Right 06/16/2018   Procedure: LOWER EXTREMITY ANGIOGRAPHY;  Surgeon: Algernon Huxley, MD;  Location: [place] [facility];  Service: Cardiovascular;  Laterality: Right;  . LOWER EXTREMITY ANGIOGRAPHY Left 08/30/2018   Procedure: LOWER EXTREMITY ANGIOGRAPHY;  Surgeon: Algernon Huxley, MD;  Location: [place] [facility];  Service: Cardiovascular;  Laterality: Left;  . LOWER EXTREMITY ANGIOGRAPHY Right 09/30/2018   Procedure: LOWER EXTREMITY ANGIOGRAPHY;  Surgeon: Algernon Huxley, MD;  Location: [place] [facility];  Service: Cardiovascular;  Laterality: Right;  . LOWER EXTREMITY ANGIOGRAPHY Left 10/07/2018   Procedure: LOWER EXTREMITY ANGIOGRAPHY;  Surgeon: Algernon Huxley, MD;  Location: [place] [facility];  Service: Cardiovascular;  Laterality: Left;  . LOWER EXTREMITY ANGIOGRAPHY Right 12/20/2018   Procedure: Lower Extremity Angiography;  Surgeon: Algernon Huxley, MD;  Location: [place] [facility];  Service: Cardiovascular;  Laterality: Right;  . TRANSMETATARSAL AMPUTATION Bilateral 10/13/2018   Procedure: 1.  Amputation right great toe MTPJ 2.  Amputation right second toe MTPJ 3.  Amputation right fourth toe MTPJ 4.  Amputation right fifth toe MTPJ 5.  Excision distal second metatarsal head right foot 6.  Excision distal third metatarsal head right foot 7.  Amputation left third toe 8.  Incision and  drainage infectionleft 2nd toe amputation site. ;  Surgeon: Samara Deist, DPM;  Location     FAMILY HISTORY   Family History  Problem Relation Age of Onset  . Varicose Veins Neg Hx      SOCIAL HISTORY   Social History   Tobacco Use  . Smoking status: Current Some Day Smoker    Packs/day: 0.10    Types: Cigarettes  . Smokeless tobacco: Never Used  Substance Use Topics  . Alcohol use: Not Currently    Alcohol/week: 0.0 standard drinks  . Drug use: Never     MEDICATIONS    Home Medication:    Current  Medication:  Current Facility-Administered Medications:  .  clindamycin (CLEOCIN) IVPB 600 mg, 600 mg, Intravenous, On Call, Aletta Edouard, MD .  escitalopram Loma Sousa) tablet 10 mg, 10 mg, Oral, Daily, Raiford Noble [place], [place], Stopped at 11/03/19 [phone] .  heparin injection 5,000 Units, 5,000 Units, Subcutaneous, Q8H, Ardis Rowan, PA-C, 5,000 Units at 11/02/19 2241 .  insulin aspart (novoLOG) injection 0-5 Units, 0-5 Units, Subcutaneous, QHS, Garba, Mohammad L, MD .  insulin aspart (novoLOG) injection 0-6 Units, 0-6 Units, Subcutaneous, TID WC, Garba, Mohammad L, MD .  nicotine (NICODERM CQ - dosed in mg/24 hours) patch 21 mg, 21 mg, Transdermal, Daily, Allie Bossier, MD, Stopped at 11/03/19 [phone] .  ondansetron (ZOFRAN) tablet 4 mg, 4 mg, Oral, Q6H PRN **OR** ondansetron (ZOFRAN) injection 4 mg, 4 mg, Intravenous, Q6H PRN, Jonelle Sidle, Mohammad L, MD .  tamsulosin (FLOMAX) capsule 0.4 mg, 0.4 mg, Oral, Daily, Raiford Noble [place], [place], Stopped at 11/03/19 0263    ALLERGIES   Penicillin g     REVIEW OF SYSTEMS    Review of Systems:  Gen:  Denies  fever, sweats, chills weigh loss  HEENT: Denies blurred vision, double vision, ear pain, eye pain, hearing loss, nose bleeds, sore throat Cardiac:  No dizziness, chest pain or heaviness, chest tightness,edema Resp:   Denies cough or sputum porduction, shortness of breath,wheezing, hemoptysis,  Gi: Denies swallowing difficulty, stomach pain, nausea or vomiting, diarrhea, constipation, bowel incontinence Gu:  Denies bladder incontinence, burning urine Ext:   Denies Joint pain, stiffness or swelling Skin: Denies  skin rash, easy bruising or bleeding or hives Endoc:  Denies polyuria, polydipsia , polyphagia or weight change Psych:   Denies depression, insomnia or hallucinations   Other:  All other systems negative   VS: BP (!) 153/81 (BP Location: Right Arm)   Pulse 67   Temp 97.7 F (36.5 C) (Oral)   Resp 20   Ht 6\' 1"  (1.854 m)    Wt 74.8 kg   SpO2 97%   BMI 21.77 kg/m      PHYSICAL EXAM    GENERAL:NAD, no fevers, chills, no weakness no fatigue HEAD: Normocephalic, atraumatic.  EYES: Pupils equal, round, reactive to light. Extraocular muscles intact. No scleral icterus.  MOUTH: Moist mucosal membrane. Dentition intact. No abscess noted.  EAR, NOSE, THROAT: Clear without exudates. No external lesions.  NECK: Supple. No thyromegaly. No nodules. No JVD.  PULMONARY: Mild rhonchi bilaterally with decreased breath sounds at the left base CARDIOVASCULAR: S1 and S2. Regular rate and rhythm. No murmurs, rubs, or gallops. No edema. Pedal pulses 2+ bilaterally.  GASTROINTESTINAL: Soft, nontender, nondistended. No masses. Positive bowel sounds. No hepatosplenomegaly.  MUSCULOSKELETAL: No swelling, clubbing, or edema. Range of motion full in all extremities.  NEUROLOGIC: Cranial nerves II through XII are intact. No gross focal neurological deficits. Sensation intact. Reflexes intact.  SKIN: No ulceration, lesions, rashes, or cyanosis. Skin warm and dry. Turgor intact.  PSYCHIATRIC: Mood, affect within normal limits. The patient is awake, alert and oriented x 3. Insight, judgment intact.       IMAGING    CT ABDOMEN PELVIS WO CONTRAST  Result Date: 10/30/2019 CLINICAL DATA:  74 year old male with history of prostate cancer. Left upper lobe mass seen on earlier CT. EXAM: CT ABDOMEN AND PELVIS WITHOUT CONTRAST TECHNIQUE: Multidetector CT imaging of the abdomen and pelvis was performed following the standard protocol without IV contrast. COMPARISON:  Chest CT dated 10/30/2019. FINDINGS: Evaluation of this exam is limited in the absence of intravenous contrast. Lower chest: Please see report for chest CT of 10/30/2019. No intra-abdominal free air or free fluid. Hepatobiliary: The liver is grossly unremarkable. No intrahepatic biliary ductal dilatation. There is irregular and masslike thickening of the gallbladder wall  concerning for primary neoplasm or metastatic disease. Further initial evaluation with ultrasound recommended. MRI may provide better evaluation depending on the ultrasound findings. No calcified gallstone or pericholecystic fluid. Pancreas: Unremarkable. No pancreatic ductal dilatation or surrounding inflammatory changes. Spleen: Normal in size without focal abnormality. Adrenals/Urinary Tract: Indeterminate bilateral adrenal thickening or hyperplasia. Moderate bilateral renal parenchyma atrophy. There is a 15 mm left renal hypodense lesion which demonstrates fluid attenuation, likely a cyst. Additional smaller hypodense lesions are too small to characterize. There is no hydronephrosis on either side. The visualized ureters appear unremarkable. Excreted contrast from recent CT is noted within the bladder. Stomach/Bowel: There is extensive sigmoid diverticulosis and scattered colonic diverticula without active inflammatory changes. There is no bowel obstruction or active inflammation. The appendix is normal. Vascular/Lymphatic: Advanced aortoiliac atherosclerotic disease. Diffuse ectasia of the abdominal aorta with borderline aneurysmal dilatation of the infrarenal abdominal aorta measuring up to 3 cm in diameter. The IVC is unremarkable. Bilateral common iliac artery stents noted. No portal venous gas. There is no adenopathy. Reproductive: Prostate brachytherapy seeds. Other: Paucity of subcutaneous fat. Musculoskeletal: Degenerative changes of the spine with findings of renal osteodystrophy. No acute osseous pathology. Mild bilateral femoral head avascular necrosis. IMPRESSION: 1. Irregular and masslike thickening of the gallbladder wall concerning for primary neoplasm or metastatic disease. Further initial evaluation with ultrasound recommended. 2. Extensive colonic diverticulosis. No bowel obstruction. Normal appendix. 3. Aortic Atherosclerosis (ICD10-I70.0). 4. A 3 cm infrarenal abdominal aortic aneurysm.  Recommend followup by ultrasound in 3 years. This recommendation follows ACR consensus guidelines: White Paper of the ACR Incidental Findings Committee II on Vascular Findings. J Am Coll Radiol 2013; 10:789-794. Aortic aneurysm NOS (ICD10-I71.9) Electronically Signed   By: Anner Crete M.D.   On: 10/30/2019 19:40   CT CHEST W CONTRAST  Result Date: 10/30/2019 CLINICAL DATA:  Left pleural effusion. Thoracentesis yesterday demonstrated bloody fluid. Malignancy suspected. EXAM: CT CHEST WITH CONTRAST TECHNIQUE: Multidetector CT imaging of the chest was performed during intravenous contrast administration. CONTRAST:  71mL OMNIPAQUE IOHEXOL 300 MG/ML  SOLN COMPARISON:  Recent chest x-rays FINDINGS: Cardiovascular: Atherosclerotic changes are seen in the thoracic aorta. The ascending thoracic aorta measures up to 4.1 cm, mildly aneurysmal. The central pulmonary arteries are normal. Coronary artery calcifications involving the right and left coronary arteries. The heart is unremarkable. Mediastinum/Nodes: There is a mildly prominent node in the base of the neck on the left measuring 9 mm in short axis on series 2, image 14, asymmetric to the right. There is adenopathy with a node measuring 13 mm in short axis in the prevascular space. There is a prominent  node in the AP window measuring 15 mm on series 2, image 56. No subcarinal adenopathy. No right-sided adenopathy identified. There is a moderate to large left effusion and a tiny right effusion. No pericardial effusion identified. The thyroid is unremarkable. The esophagus is normal. Lungs/Pleura: There is a spiculated mass in the left upper lobe with central cavitation. Platelike opacity extends off the inferior aspect of the mass, likely associated atelectasis. The mass extends to the pleural surface with some adjacent pleural fluid and pleural thickening. The mass appears to measure 3.2 by 1.7 by 1.6 cm in craniocaudal, transverse, and AP dimensions. There is a  moderate left-sided pleural effusion with underlying opacity, likely atelectasis. No centrally obstructing mass is identified on the left. There is a small right effusion with associated atelectasis. Emphysematous changes are seen in the lungs. Upper Abdomen: Visualized liver and gallbladder are normal. The right kidney is relatively atrophic. The left kidney is not visualized. The left adrenal gland is mildly prominent as seen on series 2, image 162 but not fully evaluated. No other abnormalities in the upper abdomen. Musculoskeletal: No chest wall abnormality. No acute or significant osseous findings. IMPRESSION: 1. Spiculated cavitated mass in the left upper lobe is above highly suspicious for malignancy. Linear opacity extending off the inferior mass is likely atelectasis. The mass extends to the left pleural surface with mild associated pleural thickening and fluid. Given history, the large left pleural effusion is likely malignant. 2. Adenopathy in the mediastinum worrisome for metastatic disease. A PET-CT could better evaluate. 3. 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 4. Mildly prominent left adrenal gland, incompletely evaluated. Recommend attention on PET-CT. 5. Coronary artery calcifications. 6. Atelectasis in the bases. 7. Emphysematous changes in the lungs. Aortic Atherosclerosis (ICD10-I70.0) and Emphysema (ICD10-J43.9). Electronically Signed   By: Dorise Bullion III M.D   On: 10/30/2019 10:57   US Pelvis Limited  Result Date: 10/22/2019 CLINICAL DATA:  74 year old male with a history of urinary frequency EXAM: LIMITED ULTRASOUND OF PELVIS TECHNIQUE: Limited transabdominal ultrasound examination of the pelvis was performed. COMPARISON:  None. FINDINGS: Urinary bladder measures 440 cc prevoid, 403 cc post void. Prostate volume estimated 15 cc. IMPRESSION: Significant post void residual indicating urinary retention. Prostate volume estimated 15 cc.  Electronically Signed   By: Corrie Mckusick D.O.   On: 10/22/2019 12:24   DG Chest Port 1 View  Result Date: 11/01/2019 CLINICAL DATA:  Pleural effusion EXAM: PORTABLE CHEST 1 VIEW COMPARISON:  Oct 30, 2019 FINDINGS: The heart size and mediastinal contours ar unchanged with cardiomegaly which is partially obscured. Again noted is a peripherally based rounded airspace opacity in the left upper lung. A large left effusion/consolidation is seen. The right lung is clear. No acute osseous abnormality. IMPRESSION: Rounded airspace opacities/nodule seen within the left upper lung as on recent CT. Large left pleural effusion/consolidation Electronically Signed   By: Prudencio Pair M.D.   On: 11/01/2019 05:42   DG Chest Port 1 View  Result Date: 10/29/2019 CLINICAL DATA:  74 year old male status post thoracentesis. EXAM: PORTABLE CHEST 1 VIEW COMPARISON:  Earlier radiograph dated 10/29/2019. FINDINGS: Interval decrease in the size of the left pleural effusion with moderate residual effusion. There is aerated portion of the left upper lobe. The right lung is clear. No pneumothorax. Stable cardiomediastinal silhouette. No acute osseous pathology. IMPRESSION: Interval decrease in the size of the left pleural effusion with moderate residual effusion. No pneumothorax. Electronically Signed   By: Anner Crete  M.D.   On: 10/29/2019 23:34   DG Chest Port 1 View  Result Date: 10/29/2019 CLINICAL DATA:  Dyspnea. Abdominal pain and vomiting while at dialysis today, unable to get complete treatment. EXAM: PORTABLE CHEST 1 VIEW COMPARISON:  Radiograph 10/22/2019 FINDINGS: Progressive left pleural effusion with near complete opacification of the left hemithorax, small portion of left apical lung is aerated. There is rightward mediastinal shift suggesting large fluid volume rather than compressive atelectasis. Mediastinal contours are obscured by left lung opacity. Probable right perihilar atelectasis. No significant right  pleural effusion. No pneumothorax. IMPRESSION: Progressive left pleural effusion over the past week with near complete opacification of the left hemithorax. Rightward mediastinal shift suggesting large fluid volume volume rather than passive atelectasis. Electronically Signed   By: Keith Rake M.D.   On: 10/29/2019 20:45   DG Chest Portable 1 View  Result Date: 10/22/2019 CLINICAL DATA:  Productive cough for 4 days with congestion and watery eyes. Current smoker. History of diabetes. EXAM: PORTABLE CHEST 1 VIEW COMPARISON:  Chest radiographs 06/12/2018 and 03/21/2018. FINDINGS: 1141 hours. Dialysis catheter has been removed in the interval. The visualized heart size and mediastinal contours are stable with mild aortic atherosclerosis. There is a new moderate-sized left pleural effusion with associated left basilar pulmonary opacity. No significant right pleural effusion. No pneumothorax. The pulmonary vasculature is slightly indistinct, and there may be mild pulmonary edema. No significant osseous findings. IMPRESSION: New moderate-sized left pleural effusion with associated left basilar atelectasis or infiltrate. Possible mild pulmonary edema. Electronically Signed   By: Richardean Sale M.D.   On: 10/22/2019 12:18   IR PERC PLEURAL DRAIN W/INDWELL CATH W/IMG GUIDE  Result Date: 11/03/2019 CLINICAL DATA:  Malignant left pleural effusion. Rapid reaccumulation after large volume left thoracentesis on 10/29/2019. Tunneled pleural drainage catheter placement has been requested. EXAM: INSERTION OF TUNNELED PLEURAL DRAINAGE CATHETER ANESTHESIA/SEDATION: 0.5 mg IV Versed; 25 mcg IV Fentanyl. Total Moderate Sedation Time 12 minutes. The patient's level of consciousness and physiologic status were continuously monitored during the procedure by Radiology nursing. MEDICATIONS: 600 mg IV clindamycin. Antibiotic was administered in an appropriate time interval for the procedure. FLUOROSCOPY TIME:  No fluoroscopy was  utilized. PROCEDURE: The procedure, risks, benefits, and alternatives were explained to the patient. Questions regarding the procedure were encouraged and answered. The patient understands and consents to the procedure. A time-out was performed prior to initiating the procedure. The left lateral chest wall was prepped with chlorhexidine in a sterile fashion, and a sterile drape was applied covering the operative field. A sterile gown and sterile gloves were used for the procedure. Local anesthesia was provided with 1% Lidocaine. Ultrasound image documentation was performed. After creating a small skin incision, a 19 gauge needle was advanced into the pleural cavity under ultrasound guidance. A guide wire was then advanced into the pleural space. Pleural access was dilated serially and a 16-French peel-away sheath placed. A 15.5 French tunneled PleurX catheter was placed. This was tunneled from an incision 5 cm below the pleural access to the access site. The catheter was advanced through the peel-away sheath. The sheath was then removed. The access incision was closed with subcuticular 4-0 Monocryl. Dermabond was applied to the incision. A Prolene retention suture was applied at the catheter exit site. Large volume thoracentesis was performed through the new catheter utilizing gravity drainage bag. COMPLICATIONS: None. FINDINGS: The catheter was placed via the left lateral chest wall. Approximately 2.4 liters of bloody pleural fluid was able to be removed after  catheter placement. IMPRESSION: Placement of permanent, tunneled left pleural drainage catheter via lateral approach. 2.4 liters of pleural fluid was removed today after catheter placement. Electronically Signed   By: Aletta Edouard M.D.   On: 11/03/2019 15:35   US THORACENTESIS ASP PLEURAL SPACE W/IMG GUIDE  Result Date: 10/29/2019 INDICATION: 74 year old with shortness of breath and large left pleural effusion. EXAM: ULTRASOUND GUIDED LEFT  THORACENTESIS MEDICATIONS: None. COMPLICATIONS: None immediate. PROCEDURE: An ultrasound guided thoracentesis was thoroughly discussed with the patient and questions answered. The benefits, risks, alternatives and complications were also discussed. The patient understands and wishes to proceed with the procedure. Written consent was obtained. Ultrasound was performed to localize and mark an adequate pocket of fluid in the left chest. The area was then prepped and draped in the normal sterile fashion. 1% Lidocaine was used for local anesthesia. Under ultrasound guidance a 6 Fr Safe-T-Centesis catheter was introduced. Thoracentesis was performed. The catheter was removed and a dressing applied. FINDINGS: A total of approximately 2.35 L of bloody fluid was removed. Samples were sent to the laboratory as requested by the clinical team. IMPRESSION: Successful ultrasound guided left thoracentesis yielding 2.35 L of pleural fluid. Electronically Signed   By: Markus Daft M.D.   On: 10/29/2019 23:26      ASSESSMENT/PLAN     Large left pleural effusion with hilar and mediastinal lymphadenopathy as well as left apical spiculated lesion    -Constellation of findings suggestive of possible pulmonary malignancy    -Patient reports chills mild diaphoresis as well as recent weight loss concerning for constitutional symptoms    -High pretest probability for malignancy due to lifelong smoking from age 38 to current active smoking status approximately 1/2 pack daily-for total over 70 pack years smoking history   -Status post thoracentesis-cytology is pending   -Discussed endobronchial ultrasound assisted lymph node biopsies-patient is agreeable   -Currently patient's respiratory status significantly improved post thoracentesis and he may need only outpatient work-up.   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      Thank you for allowing me to participate in the care of this patient.   Patient/Family are satisfied with care plan and all questions have been answered.   This document was prepared using Dragon voice recognition software and may include unintentional dictation errors.     Ottie Glazier, M.D.  Division of [place]

## 2019-11-03 NOTE — Progress Notes (Signed)
[facility]  ROUNDING NOTE   Subjective:  Patient overall feeling better as compared to admission. Underwent dialysis today. Tolerated well.   Objective:  Vital signs in last 24 hours:  Temp:  [97.6 F (36.4 C)-98.8 F (37.1 C)] 98.1 F (36.7 C) (06/03 1235) Pulse Rate:  [68-80] 73 (06/03 1240) Resp:  [16-18] 18 (06/03 1240) BP: (115-165)/(61-90) 131/82 (06/03 1240) SpO2:  [92 %-100 %] 99 % (06/03 1240)  Weight change:  Filed Weights   10/31/19 0643 11/01/19 0642 11/02/19 4818  Weight: 55.4 kg 59 kg 73.5 kg    Intake/Output: I/O last 3 completed shifts: In: 27 [P.O.:237] Out: 150 [Urine:150]   Intake/Output this shift:  Total I/O In: -  Out: 1500 [Other:1500]  Physical Exam: General: No acute distress  Head: Normocephalic, atraumatic. Moist oral mucosal membranes  Eyes: Anicteric  Neck: Supple, trachea midline  Lungs:  Slightly diminished at left base  Heart: S1S2 no rubs  Abdomen:  Soft, nontender, bowel sounds present  Extremities: No peripheral edema.  Neurologic: Awake, alert, following commands  Skin: No lesions  Access: LUE AV access    Basic Metabolic Panel: Recent Labs  Lab 10/30/19 0207 10/30/19 0207 10/31/19 0332 10/31/19 0332 11/01/19 0405 11/02/19 0558 11/03/19 0606  NA 142  --  136  --  133* 134* 133*  K 4.3  --  4.6  --  4.9 4.7 5.3*  CL 101  --  97*  --  95* 97* 98  CO2 29  --  26  --  25 26 27   GLUCOSE 95  --  64*  --  71 78 79  BUN 68*  --  74*  --  82* 65* 75*  CREATININE 8.26*  --  8.49*  --  8.64* 6.87* 7.28*  CALCIUM 7.2*   < > 7.5*   < > 7.8* 7.9* 8.0*  MG  --   --  2.1  --  2.1 2.0 2.1  PHOS  --   --  5.8*  --  5.8* 5.2* 5.4*   < > = values in this interval not displayed.    Liver Function Tests: Recent Labs  Lab 10/29/19 1403 10/31/19 0332 11/01/19 0405 11/02/19 0558 11/03/19 0606  AST 17 14* 11* 11* 13*  ALT 11 9 11 8 9   ALKPHOS 49 43 40 37* 38  BILITOT 1.0 0.7 0.7 0.5 0.5  PROT 7.2 6.2* 5.9*  5.9* 6.1*  ALBUMIN 3.3* 2.7* 2.6* 2.7* 2.6*   Recent Labs  Lab 10/29/19 1403  LIPASE 41   No results for input(s): AMMONIA in the last 168 hours.  CBC: Recent Labs  Lab 10/30/19 0207 10/31/19 0332 11/01/19 0405 11/02/19 0558 11/03/19 0606  WBC 12.8* 9.7 9.3 8.0 8.6  NEUTROABS  --  6.8 6.1 5.2 5.9  HGB 12.2* 11.3* 11.0* 11.3* 11.1*  HCT 36.7* 34.5* 33.4* 34.3* 33.5*  MCV 100.3* 100.9* 99.1 99.1 99.1  PLT 249 245 259 226 245    Cardiac Enzymes: No results for input(s): CKTOTAL, CKMB, CKMBINDEX, TROPONINI in the last 168 hours.  BNP: Invalid input(s): POCBNP  CBG: Recent Labs  Lab 11/02/19 0833 11/02/19 1135 11/02/19 1600 11/02/19 2130 11/03/19 0822  GLUCAP 89 75 69* 180* 72    Microbiology: Results for orders placed or performed during the hospital encounter of 10/29/19  Body fluid culture     Status: None   Collection Time: 10/29/19 11:52 PM   Specimen: PATH Cytology Pleural fluid  Result Value Ref Range Status  Specimen Description FLUID PLEURAL  Final   Special Requests NONE  Final   Gram Stain NO WBC SEEN NO ORGANISMS SEEN   Final   Culture   Final    NO GROWTH 3 DAYS Performed at [facility], [address] [place], [place] [address]    Report Status 11/02/2019 FINAL  Final    Coagulation Studies: No results for input(s): LABPROT, INR in the last 72 hours.  Urinalysis: No results for input(s): COLORURINE, LABSPEC, PHURINE, GLUCOSEU, HGBUR, BILIRUBINUR, KETONESUR, PROTEINUR, UROBILINOGEN, NITRITE, LEUKOCYTESUR in the last 72 hours.  Invalid input(s): APPERANCEUR    Imaging: No results found.   Medications:   . clindamycin    . clindamycin (CLEOCIN) IV     . escitalopram  10 mg Oral Daily  . heparin  5,000 Units Subcutaneous Q8H  . insulin aspart  0-5 Units Subcutaneous QHS  . insulin aspart  0-6 Units Subcutaneous TID WC  . nicotine  21 mg Transdermal Daily  . tamsulosin  0.4 mg Oral Daily   ondansetron **OR**  ondansetron (ZOFRAN) IV  Assessment/ Plan:  74 y.o. male with past medical history of ESRD on HD, diabetes mellitus type 2, history of prostate cancer, history of CVA, peripheral vascular disease, bilateral transmetatarsal amputation, anemia of chronic kidney disease, secondary hyperparathyroidism who was admitted with recurrent left pleural effusion.  [facility]/[address] [facility]/LUE AVF  1.  ESRD on HD TTS.  Completed dialysis treatment today.  Next Alysis treatment on Saturday still here.  2.  Anemia of chronic kidney disease.  Hold Epogen at this time patient has since cancer diagnosis.  3.  Secondary hyperparathyroidism.  Phosphorus 5.4 and within acceptable range.  4.  Left pleural effusion/left upper lobe lung mass.  Recurrent in nature.  Underwent thoracentesis 10/29/2019 removing 2.3 L of bloody fluid.  CT chest now reveals spiculated left upper lobe lung mass.  Further work-up per hospitalist/pulmonology.   LOS: 5 Iasha Mccalister 6/3/20211:47 PM

## 2019-11-03 NOTE — Progress Notes (Signed)
Pt stable for HD tx vitals stable AVF+/+ UFG 1.5L

## 2019-11-03 NOTE — Procedures (Signed)
Interventional Radiology Procedure Note  Procedure: Tunneled left PleurX catheter placement  Complications: None  Estimated Blood Loss: < 10 mL  Findings: Left tunneled PleurX catheter placed via right mid-axillary approach. Drainage of pleural fluid underway currently.  Venetia Night. Kathlene Cote, M.D Pager:  [phone]

## 2019-11-03 NOTE — OR Nursing (Signed)
Pt arrived into special recovery room 2 post pleurex catheter insertion. Second bottle of bloody fluid disconnected and capped. Primofit external male collection replaced. Pt reports breathing better. Oxygen saturations 98% on 2 liters [place]

## 2019-11-03 NOTE — Progress Notes (Signed)
PROGRESS NOTE    Jose [name]  WNU:[id number] DOB: [birth year] DOA: 10/29/2019 PCP: [name], MD  Brief Narrative:  HPI per Dr. Gala Romney on 10/30/19 Jose [name] is a 74 y.o. male with medical history significant of end-stage renal disease on hemodialysis TTS, diabetes, hypertension, prostate cancer, CVA, depression, glaucoma and gout with hyperlipidemia who came to the ER with significant shortness of breath.  Patient's son gave the history that he was at dialysis and started having abdominal pain and then had vomiting.  He did not complete his treatment then.  They took him off early and was brought to the ER.  He was in the hospital recently and discharged on Thursday.  During the hospitalization he was also having significant shortness of breath and cough.  He was treated for productive cough and shortness of breath.  During that time he did have some watery diarrhea.  No fever or chills.  He was noted to have turbid urine suspected to have UTI.  He was treated for pulmonary edema and acute on chronic diastolic heart failure which resolved.  Today however patient was noted to have large left left-sided pleural effusion suspected to be primarily responsible for his symptoms.  He had thoracentesis performed tonight with about 2 L of fluid removed.  He is relatively improved but still short of breath.  Patient being admitted for further treatment in the hospital..  ED Course: Temperature 97.8 blood pressure 130/72 pulse 89 respiratory of 21 oxygen sat 83% on room air currently 95% on 2 L.  White count is 15,000 hemoglobin 12.8 and platelets 279 sodium 139 potassium 3.3 chloride 98.  His CO2 is 24 BUN 59 creatinine 6.95 and calcium 8.5.  Glucose is 199 INR 1.1.  Initial chest x-ray showed progressive left pleural effusion worsening since last week with complete opacification of the left hemithorax.  Subsequent ultrasound-guided thoracentesis performed with about 2 L of fluid  removed.  **Interim History  He was Awaiting for his Pleurx catheter to be placed however could not be done due to scheduling issues yesterday and is going to be done after Dialysis today. Pulmonary, Interventional radiology and palliative care have all been consulted in addition to nephrology.  Patient is to be dialyzed this AM and next one is scheduled for Saturday. Under went Tunneled Left PleurX Catheter this Afternoon and had it drained and had 2 bottles of bloody fluid. He remains on 2 Liters of Supplemental O2 via [place].   Assessment & Plan:   Principal Problem:   Pleural effusion on left Active Problems:   Type II diabetes mellitus with renal manifestations (HCC)   Hypertension   ESRD on dialysis (HCC)   Tobacco abuse   Acute on chronic diastolic CHF (congestive heart failure) (HCC)   SOB (shortness of breath)   Hypokalemia   Chronic diastolic CHF (congestive heart failure) (HCC)   Benign essential HTN   Malignant neoplasm of unknown origin ([place])   Acute respiratory failure with hypoxia (HCC)   End-stage renal disease on hemodialysis ([place])   Diabetes mellitus type 2, controlled, with complications (HCC)  Chronic Diastolic CHF -C/w Strict I's and O's and Daily Weights; Patient is +173 mL since Admission -Question of weight is accurate as his weight went from 120 pounds to 165 pounds -Currently patient's BP controlled without medication monitor closely -Need to monitor for signs and symptoms of volume overload and he is usually dialyzed for volume maintenance given his ESRD; Underwent Dialysis today   Essential  HTN -Blood pressure is relatively stable and last blood pressure reading was 153/81 -Continue to monitor blood pressures per protocol -Currently not on any antihypertensives  Metastatic lung cancer vs primary lung cancer -Patient has primary versus metastatic cancer in his lung. -CT abdomen pelvis showed masslike thickening of gallbladder; the CT scan showed an  irregular and mass like thickening of the gallbladder wall concerning for primary neoplasm or metastatic disease and recommended initial evaluation with ultrasound and there is also extensive colonic diverticulosis and no bowel obstruction.  There is also aortic atherosclerosis and at 3 cm infrarenal abdominal aortic aneurysm noted on U/S -CT of the chest showed a spiculated cavitated mass in the left upper lobe highly suspicious for malignancy with a linear opacity extending off the inferior mass which is likely atelectasis.  There is also a large left pleural effusion extends to the pleural space associated with pleural thickening fluid -We will obtain an ultrasound of the gallbladder -Pulmonary consulted and they have discussed endobronchial ultrasound assisted lymph node biopsies and patient is agreeable -Will need to Discuss the case with Medical Oncology about further evaluation  Recurrent LEFT-sided pleural effusion -So far analysis showed transudate. Awaiting full chemistry as well as Gram stain and culture. Suspected all due to anasarca from renal failure. Will need to monitor closely -5/29 LEFT thoracentesis aspirated 2.3 L bloody fluid. -5/30 CT chest showed spiculated cavitated mass in the upper left lobe which is highly suspicious for malignancy.  There is a linear opacity extending off the inferior mass that is likely atelectasis.  The mass extends into the left pleural surface with mild associated pleural thickening fluid.  The large left pleural effusion is likely malignant and he does have adenopathy in the mediastinum worrisome for metastatic disease. -He will need a PET CT scan to further evaluate in the outpatient setting next -5/30 Dr. Sherral Hammers spoke with Jose [name] in [facility] stateed fluid was sent off for pathology/cytology to [facility] earliest we would hear back any results is Tuesday. -5/31 discussed case with Dr Ottie Glazier PCCM who concurs that patient most likely has cancer.   Would like to defer EBUS until results of thoracentesis return. -6/1 PCXR showed "Rounded airspace opacities/nodule seen within the left upper lung as on recent CT. Large left pleural effusion/consolidation" -Again the cytology/pathology report has not returned from thoracentesis conducted on 5/29 -6/1 have requested IR drainage of recurrent LEFT pleural effusion with placement of Pleurx cath.  Explained procedure to patient and he agreed to placement analysis has been done today and still pending -Continue to hold Plavix for now but likely can resume now that his PleruX has been placed -Pulmonary is consulted for further evaluation and discussed endobronchial ultrasound assisted lymph node biopsies and patient was agreeable -We will hold subcu heparin on 6/2 @0  800 continue to hold Plavix at this time -Continue supplemental oxygen via nasal cannula as below  Acute Respiratory Failure with Hypoxia -Per patient has had multiple episodes of pleural effusions requiring draining, never worked up. -See recurrent pleural effusion as above -Continue with supplemental oxygen via nasal cannula and wean O2 as tolerated -SpO2: 97 % O2 Flow Rate (L/min): 2 L/min  -Continuous pulse oximetry and maintain O2 saturations greater than 92% -Repeat chest x-ray in the a.m now that [place] is placed;   ESRD on HD TTS, Hyperphosphatemia -Per EMR partial session of HD on Thursday.   -BUN/creatinine went from 82/8.61 -> 65/6.87 -> 75/7.28 -Will be dialyzed again in the a.m nephrology feels that  he has no acute indication for dialysis today  Anemia of Chronic Kidney Disease -Hold his Plavix for now  -Patient's hemoglobin/hematocrit is relatively stable and is now 11.1/33.5 -Check anemia panel in the a.m.  -Continue to monitor for signs or symptoms of bleeding; currently no overt bleeding noted -CBC in a.m. -Nephrology recommending to hold Epogen at this time due to his likely cancer diagnosis  DM type II  controlled with complication -7/16 hemoglobin A1c= 5.6 -Ultrasensitive SSI; CBGs have been on the lower side given that he has been n.p.o. and has CBGs ranging from 69-180  Hyponatremia -Mild with a sodium of 133  -Continue to monitor and trend and repeat CMP in the a.m. -Correct with dialysis  Hyperkalemia -Patient's potassium is now 5.3 -Likely to be corrected in dialysis today -Continue monitor and trend and repeat CMP in the a.m.  Goals of Care -5/31 Palliative Care Consult;  -Appreciate assistance for [place] Discussion  DVT prophylaxis: SCDs Code Status: FULL CODE Family Communication: No family present at bedside  Disposition Plan: We will need to discuss with medical oncology for appropriate follow-up prior to safe discharge disposition.  We will get PT and OT to further evaluate and treat.  Status is: Inpatient  Remains inpatient appropriate because:Ongoing diagnostic testing needed not appropriate for outpatient work up, Unsafe d/c plan and Inpatient level of care appropriate due to severity of illness   Dispo: The patient is from: Home              Anticipated d/c is to: TBD              Anticipated d/c date is: 1 day              Patient currently is not medically stable to d/c.  Consultants:   Nephrology  Pulmonary  Interventional Radiology   Palliative Care Medicine    Procedures:  IR to place Plure-X    Antimicrobials:  Anti-infectives (From admission, onward)   Start     Dose/Rate Route Frequency Ordered Stop   11/03/19 1445  clindamycin (CLEOCIN) IVPB 300 mg     over 30 Minutes Intravenous Continuous PRN 11/03/19 1447 11/03/19 1445   11/03/19 1336  clindamycin (CLEOCIN) 600 MG/50ML IVPB    Note to Pharmacy: Forestine Chute   : cabinet override      11/03/19 1336 11/03/19 1623   11/03/19 1330  clindamycin (CLEOCIN) IVPB 600 mg     600 mg 100 mL/hr over 30 Minutes Intravenous On call 11/03/19 0416 11/04/19 1330   11/02/19 1445  vancomycin (VANCOCIN)  IVPB 1000 mg/200 mL premix  Status:  Discontinued     1,000 mg 200 mL/hr over 60 Minutes Intravenous  Once 11/02/19 1438 11/02/19 1440     Subjective: Seen and examined at bedside and he is getting dialyzed.  He denies any complaints at this time.  States he slept okay.  No nausea or vomiting.  States he felt a little bit hungry.  Objective: Vitals:   11/03/19 1528 11/03/19 1530 11/03/19 1545 11/03/19 1621  BP:  (!) 155/78 (!) 144/76 (!) 153/81  Pulse: 76 73 72 67  Resp: (!) 21 18 16 20   Temp:    97.7 F (36.5 C)  TempSrc:    Oral  SpO2: 97% 98% 95% 97%  Weight:      Height:        Intake/Output Summary (Last 24 hours) at 11/03/2019 1635 Last data filed at 11/03/2019 1235 Gross per 24 hour  Intake 237 ml  Output 1500 ml  Net -1263 ml   Filed Weights   11/01/19 0814 11/02/19 4818 11/03/19 1348  Weight: 59 kg 73.5 kg 74.8 kg   Examination: Physical Exam:  Constitutional: Thin Cachectic and disheveled chronically ill appearing AAM in NAD and appears calm getting dialysis  Eyes: Lids and conjunctivae normal, sclerae anicteric  ENMT: External Ears, Nose appear normal. Grossly normal hearing.  Neck: Appears normal, supple, no cervical masses, normal ROM, no appreciable thyromegaly; no JVD Respiratory: Diminished to auscultation bilaterally, no wheezing, rales, rhonchi or crackles. Normal respiratory effort and patient is not tachypenic. No accessory muscle use. Wearing 2 Liters of Supplemental O2 via [place] Cardiovascular: RRR, no murmurs / rubs / gallops. S1 and S2 auscultated. No extremity edema. Abdomen: Soft, non-tender, non-distended. Bowel sounds positive.  GU: Deferred. Musculoskeletal: No clubbing / cyanosis of digits/nails. Right Transmetatarsal Amputation and has some toes removed from Left foot  Skin: No rashes, lesions, ulcers on a limited skin evaluation. No induration; Warm and dry.  Neurologic: CN 2-12 grossly intact with no focal deficits. Romberg sign and cerebellar  reflexes not assessed.  Psychiatric: Normal judgment and insight. Alert and oriented x 3. Normal mood and appropriate affect.   Data Reviewed: I have personally reviewed following labs and imaging studies  CBC: Recent Labs  Lab 10/30/19 0207 10/31/19 0332 11/01/19 0405 11/02/19 0558 11/03/19 0606  WBC 12.8* 9.7 9.3 8.0 8.6  NEUTROABS  --  6.8 6.1 5.2 5.9  HGB 12.2* 11.3* 11.0* 11.3* 11.1*  HCT 36.7* 34.5* 33.4* 34.3* 33.5*  MCV 100.3* 100.9* 99.1 99.1 99.1  PLT 249 245 259 226 563   Basic Metabolic Panel: Recent Labs  Lab 10/30/19 0207 10/31/19 0332 11/01/19 0405 11/02/19 0558 11/03/19 0606  NA 142 136 133* 134* 133*  K 4.3 4.6 4.9 4.7 5.3*  CL 101 97* 95* 97* 98  CO2 29 26 25 26 27   GLUCOSE 95 64* 71 78 79  BUN 68* 74* 82* 65* 75*  CREATININE 8.26* 8.49* 8.64* 6.87* 7.28*  CALCIUM 7.2* 7.5* 7.8* 7.9* 8.0*  MG  --  2.1 2.1 2.0 2.1  PHOS  --  5.8* 5.8* 5.2* 5.4*   GFR: Estimated Creatinine Clearance: 9.6 mL/min (A) (by C-G formula based on SCr of 7.28 mg/dL (H)). Liver Function Tests: Recent Labs  Lab 10/29/19 1403 10/31/19 0332 11/01/19 0405 11/02/19 0558 11/03/19 0606  AST 17 14* 11* 11* 13*  ALT 11 9 11 8 9   ALKPHOS 49 43 40 37* 38  BILITOT 1.0 0.7 0.7 0.5 0.5  PROT 7.2 6.2* 5.9* 5.9* 6.1*  ALBUMIN 3.3* 2.7* 2.6* 2.7* 2.6*   Recent Labs  Lab 10/29/19 1403  LIPASE 41   No results for input(s): AMMONIA in the last 168 hours. Coagulation Profile: Recent Labs  Lab 10/29/19 2217  INR 1.1   Cardiac Enzymes: No results for input(s): CKTOTAL, CKMB, CKMBINDEX, TROPONINI in the last 168 hours. BNP (last 3 results) No results for input(s): PROBNP in the last 8760 hours. HbA1C: No results for input(s): HGBA1C in the last 72 hours. CBG: Recent Labs  Lab 11/02/19 0833 11/02/19 1135 11/02/19 1600 11/02/19 2130 11/03/19 0822  GLUCAP 89 75 69* 180* 72   Lipid Profile: No results for input(s): CHOL, HDL, LDLCALC, TRIG, CHOLHDL, LDLDIRECT in the  last 72 hours. Thyroid Function Tests: No results for input(s): TSH, T4TOTAL, FREET4, T3FREE, THYROIDAB in the last 72 hours. Anemia Panel: No results for input(s): VITAMINB12, FOLATE, FERRITIN, TIBC, IRON,  RETICCTPCT in the last 72 hours. Sepsis Labs: No results for input(s): PROCALCITON, LATICACIDVEN in the last 168 hours.  Recent Results (from the past 240 hour(s))  Body fluid culture     Status: None   Collection Time: 10/29/19 11:52 PM   Specimen: PATH Cytology Pleural fluid  Result Value Ref Range Status   Specimen Description FLUID PLEURAL  Final   Special Requests NONE  Final   Gram Stain NO WBC SEEN NO ORGANISMS SEEN   Final   Culture   Final    NO GROWTH 3 DAYS Performed at [facility], [address], [place], [place] [address]    Report Status 11/02/2019 FINAL  Final     RN Pressure Injury Documentation:     Estimated body mass index is 21.77 kg/m as calculated from the following:   Height as of this encounter: 6\' 1"  (1.854 m).   Weight as of this encounter: 74.8 kg.  Malnutrition Type:      Malnutrition Characteristics:      Nutrition Interventions:    Radiology Studies: IR PERC PLEURAL DRAIN W/INDWELL CATH W/IMG GUIDE  Result Date: 11/03/2019 CLINICAL DATA:  Malignant left pleural effusion. Rapid reaccumulation after large volume left thoracentesis on 10/29/2019. Tunneled pleural drainage catheter placement has been requested. EXAM: INSERTION OF TUNNELED PLEURAL DRAINAGE CATHETER ANESTHESIA/SEDATION: 0.5 mg IV Versed; 25 mcg IV Fentanyl. Total Moderate Sedation Time 12 minutes. The patient's level of consciousness and physiologic status were continuously monitored during the procedure by Radiology nursing. MEDICATIONS: 600 mg IV clindamycin. Antibiotic was administered in an appropriate time interval for the procedure. FLUOROSCOPY TIME:  No fluoroscopy was utilized. PROCEDURE: The procedure, risks, benefits, and alternatives were explained to the  patient. Questions regarding the procedure were encouraged and answered. The patient understands and consents to the procedure. A time-out was performed prior to initiating the procedure. The left lateral chest wall was prepped with chlorhexidine in a sterile fashion, and a sterile drape was applied covering the operative field. A sterile gown and sterile gloves were used for the procedure. Local anesthesia was provided with 1% Lidocaine. Ultrasound image documentation was performed. After creating a small skin incision, a 19 gauge needle was advanced into the pleural cavity under ultrasound guidance. A guide wire was then advanced into the pleural space. Pleural access was dilated serially and a 16-French peel-away sheath placed. A 15.5 French tunneled PleurX catheter was placed. This was tunneled from an incision 5 cm below the pleural access to the access site. The catheter was advanced through the peel-away sheath. The sheath was then removed. The access incision was closed with subcuticular 4-0 Monocryl. Dermabond was applied to the incision. A Prolene retention suture was applied at the catheter exit site. Large volume thoracentesis was performed through the new catheter utilizing gravity drainage bag. COMPLICATIONS: None. FINDINGS: The catheter was placed via the left lateral chest wall. Approximately 2.4 liters of bloody pleural fluid was able to be removed after catheter placement. IMPRESSION: Placement of permanent, tunneled left pleural drainage catheter via lateral approach. 2.4 liters of pleural fluid was removed today after catheter placement. Electronically Signed   By: Aletta Edouard M.D.   On: 11/03/2019 15:35   Scheduled Meds: . escitalopram  10 mg Oral Daily  . heparin  5,000 Units Subcutaneous Q8H  . insulin aspart  0-5 Units Subcutaneous QHS  . insulin aspart  0-6 Units Subcutaneous TID WC  . nicotine  21 mg Transdermal Daily  . tamsulosin  0.4  mg Oral Daily   Continuous Infusions: .  clindamycin (CLEOCIN) IV       LOS: 5 days   Kerney Elbe, DO [facility] PAGER is on AMION  If 7PM-7AM, please contact night-coverage [email or web address]

## 2019-11-03 NOTE — Progress Notes (Signed)
Pt off unit to procedure. Delia Heady RN

## 2019-11-03 NOTE — Progress Notes (Signed)
Pt stable tolerated HD tx well no issues avf+/+ ufg achieved

## 2019-11-04 ENCOUNTER — Inpatient Hospital Stay: Payer: Medicare Other

## 2019-11-04 ENCOUNTER — Other Ambulatory Visit: Payer: Self-pay | Admitting: Anatomic Pathology & Clinical Pathology

## 2019-11-04 DIAGNOSIS — I5033 Acute on chronic diastolic (congestive) heart failure: Secondary | ICD-10-CM

## 2019-11-04 DIAGNOSIS — C3492 Malignant neoplasm of unspecified part of left bronchus or lung: Secondary | ICD-10-CM

## 2019-11-04 DIAGNOSIS — C3490 Malignant neoplasm of unspecified part of unspecified bronchus or lung: Secondary | ICD-10-CM

## 2019-11-04 DIAGNOSIS — J90 Pleural effusion, not elsewhere classified: Secondary | ICD-10-CM

## 2019-11-04 LAB — CBC WITH DIFFERENTIAL/PLATELET
Abs Immature Granulocytes: 0.03 10*3/uL (ref 0.00–0.07)
Basophils Absolute: 0.1 10*3/uL (ref 0.0–0.1)
Basophils Relative: 1 %
Eosinophils Absolute: 0.2 10*3/uL (ref 0.0–0.5)
Eosinophils Relative: 2 %
HCT: 33.9 % — ABNORMAL LOW (ref 39.0–52.0)
Hemoglobin: 11.5 g/dL — ABNORMAL LOW (ref 13.0–17.0)
Immature Granulocytes: 0 %
Lymphocytes Relative: 15 %
Lymphs Abs: 1.5 10*3/uL (ref 0.7–4.0)
MCH: 33 pg (ref 26.0–34.0)
MCHC: 33.9 g/dL (ref 30.0–36.0)
MCV: 97.4 fL (ref 80.0–100.0)
Monocytes Absolute: 0.9 10*3/uL (ref 0.1–1.0)
Monocytes Relative: 9 %
Neutro Abs: 7.6 10*3/uL (ref 1.7–7.7)
Neutrophils Relative %: 73 %
Platelets: 234 10*3/uL (ref 150–400)
RBC: 3.48 MIL/uL — ABNORMAL LOW (ref 4.22–5.81)
RDW: 13.8 % (ref 11.5–15.5)
WBC: 10.3 10*3/uL (ref 4.0–10.5)
nRBC: 0 % (ref 0.0–0.2)

## 2019-11-04 LAB — COMPREHENSIVE METABOLIC PANEL
ALT: 10 U/L (ref 0–44)
AST: 11 U/L — ABNORMAL LOW (ref 15–41)
Albumin: 2.8 g/dL — ABNORMAL LOW (ref 3.5–5.0)
Alkaline Phosphatase: 42 U/L (ref 38–126)
Anion gap: 13 (ref 5–15)
BUN: 40 mg/dL — ABNORMAL HIGH (ref 8–23)
CO2: 29 mmol/L (ref 22–32)
Calcium: 8.4 mg/dL — ABNORMAL LOW (ref 8.9–10.3)
Chloride: 96 mmol/L — ABNORMAL LOW (ref 98–111)
Creatinine, Ser: 4.8 mg/dL — ABNORMAL HIGH (ref 0.61–1.24)
GFR calc Af Amer: 13 mL/min — ABNORMAL LOW (ref >60)
GFR calc non Af Amer: 11 mL/min — ABNORMAL LOW (ref >60)
Glucose, Bld: 86 mg/dL (ref 70–99)
Potassium: 4.8 mmol/L (ref 3.5–5.1)
Sodium: 138 mmol/L (ref 135–145)
Total Bilirubin: 0.3 mg/dL (ref 0.3–1.2)
Total Protein: 6.4 g/dL — ABNORMAL LOW (ref 6.5–8.1)

## 2019-11-04 LAB — PHOSPHORUS: Phosphorus: 4 mg/dL (ref 2.5–4.6)

## 2019-11-04 LAB — GLUCOSE, CAPILLARY
Glucose-Capillary: 100 mg/dL — ABNORMAL HIGH (ref 70–99)
Glucose-Capillary: 78 mg/dL (ref 70–99)
Glucose-Capillary: 121 mg/dL — ABNORMAL HIGH (ref 70–99)
Glucose-Capillary: 97 mg/dL (ref 70–99)

## 2019-11-04 LAB — MAGNESIUM: Magnesium: 2 mg/dL (ref 1.7–2.4)

## 2019-11-04 LAB — CYTOLOGY - NON PAP

## 2019-11-04 MED ORDER — HEPARIN SODIUM (PORCINE) 5000 UNIT/ML IJ SOLN
5000.0000 [IU] | Freq: Three times a day (TID) | INTRAMUSCULAR | Status: DC
  Administered 2019-11-04 – 2019-11-05 (×3): 5000 [IU] via SUBCUTANEOUS
  Filled 2019-11-04 (×3): qty 1

## 2019-11-04 NOTE — Consult Note (Signed)
Hematology/Oncology Consult note [facility] [phone] [phone]  Patient Care Team: [name], MD as PCP - General (Family Medicine)   Name of the patient: Jose [name]  [id number]  1946/03/13   Date of visit: 11/04/19 REASON FOR COSULTATION:  Lung cancer likely History of presenting illness-  74 y.o. male with PMH listed at below including ESRD on hemodialysis, history of prostate cancer, who presented to emergency room for evaluation of progressively worsening of shortness of breath. Initial chest x-ray and CT chest with contrast showed large amount of pleural effusion on the left side, spiculated cavitated left upper lobe mass highly suspicious for malignancy.  Mass extends to the left pleural surface with mild associated pleural thickening and fluid.  Patient has mediastinal adenopathy.  Mildly prominent left adrenal gland.  Emphysema changes in the lungs. Patient status post left thoracentesis with removal of 2.35 L of bloody pleural fluid.  Patient has a tunneled pleural catheter placed on the left Patient has been seen by pulmonologist Dr. Lanney Gins.  There is plan for endobronchial ultrasound assisted lymph node biopsies and patient is agreeable.  Oncology was consulted to establish care with patient and for further management of likely malignant process. CT scan of the abdomen pelvis showed irregular and masslike thickening of the gallbladder wall concerning for primary neoplasm or metastatic disease.  Further evaluation with ultrasound was recommended.  Extensive colonic diverticulosis.  Aortic atherosclerosis.  Abdominal aortic aneurysm 3 cm Patient has report unintentional weight loss prior to current presentation.  Appetite is fair.  He reports that breathing is much better after the fluid is removed.  Review of Systems  Constitutional: Positive for fatigue and unexpected weight change. Negative for appetite change, chills and  fever.  HENT:   Negative for hearing loss and voice change.   Eyes: Negative for eye problems and icterus.  Respiratory: Positive for shortness of breath. Negative for chest tightness and cough.   Cardiovascular: Negative for chest pain and leg swelling.  Gastrointestinal: Negative for abdominal distention and abdominal pain.  Endocrine: Negative for hot flashes.  Genitourinary: Negative for difficulty urinating, dysuria and frequency.   Musculoskeletal: Negative for arthralgias.  Skin: Negative for itching and rash.  Neurological: Negative for light-headedness and numbness.  Hematological: Negative for adenopathy. Does not bruise/bleed easily.  Psychiatric/Behavioral: Negative for confusion.    Allergies  Allergen Reactions  . Penicillin G Other (See Comments)    Other reaction(s): Unknown DID THE REACTION INVOLVE: Swelling of the face/tongue/throat, SOB, or low BP? Unknown Sudden or severe rash/hives, skin peeling, or the inside of the mouth or nose? Unknown Did it require medical treatment? Unknown When did it last happen?unknown If all above answers are "NO", may proceed with cephalosporin use.     Patient Active Problem List   Diagnosis Date Noted  . Hypokalemia 10/30/2019  . Chronic diastolic CHF (congestive heart failure) ([place]) 10/30/2019  . Benign essential HTN 10/30/2019  . Malignant neoplasm of unknown origin ([place]) 10/30/2019  . Acute respiratory failure with hypoxia ([place]) 10/30/2019  . End-stage renal disease on hemodialysis ([place]) 10/30/2019  . Diabetes mellitus type 2, controlled, with complications ([place]) [id number]/81/0175  . SOB (shortness of breath) 10/29/2019  . Pulmonary edema 10/22/2019  . Diarrhea 10/22/2019  . Acute on chronic diastolic CHF (congestive heart failure) ([place]) 10/22/2019  . HLD (hyperlipidemia)   . Stroke ([place])   . Depression   . Tobacco abuse   . Pleural effusion on left   . Acute urinary  retention   . Foot ulcer ([place]) 12/15/2018  . Ischemic  foot 10/11/2018  . Atherosclerosis of native arteries of the extremities with gangrene ([place]) 07/09/2018  . Gangrene of toe of right foot ([place]) 06/13/2018  . Type II diabetes mellitus with renal manifestations ([place]) 04/16/2018  . Hypertension 04/16/2018  . ESRD on dialysis ([place]) 04/16/2018  . Malnutrition of moderate degree 03/27/2018  . Renal failure 03/21/2018     Past Medical History:  Diagnosis Date  . Chronic kidney disease   . Depression   . Diabetes mellitus without complication ([place])   . Glaucoma   . Glaucoma   . Gout   . Heart murmur   . HLD (hyperlipidemia)   . Hypertension   . Prostate cancer ([place])   . Renal failure    dialysis t/t/s  . Stroke ([place])    tia x 2     Past Surgical History:  Procedure Laterality Date  . AMPUTATION TOE Bilateral 07/23/2018   Procedure: TOE MPJ RIGHT 3RD AND LEFT 2ND;  Surgeon: Samara Deist, DPM;  Location: [facility];  Service: Podiatry;  Laterality: Bilateral;  . APPENDECTOMY    . AV FISTULA PLACEMENT Left 06/09/2018   Procedure: ARTERIOVENOUS (AV) FISTULA CREATION ( BRACHIAL CEPHALIC);  Surgeon: Algernon Huxley, MD;  Location: [facility];  Service: Vascular;  Laterality: Left;  . CHOLECYSTECTOMY    . DIALYSIS/PERMA CATHETER INSERTION N/A 03/24/2018   Procedure: DIALYSIS/PERMA CATHETER INSERTION;  Surgeon: Katha Cabal, MD;  Location: [place] [facility];  Service: Cardiovascular;  Laterality: N/A;  . DIALYSIS/PERMA CATHETER REMOVAL N/A 09/06/2018   Procedure: DIALYSIS/PERMA CATHETER REMOVAL;  Surgeon: Algernon Huxley, MD;  Location: [place] [facility];  Service: Cardiovascular;  Laterality: N/A;  . INSERTION PROSTATE RADIATION SEED    . IR PERC PLEURAL DRAIN W/INDWELL CATH W/IMG GUIDE  11/03/2019  . IRRIGATION AND DEBRIDEMENT FOOT Right 12/17/2018   Procedure: IRRIGATION AND DEBRIDEMENT FOOT;  Surgeon: Samara Deist, DPM;  Location: [facility];  Service: Podiatry;  Laterality: Right;  . LOWER EXTREMITY ANGIOGRAPHY Right 06/16/2018    Procedure: LOWER EXTREMITY ANGIOGRAPHY;  Surgeon: Algernon Huxley, MD;  Location: [place] [facility];  Service: Cardiovascular;  Laterality: Right;  . LOWER EXTREMITY ANGIOGRAPHY Left 08/30/2018   Procedure: LOWER EXTREMITY ANGIOGRAPHY;  Surgeon: Algernon Huxley, MD;  Location: [place] [facility];  Service: Cardiovascular;  Laterality: Left;  . LOWER EXTREMITY ANGIOGRAPHY Right 09/30/2018   Procedure: LOWER EXTREMITY ANGIOGRAPHY;  Surgeon: Algernon Huxley, MD;  Location: [place] [facility];  Service: Cardiovascular;  Laterality: Right;  . LOWER EXTREMITY ANGIOGRAPHY Left 10/07/2018   Procedure: LOWER EXTREMITY ANGIOGRAPHY;  Surgeon: Algernon Huxley, MD;  Location: [place] [facility];  Service: Cardiovascular;  Laterality: Left;  . LOWER EXTREMITY ANGIOGRAPHY Right 12/20/2018   Procedure: Lower Extremity Angiography;  Surgeon: Algernon Huxley, MD;  Location: [place] [facility];  Service: Cardiovascular;  Laterality: Right;  . TRANSMETATARSAL AMPUTATION Bilateral 10/13/2018   Procedure: 1.  Amputation right great toe MTPJ 2.  Amputation right second toe MTPJ 3.  Amputation right fourth toe MTPJ 4.  Amputation right fifth toe MTPJ 5.  Excision distal second metatarsal head right foot 6.  Excision distal third metatarsal head right foot 7.  Amputation left third toe 8.  Incision and drainage infectionleft 2nd toe amputation site. ;  Surgeon: Samara Deist, DPM;  Location    Social History   Socioeconomic History  . Marital status: Widowed    Spouse  name: Not on file  . Number of children: Not on file  . Years of education: Not on file  . Highest education level: Not on file  Occupational History    Employer: RETIRED  Tobacco Use  . Smoking status: Current Some Day Smoker    Packs/day: 0.10    Types: Cigarettes  . Smokeless tobacco: Never Used  Substance and Sexual Activity  . Alcohol use: Not Currently    Alcohol/week: 0.0 standard drinks  . Drug use: Never  . Sexual activity: Not Currently  Other  Topics Concern  . Not on file  Social History Narrative  . Not on file   Social Determinants of Health   Financial Resource Strain:   . Difficulty of Paying Living Expenses:   Food Insecurity:   . Worried About Charity fundraiser in the Last Year:   . Arboriculturist in the Last Year:   Transportation Needs:   . Film/video editor (Medical):   Marland Kitchen Lack of Transportation (Non-Medical):   Physical Activity:   . Days of Exercise per Week:   . Minutes of Exercise per Session:   Stress:   . Feeling of Stress :   Social Connections:   . Frequency of Communication with Friends and Family:   . Frequency of Social Gatherings with Friends and Family:   . Attends Religious Services:   . Active Member of Clubs or Organizations:   . Attends Archivist Meetings:   Marland Kitchen Marital Status:   Intimate Partner Violence:   . Fear of Current or Ex-Partner:   . Emotionally Abused:   Marland Kitchen Physically Abused:   . Sexually Abused:      Family History  Problem Relation Age of Onset  . Varicose Veins Neg Hx      Current Facility-Administered Medications:  .  acetaminophen (TYLENOL) tablet 650 mg, 650 mg, Oral, Q6H PRN, Sharion Settler, NP .  escitalopram (LEXAPRO) tablet 10 mg, 10 mg, Oral, Daily, Sheikh, Omair Latif, DO, 10 mg at 11/04/19 1135 .  heparin injection 5,000 Units, 5,000 Units, Subcutaneous, Q8H, Sheikh, Omair Latif, DO .  insulin aspart (novoLOG) injection 0-5 Units, 0-5 Units, Subcutaneous, QHS, Garba, Mohammad L, MD .  insulin aspart (novoLOG) injection 0-6 Units, 0-6 Units, Subcutaneous, TID WC, Garba, Mohammad L, MD .  nicotine (NICODERM CQ - dosed in mg/24 hours) patch 21 mg, 21 mg, Transdermal, Daily, Allie Bossier, MD, 21 mg at 11/04/19 1135 .  ondansetron (ZOFRAN) tablet 4 mg, 4 mg, Oral, Q6H PRN **OR** ondansetron (ZOFRAN) injection 4 mg, 4 mg, Intravenous, Q6H PRN, Jonelle Sidle, Mohammad L, MD .  oxyCODONE (Oxy IR/ROXICODONE) immediate release tablet 5 mg, 5 mg, Oral, Q6H  PRN, Sharion Settler, NP, 5 mg at 11/04/19 1145 .  tamsulosin (FLOMAX) capsule 0.4 mg, 0.4 mg, Oral, Daily, Sheikh, Omair Latif, DO, 0.4 mg at 11/04/19 1135 .  traMADol (ULTRAM) tablet 50 mg, 50 mg, Oral, Q6H PRN, Sharion Settler, NP   Physical exam:  Vitals:   11/03/19 1545 11/03/19 1621 11/03/19 2346 11/04/19 0826  BP: (!) 144/76 (!) 153/81 132/68 (!) 143/69  Pulse: 72 67 66 70  Resp: 16 20 18 17   Temp:  97.7 F (36.5 C) 98.2 F (36.8 C) 98.4 F (36.9 C)  TempSrc:  Oral    SpO2: 95% 97% 99% 97%  Weight:      Height:       Physical Exam  Constitutional: He is oriented to person, place, and time. No  distress.  HENT:  Head: Normocephalic and atraumatic.  Nose: Nose normal.  Mouth/Throat: Oropharynx is clear and moist. No oropharyngeal exudate.  Eyes: Pupils are equal, round, and reactive to light. EOM are normal. No scleral icterus.  Cardiovascular: Normal rate and regular rhythm.  No murmur heard. Pulmonary/Chest: Effort normal. No respiratory distress.  Decreased breath sound bilaterally, left worse than right  Abdominal: Soft. He exhibits no distension. There is no abdominal tenderness.  Musculoskeletal:        General: No edema. Normal range of motion.     Cervical back: Normal range of motion and neck supple.  Neurological: He is alert and oriented to person, place, and time. No cranial nerve deficit. He exhibits normal muscle tone. Coordination normal.  Skin: Skin is warm and dry. He is not diaphoretic. No erythema.  Psychiatric: Affect normal.        CMP Latest Ref Rng & Units 11/04/2019  Glucose 70 - 99 mg/dL 86  BUN 8 - 23 mg/dL 40(H)  Creatinine 0.61 - 1.24 mg/dL 4.80(H)  Sodium 135 - 145 mmol/L 138  Potassium 3.5 - 5.1 mmol/L 4.8  Chloride 98 - 111 mmol/L 96(L)  CO2 22 - 32 mmol/L 29  Calcium 8.9 - 10.3 mg/dL 8.4(L)  Total Protein 6.5 - 8.1 g/dL 6.4(L)  Total Bilirubin 0.3 - 1.2 mg/dL 0.3  Alkaline Phos 38 - 126 U/L 42  AST 15 - 41 U/L 11(L)  ALT 0 -  44 U/L 10   CBC Latest Ref Rng & Units 11/04/2019  WBC 4.0 - 10.5 K/uL 10.3  Hemoglobin 13.0 - 17.0 g/dL 11.5(L)  Hematocrit 39.0 - 52.0 % 33.9(L)  Platelets 150 - 400 K/uL 234    RADIOGRAPHIC STUDIES: I have personally reviewed the radiological images as listed and agreed with the findings in the report. CT ABDOMEN PELVIS WO CONTRAST  Result Date: 10/30/2019 CLINICAL DATA:  74 year old male with history of prostate cancer. Left upper lobe mass seen on earlier CT. EXAM: CT ABDOMEN AND PELVIS WITHOUT CONTRAST TECHNIQUE: Multidetector CT imaging of the abdomen and pelvis was performed following the standard protocol without IV contrast. COMPARISON:  Chest CT dated 10/30/2019. FINDINGS: Evaluation of this exam is limited in the absence of intravenous contrast. Lower chest: Please see report for chest CT of 10/30/2019. No intra-abdominal free air or free fluid. Hepatobiliary: The liver is grossly unremarkable. No intrahepatic biliary ductal dilatation. There is irregular and masslike thickening of the gallbladder wall concerning for primary neoplasm or metastatic disease. Further initial evaluation with ultrasound recommended. MRI may provide better evaluation depending on the ultrasound findings. No calcified gallstone or pericholecystic fluid. Pancreas: Unremarkable. No pancreatic ductal dilatation or surrounding inflammatory changes. Spleen: Normal in size without focal abnormality. Adrenals/Urinary Tract: Indeterminate bilateral adrenal thickening or hyperplasia. Moderate bilateral renal parenchyma atrophy. There is a 15 mm left renal hypodense lesion which demonstrates fluid attenuation, likely a cyst. Additional smaller hypodense lesions are too small to characterize. There is no hydronephrosis on either side. The visualized ureters appear unremarkable. Excreted contrast from recent CT is noted within the bladder. Stomach/Bowel: There is extensive sigmoid diverticulosis and scattered colonic diverticula  without active inflammatory changes. There is no bowel obstruction or active inflammation. The appendix is normal. Vascular/Lymphatic: Advanced aortoiliac atherosclerotic disease. Diffuse ectasia of the abdominal aorta with borderline aneurysmal dilatation of the infrarenal abdominal aorta measuring up to 3 cm in diameter. The IVC is unremarkable. Bilateral common iliac artery stents noted. No portal venous gas. There is no adenopathy.  Reproductive: Prostate brachytherapy seeds. Other: Paucity of subcutaneous fat. Musculoskeletal: Degenerative changes of the spine with findings of renal osteodystrophy. No acute osseous pathology. Mild bilateral femoral head avascular necrosis. IMPRESSION: 1. Irregular and masslike thickening of the gallbladder wall concerning for primary neoplasm or metastatic disease. Further initial evaluation with ultrasound recommended. 2. Extensive colonic diverticulosis. No bowel obstruction. Normal appendix. 3. Aortic Atherosclerosis (ICD10-I70.0). 4. A 3 cm infrarenal abdominal aortic aneurysm. Recommend followup by ultrasound in 3 years. This recommendation follows ACR consensus guidelines: White Paper of the ACR Incidental Findings Committee II on Vascular Findings. J Am Coll Radiol 2013; 10:789-794. Aortic aneurysm NOS (ICD10-I71.9) Electronically Signed   By: Anner Crete M.D.   On: 10/30/2019 19:40   DG Chest 1 View  Result Date: 11/04/2019 CLINICAL DATA:  Shortness of breath EXAM: CHEST  1 VIEW COMPARISON:  11/01/2019 FINDINGS: Interval placement of left-sided pleural drainage catheter with significant reduction in the size of a left-sided pleural effusion. Streaky opacity within the left lung base, which may reflect atelectasis. Known left upper lobe nodule, better seen on recent CT. The right lung appears clear. Heart size remains mildly enlarged. No pneumothorax. IMPRESSION: Interval placement of left-sided pleural drainage catheter with significant reduction in the size of a  left-sided pleural effusion. No pneumothorax. Electronically Signed   By: Davina Poke D.O.   On: 11/04/2019 08:04   CT CHEST W CONTRAST  Result Date: 10/30/2019 CLINICAL DATA:  Left pleural effusion. Thoracentesis yesterday demonstrated bloody fluid. Malignancy suspected. EXAM: CT CHEST WITH CONTRAST TECHNIQUE: Multidetector CT imaging of the chest was performed during intravenous contrast administration. CONTRAST:  27mL OMNIPAQUE IOHEXOL 300 MG/ML  SOLN COMPARISON:  Recent chest x-rays FINDINGS: Cardiovascular: Atherosclerotic changes are seen in the thoracic aorta. The ascending thoracic aorta measures up to 4.1 cm, mildly aneurysmal. The central pulmonary arteries are normal. Coronary artery calcifications involving the right and left coronary arteries. The heart is unremarkable. Mediastinum/Nodes: There is a mildly prominent node in the base of the neck on the left measuring 9 mm in short axis on series 2, image 14, asymmetric to the right. There is adenopathy with a node measuring 13 mm in short axis in the prevascular space. There is a prominent node in the AP window measuring 15 mm on series 2, image 56. No subcarinal adenopathy. No right-sided adenopathy identified. There is a moderate to large left effusion and a tiny right effusion. No pericardial effusion identified. The thyroid is unremarkable. The esophagus is normal. Lungs/Pleura: There is a spiculated mass in the left upper lobe with central cavitation. Platelike opacity extends off the inferior aspect of the mass, likely associated atelectasis. The mass extends to the pleural surface with some adjacent pleural fluid and pleural thickening. The mass appears to measure 3.2 by 1.7 by 1.6 cm in craniocaudal, transverse, and AP dimensions. There is a moderate left-sided pleural effusion with underlying opacity, likely atelectasis. No centrally obstructing mass is identified on the left. There is a small right effusion with associated atelectasis.  Emphysematous changes are seen in the lungs. Upper Abdomen: Visualized liver and gallbladder are normal. The right kidney is relatively atrophic. The left kidney is not visualized. The left adrenal gland is mildly prominent as seen on series 2, image 162 but not fully evaluated. No other abnormalities in the upper abdomen. Musculoskeletal: No chest wall abnormality. No acute or significant osseous findings. IMPRESSION: 1. Spiculated cavitated mass in the left upper lobe is above highly suspicious for malignancy. Linear opacity extending off  the inferior mass is likely atelectasis. The mass extends to the left pleural surface with mild associated pleural thickening and fluid. Given history, the large left pleural effusion is likely malignant. 2. Adenopathy in the mediastinum worrisome for metastatic disease. A PET-CT could better evaluate. 3. 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 4. Mildly prominent left adrenal gland, incompletely evaluated. Recommend attention on PET-CT. 5. Coronary artery calcifications. 6. Atelectasis in the bases. 7. Emphysematous changes in the lungs. Aortic Atherosclerosis (ICD10-I70.0) and Emphysema (ICD10-J43.9). Electronically Signed   By: Dorise Bullion III M.D   On: 10/30/2019 10:57   US Pelvis Limited  Result Date: 10/22/2019 CLINICAL DATA:  74 year old male with a history of urinary frequency EXAM: LIMITED ULTRASOUND OF PELVIS TECHNIQUE: Limited transabdominal ultrasound examination of the pelvis was performed. COMPARISON:  None. FINDINGS: Urinary bladder measures 440 cc prevoid, 403 cc post void. Prostate volume estimated 15 cc. IMPRESSION: Significant post void residual indicating urinary retention. Prostate volume estimated 15 cc. Electronically Signed   By: Corrie Mckusick D.O.   On: 10/22/2019 12:24   DG Chest Port 1 View  Result Date: 11/01/2019 CLINICAL DATA:  Pleural effusion EXAM: PORTABLE CHEST 1 VIEW COMPARISON:  Oct 30, 2019  FINDINGS: The heart size and mediastinal contours ar unchanged with cardiomegaly which is partially obscured. Again noted is a peripherally based rounded airspace opacity in the left upper lung. A large left effusion/consolidation is seen. The right lung is clear. No acute osseous abnormality. IMPRESSION: Rounded airspace opacities/nodule seen within the left upper lung as on recent CT. Large left pleural effusion/consolidation Electronically Signed   By: Prudencio Pair M.D.   On: 11/01/2019 05:42   DG Chest Port 1 View  Result Date: 10/29/2019 CLINICAL DATA:  74 year old male status post thoracentesis. EXAM: PORTABLE CHEST 1 VIEW COMPARISON:  Earlier radiograph dated 10/29/2019. FINDINGS: Interval decrease in the size of the left pleural effusion with moderate residual effusion. There is aerated portion of the left upper lobe. The right lung is clear. No pneumothorax. Stable cardiomediastinal silhouette. No acute osseous pathology. IMPRESSION: Interval decrease in the size of the left pleural effusion with moderate residual effusion. No pneumothorax. Electronically Signed   By: Anner Crete M.D.   On: 10/29/2019 23:34   DG Chest Port 1 View  Result Date: 10/29/2019 CLINICAL DATA:  Dyspnea. Abdominal pain and vomiting while at dialysis today, unable to get complete treatment. EXAM: PORTABLE CHEST 1 VIEW COMPARISON:  Radiograph 10/22/2019 FINDINGS: Progressive left pleural effusion with near complete opacification of the left hemithorax, small portion of left apical lung is aerated. There is rightward mediastinal shift suggesting large fluid volume rather than compressive atelectasis. Mediastinal contours are obscured by left lung opacity. Probable right perihilar atelectasis. No significant right pleural effusion. No pneumothorax. IMPRESSION: Progressive left pleural effusion over the past week with near complete opacification of the left hemithorax. Rightward mediastinal shift suggesting large fluid  volume volume rather than passive atelectasis. Electronically Signed   By: Keith Rake M.D.   On: 10/29/2019 20:45   DG Chest Portable 1 View  Result Date: 10/22/2019 CLINICAL DATA:  Productive cough for 4 days with congestion and watery eyes. Current smoker. History of diabetes. EXAM: PORTABLE CHEST 1 VIEW COMPARISON:  Chest radiographs 06/12/2018 and 03/21/2018. FINDINGS: 1141 hours. Dialysis catheter has been removed in the interval. The visualized heart size and mediastinal contours are stable with mild aortic atherosclerosis. There is a new moderate-sized left pleural effusion with associated left basilar pulmonary  opacity. No significant right pleural effusion. No pneumothorax. The pulmonary vasculature is slightly indistinct, and there may be mild pulmonary edema. No significant osseous findings. IMPRESSION: New moderate-sized left pleural effusion with associated left basilar atelectasis or infiltrate. Possible mild pulmonary edema. Electronically Signed   By: Richardean Sale M.D.   On: 10/22/2019 12:18   US Abdomen Limited RUQ  Result Date: 11/04/2019 CLINICAL DATA:  Thickened gallbladder wall on CT. EXAM: ULTRASOUND ABDOMEN LIMITED RIGHT UPPER QUADRANT COMPARISON:  CT 10/30/2019. FINDINGS: Gallbladder: Multiple shadowing echo densities in the gallbladder fossa consistent with gallstones in a contracted gallbladder. Gallbladder wall incompletely evaluated due to shadowing from gallstones. Visualized portion of the gallbladder wall measures 2 mm. Negative Murphy sign. Common bile duct: Diameter: 3 mm Liver: No focal lesion identified. Within normal limits in parenchymal echogenicity. Portal vein is patent on color Doppler imaging with normal direction of blood flow towards the liver. Other: None. IMPRESSION: 1. Findings consistent with multiple gallstones in a contracted gallbladder. Gallbladder wall incompletely evaluated due to shadowing from gallstones. 2.  No evidence of biliary distention.   Liver appears normal. Electronically Signed   By: Marcello Moores  Register   On: 11/04/2019 11:44   IR PERC PLEURAL DRAIN W/INDWELL CATH W/IMG GUIDE  Result Date: 11/03/2019 CLINICAL DATA:  Malignant left pleural effusion. Rapid reaccumulation after large volume left thoracentesis on 10/29/2019. Tunneled pleural drainage catheter placement has been requested. EXAM: INSERTION OF TUNNELED PLEURAL DRAINAGE CATHETER ANESTHESIA/SEDATION: 0.5 mg IV Versed; 25 mcg IV Fentanyl. Total Moderate Sedation Time 12 minutes. The patient's level of consciousness and physiologic status were continuously monitored during the procedure by Radiology nursing. MEDICATIONS: 600 mg IV clindamycin. Antibiotic was administered in an appropriate time interval for the procedure. FLUOROSCOPY TIME:  No fluoroscopy was utilized. PROCEDURE: The procedure, risks, benefits, and alternatives were explained to the patient. Questions regarding the procedure were encouraged and answered. The patient understands and consents to the procedure. A time-out was performed prior to initiating the procedure. The left lateral chest wall was prepped with chlorhexidine in a sterile fashion, and a sterile drape was applied covering the operative field. A sterile gown and sterile gloves were used for the procedure. Local anesthesia was provided with 1% Lidocaine. Ultrasound image documentation was performed. After creating a small skin incision, a 19 gauge needle was advanced into the pleural cavity under ultrasound guidance. A guide wire was then advanced into the pleural space. Pleural access was dilated serially and a 16-French peel-away sheath placed. A 15.5 French tunneled PleurX catheter was placed. This was tunneled from an incision 5 cm below the pleural access to the access site. The catheter was advanced through the peel-away sheath. The sheath was then removed. The access incision was closed with subcuticular 4-0 Monocryl. Dermabond was applied to the  incision. A Prolene retention suture was applied at the catheter exit site. Large volume thoracentesis was performed through the new catheter utilizing gravity drainage bag. COMPLICATIONS: None. FINDINGS: The catheter was placed via the left lateral chest wall. Approximately 2.4 liters of bloody pleural fluid was able to be removed after catheter placement. IMPRESSION: Placement of permanent, tunneled left pleural drainage catheter via lateral approach. 2.4 liters of pleural fluid was removed today after catheter placement. Electronically Signed   By: Aletta Edouard M.D.   On: 11/03/2019 15:35   US THORACENTESIS ASP PLEURAL SPACE W/IMG GUIDE  Result Date: 10/29/2019 INDICATION: 74 year old with shortness of breath and large left pleural effusion. EXAM: ULTRASOUND GUIDED LEFT THORACENTESIS MEDICATIONS: None.  COMPLICATIONS: None immediate. PROCEDURE: An ultrasound guided thoracentesis was thoroughly discussed with the patient and questions answered. The benefits, risks, alternatives and complications were also discussed. The patient understands and wishes to proceed with the procedure. Written consent was obtained. Ultrasound was performed to localize and mark an adequate pocket of fluid in the left chest. The area was then prepped and draped in the normal sterile fashion. 1% Lidocaine was used for local anesthesia. Under ultrasound guidance a 6 Fr Safe-T-Centesis catheter was introduced. Thoracentesis was performed. The catheter was removed and a dressing applied. FINDINGS: A total of approximately 2.35 L of bloody fluid was removed. Samples were sent to the laboratory as requested by the clinical team. IMPRESSION: Successful ultrasound guided left thoracentesis yielding 2.35 L of pleural fluid. Electronically Signed   By: Markus Daft M.D.   On: 10/29/2019 23:26    Assessment and plan- Patient is a 74 y.o. male with past medical history prostate cancer, ESRD on hemodialysis presented for evaluation of  shortness of breath.  He was found to have large left pleural effusion status post thoracentesis and status post pleural catheter placement due to likelihood of recurrence of pleural effusion.  #Spiculated cavitated mass in the left upper lobe with large left pleural effusion, pleural thickening Cytology of the fluid showed adenocarcinoma, compatible with lung origin. 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 Discussed with pathology Dr. Ronnald Ramp.  We will send molecular studies Patient will need MRI brain to complete his staging.  #Malignant pleural effusion, status post thoracentesis and placement of pleural catheter. Patient appears to have stable breathing status. On 2 L of oxygen supplementation.  PT/ OT evaluation.  #Irregularity thickening of gallbladder wall on CT scan.  Ultrasound right upper quadrant limited showed multiple gallstones in a contracted gallbladder.  No evidence of biliary distention.  Liver appears normal. I will obtain CEA and CA 19-9. May need to have outpatient PET scan to look at the metabolic activity around the area if gallbladder neoplasm remains suspicious.  Thank you for allowing me to participate in the care of this patient.  Plan was communicated with Dr.Sheikh.  Earlie Server, MD, PhD Hematology Oncology [facility] Pager- [phone] 11/04/2019

## 2019-11-04 NOTE — Progress Notes (Signed)
[facility]  ROUNDING NOTE   Subjective:  Patient seen and evaluated at bedside. Completed dialysis treatment yesterday.   Objective:  Vital signs in last 24 hours:  Temp:  [97.7 F (36.5 C)-98.4 F (36.9 C)] 98.4 F (36.9 C) (06/04 0826) Pulse Rate:  [58-80] 70 (06/04 0826) Resp:  [16-30] 17 (06/04 0826) BP: (131-165)/(68-88) 143/69 (06/04 0826) SpO2:  [92 %-100 %] 97 % (06/04 0826) Weight:  [74.8 kg] 74.8 kg (06/03 1348)  Weight change:  Filed Weights   11/01/19 0642 11/02/19 0632 11/03/19 1348  Weight: 59 kg 73.5 kg 74.8 kg    Intake/Output: I/O last 3 completed shifts: In: 4 [P.O.:237] Out: 1900 [Urine:400; Other:1500]   Intake/Output this shift:  No intake/output data recorded.  Physical Exam: General: No acute distress  Head: Normocephalic, atraumatic. Moist oral mucosal membranes  Eyes: Anicteric  Neck: Supple, trachea midline  Lungs:  Slightly diminished at left base  Heart: S1S2 no rubs  Abdomen:  Soft, nontender, bowel sounds present  Extremities: No peripheral edema.  Neurologic: Awake, alert, following commands  Skin: No lesions  Access: LUE AV access    Basic Metabolic Panel: Recent Labs  Lab 10/31/19 0332 10/31/19 0332 11/01/19 0405 11/01/19 0405 11/02/19 0558 11/03/19 0606 11/04/19 0404  NA 136  --  133*  --  134* 133* 138  K 4.6  --  4.9  --  4.7 5.3* 4.8  CL 97*  --  95*  --  97* 98 96*  CO2 26  --  25  --  26 27 29   GLUCOSE 64*  --  71  --  78 79 86  BUN 74*  --  82*  --  65* 75* 40*  CREATININE 8.49*  --  8.64*  --  6.87* 7.28* 4.80*  CALCIUM 7.5*   < > 7.8*   < > 7.9* 8.0* 8.4*  MG 2.1  --  2.1  --  2.0 2.1 2.0  PHOS 5.8*  --  5.8*  --  5.2* 5.4* 4.0   < > = values in this interval not displayed.    Liver Function Tests: Recent Labs  Lab 10/31/19 0332 11/01/19 0405 11/02/19 0558 11/03/19 0606 11/04/19 0404  AST 14* 11* 11* 13* 11*  ALT 9 11 8 9 10   ALKPHOS 43 40 37* 38 42  BILITOT 0.7 0.7 0.5 0.5 0.3   PROT 6.2* 5.9* 5.9* 6.1* 6.4*  ALBUMIN 2.7* 2.6* 2.7* 2.6* 2.8*   Recent Labs  Lab 10/29/19 1403  LIPASE 41   No results for input(s): AMMONIA in the last 168 hours.  CBC: Recent Labs  Lab 10/31/19 0332 11/01/19 0405 11/02/19 0558 11/03/19 0606 11/04/19 0404  WBC 9.7 9.3 8.0 8.6 10.3  NEUTROABS 6.8 6.1 5.2 5.9 7.6  HGB 11.3* 11.0* 11.3* 11.1* 11.5*  HCT 34.5* 33.4* 34.3* 33.5* 33.9*  MCV 100.9* 99.1 99.1 99.1 97.4  PLT 245 259 226 245 234    Cardiac Enzymes: No results for input(s): CKTOTAL, CKMB, CKMBINDEX, TROPONINI in the last 168 hours.  BNP: Invalid input(s): POCBNP  CBG: Recent Labs  Lab 11/03/19 0822 11/03/19 1624 11/03/19 2200 11/03/19 2214 11/04/19 0827  GLUCAP 72 68* 173* 169* 100*    Microbiology: Results for orders placed or performed during the hospital encounter of 10/29/19  Body fluid culture     Status: None   Collection Time: 10/29/19 11:52 PM   Specimen: PATH Cytology Pleural fluid  Result Value Ref Range Status   Specimen Description  FLUID PLEURAL  Final   Special Requests NONE  Final   Gram Stain NO WBC SEEN NO ORGANISMS SEEN   Final   Culture   Final    NO GROWTH 3 DAYS Performed at [facility], [address], [place], [place] [address]    Report Status 11/02/2019 FINAL  Final    Coagulation Studies: No results for input(s): LABPROT, INR in the last 72 hours.  Urinalysis: No results for input(s): COLORURINE, LABSPEC, PHURINE, GLUCOSEU, HGBUR, BILIRUBINUR, KETONESUR, PROTEINUR, UROBILINOGEN, NITRITE, LEUKOCYTESUR in the last 72 hours.  Invalid input(s): APPERANCEUR    Imaging: DG Chest 1 View  Result Date: 11/04/2019 CLINICAL DATA:  Shortness of breath EXAM: CHEST  1 VIEW COMPARISON:  11/01/2019 FINDINGS: Interval placement of left-sided pleural drainage catheter with significant reduction in the size of a left-sided pleural effusion. Streaky opacity within the left lung base, which may reflect atelectasis. Known  left upper lobe nodule, better seen on recent CT. The right lung appears clear. Heart size remains mildly enlarged. No pneumothorax. IMPRESSION: Interval placement of left-sided pleural drainage catheter with significant reduction in the size of a left-sided pleural effusion. No pneumothorax. Electronically Signed   By: Davina Poke D.O.   On: 11/04/2019 08:04   IR PERC PLEURAL DRAIN W/INDWELL CATH W/IMG GUIDE  Result Date: 11/03/2019 CLINICAL DATA:  Malignant left pleural effusion. Rapid reaccumulation after large volume left thoracentesis on 10/29/2019. Tunneled pleural drainage catheter placement has been requested. EXAM: INSERTION OF TUNNELED PLEURAL DRAINAGE CATHETER ANESTHESIA/SEDATION: 0.5 mg IV Versed; 25 mcg IV Fentanyl. Total Moderate Sedation Time 12 minutes. The patient's level of consciousness and physiologic status were continuously monitored during the procedure by Radiology nursing. MEDICATIONS: 600 mg IV clindamycin. Antibiotic was administered in an appropriate time interval for the procedure. FLUOROSCOPY TIME:  No fluoroscopy was utilized. PROCEDURE: The procedure, risks, benefits, and alternatives were explained to the patient. Questions regarding the procedure were encouraged and answered. The patient understands and consents to the procedure. A time-out was performed prior to initiating the procedure. The left lateral chest wall was prepped with chlorhexidine in a sterile fashion, and a sterile drape was applied covering the operative field. A sterile gown and sterile gloves were used for the procedure. Local anesthesia was provided with 1% Lidocaine. Ultrasound image documentation was performed. After creating a small skin incision, a 19 gauge needle was advanced into the pleural cavity under ultrasound guidance. A guide wire was then advanced into the pleural space. Pleural access was dilated serially and a 16-French peel-away sheath placed. A 15.5 French tunneled PleurX catheter was  placed. This was tunneled from an incision 5 cm below the pleural access to the access site. The catheter was advanced through the peel-away sheath. The sheath was then removed. The access incision was closed with subcuticular 4-0 Monocryl. Dermabond was applied to the incision. A Prolene retention suture was applied at the catheter exit site. Large volume thoracentesis was performed through the new catheter utilizing gravity drainage bag. COMPLICATIONS: None. FINDINGS: The catheter was placed via the left lateral chest wall. Approximately 2.4 liters of bloody pleural fluid was able to be removed after catheter placement. IMPRESSION: Placement of permanent, tunneled left pleural drainage catheter via lateral approach. 2.4 liters of pleural fluid was removed today after catheter placement. Electronically Signed   By: Aletta Edouard M.D.   On: 11/03/2019 15:35     Medications:    . escitalopram  10 mg Oral Daily  . insulin aspart  0-5 Units Subcutaneous QHS  . insulin aspart  0-6 Units Subcutaneous TID WC  . nicotine  21 mg Transdermal Daily  . tamsulosin  0.4 mg Oral Daily   acetaminophen, ondansetron **OR** ondansetron (ZOFRAN) IV, oxyCODONE, traMADol  Assessment/ Plan:  74 y.o. male with past medical history of ESRD on HD, diabetes mellitus type 2, history of prostate cancer, history of CVA, peripheral vascular disease, bilateral transmetatarsal amputation, anemia of chronic kidney disease, secondary hyperparathyroidism who was admitted with recurrent left pleural effusion.  [facility]/[address] [facility]/LUE AVF  1.  ESRD on HD TTS.  Patient underwent dialysis yesterday.  No acute indication for dialysis treatment today.  Next Alysis treatment tomorrow if still here.  2.  Anemia of chronic kidney disease.  Hold Epogen at this time patient has since cancer diagnosis.  3.  Secondary hyperparathyroidism.  Phosphorus at target of 4.0.  Continue to monitor.  4.  Left pleural effusion/left upper  lobe lung mass.  Recurrent in nature.  Underwent thoracentesis 10/29/2019 removing 2.3 L of bloody fluid.  CT chest now reveals spiculated left upper lobe lung mass.  Timing of biopsy for hospitalist and pulmonology.   LOS: 6 Christell Steinmiller 6/4/202111:11 AM

## 2019-11-04 NOTE — Progress Notes (Signed)
Pulmonary Medicine          Date: 11/04/2019,   MRN# [id number] Jose [name] Oct 10, 1945     AdmissionWeight: 54 kg                 CurrentWeight: 74.8 kg   Referring physician: Dr. Sherral Hammers   CHIEF COMPLAINT:   Large left pleural effusion with hilar/mediastinal lymphadenopathy concern for lung cancer   SUBJECTIVE    Patient reports poor sleep architecture overnight.  Reports mild-moderate left chest tube insertion site pain.     PAST MEDICAL HISTORY   Past Medical History:  Diagnosis Date  . Chronic kidney disease   . Depression   . Diabetes mellitus without complication ([place])   . Glaucoma   . Glaucoma   . Gout   . Heart murmur   . HLD (hyperlipidemia)   . Hypertension   . Prostate cancer ([place])   . Renal failure    dialysis t/t/s  . Stroke ([place])    tia x 2     SURGICAL HISTORY   Past Surgical History:  Procedure Laterality Date  . AMPUTATION TOE Bilateral 07/23/2018   Procedure: TOE MPJ RIGHT 3RD AND LEFT 2ND;  Surgeon: Samara Deist, DPM;  Location: [facility];  Service: Podiatry;  Laterality: Bilateral;  . APPENDECTOMY    . AV FISTULA PLACEMENT Left 06/09/2018   Procedure: ARTERIOVENOUS (AV) FISTULA CREATION ( BRACHIAL CEPHALIC);  Surgeon: Algernon Huxley, MD;  Location: [facility];  Service: Vascular;  Laterality: Left;  . CHOLECYSTECTOMY    . DIALYSIS/PERMA CATHETER INSERTION N/A 03/24/2018   Procedure: DIALYSIS/PERMA CATHETER INSERTION;  Surgeon: Katha Cabal, MD;  Location: [place] [facility];  Service: Cardiovascular;  Laterality: N/A;  . DIALYSIS/PERMA CATHETER REMOVAL N/A 09/06/2018   Procedure: DIALYSIS/PERMA CATHETER REMOVAL;  Surgeon: Algernon Huxley, MD;  Location: [place] [facility];  Service: Cardiovascular;  Laterality: N/A;  . INSERTION PROSTATE RADIATION SEED    . IR PERC PLEURAL DRAIN W/INDWELL CATH W/IMG GUIDE  11/03/2019  . IRRIGATION AND DEBRIDEMENT FOOT Right 12/17/2018   Procedure: IRRIGATION AND DEBRIDEMENT FOOT;  Surgeon:  Samara Deist, DPM;  Location: [facility];  Service: Podiatry;  Laterality: Right;  . LOWER EXTREMITY ANGIOGRAPHY Right 06/16/2018   Procedure: LOWER EXTREMITY ANGIOGRAPHY;  Surgeon: Algernon Huxley, MD;  Location: [place] [facility];  Service: Cardiovascular;  Laterality: Right;  . LOWER EXTREMITY ANGIOGRAPHY Left 08/30/2018   Procedure: LOWER EXTREMITY ANGIOGRAPHY;  Surgeon: Algernon Huxley, MD;  Location: [place] [facility];  Service: Cardiovascular;  Laterality: Left;  . LOWER EXTREMITY ANGIOGRAPHY Right 09/30/2018   Procedure: LOWER EXTREMITY ANGIOGRAPHY;  Surgeon: Algernon Huxley, MD;  Location: [place] [facility];  Service: Cardiovascular;  Laterality: Right;  . LOWER EXTREMITY ANGIOGRAPHY Left 10/07/2018   Procedure: LOWER EXTREMITY ANGIOGRAPHY;  Surgeon: Algernon Huxley, MD;  Location: [place] [facility];  Service: Cardiovascular;  Laterality: Left;  . LOWER EXTREMITY ANGIOGRAPHY Right 12/20/2018   Procedure: Lower Extremity Angiography;  Surgeon: Algernon Huxley, MD;  Location: [place] [facility];  Service: Cardiovascular;  Laterality: Right;  . TRANSMETATARSAL AMPUTATION Bilateral 10/13/2018   Procedure: 1.  Amputation right great toe MTPJ 2.  Amputation right second toe MTPJ 3.  Amputation right fourth toe MTPJ 4.  Amputation right fifth toe MTPJ 5.  Excision distal second metatarsal head right foot 6.  Excision distal third metatarsal head right foot 7.  Amputation left third toe 8.  Incision and  drainage infectionleft 2nd toe amputation site. ;  Surgeon: Samara Deist, DPM;  Location     FAMILY HISTORY   Family History  Problem Relation Age of Onset  . Varicose Veins Neg Hx      SOCIAL HISTORY   Social History   Tobacco Use  . Smoking status: Current Some Day Smoker    Packs/day: 0.10    Types: Cigarettes  . Smokeless tobacco: Never Used  Substance Use Topics  . Alcohol use: Not Currently    Alcohol/week: 0.0 standard drinks  . Drug use: Never     MEDICATIONS    Home  Medication:    Current Medication:  Current Facility-Administered Medications:  .  acetaminophen (TYLENOL) tablet 650 mg, 650 mg, Oral, Q6H PRN, Sharion Settler, NP .  escitalopram (LEXAPRO) tablet 10 mg, 10 mg, Oral, Daily, Sheikh, Omair Latif, DO, 10 mg at 11/04/19 1135 .  heparin injection 5,000 Units, 5,000 Units, Subcutaneous, Q8H, Sheikh, Omair Latif, DO .  insulin aspart (novoLOG) injection 0-5 Units, 0-5 Units, Subcutaneous, QHS, Garba, Mohammad L, MD .  insulin aspart (novoLOG) injection 0-6 Units, 0-6 Units, Subcutaneous, TID WC, Garba, Mohammad L, MD .  nicotine (NICODERM CQ - dosed in mg/24 hours) patch 21 mg, 21 mg, Transdermal, Daily, Allie Bossier, MD, 21 mg at 11/04/19 1135 .  ondansetron (ZOFRAN) tablet 4 mg, 4 mg, Oral, Q6H PRN **OR** ondansetron (ZOFRAN) injection 4 mg, 4 mg, Intravenous, Q6H PRN, Jonelle Sidle, Mohammad L, MD .  oxyCODONE (Oxy IR/ROXICODONE) immediate release tablet 5 mg, 5 mg, Oral, Q6H PRN, Sharion Settler, NP, 5 mg at 11/04/19 1145 .  tamsulosin (FLOMAX) capsule 0.4 mg, 0.4 mg, Oral, Daily, Sheikh, Omair Latif, DO, 0.4 mg at 11/04/19 1135 .  traMADol (ULTRAM) tablet 50 mg, 50 mg, Oral, Q6H PRN, Sharion Settler, NP    ALLERGIES   Penicillin g     REVIEW OF SYSTEMS    Review of Systems:  Gen:  Denies  fever, sweats, chills weigh loss  HEENT: Denies blurred vision, double vision, ear pain, eye pain, hearing loss, nose bleeds, sore throat Cardiac:  No dizziness, chest pain or heaviness, chest tightness,edema Resp:   Denies cough or sputum porduction, shortness of breath,wheezing, hemoptysis,  Gi: Denies swallowing difficulty, stomach pain, nausea or vomiting, diarrhea, constipation, bowel incontinence Gu:  Denies bladder incontinence, burning urine Ext:   Denies Joint pain, stiffness or swelling Skin: Denies  skin rash, easy bruising or bleeding or hives Endoc:  Denies polyuria, polydipsia , polyphagia or weight change Psych:   Denies  depression, insomnia or hallucinations   Other:  All other systems negative   VS: BP (!) 143/69 (BP Location: Right Arm)   Pulse 70   Temp 98.4 F (36.9 C)   Resp 17   Ht 6\' 1"  (1.854 m)   Wt 74.8 kg   SpO2 97%   BMI 21.77 kg/m      PHYSICAL EXAM    GENERAL:NAD, no fevers, chills, no weakness no fatigue HEAD: Normocephalic, atraumatic.  EYES: Pupils equal, round, reactive to light. Extraocular muscles intact. No scleral icterus.  MOUTH: Moist mucosal membrane. Dentition intact. No abscess noted.  EAR, NOSE, THROAT: Clear without exudates. No external lesions.  NECK: Supple. No thyromegaly. No nodules. No JVD.  PULMONARY: Mild rhonchi bilaterally with decreased breath sounds at the left base CARDIOVASCULAR: S1 and S2. Regular rate and rhythm. No murmurs, rubs, or gallops. No edema. Pedal pulses 2+ bilaterally.  GASTROINTESTINAL: Soft, nontender, nondistended. No masses. Positive bowel  sounds. No hepatosplenomegaly.  MUSCULOSKELETAL: No swelling, clubbing, or edema. Range of motion full in all extremities.  NEUROLOGIC: Cranial nerves II through XII are intact. No gross focal neurological deficits. Sensation intact. Reflexes intact.  SKIN: No ulceration, lesions, rashes, or cyanosis. Skin warm and dry. Turgor intact.  PSYCHIATRIC: Mood, affect within normal limits. The patient is awake, alert and oriented x 3. Insight, judgment intact.       IMAGING    CT ABDOMEN PELVIS WO CONTRAST  Result Date: 10/30/2019 CLINICAL DATA:  74 year old male with history of prostate cancer. Left upper lobe mass seen on earlier CT. EXAM: CT ABDOMEN AND PELVIS WITHOUT CONTRAST TECHNIQUE: Multidetector CT imaging of the abdomen and pelvis was performed following the standard protocol without IV contrast. COMPARISON:  Chest CT dated 10/30/2019. FINDINGS: Evaluation of this exam is limited in the absence of intravenous contrast. Lower chest: Please see report for chest CT of 10/30/2019. No  intra-abdominal free air or free fluid. Hepatobiliary: The liver is grossly unremarkable. No intrahepatic biliary ductal dilatation. There is irregular and masslike thickening of the gallbladder wall concerning for primary neoplasm or metastatic disease. Further initial evaluation with ultrasound recommended. MRI may provide better evaluation depending on the ultrasound findings. No calcified gallstone or pericholecystic fluid. Pancreas: Unremarkable. No pancreatic ductal dilatation or surrounding inflammatory changes. Spleen: Normal in size without focal abnormality. Adrenals/Urinary Tract: Indeterminate bilateral adrenal thickening or hyperplasia. Moderate bilateral renal parenchyma atrophy. There is a 15 mm left renal hypodense lesion which demonstrates fluid attenuation, likely a cyst. Additional smaller hypodense lesions are too small to characterize. There is no hydronephrosis on either side. The visualized ureters appear unremarkable. Excreted contrast from recent CT is noted within the bladder. Stomach/Bowel: There is extensive sigmoid diverticulosis and scattered colonic diverticula without active inflammatory changes. There is no bowel obstruction or active inflammation. The appendix is normal. Vascular/Lymphatic: Advanced aortoiliac atherosclerotic disease. Diffuse ectasia of the abdominal aorta with borderline aneurysmal dilatation of the infrarenal abdominal aorta measuring up to 3 cm in diameter. The IVC is unremarkable. Bilateral common iliac artery stents noted. No portal venous gas. There is no adenopathy. Reproductive: Prostate brachytherapy seeds. Other: Paucity of subcutaneous fat. Musculoskeletal: Degenerative changes of the spine with findings of renal osteodystrophy. No acute osseous pathology. Mild bilateral femoral head avascular necrosis. IMPRESSION: 1. Irregular and masslike thickening of the gallbladder wall concerning for primary neoplasm or metastatic disease. Further initial evaluation  with ultrasound recommended. 2. Extensive colonic diverticulosis. No bowel obstruction. Normal appendix. 3. Aortic Atherosclerosis (ICD10-I70.0). 4. A 3 cm infrarenal abdominal aortic aneurysm. Recommend followup by ultrasound in 3 years. This recommendation follows ACR consensus guidelines: White Paper of the ACR Incidental Findings Committee II on Vascular Findings. J Am Coll Radiol 2013; 10:789-794. Aortic aneurysm NOS (ICD10-I71.9) Electronically Signed   By: Anner Crete M.D.   On: 10/30/2019 19:40   DG Chest 1 View  Result Date: 11/04/2019 CLINICAL DATA:  Shortness of breath EXAM: CHEST  1 VIEW COMPARISON:  11/01/2019 FINDINGS: Interval placement of left-sided pleural drainage catheter with significant reduction in the size of a left-sided pleural effusion. Streaky opacity within the left lung base, which may reflect atelectasis. Known left upper lobe nodule, better seen on recent CT. The right lung appears clear. Heart size remains mildly enlarged. No pneumothorax. IMPRESSION: Interval placement of left-sided pleural drainage catheter with significant reduction in the size of a left-sided pleural effusion. No pneumothorax. Electronically Signed   By: Davina Poke D.O.   On: 11/04/2019 08:04  CT CHEST W CONTRAST  Result Date: 10/30/2019 CLINICAL DATA:  Left pleural effusion. Thoracentesis yesterday demonstrated bloody fluid. Malignancy suspected. EXAM: CT CHEST WITH CONTRAST TECHNIQUE: Multidetector CT imaging of the chest was performed during intravenous contrast administration. CONTRAST:  50mL OMNIPAQUE IOHEXOL 300 MG/ML  SOLN COMPARISON:  Recent chest x-rays FINDINGS: Cardiovascular: Atherosclerotic changes are seen in the thoracic aorta. The ascending thoracic aorta measures up to 4.1 cm, mildly aneurysmal. The central pulmonary arteries are normal. Coronary artery calcifications involving the right and left coronary arteries. The heart is unremarkable. Mediastinum/Nodes: There is a mildly  prominent node in the base of the neck on the left measuring 9 mm in short axis on series 2, image 14, asymmetric to the right. There is adenopathy with a node measuring 13 mm in short axis in the prevascular space. There is a prominent node in the AP window measuring 15 mm on series 2, image 56. No subcarinal adenopathy. No right-sided adenopathy identified. There is a moderate to large left effusion and a tiny right effusion. No pericardial effusion identified. The thyroid is unremarkable. The esophagus is normal. Lungs/Pleura: There is a spiculated mass in the left upper lobe with central cavitation. Platelike opacity extends off the inferior aspect of the mass, likely associated atelectasis. The mass extends to the pleural surface with some adjacent pleural fluid and pleural thickening. The mass appears to measure 3.2 by 1.7 by 1.6 cm in craniocaudal, transverse, and AP dimensions. There is a moderate left-sided pleural effusion with underlying opacity, likely atelectasis. No centrally obstructing mass is identified on the left. There is a small right effusion with associated atelectasis. Emphysematous changes are seen in the lungs. Upper Abdomen: Visualized liver and gallbladder are normal. The right kidney is relatively atrophic. The left kidney is not visualized. The left adrenal gland is mildly prominent as seen on series 2, image 162 but not fully evaluated. No other abnormalities in the upper abdomen. Musculoskeletal: No chest wall abnormality. No acute or significant osseous findings. IMPRESSION: 1. Spiculated cavitated mass in the left upper lobe is above highly suspicious for malignancy. Linear opacity extending off the inferior mass is likely atelectasis. The mass extends to the left pleural surface with mild associated pleural thickening and fluid. Given history, the large left pleural effusion is likely malignant. 2. Adenopathy in the mediastinum worrisome for metastatic disease. A PET-CT could  better evaluate. 3. 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 4. Mildly prominent left adrenal gland, incompletely evaluated. Recommend attention on PET-CT. 5. Coronary artery calcifications. 6. Atelectasis in the bases. 7. Emphysematous changes in the lungs. Aortic Atherosclerosis (ICD10-I70.0) and Emphysema (ICD10-J43.9). Electronically Signed   By: Dorise Bullion III M.D   On: 10/30/2019 10:57   US Pelvis Limited  Result Date: 10/22/2019 CLINICAL DATA:  74 year old male with a history of urinary frequency EXAM: LIMITED ULTRASOUND OF PELVIS TECHNIQUE: Limited transabdominal ultrasound examination of the pelvis was performed. COMPARISON:  None. FINDINGS: Urinary bladder measures 440 cc prevoid, 403 cc post void. Prostate volume estimated 15 cc. IMPRESSION: Significant post void residual indicating urinary retention. Prostate volume estimated 15 cc. Electronically Signed   By: Corrie Mckusick D.O.   On: 10/22/2019 12:24   DG Chest Port 1 View  Result Date: 11/01/2019 CLINICAL DATA:  Pleural effusion EXAM: PORTABLE CHEST 1 VIEW COMPARISON:  Oct 30, 2019 FINDINGS: The heart size and mediastinal contours ar unchanged with cardiomegaly which is partially obscured. Again noted is a peripherally based rounded airspace opacity  in the left upper lung. A large left effusion/consolidation is seen. The right lung is clear. No acute osseous abnormality. IMPRESSION: Rounded airspace opacities/nodule seen within the left upper lung as on recent CT. Large left pleural effusion/consolidation Electronically Signed   By: Prudencio Pair M.D.   On: 11/01/2019 05:42   DG Chest Port 1 View  Result Date: 10/29/2019 CLINICAL DATA:  74 year old male status post thoracentesis. EXAM: PORTABLE CHEST 1 VIEW COMPARISON:  Earlier radiograph dated 10/29/2019. FINDINGS: Interval decrease in the size of the left pleural effusion with moderate residual effusion. There is aerated portion of the left upper  lobe. The right lung is clear. No pneumothorax. Stable cardiomediastinal silhouette. No acute osseous pathology. IMPRESSION: Interval decrease in the size of the left pleural effusion with moderate residual effusion. No pneumothorax. Electronically Signed   By: Anner Crete M.D.   On: 10/29/2019 23:34   DG Chest Port 1 View  Result Date: 10/29/2019 CLINICAL DATA:  Dyspnea. Abdominal pain and vomiting while at dialysis today, unable to get complete treatment. EXAM: PORTABLE CHEST 1 VIEW COMPARISON:  Radiograph 10/22/2019 FINDINGS: Progressive left pleural effusion with near complete opacification of the left hemithorax, small portion of left apical lung is aerated. There is rightward mediastinal shift suggesting large fluid volume rather than compressive atelectasis. Mediastinal contours are obscured by left lung opacity. Probable right perihilar atelectasis. No significant right pleural effusion. No pneumothorax. IMPRESSION: Progressive left pleural effusion over the past week with near complete opacification of the left hemithorax. Rightward mediastinal shift suggesting large fluid volume volume rather than passive atelectasis. Electronically Signed   By: Keith Rake M.D.   On: 10/29/2019 20:45   DG Chest Portable 1 View  Result Date: 10/22/2019 CLINICAL DATA:  Productive cough for 4 days with congestion and watery eyes. Current smoker. History of diabetes. EXAM: PORTABLE CHEST 1 VIEW COMPARISON:  Chest radiographs 06/12/2018 and 03/21/2018. FINDINGS: 1141 hours. Dialysis catheter has been removed in the interval. The visualized heart size and mediastinal contours are stable with mild aortic atherosclerosis. There is a new moderate-sized left pleural effusion with associated left basilar pulmonary opacity. No significant right pleural effusion. No pneumothorax. The pulmonary vasculature is slightly indistinct, and there may be mild pulmonary edema. No significant osseous findings. IMPRESSION: New  moderate-sized left pleural effusion with associated left basilar atelectasis or infiltrate. Possible mild pulmonary edema. Electronically Signed   By: Richardean Sale M.D.   On: 10/22/2019 12:18   US Abdomen Limited RUQ  Result Date: 11/04/2019 CLINICAL DATA:  Thickened gallbladder wall on CT. EXAM: ULTRASOUND ABDOMEN LIMITED RIGHT UPPER QUADRANT COMPARISON:  CT 10/30/2019. FINDINGS: Gallbladder: Multiple shadowing echo densities in the gallbladder fossa consistent with gallstones in a contracted gallbladder. Gallbladder wall incompletely evaluated due to shadowing from gallstones. Visualized portion of the gallbladder wall measures 2 mm. Negative Murphy sign. Common bile duct: Diameter: 3 mm Liver: No focal lesion identified. Within normal limits in parenchymal echogenicity. Portal vein is patent on color Doppler imaging with normal direction of blood flow towards the liver. Other: None. IMPRESSION: 1. Findings consistent with multiple gallstones in a contracted gallbladder. Gallbladder wall incompletely evaluated due to shadowing from gallstones. 2.  No evidence of biliary distention.  Liver appears normal. Electronically Signed   By: Marcello Moores  Register   On: 11/04/2019 11:44   IR PERC PLEURAL DRAIN W/INDWELL CATH W/IMG GUIDE  Result Date: 11/03/2019 CLINICAL DATA:  Malignant left pleural effusion. Rapid reaccumulation after large volume left thoracentesis on 10/29/2019. Tunneled pleural drainage  catheter placement has been requested. EXAM: INSERTION OF TUNNELED PLEURAL DRAINAGE CATHETER ANESTHESIA/SEDATION: 0.5 mg IV Versed; 25 mcg IV Fentanyl. Total Moderate Sedation Time 12 minutes. The patient's level of consciousness and physiologic status were continuously monitored during the procedure by Radiology nursing. MEDICATIONS: 600 mg IV clindamycin. Antibiotic was administered in an appropriate time interval for the procedure. FLUOROSCOPY TIME:  No fluoroscopy was utilized. PROCEDURE: The procedure, risks,  benefits, and alternatives were explained to the patient. Questions regarding the procedure were encouraged and answered. The patient understands and consents to the procedure. A time-out was performed prior to initiating the procedure. The left lateral chest wall was prepped with chlorhexidine in a sterile fashion, and a sterile drape was applied covering the operative field. A sterile gown and sterile gloves were used for the procedure. Local anesthesia was provided with 1% Lidocaine. Ultrasound image documentation was performed. After creating a small skin incision, a 19 gauge needle was advanced into the pleural cavity under ultrasound guidance. A guide wire was then advanced into the pleural space. Pleural access was dilated serially and a 16-French peel-away sheath placed. A 15.5 French tunneled PleurX catheter was placed. This was tunneled from an incision 5 cm below the pleural access to the access site. The catheter was advanced through the peel-away sheath. The sheath was then removed. The access incision was closed with subcuticular 4-0 Monocryl. Dermabond was applied to the incision. A Prolene retention suture was applied at the catheter exit site. Large volume thoracentesis was performed through the new catheter utilizing gravity drainage bag. COMPLICATIONS: None. FINDINGS: The catheter was placed via the left lateral chest wall. Approximately 2.4 liters of bloody pleural fluid was able to be removed after catheter placement. IMPRESSION: Placement of permanent, tunneled left pleural drainage catheter via lateral approach. 2.4 liters of pleural fluid was removed today after catheter placement. Electronically Signed   By: Aletta Edouard M.D.   On: 11/03/2019 15:35   US THORACENTESIS ASP PLEURAL SPACE W/IMG GUIDE  Result Date: 10/29/2019 INDICATION: 74 year old with shortness of breath and large left pleural effusion. EXAM: ULTRASOUND GUIDED LEFT THORACENTESIS MEDICATIONS: None. COMPLICATIONS: None  immediate. PROCEDURE: An ultrasound guided thoracentesis was thoroughly discussed with the patient and questions answered. The benefits, risks, alternatives and complications were also discussed. The patient understands and wishes to proceed with the procedure. Written consent was obtained. Ultrasound was performed to localize and mark an adequate pocket of fluid in the left chest. The area was then prepped and draped in the normal sterile fashion. 1% Lidocaine was used for local anesthesia. Under ultrasound guidance a 6 Fr Safe-T-Centesis catheter was introduced. Thoracentesis was performed. The catheter was removed and a dressing applied. FINDINGS: A total of approximately 2.35 L of bloody fluid was removed. Samples were sent to the laboratory as requested by the clinical team. IMPRESSION: Successful ultrasound guided left thoracentesis yielding 2.35 L of pleural fluid. Electronically Signed   By: Markus Daft M.D.   On: 10/29/2019 23:26      ASSESSMENT/PLAN     Large left pleural effusion with hilar and mediastinal lymphadenopathy as well as left apical spiculated lesion    -Constellation of findings suggestive of possible pulmonary malignancy    -Patient reports chills mild diaphoresis as well as recent weight loss concerning for constitutional symptoms    -High pretest probability for malignancy due to lifelong smoking from age 37 to current active smoking status approximately 1/2 pack daily-for total over 70 pack years smoking history   -Status post  thoracentesis-cytology is pending   -Discussed endobronchial ultrasound assisted lymph node biopsies-patient is agreeable   -Currently patient's respiratory status significantly improved post thoracentesis and he may need only outpatient work-up.   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     Thank you for allowing me to participate in  the care of this patient.   Patient/Family are satisfied with care plan and all questions have been answered.   This document was prepared using Dragon voice recognition software and may include unintentional dictation errors.     Ottie Glazier, M.D.  Division of [place]

## 2019-11-04 NOTE — Progress Notes (Signed)
PROGRESS NOTE    Jose [name]  SHF:[id number] DOB: [birth year] DOA: 10/29/2019 PCP: [name], MD  Brief Narrative:  HPI per Dr. Gala Romney on 10/30/19 Jose [name] is a 74 y.o. male with medical history significant of end-stage renal disease on hemodialysis TTS, diabetes, hypertension, prostate cancer, CVA, depression, glaucoma and gout with hyperlipidemia who came to the ER with significant shortness of breath.  Patient's son gave the history that he was at dialysis and started having abdominal pain and then had vomiting.  He did not complete his treatment then.  They took him off early and was brought to the ER.  He was in the hospital recently and discharged on Thursday.  During the hospitalization he was also having significant shortness of breath and cough.  He was treated for productive cough and shortness of breath.  During that time he did have some watery diarrhea.  No fever or chills.  He was noted to have turbid urine suspected to have UTI.  He was treated for pulmonary edema and acute on chronic diastolic heart failure which resolved.  Today however patient was noted to have large left left-sided pleural effusion suspected to be primarily responsible for his symptoms.  He had thoracentesis performed tonight with about 2 L of fluid removed.  He is relatively improved but still short of breath.  Patient being admitted for further treatment in the hospital..  ED Course: Temperature 97.8 blood pressure 130/72 pulse 89 respiratory of 21 oxygen sat 83% on room air currently 95% on 2 L.  White count is 15,000 hemoglobin 12.8 and platelets 279 sodium 139 potassium 3.3 chloride 98.  His CO2 is 24 BUN 59 creatinine 6.95 and calcium 8.5.  Glucose is 199 INR 1.1.  Initial chest x-ray showed progressive left pleural effusion worsening since last week with complete opacification of the left hemithorax.  Subsequent ultrasound-guided thoracentesis performed with about 2 L of fluid  removed.  **Interim History  Underwent Pleurx catheter placement yesterday after dialysis.  Pulmonary, Interventional radiology and palliative care have all been consulted in addition to nephrology.  Next dialysis session is scheduled for Saturday.  Under went Tunneled Left PleurX Catheter yesterday afternoon and had it drained and had 2 bottles of bloody fluid. He remains on 2 Liters of Supplemental O2 via [place].  Have consulted medical oncology for further work-up and assistance for his likely cancer.  Ordered a ultrasound of the abdomen given the CT findings and it showed a contracted gallbladder with multiple gallstones but was negative for Murphy sign.  Will need to discuss with pulmonary about  endobronchial ultrasound assisted lymph node biopsy for diagnosis and appreciate medical oncology's evaluation of the patient.  Assessment & Plan:   Principal Problem:   Pleural effusion on left Active Problems:   Type II diabetes mellitus with renal manifestations (HCC)   Hypertension   ESRD on dialysis (HCC)   Tobacco abuse   Acute on chronic diastolic CHF (congestive heart failure) (HCC)   SOB (shortness of breath)   Hypokalemia   Chronic diastolic CHF (congestive heart failure) (HCC)   Benign essential HTN   Malignant neoplasm of unknown origin ([place])   Acute respiratory failure with hypoxia (HCC)   End-stage renal disease on hemodialysis ([place])   Diabetes mellitus type 2, controlled, with complications (HCC)  Chronic Diastolic CHF -C/w Strict I's and O's and Daily Weights; Patient is -227 mL since Admission -Question of weight is accurate as his weight went from 120 pounds to  165 pounds -Currently patient's BP controlled without medication monitor closely -Need to monitor for signs and symptoms of volume overload and he is usually dialyzed for volume maintenance given his ESRD; Underwent Dialysis yesterday  Essential HTN -Blood pressure is relatively stable and last blood pressure reading  was 143/69 -Continue to monitor blood pressures per protocol -Currently not on any antihypertensives  Metastatic lung cancer vs primary lung cancer -Patient has primary versus metastatic cancer in his lung. -CT abdomen pelvis showed masslike thickening of gallbladder; the CT scan showed an irregular and mass like thickening of the gallbladder wall concerning for primary neoplasm or metastatic disease and recommended initial evaluation with ultrasound and there is also extensive colonic diverticulosis and no bowel obstruction.  There is also aortic atherosclerosis and at 3 cm infrarenal abdominal aortic aneurysm noted  -Ultrasound of the gallbladder done today cystic with multiple gallstones and a contracted gallbladder.  The gallbladder is incompletely evaluated due to shadowing from the gallstones and there is no evidence of any biliary distention and the liver appeared normal -CT of the chest showed a spiculated cavitated mass in the left upper lobe highly suspicious for malignancy with a linear opacity extending off the inferior mass which is likely atelectasis.  There is also a large left pleural effusion extends to the pleural space associated with pleural thickening fluid -Pulmonary consulted and they have discussed endobronchial ultrasound assisted lymph node biopsies and patient is agreeable but this has not been done yet -Will need to Discuss the case with Medical Oncology about further evaluation and work-up about the cancer. -Cytology from the thoracentesis done on 10/29/2018 was still pending -Will get PT/OT to further evaluate and Treat  Recurrent LEFT-sided pleural effusion -So far analysis showed transudate. Awaiting full chemistry as well as Gram stain and culture. Suspected all due to anasarca from renal failure. Will need to monitor closely -5/29 LEFT thoracentesis aspirated 2.3 L bloody fluid. -5/30 CT chest showed spiculated cavitated mass in the upper left lobe which is highly  suspicious for malignancy.  There is a linear opacity extending off the inferior mass that is likely atelectasis.  The mass extends into the left pleural surface with mild associated pleural thickening fluid.  The large left pleural effusion is likely malignant and he does have adenopathy in the mediastinum worrisome for metastatic disease. -He will need a PET CT scan to further evaluate in the outpatient setting next -5/30 Dr. Sherral Hammers spoke with Levada Dy in [facility] stateed fluid was sent off for pathology/cytology to [facility] earliest we would hear back any results is Tuesday. -5/31 discussed case with Dr Ottie Glazier PCCM who concurs that patient most likely has cancer. Per Dr. Madelin Rear Discusscion he would like to defer EBUS until results of thoracentesis return. -6/1 PCXR showed "Rounded airspace opacities/nodule seen within the left upper lung as on recent CT. Large left pleural effusion/consolidation" -Again the cytology/pathology report has not returned from thoracentesis conducted on 5/29 -6/1 have requested IR drainage of recurrent LEFT pleural effusion with placement of Pleurx cath; This was done 6/3 after dialysis  -Continue to hold Plavix for now but likely can resume now that his PleruX has been placed but will hold for now until EBUS is likely done -Pulmonary is consulted for further evaluation and discussed endobronchial ultrasound assisted lymph node biopsies and patient was agreeable -Resume sq Heparin but will continue to hold Plavix at this time -Continue supplemental oxygen via nasal cannula as below  Acute Respiratory Failure with Hypoxia -Per patient  has had multiple episodes of pleural effusions requiring draining, never worked up. -See recurrent pleural effusion as above -Continue with supplemental oxygen via nasal cannula and wean O2 as tolerated -SpO2: 97 % O2 Flow Rate (L/min): 2 L/min  -Continuous pulse oximetry and maintain O2 saturations greater than 92% -Repeat chest  x-ray in the a.m now that [place] is placed; Showed "Interval placement of left-sided pleural drainage catheter with significant reduction in the size of a left-sided pleural effusion. No pneumothorax."  ESRD on HD TTS, Hyperphosphatemia -Per EMR partial session of HD on Thursday.   -BUN/creatinine went from 82/8.61 -> 65/6.87 -> 75/7.28 -> 40/4.80 -Will be dialyzed again in the a.m nephrology feels that he has no acute indication for dialysis today  Anemia of Chronic Kidney Disease -Hold his Plavix for now  -Patient's hemoglobin/hematocrit is relatively stable and is now 11.5/33.9 -Check anemia panel in the a.m.  -Continue to monitor for signs or symptoms of bleeding; currently no overt bleeding noted -CBC in a.m. -Nephrology recommending to hold Epogen at this time due to his likely cancer diagnosis  DM type II controlled with complication -8/75 hemoglobin A1c= 5.6 -Ultrasensitive SSI; CBGs have been on the lower side given that he has been n.p.o. and has CBGs ranging from 72-169  Hyponatremia -Mild with a sodium of 133 and improved to 139 -Continue to monitor and trend and repeat CMP in the a.m. -Correct with dialysis  Hyperkalemia -Patient's potassium was 5.3 and today is 4.8 -Corrected in dialysis yesterday  -Continue monitor and trend and repeat CMP in the a.m.  Goals of Care -5/31 Palliative Care Consult;  -Appreciate assistance for [place] Discussion  DVT prophylaxis: SCDs Code Status: FULL CODE Family Communication: No family present at bedside  Disposition Plan: We will need to discuss with medical oncology for appropriate follow-up prior to safe discharge disposition.  We will get PT and OT to further evaluate and treat.  Status is: Inpatient  Remains inpatient appropriate because:Ongoing diagnostic testing needed not appropriate for outpatient work up, Unsafe d/c plan and Inpatient level of care appropriate due to severity of illness   Dispo: The patient is from:  Home              Anticipated d/c is to: TBD              Anticipated d/c date is: 1 day              Patient currently is not medically stable to d/c.  Consultants:   Nephrology  Pulmonary  Interventional Radiology   Palliative Care Medicine   Medical Oncology    Procedures:  IR to place Plure-X    Antimicrobials:  Anti-infectives (From admission, onward)   Start     Dose/Rate Route Frequency Ordered Stop   11/03/19 1445  clindamycin (CLEOCIN) IVPB 300 mg     over 30 Minutes Intravenous Continuous PRN 11/03/19 1447 11/03/19 1445   11/03/19 1336  clindamycin (CLEOCIN) 600 MG/50ML IVPB    Note to Pharmacy: Forestine Chute   : cabinet override      11/03/19 1336 11/03/19 1623   11/03/19 1330  clindamycin (CLEOCIN) IVPB 600 mg     600 mg 100 mL/hr over 30 Minutes Intravenous On call 11/03/19 0416 11/03/19 1653   11/02/19 1445  vancomycin (VANCOCIN) IVPB 1000 mg/200 mL premix  Status:  Discontinued     1,000 mg 200 mL/hr over 60 Minutes Intravenous  Once 11/02/19 1438 11/02/19 1440     Subjective:  Seen and examined at bedside and he is lying in bed resting and complaining about being hungry today.  No nausea or vomiting.  Denies any lightheadedness or dizziness.  Has some abdominal discomfort and some mild chest discomfort from where his tox catheter is in an complaint of soreness.  No other concerns or complaints at this time.  Objective: Vitals:   11/03/19 1545 11/03/19 1621 11/03/19 2346 11/04/19 0826  BP: (!) 144/76 (!) 153/81 132/68 (!) 143/69  Pulse: 72 67 66 70  Resp: 16 20 18 17   Temp:  97.7 F (36.5 C) 98.2 F (36.8 C) 98.4 F (36.9 C)  TempSrc:  Oral    SpO2: 95% 97% 99% 97%  Weight:      Height:        Intake/Output Summary (Last 24 hours) at 11/04/2019 1543 Last data filed at 11/04/2019 0340 Gross per 24 hour  Intake --  Output 400 ml  Net -400 ml   Filed Weights   11/01/19 5537 11/02/19 0632 11/03/19 1348  Weight: 59 kg 73.5 kg 74.8 kg    Examination: Physical Exam:  Constitutional: Thin cachectic and slightly disheveled chronically ill-appearing African-American male in no acute distress and is laying in bed resting. Eyes: Lids and conjunctivae normal, sclerae anicteric  ENMT: External Ears, Nose appear normal. Grossly normal hearing.  Neck: Appears normal, supple, no cervical masses, normal ROM, no appreciable thyromegaly; no JVD Respiratory: Diminished to auscultation bilaterally, no wheezing, rales, rhonchi or crackles. Normal respiratory effort and patient is not tachypenic. No accessory muscle use.  Unlabored breathing but is wearing 2 L of supplemental oxygen.  Has a left Pleurx catheter in place now there is bandage Cardiovascular: RRR, no murmurs / rubs / gallops. S1 and S2 auscultated. No extremity edema.  Abdomen: Soft, non-tender, non-distended. Bowel sounds positive.  GU: Deferred. Musculoskeletal: No clubbing / cyanosis of digits/nails.  Has a right transmetatarsal amputation and has some toes amputated on his left foot as well Skin: No rashes, lesions, ulcers on limited skin evaluation. No induration; Warm and dry.  Neurologic: CN 2-12 grossly intact with no focal deficits.  Romberg sign cerebellar reflexes not assessed.  Psychiatric: Normal judgment and insight. Alert and oriented x 3. Normal mood and appropriate affect.   Data Reviewed: I have personally reviewed following labs and imaging studies  CBC: Recent Labs  Lab 10/31/19 0332 11/01/19 0405 11/02/19 0558 11/03/19 0606 11/04/19 0404  WBC 9.7 9.3 8.0 8.6 10.3  NEUTROABS 6.8 6.1 5.2 5.9 7.6  HGB 11.3* 11.0* 11.3* 11.1* 11.5*  HCT 34.5* 33.4* 34.3* 33.5* 33.9*  MCV 100.9* 99.1 99.1 99.1 97.4  PLT 245 259 226 245 482   Basic Metabolic Panel: Recent Labs  Lab 10/31/19 0332 11/01/19 0405 11/02/19 0558 11/03/19 0606 11/04/19 0404  NA 136 133* 134* 133* 138  K 4.6 4.9 4.7 5.3* 4.8  CL 97* 95* 97* 98 96*  CO2 26 25 26 27 29   GLUCOSE 64*  71 78 79 86  BUN 74* 82* 65* 75* 40*  CREATININE 8.49* 8.64* 6.87* 7.28* 4.80*  CALCIUM 7.5* 7.8* 7.9* 8.0* 8.4*  MG 2.1 2.1 2.0 2.1 2.0  PHOS 5.8* 5.8* 5.2* 5.4* 4.0   GFR: Estimated Creatinine Clearance: 14.5 mL/min (A) (by C-G formula based on SCr of 4.8 mg/dL (H)). Liver Function Tests: Recent Labs  Lab 10/31/19 0332 11/01/19 0405 11/02/19 0558 11/03/19 0606 11/04/19 0404  AST 14* 11* 11* 13* 11*  ALT 9 11 8 9 10   ALKPHOS 43  40 37* 38 42  BILITOT 0.7 0.7 0.5 0.5 0.3  PROT 6.2* 5.9* 5.9* 6.1* 6.4*  ALBUMIN 2.7* 2.6* 2.7* 2.6* 2.8*   Recent Labs  Lab 10/29/19 1403  LIPASE 41   No results for input(s): AMMONIA in the last 168 hours. Coagulation Profile: Recent Labs  Lab 10/29/19 2217  INR 1.1   Cardiac Enzymes: No results for input(s): CKTOTAL, CKMB, CKMBINDEX, TROPONINI in the last 168 hours. BNP (last 3 results) No results for input(s): PROBNP in the last 8760 hours. HbA1C: No results for input(s): HGBA1C in the last 72 hours. CBG: Recent Labs  Lab 11/03/19 1624 11/03/19 2200 11/03/19 2214 11/04/19 0827 11/04/19 1207  GLUCAP 68* 173* 169* 100* 78   Lipid Profile: No results for input(s): CHOL, HDL, LDLCALC, TRIG, CHOLHDL, LDLDIRECT in the last 72 hours. Thyroid Function Tests: No results for input(s): TSH, T4TOTAL, FREET4, T3FREE, THYROIDAB in the last 72 hours. Anemia Panel: No results for input(s): VITAMINB12, FOLATE, FERRITIN, TIBC, IRON, RETICCTPCT in the last 72 hours. Sepsis Labs: No results for input(s): PROCALCITON, LATICACIDVEN in the last 168 hours.  Recent Results (from the past 240 hour(s))  Body fluid culture     Status: None   Collection Time: 10/29/19 11:52 PM   Specimen: PATH Cytology Pleural fluid  Result Value Ref Range Status   Specimen Description FLUID PLEURAL  Final   Special Requests NONE  Final   Gram Stain NO WBC SEEN NO ORGANISMS SEEN   Final   Culture   Final    NO GROWTH 3 DAYS Performed at [facility], [address], [place], [place] [address]    Report Status 11/02/2019 FINAL  Final     RN Pressure Injury Documentation:     Estimated body mass index is 21.77 kg/m as calculated from the following:   Height as of this encounter: 6\' 1"  (1.854 m).   Weight as of this encounter: 74.8 kg.  Malnutrition Type:      Malnutrition Characteristics:      Nutrition Interventions:    Radiology Studies: DG Chest 1 View  Result Date: 11/04/2019 CLINICAL DATA:  Shortness of breath EXAM: CHEST  1 VIEW COMPARISON:  11/01/2019 FINDINGS: Interval placement of left-sided pleural drainage catheter with significant reduction in the size of a left-sided pleural effusion. Streaky opacity within the left lung base, which may reflect atelectasis. Known left upper lobe nodule, better seen on recent CT. The right lung appears clear. Heart size remains mildly enlarged. No pneumothorax. IMPRESSION: Interval placement of left-sided pleural drainage catheter with significant reduction in the size of a left-sided pleural effusion. No pneumothorax. Electronically Signed   By: Davina Poke D.O.   On: 11/04/2019 08:04   US Abdomen Limited RUQ  Result Date: 11/04/2019 CLINICAL DATA:  Thickened gallbladder wall on CT. EXAM: ULTRASOUND ABDOMEN LIMITED RIGHT UPPER QUADRANT COMPARISON:  CT 10/30/2019. FINDINGS: Gallbladder: Multiple shadowing echo densities in the gallbladder fossa consistent with gallstones in a contracted gallbladder. Gallbladder wall incompletely evaluated due to shadowing from gallstones. Visualized portion of the gallbladder wall measures 2 mm. Negative Murphy sign. Common bile duct: Diameter: 3 mm Liver: No focal lesion identified. Within normal limits in parenchymal echogenicity. Portal vein is patent on color Doppler imaging with normal direction of blood flow towards the liver. Other: None. IMPRESSION: 1. Findings consistent with multiple gallstones in a contracted gallbladder. Gallbladder wall  incompletely evaluated due to shadowing from gallstones. 2.  No evidence of biliary distention.  Liver appears  normal. Electronically Signed   By: Marcello Moores  Register   On: 11/04/2019 11:44   IR PERC PLEURAL DRAIN W/INDWELL CATH W/IMG GUIDE  Result Date: 11/03/2019 CLINICAL DATA:  Malignant left pleural effusion. Rapid reaccumulation after large volume left thoracentesis on 10/29/2019. Tunneled pleural drainage catheter placement has been requested. EXAM: INSERTION OF TUNNELED PLEURAL DRAINAGE CATHETER ANESTHESIA/SEDATION: 0.5 mg IV Versed; 25 mcg IV Fentanyl. Total Moderate Sedation Time 12 minutes. The patient's level of consciousness and physiologic status were continuously monitored during the procedure by Radiology nursing. MEDICATIONS: 600 mg IV clindamycin. Antibiotic was administered in an appropriate time interval for the procedure. FLUOROSCOPY TIME:  No fluoroscopy was utilized. PROCEDURE: The procedure, risks, benefits, and alternatives were explained to the patient. Questions regarding the procedure were encouraged and answered. The patient understands and consents to the procedure. A time-out was performed prior to initiating the procedure. The left lateral chest wall was prepped with chlorhexidine in a sterile fashion, and a sterile drape was applied covering the operative field. A sterile gown and sterile gloves were used for the procedure. Local anesthesia was provided with 1% Lidocaine. Ultrasound image documentation was performed. After creating a small skin incision, a 19 gauge needle was advanced into the pleural cavity under ultrasound guidance. A guide wire was then advanced into the pleural space. Pleural access was dilated serially and a 16-French peel-away sheath placed. A 15.5 French tunneled PleurX catheter was placed. This was tunneled from an incision 5 cm below the pleural access to the access site. The catheter was advanced through the peel-away sheath. The sheath was then removed. The  access incision was closed with subcuticular 4-0 Monocryl. Dermabond was applied to the incision. A Prolene retention suture was applied at the catheter exit site. Large volume thoracentesis was performed through the new catheter utilizing gravity drainage bag. COMPLICATIONS: None. FINDINGS: The catheter was placed via the left lateral chest wall. Approximately 2.4 liters of bloody pleural fluid was able to be removed after catheter placement. IMPRESSION: Placement of permanent, tunneled left pleural drainage catheter via lateral approach. 2.4 liters of pleural fluid was removed today after catheter placement. Electronically Signed   By: Aletta Edouard M.D.   On: 11/03/2019 15:35   Scheduled Meds:  escitalopram  10 mg Oral Daily   heparin  5,000 Units Subcutaneous Q8H   insulin aspart  0-5 Units Subcutaneous QHS   insulin aspart  0-6 Units Subcutaneous TID WC   nicotine  21 mg Transdermal Daily   tamsulosin  0.4 mg Oral Daily   Continuous Infusions:    LOS: 6 days   Kerney Elbe, DO [facility] PAGER is on AMION  If 7PM-7AM, please contact night-coverage [email or web address]

## 2019-11-05 DIAGNOSIS — Z720 Tobacco use: Secondary | ICD-10-CM

## 2019-11-05 DIAGNOSIS — K828 Other specified diseases of gallbladder: Secondary | ICD-10-CM

## 2019-11-05 LAB — GLUCOSE, CAPILLARY
Glucose-Capillary: 90 mg/dL (ref 70–99)
Glucose-Capillary: 128 mg/dL — ABNORMAL HIGH (ref 70–99)

## 2019-11-05 LAB — CBC WITH DIFFERENTIAL/PLATELET
Abs Immature Granulocytes: 0.05 10*3/uL (ref 0.00–0.07)
Basophils Absolute: 0.1 10*3/uL (ref 0.0–0.1)
Basophils Relative: 1 %
Eosinophils Absolute: 0.2 10*3/uL (ref 0.0–0.5)
Eosinophils Relative: 2 %
HCT: 36.6 % — ABNORMAL LOW (ref 39.0–52.0)
Hemoglobin: 11.8 g/dL — ABNORMAL LOW (ref 13.0–17.0)
Immature Granulocytes: 1 %
Lymphocytes Relative: 20 %
Lymphs Abs: 1.9 10*3/uL (ref 0.7–4.0)
MCH: 32.2 pg (ref 26.0–34.0)
MCHC: 32.2 g/dL (ref 30.0–36.0)
MCV: 100 fL (ref 80.0–100.0)
Monocytes Absolute: 0.8 10*3/uL (ref 0.1–1.0)
Monocytes Relative: 9 %
Neutro Abs: 6.4 10*3/uL (ref 1.7–7.7)
Neutrophils Relative %: 67 %
Platelets: 222 10*3/uL (ref 150–400)
RBC: 3.66 MIL/uL — ABNORMAL LOW (ref 4.22–5.81)
RDW: 14.1 % (ref 11.5–15.5)
WBC: 9.5 10*3/uL (ref 4.0–10.5)
nRBC: 0 % (ref 0.0–0.2)

## 2019-11-05 LAB — COMPREHENSIVE METABOLIC PANEL
ALT: 8 U/L (ref 0–44)
AST: 12 U/L — ABNORMAL LOW (ref 15–41)
Albumin: 2.9 g/dL — ABNORMAL LOW (ref 3.5–5.0)
Alkaline Phosphatase: 42 U/L (ref 38–126)
Anion gap: 11 (ref 5–15)
BUN: 47 mg/dL — ABNORMAL HIGH (ref 8–23)
CO2: 29 mmol/L (ref 22–32)
Calcium: 8.8 mg/dL — ABNORMAL LOW (ref 8.9–10.3)
Chloride: 96 mmol/L — ABNORMAL LOW (ref 98–111)
Creatinine, Ser: 5.81 mg/dL — ABNORMAL HIGH (ref 0.61–1.24)
GFR calc Af Amer: 10 mL/min — ABNORMAL LOW (ref >60)
GFR calc non Af Amer: 9 mL/min — ABNORMAL LOW (ref >60)
Glucose, Bld: 99 mg/dL (ref 70–99)
Potassium: 5.4 mmol/L — ABNORMAL HIGH (ref 3.5–5.1)
Sodium: 136 mmol/L (ref 135–145)
Total Bilirubin: 0.5 mg/dL (ref 0.3–1.2)
Total Protein: 6.3 g/dL — ABNORMAL LOW (ref 6.5–8.1)

## 2019-11-05 LAB — MAGNESIUM: Magnesium: 2.1 mg/dL (ref 1.7–2.4)

## 2019-11-05 LAB — CEA: CEA: 3.8 ng/mL (ref 0.0–4.7)

## 2019-11-05 LAB — CANCER ANTIGEN 19-9: CA 19-9: <2 U/mL (ref 0–35)

## 2019-11-05 LAB — PHOSPHORUS: Phosphorus: 4.8 mg/dL — ABNORMAL HIGH (ref 2.5–4.6)

## 2019-11-05 MED ORDER — ONDANSETRON HCL 4 MG PO TABS: 4.0000 mg | ORAL_TABLET | Freq: Four times a day (QID) | ORAL | 0 refills | Status: AC | PRN

## 2019-11-05 MED ORDER — NICOTINE 21 MG/24HR TD PT24: 21.0000 mg | MEDICATED_PATCH | Freq: Every day | TRANSDERMAL | 0 refills | Status: AC

## 2019-11-05 MED ORDER — OXYCODONE HCL 5 MG PO TABS: 5.0000 mg | ORAL_TABLET | Freq: Four times a day (QID) | ORAL | 0 refills | Status: DC | PRN

## 2019-11-05 NOTE — Progress Notes (Addendum)
[facility]  ROUNDING NOTE   Subjective:   The patient reports he is feeling better, denies any nausea,vomiting, shortness of breath.  Denies any issues with hemodialysis.  Patient is looking forward to his breakfast.    Objective:  Vital signs in last 24 hours:  Temp:  [97.9 F (36.6 C)-98.6 F (37 C)] 97.9 F (36.6 C) (06/05 0750) Pulse Rate:  [72-81] 81 (06/05 0750) Resp:  [16-17] 17 (06/05 0750) BP: (148-158)/(69-89) 152/89 (06/05 0750) SpO2:  [96 %-98 %] 98 % (06/05 0750) Weight:  [70.8 kg] 70.8 kg (06/05 0500)  Weight change: -4.082 kg Filed Weights   11/02/19 0632 11/03/19 1348 11/05/19 0500  Weight: 73.5 kg 74.8 kg 70.8 kg    Intake/Output: I/O last 3 completed shifts: In: -  Out: 909 [[id number]]   Intake/Output this shift:  No intake/output data recorded.  Physical Exam: General: NAD   Head: Normocephalic, atraumatic. Moist oral mucosal membranes  Eyes: Anicteric, PERRL  Neck: Supple, trachea midline  Lungs:  +crackles  Heart: Regular rate and rhythm  Abdomen:  Soft, nontender,   Extremities:  No peripheral edema.  Neurologic: Nonfocal, moving all four extremities  Skin: No lesions  Access: Left upper extremity AVF    Basic Metabolic Panel: Recent Labs  Lab 11/01/19 0405 11/01/19 0405 11/02/19 0558 11/02/19 0558 11/03/19 0606 11/04/19 0404 11/05/19 0454  NA 133*  --  134*  --  133* 138 136  K 4.9  --  4.7  --  5.3* 4.8 5.4*  CL 95*  --  97*  --  98 96* 96*  CO2 25  --  26  --  27 29 29   GLUCOSE 71  --  78  --  79 86 99  BUN 82*  --  65*  --  75* 40* 47*  CREATININE 8.64*  --  6.87*  --  7.28* 4.80* 5.81*  CALCIUM 7.8*   < > 7.9*   < > 8.0* 8.4* 8.8*  MG 2.1  --  2.0  --  2.1 2.0 2.1  PHOS 5.8*  --  5.2*  --  5.4* 4.0 4.8*   < > = values in this interval not displayed.    Liver Function Tests: Recent Labs  Lab 11/01/19 0405 11/02/19 0558 11/03/19 0606 11/04/19 0404 11/05/19 0454  AST 11* 11* 13* 11* 12*  ALT 11 8  9 10 8   ALKPHOS 40 37* 38 42 42  BILITOT 0.7 0.5 0.5 0.3 0.5  PROT 5.9* 5.9* 6.1* 6.4* 6.3*  ALBUMIN 2.6* 2.7* 2.6* 2.8* 2.9*   Recent Labs  Lab 10/29/19 1403  LIPASE 41   No results for input(s): AMMONIA in the last 168 hours.  CBC: Recent Labs  Lab 11/01/19 0405 11/02/19 0558 11/03/19 0606 11/04/19 0404 11/05/19 0454  WBC 9.3 8.0 8.6 10.3 9.5  NEUTROABS 6.1 5.2 5.9 7.6 6.4  HGB 11.0* 11.3* 11.1* 11.5* 11.8*  HCT 33.4* 34.3* 33.5* 33.9* 36.6*  MCV 99.1 99.1 99.1 97.4 100.0  PLT 259 226 245 234 222    Cardiac Enzymes: No results for input(s): CKTOTAL, CKMB, CKMBINDEX, TROPONINI in the last 168 hours.  BNP: Invalid input(s): POCBNP  CBG: Recent Labs  Lab 11/04/19 0827 11/04/19 1207 11/04/19 1616 11/04/19 2115 11/05/19 0751  GLUCAP 100* 78 121* 97 90    Microbiology: Results for orders placed or performed during the hospital encounter of 10/29/19  Body fluid culture     Status: None   Collection Time: 10/29/19 11:52 PM  Specimen: PATH Cytology Pleural fluid  Result Value Ref Range Status   Specimen Description FLUID PLEURAL  Final   Special Requests NONE  Final   Gram Stain NO WBC SEEN NO ORGANISMS SEEN   Final   Culture   Final    NO GROWTH 3 DAYS Performed at [facility], [address] [place], [place] [address]    Report Status 11/02/2019 FINAL  Final    Coagulation Studies: No results for input(s): LABPROT, INR in the last 72 hours.  Urinalysis: No results for input(s): COLORURINE, LABSPEC, PHURINE, GLUCOSEU, HGBUR, BILIRUBINUR, KETONESUR, PROTEINUR, UROBILINOGEN, NITRITE, LEUKOCYTESUR in the last 72 hours.  Invalid input(s): APPERANCEUR    Imaging: DG Chest 1 View  Result Date: 11/04/2019 CLINICAL DATA:  Shortness of breath EXAM: CHEST  1 VIEW COMPARISON:  11/01/2019 FINDINGS: Interval placement of left-sided pleural drainage catheter with significant reduction in the size of a left-sided pleural effusion. Streaky opacity within  the left lung base, which may reflect atelectasis. Known left upper lobe nodule, better seen on recent CT. The right lung appears clear. Heart size remains mildly enlarged. No pneumothorax. IMPRESSION: Interval placement of left-sided pleural drainage catheter with significant reduction in the size of a left-sided pleural effusion. No pneumothorax. Electronically Signed   By: Davina Poke D.O.   On: 11/04/2019 08:04   US Abdomen Limited RUQ  Result Date: 11/04/2019 CLINICAL DATA:  Thickened gallbladder wall on CT. EXAM: ULTRASOUND ABDOMEN LIMITED RIGHT UPPER QUADRANT COMPARISON:  CT 10/30/2019. FINDINGS: Gallbladder: Multiple shadowing echo densities in the gallbladder fossa consistent with gallstones in a contracted gallbladder. Gallbladder wall incompletely evaluated due to shadowing from gallstones. Visualized portion of the gallbladder wall measures 2 mm. Negative Murphy sign. Common bile duct: Diameter: 3 mm Liver: No focal lesion identified. Within normal limits in parenchymal echogenicity. Portal vein is patent on color Doppler imaging with normal direction of blood flow towards the liver. Other: None. IMPRESSION: 1. Findings consistent with multiple gallstones in a contracted gallbladder. Gallbladder wall incompletely evaluated due to shadowing from gallstones. 2.  No evidence of biliary distention.  Liver appears normal. Electronically Signed   By: Marcello Moores  Register   On: 11/04/2019 11:44   IR PERC PLEURAL DRAIN W/INDWELL CATH W/IMG GUIDE  Result Date: 11/03/2019 CLINICAL DATA:  Malignant left pleural effusion. Rapid reaccumulation after large volume left thoracentesis on 10/29/2019. Tunneled pleural drainage catheter placement has been requested. EXAM: INSERTION OF TUNNELED PLEURAL DRAINAGE CATHETER ANESTHESIA/SEDATION: 0.5 mg IV Versed; 25 mcg IV Fentanyl. Total Moderate Sedation Time 12 minutes. The patient's level of consciousness and physiologic status were continuously monitored during the  procedure by Radiology nursing. MEDICATIONS: 600 mg IV clindamycin. Antibiotic was administered in an appropriate time interval for the procedure. FLUOROSCOPY TIME:  No fluoroscopy was utilized. PROCEDURE: The procedure, risks, benefits, and alternatives were explained to the patient. Questions regarding the procedure were encouraged and answered. The patient understands and consents to the procedure. A time-out was performed prior to initiating the procedure. The left lateral chest wall was prepped with chlorhexidine in a sterile fashion, and a sterile drape was applied covering the operative field. A sterile gown and sterile gloves were used for the procedure. Local anesthesia was provided with 1% Lidocaine. Ultrasound image documentation was performed. After creating a small skin incision, a 19 gauge needle was advanced into the pleural cavity under ultrasound guidance. A guide wire was then advanced into the pleural space. Pleural access was dilated serially and a 16-French peel-away sheath  placed. A 15.5 French tunneled PleurX catheter was placed. This was tunneled from an incision 5 cm below the pleural access to the access site. The catheter was advanced through the peel-away sheath. The sheath was then removed. The access incision was closed with subcuticular 4-0 Monocryl. Dermabond was applied to the incision. A Prolene retention suture was applied at the catheter exit site. Large volume thoracentesis was performed through the new catheter utilizing gravity drainage bag. COMPLICATIONS: None. FINDINGS: The catheter was placed via the left lateral chest wall. Approximately 2.4 liters of bloody pleural fluid was able to be removed after catheter placement. IMPRESSION: Placement of permanent, tunneled left pleural drainage catheter via lateral approach. 2.4 liters of pleural fluid was removed today after catheter placement. Electronically Signed   By: Aletta Edouard M.D.   On: 11/03/2019 15:35      Medications:    . escitalopram  10 mg Oral Daily  . heparin  5,000 Units Subcutaneous Q8H  . insulin aspart  0-5 Units Subcutaneous QHS  . insulin aspart  0-6 Units Subcutaneous TID WC  . nicotine  21 mg Transdermal Daily  . tamsulosin  0.4 mg Oral Daily   acetaminophen, ondansetron **OR** ondansetron (ZOFRAN) IV, oxyCODONE, traMADol  Assessment/ Plan:  [name] is a 74 y.o.  male with past medical history of ESRD on HD, diabetes mellitus type 2, history of prostate cancer, history of CVA, peripheral vascular disease, bilateral transmetatarsal amputation, anemia of chronic kidney disease, secondary hyperparathyroidism who was admitted with recurrent left pleural effusion.  [facility]/[address] [facility]/LUE AVF  1.  ESRD on HD TTS.  Plan for hemodialysis today. UF of 3.0L. Patient above outpatient dry weight and has crackles on exam.   2.  Anemia of chronic kidney disease.  Hold Epogen at this time patient has since cancer diagnosis. Hgb 11.8  3.  Secondary hyperparathyroidism.  Phosphorus 4.8 and within acceptable range.Taking calcium acetate.   4.  Left pleural effusion/left upper lobe lung mass.  Recurrent in nature.  Underwent thoracentesis 10/29/2019 removing 2.3 L of bloody fluid.  CT chest now reveals spiculated left upper lobe lung mass.  Further work-up per hospitalist/pulmonology.   LOS: [place] 6/5/20219:26 AM   Patient seen and examined with physician assistant. Agree with above plan.   [name], MD [facility]  6/5/202112:10 PM

## 2019-11-05 NOTE — Evaluation (Signed)
Occupational Therapy Evaluation Patient Details Name: Jose [name] MRN: [id number] DOB: December 01, 1945 Today's Date: 11/05/2019    History of Present Illness Pt admitted for L pleural effusion and is s/p drainage cath on 6/3. Pt with initial complaints of abdominal pain and vomiting. Plans for HD today. History includes ESRD on HF, DM, HTN, prostate cancer, CVA, depression, and glaucoma. Pt with recent admissions for similar symptoms.   Clinical Impression   Jose [name] was seen for OT evaluation this date. Prior to hospital admission, pt was MOD I c RW for mobility and required assist for bathing/LBD. Pt lives c sister and has family available PRN. Pt presents to acute OT demonstrating impaired ADL performance and functional mobility 2/2 decreased LB access, functional strength/ROM deficits, and decreased activity tolerance. Pt currently requires MIN A don/doff socks seated EOB c increased time. SBA + RW tooth brushing and face washing standing sink side. Pt would benefit from skilled OT to address noted impairments and functional limitations (see below for any additional details) in order to maximize safety and independence while minimizing falls risk and caregiver burden. Upon hospital discharge, recommend HHOT to maximize pt safety and return to functional independence during meaningful occupations of daily life.    Follow Up Recommendations  Home health OT    Equipment Recommendations  3 in 1 bedside commode    Recommendations for Other Services       Precautions / Restrictions Precautions Precautions: Fall Restrictions Weight Bearing Restrictions: No      Mobility Bed Mobility Overal bed mobility: Needs Assistance Bed Mobility: Supine to Sit;Sit to Supine     Supine to sit: Supervision Sit to supine: Supervision   General bed mobility comments: Increased time and rails   Transfers Overall transfer level: Needs assistance Equipment used: Rolling walker (2  wheeled) Transfers: Sit to/from Stand Sit to Stand: Min guard         General transfer comment: CGA + RW sit<>stand    Balance Overall balance assessment: Needs assistance Sitting-balance support: Feet supported Sitting balance-Leahy Scale: Good     Standing balance support: Single extremity supported;During functional activity Standing balance-Leahy Scale: Good Standing balance comment: Maintained balance while bending to drink water from sink                            ADL either performed or assessed with clinical judgement   ADL Overall ADL's : Needs assistance/impaired                                       General ADL Comments: MIN A don/doff socks seated EOB c increased time. SBA + RW tooth brushing and face washing standing sink side.      Vision         Perception     Praxis      Pertinent Vitals/Pain Pain Assessment: No/denies pain     Hand Dominance Right   Extremity/Trunk Assessment Upper Extremity Assessment Upper Extremity Assessment: Overall WFL for tasks assessed   Lower Extremity Assessment Lower Extremity Assessment: Generalized weakness       Communication Communication Communication: (Garbled speech)   Cognition Arousal/Alertness: Awake/alert Behavior During Therapy: WFL for tasks assessed/performed Overall Cognitive Status: Within Functional Limits for tasks assessed  General Comments       Exercises Exercises: Other exercises Other Exercises Other Exercises: Pt educated re: OT role, DME recs, d/c recs, falls prevention, energy conservation, RW technique Other Exercises: Grooming, LBD, sup<>sit, sit<>stand, sitting/standinf balance/tolerance   Shoulder Instructions      Home Living Family/patient expects to be discharged to:: Private residence Living Arrangements: Other relatives(lives c sister) Available Help at Discharge:  Family;Friend(s);Available PRN/intermittently Type of Home: House Home Access: Stairs to enter [employer] of Steps: 3 Entrance Stairs-Rails: Can reach both Home Layout: One level     Bathroom Shower/Tub: Teacher, early years/pre: Standard     Home Equipment: Environmental consultant - 2 wheels;Cane - single point;Wheelchair - manual          Prior Functioning/Environment Level of Independence: Needs assistance  Gait / Transfers Assistance Needed: has RW and WC for mobility in the house ADL's / Homemaking Assistance Needed: nephew helps with bathing at sink, dressing, toiletting; able to self feed.    Comments: typically ambulates household distances. Uses RW for all mobility        OT Problem List: Decreased strength;Decreased range of motion;Decreased activity tolerance;Decreased knowledge of use of DME or AE      OT Treatment/Interventions: Self-care/ADL training;Therapeutic exercise;Energy conservation;DME and/or AE instruction;Therapeutic activities;Patient/family education;Balance training    OT Goals(Current goals can be found in the care plan section) Acute Rehab OT Goals Patient Stated Goal: to go home OT Goal Formulation: With patient Time For Goal Achievement: 11/19/19 Potential to Achieve Goals: Good ADL Goals Pt Will Perform Grooming: with modified independence;standing(c LRAD PRN) Pt Will Perform Lower Body Dressing: with modified independence;sit to/from stand(c LRAD PRN) Pt Will Transfer to Toilet: with supervision;ambulating;regular height toilet(c LRAD PRN)  OT Frequency: Min 2X/week   Barriers to D/C: Inaccessible home environment;Decreased caregiver support          Co-evaluation              AM-PAC OT "6 Clicks" Daily Activity     Outcome Measure Help from another person eating meals?: None Help from another person taking care of personal grooming?: None Help from another person toileting, which includes using toliet, bedpan, or  urinal?: A Little Help from another person bathing (including washing, rinsing, drying)?: A Little Help from another person to put on and taking off regular upper body clothing?: None Help from another person to put on and taking off regular lower body clothing?: A Little 6 Click Score: 21   End of Session Equipment Utilized During Treatment: Rolling walker  Activity Tolerance: Patient tolerated treatment well Patient left: in bed;with call bell/phone within reach  OT Visit Diagnosis: Unsteadiness on feet (R26.81);Other abnormalities of gait and mobility (R26.89)                Time: 6811-5726 OT Time Calculation (min): 16 min Charges:  OT General Charges $OT Visit: 1 Visit OT Evaluation $OT Eval Low Complexity: 1 Low OT Treatments $Self Care/Home Management : 8-22 mins  Dessie Coma, M.S. OTR/L  11/05/19, 2:04 PM

## 2019-11-05 NOTE — Progress Notes (Signed)
This note also relates to the following rows which could not be included: Pulse Rate - Cannot attach notes to unvalidated device data Resp - Cannot attach notes to unvalidated device data SpO2 - Cannot attach notes to unvalidated device data  Hd started  

## 2019-11-05 NOTE — Progress Notes (Signed)
SATURATION QUALIFICATIONS: (This note is used to comply with regulatory documentation for home oxygen)  Patient Saturations on Room Air at Rest = 97%  Patient Saturations on Room Air while Ambulating = 95%  Patient Saturations on  Liters of oxygen while Ambulating = %  Please briefly explain why patient needs home oxygen:  Did not place pt on O2 while ambulating due to saturation on Room Air while Ambulating not dropping below 95%.

## 2019-11-05 NOTE — Discharge Summary (Signed)
Physician Discharge Summary  BURDETT PINZON ESP:[id number] DOB: 10/14/1945 DOA: 10/29/2019  PCP: [name], MD  Admit date: 10/29/2019 Discharge date: 11/05/2019  Admitted From: Home Disposition: Home with [facility] PT/OT/RN  Recommendations for Outpatient Follow-up:  1. Follow up with PCP in 1-2 weeks 2. Follow-up with nephrology for continued maintenance of dialysis 3. Follow-up with medical oncology for further work-up and evaluation of metastatic lung cancer 4. Follow-up with pulmonary if necessary 5. Please obtain CMP/CBC in one week 6. Please follow up on the following pending results: CA19-9  Home Health: YES Equipment/Devices: 3in1    Discharge Condition: Stable  CODE STATUS: FULL CODE Diet recommendation: Renal Carb Modified Diet  Brief/Interim Summary: HPI per Dr. Gala Romney on 10/30/19 Jose Faster Thaxtonis a 74 y.o.malewith medical history significant ofend-stage renal disease on hemodialysis TTS, diabetes, hypertension, prostate cancer, CVA, depression, glaucoma and gout with hyperlipidemia who came to the ER with significant shortness of breath. Patient's son gave the history that he was at dialysis and started having abdominal pain and then had vomiting. He did not complete his treatment then. They took him off early and was brought to the ER. He was in the hospital recently and discharged on Thursday. During the hospitalization he was also having significant shortness of breath and cough. He was treated for productive cough and shortness of breath. During that time he did have some watery diarrhea. No fever or chills. He was noted to have turbid urine suspected to have UTI. He was treated for pulmonary edema and acute on chronic diastolic heart failure which resolved. Today however patient was noted to have large left left-sided pleural effusion suspected to be primarily responsible for his symptoms. He had thoracentesis performed tonight with about 2 L of  fluid removed. He is relatively improved but still short of breath. Patient being admitted for further treatment in the hospital..  ED Course:Temperature 97.8 blood pressure 130/72 pulse 89 respiratory of 21 oxygen sat 83% on room air currently 95% on 2 L. White count is 15,000 hemoglobin 12.8 and platelets 279 sodium 139 potassium 3.3 chloride 98. His CO2 is 24 BUN 59 creatinine 6.95 and calcium 8.5. Glucose is 199 INR 1.1. Initial chest x-ray showed progressive left pleural effusion worsening since last week with complete opacification of the left hemithorax. Subsequent ultrasound-guided thoracentesis performed with about 2 L of fluid removed.  **Interim History  Underwent Pleurx catheter placement yesterday after dialysis.  Pulmonary, Interventional radiology and palliative care have all been consulted in addition to nephrology.  Next dialysis session is scheduled for Saturday.  Under went Tunneled Left PleurX Catheter yesterday afternoon and had it drained and had 2 bottles of bloody fluid. He remains on 2 Liters of Supplemental O2 via [place].  Have consulted medical oncology for further work-up and assistance for his likely cancer.  Ordered a ultrasound of the abdomen given the CT findings and it showed a contracted gallbladder with multiple gallstones but was negative for Murphy sign.  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  Dr. Tasia Catchings of oncology discussed this with the patient and he will further work-up the patient up in the outpatient setting and has went to be arranging outpatient MRI as well as possible PET scan.  He is sending  the sample for molecular testing and recommended patient be discharged home and follow-up  closely in outpatient setting.  Patient was dialyzed today and currently stable after dialysis and will be discharged home with home health services.  He did not desaturate and will not need any supplemental oxygen prior to discharge.  Discharge Diagnoses:  Principal Problem:   Pleural effusion on left Active Problems:   Type II diabetes mellitus with renal manifestations (HCC)   Hypertension   ESRD on dialysis (HCC)   Tobacco abuse   Acute on chronic diastolic CHF (congestive heart failure) (HCC)   SOB (shortness of breath)   Hypokalemia   Chronic diastolic CHF (congestive heart failure) (HCC)   Benign essential HTN   Malignant neoplasm of unknown origin ([place])   Acute respiratory failure with hypoxia (HCC)   End-stage renal disease on hemodialysis ([place])   Diabetes mellitus type 2, controlled, with complications ([place])   Primary adenocarcinoma of left lung (HCC)   Thickening of wall of gallbladder  Chronic Diastolic CHF -C/w Strict I's and O's and Daily Weights;  -Question of weight is accurate as his weight went from 120 pounds to 165 pounds -Currently patient's BP controlled without medication monitor closely -Need to monitor for signs and symptoms of volume overload and he is usually dialyzed for volume maintenance given his ESRD; Underwent Dialysis  today  Essential HTN -Blood pressure is relatively stable and last blood pressure reading was 143/69 -Continue to monitor blood pressures per protocol -Currently not on any antihypertensives and will discontinue them all at discharge  Metastatic primary lung adenocarcinoma  -CT abdomen pelvis showed masslike thickening of gallbladder; the CT scan showed an irregular and mass like thickening of the gallbladder wall concerning for primary neoplasm or metastatic disease and recommended initial evaluation with ultrasound and there is also extensive colonic diverticulosis and no bowel obstruction.  There is also aortic atherosclerosis  and at 3 cm infrarenal abdominal aortic aneurysm noted  -Ultrasound of the gallbladder done today cystic with multiple gallstones and a contracted gallbladder.  The gallbladder is incompletely evaluated due to shadowing from the gallstones and there is no evidence of any biliary distention and the liver appeared normal -CT of the chest showed a spiculated cavitated mass in the left upper lobe highly suspicious for malignancy with a linear opacity extending off the inferior mass which is likely atelectasis.  There is also a large left pleural effusion extends to the pleural space associated with pleural thickening fluid -Pulmonary consulted and they have discussed endobronchial ultrasound assisted lymph node biopsies and patient is agreeable but this has not been done yet -Medical oncology consulted and appreciate further evaluation -Cytology from the thoracentesis done on 10/29/2018 and confirms adenocarcinoma of the lung -Will get PT/OT to further evaluate and Treat and recommending home health  RecurrentLEFT-sided pleural effusion -So far analysis showed transudate. Awaiting full chemistry as well as Gram stain and culture. Suspected all due to anasarca from renal failure. Will need to monitor closely -5/29LEFT thoracentesisaspirated 2.3 L bloody fluid. -5/30 CT chest showed spiculated cavitated mass in the upper left lobe which is highly suspicious for malignancy.  There is a linear opacity extending off the inferior mass that is likely atelectasis.  The mass extends into the left pleural surface with mild associated pleural thickening fluid.  The large left pleural effusion is likely malignant and he does have adenopathy in the mediastinum worrisome for metastatic disease. -He will need a PET CT scan to further evaluate in the  outpatient setting next -5/30 Dr. Sherral Hammers spoke with [facility] stateed fluid was sent off for pathology/cytology to [facility] earliest we would hear back any  results is Tuesday. -5/31 discussed case with Dr Ottie Glazier PCCM who concurs that patient most likely has cancer. Per Dr. Madelin Rear Discusscion he would like to defer EBUS until results of thoracentesis return. -6/1 PCXR showed "Rounded airspace opacities/nodule seen within the left upper lung as on recent CT. Large left pleural effusion/consolidation" -Again the cytology/pathology report has not returned from thoracentesis conducted on 5/29 -6/1 have requested IR drainage of recurrent LEFT pleural effusion with placement of Pleurx cath; This was done 6/3 after dialysis  -Resume Plavix that he is no longer going to get an EBUS done -Pulmonary is consulted for further evaluation and discussed endobronchial ultrasound assisted lymph node biopsies and patient was agreeable however we will cancel this given that we do not have a diagnosis of malignant stage IV lung adenocarcinoma -Resume sq Heparin and will resume Plavix -Continue supplemental oxygen via nasal cannula as below and has been weaned off oxygen -Medical oncology consulted in the a.m. ordered a CEA and CA 19-9.  Dr. Tasia Catchings discussed the cancer diagnosis with the patient and recommends discharging and having the patient complete his work-up as an outpatient with an MRI and possible PET scan.  Dr. Tasia Catchings will follow the patient closely and he is stable for discharge as PT OT recommending home health  Acute Respiratory Failure with Hypoxia -Per patient has had multiple episodes of pleural effusions requiring draining, never worked up. -See recurrent pleural effusion as above -Continue with supplemental oxygen via nasal cannula and wean O2 as tolerated -SpO2: 99 % O2 Flow Rate (L/min): 2 L/min; was weaned off of oxygen and he did not desaturate on amatory home O2 screen -Continuous pulse oximetry and maintain O2 saturations greater than 92% -Repeat chest x-ray in the a.m now that [place] is placed; Showed "Interval placement of left-sided pleural  drainage catheter with significant reduction in the size of a left-sided pleural effusion. No pneumothorax." -Follow-up with pulmonary if necessary in the outpatient setting and repeat chest x-ray in 3 to 6 weeks  ESRD on HD TTS, Hyperphosphatemia -Per EMR partial session of HD on Thursday.  -BUN/creatinine went from 82/8.61 -> 65/6.87 -> 75/7.28 -> 40/4.80 -> 47/521 -Will be dialyzed again in the a.m nephrology feels that he has no acute indication for dialysis today  Anemia of Chronic Kidney Disease -Initially held his Plavix but will resume at discharge -Patient's hemoglobin/hematocrit is relatively stable and is now 11.8/36.6 -Check anemia panel in the a.m. in outpatient setting -Continue to monitor for signs or symptoms of bleeding; currently no overt bleeding noted -CBC within 1 week -Nephrology recommending to hold Epogen at this time due to his likely cancer diagnosis  DM type II controlled with complication -2/94 hemoglobin A1c= 5.6 -Ultrasensitive SSI; CBGs have been on the lower side given that he has been n.p.o. and has CBGs ranging from  78-1 28 -We will discontinue his glipizide at discharge  Hyponatremia -Mild with a sodium of 133 and improved to 139 -Continue to monitor and trend and repeat CMP in the a.m. -Correct with dialysis  Hyperkalemia -Patient's potassium was  5.4 today -Likely to be corrected in dialysis -Continue monitor and trend and repeat CMP in the a.m.  Goals of Care -5/31 Palliative Care Consult; -Appreciate assistance for [place] Discussion and this will need to continue in outpatient setting  Discharge Instructions  Discharge Instructions  Call MD for:  difficulty breathing, headache or visual disturbances   Complete by: As directed    Call MD for:  extreme fatigue   Complete by: As directed    Call MD for:  hives   Complete by: As directed    Call MD for:  persistant dizziness or light-headedness   Complete by: As directed     Call MD for:  persistant nausea and vomiting   Complete by: As directed    Call MD for:  redness, tenderness, or signs of infection (pain, swelling, redness, odor or green/yellow discharge around incision site)   Complete by: As directed    Call MD for:  severe uncontrolled pain   Complete by: As directed    Call MD for:  temperature >100.4   Complete by: As directed    Diet - low sodium heart healthy   Complete by: As directed    RENAL CARB MODIFIED DIET With 1200 mL Fluid Restriction   Discharge instructions   Complete by: As directed    You were cared for by a hospitalist during your hospital stay. If you have any questions about your discharge medications or the care you received while you were in the hospital after you are discharged, you can call the unit and ask to speak with the hospitalist on call if the hospitalist that took care of you is not available. Once you are discharged, your primary care physician will handle any further medical issues. Please note that NO REFILLS for any discharge medications will be authorized once you are discharged, as it is imperative that you return to your primary care physician (or establish a relationship with a primary care physician if you do not have one) for your aftercare needs so that they can reassess your need for medications and monitor your lab values.  Follow up with PCP, Nephrology, Pulmonary, and Medical Oncology. Take all medications as prescribed. If symptoms change or worsen please return to the ED for evaluation   Increase activity slowly   Complete by: As directed    No wound care   Complete by: As directed      Allergies as of 11/05/2019      Reactions   Penicillin G Other (See Comments)   Other reaction(s): Unknown DID THE REACTION INVOLVE: Swelling of the face/tongue/throat, SOB, or low BP? Unknown Sudden or severe rash/hives, skin peeling, or the inside of the mouth or nose? Unknown Did it require medical treatment?  Unknown When did it last happen?unknown If all above answers are "NO", may proceed with cephalosporin use.      Medication List    STOP taking these medications   amLODipine 10 MG tablet Commonly known as: NORVASC   cloNIDine 0.2 MG tablet Commonly known as: CATAPRES   glipiZIDE 5 MG tablet Commonly known as: GLUCOTROL   hydrALAZINE 25 MG tablet Commonly known as: APRESOLINE     TAKE these medications   allopurinol 100 MG tablet Commonly known as: ZYLOPRIM Take 1 tablet (100 mg total) by mouth daily.   aspirin 81 MG EC tablet Take 1 tablet (81 mg total) by mouth daily.   atorvastatin 10 MG tablet Commonly known as: LIPITOR Take 1 tablet (10 mg total) by mouth daily at 6 PM.   calcium acetate 667 MG capsule Commonly known as: PHOSLO Take 2 capsules (1,334 mg total) by mouth 3 (three) times daily with meals.   clopidogrel 75 MG tablet Commonly known as: PLAVIX Take 1 tablet (75  mg total) by mouth daily.   escitalopram 10 MG tablet Commonly known as: Lexapro Take 1 tablet (10 mg total) by mouth daily.   nicotine 21 mg/24hr patch Commonly known as: NICODERM CQ - dosed in mg/24 hours Place 1 patch (21 mg total) onto the skin daily. Start taking on: November 06, 2019   ondansetron 4 MG tablet Commonly known as: ZOFRAN Take 1 tablet (4 mg total) by mouth every 6 (six) hours as needed for nausea.   oxyCODONE 5 MG immediate release tablet Commonly known as: Oxy IR/ROXICODONE Take 1 tablet (5 mg total) by mouth every 6 (six) hours as needed for severe pain.   tamsulosin 0.4 MG Caps capsule Commonly known as: FLOMAX Take 1 capsule (0.4 mg total) by mouth daily.   traMADol 50 MG tablet Commonly known as: ULTRAM 50 mg.            Durable Medical Equipment  (From admission, onward)         Start     Ordered   11/05/19 1608  DME Oxygen  Once    Comments: ONLY IF PATIENT DESATURATES ON ROOM AIR AMBULATING <88%. IF he does not please notify Provider and order  will be cancelled.  Question Answer Comment  Length of Need Lifetime   Mode or (Route) Nasal cannula   Liters per Minute 2   Frequency Continuous (stationary and portable oxygen unit needed)   Oxygen delivery system Gas      11/05/19 1607   11/05/19 1329  DME 3-in-1  Once     11/05/19 1335         Follow-up Information    [name], MD Follow up in 1 week(s).   Specialty: Family Medicine Contact information: [address] [place] [address] [phone]          Allergies  Allergen Reactions  . Penicillin G Other (See Comments)    Other reaction(s): Unknown DID THE REACTION INVOLVE: Swelling of the face/tongue/throat, SOB, or low BP? Unknown Sudden or severe rash/hives, skin peeling, or the inside of the mouth or nose? Unknown Did it require medical treatment? Unknown When did it last happen?unknown If all above answers are "NO", may proceed with cephalosporin use.    Consultations:  Nephrology  Pulmonary  Interventional Radiology   Palliative Care Medicine   Medical Oncology   Procedures/Studies: CT ABDOMEN PELVIS WO CONTRAST  Result Date: 10/30/2019 CLINICAL DATA:  74 year old male with history of prostate cancer. Left upper lobe mass seen on earlier CT. EXAM: CT ABDOMEN AND PELVIS WITHOUT CONTRAST TECHNIQUE: Multidetector CT imaging of the abdomen and pelvis was performed following the standard protocol without IV contrast. COMPARISON:  Chest CT dated 10/30/2019. FINDINGS: Evaluation of this exam is limited in the absence of intravenous contrast. Lower chest: Please see report for chest CT of 10/30/2019. No intra-abdominal free air or free fluid. Hepatobiliary: The liver is grossly unremarkable. No intrahepatic biliary ductal dilatation. There is irregular and masslike thickening of the gallbladder wall concerning for primary neoplasm or metastatic disease. Further initial evaluation with ultrasound recommended. MRI may provide better evaluation  depending on the ultrasound findings. No calcified gallstone or pericholecystic fluid. Pancreas: Unremarkable. No pancreatic ductal dilatation or surrounding inflammatory changes. Spleen: Normal in size without focal abnormality. Adrenals/Urinary Tract: Indeterminate bilateral adrenal thickening or hyperplasia. Moderate bilateral renal parenchyma atrophy. There is a 15 mm left renal hypodense lesion which demonstrates fluid attenuation, likely a cyst. Additional smaller hypodense lesions are too small to characterize.  There is no hydronephrosis on either side. The visualized ureters appear unremarkable. Excreted contrast from recent CT is noted within the bladder. Stomach/Bowel: There is extensive sigmoid diverticulosis and scattered colonic diverticula without active inflammatory changes. There is no bowel obstruction or active inflammation. The appendix is normal. Vascular/Lymphatic: Advanced aortoiliac atherosclerotic disease. Diffuse ectasia of the abdominal aorta with borderline aneurysmal dilatation of the infrarenal abdominal aorta measuring up to 3 cm in diameter. The IVC is unremarkable. Bilateral common iliac artery stents noted. No portal venous gas. There is no adenopathy. Reproductive: Prostate brachytherapy seeds. Other: Paucity of subcutaneous fat. Musculoskeletal: Degenerative changes of the spine with findings of renal osteodystrophy. No acute osseous pathology. Mild bilateral femoral head avascular necrosis. IMPRESSION: 1. Irregular and masslike thickening of the gallbladder wall concerning for primary neoplasm or metastatic disease. Further initial evaluation with ultrasound recommended. 2. Extensive colonic diverticulosis. No bowel obstruction. Normal appendix. 3. Aortic Atherosclerosis (ICD10-I70.0). 4. A 3 cm infrarenal abdominal aortic aneurysm. Recommend followup by ultrasound in 3 years. This recommendation follows ACR consensus guidelines: White Paper of the ACR Incidental Findings  Committee II on Vascular Findings. J Am Coll Radiol 2013; 10:789-794. Aortic aneurysm NOS (ICD10-I71.9) Electronically Signed   By: Anner Crete M.D.   On: 10/30/2019 19:40   DG Chest 1 View  Result Date: 11/04/2019 CLINICAL DATA:  Shortness of breath EXAM: CHEST  1 VIEW COMPARISON:  11/01/2019 FINDINGS: Interval placement of left-sided pleural drainage catheter with significant reduction in the size of a left-sided pleural effusion. Streaky opacity within the left lung base, which may reflect atelectasis. Known left upper lobe nodule, better seen on recent CT. The right lung appears clear. Heart size remains mildly enlarged. No pneumothorax. IMPRESSION: Interval placement of left-sided pleural drainage catheter with significant reduction in the size of a left-sided pleural effusion. No pneumothorax. Electronically Signed   By: Davina Poke D.O.   On: 11/04/2019 08:04   CT CHEST W CONTRAST  Result Date: 10/30/2019 CLINICAL DATA:  Left pleural effusion. Thoracentesis yesterday demonstrated bloody fluid. Malignancy suspected. EXAM: CT CHEST WITH CONTRAST TECHNIQUE: Multidetector CT imaging of the chest was performed during intravenous contrast administration. CONTRAST:  87mL OMNIPAQUE IOHEXOL 300 MG/ML  SOLN COMPARISON:  Recent chest x-rays FINDINGS: Cardiovascular: Atherosclerotic changes are seen in the thoracic aorta. The ascending thoracic aorta measures up to 4.1 cm, mildly aneurysmal. The central pulmonary arteries are normal. Coronary artery calcifications involving the right and left coronary arteries. The heart is unremarkable. Mediastinum/Nodes: There is a mildly prominent node in the base of the neck on the left measuring 9 mm in short axis on series 2, image 14, asymmetric to the right. There is adenopathy with a node measuring 13 mm in short axis in the prevascular space. There is a prominent node in the AP window measuring 15 mm on series 2, image 56. No subcarinal adenopathy. No  right-sided adenopathy identified. There is a moderate to large left effusion and a tiny right effusion. No pericardial effusion identified. The thyroid is unremarkable. The esophagus is normal. Lungs/Pleura: There is a spiculated mass in the left upper lobe with central cavitation. Platelike opacity extends off the inferior aspect of the mass, likely associated atelectasis. The mass extends to the pleural surface with some adjacent pleural fluid and pleural thickening. The mass appears to measure 3.2 by 1.7 by 1.6 cm in craniocaudal, transverse, and AP dimensions. There is a moderate left-sided pleural effusion with underlying opacity, likely atelectasis. No centrally obstructing mass is identified on the  left. There is a small right effusion with associated atelectasis. Emphysematous changes are seen in the lungs. Upper Abdomen: Visualized liver and gallbladder are normal. The right kidney is relatively atrophic. The left kidney is not visualized. The left adrenal gland is mildly prominent as seen on series 2, image 162 but not fully evaluated. No other abnormalities in the upper abdomen. Musculoskeletal: No chest wall abnormality. No acute or significant osseous findings. IMPRESSION: 1. Spiculated cavitated mass in the left upper lobe is above highly suspicious for malignancy. Linear opacity extending off the inferior mass is likely atelectasis. The mass extends to the left pleural surface with mild associated pleural thickening and fluid. Given history, the large left pleural effusion is likely malignant. 2. Adenopathy in the mediastinum worrisome for metastatic disease. A PET-CT could better evaluate. 3. 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 4. Mildly prominent left adrenal gland, incompletely evaluated. Recommend attention on PET-CT. 5. Coronary artery calcifications. 6. Atelectasis in the bases. 7. Emphysematous changes in the lungs. Aortic Atherosclerosis (ICD10-I70.0)  and Emphysema (ICD10-J43.9). Electronically Signed   By: Dorise Bullion III M.D   On: 10/30/2019 10:57   US Pelvis Limited  Result Date: 10/22/2019 CLINICAL DATA:  74 year old male with a history of urinary frequency EXAM: LIMITED ULTRASOUND OF PELVIS TECHNIQUE: Limited transabdominal ultrasound examination of the pelvis was performed. COMPARISON:  None. FINDINGS: Urinary bladder measures 440 cc prevoid, 403 cc post void. Prostate volume estimated 15 cc. IMPRESSION: Significant post void residual indicating urinary retention. Prostate volume estimated 15 cc. Electronically Signed   By: Corrie Mckusick D.O.   On: 10/22/2019 12:24   DG Chest Port 1 View  Result Date: 11/01/2019 CLINICAL DATA:  Pleural effusion EXAM: PORTABLE CHEST 1 VIEW COMPARISON:  Oct 30, 2019 FINDINGS: The heart size and mediastinal contours ar unchanged with cardiomegaly which is partially obscured. Again noted is a peripherally based rounded airspace opacity in the left upper lung. A large left effusion/consolidation is seen. The right lung is clear. No acute osseous abnormality. IMPRESSION: Rounded airspace opacities/nodule seen within the left upper lung as on recent CT. Large left pleural effusion/consolidation Electronically Signed   By: Prudencio Pair M.D.   On: 11/01/2019 05:42   DG Chest Port 1 View  Result Date: 10/29/2019 CLINICAL DATA:  74 year old male status post thoracentesis. EXAM: PORTABLE CHEST 1 VIEW COMPARISON:  Earlier radiograph dated 10/29/2019. FINDINGS: Interval decrease in the size of the left pleural effusion with moderate residual effusion. There is aerated portion of the left upper lobe. The right lung is clear. No pneumothorax. Stable cardiomediastinal silhouette. No acute osseous pathology. IMPRESSION: Interval decrease in the size of the left pleural effusion with moderate residual effusion. No pneumothorax. Electronically Signed   By: Anner Crete M.D.   On: 10/29/2019 23:34   DG Chest Port 1  View  Result Date: 10/29/2019 CLINICAL DATA:  Dyspnea. Abdominal pain and vomiting while at dialysis today, unable to get complete treatment. EXAM: PORTABLE CHEST 1 VIEW COMPARISON:  Radiograph 10/22/2019 FINDINGS: Progressive left pleural effusion with near complete opacification of the left hemithorax, small portion of left apical lung is aerated. There is rightward mediastinal shift suggesting large fluid volume rather than compressive atelectasis. Mediastinal contours are obscured by left lung opacity. Probable right perihilar atelectasis. No significant right pleural effusion. No pneumothorax. IMPRESSION: Progressive left pleural effusion over the past week with near complete opacification of the left hemithorax. Rightward mediastinal shift suggesting large fluid volume volume rather than passive  atelectasis. Electronically Signed   By: Keith Rake M.D.   On: 10/29/2019 20:45   DG Chest Portable 1 View  Result Date: 10/22/2019 CLINICAL DATA:  Productive cough for 4 days with congestion and watery eyes. Current smoker. History of diabetes. EXAM: PORTABLE CHEST 1 VIEW COMPARISON:  Chest radiographs 06/12/2018 and 03/21/2018. FINDINGS: 1141 hours. Dialysis catheter has been removed in the interval. The visualized heart size and mediastinal contours are stable with mild aortic atherosclerosis. There is a new moderate-sized left pleural effusion with associated left basilar pulmonary opacity. No significant right pleural effusion. No pneumothorax. The pulmonary vasculature is slightly indistinct, and there may be mild pulmonary edema. No significant osseous findings. IMPRESSION: New moderate-sized left pleural effusion with associated left basilar atelectasis or infiltrate. Possible mild pulmonary edema. Electronically Signed   By: Richardean Sale M.D.   On: 10/22/2019 12:18   US Abdomen Limited RUQ  Result Date: 11/04/2019 CLINICAL DATA:  Thickened gallbladder wall on CT. EXAM: ULTRASOUND ABDOMEN  LIMITED RIGHT UPPER QUADRANT COMPARISON:  CT 10/30/2019. FINDINGS: Gallbladder: Multiple shadowing echo densities in the gallbladder fossa consistent with gallstones in a contracted gallbladder. Gallbladder wall incompletely evaluated due to shadowing from gallstones. Visualized portion of the gallbladder wall measures 2 mm. Negative Murphy sign. Common bile duct: Diameter: 3 mm Liver: No focal lesion identified. Within normal limits in parenchymal echogenicity. Portal vein is patent on color Doppler imaging with normal direction of blood flow towards the liver. Other: None. IMPRESSION: 1. Findings consistent with multiple gallstones in a contracted gallbladder. Gallbladder wall incompletely evaluated due to shadowing from gallstones. 2.  No evidence of biliary distention.  Liver appears normal. Electronically Signed   By: Marcello Moores  Register   On: 11/04/2019 11:44   IR PERC PLEURAL DRAIN W/INDWELL CATH W/IMG GUIDE  Result Date: 11/03/2019 CLINICAL DATA:  Malignant left pleural effusion. Rapid reaccumulation after large volume left thoracentesis on 10/29/2019. Tunneled pleural drainage catheter placement has been requested. EXAM: INSERTION OF TUNNELED PLEURAL DRAINAGE CATHETER ANESTHESIA/SEDATION: 0.5 mg IV Versed; 25 mcg IV Fentanyl. Total Moderate Sedation Time 12 minutes. The patient's level of consciousness and physiologic status were continuously monitored during the procedure by Radiology nursing. MEDICATIONS: 600 mg IV clindamycin. Antibiotic was administered in an appropriate time interval for the procedure. FLUOROSCOPY TIME:  No fluoroscopy was utilized. PROCEDURE: The procedure, risks, benefits, and alternatives were explained to the patient. Questions regarding the procedure were encouraged and answered. The patient understands and consents to the procedure. A time-out was performed prior to initiating the procedure. The left lateral chest wall was prepped with chlorhexidine in a sterile fashion, and a  sterile drape was applied covering the operative field. A sterile gown and sterile gloves were used for the procedure. Local anesthesia was provided with 1% Lidocaine. Ultrasound image documentation was performed. After creating a small skin incision, a 19 gauge needle was advanced into the pleural cavity under ultrasound guidance. A guide wire was then advanced into the pleural space. Pleural access was dilated serially and a 16-French peel-away sheath placed. A 15.5 French tunneled PleurX catheter was placed. This was tunneled from an incision 5 cm below the pleural access to the access site. The catheter was advanced through the peel-away sheath. The sheath was then removed. The access incision was closed with subcuticular 4-0 Monocryl. Dermabond was applied to the incision. A Prolene retention suture was applied at the catheter exit site. Large volume thoracentesis was performed through the new catheter utilizing gravity drainage bag. COMPLICATIONS: None. FINDINGS:  The catheter was placed via the left lateral chest wall. Approximately 2.4 liters of bloody pleural fluid was able to be removed after catheter placement. IMPRESSION: Placement of permanent, tunneled left pleural drainage catheter via lateral approach. 2.4 liters of pleural fluid was removed today after catheter placement. Electronically Signed   By: Aletta Edouard M.D.   On: 11/03/2019 15:35   US THORACENTESIS ASP PLEURAL SPACE W/IMG GUIDE  Result Date: 10/29/2019 INDICATION: 74 year old with shortness of breath and large left pleural effusion. EXAM: ULTRASOUND GUIDED LEFT THORACENTESIS MEDICATIONS: None. COMPLICATIONS: None immediate. PROCEDURE: An ultrasound guided thoracentesis was thoroughly discussed with the patient and questions answered. The benefits, risks, alternatives and complications were also discussed. The patient understands and wishes to proceed with the procedure. Written consent was obtained. Ultrasound was performed to  localize and mark an adequate pocket of fluid in the left chest. The area was then prepped and draped in the normal sterile fashion. 1% Lidocaine was used for local anesthesia. Under ultrasound guidance a 6 Fr Safe-T-Centesis catheter was introduced. Thoracentesis was performed. The catheter was removed and a dressing applied. FINDINGS: A total of approximately 2.35 L of bloody fluid was removed. Samples were sent to the laboratory as requested by the clinical team. IMPRESSION: Successful ultrasound guided left thoracentesis yielding 2.35 L of pleural fluid. Electronically Signed   By: Markus Daft M.D.   On: 10/29/2019 23:26      Subjective: Seen and examined in dialysis and states that he is doing okay.  Denies any complaints.  No nausea or vomiting.  Has been weaned off oxygen.  Understands that he is cancer after Dr. Dennis Bast talk to him extensively.  Is stable for discharge and will need to follow-up with PCP, medical oncology as well as nephrology in outpatient setting.  Discharge Exam: Vitals:   11/05/19 1400 11/05/19 1445  BP: 120/75 131/66  Pulse: 84 87  Resp: 17 16  Temp: 98.8 F (37.1 C) 98.8 F (37.1 C)  SpO2:  99%   Vitals:   11/05/19 1330 11/05/19 1345 11/05/19 1400 11/05/19 1445  BP: 98/63 111/69 120/75 131/66  Pulse: 89 89 84 87  Resp: 16 15 17 16   Temp:   98.8 F (37.1 C) 98.8 F (37.1 C)  TempSrc:    Oral  SpO2:    99%  Weight:      Height:       General: Pt is alert, awake, not in acute distress Cardiovascular: RRR, S1/S2 +, no rubs, no gallops Respiratory: Diminished bilaterally, no wheezing, no rhonchi; has a left Pleurx catheter in place Abdominal: Soft, NT, ND, bowel sounds + Extremities: Trace edema, no cyanosis  The results of significant diagnostics from this hospitalization (including imaging, microbiology, ancillary and laboratory) are listed below for reference.    Microbiology: Recent Results (from the past 240 hour(s))  Body fluid culture     Status:  None   Collection Time: 10/29/19 11:52 PM   Specimen: PATH Cytology Pleural fluid  Result Value Ref Range Status   Specimen Description FLUID PLEURAL  Final   Special Requests NONE  Final   Gram Stain NO WBC SEEN NO ORGANISMS SEEN   Final   Culture   Final    NO GROWTH 3 DAYS Performed at [facility], [address], [place], [place] [address]    Report Status 11/02/2019 FINAL  Final    Labs: BNP (last 3 results) Recent Labs    10/29/19 2021  BNP 122.5*  Basic Metabolic Panel: Recent Labs  Lab 11/01/19 0405 11/02/19 0558 11/03/19 0606 11/04/19 0404 11/05/19 0454  NA 133* 134* 133* 138 136  K 4.9 4.7 5.3* 4.8 5.4*  CL 95* 97* 98 96* 96*  CO2 25 26 27 29 29   GLUCOSE 71 78 79 86 99  BUN 82* 65* 75* 40* 47*  CREATININE 8.64* 6.87* 7.28* 4.80* 5.81*  CALCIUM 7.8* 7.9* 8.0* 8.4* 8.8*  MG 2.1 2.0 2.1 2.0 2.1  PHOS 5.8* 5.2* 5.4* 4.0 4.8*   Liver Function Tests: Recent Labs  Lab 11/01/19 0405 11/02/19 0558 11/03/19 0606 11/04/19 0404 11/05/19 0454  AST 11* 11* 13* 11* 12*  ALT 11 8 9 10 8   ALKPHOS 40 37* 38 42 42  BILITOT 0.7 0.5 0.5 0.3 0.5  PROT 5.9* 5.9* 6.1* 6.4* 6.3*  ALBUMIN 2.6* 2.7* 2.6* 2.8* 2.9*   No results for input(s): LIPASE, AMYLASE in the last 168 hours. No results for input(s): AMMONIA in the last 168 hours. CBC: Recent Labs  Lab 11/01/19 0405 11/02/19 0558 11/03/19 0606 11/04/19 0404 11/05/19 0454  WBC 9.3 8.0 8.6 10.3 9.5  NEUTROABS 6.1 5.2 5.9 7.6 6.4  HGB 11.0* 11.3* 11.1* 11.5* 11.8*  HCT 33.4* 34.3* 33.5* 33.9* 36.6*  MCV 99.1 99.1 99.1 97.4 100.0  PLT 259 226 245 234 222   Cardiac Enzymes: No results for input(s): CKTOTAL, CKMB, CKMBINDEX, TROPONINI in the last 168 hours. BNP: Invalid input(s): POCBNP CBG: Recent Labs  Lab 11/04/19 1207 11/04/19 1616 11/04/19 2115 11/05/19 0751 11/05/19 1552  GLUCAP 78 121* 97 90 128*   D-Dimer No results for input(s): DDIMER in the last 72 hours. Hgb A1c No results for  input(s): HGBA1C in the last 72 hours. Lipid Profile No results for input(s): CHOL, HDL, LDLCALC, TRIG, CHOLHDL, LDLDIRECT in the last 72 hours. Thyroid function studies No results for input(s): TSH, T4TOTAL, T3FREE, THYROIDAB in the last 72 hours.  Invalid input(s): FREET3 Anemia work up No results for input(s): VITAMINB12, FOLATE, FERRITIN, TIBC, IRON, RETICCTPCT in the last 72 hours. Urinalysis    Component Value Date/Time   COLORURINE YELLOW (A) 10/22/2019 1218   APPEARANCEUR TURBID (A) 10/22/2019 1218   LABSPEC 1.014 10/22/2019 1218   PHURINE 6.0 10/22/2019 1218   GLUCOSEU NEGATIVE 10/22/2019 1218   HGBUR SMALL (A) 10/22/2019 1218   BILIRUBINUR NEGATIVE 10/22/2019 [place] 10/22/2019 1218   PROTEINUR 100 (A) 10/22/2019 1218   NITRITE NEGATIVE 10/22/2019 1218   LEUKOCYTESUR LARGE (A) 10/22/2019 1218   Sepsis Labs Invalid input(s): PROCALCITONIN,  WBC,  LACTICIDVEN Microbiology Recent Results (from the past 240 hour(s))  Body fluid culture     Status: None   Collection Time: 10/29/19 11:52 PM   Specimen: PATH Cytology Pleural fluid  Result Value Ref Range Status   Specimen Description FLUID PLEURAL  Final   Special Requests NONE  Final   Gram Stain NO WBC SEEN NO ORGANISMS SEEN   Final   Culture   Final    NO GROWTH 3 DAYS Performed at [facility], [place] [address] [place], [place] [address]    Report Status 11/02/2019 FINAL  Final   Time coordinating discharge: 35 minutes  SIGNED:  Kerney Elbe, DO [facility] 11/05/2019, 8:00 PM Pager is on AMION  If 7PM-7AM, please contact night-coverage [email or web address]

## 2019-11-05 NOTE — Progress Notes (Signed)
MD order received in [facility] to discharge pt home with Home Health PT and OT; TOC previously established Home Health PT and OT with [place]; DME/3 in 1 [facility] previously delivered to the pt's room in order for him to take home at discharge; pt did not qualify for home oxygen this shift; verbally reviewed the AVS with pt, no questions voiced at this time; pt discharged via wheelchair to the [facility] entrance by nursing to his ride who is waiting at the entrance; DME/BSC taken with the pt

## 2019-11-05 NOTE — Progress Notes (Signed)
Hematology/Oncology Progress Note [facility] [phone] [phone]  Patient Care Team: [name], MD as PCP - General (Family Medicine)   Name of the patient: Jose [name]  [id number]  1945/06/26  Date of visit: 11/05/19   INTERVAL HISTORY-  Patient was seen during his dialysis session. He reports feeling well today.  Breathing well.   Review of systems- Review of Systems  Constitutional: Positive for fatigue. Negative for chills and fever.  HENT:   Negative for hearing loss and voice change.   Eyes: Negative for eye problems and icterus.  Respiratory: Negative for chest tightness, cough and shortness of breath.   Cardiovascular: Negative for chest pain and leg swelling.  Gastrointestinal: Negative for abdominal distention and abdominal pain.  Endocrine: Negative for hot flashes.  Genitourinary: Negative for difficulty urinating, dysuria and frequency.   Musculoskeletal: Negative for arthralgias.  Skin: Negative for itching and rash.  Neurological: Negative for light-headedness and numbness.  Hematological: Negative for adenopathy. Does not bruise/bleed easily.  Psychiatric/Behavioral: Negative for confusion.    Allergies  Allergen Reactions  . Penicillin G Other (See Comments)    Other reaction(s): Unknown DID THE REACTION INVOLVE: Swelling of the face/tongue/throat, SOB, or low BP? Unknown Sudden or severe rash/hives, skin peeling, or the inside of the mouth or nose? Unknown Did it require medical treatment? Unknown When did it last happen?unknown If all above answers are "NO", may proceed with cephalosporin use.     Patient Active Problem List   Diagnosis Date Noted  . Primary adenocarcinoma of left lung ([place])   . Hypokalemia 10/30/2019  . Chronic diastolic CHF (congestive heart failure) ([place]) 10/30/2019  . Benign essential HTN 10/30/2019  . Malignant neoplasm of unknown origin ([place]) 10/30/2019  . Acute  respiratory failure with hypoxia ([place]) 10/30/2019  . End-stage renal disease on hemodialysis ([place]) 10/30/2019  . Diabetes mellitus type 2, controlled, with complications ([place]) 60/63/0160  . SOB (shortness of breath) 10/29/2019  . Pulmonary edema 10/22/2019  . Diarrhea 10/22/2019  . Acute on chronic diastolic CHF (congestive heart failure) ([place]) 10/22/2019  . HLD (hyperlipidemia)   . Stroke ([place])   . Depression   . Tobacco abuse   . Pleural effusion on left   . Acute urinary retention   . Foot ulcer ([place]) 12/15/2018  . Ischemic foot 10/11/2018  . Atherosclerosis of native arteries of the extremities with gangrene ([place]) 07/09/2018  . Gangrene of toe of right foot ([place]) 06/13/2018  . Type II diabetes mellitus with renal manifestations ([place]) 04/16/2018  . Hypertension 04/16/2018  . ESRD on dialysis ([place]) 04/16/2018  . Malnutrition of moderate degree 03/27/2018  . Renal failure 03/21/2018     Past Medical History:  Diagnosis Date  . Chronic kidney disease   . Depression   . Diabetes mellitus without complication ([place])   . Glaucoma   . Glaucoma   . Gout   . Heart murmur   . HLD (hyperlipidemia)   . Hypertension   . Prostate cancer ([place])   . Renal failure    dialysis t/t/s  . Stroke ([place])    tia x 2     Past Surgical History:  Procedure Laterality Date  . AMPUTATION TOE Bilateral 07/23/2018   Procedure: TOE MPJ RIGHT 3RD AND LEFT 2ND;  Surgeon: Samara Deist, DPM;  Location: [facility];  Service: Podiatry;  Laterality: Bilateral;  . APPENDECTOMY    . AV FISTULA PLACEMENT Left 06/09/2018   Procedure: ARTERIOVENOUS (AV) FISTULA CREATION ( BRACHIAL  CEPHALIC);  Surgeon: Algernon Huxley, MD;  Location: [facility];  Service: Vascular;  Laterality: Left;  . CHOLECYSTECTOMY    . DIALYSIS/PERMA CATHETER INSERTION N/A 03/24/2018   Procedure: DIALYSIS/PERMA CATHETER INSERTION;  Surgeon: Katha Cabal, MD;  Location: [place] [facility];  Service: Cardiovascular;  Laterality: N/A;  .  DIALYSIS/PERMA CATHETER REMOVAL N/A 09/06/2018   Procedure: DIALYSIS/PERMA CATHETER REMOVAL;  Surgeon: Algernon Huxley, MD;  Location: [place] [facility];  Service: Cardiovascular;  Laterality: N/A;  . INSERTION PROSTATE RADIATION SEED    . IR PERC PLEURAL DRAIN W/INDWELL CATH W/IMG GUIDE  11/03/2019  . IRRIGATION AND DEBRIDEMENT FOOT Right 12/17/2018   Procedure: IRRIGATION AND DEBRIDEMENT FOOT;  Surgeon: Samara Deist, DPM;  Location: [facility];  Service: Podiatry;  Laterality: Right;  . LOWER EXTREMITY ANGIOGRAPHY Right 06/16/2018   Procedure: LOWER EXTREMITY ANGIOGRAPHY;  Surgeon: Algernon Huxley, MD;  Location: [place] [facility];  Service: Cardiovascular;  Laterality: Right;  . LOWER EXTREMITY ANGIOGRAPHY Left 08/30/2018   Procedure: LOWER EXTREMITY ANGIOGRAPHY;  Surgeon: Algernon Huxley, MD;  Location: [facility];  Service: Cardiovascular;  Laterality: Left;  . LOWER EXTREMITY ANGIOGRAPHY Right 09/30/2018   Procedure: LOWER EXTREMITY ANGIOGRAPHY;  Surgeon: Algernon Huxley, MD;  Location: [facility];  Service: Cardiovascular;  Laterality: Right;  . LOWER EXTREMITY ANGIOGRAPHY Left 10/07/2018   Procedure: LOWER EXTREMITY ANGIOGRAPHY;  Surgeon: Algernon Huxley, MD;  Location: [place] [facility];  Service: Cardiovascular;  Laterality: Left;  . LOWER EXTREMITY ANGIOGRAPHY Right 12/20/2018   Procedure: Lower Extremity Angiography;  Surgeon: Algernon Huxley, MD;  Location: [place] [facility];  Service: Cardiovascular;  Laterality: Right;  . TRANSMETATARSAL AMPUTATION Bilateral 10/13/2018   Procedure: 1.  Amputation right great toe MTPJ 2.  Amputation right second toe MTPJ 3.  Amputation right fourth toe MTPJ 4.  Amputation right fifth toe MTPJ 5.  Excision distal second metatarsal head right foot 6.  Excision distal third metatarsal head right foot 7.  Amputation left third toe 8.  Incision and drainage infectionleft 2nd toe amputation site. ;  Surgeon: Samara Deist, DPM;  Location    Social  History   Socioeconomic History  . Marital status: Widowed    Spouse name: Not on file  . Number of children: Not on file  . Years of education: Not on file  . Highest education level: Not on file  Occupational History    Employer: RETIRED  Tobacco Use  . Smoking status: Current Some Day Smoker    Packs/day: 0.10    Types: Cigarettes  . Smokeless tobacco: Never Used  Substance and Sexual Activity  . Alcohol use: Not Currently    Alcohol/week: 0.0 standard drinks  . Drug use: Never  . Sexual activity: Not Currently  Other Topics Concern  . Not on file  Social History Narrative  . Not on file   Social Determinants of Health   Financial Resource Strain:   . Difficulty of Paying Living Expenses:   Food Insecurity:   . Worried About Charity fundraiser in the Last Year:   . Arboriculturist in the Last Year:   Transportation Needs:   . Film/video editor (Medical):   Marland Kitchen Lack of Transportation (Non-Medical):   Physical Activity:   . Days of Exercise per Week:   . Minutes of Exercise per Session:   Stress:   . Feeling of Stress :   Social Connections:   .  Frequency of Communication with Friends and Family:   . Frequency of Social Gatherings with Friends and Family:   . Attends Religious Services:   . Active Member of Clubs or Organizations:   . Attends Archivist Meetings:   Marland Kitchen Marital Status:   Intimate Partner Violence:   . Fear of Current or Ex-Partner:   . Emotionally Abused:   Marland Kitchen Physically Abused:   . Sexually Abused:      Family History  Problem Relation Age of Onset  . Varicose Veins Neg Hx      Current Facility-Administered Medications:  .  acetaminophen (TYLENOL) tablet 650 mg, 650 mg, Oral, Q6H PRN, Sharion Settler, NP .  escitalopram (LEXAPRO) tablet 10 mg, 10 mg, Oral, Daily, Raiford Noble Latif, DO, 10 mg at 11/05/19 5643 .  heparin injection 5,000 Units, 5,000 Units, Subcutaneous, Q8H, Raiford Noble [place], [place], 5,000 Units at 11/05/19  0602 .  insulin aspart (novoLOG) injection 0-5 Units, 0-5 Units, Subcutaneous, QHS, Garba, Mohammad L, MD .  insulin aspart (novoLOG) injection 0-6 Units, 0-6 Units, Subcutaneous, TID WC, Garba, Mohammad L, MD .  nicotine (NICODERM CQ - dosed in mg/24 hours) patch 21 mg, 21 mg, Transdermal, Daily, Allie Bossier, MD, 21 mg at 11/05/19 0831 .  ondansetron (ZOFRAN) tablet 4 mg, 4 mg, Oral, Q6H PRN **OR** ondansetron (ZOFRAN) injection 4 mg, 4 mg, Intravenous, Q6H PRN, Jonelle Sidle, Mohammad L, MD .  oxyCODONE (Oxy IR/ROXICODONE) immediate release tablet 5 mg, 5 mg, Oral, Q6H PRN, Sharion Settler, NP, 5 mg at 11/04/19 2114 .  tamsulosin (FLOMAX) capsule 0.4 mg, 0.4 mg, Oral, Daily, Raiford Noble Alder, DO, 0.4 mg at 11/05/19 3295 .  traMADol (ULTRAM) tablet 50 mg, 50 mg, Oral, Q6H PRN, Sharion Settler, NP   Physical exam:  Vitals:   11/05/19 1130 11/05/19 1145 11/05/19 1151 11/05/19 1200  BP: 101/67 (!) 76/54 105/68 (!) 93/59  Pulse: 91 94 90 93  Resp: 14 18 16 12   Temp:      TempSrc:      SpO2:      Weight:      Height:       Physical Exam  Constitutional: He is oriented to person, place, and time. No distress.  HENT:  Head: Normocephalic and atraumatic.  Eyes: Pupils are equal, round, and reactive to light. EOM are normal. No scleral icterus.  Pulmonary/Chest: Effort normal. No respiratory distress. He has no wheezes.  Decreased breath sound bilaterally.  Pleural catheter  Abdominal: Soft. Bowel sounds are normal.  Musculoskeletal:        General: Normal range of motion.     Cervical back: Neck supple.  Neurological: He is alert and oriented to person, place, and time.  Skin: Skin is warm and dry.  Psychiatric: Affect normal.       CMP Latest Ref Rng & Units 11/05/2019  Glucose 70 - 99 mg/dL 99  BUN 8 - 23 mg/dL 47(H)  Creatinine 0.61 - 1.24 mg/dL 5.81(H)  Sodium 135 - 145 mmol/L 136  Potassium 3.5 - 5.1 mmol/L 5.4(H)  Chloride 98 - 111 mmol/L 96(L)  CO2 22 - 32 mmol/L 29    Calcium 8.9 - 10.3 mg/dL 8.8(L)  Total Protein 6.5 - 8.1 g/dL 6.3(L)  Total Bilirubin 0.3 - 1.2 mg/dL 0.5  Alkaline Phos 38 - 126 U/L 42  AST 15 - 41 U/L 12(L)  ALT 0 - 44 U/L 8   CBC Latest Ref Rng & Units 11/05/2019  WBC 4.0 - 10.5  K/uL 9.5  Hemoglobin 13.0 - 17.0 g/dL 11.8(L)  Hematocrit 39.0 - 52.0 % 36.6(L)  Platelets 150 - 400 K/uL 222    RADIOGRAPHIC STUDIES: I have personally reviewed the radiological images as listed and agreed with the findings in the report. CT ABDOMEN PELVIS WO CONTRAST  Result Date: 10/30/2019 CLINICAL DATA:  74 year old male with history of prostate cancer. Left upper lobe mass seen on earlier CT. EXAM: CT ABDOMEN AND PELVIS WITHOUT CONTRAST TECHNIQUE: Multidetector CT imaging of the abdomen and pelvis was performed following the standard protocol without IV contrast. COMPARISON:  Chest CT dated 10/30/2019. FINDINGS: Evaluation of this exam is limited in the absence of intravenous contrast. Lower chest: Please see report for chest CT of 10/30/2019. No intra-abdominal free air or free fluid. Hepatobiliary: The liver is grossly unremarkable. No intrahepatic biliary ductal dilatation. There is irregular and masslike thickening of the gallbladder wall concerning for primary neoplasm or metastatic disease. Further initial evaluation with ultrasound recommended. MRI may provide better evaluation depending on the ultrasound findings. No calcified gallstone or pericholecystic fluid. Pancreas: Unremarkable. No pancreatic ductal dilatation or surrounding inflammatory changes. Spleen: Normal in size without focal abnormality. Adrenals/Urinary Tract: Indeterminate bilateral adrenal thickening or hyperplasia. Moderate bilateral renal parenchyma atrophy. There is a 15 mm left renal hypodense lesion which demonstrates fluid attenuation, likely a cyst. Additional smaller hypodense lesions are too small to characterize. There is no hydronephrosis on either side. The visualized ureters  appear unremarkable. Excreted contrast from recent CT is noted within the bladder. Stomach/Bowel: There is extensive sigmoid diverticulosis and scattered colonic diverticula without active inflammatory changes. There is no bowel obstruction or active inflammation. The appendix is normal. Vascular/Lymphatic: Advanced aortoiliac atherosclerotic disease. Diffuse ectasia of the abdominal aorta with borderline aneurysmal dilatation of the infrarenal abdominal aorta measuring up to 3 cm in diameter. The IVC is unremarkable. Bilateral common iliac artery stents noted. No portal venous gas. There is no adenopathy. Reproductive: Prostate brachytherapy seeds. Other: Paucity of subcutaneous fat. Musculoskeletal: Degenerative changes of the spine with findings of renal osteodystrophy. No acute osseous pathology. Mild bilateral femoral head avascular necrosis. IMPRESSION: 1. Irregular and masslike thickening of the gallbladder wall concerning for primary neoplasm or metastatic disease. Further initial evaluation with ultrasound recommended. 2. Extensive colonic diverticulosis. No bowel obstruction. Normal appendix. 3. Aortic Atherosclerosis (ICD10-I70.0). 4. A 3 cm infrarenal abdominal aortic aneurysm. Recommend followup by ultrasound in 3 years. This recommendation follows ACR consensus guidelines: White Paper of the ACR Incidental Findings Committee II on Vascular Findings. J Am Coll Radiol 2013; 10:789-794. Aortic aneurysm NOS (ICD10-I71.9) Electronically Signed   By: Anner Crete M.D.   On: 10/30/2019 19:40   DG Chest 1 View  Result Date: 11/04/2019 CLINICAL DATA:  Shortness of breath EXAM: CHEST  1 VIEW COMPARISON:  11/01/2019 FINDINGS: Interval placement of left-sided pleural drainage catheter with significant reduction in the size of a left-sided pleural effusion. Streaky opacity within the left lung base, which may reflect atelectasis. Known left upper lobe nodule, better seen on recent CT. The right lung appears  clear. Heart size remains mildly enlarged. No pneumothorax. IMPRESSION: Interval placement of left-sided pleural drainage catheter with significant reduction in the size of a left-sided pleural effusion. No pneumothorax. Electronically Signed   By: Davina Poke D.O.   On: 11/04/2019 08:04   CT CHEST W CONTRAST  Result Date: 10/30/2019 CLINICAL DATA:  Left pleural effusion. Thoracentesis yesterday demonstrated bloody fluid. Malignancy suspected. EXAM: CT CHEST WITH CONTRAST TECHNIQUE: Multidetector CT imaging of  the chest was performed during intravenous contrast administration. CONTRAST:  75mL OMNIPAQUE IOHEXOL 300 MG/ML  SOLN COMPARISON:  Recent chest x-rays FINDINGS: Cardiovascular: Atherosclerotic changes are seen in the thoracic aorta. The ascending thoracic aorta measures up to 4.1 cm, mildly aneurysmal. The central pulmonary arteries are normal. Coronary artery calcifications involving the right and left coronary arteries. The heart is unremarkable. Mediastinum/Nodes: There is a mildly prominent node in the base of the neck on the left measuring 9 mm in short axis on series 2, image 14, asymmetric to the right. There is adenopathy with a node measuring 13 mm in short axis in the prevascular space. There is a prominent node in the AP window measuring 15 mm on series 2, image 56. No subcarinal adenopathy. No right-sided adenopathy identified. There is a moderate to large left effusion and a tiny right effusion. No pericardial effusion identified. The thyroid is unremarkable. The esophagus is normal. Lungs/Pleura: There is a spiculated mass in the left upper lobe with central cavitation. Platelike opacity extends off the inferior aspect of the mass, likely associated atelectasis. The mass extends to the pleural surface with some adjacent pleural fluid and pleural thickening. The mass appears to measure 3.2 by 1.7 by 1.6 cm in craniocaudal, transverse, and AP dimensions. There is a moderate left-sided  pleural effusion with underlying opacity, likely atelectasis. No centrally obstructing mass is identified on the left. There is a small right effusion with associated atelectasis. Emphysematous changes are seen in the lungs. Upper Abdomen: Visualized liver and gallbladder are normal. The right kidney is relatively atrophic. The left kidney is not visualized. The left adrenal gland is mildly prominent as seen on series 2, image 162 but not fully evaluated. No other abnormalities in the upper abdomen. Musculoskeletal: No chest wall abnormality. No acute or significant osseous findings. IMPRESSION: 1. Spiculated cavitated mass in the left upper lobe is above highly suspicious for malignancy. Linear opacity extending off the inferior mass is likely atelectasis. The mass extends to the left pleural surface with mild associated pleural thickening and fluid. Given history, the large left pleural effusion is likely malignant. 2. Adenopathy in the mediastinum worrisome for metastatic disease. A PET-CT could better evaluate. 3. 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 4. Mildly prominent left adrenal gland, incompletely evaluated. Recommend attention on PET-CT. 5. Coronary artery calcifications. 6. Atelectasis in the bases. 7. Emphysematous changes in the lungs. Aortic Atherosclerosis (ICD10-I70.0) and Emphysema (ICD10-J43.9). Electronically Signed   By: Dorise Bullion III M.D   On: 10/30/2019 10:57   US Pelvis Limited  Result Date: 10/22/2019 CLINICAL DATA:  74 year old male with a history of urinary frequency EXAM: LIMITED ULTRASOUND OF PELVIS TECHNIQUE: Limited transabdominal ultrasound examination of the pelvis was performed. COMPARISON:  None. FINDINGS: Urinary bladder measures 440 cc prevoid, 403 cc post void. Prostate volume estimated 15 cc. IMPRESSION: Significant post void residual indicating urinary retention. Prostate volume estimated 15 cc. Electronically Signed   By: Corrie Mckusick D.O.   On: 10/22/2019 12:24   DG Chest Port 1 View  Result Date: 11/01/2019 CLINICAL DATA:  Pleural effusion EXAM: PORTABLE CHEST 1 VIEW COMPARISON:  Oct 30, 2019 FINDINGS: The heart size and mediastinal contours ar unchanged with cardiomegaly which is partially obscured. Again noted is a peripherally based rounded airspace opacity in the left upper lung. A large left effusion/consolidation is seen. The right lung is clear. No acute osseous abnormality. IMPRESSION: Rounded airspace opacities/nodule seen within the left upper lung as  on recent CT. Large left pleural effusion/consolidation Electronically Signed   By: Prudencio Pair M.D.   On: 11/01/2019 05:42   DG Chest Port 1 View  Result Date: 10/29/2019 CLINICAL DATA:  74 year old male status post thoracentesis. EXAM: PORTABLE CHEST 1 VIEW COMPARISON:  Earlier radiograph dated 10/29/2019. FINDINGS: Interval decrease in the size of the left pleural effusion with moderate residual effusion. There is aerated portion of the left upper lobe. The right lung is clear. No pneumothorax. Stable cardiomediastinal silhouette. No acute osseous pathology. IMPRESSION: Interval decrease in the size of the left pleural effusion with moderate residual effusion. No pneumothorax. Electronically Signed   By: Anner Crete M.D.   On: 10/29/2019 23:34   DG Chest Port 1 View  Result Date: 10/29/2019 CLINICAL DATA:  Dyspnea. Abdominal pain and vomiting while at dialysis today, unable to get complete treatment. EXAM: PORTABLE CHEST 1 VIEW COMPARISON:  Radiograph 10/22/2019 FINDINGS: Progressive left pleural effusion with near complete opacification of the left hemithorax, small portion of left apical lung is aerated. There is rightward mediastinal shift suggesting large fluid volume rather than compressive atelectasis. Mediastinal contours are obscured by left lung opacity. Probable right perihilar atelectasis. No significant right pleural effusion. No pneumothorax.  IMPRESSION: Progressive left pleural effusion over the past week with near complete opacification of the left hemithorax. Rightward mediastinal shift suggesting large fluid volume volume rather than passive atelectasis. Electronically Signed   By: Keith Rake M.D.   On: 10/29/2019 20:45   DG Chest Portable 1 View  Result Date: 10/22/2019 CLINICAL DATA:  Productive cough for 4 days with congestion and watery eyes. Current smoker. History of diabetes. EXAM: PORTABLE CHEST 1 VIEW COMPARISON:  Chest radiographs 06/12/2018 and 03/21/2018. FINDINGS: 1141 hours. Dialysis catheter has been removed in the interval. The visualized heart size and mediastinal contours are stable with mild aortic atherosclerosis. There is a new moderate-sized left pleural effusion with associated left basilar pulmonary opacity. No significant right pleural effusion. No pneumothorax. The pulmonary vasculature is slightly indistinct, and there may be mild pulmonary edema. No significant osseous findings. IMPRESSION: New moderate-sized left pleural effusion with associated left basilar atelectasis or infiltrate. Possible mild pulmonary edema. Electronically Signed   By: Richardean Sale M.D.   On: 10/22/2019 12:18   US Abdomen Limited RUQ  Result Date: 11/04/2019 CLINICAL DATA:  Thickened gallbladder wall on CT. EXAM: ULTRASOUND ABDOMEN LIMITED RIGHT UPPER QUADRANT COMPARISON:  CT 10/30/2019. FINDINGS: Gallbladder: Multiple shadowing echo densities in the gallbladder fossa consistent with gallstones in a contracted gallbladder. Gallbladder wall incompletely evaluated due to shadowing from gallstones. Visualized portion of the gallbladder wall measures 2 mm. Negative Murphy sign. Common bile duct: Diameter: 3 mm Liver: No focal lesion identified. Within normal limits in parenchymal echogenicity. Portal vein is patent on color Doppler imaging with normal direction of blood flow towards the liver. Other: None. IMPRESSION: 1. Findings  consistent with multiple gallstones in a contracted gallbladder. Gallbladder wall incompletely evaluated due to shadowing from gallstones. 2.  No evidence of biliary distention.  Liver appears normal. Electronically Signed   By: Marcello Moores  Register   On: 11/04/2019 11:44   IR PERC PLEURAL DRAIN W/INDWELL CATH W/IMG GUIDE  Result Date: 11/03/2019 CLINICAL DATA:  Malignant left pleural effusion. Rapid reaccumulation after large volume left thoracentesis on 10/29/2019. Tunneled pleural drainage catheter placement has been requested. EXAM: INSERTION OF TUNNELED PLEURAL DRAINAGE CATHETER ANESTHESIA/SEDATION: 0.5 mg IV Versed; 25 mcg IV Fentanyl. Total Moderate Sedation Time 12 minutes. The patient's level of  consciousness and physiologic status were continuously monitored during the procedure by Radiology nursing. MEDICATIONS: 600 mg IV clindamycin. Antibiotic was administered in an appropriate time interval for the procedure. FLUOROSCOPY TIME:  No fluoroscopy was utilized. PROCEDURE: The procedure, risks, benefits, and alternatives were explained to the patient. Questions regarding the procedure were encouraged and answered. The patient understands and consents to the procedure. A time-out was performed prior to initiating the procedure. The left lateral chest wall was prepped with chlorhexidine in a sterile fashion, and a sterile drape was applied covering the operative field. A sterile gown and sterile gloves were used for the procedure. Local anesthesia was provided with 1% Lidocaine. Ultrasound image documentation was performed. After creating a small skin incision, a 19 gauge needle was advanced into the pleural cavity under ultrasound guidance. A guide wire was then advanced into the pleural space. Pleural access was dilated serially and a 16-French peel-away sheath placed. A 15.5 French tunneled PleurX catheter was placed. This was tunneled from an incision 5 cm below the pleural access to the access site. The  catheter was advanced through the peel-away sheath. The sheath was then removed. The access incision was closed with subcuticular 4-0 Monocryl. Dermabond was applied to the incision. A Prolene retention suture was applied at the catheter exit site. Large volume thoracentesis was performed through the new catheter utilizing gravity drainage bag. COMPLICATIONS: None. FINDINGS: The catheter was placed via the left lateral chest wall. Approximately 2.4 liters of bloody pleural fluid was able to be removed after catheter placement. IMPRESSION: Placement of permanent, tunneled left pleural drainage catheter via lateral approach. 2.4 liters of pleural fluid was removed today after catheter placement. Electronically Signed   By: Aletta Edouard M.D.   On: 11/03/2019 15:35   US THORACENTESIS ASP PLEURAL SPACE W/IMG GUIDE  Result Date: 10/29/2019 INDICATION: 74 year old with shortness of breath and large left pleural effusion. EXAM: ULTRASOUND GUIDED LEFT THORACENTESIS MEDICATIONS: None. COMPLICATIONS: None immediate. PROCEDURE: An ultrasound guided thoracentesis was thoroughly discussed with the patient and questions answered. The benefits, risks, alternatives and complications were also discussed. The patient understands and wishes to proceed with the procedure. Written consent was obtained. Ultrasound was performed to localize and mark an adequate pocket of fluid in the left chest. The area was then prepped and draped in the normal sterile fashion. 1% Lidocaine was used for local anesthesia. Under ultrasound guidance a 6 Fr Safe-T-Centesis catheter was introduced. Thoracentesis was performed. The catheter was removed and a dressing applied. FINDINGS: A total of approximately 2.35 L of bloody fluid was removed. Samples were sent to the laboratory as requested by the clinical team. IMPRESSION: Successful ultrasound guided left thoracentesis yielding 2.35 L of pleural fluid. Electronically Signed   By: Markus Daft M.D.    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  Diagnosis of lung cancer was discussed with him. Currently his breathing status has improved and remained stable. PT/OT evaluation.  I will arrange patient to have outpatient MRI brain to complete his staging. Goal of care was discussed with patient.  Treatment is with palliative intent. Patient desires treatment and I will further discuss with him outpatient. Will send molecular testing.  #Irregularity of the gallbladder wall on CT which was not confirmed on ultrasound gallbladder.  Probably due to multiple gallstones.  CEA was normal.  CA 19-9 is still pending. In the future if primary gallbladder  neoplasm is suspected, will arrange PET scan for further evaluation.  I will hold off for now.  Thank you for allowing me to participate in the care of this patient.   Earlie Server, MD, PhD Hematology Oncology [facility] at [facility] Pager- [phone] 11/05/2019

## 2019-11-05 NOTE — TOC Transition Note (Signed)
Transition of Care [facility]) - CM/SW Discharge Note   Patient Details  Name: Jose [name] MRN: [id number] Date of Birth: Mar 15, 1946  Transition of Care [facility]) CM/SW Contact:  Boris Sharper, LCSW Phone Number: 11/05/2019, 2:43 PM   Clinical Narrative:    Pt medially stable for discharge per MD. Pt will be follow for OT and PT by [facility]. CSW notified [place] with [facility] of discharge.     Barriers to Discharge: Continued Medical Work up   Patient Goals and CMS Choice Patient states their goals for this hospitalization and ongoing recovery are:: go home      Discharge Placement                       Discharge Plan and Services   Discharge Planning Services: CM Consult            DME Arranged: 3-N-1 DME Agency: [employer] Date DME Agency Contacted: 11/01/19 Time DME Agency Contacted: [phone] Representative spoke with at DME Agency: [place]: RN, Nurse's Aide          Social Determinants of Health ([place]) Interventions     Readmission Risk Interventions Readmission Risk Prevention Plan 12/20/2018 10/15/2018 10/14/2018  Transportation Screening Complete Complete Complete  PCP or Specialist Appt within 3-5 Days - (No Data) -  [place] or Home Care Consult Complete - Complete  Social Work Consult for [place] Planning/Counseling - - Not Complete  Palliative Care Screening - - Not Applicable  Medication Review Press photographer) Complete - Complete  Some recent data might be hidden

## 2019-11-05 NOTE — Evaluation (Signed)
Physical Therapy Evaluation Patient Details Name: Jose [name] MRN: [id number] DOB: 08-16-1945 Today's Date: 11/05/2019   History of Present Illness  Pt admitted for L pleural effusion and is s/p drainage cath on 6/3. Pt with initial complaints of abdominal pain and vomiting. Plans for HD today. History includes ESRD on HF, DM, HTN, prostate cancer, CVA, depression, and glaucoma. Pt with recent admissions for similar symptoms.  Clinical Impression  Pt is a pleasant 74 year old male who was admitted for L pleural effusion with drainage catheter. Pt performs bed mobility with cga, transfers with min assist, and ambulation with cga and RW. Pt demonstrates deficits with strength/balance/mobility. Would benefit from skilled PT to address above deficits and promote optimal return to PLOF. Recommend transition to [place] upon discharge from acute hospitalization.     Follow Up Recommendations Home health PT    Equipment Recommendations  3in1 (PT)    Recommendations for Other Services       Precautions / Restrictions Precautions Precautions: Fall Restrictions Weight Bearing Restrictions: No      Mobility  Bed Mobility Overal bed mobility: Needs Assistance Bed Mobility: Supine to Sit     Supine to sit: Min guard     General bed mobility comments: safe technique with upright posture once seated at EOB. Needs cues for sequencing.  Transfers Overall transfer level: Needs assistance Equipment used: Rolling walker (2 wheeled) Transfers: Sit to/from Stand Sit to Stand: Min assist         General transfer comment: 1st attempt with mod post leaning. 2nd attempt with cues for sequencing. Once standing, able to use RW  Ambulation/Gait Ambulation/Gait assistance: Min guard Gait Distance (Feet): 40 Feet Assistive device: Rolling walker (2 wheeled) Gait Pattern/deviations: Step-to pattern     General Gait Details: ambulated with RW in room. Slight unsteadiness during ambulation,  however no formal LOB noted. Step to gait pattern noted with slow speed.  Stairs            Wheelchair Mobility    Modified Rankin (Stroke Patients Only)       Balance Overall balance assessment: Needs assistance Sitting-balance support: Feet supported Sitting balance-Leahy Scale: Good     Standing balance support: Bilateral upper extremity supported Standing balance-Leahy Scale: Fair                               Pertinent Vitals/Pain Pain Assessment: No/denies pain    Home Living Family/patient expects to be discharged to:: Private residence Living Arrangements: Other relatives(lives with sister, has family who also check in daily) Available Help at Discharge: Family;Friend(s);Available PRN/intermittently Type of Home: House Home Access: Stairs to enter Entrance Stairs-Rails: Can reach both Entrance Stairs-Number of Steps: 3 Home Layout: One level Home Equipment: Walker - 2 wheels;Cane - single point;Wheelchair - manual      Prior Function Level of Independence: Needs assistance   Gait / Transfers Assistance Needed: has RW and WC for mobility in the house  ADL's / Homemaking Assistance Needed: nephew helps with bathing, dressing, toiletting; able to self feed.   Comments: typically ambulates household distances. Uses RW for all mobility     Hand Dominance        Extremity/Trunk Assessment   Upper Extremity Assessment Upper Extremity Assessment: Generalized weakness(B UE grossly 4/5)    Lower Extremity Assessment Lower Extremity Assessment: Generalized weakness(B LE grossly 4/5)       Communication   Communication: No difficulties  Cognition Arousal/Alertness: Awake/alert Behavior During Therapy: WFL for tasks assessed/performed Overall Cognitive Status: Within Functional Limits for tasks assessed                                        General Comments      Exercises Other Exercises Other Exercises: Pt with  incontient episode while in bed. Performed rolling to B sides for hygiene. Needs max assist for hygiene.   Assessment/Plan    PT Assessment Patient needs continued PT services  PT Problem List Decreased strength;Decreased activity tolerance;Decreased balance;Decreased mobility       PT Treatment Interventions Gait training;Therapeutic exercise;Balance training;DME instruction    PT Goals (Current goals can be found in the Care Plan section)  Acute Rehab PT Goals Patient Stated Goal: to go home PT Goal Formulation: With patient Time For Goal Achievement: 11/19/19 Potential to Achieve Goals: Good    Frequency Min 2X/week   Barriers to discharge        Co-evaluation               AM-PAC PT "6 Clicks" Mobility  Outcome Measure Help needed turning from your back to your side while in a flat bed without using bedrails?: A Little Help needed moving from lying on your back to sitting on the side of a flat bed without using bedrails?: A Little Help needed moving to and from a bed to a chair (including a wheelchair)?: A Little Help needed standing up from a chair using your arms (e.g., wheelchair or bedside chair)?: A Little Help needed to walk in hospital room?: A Little Help needed climbing 3-5 steps with a railing? : A Lot 6 Click Score: 17    End of Session Equipment Utilized During Treatment: Gait belt Activity Tolerance: Patient tolerated treatment well Patient left: in bed(seated at EOB with RN) Nurse Communication: Mobility status PT Visit Diagnosis: Muscle weakness (generalized) (M62.81);Difficulty in walking, not elsewhere classified (R26.2)    Time: 0964-3838 PT Time Calculation (min) (ACUTE ONLY): 28 min   Charges:   PT Evaluation $PT Eval Moderate Complexity: 1 Mod PT Treatments $Therapeutic Activity: 8-22 mins        Greggory Stallion, PT, DPT [phone]   Jose [name] 11/05/2019, 1:11 PM

## 2019-11-05 NOTE — Plan of Care (Signed)

## 2019-11-05 NOTE — Progress Notes (Signed)
This note also relates to the following rows which could not be included: Pulse Rate - Cannot attach notes to unvalidated device data Resp - Cannot attach notes to unvalidated device data BP - Cannot attach notes to unvalidated device data  Hd completed  

## 2019-11-05 NOTE — Progress Notes (Signed)
Pt transferred via bed by orderly to HD.

## 2019-11-07 ENCOUNTER — Telehealth: Payer: Self-pay

## 2019-11-07 ENCOUNTER — Encounter: Payer: Self-pay | Admitting: *Deleted

## 2019-11-07 ENCOUNTER — Telehealth: Payer: Self-pay | Admitting: *Deleted

## 2019-11-07 DIAGNOSIS — C3490 Malignant neoplasm of unspecified part of unspecified bronchus or lung: Secondary | ICD-10-CM

## 2019-11-07 NOTE — Telephone Encounter (Signed)
Omniseq requisition faxed to [facility] pathology for specimen # [phone].

## 2019-11-07 NOTE — Telephone Encounter (Signed)
Per [facility] 11/07/19 staff message pt needs a STAT Aaron Edelman MRI  MRI was scheduled as requested.  I  tried calling pt x3 and was unable to reach him. Called both pt contacts, I was able to get in touch with pts son [name]) I made him aware of his father's scheduled 11/08/19 MRI appt @ the [facility] location and time of the appt. He stated that he would make his father aware.

## 2019-11-08 ENCOUNTER — Ambulatory Visit: Admission: RE | Admit: 2019-11-08 | Payer: Medicare Other | Source: Ambulatory Visit

## 2019-11-08 NOTE — Progress Notes (Signed)
  Oncology Nurse Navigator Documentation  Navigator Location: [facility] (11/08/19 0800)   )Navigator Encounter Type: Appt/Treatment Plan Review (11/08/19 0800)   Abnormal Finding Date: 10/29/19 (11/08/19 0800) Confirmed Diagnosis Date: 11/04/19 (11/08/19 0800)                   Barriers/Navigation Needs: Coordination of Care (11/08/19 0800)   Interventions: Coordination of Care (11/08/19 0800)   Coordination of Care: Appts;Radiology (11/08/19 0800)                  Time Spent with Patient: 30 (11/08/19 0800)

## 2019-11-09 ENCOUNTER — Inpatient Hospital Stay: Payer: Medicare Other | Admitting: Oncology

## 2019-11-09 LAB — CHOLESTEROL, BODY FLUID: Cholesterol, Fluid: 104 mg/dL

## 2019-11-10 ENCOUNTER — Other Ambulatory Visit: Payer: Medicare Other

## 2019-11-10 ENCOUNTER — Telehealth: Payer: Self-pay | Admitting: *Deleted

## 2019-11-10 NOTE — Progress Notes (Signed)
Tumor Board Documentation  Jose [name] was presented by Dr Tasia Catchings at our Tumor Board on 11/10/2019, which included representatives from medical oncology, radiation oncology, internal medicine, navigation, pathology, radiology, surgical, pharmacy, genetics, research.  Jose [name] currently presents as a new patient, for [place], for new positive pathology with history of the following treatments: active survellience, surgical intervention(s), neoadjuvant radiation.  Additionally, we reviewed previous medical and familial history, history of present illness, and recent lab results along with all available histopathologic and imaging studies. The tumor board considered available treatment options and made the following recommendations: Additional screening (MRI, Molecular testing on pathology)    The following procedures/referrals were also placed: No orders of the defined types were placed in this encounter.   Clinical Trial Status: not discussed   Staging used: AJCC Stage Group  AJCC Staging: T: 2 N: 2 M: 1a Group: Adenocarcinoma of lung Stage 4a   National site-specific guidelines NCCN were discussed with respect to the case.  Tumor board is a meeting of clinicians from various specialty areas who evaluate and discuss patients for whom a multidisciplinary approach is being considered. Final determinations in the plan of care are those of the provider(s). The responsibility for follow up of recommendations given during tumor board is that of the provider.   Today's extended care, comprehensive team conference, Jose [name] was not present for the discussion and was not examined.   Multidisciplinary Tumor Board is a multidisciplinary case peer review process.  Decisions discussed in the Multidisciplinary Tumor Board reflect the opinions of the specialists present at the conference without having examined the patient.  Ultimately, treatment and diagnostic decisions rest with the primary provider(s) and the  patient.

## 2019-11-10 NOTE — Telephone Encounter (Signed)
Called pt to review upcoming appts. Pt not available but his nephew answered the phone. Nephew stated that he is not near pt at this time and would let him know to give me a call back to review his upcoming appts. Encouraged for pt to call back today or tomorrow since appts are scheduled for early next week. Nephew verbalized understanding.

## 2019-11-11 ENCOUNTER — Other Ambulatory Visit: Payer: Self-pay

## 2019-11-11 ENCOUNTER — Emergency Department: Payer: Medicare Other

## 2019-11-11 ENCOUNTER — Encounter: Payer: Self-pay | Admitting: Emergency Medicine

## 2019-11-11 ENCOUNTER — Telehealth: Payer: Self-pay | Admitting: *Deleted

## 2019-11-11 ENCOUNTER — Emergency Department
Admission: EM | Admit: 2019-11-11 | Discharge: 2019-11-11 | Disposition: A | Discharge status: Home / Self Care | Payer: Medicare Other | Attending: Emergency Medicine | Admitting: Emergency Medicine

## 2019-11-11 DIAGNOSIS — N186 End stage renal disease: Secondary | ICD-10-CM | POA: Diagnosis not present

## 2019-11-11 DIAGNOSIS — I5032 Chronic diastolic (congestive) heart failure: Secondary | ICD-10-CM | POA: Diagnosis not present

## 2019-11-11 DIAGNOSIS — Z09 Encounter for follow-up examination after completed treatment for conditions other than malignant neoplasm: Secondary | ICD-10-CM | POA: Diagnosis not present

## 2019-11-11 DIAGNOSIS — Z720 Tobacco use: Secondary | ICD-10-CM | POA: Insufficient documentation

## 2019-11-11 DIAGNOSIS — Z79899 Other long term (current) drug therapy: Secondary | ICD-10-CM | POA: Insufficient documentation

## 2019-11-11 DIAGNOSIS — I132 Hypertensive heart and chronic kidney disease with heart failure and with stage 5 chronic kidney disease, or end stage renal disease: Secondary | ICD-10-CM | POA: Diagnosis not present

## 2019-11-11 DIAGNOSIS — Z7901 Long term (current) use of anticoagulants: Secondary | ICD-10-CM | POA: Diagnosis not present

## 2019-11-11 DIAGNOSIS — E1122 Type 2 diabetes mellitus with diabetic chronic kidney disease: Secondary | ICD-10-CM | POA: Diagnosis not present

## 2019-11-11 DIAGNOSIS — Z7982 Long term (current) use of aspirin: Secondary | ICD-10-CM | POA: Diagnosis not present

## 2019-11-11 DIAGNOSIS — Z992 Dependence on renal dialysis: Secondary | ICD-10-CM | POA: Diagnosis not present

## 2019-11-11 NOTE — ED Notes (Signed)
See triage note, pt reports he is here because the "tube in my belly is clogged". Drain noted to left chest/abdomen area.  Pt reports he had "a lot of fluid drained off my belly".  Reports home health nurse could not care for drain, lives with sister.

## 2019-11-11 NOTE — ED Notes (Signed)
Drain dressing changed with Alwyn Pea

## 2019-11-11 NOTE — ED Triage Notes (Signed)
Says he was sent home with tube in left abdomen.  [facility] came and they are unable to care for this tube.  They sent him back here so that they can find someone else to take care of the tube.  Patient has a tube  At left upper quad abd..  He does not feel sick.

## 2019-11-11 NOTE — Telephone Encounter (Signed)
Multiple attempts made to contact pt to review upcoming appts for brain MRI and follow up with Dr. Tasia Catchings. Spoke with pt's nephew yesterday who stated would have patient call me back. I have not received a call back at this time. Called all numbers listed on chart with no answer. Able to leave message on pt's primary number in chart with appt details for MRI and follow up appt. Instructed to call back if has any further questions or needs to change appt.

## 2019-11-11 NOTE — TOC Initial Note (Signed)
Transition of Care [facility]) - Initial/Assessment Note    Patient Details  Name: Jose [name] MRN: [id number] Date of Birth: 1945-07-02  [facility]) CM/SW Contact:    Anselm Pancoast, RN Phone Number: 11/11/2019, 4:07 PM  Clinical Narrative:                 Received call from [facility] with [place] states they are unable to accommodate the Pleurx drain and are not able to accept patient. [facility] and [facility] are unable to accept patient as well.   Patient accepted for care by Malachy Mood with [phone]  however they can't admit patient until Monday.   RN CM discussed case with Marya Amsler, charge nurse and confirmed Malachy Mood with [facility] is accepting patient however it may be Monday before patient is seen. Need to ensure MD is in aggreance with delay until Monday prior to discharge home.          Patient Goals and CMS Choice        Expected Discharge Plan and Services                                                Prior Living Arrangements/Services                       Activities of Daily Living      Permission Sought/Granted                  Emotional Assessment              Admission diagnosis:  clogged port Patient Active Problem List   Diagnosis Date Noted  . Thickening of wall of gallbladder   . Primary adenocarcinoma of left lung ([place])   . Hypokalemia 10/30/2019  . Chronic diastolic CHF (congestive heart failure) ([place]) 10/30/2019  . Benign essential HTN 10/30/2019  . Malignant neoplasm of unknown origin ([place]) 10/30/2019  . Acute respiratory failure with hypoxia ([place]) 10/30/2019  . End-stage renal disease on hemodialysis ([place]) 10/30/2019  . Diabetes mellitus type 2, controlled, with complications ([place]) 53/66/4403  . SOB (shortness of breath) 10/29/2019  . Pulmonary edema 10/22/2019  . Diarrhea 10/22/2019  . Acute on chronic diastolic CHF (congestive heart failure) ([place]) 10/22/2019  . HLD  (hyperlipidemia)   . Stroke ([place])   . Depression   . Tobacco abuse   . Pleural effusion on left   . Acute urinary retention   . Foot ulcer ([place]) 12/15/2018  . Ischemic foot 10/11/2018  . Atherosclerosis of native arteries of the extremities with gangrene ([place]) 07/09/2018  . Gangrene of toe of right foot ([place]) 06/13/2018  . Type II diabetes mellitus with renal manifestations ([place]) 04/16/2018  . Hypertension 04/16/2018  . ESRD on dialysis ([place]) 04/16/2018  . Malnutrition of moderate degree 03/27/2018  . Renal failure 03/21/2018   PCP:  [name], MD Pharmacy:   [id number] [place], [address] [place] [address] Phone: [phone] Fax: [phone]  [facility]ication Mgmt[facility] [place], [address] [place] [address] Phone: [phone] Fax: [phone]     Social Determinants of Health (SDOH) Interventions    Readmission Risk Interventions Readmission Risk Prevention Plan 12/20/2018 10/15/2018 10/14/2018  Transportation Screening Complete Complete Complete  PCP or Specialist Appt within 3-5 Days - (No Data) -  [place] or Home Care Consult Complete - Complete  Social Work Consult for [place] Planning/Counseling - - Not Complete  Palliative Care Screening - - Not Applicable  Medication Review (RN Care Manager) Complete - Complete  Some recent data might be hidden

## 2019-11-11 NOTE — Discharge Instructions (Addendum)
We will change the dressing on your wound today.  Leave it intact until Monday and a different home health company will come and take care of it.  Return here if you have pain in the area redness swelling fever or shortness of breath.

## 2019-11-11 NOTE — ED Provider Notes (Signed)
[facility] Emergency Department Provider Note   ____________________________________________   First MD Initiated Contact with Patient 11/11/19 1712     (approximate)  I have reviewed the triage vital signs and the nursing notes.   HISTORY  Chief Complaint other    HPI Jose [name] is a 74 y.o. male patient here because his home health provider does not know what to do with his Pleurx catheter.  Care management was able to find a different home health company that does know what to do.  We will change the dressing and send him back.  He will return for any further problems.      Past Medical History:  Diagnosis Date  . Chronic kidney disease   . Depression   . Diabetes mellitus without complication ([place])   . Glaucoma   . Glaucoma   . Gout   . Heart murmur   . HLD (hyperlipidemia)   . Hypertension   . Prostate cancer ([place])   . Renal failure    dialysis t/t/s  . Stroke ([place])    tia x 2    Patient Active Problem List   Diagnosis Date Noted  . Thickening of wall of gallbladder   . Primary adenocarcinoma of left lung ([place])   . Hypokalemia 10/30/2019  . Chronic diastolic CHF (congestive heart failure) ([place]) 10/30/2019  . Benign essential HTN 10/30/2019  . Malignant neoplasm of unknown origin ([place]) 10/30/2019  . Acute respiratory failure with hypoxia ([place]) 10/30/2019  . End-stage renal disease on hemodialysis ([place]) 10/30/2019  . Diabetes mellitus type 2, controlled, with complications ([place]) 44/81/8563  . SOB (shortness of breath) 10/29/2019  . Pulmonary edema 10/22/2019  . Diarrhea 10/22/2019  . Acute on chronic diastolic CHF (congestive heart failure) ([place]) 10/22/2019  . HLD (hyperlipidemia)   . Stroke ([place])   . Depression   . Tobacco abuse   . Pleural effusion on left   . Acute urinary retention   . Foot ulcer ([place]) 12/15/2018  . Ischemic foot 10/11/2018  . Atherosclerosis of native arteries of the extremities with gangrene  ([place]) 07/09/2018  . Gangrene of toe of right foot ([place]) 06/13/2018  . Type II diabetes mellitus with renal manifestations ([place]) 04/16/2018  . Hypertension 04/16/2018  . ESRD on dialysis ([place]) 04/16/2018  . Malnutrition of moderate degree 03/27/2018  . Renal failure 03/21/2018    Past Surgical History:  Procedure Laterality Date  . AMPUTATION TOE Bilateral 07/23/2018   Procedure: TOE MPJ RIGHT 3RD AND LEFT 2ND;  Surgeon: Samara Deist, DPM;  Location: [facility] ORS;  Service: Podiatry;  Laterality: Bilateral;  . APPENDECTOMY    . AV FISTULA PLACEMENT Left 06/09/2018   Procedure: ARTERIOVENOUS (AV) FISTULA CREATION ( BRACHIAL CEPHALIC);  Surgeon: Algernon Huxley, MD;  Location: [facility] ORS;  Service: Vascular;  Laterality: Left;  . CHOLECYSTECTOMY    . DIALYSIS/PERMA CATHETER INSERTION N/A 03/24/2018   Procedure: DIALYSIS/PERMA CATHETER INSERTION;  Surgeon: Katha Cabal, MD;  Location: [place] [facility];  Service: Cardiovascular;  Laterality: N/A;  . DIALYSIS/PERMA CATHETER REMOVAL N/A 09/06/2018   Procedure: DIALYSIS/PERMA CATHETER REMOVAL;  Surgeon: Algernon Huxley, MD;  Location: [place] [facility];  Service: Cardiovascular;  Laterality: N/A;  . INSERTION PROSTATE RADIATION SEED    . IR PERC PLEURAL DRAIN W/INDWELL CATH W/IMG GUIDE  11/03/2019  . IRRIGATION AND DEBRIDEMENT FOOT Right 12/17/2018   Procedure: IRRIGATION AND DEBRIDEMENT FOOT;  Surgeon: Samara Deist, DPM;  Location: [facility];  Service: Podiatry;  Laterality: Right;  . LOWER EXTREMITY ANGIOGRAPHY Right 06/16/2018   Procedure: LOWER EXTREMITY ANGIOGRAPHY;  Surgeon: Algernon Huxley, MD;  Location: [employer] [facility];  Service: Cardiovascular;  Laterality: Right;  . LOWER EXTREMITY ANGIOGRAPHY Left 08/30/2018   Procedure: LOWER EXTREMITY ANGIOGRAPHY;  Surgeon: Algernon Huxley, MD;  Location: [place] [facility];  Service: Cardiovascular;  Laterality: Left;  . LOWER EXTREMITY ANGIOGRAPHY Right 09/30/2018   Procedure: LOWER EXTREMITY  ANGIOGRAPHY;  Surgeon: Algernon Huxley, MD;  Location: [place] [facility];  Service: Cardiovascular;  Laterality: Right;  . LOWER EXTREMITY ANGIOGRAPHY Left 10/07/2018   Procedure: LOWER EXTREMITY ANGIOGRAPHY;  Surgeon: Algernon Huxley, MD;  Location: [employer] [facility];  Service: Cardiovascular;  Laterality: Left;  . LOWER EXTREMITY ANGIOGRAPHY Right 12/20/2018   Procedure: Lower Extremity Angiography;  Surgeon: Algernon Huxley, MD;  Location: [place] [facility];  Service: Cardiovascular;  Laterality: Right;  . TRANSMETATARSAL AMPUTATION Bilateral 10/13/2018   Procedure: 1.  Amputation right great toe MTPJ 2.  Amputation right second toe MTPJ 3.  Amputation right fourth toe MTPJ 4.  Amputation right fifth toe MTPJ 5.  Excision distal second metatarsal head right foot 6.  Excision distal third metatarsal head right foot 7.  Amputation left third toe 8.  Incision and drainage infectionleft 2nd toe amputation site. ;  Surgeon: Samara Deist, DPM;  Location    Prior to Admission medications   Medication Sig Start Date End Date Taking? Authorizing Provider  allopurinol (ZYLOPRIM) 100 MG tablet Take 1 tablet (100 mg total) by mouth daily. 10/24/19   Ezekiel Slocumb, DO  aspirin 81 MG EC tablet Take 1 tablet (81 mg total) by mouth daily. 10/24/19   Ezekiel Slocumb, DO  atorvastatin (LIPITOR) 10 MG tablet Take 1 tablet (10 mg total) by mouth daily at 6 PM. 10/24/19   Nicole Kindred A, DO  calcium acetate (PHOSLO) 667 MG capsule Take 2 capsules (1,334 mg total) by mouth 3 (three) times daily with meals. Patient not taking: Reported on 10/22/2019 12/21/18   Fritzi Mandes, MD  clopidogrel (PLAVIX) 75 MG tablet Take 1 tablet (75 mg total) by mouth daily. 10/24/19   Nicole Kindred A, DO  escitalopram (LEXAPRO) 10 MG tablet Take 1 tablet (10 mg total) by mouth daily. 10/24/19   Ezekiel Slocumb, DO  nicotine (NICODERM CQ - DOSED IN MG/24 HOURS) 21 mg/24hr patch Place 1 patch (21 mg total) onto the skin daily. 11/06/19    Sheikh, Omair Latif, DO  ondansetron (ZOFRAN) 4 MG tablet Take 1 tablet (4 mg total) by mouth every 6 (six) hours as needed for nausea. 11/05/19   Raiford Noble Latif, DO  oxyCODONE (OXY IR/ROXICODONE) 5 MG immediate release tablet Take 1 tablet (5 mg total) by mouth every 6 (six) hours as needed for severe pain. 11/05/19   Raiford Noble Latif, DO  tamsulosin (FLOMAX) 0.4 MG CAPS capsule Take 1 capsule (0.4 mg total) by mouth daily. 10/25/19   Ezekiel Slocumb, DO  traMADol Veatrice Bourbon) 50 MG tablet 50 mg. 03/16/19   [provider]    Allergies Penicillin g  Family History  Problem Relation Age of Onset  . Varicose Veins Neg Hx     Social History Social History   Tobacco Use  . Smoking status: Current Some Day Smoker    Packs/day: 0.10    Types: Cigarettes  . Smokeless tobacco: Never Used  Vaping Use  . Vaping Use: Never used  Substance Use Topics  .  Alcohol use: Not Currently    Alcohol/week: 0.0 standard drinks  . Drug use: Never    Review of Systems  Constitutional: No fever/chills Eyes: No visual changes. ENT: No sore throat. Cardiovascular: Denies chest pain. Respiratory: Denies shortness of breath. Gastrointestinal: No abdominal pain.  No nausea, no vomiting.  No diarrhea.  No constipation. Genitourinary: Negative for dysuria. Musculoskeletal: Negative for back pain. Skin: Negative for rash. Neurological: Negative for headaches, focal weakness  ____________________________________________   PHYSICAL EXAM:  VITAL SIGNS: ED Triage Vitals  Enc Vitals Group     BP 11/11/19 1218 (!) 150/76     Pulse Rate 11/11/19 1218 70     Resp 11/11/19 1218 14     Temp 11/11/19 1218 98.3 F (36.8 C)     Temp Source 11/11/19 1218 Oral     SpO2 11/11/19 1218 98 %     Weight 11/11/19 1219 156 lb (70.8 kg)     Height 11/11/19 1219 6\' 1"  (1.854 m)     Head Circumference --      Peak Flow --      Pain Score 11/11/19 1219 0     Pain Loc --      Pain Edu? --      Excl.  in [place]? --     Constitutional: Alert and oriented. Well appearing and in no acute distress. Eyes: Conjunctivae are normal.  Head: Atraumatic. Nose: No congestion/rhinnorhea. Mouth/Throat: Mucous membranes are moist.  Oropharynx non-erythematous. Neck: No stridor.   Cardiovascular: Normal rate, regular rhythm. Grossly normal heart sounds.  Good peripheral circulation. Respiratory: Normal respiratory effort.  No retractions. Lungs CTAB.  Patient has a catheter in the left side of the abdomen lower chest Gastrointestinal: Soft and nontender. No distention. No abdominal bruits. Musculoskeletal: No lower extremity tenderness nor edema.   Neurologic:  Normal speech and language. No gross focal neurologic deficits are appreciated.  Skin:  Skin is warm, dry and intact. No rash noted.   ____________________________________________   LABS (all labs ordered are listed, but only abnormal results are displayed)  Labs Reviewed - No data to display ____________________________________________  EKG   ____________________________________________  RADIOLOGY  ED MD interpretation: Chest x-ray read by radiology reviewed by me shows a catheter in good position.  Small effusion no other problems.  Official radiology report(s): DG Chest 2 View  Result Date: 11/11/2019 CLINICAL DATA:  Pleural drainage catheter with effusion EXAM: CHEST - 2 VIEW COMPARISON:  November 04, 2019 FINDINGS: Chest tube/pleural drainage catheter tip is in the medial mid to upper left hemithorax posteriorly, unchanged. There is no pneumothorax. Small residual left pleural effusion present with scattered areas of atelectatic change in the left mid and lower lung regions. No consolidation evident. Right lung clear. Heart is upper normal in size with pulmonary vascularity normal. There is aortic atherosclerosis. There is midthoracic dextroscoliosis. IMPRESSION: Drainage catheter on the left is located posteriorly at the mid to upper  hemithorax level. No pneumothorax. Small left pleural effusion with scattered areas of atelectatic change in the left mid and lower lung regions. Right lung clear. Cardiac silhouette unremarkable. Aortic Atherosclerosis (ICD10-I70.0). Electronically Signed   By: Lowella Grip III M.D.   On: 11/11/2019 18:02    ____________________________________________   PROCEDURES  Procedure(s) performed (including Critical Care):  Procedures   ____________________________________________   INITIAL IMPRESSION / ASSESSMENT AND PLAN / ED COURSE  Care management has been able to find another company to assume care of his catheter.  We will change the  dressing and let him go.              ____________________________________________   FINAL CLINICAL IMPRESSION(S) / ED DIAGNOSES  Final diagnoses:  Follow-up visit after completion of treatment     ED Discharge Orders    None       Note:  This document was prepared using Dragon voice recognition software and may include unintentional dictation errors.    Nena Polio, MD 11/11/19 1843

## 2019-11-11 NOTE — ED Notes (Addendum)
Niece updated with verbal permission. Family to come pick up

## 2019-11-14 ENCOUNTER — Ambulatory Visit: Admission: RE | Admit: 2019-11-14 | Payer: Medicare Other | Source: Ambulatory Visit

## 2019-11-15 ENCOUNTER — Telehealth: Payer: Self-pay | Admitting: *Deleted

## 2019-11-15 NOTE — Telephone Encounter (Signed)
Multiple attempts made to contact pt to remind him of his appt scheduled with Dr. Tasia Catchings tomorrow (6/16) at 2:45pm. Able to leave messages on 2 of the 3 contact numbers listed on pt's chart. Contact info included in message and requested that pt call back if needs to reschedule. If pt does not show for follow up appt tomorrow, certified letter will be mailed to patient.

## 2019-11-16 ENCOUNTER — Encounter: Payer: Self-pay | Admitting: *Deleted

## 2019-11-16 ENCOUNTER — Inpatient Hospital Stay: Payer: Medicare Other | Attending: Oncology | Admitting: Oncology

## 2019-11-16 NOTE — Progress Notes (Signed)
  Oncology Nurse Navigator Documentation  Navigator Location: [facility] (11/16/19 1600)   )Navigator Encounter Type: Letter/Fax/Email (11/16/19 1600)                         Barriers/Navigation Needs: Coordination of Care (11/16/19 1600)   Interventions: Coordination of Care (11/16/19 1600)   Coordination of Care: Appts (11/16/19 1600)         pt was a no show for MRI appt and follow up appt with Dr. Tasia Catchings. Several attempts made to contact pt to reschedule appts without success. Certified letter mailed to patient to call back to reschedule his missed appts. Included my direct contact info with letter to help expedite rescheduling appts.          Time Spent with Patient: 30 (11/16/19 1600)

## 2019-11-24 ENCOUNTER — Encounter: Payer: Self-pay | Admitting: Oncology

## 2019-11-24 NOTE — Telephone Encounter (Signed)
Pathology report scanned in Media.

## 2019-11-25 ENCOUNTER — Encounter: Payer: Self-pay | Admitting: Oncology

## 2019-12-01 ENCOUNTER — Telehealth: Payer: Self-pay | Admitting: *Deleted

## 2019-12-01 NOTE — Telephone Encounter (Addendum)
After several attempts to contact pt and still not being able to reach him In reference to  getting his MRI Brain scan and MD F/U appts R/S. I was able to get  in touch with pts son "Jose [name]" and made him aware of all the No Show appts. He stated that he would try to reach out to his father to see what his plan was and to have him contact the office asap.

## 2019-12-23 ENCOUNTER — Ambulatory Visit: Payer: Medicare Other | Admitting: Oncology

## 2019-12-23 ENCOUNTER — Ambulatory Visit
Admission: RE | Admit: 2019-12-23 | Discharge: 2019-12-23 | Disposition: A | Discharge status: Home / Self Care | Payer: Medicare Other | Source: Ambulatory Visit | Attending: Oncology | Admitting: Oncology

## 2019-12-23 ENCOUNTER — Inpatient Hospital Stay: Payer: Medicare Other | Attending: Oncology | Admitting: Oncology

## 2019-12-23 ENCOUNTER — Other Ambulatory Visit: Payer: Medicare Other

## 2019-12-23 ENCOUNTER — Encounter: Payer: Self-pay | Admitting: Oncology

## 2019-12-23 ENCOUNTER — Encounter: Payer: Self-pay | Admitting: *Deleted

## 2019-12-23 ENCOUNTER — Inpatient Hospital Stay: Payer: Medicare Other

## 2019-12-23 ENCOUNTER — Other Ambulatory Visit: Payer: Self-pay

## 2019-12-23 VITALS — BP 121/70 | HR 90 | Temp 99.2°F | Resp 18 | Ht 73.0 in | Wt 138.4 lb

## 2019-12-23 DIAGNOSIS — Z7189 Other specified counseling: Secondary | ICD-10-CM | POA: Diagnosis not present

## 2019-12-23 DIAGNOSIS — Z8673 Personal history of transient ischemic attack (TIA), and cerebral infarction without residual deficits: Secondary | ICD-10-CM | POA: Diagnosis not present

## 2019-12-23 DIAGNOSIS — N186 End stage renal disease: Secondary | ICD-10-CM | POA: Diagnosis not present

## 2019-12-23 DIAGNOSIS — J910 Malignant pleural effusion: Secondary | ICD-10-CM | POA: Diagnosis not present

## 2019-12-23 DIAGNOSIS — Z992 Dependence on renal dialysis: Secondary | ICD-10-CM | POA: Diagnosis not present

## 2019-12-23 DIAGNOSIS — Z8546 Personal history of malignant neoplasm of prostate: Secondary | ICD-10-CM | POA: Diagnosis not present

## 2019-12-23 DIAGNOSIS — C3492 Malignant neoplasm of unspecified part of left bronchus or lung: Secondary | ICD-10-CM | POA: Diagnosis not present

## 2019-12-23 DIAGNOSIS — C3412 Malignant neoplasm of upper lobe, left bronchus or lung: Secondary | ICD-10-CM | POA: Diagnosis not present

## 2019-12-23 DIAGNOSIS — E785 Hyperlipidemia, unspecified: Secondary | ICD-10-CM | POA: Insufficient documentation

## 2019-12-23 DIAGNOSIS — F17210 Nicotine dependence, cigarettes, uncomplicated: Secondary | ICD-10-CM | POA: Insufficient documentation

## 2019-12-23 DIAGNOSIS — C3490 Malignant neoplasm of unspecified part of unspecified bronchus or lung: Secondary | ICD-10-CM | POA: Insufficient documentation

## 2019-12-23 DIAGNOSIS — Z79899 Other long term (current) drug therapy: Secondary | ICD-10-CM | POA: Diagnosis not present

## 2019-12-23 DIAGNOSIS — F329 Major depressive disorder, single episode, unspecified: Secondary | ICD-10-CM | POA: Insufficient documentation

## 2019-12-23 DIAGNOSIS — Z7982 Long term (current) use of aspirin: Secondary | ICD-10-CM | POA: Insufficient documentation

## 2019-12-23 MED ORDER — GADOBUTROL 1 MMOL/ML IV SOLN
6.0000 mL | Freq: Once | INTRAVENOUS | Status: AC | PRN
  Administered 2019-12-23: 6 mL via INTRAVENOUS

## 2019-12-23 NOTE — Progress Notes (Signed)
  Oncology Nurse Navigator Documentation  Navigator Location: [facility] (12/23/19 1200)   )Navigator Encounter Type: Follow-up Appt (12/23/19 1200)                     Patient Visit Type: MedOnc (12/23/19 1200) Treatment Phase: Pre-Tx/Tx Discussion (12/23/19 1200) Barriers/Navigation Needs: Coordination of Care;Morbidities/Frailty;Transportation;Education (12/23/19 1200) Education: Newly Diagnosed Cancer Education;Understanding Cancer/ Treatment Options (12/23/19 1200) Interventions: Coordination of Care;Education;Referrals (12/23/19 1200) Referrals: Social Work (12/23/19 1200) Coordination of Care: Appts;Chemo;Radiology (12/23/19 1200) Education Method: Written (12/23/19 1200)      Acuity: Level 3-Moderate Needs (3-4 Barriers Identified) (12/23/19 1200)      met with patient during hospital follow up visit with Dr. Tasia Catchings to discuss results and treatment options. All questions answered during visit. Pt given resources regarding diagnosis and supportive services available. All materials and upcoming appts reviewed with pt's nephew, Coralyn Mark. Informed that will notify transportation services to schedule pt for transport for upcoming appts as well. Contact info given and instructed to call with any further questions or needs. Pt and his nephew verbalized understanding. Nothing further needed at this time.   Time Spent with Patient: 60 (12/23/19 1200)

## 2019-12-23 NOTE — Progress Notes (Signed)
START ON PATHWAY REGIMEN - Non-Small Cell Lung     A cycle is 21 days:     Pembrolizumab   **Always confirm dose/schedule in your pharmacy ordering system**  Patient Characteristics: Stage IV Metastatic, Nonsquamous, Initial Chemotherapy/Immunotherapy, PS = 0, 1, ALK Rearrangement Negative and ROS1 Rearrangement Negative and NTRK Gene Fusion?Negative and RET Gene Fusion?Negative and EGFR Mutation Negative/Non?Sensitizing, PD-L1  Expression Positive  ? 50% (TPS) and Immunotherapy Candidate Therapeutic Status: Stage IV Metastatic Histology: Nonsquamous Cell ROS1 Rearrangement Status: Negative Other Mutations/Biomarkers: No Other Actionable Mutations NTRK Gene Fusion Status: Negative PD-L1 Expression Status: PD-L1 Positive ? 50% (TPS) Chemotherapy/Immunotherapy LOT: Initial Chemotherapy/Immunotherapy Molecular Targeted Therapy: Not Appropriate MET Exon 14 Mutation Status: Negative RET Gene Fusion Status: Negative ALK Rearrangement Status: Negative EGFR Mutation Status: Negative/Wild Type BRAF [id number] Mutation Status: Negative ECOG Performance Status: 1 Biomarker Assessment Status Confirmation: All Genomic Markers Negative or Only MET+ or BRAF+ Immunotherapy Candidate Status: Candidate for Immunotherapy Intent of Therapy: Non-Curative / Palliative Intent, Discussed with Patient

## 2019-12-23 NOTE — Progress Notes (Signed)
Pt here to establish care. Pt has dialysis on Monday, Tuesday, Thursday & Saturday.

## 2019-12-23 NOTE — Progress Notes (Addendum)
Hematology/Oncology Follow Up Note [facility]  [phone] [phone]  Patient Care Team: [name], MD as PCP - General (Family Medicine) Telford Nab, RN as Oncology Nurse Navigator   Name of the patient: Jose [name]  [id number]  1945/07/29   REASON FOR VISIT  follow-up for lung cancer  INTERVAL HISTORY 74 y.o. with past medical history hemodialysis ESRD, history of prostate cancer, stage IV lung cancer presents for follow-up Patient no showed to his MRI brain appointment and posthospitalization follow-up with me. He presents today to discuss management plan and treatment. Patient has left pleural catheter placed, per patient he has home care nurse checking in and draining the pleural catheter.  He denies any shortness of breath at resting or with exertion. Denies any chest pain.  Appetite is fair.  Denies any headache or vision changes or abdominal pain. He lives at home with sister.  His nephew sometimes helps him with transportation.  Identified biggest barrier for him for follow-up visits/treatment visits is transportation.  He is on dialysis 4 days/week  Review of Systems  Constitutional: Positive for fatigue. Negative for appetite change, chills, fever and unexpected weight change.  HENT:   Negative for hearing loss and voice change.   Eyes: Negative for eye problems and icterus.  Respiratory: Negative for chest tightness, cough and shortness of breath.   Cardiovascular: Negative for chest pain and leg swelling.  Gastrointestinal: Negative for abdominal distention and abdominal pain.  Endocrine: Negative for hot flashes.  Genitourinary: Negative for difficulty urinating, dysuria and frequency.   Musculoskeletal: Negative for arthralgias.  Skin: Negative for itching and rash.  Neurological: Negative for light-headedness and numbness.  Hematological: Negative for adenopathy. Does not bruise/bleed easily.    Psychiatric/Behavioral: Negative for confusion.      Allergies  Allergen Reactions  . Penicillin G Other (See Comments)    Other reaction(s): Unknown DID THE REACTION INVOLVE: Swelling of the face/tongue/throat, SOB, or low BP? Unknown Sudden or severe rash/hives, skin peeling, or the inside of the mouth or nose? Unknown Did it require medical treatment? Unknown When did it last happen?unknown If all above answers are "NO", may proceed with cephalosporin use.      Past Medical History:  Diagnosis Date  . Chronic kidney disease   . Depression   . Diabetes mellitus without complication ([place])   . Glaucoma   . Glaucoma   . Gout   . Heart murmur   . HLD (hyperlipidemia)   . Hypertension   . Prostate cancer ([place])   . Renal failure    dialysis t/t/s  . Stroke ([place])    tia x 2     Past Surgical History:  Procedure Laterality Date  . AMPUTATION TOE Bilateral 07/23/2018   Procedure: TOE MPJ RIGHT 3RD AND LEFT 2ND;  Surgeon: Samara Deist, DPM;  Location: [facility];  Service: Podiatry;  Laterality: Bilateral;  . APPENDECTOMY    . AV FISTULA PLACEMENT Left 06/09/2018   Procedure: ARTERIOVENOUS (AV) FISTULA CREATION ( BRACHIAL CEPHALIC);  Surgeon: Algernon Huxley, MD;  Location: [facility];  Service: Vascular;  Laterality: Left;  . CHOLECYSTECTOMY    . DIALYSIS/PERMA CATHETER INSERTION N/A 03/24/2018   Procedure: DIALYSIS/PERMA CATHETER INSERTION;  Surgeon: Katha Cabal, MD;  Location: [place] [facility];  Service: Cardiovascular;  Laterality: N/A;  . DIALYSIS/PERMA CATHETER REMOVAL N/A 09/06/2018   Procedure: DIALYSIS/PERMA CATHETER REMOVAL;  Surgeon: Algernon Huxley, MD;  Location: [place] [facility];  Service: Cardiovascular;  Laterality: N/A;  . INSERTION PROSTATE RADIATION SEED    . IR PERC PLEURAL DRAIN W/INDWELL CATH W/IMG GUIDE  11/03/2019  . IRRIGATION AND DEBRIDEMENT FOOT Right 12/17/2018   Procedure: IRRIGATION AND DEBRIDEMENT FOOT;  Surgeon: Samara Deist, DPM;   Location: [facility];  Service: Podiatry;  Laterality: Right;  . LOWER EXTREMITY ANGIOGRAPHY Right 06/16/2018   Procedure: LOWER EXTREMITY ANGIOGRAPHY;  Surgeon: Algernon Huxley, MD;  Location: [place] [facility];  Service: Cardiovascular;  Laterality: Right;  . LOWER EXTREMITY ANGIOGRAPHY Left 08/30/2018   Procedure: LOWER EXTREMITY ANGIOGRAPHY;  Surgeon: Algernon Huxley, MD;  Location: [place] [facility];  Service: Cardiovascular;  Laterality: Left;  . LOWER EXTREMITY ANGIOGRAPHY Right 09/30/2018   Procedure: LOWER EXTREMITY ANGIOGRAPHY;  Surgeon: Algernon Huxley, MD;  Location: [place] [facility];  Service: Cardiovascular;  Laterality: Right;  . LOWER EXTREMITY ANGIOGRAPHY Left 10/07/2018   Procedure: LOWER EXTREMITY ANGIOGRAPHY;  Surgeon: Algernon Huxley, MD;  Location: [place] [facility];  Service: Cardiovascular;  Laterality: Left;  . LOWER EXTREMITY ANGIOGRAPHY Right 12/20/2018   Procedure: Lower Extremity Angiography;  Surgeon: Algernon Huxley, MD;  Location: [place] [facility];  Service: Cardiovascular;  Laterality: Right;  . TRANSMETATARSAL AMPUTATION Bilateral 10/13/2018   Procedure: 1.  Amputation right great toe MTPJ 2.  Amputation right second toe MTPJ 3.  Amputation right fourth toe MTPJ 4.  Amputation right fifth toe MTPJ 5.  Excision distal second metatarsal head right foot 6.  Excision distal third metatarsal head right foot 7.  Amputation left third toe 8.  Incision and drainage infectionleft 2nd toe amputation site. ;  Surgeon: Samara Deist, DPM;  Location    Social History   Socioeconomic History  . Marital status: Widowed    Spouse name: Not on file  . Number of children: Not on file  . Years of education: Not on file  . Highest education level: Not on file  Occupational History    Employer: RETIRED  Tobacco Use  . Smoking status: Current Some Day Smoker    Packs/day: 0.10    Types: Cigarettes  . Smokeless tobacco: Never Used  . Tobacco comment: pt currently smokes about 3  cigarettes a day   Vaping Use  . Vaping Use: Never used  Substance and Sexual Activity  . Alcohol use: Not Currently    Alcohol/week: 0.0 standard drinks  . Drug use: Never  . Sexual activity: Not Currently  Other Topics Concern  . Not on file  Social History Narrative  . Not on file   Social Determinants of Health   Financial Resource Strain:   . Difficulty of Paying Living Expenses:   Food Insecurity:   . Worried About Charity fundraiser in the Last Year:   . Arboriculturist in the Last Year:   Transportation Needs:   . Film/video editor (Medical):   Marland Kitchen Lack of Transportation (Non-Medical):   Physical Activity:   . Days of Exercise per Week:   . Minutes of Exercise per Session:   Stress:   . Feeling of Stress :   Social Connections:   . Frequency of Communication with Friends and Family:   . Frequency of Social Gatherings with Friends and Family:   . Attends Religious Services:   . Active Member of Clubs or Organizations:   . Attends Archivist Meetings:   Marland Kitchen Marital Status:   Intimate Partner Violence:   . Fear of Current or Ex-Partner:   .  Emotionally Abused:   Marland Kitchen Physically Abused:   . Sexually Abused:     Family History  Problem Relation Age of Onset  . Varicose Veins Neg Hx      Current Outpatient Medications:  .  allopurinol (ZYLOPRIM) 100 MG tablet, Take 1 tablet (100 mg total) by mouth daily., Disp: 30 tablet, Rfl: 2 .  aspirin 81 MG EC tablet, Take 1 tablet (81 mg total) by mouth daily., Disp: 30 tablet, Rfl: 2 .  atorvastatin (LIPITOR) 10 MG tablet, Take 1 tablet (10 mg total) by mouth daily at 6 PM., Disp: 30 tablet, Rfl: 2 .  calcium acetate (PHOSLO) 667 MG capsule, Take 2 capsules (1,334 mg total) by mouth 3 (three) times daily with meals. (Patient not taking: Reported on 10/22/2019), Disp: 90 capsule, Rfl: 0 .  clopidogrel (PLAVIX) 75 MG tablet, Take 1 tablet (75 mg total) by mouth daily., Disp: 30 tablet, Rfl: 2 .  escitalopram  (LEXAPRO) 10 MG tablet, Take 1 tablet (10 mg total) by mouth daily., Disp: 30 tablet, Rfl: 2 .  nicotine (NICODERM CQ - DOSED IN MG/24 HOURS) 21 mg/24hr patch, Place 1 patch (21 mg total) onto the skin daily., Disp: 28 patch, Rfl: 0 .  ondansetron (ZOFRAN) 4 MG tablet, Take 1 tablet (4 mg total) by mouth every 6 (six) hours as needed for nausea., Disp: 20 tablet, Rfl: 0 .  oxyCODONE (OXY IR/ROXICODONE) 5 MG immediate release tablet, Take 1 tablet (5 mg total) by mouth every 6 (six) hours as needed for severe pain., Disp: 10 tablet, Rfl: 0 .  tamsulosin (FLOMAX) 0.4 MG CAPS capsule, Take 1 capsule (0.4 mg total) by mouth daily., Disp: 30 capsule, Rfl: 2 .  traMADol (ULTRAM) 50 MG tablet, 50 mg., Disp: , Rfl:   Physical exam:  Vitals:   12/23/19 1018  BP: 121/70  Pulse: 90  Resp: 18  Temp: 99.2 F (37.3 C)  SpO2: 100%  Weight: 138 lb 6.4 oz (62.8 kg)  Height: 6' 1"  (1.854 m)   Physical Exam Constitutional:      General: He is not in acute distress. HENT:     Head: Normocephalic and atraumatic.  Eyes:     General: No scleral icterus. Cardiovascular:     Rate and Rhythm: Normal rate and regular rhythm.     Heart sounds: Normal heart sounds.  Pulmonary:     Effort: Pulmonary effort is normal. No respiratory distress.     Breath sounds: No wheezing.  Abdominal:     General: Bowel sounds are normal. There is no distension.     Palpations: Abdomen is soft.  Musculoskeletal:        General: No deformity. Normal range of motion.     Cervical back: Normal range of motion and neck supple.  Skin:    General: Skin is warm and dry.     Findings: No erythema or rash.  Neurological:     Mental Status: He is alert and oriented to person, place, and time. Mental status is at baseline.     Cranial Nerves: No cranial nerve deficit.     Coordination: Coordination normal.  Psychiatric:        Mood and Affect: Mood normal.     CMP Latest Ref Rng & Units 11/05/2019  Glucose 70 - 99 mg/dL 99    BUN 8 - 23 mg/dL 47(H)  Creatinine 0.61 - 1.24 mg/dL 5.81(H)  Sodium 135 - 145 mmol/L 136  Potassium 3.5 - 5.1 mmol/L 5.4(H)  Chloride 98 - 111 mmol/L 96(L)  CO2 22 - 32 mmol/L 29  Calcium 8.9 - 10.3 mg/dL 8.8(L)  Total Protein 6.5 - 8.1 g/dL 6.3(L)  Total Bilirubin 0.3 - 1.2 mg/dL 0.5  Alkaline Phos 38 - 126 U/L 42  AST 15 - 41 U/L 12(L)  ALT 0 - 44 U/L 8   CBC Latest Ref Rng & Units 11/05/2019  WBC 4.0 - 10.5 K/uL 9.5  Hemoglobin 13.0 - 17.0 g/dL 11.8(L)  Hematocrit 39 - 52 % 36.6(L)  Platelets 150 - 400 K/uL 222    RADIOGRAPHIC STUDIES: I have personally reviewed the radiological images as listed and agreed with the findings in the report. MR Brain W Wo Contrast  Result Date: 12/23/2019 CLINICAL DATA:  Lung cancer staging. EXAM: MRI HEAD WITHOUT AND WITH CONTRAST TECHNIQUE: Multiplanar, multiecho pulse sequences of the brain and surrounding structures were obtained without and with intravenous contrast. CONTRAST:  85m GADAVIST GADOBUTROL 1 MMOL/ML IV SOLN COMPARISON:  03/08/2007 MRI head. FINDINGS: Brain: No focal diffusion-weighted signal abnormality. Moderate diffuse parenchymal volume loss with ex vacuo dilatation. Scattered and confluent T2 hyperintense foci involving the supratentorial and infratentorial white matter. Sequela of remote bilateral centrum semiovale, left corona radiata and bilateral thalamic infarcts. Also small remote bilateral cerebellar infarcts. Prominent bilateral basal ganglia perivascular spaces. No midline shift or extra-axial fluid collection. Tiny remote microhemorrhages involving the bilateral cerebrum and right thalamus. No abnormal enhancement. Vascular: Nonocclusive thrombus within the left V4 segment (18:33) with slow flow. Query moderate narrowing of the left greater than right supraclinoid ICAs. Otherwise normal flow voids tiny left occipital developmental venous anomaly. Skull and upper cervical spine: Upper cervical spondylosis. No focal enhancing  osseous lesion. Sinuses/Orbits: Normal orbits. Clear paranasal sinuses. No mastoid effusion. Other: None. IMPRESSION: No evidence of intracranial metastases. Remote bilateral centrum semiovale, left corona radiata, bilateral thalamic and bilateral cerebellar infarcts. Moderate cerebral atrophy. Moderate chronic microvascular ischemic changes. Nonocclusive left V4 segment thrombus with slow flow. Moderate narrowing of the bilateral supraclinoid ICAs. Consider MRA head for further evaluation. Electronically Signed   By: CPrimitivo GauzeM.D.   On: 12/23/2019 13:33     Assessment and plan  1. Malignant pleural effusion   2. Malignant neoplasm of upper lobe of left lung ([place]   3. 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  Recommend patient to proceed with MRI brain. It has been 2 months since his last CT scan, I will obtain PET scan for reevaluation and get a new baseline. Malignant pleural effusion, status post pleural catheter placement.  Continue drain as needed. Omniseq results showed PD-L1 IHC + TPS 70%  I discussed the mechanism of action and rationale of using immunotherapy Keytruda.  The goal of therapy is palliative; and length of treatments are likely ongoing/based upon the results of the scans. Discussed the potential side effects of immunotherapy including but not limited to diarrhea; skin rash; respiratory failure, kidney failure, mental status change, elevated LFTs/liver failure,endocrine abnormalities, acute deterioration  and even death,etc. patient voices understanding and agrees with proceeding with treatment. Goals of care was discussed.  Palliative intent.  Patient understands that his condition is not curable. We will arrange patient to have transportation assistance for his cancer treatments visits. I will arrange patient to attend chemotherapy class.  Hopefully to  start treatment in 1 to 2 weeks. Refer to establish care with palliative care service.   Orders  Placed This Encounter  Procedures  . NM PET Image Initial (PI) Skull Base To Thigh    Standing Status:   Future    Standing Expiration Date:   12/22/2020    Order Specific Question:   If indicated for the ordered procedure, I authorize the administration of a radiopharmaceutical per Radiology protocol    Answer:   Yes    Order Specific Question:   Preferred imaging location?    Answer:   [facility]    Order Specific Question:   Radiology Contrast Protocol - do NOT remove file path    Answer:   [email or web address]        Earlie Server, MD, PhD Hematology Oncology [facility] Pager- [phone] 12/23/2019

## 2019-12-28 NOTE — Progress Notes (Signed)
Pharmacist Chemotherapy Monitoring - Initial Assessment    Anticipated start date: 01/04/20  Regimen:   Are orders appropriate based on the patients diagnosis, regimen, and cycle? Yes  Does the plan date match the patients scheduled date? Yes  Is the sequencing of drugs appropriate? Yes  Are the premedications appropriate for the patients regimen? Yes  Prior Authorization for treatment is: Approved o If applicable, is the correct biosimilar selected based on the patient's insurance? not applicable  Organ Function and Labs:  Are dose adjustments needed based on the patient's renal function, hepatic function, or hematologic function? No  Are appropriate labs ordered prior to the start of patient's treatment? Yes  Other organ system assessment, if indicated: N/A  The following baseline labs, if indicated, have been ordered: pembrolizumab: baseline TSH +/- T4  Dose Assessment:  Are the drug doses appropriate? Yes  Are the following correct: o Drug concentrations Yes o IV fluid compatible with drug Yes o Administration routes Yes o Timing of therapy Yes  If applicable, does the patient have documented access for treatment and/or plans for port-a-cath placement? no  If applicable, have lifetime cumulative doses been properly documented and assessed? no Lifetime Dose Tracking  No doses have been documented on this patient for the following tracked chemicals: Doxorubicin, Epirubicin, Idarubicin, Daunorubicin, Mitoxantrone, Bleomycin, Oxaliplatin, Carboplatin, Liposomal Doxorubicin  o   Toxicity Monitoring/Prevention:  The patient has the following take home antiemetics prescribed: Ondansetron  The patient has the following take home medications prescribed: N/A  Medication allergies and previous infusion related reactions, if applicable, have been reviewed and addressed. Yes  The patient's current medication list has been assessed for drug-drug interactions with their  chemotherapy regimen. no significant drug-drug interactions were identified on review.  Order Review:  Are the treatment plan orders signed? Yes  Is the patient scheduled to see a provider prior to their treatment? Yes  I verify that I have reviewed each item in the above checklist and answered each question accordingly.  Adelina Mings 12/28/2019 11:34 AM

## 2019-12-30 ENCOUNTER — Inpatient Hospital Stay: Payer: Medicare Other | Admitting: Oncology

## 2019-12-30 ENCOUNTER — Inpatient Hospital Stay: Payer: Medicare Other

## 2020-01-02 ENCOUNTER — Other Ambulatory Visit: Payer: Self-pay

## 2020-01-02 ENCOUNTER — Inpatient Hospital Stay: Payer: Medicare Other | Attending: Oncology

## 2020-01-02 ENCOUNTER — Inpatient Hospital Stay (HOSPITAL_BASED_OUTPATIENT_CLINIC_OR_DEPARTMENT_OTHER): Payer: Medicare Other | Admitting: Oncology

## 2020-01-02 DIAGNOSIS — Z8546 Personal history of malignant neoplasm of prostate: Secondary | ICD-10-CM | POA: Diagnosis not present

## 2020-01-02 DIAGNOSIS — Z79899 Other long term (current) drug therapy: Secondary | ICD-10-CM | POA: Insufficient documentation

## 2020-01-02 DIAGNOSIS — D649 Anemia, unspecified: Secondary | ICD-10-CM | POA: Diagnosis not present

## 2020-01-02 DIAGNOSIS — E1122 Type 2 diabetes mellitus with diabetic chronic kidney disease: Secondary | ICD-10-CM | POA: Insufficient documentation

## 2020-01-02 DIAGNOSIS — C3412 Malignant neoplasm of upper lobe, left bronchus or lung: Secondary | ICD-10-CM | POA: Insufficient documentation

## 2020-01-02 DIAGNOSIS — F17210 Nicotine dependence, cigarettes, uncomplicated: Secondary | ICD-10-CM | POA: Diagnosis not present

## 2020-01-02 DIAGNOSIS — I132 Hypertensive heart and chronic kidney disease with heart failure and with stage 5 chronic kidney disease, or end stage renal disease: Secondary | ICD-10-CM | POA: Insufficient documentation

## 2020-01-02 DIAGNOSIS — Z8673 Personal history of transient ischemic attack (TIA), and cerebral infarction without residual deficits: Secondary | ICD-10-CM | POA: Diagnosis not present

## 2020-01-02 DIAGNOSIS — Z7982 Long term (current) use of aspirin: Secondary | ICD-10-CM | POA: Insufficient documentation

## 2020-01-02 DIAGNOSIS — E785 Hyperlipidemia, unspecified: Secondary | ICD-10-CM | POA: Diagnosis not present

## 2020-01-02 DIAGNOSIS — N186 End stage renal disease: Secondary | ICD-10-CM | POA: Diagnosis not present

## 2020-01-02 DIAGNOSIS — J449 Chronic obstructive pulmonary disease, unspecified: Secondary | ICD-10-CM | POA: Diagnosis not present

## 2020-01-02 DIAGNOSIS — Z923 Personal history of irradiation: Secondary | ICD-10-CM | POA: Insufficient documentation

## 2020-01-02 DIAGNOSIS — Z992 Dependence on renal dialysis: Secondary | ICD-10-CM | POA: Diagnosis not present

## 2020-01-02 DIAGNOSIS — Z5112 Encounter for antineoplastic immunotherapy: Secondary | ICD-10-CM | POA: Insufficient documentation

## 2020-01-02 DIAGNOSIS — I509 Heart failure, unspecified: Secondary | ICD-10-CM | POA: Insufficient documentation

## 2020-01-02 DIAGNOSIS — F329 Major depressive disorder, single episode, unspecified: Secondary | ICD-10-CM | POA: Insufficient documentation

## 2020-01-02 NOTE — Progress Notes (Addendum)
[place]  [phone] [phone]  Patient Care Team: [name], MD as PCP - General (Family Medicine) Telford Nab, RN as Oncology Nurse Navigator   Name of the patient: Dunbar Buras  [id number]  [birth year]   Date of visit: 01/02/20  Diagnosis- Lung Cancer   Chief complaint/Reason for visit- Initial Meeting for [facility], preparing for starting chemotherapy  Heme/Onc history:  Oncology History  Primary adenocarcinoma of left lung ([place])  11/04/2019 Initial Diagnosis   Primary adenocarcinoma of left lung ([place])   12/23/2019 Cancer Staging   Staging form: Lung, AJCC 8th Edition - Clinical: Stage IVA (cT2, cN2, pM1a) - Signed by Earlie Server, MD on 12/23/2019   01/04/2020 -  Chemotherapy   The patient had pembrolizumab (KEYTRUDA) 200 mg in sodium chloride 0.9 % 50 mL chemo infusion, 200 mg, Intravenous, Once, 0 of 6 cycles  for chemotherapy treatment.      Interval history- Mr. Loescher is a 74 yo male who presents to chemo care clinic today for initial meeting in preparation for starting chemotherapy. I introduced the chemo care clinic and we discussed that the role of the clinic is to assist those who are at an increased risk of emergency room visits and/or complications during the course of chemotherapy treatment. We discussed that the increased risk takes into account factors such as age, performance status, and co-morbidities. We also discussed that for some, this might include barriers to care such as not having a primary care provider, lack of insurance/transportation, or not being able to afford medications. We discussed that the goal of the program is to help prevent unplanned ER visits and help reduce complications during chemotherapy. We do this by discussing specific risk factors to each individual and identifying ways that we can help improve these risk factors and reduce barriers to care.    ECOG FS:0 - Asymptomatic  Review of systems- Review of Systems  Constitutional: Negative.  Negative for chills, fever, malaise/fatigue and weight loss.  HENT: Negative for congestion, ear pain and tinnitus.   Eyes: Negative.  Negative for blurred vision and double vision.  Respiratory: Negative.  Negative for cough, sputum production and shortness of breath.   Cardiovascular: Negative.  Negative for chest pain, palpitations and leg swelling.  Gastrointestinal: Negative.  Negative for abdominal pain, constipation, diarrhea, nausea and vomiting.  Genitourinary: Negative for dysuria, frequency and urgency.  Musculoskeletal: Negative for back pain and falls.  Skin: Negative.  Negative for rash.  Neurological: Negative.  Negative for weakness and headaches.  Endo/Heme/Allergies: Negative.  Does not bruise/bleed easily.  Psychiatric/Behavioral: Negative.  Negative for depression. The patient is not nervous/anxious and does not have insomnia.      Current treatment- Keytruda  Allergies  Allergen Reactions  . Penicillin G Other (See Comments)    Other reaction(s): Unknown DID THE REACTION INVOLVE: Swelling of the face/tongue/throat, SOB, or low BP? Unknown Sudden or severe rash/hives, skin peeling, or the inside of the mouth or nose? Unknown Did it require medical treatment? Unknown When did it last happen?unknown If all above answers are "NO", may proceed with cephalosporin use.     Past Medical History:  Diagnosis Date  . Chronic kidney disease   . Depression   . Diabetes mellitus without complication ([place])   . Glaucoma   . Glaucoma   . Gout   . Heart murmur   . HLD (hyperlipidemia)   . Hypertension   . Prostate cancer (  [place])   . Renal failure    dialysis t/t/s  . 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 AMPUTATION TOE Bilateral 07/23/2018   Procedure: TOE MPJ RIGHT 3RD AND LEFT 2ND;  Surgeon: Samara Deist, DPM;  Location: [facility]  ORS;  Service: Podiatry;  Laterality: Bilateral;  . APPENDECTOMY    . AV FISTULA PLACEMENT Left 06/09/2018   Procedure: ARTERIOVENOUS (AV) FISTULA CREATION ( BRACHIAL CEPHALIC);  Surgeon: Algernon Huxley, MD;  Location: [facility] ORS;  Service: Vascular;  Laterality: Left;  . CHOLECYSTECTOMY    . DIALYSIS/PERMA CATHETER INSERTION N/A 03/24/2018   Procedure: DIALYSIS/PERMA CATHETER INSERTION;  Surgeon: Katha Cabal, MD;  Location: [place] CV LAB;  Service: Cardiovascular;  Laterality: N/A;  . DIALYSIS/PERMA CATHETER REMOVAL N/A 09/06/2018   Procedure: DIALYSIS/PERMA CATHETER REMOVAL;  Surgeon: Algernon Huxley, MD;  Location: [place] CV LAB;  Service: Cardiovascular;  Laterality: N/A;  . INSERTION PROSTATE RADIATION SEED    . IR PERC PLEURAL DRAIN W/INDWELL CATH W/IMG GUIDE  11/03/2019  . IRRIGATION AND DEBRIDEMENT FOOT Right 12/17/2018   Procedure: IRRIGATION AND DEBRIDEMENT FOOT;  Surgeon: Samara Deist, DPM;  Location: [facility] ORS;  Service: Podiatry;  Laterality: Right;  . LOWER EXTREMITY ANGIOGRAPHY Right 06/16/2018   Procedure: LOWER EXTREMITY ANGIOGRAPHY;  Surgeon: Algernon Huxley, MD;  Location: [place] CV LAB;  Service: Cardiovascular;  Laterality: Right;  . LOWER EXTREMITY ANGIOGRAPHY Left 08/30/2018   Procedure: LOWER EXTREMITY ANGIOGRAPHY;  Surgeon: Algernon Huxley, MD;  Location: [place] CV LAB;  Service: Cardiovascular;  Laterality: Left;  . LOWER EXTREMITY ANGIOGRAPHY Right 09/30/2018   Procedure: LOWER EXTREMITY ANGIOGRAPHY;  Surgeon: Algernon Huxley, MD;  Location: [place] CV LAB;  Service: Cardiovascular;  Laterality: Right;  . LOWER EXTREMITY ANGIOGRAPHY Left 10/07/2018   Procedure: LOWER EXTREMITY ANGIOGRAPHY;  Surgeon: Algernon Huxley, MD;  Location: [place] CV LAB;  Service: Cardiovascular;  Laterality: Left;  . LOWER EXTREMITY ANGIOGRAPHY Right 12/20/2018   Procedure: Lower Extremity Angiography;  Surgeon: Algernon Huxley, MD;  Location: [place] CV LAB;  Service:  Cardiovascular;  Laterality: Right;  . TRANSMETATARSAL AMPUTATION Bilateral 10/13/2018   Procedure: 1.  Amputation right great toe MTPJ 2.  Amputation right second toe MTPJ 3.  Amputation right fourth toe MTPJ 4.  Amputation right fifth toe MTPJ 5.  Excision distal second metatarsal head right foot 6.  Excision distal third metatarsal head right foot 7.  Amputation left third toe 8.  Incision and drainage infectionleft 2nd toe amputation site. ;  Surgeon: Samara Deist, DPM;  Location    Social History   Socioeconomic History  . Marital status: Widowed    Spouse name: Not on file  . Number of children: Not on file  . Years of education: Not on file  . Highest education level: Not on file  Occupational History    Employer: RETIRED  Tobacco Use  . Smoking status: Current Some Day Smoker    Packs/day: 0.10    Types: Cigarettes  . Smokeless tobacco: Never Used  . Tobacco comment: pt currently smokes about 3 cigarettes a day   Vaping Use  . Vaping Use: Never used  Substance and Sexual Activity  . Alcohol use: Not Currently    Alcohol/week: 0.0 standard drinks  . Drug use: Never  . Sexual activity: Not Currently  Other Topics Concern  . Not on file  Social History Narrative  .  Not on file   Social Determinants of Health   Financial Resource Strain:   . Difficulty of Paying Living Expenses:   Food Insecurity:   . Worried About Charity fundraiser in the Last Year:   . Arboriculturist in the Last Year:   Transportation Needs:   . Film/video editor (Medical):   Marland Kitchen Lack of Transportation (Non-Medical):   Physical Activity:   . Days of Exercise per Week:   . Minutes of Exercise per Session:   Stress:   . Feeling of Stress :   Social Connections:   . Frequency of Communication with Friends and Family:   . Frequency of Social Gatherings with Friends and Family:   . Attends Religious Services:   . Active Member of Clubs or Organizations:   . Attends Archivist  Meetings:   Marland Kitchen Marital Status:   Intimate Partner Violence:   . Fear of Current or Ex-Partner:   . Emotionally Abused:   Marland Kitchen Physically Abused:   . Sexually Abused:     Family History  Problem Relation Age of Onset  . Varicose Veins Neg Hx      Current Outpatient Medications:  .  allopurinol (ZYLOPRIM) 100 MG tablet, Take 1 tablet (100 mg total) by mouth daily., Disp: 30 tablet, Rfl: 2 .  aspirin 81 MG EC tablet, Take 1 tablet (81 mg total) by mouth daily., Disp: 30 tablet, Rfl: 2 .  atorvastatin (LIPITOR) 10 MG tablet, Take 1 tablet (10 mg total) by mouth daily at 6 PM., Disp: 30 tablet, Rfl: 2 .  calcium acetate (PHOSLO) 667 MG capsule, Take 2 capsules (1,334 mg total) by mouth 3 (three) times daily with meals. (Patient not taking: Reported on 10/22/2019), Disp: 90 capsule, Rfl: 0 .  clopidogrel (PLAVIX) 75 MG tablet, Take 1 tablet (75 mg total) by mouth daily., Disp: 30 tablet, Rfl: 2 .  escitalopram (LEXAPRO) 10 MG tablet, Take 1 tablet (10 mg total) by mouth daily., Disp: 30 tablet, Rfl: 2 .  nicotine (NICODERM CQ - DOSED IN MG/24 HOURS) 21 mg/24hr patch, Place 1 patch (21 mg total) onto the skin daily., Disp: 28 patch, Rfl: 0 .  ondansetron (ZOFRAN) 4 MG tablet, Take 1 tablet (4 mg total) by mouth every 6 (six) hours as needed for nausea., Disp: 20 tablet, Rfl: 0 .  oxyCODONE (OXY IR/ROXICODONE) 5 MG immediate release tablet, Take 1 tablet (5 mg total) by mouth every 6 (six) hours as needed for severe pain., Disp: 10 tablet, Rfl: 0 .  tamsulosin (FLOMAX) 0.4 MG CAPS capsule, Take 1 capsule (0.4 mg total) by mouth daily., Disp: 30 capsule, Rfl: 2 .  traMADol (ULTRAM) 50 MG tablet, 50 mg., Disp: , Rfl:   Physical exam: There were no vitals filed for this visit. Physical Exam Constitutional:      Appearance: Normal appearance.  HENT:     Head: Normocephalic and atraumatic.  Eyes:     Pupils: Pupils are equal, round, and reactive to light.  Cardiovascular:     Rate and Rhythm:  Normal rate and regular rhythm.     Heart sounds: Normal heart sounds. No murmur heard.   Pulmonary:     Effort: Pulmonary effort is normal.     Breath sounds: Normal breath sounds. No wheezing.  Abdominal:     General: Bowel sounds are normal. There is no distension.     Palpations: Abdomen is soft.     Tenderness: There is no abdominal  tenderness.  Musculoskeletal:        General: Normal range of motion.     Cervical back: Normal range of motion.  Skin:    General: Skin is warm and dry.     Findings: No rash.  Neurological:     Mental Status: He is alert and oriented to person, place, and time.  Psychiatric:        Judgment: Judgment normal.      CMP Latest Ref Rng & Units 11/05/2019  Glucose 70 - 99 mg/dL 99  BUN 8 - 23 mg/dL 47(H)  Creatinine 0.61 - 1.24 mg/dL 5.81(H)  Sodium 135 - 145 mmol/L 136  Potassium 3.5 - 5.1 mmol/L 5.4(H)  Chloride 98 - 111 mmol/L 96(L)  CO2 22 - 32 mmol/L 29  Calcium 8.9 - 10.3 mg/dL 8.8(L)  Total Protein 6.5 - 8.1 g/dL 6.3(L)  Total Bilirubin 0.3 - 1.2 mg/dL 0.5  Alkaline Phos 38 - 126 U/L 42  AST 15 - 41 U/L 12(L)  ALT 0 - 44 U/L 8   CBC Latest Ref Rng & Units 11/05/2019  WBC 4.0 - 10.5 K/uL 9.5  Hemoglobin 13.0 - 17.0 g/dL 11.8(L)  Hematocrit 39 - 52 % 36.6(L)  Platelets 150 - 400 K/uL 222    No images are attached to the encounter.  MR Brain W Wo Contrast  Result Date: 12/23/2019 CLINICAL DATA:  Lung cancer staging. EXAM: MRI HEAD WITHOUT AND WITH CONTRAST TECHNIQUE: Multiplanar, multiecho pulse sequences of the brain and surrounding structures were obtained without and with intravenous contrast. CONTRAST:  88mL GADAVIST GADOBUTROL 1 MMOL/ML IV SOLN COMPARISON:  03/08/2007 MRI head. FINDINGS: Brain: No focal diffusion-weighted signal abnormality. Moderate diffuse parenchymal volume loss with ex vacuo dilatation. Scattered and confluent T2 hyperintense foci involving the supratentorial and infratentorial white matter. Sequela of remote  bilateral centrum semiovale, left corona radiata and bilateral thalamic infarcts. Also small remote bilateral cerebellar infarcts. Prominent bilateral basal ganglia perivascular spaces. No midline shift or extra-axial fluid collection. Tiny remote microhemorrhages involving the bilateral cerebrum and right thalamus. No abnormal enhancement. Vascular: Nonocclusive thrombus within the left V4 segment (18:33) with slow flow. Query moderate narrowing of the left greater than right supraclinoid ICAs. Otherwise normal flow voids tiny left occipital developmental venous anomaly. Skull and upper cervical spine: Upper cervical spondylosis. No focal enhancing osseous lesion. Sinuses/Orbits: Normal orbits. Clear paranasal sinuses. No mastoid effusion. Other: None. IMPRESSION: No evidence of intracranial metastases. Remote bilateral centrum semiovale, left corona radiata, bilateral thalamic and bilateral cerebellar infarcts. Moderate cerebral atrophy. Moderate chronic microvascular ischemic changes. Nonocclusive left V4 segment thrombus with slow flow. Moderate narrowing of the bilateral supraclinoid ICAs. Consider MRA head for further evaluation. Electronically Signed   By: Primitivo Gauze M.D.   On: 12/23/2019 13:33   Assessment and plan- Patient is a 74 y.o. male who presents to [facility] for initial meeting in preparation for starting chemotherapy for the treatment of lung cancer.    HPI: Mr. Hoshino is a 74 year old male with past medical history significant for hemodialysis ESRD, history of prostate cancer and recently diagnosed stage IV lung cancer. He presented back in early June 2021 with progressively worsening shortness of breath.  Imaging showed large amount of pleural effusion on the left side with a spiculated cavitated left upper lobe mass suspicious for malignancy..  He had 2.3 L of pleural fluid removed which was sent for testing.  CT abdomen/pelvis showed irregular and masslike thickening of  the gallbladder was concerning for  metastatic disease.  Pleural fluid was positive for adenocarcinoma of lung origin.  Plan is to begin palliative Keytruda.  Patient has Pleurx catheter placed which is managed by home health agency.  Unfortunately, patient was lost to follow-up.  2. Chemo Care Clinic/High Risk for ER/Hospitalization during chemotherapy- We discussed the role of the chemo care clinic and identified patient specific risk factors. I discussed that patient was identified as high risk primarily based on: stage of disease  Patient has past medical history positive for: Past Medical History:  Diagnosis Date  . Chronic kidney disease   . Depression   . Diabetes mellitus without complication ([place])   . Glaucoma   . Glaucoma   . Gout   . Heart murmur   . HLD (hyperlipidemia)   . Hypertension   . Prostate cancer ([place])   . Renal failure    dialysis t/t/s  . 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 AMPUTATION TOE Bilateral 07/23/2018   Procedure: TOE MPJ RIGHT 3RD AND LEFT 2ND;  Surgeon: Samara Deist, DPM;  Location: [facility] ORS;  Service: Podiatry;  Laterality: Bilateral;  . APPENDECTOMY    . AV FISTULA PLACEMENT Left 06/09/2018   Procedure: ARTERIOVENOUS (AV) FISTULA CREATION ( BRACHIAL CEPHALIC);  Surgeon: Algernon Huxley, MD;  Location: [facility] ORS;  Service: Vascular;  Laterality: Left;  . CHOLECYSTECTOMY    . DIALYSIS/PERMA CATHETER INSERTION N/A 03/24/2018   Procedure: DIALYSIS/PERMA CATHETER INSERTION;  Surgeon: Katha Cabal, MD;  Location: [place] CV LAB;  Service: Cardiovascular;  Laterality: N/A;  . DIALYSIS/PERMA CATHETER REMOVAL N/A 09/06/2018   Procedure: DIALYSIS/PERMA CATHETER REMOVAL;  Surgeon: Algernon Huxley, MD;  Location: [place] CV LAB;  Service: Cardiovascular;  Laterality: N/A;  . INSERTION PROSTATE RADIATION SEED    . IR PERC PLEURAL DRAIN W/INDWELL CATH W/IMG  GUIDE  11/03/2019  . IRRIGATION AND DEBRIDEMENT FOOT Right 12/17/2018   Procedure: IRRIGATION AND DEBRIDEMENT FOOT;  Surgeon: Samara Deist, DPM;  Location: [facility] ORS;  Service: Podiatry;  Laterality: Right;  . LOWER EXTREMITY ANGIOGRAPHY Right 06/16/2018   Procedure: LOWER EXTREMITY ANGIOGRAPHY;  Surgeon: Algernon Huxley, MD;  Location: [place] CV LAB;  Service: Cardiovascular;  Laterality: Right;  . LOWER EXTREMITY ANGIOGRAPHY Left 08/30/2018   Procedure: LOWER EXTREMITY ANGIOGRAPHY;  Surgeon: Algernon Huxley, MD;  Location: [place] CV LAB;  Service: Cardiovascular;  Laterality: Left;  . LOWER EXTREMITY ANGIOGRAPHY Right 09/30/2018   Procedure: LOWER EXTREMITY ANGIOGRAPHY;  Surgeon: Algernon Huxley, MD;  Location: [place] CV LAB;  Service: Cardiovascular;  Laterality: Right;  . LOWER EXTREMITY ANGIOGRAPHY Left 10/07/2018   Procedure: LOWER EXTREMITY ANGIOGRAPHY;  Surgeon: Algernon Huxley, MD;  Location: [place] CV LAB;  Service: Cardiovascular;  Laterality: Left;  . LOWER EXTREMITY ANGIOGRAPHY Right 12/20/2018   Procedure: Lower Extremity Angiography;  Surgeon: Algernon Huxley, MD;  Location: [place] CV LAB;  Service: Cardiovascular;  Laterality: Right;  . TRANSMETATARSAL AMPUTATION Bilateral 10/13/2018   Procedure: 1.  Amputation right great toe MTPJ 2.  Amputation right second toe MTPJ 3.  Amputation right fourth toe MTPJ 4.  Amputation right fifth toe MTPJ 5.  Excision distal second metatarsal head right foot 6.  Excision distal third metatarsal head right foot 7.  Amputation left third toe 8.  Incision and drainage infectionleft 2nd toe amputation site. ;  Surgeon: Samara Deist, DPM;  Location  Based on our high risk symptom management report; this patient has a high risk of ED utilization.  The percentage below indicates how "at risk "  this patient based on the factors in this table within one year.   General Risk Score: 11  Values used to calculate this score:   Points   Metrics      1        Age: [facility] Admissions: 4      3        ED Visits: 4      1        Has Chronic Obstructive Pulmonary Disease: Yes      1        Has Diabetes: Yes      1        Has Congestive Heart Failure: Yes      0        Has liver disease: No      1        Has Depression: Yes      0        Current PCP: [name], MD      0        Has Medicaid: No   3. We discussed that social determinants of health may have significant impacts on health and outcomes for cancer patients.  Today we discussed specific social determinants of performance status, alcohol use, depression, financial needs, food insecurity, housing, interpersonal violence, social connections, stress, tobacco use, and transportation.    After lengthy discussion the following were identified as areas of need: None at this time.   Outpatient services: We discussed options including home based and outpatient services, DME and care program. We discusssed that patients who participate in regular physical activity report fewer negative impacts of cancer and treatments and report less fatigue.   Financial Concerns: We discussed that living with cancer can create tremendous financial burden.  We discussed options for assistance. I asked that if assistance is needed in affording medications or paying bills to please let us know so that we can provide assistance. We discussed options for food including social services, [employer] ($50 every 2 weeks) and onsite food pantry.  We will also notify [facility]ab[facility] Lister [facility] to see if cancer center can provide additional support.  Referral to Social work: Introduced Education officer, museum Elease Etienne and the services he can provide such as support with [employer], cell phone and gas vouchers.   Support groups: We discussed options for support groups at the cancer center. If interested, please notify nurse navigator to enroll. We discussed options for managing stress including  healthy eating, exercise as well as participating in no charge counseling services at the cancer center and support groups.  If these are of interest, patient can notify either myself or primary nursing team.We discussed options for management including medications and referral to quit Smart program  Transportation: We discussed options for transportation including acta, paratransit, bus routes, link transit, taxi/uber/lyft, and cancer center [place].  I have notified primary oncology team who will help assist with arranging Lucianne Lei transportation for appointments when/if needed. We also discussed options for transportation on short notice/acute visits.  Palliative care services: We have palliative care services available in the cancer center to discuss goals of care and advanced care planning.  Please let us know if you have any questions or would like to speak to  our palliative nurse practitioner.  Symptom Management Clinic: We discussed our symptom management clinic which is available for acute concerns while receiving treatment such as nausea, vomiting or diarrhea.  We can be reached via telephone at [phone] or through my chart.  We are available for virtual or in person visits on the same day from 830 to 4 PM Monday through Friday. He denies needing specific assistance at this time and He will be followed by Dr. Collie Siad  clinical team.  Plan: Discussed symptom management clinic. Discussed palliative care services. Discussed resources that are available here at the cancer center. Discussed medications and new prescriptions to begin treatment such as anti-nausea or steroids.   Disposition: RTC on 01/04/2020 for labs and cycle 1 keytruda. Plan is to reschedule PET and MRI of his brain. Referral to palliative care for stage IV disease.  Visit Diagnosis 1. Malignant neoplasm of upper lobe of left lung [facility])     Patient expressed understanding and was in agreement with this plan. He also understands that He  can call clinic at any time with any questions, concerns, or complaints.   Greater than 50% was spent in counseling and coordination of care with this patient including but not limited to discussion of the relevant topics above (See A&P) including, but not limited to diagnosis and management of acute and chronic medical conditions.   [place] [facility] [place]  CC: Dr. Tasia Catchings

## 2020-01-03 ENCOUNTER — Other Ambulatory Visit: Payer: Medicare Other

## 2020-01-04 ENCOUNTER — Inpatient Hospital Stay: Payer: Medicare Other

## 2020-01-04 ENCOUNTER — Other Ambulatory Visit: Payer: Self-pay

## 2020-01-04 ENCOUNTER — Inpatient Hospital Stay (HOSPITAL_BASED_OUTPATIENT_CLINIC_OR_DEPARTMENT_OTHER): Payer: Medicare Other | Admitting: Oncology

## 2020-01-04 ENCOUNTER — Encounter: Payer: Self-pay | Admitting: Oncology

## 2020-01-04 ENCOUNTER — Encounter: Payer: Self-pay | Admitting: *Deleted

## 2020-01-04 ENCOUNTER — Encounter: Admission: RE | Admit: 2020-01-04 | Payer: Medicare Other | Source: Ambulatory Visit

## 2020-01-04 VITALS — BP 135/61 | HR 85 | Temp 97.8°F | Wt 136.9 lb

## 2020-01-04 DIAGNOSIS — N186 End stage renal disease: Secondary | ICD-10-CM | POA: Diagnosis not present

## 2020-01-04 DIAGNOSIS — Z5112 Encounter for antineoplastic immunotherapy: Secondary | ICD-10-CM

## 2020-01-04 DIAGNOSIS — D649 Anemia, unspecified: Secondary | ICD-10-CM

## 2020-01-04 DIAGNOSIS — C3492 Malignant neoplasm of unspecified part of left bronchus or lung: Secondary | ICD-10-CM | POA: Diagnosis not present

## 2020-01-04 DIAGNOSIS — J910 Malignant pleural effusion: Secondary | ICD-10-CM | POA: Diagnosis not present

## 2020-01-04 LAB — COMPREHENSIVE METABOLIC PANEL
ALT: 7 U/L (ref 0–44)
AST: 16 U/L (ref 15–41)
Albumin: 3 g/dL — ABNORMAL LOW (ref 3.5–5.0)
Alkaline Phosphatase: 45 U/L (ref 38–126)
Anion gap: 14 (ref 5–15)
BUN: 34 mg/dL — ABNORMAL HIGH (ref 8–23)
CO2: 28 mmol/L (ref 22–32)
Calcium: 7.5 mg/dL — ABNORMAL LOW (ref 8.9–10.3)
Chloride: 91 mmol/L — ABNORMAL LOW (ref 98–111)
Creatinine, Ser: 5.14 mg/dL — ABNORMAL HIGH (ref 0.61–1.24)
GFR calc Af Amer: 12 mL/min — ABNORMAL LOW (ref >60)
GFR calc non Af Amer: 10 mL/min — ABNORMAL LOW (ref >60)
Glucose, Bld: 194 mg/dL — ABNORMAL HIGH (ref 70–99)
Potassium: 3 mmol/L — ABNORMAL LOW (ref 3.5–5.1)
Sodium: 133 mmol/L — ABNORMAL LOW (ref 135–145)
Total Bilirubin: 0.5 mg/dL (ref 0.3–1.2)
Total Protein: 7 g/dL (ref 6.5–8.1)

## 2020-01-04 LAB — CBC WITH DIFFERENTIAL/PLATELET
Abs Immature Granulocytes: 0.13 10*3/uL — ABNORMAL HIGH (ref 0.00–0.07)
Basophils Absolute: 0.1 10*3/uL (ref 0.0–0.1)
Basophils Relative: 1 %
Eosinophils Absolute: 0.1 10*3/uL (ref 0.0–0.5)
Eosinophils Relative: 1 %
HCT: 30.9 % — ABNORMAL LOW (ref 39.0–52.0)
Hemoglobin: 10.3 g/dL — ABNORMAL LOW (ref 13.0–17.0)
Immature Granulocytes: 1 %
Lymphocytes Relative: 11 %
Lymphs Abs: 1.3 10*3/uL (ref 0.7–4.0)
MCH: 32.3 pg (ref 26.0–34.0)
MCHC: 33.3 g/dL (ref 30.0–36.0)
MCV: 96.9 fL (ref 80.0–100.0)
Monocytes Absolute: 1 10*3/uL (ref 0.1–1.0)
Monocytes Relative: 9 %
Neutro Abs: 8.6 10*3/uL — ABNORMAL HIGH (ref 1.7–7.7)
Neutrophils Relative %: 77 %
Platelets: 208 10*3/uL (ref 150–400)
RBC: 3.19 MIL/uL — ABNORMAL LOW (ref 4.22–5.81)
RDW: 13.7 % (ref 11.5–15.5)
WBC: 11.1 10*3/uL — ABNORMAL HIGH (ref 4.0–10.5)
nRBC: 0 % (ref 0.0–0.2)

## 2020-01-04 LAB — TSH: TSH: 4.252 u[IU]/mL (ref 0.350–4.500)

## 2020-01-04 MED ORDER — SODIUM CHLORIDE 0.9 % IV SOLN
Freq: Once | INTRAVENOUS | Status: AC
  Filled 2020-01-04: qty 250

## 2020-01-04 MED ORDER — SODIUM CHLORIDE 0.9 % IV SOLN
200.0000 mg | Freq: Once | INTRAVENOUS | Status: AC
  Administered 2020-01-04: 200 mg via INTRAVENOUS
  Filled 2020-01-04: qty 8

## 2020-01-04 NOTE — Progress Notes (Signed)
Hematology/Oncology Follow Up Note [facility]  Telephone:([phone] [phone]  Patient Care Team: [name], MD as PCP - General (Family Medicine) Telford Nab, RN as Oncology Nurse Navigator   Name of the patient: Jose [name]  [id number]  [birth year]   REASON FOR VISIT  follow-up for lung cancer   PERTINENT ONCOLOGY HISTORY 74 y.o. with past medical history hemodialysis ESRD, history of prostate cancer, stage IV lung cancer presents for follow-up # Patient has left pleural catheter placed, per patient he has home care nurse checking in and draining the pleural catheter.  He denies any shortness of breath at resting or with exertion. He lives at home with sister.  His nephew sometimes helps him with transportation.  Identified biggest barrier for him for follow-up visits/treatment visits is transportation.  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 Chronic findings include remote infarcts, moderate cerebral atrophy, chronic ischemic changes, Nonocclusive left V4 segment thrombus with slow flow. Moderate narrowing of the bilateral supraclinoid ICAs  INTERVAL HISTORY Jose [name] is a 74 y.o. male who has above history reviewed by me today presents for follow up visit for management of stage IV lung cancer Problems and complaints are listed below: Patient has had chemotherapy class.  He presents for evaluation prior to cycle 1 immunotherapy.  He reports no new complaints.  Denies any shortness of breath, chest pain, cough, fever or chills. Chronic fatigue is at baseline. Review of Systems  Constitutional: Positive for fatigue. Negative for appetite change, chills, fever and unexpected weight change.  HENT:   Negative for hearing loss and voice change.   Eyes: Negative for eye problems and icterus.    Respiratory: Negative for chest tightness, cough and shortness of breath.   Cardiovascular: Negative for chest pain and leg swelling.  Gastrointestinal: Negative for abdominal distention and abdominal pain.  Endocrine: Negative for hot flashes.  Genitourinary: Negative for difficulty urinating, dysuria and frequency.   Musculoskeletal: Negative for arthralgias.  Skin: Negative for itching and rash.  Neurological: Negative for light-headedness and numbness.  Hematological: Negative for adenopathy. Does not bruise/bleed easily.  Psychiatric/Behavioral: Negative for confusion.      Allergies  Allergen Reactions  . Penicillin G Other (See Comments)    Other reaction(s): Unknown DID THE REACTION INVOLVE: Swelling of the face/tongue/throat, SOB, or low BP? Unknown Sudden or severe rash/hives, skin peeling, or the inside of the mouth or nose? Unknown Did it require medical treatment? Unknown When did it last happen?unknown If all above answers are "NO", may proceed with cephalosporin use.      Past Medical History:  Diagnosis Date  . Chronic kidney disease   . Depression   . Diabetes mellitus without complication ([place])   . Glaucoma   . Glaucoma   . Gout   . Heart murmur   . HLD (hyperlipidemia)   . Hypertension   . Prostate cancer ([place])   . Renal failure    dialysis t/t/s  . Stroke ([place])    tia x 2     Past Surgical History:  Procedure Laterality Date  . AMPUTATION TOE Bilateral 07/23/2018   Procedure: TOE MPJ RIGHT 3RD AND LEFT 2ND;  Surgeon: Samara Deist, DPM;  Location: [facility];  Service: Podiatry;  Laterality: Bilateral;  . APPENDECTOMY    . AV FISTULA PLACEMENT Left 06/09/2018   Procedure: ARTERIOVENOUS (  AV) FISTULA CREATION ( BRACHIAL CEPHALIC);  Surgeon: Algernon Huxley, MD;  Location: [facility];  Service: Vascular;  Laterality: Left;  . CHOLECYSTECTOMY    . DIALYSIS/PERMA CATHETER INSERTION N/A 03/24/2018   Procedure: DIALYSIS/PERMA CATHETER INSERTION;  Surgeon:  Katha Cabal, MD;  Location: [place] [facility];  Service: Cardiovascular;  Laterality: N/A;  . DIALYSIS/PERMA CATHETER REMOVAL N/A 09/06/2018   Procedure: DIALYSIS/PERMA CATHETER REMOVAL;  Surgeon: Algernon Huxley, MD;  Location: [place] [facility];  Service: Cardiovascular;  Laterality: N/A;  . INSERTION PROSTATE RADIATION SEED    . IR PERC PLEURAL DRAIN W/INDWELL CATH W/IMG GUIDE  11/03/2019  . IRRIGATION AND DEBRIDEMENT FOOT Right 12/17/2018   Procedure: IRRIGATION AND DEBRIDEMENT FOOT;  Surgeon: Samara Deist, DPM;  Location: [facility];  Service: Podiatry;  Laterality: Right;  . LOWER EXTREMITY ANGIOGRAPHY Right 06/16/2018   Procedure: LOWER EXTREMITY ANGIOGRAPHY;  Surgeon: Algernon Huxley, MD;  Location: [place] [facility];  Service: Cardiovascular;  Laterality: Right;  . LOWER EXTREMITY ANGIOGRAPHY Left 08/30/2018   Procedure: LOWER EXTREMITY ANGIOGRAPHY;  Surgeon: Algernon Huxley, MD;  Location: [place] [facility];  Service: Cardiovascular;  Laterality: Left;  . LOWER EXTREMITY ANGIOGRAPHY Right 09/30/2018   Procedure: LOWER EXTREMITY ANGIOGRAPHY;  Surgeon: Algernon Huxley, MD;  Location: [place] [facility];  Service: Cardiovascular;  Laterality: Right;  . LOWER EXTREMITY ANGIOGRAPHY Left 10/07/2018   Procedure: LOWER EXTREMITY ANGIOGRAPHY;  Surgeon: Algernon Huxley, MD;  Location: [place] [facility];  Service: Cardiovascular;  Laterality: Left;  . LOWER EXTREMITY ANGIOGRAPHY Right 12/20/2018   Procedure: Lower Extremity Angiography;  Surgeon: Algernon Huxley, MD;  Location: [place] [facility];  Service: Cardiovascular;  Laterality: Right;  . TRANSMETATARSAL AMPUTATION Bilateral 10/13/2018   Procedure: 1.  Amputation right great toe MTPJ 2.  Amputation right second toe MTPJ 3.  Amputation right fourth toe MTPJ 4.  Amputation right fifth toe MTPJ 5.  Excision distal second metatarsal head right foot 6.  Excision distal third metatarsal head right foot 7.  Amputation left third toe 8.  Incision and  drainage infectionleft 2nd toe amputation site. ;  Surgeon: Samara Deist, DPM;  Location    Social History   Socioeconomic History  . Marital status: Widowed    Spouse name: Not on file  . Number of children: Not on file  . Years of education: Not on file  . Highest education level: Not on file  Occupational History    Employer: RETIRED  Tobacco Use  . Smoking status: Current Some Day Smoker    Packs/day: 0.10    Types: Cigarettes  . Smokeless tobacco: Never Used  . Tobacco comment: pt currently smokes about 3 cigarettes a day   Vaping Use  . Vaping Use: Never used  Substance and Sexual Activity  . Alcohol use: Not Currently    Alcohol/week: 0.0 standard drinks  . Drug use: Never  . Sexual activity: Not Currently  Other Topics Concern  . Not on file  Social History Narrative  . Not on file   Social Determinants of Health   Financial Resource Strain:   . Difficulty of Paying Living Expenses:   Food Insecurity:   . Worried About Charity fundraiser in the Last Year:   . Arboriculturist in the Last Year:   Transportation Needs:   . Film/video editor (Medical):   Marland Kitchen Lack of Transportation (Non-Medical):   Physical Activity:   . Days of Exercise  per Week:   . Minutes of Exercise per Session:   Stress:   . Feeling of Stress :   Social Connections:   . Frequency of Communication with Friends and Family:   . Frequency of Social Gatherings with Friends and Family:   . Attends Religious Services:   . Active Member of Clubs or Organizations:   . Attends Archivist Meetings:   Marland Kitchen Marital Status:   Intimate Partner Violence:   . Fear of Current or Ex-Partner:   . Emotionally Abused:   Marland Kitchen Physically Abused:   . Sexually Abused:     Family History  Problem Relation Age of Onset  . Varicose Veins Neg Hx      Current Outpatient Medications:  .  aspirin 81 MG EC tablet, Take 1 tablet (81 mg total) by mouth daily., Disp: 30 tablet, Rfl: 2 .  allopurinol  (ZYLOPRIM) 100 MG tablet, Take 1 tablet (100 mg total) by mouth daily., Disp: 30 tablet, Rfl: 2 .  atorvastatin (LIPITOR) 10 MG tablet, Take 1 tablet (10 mg total) by mouth daily at 6 PM. (Patient not taking: Reported on 01/04/2020), Disp: 30 tablet, Rfl: 2 .  calcium acetate (PHOSLO) 667 MG capsule, Take 2 capsules (1,334 mg total) by mouth 3 (three) times daily with meals. (Patient not taking: Reported on 10/22/2019), Disp: 90 capsule, Rfl: 0 .  clopidogrel (PLAVIX) 75 MG tablet, Take 1 tablet (75 mg total) by mouth daily., Disp: 30 tablet, Rfl: 2 .  escitalopram (LEXAPRO) 10 MG tablet, Take 1 tablet (10 mg total) by mouth daily., Disp: 30 tablet, Rfl: 2 .  nicotine (NICODERM CQ - DOSED IN MG/24 HOURS) 21 mg/24hr patch, Place 1 patch (21 mg total) onto the skin daily. (Patient not taking: Reported on 01/04/2020), Disp: 28 patch, Rfl: 0 .  ondansetron (ZOFRAN) 4 MG tablet, Take 1 tablet (4 mg total) by mouth every 6 (six) hours as needed for nausea. (Patient not taking: Reported on 01/04/2020), Disp: 20 tablet, Rfl: 0 .  oxyCODONE (OXY IR/ROXICODONE) 5 MG immediate release tablet, Take 1 tablet (5 mg total) by mouth every 6 (six) hours as needed for severe pain. (Patient not taking: Reported on 01/04/2020), Disp: 10 tablet, Rfl: 0 .  tamsulosin (FLOMAX) 0.4 MG CAPS capsule, Take 1 capsule (0.4 mg total) by mouth daily. (Patient not taking: Reported on 01/04/2020), Disp: 30 capsule, Rfl: 2 .  traMADol (ULTRAM) 50 MG tablet, 50 mg. (Patient not taking: Reported on 01/04/2020), Disp: , Rfl:   Physical exam:  Vitals:   01/04/20 0900  BP: 135/61  Pulse: 85  Temp: 97.8 F (36.6 C)  TempSrc: Tympanic  SpO2: 97%  Weight: 136 lb 14.4 oz (62.1 kg)   Physical Exam Constitutional:      General: He is not in acute distress. HENT:     Head: Normocephalic and atraumatic.  Eyes:     General: No scleral icterus. Cardiovascular:     Rate and Rhythm: Normal rate and regular rhythm.     Heart sounds: Normal heart  sounds.  Pulmonary:     Effort: Pulmonary effort is normal. No respiratory distress.     Breath sounds: No wheezing.     Comments: Decreased breath sound bilaterally. Abdominal:     General: Bowel sounds are normal. There is no distension.     Palpations: Abdomen is soft.  Musculoskeletal:        General: No deformity. Normal range of motion.     Cervical back: Normal range  of motion and neck supple.  Skin:    General: Skin is warm and dry.     Findings: No erythema or rash.  Neurological:     Mental Status: He is alert and oriented to person, place, and time. Mental status is at baseline.     Cranial Nerves: No cranial nerve deficit.     Coordination: Coordination normal.  Psychiatric:        Mood and Affect: Mood normal.     CMP Latest Ref Rng & Units 01/04/2020  Glucose 70 - 99 mg/dL 194(H)  BUN 8 - 23 mg/dL 34(H)  Creatinine 0.61 - 1.24 mg/dL 5.14(H)  Sodium 135 - 145 mmol/L 133(L)  Potassium 3.5 - 5.1 mmol/L 3.0(L)  Chloride 98 - 111 mmol/L 91(L)  CO2 22 - 32 mmol/L 28  Calcium 8.9 - 10.3 mg/dL 7.5(L)  Total Protein 6.5 - 8.1 g/dL 7.0  Total Bilirubin 0.3 - 1.2 mg/dL 0.5  Alkaline Phos 38 - 126 U/L 45  AST 15 - 41 U/L 16  ALT 0 - 44 U/L 7   CBC Latest Ref Rng & Units 01/04/2020  WBC 4.0 - 10.5 K/uL 11.1(H)  Hemoglobin 13.0 - 17.0 g/dL 10.3(L)  Hematocrit 39 - 52 % 30.9(L)  Platelets 150 - 400 K/uL 208    RADIOGRAPHIC STUDIES: I have personally reviewed the radiological images as listed and agreed with the findings in the report. MR Brain W Wo Contrast  Result Date: 12/23/2019 CLINICAL DATA:  Lung cancer staging. EXAM: MRI HEAD WITHOUT AND WITH CONTRAST TECHNIQUE: Multiplanar, multiecho pulse sequences of the brain and surrounding structures were obtained without and with intravenous contrast. CONTRAST:  64m GADAVIST GADOBUTROL 1 MMOL/ML IV SOLN COMPARISON:  03/08/2007 MRI head. FINDINGS: Brain: No focal diffusion-weighted signal abnormality. Moderate diffuse  parenchymal volume loss with ex vacuo dilatation. Scattered and confluent T2 hyperintense foci involving the supratentorial and infratentorial white matter. Sequela of remote bilateral centrum semiovale, left corona radiata and bilateral thalamic infarcts. Also small remote bilateral cerebellar infarcts. Prominent bilateral basal ganglia perivascular spaces. No midline shift or extra-axial fluid collection. Tiny remote microhemorrhages involving the bilateral cerebrum and right thalamus. No abnormal enhancement. Vascular: Nonocclusive thrombus within the left V4 segment (18:33) with slow flow. Query moderate narrowing of the left greater than right supraclinoid ICAs. Otherwise normal flow voids tiny left occipital developmental venous anomaly. Skull and upper cervical spine: Upper cervical spondylosis. No focal enhancing osseous lesion. Sinuses/Orbits: Normal orbits. Clear paranasal sinuses. No mastoid effusion. Other: None. IMPRESSION: No evidence of intracranial metastases. Remote bilateral centrum semiovale, left corona radiata, bilateral thalamic and bilateral cerebellar infarcts. Moderate cerebral atrophy. Moderate chronic microvascular ischemic changes. Nonocclusive left V4 segment thrombus with slow flow. Moderate narrowing of the bilateral supraclinoid ICAs. Consider MRA head for further evaluation. Electronically Signed   By: CPrimitivo GauzeM.D.   On: 12/23/2019 13:33     Assessment and plan  1. Malignant pleural effusion   2. [place]   3. Encounter for antineoplastic immunotherapy   4. ESRD (end stage renal disease) ([place]   5. 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 Proceed with cycle 1 Keytruda Obtain PET scan for new baseline as it has been more than 2 months since his last CT scan. Patient to establish care with palliative care service.  #Malignant pleural  effusion, patient has pleural catheter placed, drained by home health care nurse.  #ESRD  on hemodialysis, potassium is 3, creatinine 5.14.  Hold off potassium supplementation due to ESRD. #Anemia, hemoglobin 10.3, likely due to chronic kidney disease.  Check iron panel.  Check B12 and folate at the next visit. Follow-up in 10 days for evaluation of treatment tolerability.  Follow-up in 3 weeks for evaluation prior to next visit.  Earlie Server, MD, PhD Hematology Oncology [facility] at [facility] Pager- [phone] 01/04/2020

## 2020-01-04 NOTE — Addendum Note (Signed)
Addended by: Faythe Casa E on: 01/04/2020 11:17 AM   Modules accepted: Orders

## 2020-01-04 NOTE — Progress Notes (Signed)
Patient states he is only taking 1 medication for his blood pressure.  I asked him to bring his medications with him to his next visit.

## 2020-01-04 NOTE — Progress Notes (Signed)
  Oncology Nurse Navigator Documentation  Navigator Location: [facility] (01/04/20 1300)   )Navigator Encounter Type: Follow-up Appt;Treatment (01/04/20 1300)                   Treatment Initiated Date: 01/04/20 (01/04/20 1300) Patient Visit Type: MedOnc (01/04/20 1300) Treatment Phase: First Chemo Tx (01/04/20 1300) Barriers/Navigation Needs: Coordination of Care (01/04/20 1300)   Interventions: Coordination of Care (01/04/20 1300)   Coordination of Care: Appts (01/04/20 1300)     met with patient during follow up visit with Dr. Tasia Catchings to start immunotherapy treatments. All questions answered during visit. Reviewed upcoming appts. Instructed to call with any further questions or needs. Pt verbalized understanding.              Time Spent with Patient: 30 (01/04/20 1300)

## 2020-01-05 ENCOUNTER — Telehealth: Payer: Self-pay

## 2020-01-05 LAB — T4: T4, Total: 5.8 ug/dL (ref 4.5–12.0)

## 2020-01-05 NOTE — Telephone Encounter (Signed)
T/C to contact number on chart and patients nephew answered the phone stating this number is his.    He gave me the number [phone] and told me to call the patient.   I called but the gentleman answering the phone said it was a wrong number.   I had told the nephew I was calling to see how he did after receiving his infusion yesterday.  The nephew states he did well.   Encouraged nephew to call for any questions or concerns.

## 2020-01-09 ENCOUNTER — Telehealth: Payer: Self-pay | Admitting: *Deleted

## 2020-01-09 NOTE — Telephone Encounter (Signed)
Call returned to [facility] doctor RESPONSE, SHE WILL CONTINUE WITH VISITS AND REPORT BACK IN A FEW WEEKS

## 2020-01-09 NOTE — Telephone Encounter (Signed)
Orangetree with [facility] called reporting that she has been draining patient Pleur ex catheter weekly and the past few weeks she is only getting drops when drained. She is asking if the catheter needs to be removed or what to do. Please advise

## 2020-01-09 NOTE — Telephone Encounter (Signed)
Pt's sister made aware of PET scan scheduled for Wed 8/11 at 9:30am. Informed that Lucianne Lei will pick him up at 8:15am to bring him to his appt. Instructed that pt needs to remain NPO 6 hours before scan Wednesday morning. [name] verbalized understanding. Nothing further needed at this time.

## 2020-01-09 NOTE — Telephone Encounter (Signed)
He just started on treatment and I will wait another 2 weeks, if not much drainage, I will order to discontinue that.

## 2020-01-11 ENCOUNTER — Other Ambulatory Visit: Payer: Self-pay

## 2020-01-11 ENCOUNTER — Telehealth: Payer: Self-pay | Admitting: *Deleted

## 2020-01-11 ENCOUNTER — Inpatient Hospital Stay: Payer: Medicare Other

## 2020-01-11 ENCOUNTER — Ambulatory Visit
Admission: RE | Admit: 2020-01-11 | Discharge: 2020-01-11 | Disposition: A | Discharge status: Home / Self Care | Payer: Medicare Other | Source: Ambulatory Visit | Attending: Oncology | Admitting: Oncology

## 2020-01-11 DIAGNOSIS — C3412 Malignant neoplasm of upper lobe, left bronchus or lung: Secondary | ICD-10-CM

## 2020-01-11 NOTE — Telephone Encounter (Signed)
Spoke with pt's sister, [name], to remind of appt scheduled for Friday 8/13 as well as rescheduled appt for PET scan on 8/18. Emphasized that pt needs to remain NPO 6 hours before PET scan on 8/18. Instructed to call back with any further questions or needs. [name] verbalized understanding.

## 2020-01-13 ENCOUNTER — Encounter: Payer: Self-pay | Admitting: Oncology

## 2020-01-13 ENCOUNTER — Inpatient Hospital Stay (HOSPITAL_BASED_OUTPATIENT_CLINIC_OR_DEPARTMENT_OTHER): Payer: Medicare Other | Admitting: Oncology

## 2020-01-13 ENCOUNTER — Inpatient Hospital Stay (HOSPITAL_BASED_OUTPATIENT_CLINIC_OR_DEPARTMENT_OTHER): Payer: Medicare Other | Admitting: Hospice and Palliative Medicine

## 2020-01-13 ENCOUNTER — Other Ambulatory Visit: Payer: Self-pay

## 2020-01-13 ENCOUNTER — Inpatient Hospital Stay: Payer: Medicare Other

## 2020-01-13 ENCOUNTER — Encounter: Payer: Self-pay | Admitting: *Deleted

## 2020-01-13 VITALS — BP 144/79 | HR 74 | Temp 98.0°F | Resp 16 | Wt 139.7 lb

## 2020-01-13 DIAGNOSIS — C3492 Malignant neoplasm of unspecified part of left bronchus or lung: Secondary | ICD-10-CM | POA: Diagnosis not present

## 2020-01-13 DIAGNOSIS — N186 End stage renal disease: Secondary | ICD-10-CM | POA: Diagnosis not present

## 2020-01-13 DIAGNOSIS — D649 Anemia, unspecified: Secondary | ICD-10-CM

## 2020-01-13 DIAGNOSIS — Z515 Encounter for palliative care: Secondary | ICD-10-CM

## 2020-01-13 DIAGNOSIS — Z5112 Encounter for antineoplastic immunotherapy: Secondary | ICD-10-CM | POA: Diagnosis not present

## 2020-01-13 DIAGNOSIS — J910 Malignant pleural effusion: Secondary | ICD-10-CM | POA: Diagnosis not present

## 2020-01-13 LAB — FERRITIN: Ferritin: 578 ng/mL — ABNORMAL HIGH (ref 24–336)

## 2020-01-13 LAB — IRON AND TIBC
Iron: 45 ug/dL (ref 45–182)
Saturation Ratios: 21 % (ref 17.9–39.5)
TIBC: 210 ug/dL — ABNORMAL LOW (ref 250–450)
UIBC: 165 ug/dL

## 2020-01-13 LAB — VITAMIN B12: Vitamin B-12: 385 pg/mL (ref 180–914)

## 2020-01-13 LAB — FOLATE: Folate: 7.8 ng/mL (ref >5.9)

## 2020-01-13 NOTE — Progress Notes (Signed)
Patient is unsure of what medications he is taking but says he takes 6 pills a day.  I advised him to bring the medication with him to next visit for verification.  He is feeling weak and has episodes of diarrhea.

## 2020-01-13 NOTE — Progress Notes (Signed)
[place]  [phone] [phone]   Name: Jose [name] Date: 01/13/2020 MRN: [id number]  DOB: 1945-08-23  Patient Care Team: [name], MD as PCP - General (Family Medicine) Telford Nab, RN as Oncology Nurse Navigator    REASON FOR CONSULTATION: Jose [name] is a 74 y.o. male with multiple medical problems including stage IV lung cancer on immunotherapy, malignant pleural effusion status post Pleurx, and ESRD on hemodialysis.  Palliative care was consulted up address goals and manage ongoing symptoms.  SOCIAL HISTORY:     reports that he has been smoking cigarettes. He has been smoking about 0.10 packs per day. He has never used smokeless tobacco. He reports previous alcohol use. He reports that he does not use drugs.  Patient is a widower.  He lives with his sister.  Patient has a son who lives nearby.  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  ADVANCE DIRECTIVES:  Not on file  CODE STATUS:   PAST MEDICAL HISTORY: Past Medical History:  Diagnosis Date  . Chronic kidney disease   . Depression   . Diabetes mellitus without complication ([place])   . Glaucoma   . Glaucoma   . Gout   . Heart murmur   . HLD (hyperlipidemia)   . Hypertension   . Prostate cancer ([place])   . Renal failure    dialysis t/t/s  . Stroke ([place])    tia x 2    PAST SURGICAL HISTORY:  Past Surgical History:  Procedure Laterality Date  . AMPUTATION TOE Bilateral 07/23/2018   Procedure: TOE MPJ RIGHT 3RD AND LEFT 2ND;  Surgeon: Samara Deist, DPM;  Location: [facility];  Service: Podiatry;  Laterality: Bilateral;  . APPENDECTOMY    . AV FISTULA PLACEMENT Left 06/09/2018   Procedure: ARTERIOVENOUS (AV) FISTULA CREATION ( BRACHIAL CEPHALIC);  Surgeon: Algernon Huxley, MD;  Location: [facility];  Service: Vascular;  Laterality: Left;  . CHOLECYSTECTOMY    . DIALYSIS/PERMA CATHETER INSERTION N/A 03/24/2018     Procedure: DIALYSIS/PERMA CATHETER INSERTION;  Surgeon: Katha Cabal, MD;  Location: [place] [facility];  Service: Cardiovascular;  Laterality: N/A;  . DIALYSIS/PERMA CATHETER REMOVAL N/A 09/06/2018   Procedure: DIALYSIS/PERMA CATHETER REMOVAL;  Surgeon: Algernon Huxley, MD;  Location: [place] [facility];  Service: Cardiovascular;  Laterality: N/A;  . INSERTION PROSTATE RADIATION SEED    . IR PERC PLEURAL DRAIN W/INDWELL CATH W/IMG GUIDE  11/03/2019  . IRRIGATION AND DEBRIDEMENT FOOT Right 12/17/2018   Procedure: IRRIGATION AND DEBRIDEMENT FOOT;  Surgeon: Samara Deist, DPM;  Location: [facility];  Service: Podiatry;  Laterality: Right;  . LOWER EXTREMITY ANGIOGRAPHY Right 06/16/2018   Procedure: LOWER EXTREMITY ANGIOGRAPHY;  Surgeon: Algernon Huxley, MD;  Location: [place] [facility];  Service: Cardiovascular;  Laterality: Right;  . LOWER EXTREMITY ANGIOGRAPHY Left 08/30/2018   Procedure: LOWER EXTREMITY ANGIOGRAPHY;  Surgeon: Algernon Huxley, MD;  Location: [place] [facility];  Service: Cardiovascular;  Laterality: Left;  . LOWER EXTREMITY ANGIOGRAPHY Right 09/30/2018   Procedure: LOWER EXTREMITY ANGIOGRAPHY;  Surgeon: Algernon Huxley, MD;  Location: [place] [facility];  Service: Cardiovascular;  Laterality: Right;  . LOWER EXTREMITY ANGIOGRAPHY Left 10/07/2018   Procedure: LOWER EXTREMITY ANGIOGRAPHY;  Surgeon: Algernon Huxley, MD;  Location: [place] [facility];  Service: Cardiovascular;  Laterality: Left;  . LOWER EXTREMITY ANGIOGRAPHY Right 12/20/2018   Procedure: Lower Extremity Angiography;  Surgeon: Algernon Huxley,  MD;  Location: [place] [facility];  Service: Cardiovascular;  Laterality: Right;  . TRANSMETATARSAL AMPUTATION Bilateral 10/13/2018   Procedure: 1.  Amputation right great toe MTPJ 2.  Amputation right second toe MTPJ 3.  Amputation right fourth toe MTPJ 4.  Amputation right fifth toe MTPJ 5.  Excision distal second metatarsal head right foot 6.  Excision distal third metatarsal head  right foot 7.  Amputation left third toe 8.  Incision and drainage infectionleft 2nd toe amputation site. ;  Surgeon: Samara Deist, DPM;  Location    HEMATOLOGY/ONCOLOGY HISTORY:  Oncology History  Primary adenocarcinoma of left lung ([place])  11/04/2019 Initial Diagnosis   Primary adenocarcinoma of left lung ([place])   12/23/2019 Cancer Staging   Staging form: Lung, AJCC 8th Edition - Clinical: Stage IVA (cT2, cN2, pM1a) - Signed by Earlie Server, MD on 12/23/2019   01/04/2020 -  Chemotherapy   The patient had pembrolizumab (KEYTRUDA) 200 mg in sodium chloride 0.9 % 50 mL chemo infusion, 200 mg, Intravenous, Once, 1 of 6 cycles Administration: 200 mg (01/04/2020)  for chemotherapy treatment.      ALLERGIES:  is allergic to penicillin g.  MEDICATIONS:  Current Outpatient Medications  Medication Sig Dispense Refill  . allopurinol (ZYLOPRIM) 100 MG tablet Take 1 tablet (100 mg total) by mouth daily. 30 tablet 2  . aspirin 81 MG EC tablet Take 1 tablet (81 mg total) by mouth daily. 30 tablet 2  . atorvastatin (LIPITOR) 10 MG tablet Take 1 tablet (10 mg total) by mouth daily at 6 PM. (Patient not taking: Reported on 01/04/2020) 30 tablet 2  . calcium acetate (PHOSLO) 667 MG capsule Take 2 capsules (1,334 mg total) by mouth 3 (three) times daily with meals. (Patient not taking: Reported on 10/22/2019) 90 capsule 0  . clopidogrel (PLAVIX) 75 MG tablet Take 1 tablet (75 mg total) by mouth daily. 30 tablet 2  . escitalopram (LEXAPRO) 10 MG tablet Take 1 tablet (10 mg total) by mouth daily. 30 tablet 2  . nicotine (NICODERM CQ - DOSED IN MG/24 HOURS) 21 mg/24hr patch Place 1 patch (21 mg total) onto the skin daily. (Patient not taking: Reported on 01/04/2020) 28 patch 0  . ondansetron (ZOFRAN) 4 MG tablet Take 1 tablet (4 mg total) by mouth every 6 (six) hours as needed for nausea. (Patient not taking: Reported on 01/04/2020) 20 tablet 0  . oxyCODONE (OXY IR/ROXICODONE) 5 MG immediate release tablet Take 1 tablet (5  mg total) by mouth every 6 (six) hours as needed for severe pain. (Patient not taking: Reported on 01/04/2020) 10 tablet 0  . tamsulosin (FLOMAX) 0.4 MG CAPS capsule Take 1 capsule (0.4 mg total) by mouth daily. (Patient not taking: Reported on 01/04/2020) 30 capsule 2  . traMADol (ULTRAM) 50 MG tablet 50 mg. (Patient not taking: Reported on 01/04/2020)     No current facility-administered medications for this visit.    VITAL SIGNS: There were no vitals taken for this visit. There were no vitals filed for this visit.  Estimated body mass index is 18.06 kg/m as calculated from the following:   Height as of 12/23/19: 6\' 1"  (1.854 m).   Weight as of 01/04/20: 136 lb 14.4 oz (62.1 kg).  LABS: CBC:    Component Value Date/Time   WBC 11.1 (H) 01/04/2020 0838   HGB 10.3 (L) 01/04/2020 0838   HGB 13.6 08/18/2011 1054   HCT 30.9 (L) 01/04/2020 0838   PLT 208 01/04/2020 0838   MCV  96.9 01/04/2020 0838   NEUTROABS 8.6 (H) 01/04/2020 0838   LYMPHSABS 1.3 01/04/2020 0838   MONOABS 1.0 01/04/2020 0838   EOSABS 0.1 01/04/2020 0838   BASOSABS 0.1 01/04/2020 0838   Comprehensive Metabolic Panel:    Component Value Date/Time   NA 133 (L) 01/04/2020 0838   NA 141 08/18/2011 1054   K 3.0 (L) 01/04/2020 0838   K 4.5 08/18/2011 1054   CL 91 (L) 01/04/2020 0838   CL 108 (H) 08/18/2011 1054   CO2 28 01/04/2020 0838   CO2 23 08/18/2011 1054   BUN 34 (H) 01/04/2020 0838   BUN 25 (H) 08/18/2011 1054   CREATININE 5.14 (H) 01/04/2020 0838   CREATININE 2.10 (H) 08/18/2011 1054   GLUCOSE 194 (H) 01/04/2020 0838   GLUCOSE 119 (H) 08/18/2011 1054   CALCIUM 7.5 (L) 01/04/2020 0838   CALCIUM 9.0 08/18/2011 1054   AST 16 01/04/2020 0838   ALT 7 01/04/2020 0838   ALKPHOS 45 01/04/2020 0838   BILITOT 0.5 01/04/2020 0838   PROT 7.0 01/04/2020 0838   ALBUMIN 3.0 (L) 01/04/2020 0838    RADIOGRAPHIC STUDIES: MR Brain W Wo Contrast  Result Date: 12/23/2019 CLINICAL DATA:  Lung cancer staging. EXAM: MRI HEAD  WITHOUT AND WITH CONTRAST TECHNIQUE: Multiplanar, multiecho pulse sequences of the brain and surrounding structures were obtained without and with intravenous contrast. CONTRAST:  75mL GADAVIST GADOBUTROL 1 MMOL/ML IV SOLN COMPARISON:  03/08/2007 MRI head. FINDINGS: Brain: No focal diffusion-weighted signal abnormality. Moderate diffuse parenchymal volume loss with ex vacuo dilatation. Scattered and confluent T2 hyperintense foci involving the supratentorial and infratentorial white matter. Sequela of remote bilateral centrum semiovale, left corona radiata and bilateral thalamic infarcts. Also small remote bilateral cerebellar infarcts. Prominent bilateral basal ganglia perivascular spaces. No midline shift or extra-axial fluid collection. Tiny remote microhemorrhages involving the bilateral cerebrum and right thalamus. No abnormal enhancement. Vascular: Nonocclusive thrombus within the left V4 segment (18:33) with slow flow. Query moderate narrowing of the left greater than right supraclinoid ICAs. Otherwise normal flow voids tiny left occipital developmental venous anomaly. Skull and upper cervical spine: Upper cervical spondylosis. No focal enhancing osseous lesion. Sinuses/Orbits: Normal orbits. Clear paranasal sinuses. No mastoid effusion. Other: None. IMPRESSION: No evidence of intracranial metastases. Remote bilateral centrum semiovale, left corona radiata, bilateral thalamic and bilateral cerebellar infarcts. Moderate cerebral atrophy. Moderate chronic microvascular ischemic changes. Nonocclusive left V4 segment thrombus with slow flow. Moderate narrowing of the bilateral supraclinoid ICAs. Consider MRA head for further evaluation. Electronically Signed   By: Primitivo Gauze M.D.   On: 12/23/2019 13:33    PERFORMANCE STATUS (ECOG) : 2 - Symptomatic, <50% confined to bed  Review of Systems Unless otherwise noted, a complete review of systems is negative.  Physical Exam General: NAD Pulmonary:  Unlabored Extremities: no edema, no joint deformities Skin: no rashes Neurological: Weakness but otherwise nonfocal  IMPRESSION: Comment patient following his visit with Dr. Tasia Catchings.  Patient reports he is doing reasonably well.  He denies any significant changes or concerns today.  He also denies any symptomatic complaints today.  Patient says at baseline he lives at home with his sister.  However, she works most of the day so he is home alone.  He says he is ambulatory with use of a walker and is able to provide for his own self-care.  Patient's goals seem aligned with current treatment.  He is receiving immunotherapy and seems to be tolerating well.  Patient continues to receive hemodialysis and he feels  that is going well.  Patient will benefit from addressing ACP/MOST form when his sister is present.  Patient says that his sister is primarily involved in medical decision-making.  PLAN: -Continue current scope of treatment -Will benefit from ACP/MOST form -Referral to home-based palliative care -RTC in 1 month   Patient expressed understanding and was in agreement with this plan. He also understands that He can call the clinic at any time with any questions, concerns, or complaints.     Time Total: 15 minutes  Visit consisted of counseling and education dealing with the complex and emotionally intense issues of symptom management and palliative care in the setting of serious and potentially life-threatening illness.Greater than 50%  of this time was spent counseling and coordinating care related to the above assessment and plan.  Signed by: [name], PhD, NP-C

## 2020-01-13 NOTE — Progress Notes (Signed)
  Oncology Nurse Navigator Documentation  Navigator Location: [facility]Med Onc (01/13/20 1400)   )Navigator Encounter Type: Follow-up Appt (01/13/20 1400)                     Patient Visit Type: MedOnc (01/13/20 1400)   Barriers/Navigation Needs: No Barriers At This Time;No Needs (01/13/20 1400)   Interventions: None Required (01/13/20 1400)             met with patient during follow up visit with Dr. Tasia Catchings 1 week after receiving first [name] treatment. Pt did not have any questions or needs during visit. Informed that he will be given print out of appts prior to leaving so he knows when the [facility] will pick him up for his next scheduled appt. Pt verbalized understanding. Nothing further needed.          Time Spent with Patient: 30 (01/13/20 1400)

## 2020-01-13 NOTE — Progress Notes (Signed)
Hematology/Oncology Follow Up Note [facility]  [phone] [phone]  Patient Care Team: [name], MD as PCP - General (Family Medicine) Telford Nab, RN as Oncology Nurse Navigator   Name of the patient: Jose [name]  [id number]  Nov 23, 1945   REASON FOR VISIT  follow-up for lung cancer   PERTINENT ONCOLOGY HISTORY 74 y.o. with past medical history hemodialysis ESRD, history of prostate cancer, stage IV lung cancer presents for follow-up # Patient has left pleural catheter placed, per patient he has home care nurse checking in and draining the pleural catheter.  He denies any shortness of breath at resting or with exertion. He lives at home with sister.  His nephew sometimes helps him with transportation.  Identified biggest barrier for him for follow-up visits/treatment visits is transportation.  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 Chronic findings include remote infarcts, moderate cerebral atrophy, chronic ischemic changes, Nonocclusive left V4 segment thrombus with slow flow. Moderate narrowing of the bilateral supraclinoid ICAs  INTERVAL HISTORY Jose [name] is a 74 y.o. male who has above history reviewed by me today presents for follow up visit for management of stage IV lung cancer Problems and complaints are listed below: Status post 1 cycle of Keytruda.  Patient reports feeling at baseline today.  One episode of loose stool after the treatment.  Self resolved.  Last bowel movement was this morning and was formed. Home health nurse has reported that patient has not had much of output from his left side pleural catheter. Patient denies any shortness of breath, chest pain, fever or chills.   Review of Systems  Constitutional: Positive for fatigue. Negative for appetite change, chills,  fever and unexpected weight change.  HENT:   Negative for hearing loss and voice change.   Eyes: Negative for eye problems and icterus.  Respiratory: Negative for chest tightness, cough and shortness of breath.   Cardiovascular: Negative for chest pain and leg swelling.  Gastrointestinal: Negative for abdominal distention and abdominal pain.  Endocrine: Negative for hot flashes.  Genitourinary: Negative for difficulty urinating, dysuria and frequency.   Musculoskeletal: Negative for arthralgias.  Skin: Negative for itching and rash.  Neurological: Negative for light-headedness and numbness.  Hematological: Negative for adenopathy. Does not bruise/bleed easily.  Psychiatric/Behavioral: Negative for confusion.      Allergies  Allergen Reactions  . Penicillin G Other (See Comments)    Other reaction(s): Unknown DID THE REACTION INVOLVE: Swelling of the face/tongue/throat, SOB, or low BP? Unknown Sudden or severe rash/hives, skin peeling, or the inside of the mouth or nose? Unknown Did it require medical treatment? Unknown When did it last happen?unknown If all above answers are "NO", may proceed with cephalosporin use.      Past Medical History:  Diagnosis Date  . Chronic kidney disease   . Depression   . Diabetes mellitus without complication ([place])   . Glaucoma   . Glaucoma   . Gout   . Heart murmur   . HLD (hyperlipidemia)   . Hypertension   . Prostate cancer ([place])   . Renal failure    dialysis t/t/s  . Stroke ([place])    tia x 2     Past Surgical History:  Procedure Laterality Date  . AMPUTATION TOE Bilateral 07/23/2018   Procedure: TOE MPJ RIGHT 3RD AND LEFT 2ND;  Surgeon: Vickki Muff,  Larkin Ina, DPM;  Location: [facility];  Service: Podiatry;  Laterality: Bilateral;  . APPENDECTOMY    . AV FISTULA PLACEMENT Left 06/09/2018   Procedure: ARTERIOVENOUS (AV) FISTULA CREATION ( BRACHIAL CEPHALIC);  Surgeon: Algernon Huxley, MD;  Location: [facility];  Service: Vascular;  Laterality:  Left;  . CHOLECYSTECTOMY    . DIALYSIS/PERMA CATHETER INSERTION N/A 03/24/2018   Procedure: DIALYSIS/PERMA CATHETER INSERTION;  Surgeon: Katha Cabal, MD;  Location: [place] [facility];  Service: Cardiovascular;  Laterality: N/A;  . DIALYSIS/PERMA CATHETER REMOVAL N/A 09/06/2018   Procedure: DIALYSIS/PERMA CATHETER REMOVAL;  Surgeon: Algernon Huxley, MD;  Location: [place] [facility];  Service: Cardiovascular;  Laterality: N/A;  . INSERTION PROSTATE RADIATION SEED    . IR PERC PLEURAL DRAIN W/INDWELL CATH W/IMG GUIDE  11/03/2019  . IRRIGATION AND DEBRIDEMENT FOOT Right 12/17/2018   Procedure: IRRIGATION AND DEBRIDEMENT FOOT;  Surgeon: Samara Deist, DPM;  Location: [facility];  Service: Podiatry;  Laterality: Right;  . LOWER EXTREMITY ANGIOGRAPHY Right 06/16/2018   Procedure: LOWER EXTREMITY ANGIOGRAPHY;  Surgeon: Algernon Huxley, MD;  Location: [place] [facility];  Service: Cardiovascular;  Laterality: Right;  . LOWER EXTREMITY ANGIOGRAPHY Left 08/30/2018   Procedure: LOWER EXTREMITY ANGIOGRAPHY;  Surgeon: Algernon Huxley, MD;  Location: [place] [facility];  Service: Cardiovascular;  Laterality: Left;  . LOWER EXTREMITY ANGIOGRAPHY Right 09/30/2018   Procedure: LOWER EXTREMITY ANGIOGRAPHY;  Surgeon: Algernon Huxley, MD;  Location: [place] [facility];  Service: Cardiovascular;  Laterality: Right;  . LOWER EXTREMITY ANGIOGRAPHY Left 10/07/2018   Procedure: LOWER EXTREMITY ANGIOGRAPHY;  Surgeon: Algernon Huxley, MD;  Location: [place] [facility];  Service: Cardiovascular;  Laterality: Left;  . LOWER EXTREMITY ANGIOGRAPHY Right 12/20/2018   Procedure: Lower Extremity Angiography;  Surgeon: Algernon Huxley, MD;  Location: [place] [facility];  Service: Cardiovascular;  Laterality: Right;  . TRANSMETATARSAL AMPUTATION Bilateral 10/13/2018   Procedure: 1.  Amputation right great toe MTPJ 2.  Amputation right second toe MTPJ 3.  Amputation right fourth toe MTPJ 4.  Amputation right fifth toe MTPJ 5.  Excision  distal second metatarsal head right foot 6.  Excision distal third metatarsal head right foot 7.  Amputation left third toe 8.  Incision and drainage infectionleft 2nd toe amputation site. ;  Surgeon: Samara Deist, DPM;  Location    Social History   Socioeconomic History  . Marital status: Widowed    Spouse name: Not on file  . Number of children: Not on file  . Years of education: Not on file  . Highest education level: Not on file  Occupational History    Employer: RETIRED  Tobacco Use  . Smoking status: Current Some Day Smoker    Packs/day: 0.10    Types: Cigarettes  . Smokeless tobacco: Never Used  . Tobacco comment: pt currently smokes about 3 cigarettes a day   Vaping Use  . Vaping Use: Never used  Substance and Sexual Activity  . Alcohol use: Not Currently    Alcohol/week: 0.0 standard drinks  . Drug use: Never  . Sexual activity: Not Currently  Other Topics Concern  . Not on file  Social History Narrative  . Not on file   Social Determinants of Health   Financial Resource Strain:   . Difficulty of Paying Living Expenses:   Food Insecurity:   . Worried About Charity fundraiser in the Last Year:   . [place] in the Last Year:  Transportation Needs:   . Film/video editor (Medical):   Marland Kitchen Lack of Transportation (Non-Medical):   Physical Activity:   . Days of Exercise per Week:   . Minutes of Exercise per Session:   Stress:   . Feeling of Stress :   Social Connections:   . Frequency of Communication with Friends and Family:   . Frequency of Social Gatherings with Friends and Family:   . Attends Religious Services:   . Active Member of Clubs or Organizations:   . Attends Archivist Meetings:   Marland Kitchen Marital Status:   Intimate Partner Violence:   . Fear of Current or Ex-Partner:   . Emotionally Abused:   Marland Kitchen Physically Abused:   . Sexually Abused:     Family History  Problem Relation Age of Onset  . Varicose Veins Neg Hx       Current Outpatient Medications:  .  allopurinol (ZYLOPRIM) 100 MG tablet, Take 1 tablet (100 mg total) by mouth daily., Disp: 30 tablet, Rfl: 2 .  aspirin 81 MG EC tablet, Take 1 tablet (81 mg total) by mouth daily., Disp: 30 tablet, Rfl: 2 .  clopidogrel (PLAVIX) 75 MG tablet, Take 1 tablet (75 mg total) by mouth daily., Disp: 30 tablet, Rfl: 2 .  escitalopram (LEXAPRO) 10 MG tablet, Take 1 tablet (10 mg total) by mouth daily., Disp: 30 tablet, Rfl: 2 .  atorvastatin (LIPITOR) 10 MG tablet, Take 1 tablet (10 mg total) by mouth daily at 6 PM. (Patient not taking: Reported on 01/04/2020), Disp: 30 tablet, Rfl: 2 .  calcium acetate (PHOSLO) 667 MG capsule, Take 2 capsules (1,334 mg total) by mouth 3 (three) times daily with meals. (Patient not taking: Reported on 10/22/2019), Disp: 90 capsule, Rfl: 0 .  nicotine (NICODERM CQ - DOSED IN MG/24 HOURS) 21 mg/24hr patch, Place 1 patch (21 mg total) onto the skin daily. (Patient not taking: Reported on 01/04/2020), Disp: 28 patch, Rfl: 0 .  ondansetron (ZOFRAN) 4 MG tablet, Take 1 tablet (4 mg total) by mouth every 6 (six) hours as needed for nausea. (Patient not taking: Reported on 01/04/2020), Disp: 20 tablet, Rfl: 0 .  oxyCODONE (OXY IR/ROXICODONE) 5 MG immediate release tablet, Take 1 tablet (5 mg total) by mouth every 6 (six) hours as needed for severe pain. (Patient not taking: Reported on 01/04/2020), Disp: 10 tablet, Rfl: 0 .  tamsulosin (FLOMAX) 0.4 MG CAPS capsule, Take 1 capsule (0.4 mg total) by mouth daily. (Patient not taking: Reported on 01/04/2020), Disp: 30 capsule, Rfl: 2 .  traMADol (ULTRAM) 50 MG tablet, 50 mg. (Patient not taking: Reported on 01/04/2020), Disp: , Rfl:   Physical exam:  Vitals:   01/13/20 1014  BP: (!) 144/79  Pulse: 74  Resp: 16  Temp: 98 F (36.7 C)  Weight: 139 lb 11.2 oz (63.4 kg)   Physical Exam Constitutional:      General: He is not in acute distress. HENT:     Head: Normocephalic and atraumatic.  Eyes:      General: No scleral icterus. Cardiovascular:     Rate and Rhythm: Normal rate and regular rhythm.     Heart sounds: Normal heart sounds.  Pulmonary:     Effort: Pulmonary effort is normal. No respiratory distress.     Breath sounds: No wheezing.     Comments: Decreased breath sound bilaterally. Abdominal:     General: Bowel sounds are normal. There is no distension.     Palpations: Abdomen is soft.  Musculoskeletal:        General: No deformity. Normal range of motion.     Cervical back: Normal range of motion and neck supple.  Skin:    General: Skin is warm and dry.     Findings: No erythema or rash.  Neurological:     Mental Status: He is alert and oriented to person, place, and time. Mental status is at baseline.     Cranial Nerves: No cranial nerve deficit.     Coordination: Coordination normal.  Psychiatric:        Mood and Affect: Mood normal.     CMP Latest Ref Rng & Units 01/04/2020  Glucose 70 - 99 mg/dL 734(K)  BUN 8 - 23 mg/dL 87(G)  Creatinine 8.11 - 1.24 mg/dL 5.72(I)  Sodium 203 - 559 mmol/L 133(L)  Potassium 3.5 - 5.1 mmol/L 3.0(L)  Chloride 98 - 111 mmol/L 91(L)  CO2 22 - 32 mmol/L 28  Calcium 8.9 - 10.3 mg/dL 7.5(L)  Total Protein 6.5 - 8.1 g/dL 7.0  Total Bilirubin 0.3 - 1.2 mg/dL 0.5  Alkaline Phos 38 - 126 U/L 45  AST 15 - 41 U/L 16  ALT 0 - 44 U/L 7   CBC Latest Ref Rng & Units 01/04/2020  WBC 4.0 - 10.5 K/uL 11.1(H)  Hemoglobin 13.0 - 17.0 g/dL 10.3(L)  Hematocrit 39 - 52 % 30.9(L)  Platelets 150 - 400 K/uL 208    RADIOGRAPHIC STUDIES: I have personally reviewed the radiological images as listed and agreed with the findings in the report. MR Brain W Wo Contrast  Result Date: 12/23/2019 CLINICAL DATA:  Lung cancer staging. EXAM: MRI HEAD WITHOUT AND WITH CONTRAST TECHNIQUE: Multiplanar, multiecho pulse sequences of the brain and surrounding structures were obtained without and with intravenous contrast. CONTRAST:  55mL GADAVIST GADOBUTROL 1 MMOL/ML  IV SOLN COMPARISON:  03/08/2007 MRI head. FINDINGS: Brain: No focal diffusion-weighted signal abnormality. Moderate diffuse parenchymal volume loss with ex vacuo dilatation. Scattered and confluent T2 hyperintense foci involving the supratentorial and infratentorial white matter. Sequela of remote bilateral centrum semiovale, left corona radiata and bilateral thalamic infarcts. Also small remote bilateral cerebellar infarcts. Prominent bilateral basal ganglia perivascular spaces. No midline shift or extra-axial fluid collection. Tiny remote microhemorrhages involving the bilateral cerebrum and right thalamus. No abnormal enhancement. Vascular: Nonocclusive thrombus within the left V4 segment (18:33) with slow flow. Query moderate narrowing of the left greater than right supraclinoid ICAs. Otherwise normal flow voids tiny left occipital developmental venous anomaly. Skull and upper cervical spine: Upper cervical spondylosis. No focal enhancing osseous lesion. Sinuses/Orbits: Normal orbits. Clear paranasal sinuses. No mastoid effusion. Other: None. IMPRESSION: No evidence of intracranial metastases. Remote bilateral centrum semiovale, left corona radiata, bilateral thalamic and bilateral cerebellar infarcts. Moderate cerebral atrophy. Moderate chronic microvascular ischemic changes. Nonocclusive left V4 segment thrombus with slow flow. Moderate narrowing of the bilateral supraclinoid ICAs. Consider MRA head for further evaluation. Electronically Signed   By: Stana Bunting M.D.   On: 12/23/2019 13:33     Assessment and plan  1. Primary adenocarcinoma of left lung (HCC)   2. Malignant pleural effusion   3. ESRD (end stage renal disease) (HCC)   4. Normocytic anemia    Stage IV lung adenocarcinoma with malignant pleural effusion Status post 1 cycle of immunotherapy Keytruda Patient tolerates well. His PET scan was rescheduled.  PET scan was ordered in order to get a new baseline for future comparison  purposes.   #Malignant pleural effusion, patient has pleural  catheter placed, drained by home health care nurse. Minimal for the past 2 weeks.  Will evaluate from next week's PET scan and decide whether pleural catheter should be removed or not.Marland Kitchen  #ESRD on hemodialysis, potassium is 3, creatinine 5.14.  Hold off potassium supplementation due to ESRD. #Anemia, hemoglobin 10.3, likely due to chronic kidney disease.  No iron deficiency, adequate folate.  B12 is pending. #Poor appetite, refer to nutritionist. Follow-up 2 week for evaluation prior to next visit.  Earlie Server, MD, PhD Hematology Oncology [facility] Pager- [phone] 01/13/2020

## 2020-01-18 ENCOUNTER — Ambulatory Visit
Admission: RE | Admit: 2020-01-18 | Discharge: 2020-01-18 | Disposition: A | Discharge status: Home / Self Care | Payer: Medicare Other | Source: Ambulatory Visit | Attending: Oncology | Admitting: Oncology

## 2020-01-18 ENCOUNTER — Inpatient Hospital Stay: Payer: Medicare Other

## 2020-01-18 ENCOUNTER — Other Ambulatory Visit: Payer: Self-pay

## 2020-01-18 DIAGNOSIS — J90 Pleural effusion, not elsewhere classified: Secondary | ICD-10-CM | POA: Insufficient documentation

## 2020-01-18 DIAGNOSIS — Z923 Personal history of irradiation: Secondary | ICD-10-CM | POA: Insufficient documentation

## 2020-01-18 DIAGNOSIS — C3412 Malignant neoplasm of upper lobe, left bronchus or lung: Secondary | ICD-10-CM | POA: Diagnosis not present

## 2020-01-18 LAB — GLUCOSE, CAPILLARY: Glucose-Capillary: 81 mg/dL (ref 70–99)

## 2020-01-18 MED ORDER — FLUDEOXYGLUCOSE F - 18 (FDG) INJECTION
7.2000 | Freq: Once | INTRAVENOUS | Status: AC | PRN
  Administered 2020-01-18: 7.8 via INTRAVENOUS

## 2020-01-19 ENCOUNTER — Other Ambulatory Visit: Payer: Medicare Other

## 2020-01-20 ENCOUNTER — Telehealth: Payer: Self-pay | Admitting: Nurse Practitioner

## 2020-01-20 ENCOUNTER — Telehealth: Payer: Self-pay | Admitting: Pharmacist

## 2020-01-20 NOTE — Telephone Encounter (Signed)
Patient failed to provide requested 2021 financial documentation. Patient has Medicare Part A and Part B. No additional medication assistance will be provided by [facility].  Patient notified by letter. [place] [facility]

## 2020-01-20 NOTE — Telephone Encounter (Signed)
Called patient's home number to schedule the Palliative Consult and spoke with a gentleman who requested that we call back after 3:30 today and call patient on his cell number to speak with him directly.

## 2020-01-23 ENCOUNTER — Telehealth: Payer: Self-pay | Admitting: Adult Health Nurse Practitioner

## 2020-01-23 NOTE — Telephone Encounter (Signed)
Spoke with patient's sister, Hunter Bachar, regarding Palliative services and she was in agreement with scheduling visit.  I have scheduled an In-person consult for 02/01/20 @ 3:30 PM.

## 2020-01-25 ENCOUNTER — Inpatient Hospital Stay (HOSPITAL_BASED_OUTPATIENT_CLINIC_OR_DEPARTMENT_OTHER): Payer: Medicare Other | Admitting: Oncology

## 2020-01-25 ENCOUNTER — Inpatient Hospital Stay: Payer: Medicare Other

## 2020-01-25 ENCOUNTER — Other Ambulatory Visit: Payer: Self-pay

## 2020-01-25 ENCOUNTER — Encounter: Payer: Self-pay | Admitting: Oncology

## 2020-01-25 VITALS — BP 147/76 | HR 73 | Temp 96.9°F | Resp 18 | Wt 141.8 lb

## 2020-01-25 VITALS — BP 159/67 | HR 65 | Resp 18

## 2020-01-25 DIAGNOSIS — D649 Anemia, unspecified: Secondary | ICD-10-CM

## 2020-01-25 DIAGNOSIS — C3492 Malignant neoplasm of unspecified part of left bronchus or lung: Secondary | ICD-10-CM

## 2020-01-25 DIAGNOSIS — J910 Malignant pleural effusion: Secondary | ICD-10-CM | POA: Diagnosis not present

## 2020-01-25 DIAGNOSIS — Z5112 Encounter for antineoplastic immunotherapy: Secondary | ICD-10-CM | POA: Diagnosis not present

## 2020-01-25 DIAGNOSIS — N186 End stage renal disease: Secondary | ICD-10-CM | POA: Diagnosis not present

## 2020-01-25 LAB — CBC WITH DIFFERENTIAL/PLATELET
Abs Immature Granulocytes: 0.05 10*3/uL (ref 0.00–0.07)
Basophils Absolute: 0.1 10*3/uL (ref 0.0–0.1)
Basophils Relative: 1 %
Eosinophils Absolute: 0.4 10*3/uL (ref 0.0–0.5)
Eosinophils Relative: 5 %
HCT: 29.6 % — ABNORMAL LOW (ref 39.0–52.0)
Hemoglobin: 10 g/dL — ABNORMAL LOW (ref 13.0–17.0)
Immature Granulocytes: 1 %
Lymphocytes Relative: 23 %
Lymphs Abs: 1.9 10*3/uL (ref 0.7–4.0)
MCH: 32.8 pg (ref 26.0–34.0)
MCHC: 33.8 g/dL (ref 30.0–36.0)
MCV: 97 fL (ref 80.0–100.0)
Monocytes Absolute: 0.8 10*3/uL (ref 0.1–1.0)
Monocytes Relative: 10 %
Neutro Abs: 5.1 10*3/uL (ref 1.7–7.7)
Neutrophils Relative %: 60 %
Platelets: 149 10*3/uL — ABNORMAL LOW (ref 150–400)
RBC: 3.05 MIL/uL — ABNORMAL LOW (ref 4.22–5.81)
RDW: 14.9 % (ref 11.5–15.5)
WBC: 8.4 10*3/uL (ref 4.0–10.5)
nRBC: 0 % (ref 0.0–0.2)

## 2020-01-25 LAB — COMPREHENSIVE METABOLIC PANEL
ALT: 8 U/L (ref 0–44)
AST: 14 U/L — ABNORMAL LOW (ref 15–41)
Albumin: 3.3 g/dL — ABNORMAL LOW (ref 3.5–5.0)
Alkaline Phosphatase: 47 U/L (ref 38–126)
Anion gap: 11 (ref 5–15)
BUN: 35 mg/dL — ABNORMAL HIGH (ref 8–23)
CO2: 29 mmol/L (ref 22–32)
Calcium: 7.3 mg/dL — ABNORMAL LOW (ref 8.9–10.3)
Chloride: 99 mmol/L (ref 98–111)
Creatinine, Ser: 4.77 mg/dL — ABNORMAL HIGH (ref 0.61–1.24)
GFR calc Af Amer: 13 mL/min — ABNORMAL LOW (ref >60)
GFR calc non Af Amer: 11 mL/min — ABNORMAL LOW (ref >60)
Glucose, Bld: 88 mg/dL (ref 70–99)
Potassium: 3.2 mmol/L — ABNORMAL LOW (ref 3.5–5.1)
Sodium: 139 mmol/L (ref 135–145)
Total Bilirubin: 0.3 mg/dL (ref 0.3–1.2)
Total Protein: 7.5 g/dL (ref 6.5–8.1)

## 2020-01-25 MED ORDER — SODIUM CHLORIDE 0.9 % IV SOLN
200.0000 mg | Freq: Once | INTRAVENOUS | Status: AC
  Administered 2020-01-25: 200 mg via INTRAVENOUS
  Filled 2020-01-25: qty 8

## 2020-01-25 MED ORDER — SODIUM CHLORIDE 0.9 % IV SOLN
Freq: Once | INTRAVENOUS | Status: AC
  Filled 2020-01-25: qty 250

## 2020-01-25 NOTE — Progress Notes (Signed)
Pt here for follow up. Pt reports that Pleurex catheter is not draining much and he is ready to "get it out."

## 2020-01-25 NOTE — Progress Notes (Signed)
Hematology/Oncology Follow Up Note [facility]  [phone] [phone]  Patient Care Team: [name], MD as PCP - General (Family Medicine) Telford Nab, RN as Oncology Nurse Navigator   Name of the patient: Jose [name]  [id number]  Nov 23, 1945   REASON FOR VISIT  follow-up for lung cancer   PERTINENT ONCOLOGY HISTORY 74 y.o. with past medical history hemodialysis ESRD, history of prostate cancer, stage IV lung cancer presents for follow-up # Patient has left pleural catheter placed, per patient he has home care nurse checking in and draining the pleural catheter.  He denies any shortness of breath at resting or with exertion. He lives at home with sister.  His nephew sometimes helps him with transportation.  Identified biggest barrier for him for follow-up visits/treatment visits is transportation.  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 Chronic findings include remote infarcts, moderate cerebral atrophy, chronic ischemic changes, Nonocclusive left V4 segment thrombus with slow flow. Moderate narrowing of the bilateral supraclinoid ICAs  INTERVAL HISTORY Jose [name] is a 74 y.o. male who has above history reviewed by me today presents for follow up visit for management of stage IV lung cancer Problems and complaints are listed below: Status post 1 cycle of Keytruda.  Patient reports feeling at baseline today.  One episode of loose stool after the treatment.  Self resolved.  Last bowel movement was this morning and was formed. Home health nurse has reported that patient has not had much of output from his left side pleural catheter. Patient denies any shortness of breath, chest pain, fever or chills.   Review of Systems  Constitutional: Positive for fatigue. Negative for appetite change, chills,  fever and unexpected weight change.  HENT:   Negative for hearing loss and voice change.   Eyes: Negative for eye problems and icterus.  Respiratory: Negative for chest tightness, cough and shortness of breath.   Cardiovascular: Negative for chest pain and leg swelling.  Gastrointestinal: Negative for abdominal distention and abdominal pain.  Endocrine: Negative for hot flashes.  Genitourinary: Negative for difficulty urinating, dysuria and frequency.   Musculoskeletal: Negative for arthralgias.  Skin: Negative for itching and rash.  Neurological: Negative for light-headedness and numbness.  Hematological: Negative for adenopathy. Does not bruise/bleed easily.  Psychiatric/Behavioral: Negative for confusion.      Allergies  Allergen Reactions  . Penicillin G Other (See Comments)    Other reaction(s): Unknown DID THE REACTION INVOLVE: Swelling of the face/tongue/throat, SOB, or low BP? Unknown Sudden or severe rash/hives, skin peeling, or the inside of the mouth or nose? Unknown Did it require medical treatment? Unknown When did it last happen?unknown If all above answers are "NO", may proceed with cephalosporin use.      Past Medical History:  Diagnosis Date  . Chronic kidney disease   . Depression   . Diabetes mellitus without complication ([place])   . Glaucoma   . Glaucoma   . Gout   . Heart murmur   . HLD (hyperlipidemia)   . Hypertension   . Prostate cancer ([place])   . Renal failure    dialysis t/t/s  . Stroke ([place])    tia x 2     Past Surgical History:  Procedure Laterality Date  . AMPUTATION TOE Bilateral 07/23/2018   Procedure: TOE MPJ RIGHT 3RD AND LEFT 2ND;  Surgeon: Vickki Muff,  Larkin Ina, DPM;  Location: [facility];  Service: Podiatry;  Laterality: Bilateral;  . APPENDECTOMY    . AV FISTULA PLACEMENT Left 06/09/2018   Procedure: ARTERIOVENOUS (AV) FISTULA CREATION ( BRACHIAL CEPHALIC);  Surgeon: Algernon Huxley, MD;  Location: [facility];  Service: Vascular;  Laterality:  Left;  . CHOLECYSTECTOMY    . DIALYSIS/PERMA CATHETER INSERTION N/A 03/24/2018   Procedure: DIALYSIS/PERMA CATHETER INSERTION;  Surgeon: Katha Cabal, MD;  Location: [place] [facility];  Service: Cardiovascular;  Laterality: N/A;  . DIALYSIS/PERMA CATHETER REMOVAL N/A 09/06/2018   Procedure: DIALYSIS/PERMA CATHETER REMOVAL;  Surgeon: Algernon Huxley, MD;  Location: [place] [facility];  Service: Cardiovascular;  Laterality: N/A;  . INSERTION PROSTATE RADIATION SEED    . IR PERC PLEURAL DRAIN W/INDWELL CATH W/IMG GUIDE  11/03/2019  . IRRIGATION AND DEBRIDEMENT FOOT Right 12/17/2018   Procedure: IRRIGATION AND DEBRIDEMENT FOOT;  Surgeon: Samara Deist, DPM;  Location: [facility];  Service: Podiatry;  Laterality: Right;  . LOWER EXTREMITY ANGIOGRAPHY Right 06/16/2018   Procedure: LOWER EXTREMITY ANGIOGRAPHY;  Surgeon: Algernon Huxley, MD;  Location: [place] [facility];  Service: Cardiovascular;  Laterality: Right;  . LOWER EXTREMITY ANGIOGRAPHY Left 08/30/2018   Procedure: LOWER EXTREMITY ANGIOGRAPHY;  Surgeon: Algernon Huxley, MD;  Location: [place] [facility];  Service: Cardiovascular;  Laterality: Left;  . LOWER EXTREMITY ANGIOGRAPHY Right 09/30/2018   Procedure: LOWER EXTREMITY ANGIOGRAPHY;  Surgeon: Algernon Huxley, MD;  Location: [place] [facility];  Service: Cardiovascular;  Laterality: Right;  . LOWER EXTREMITY ANGIOGRAPHY Left 10/07/2018   Procedure: LOWER EXTREMITY ANGIOGRAPHY;  Surgeon: Algernon Huxley, MD;  Location: [place] [facility];  Service: Cardiovascular;  Laterality: Left;  . LOWER EXTREMITY ANGIOGRAPHY Right 12/20/2018   Procedure: Lower Extremity Angiography;  Surgeon: Algernon Huxley, MD;  Location: [place] [facility];  Service: Cardiovascular;  Laterality: Right;  . TRANSMETATARSAL AMPUTATION Bilateral 10/13/2018   Procedure: 1.  Amputation right great toe MTPJ 2.  Amputation right second toe MTPJ 3.  Amputation right fourth toe MTPJ 4.  Amputation right fifth toe MTPJ 5.  Excision  distal second metatarsal head right foot 6.  Excision distal third metatarsal head right foot 7.  Amputation left third toe 8.  Incision and drainage infectionleft 2nd toe amputation site. ;  Surgeon: Samara Deist, DPM;  Location    Social History   Socioeconomic History  . Marital status: Widowed    Spouse name: Not on file  . Number of children: Not on file  . Years of education: Not on file  . Highest education level: Not on file  Occupational History    Employer: RETIRED  Tobacco Use  . Smoking status: Current Some Day Smoker    Packs/day: 0.10    Types: Cigarettes  . Smokeless tobacco: Never Used  . Tobacco comment: pt currently smokes about 3 cigarettes a day   Vaping Use  . Vaping Use: Never used  Substance and Sexual Activity  . Alcohol use: Not Currently    Alcohol/week: 0.0 standard drinks  . Drug use: Never  . Sexual activity: Not Currently  Other Topics Concern  . Not on file  Social History Narrative  . Not on file   Social Determinants of Health   Financial Resource Strain:   . Difficulty of Paying Living Expenses: Not on file  Food Insecurity:   . Worried About Charity fundraiser in the Last Year: Not on file  . Ran Out of Food  in the Last Year: Not on file  Transportation Needs:   . Lack of Transportation (Medical): Not on file  . Lack of Transportation (Non-Medical): Not on file  Physical Activity:   . Days of Exercise per Week: Not on file  . Minutes of Exercise per Session: Not on file  Stress:   . Feeling of Stress : Not on file  Social Connections:   . Frequency of Communication with Friends and Family: Not on file  . Frequency of Social Gatherings with Friends and Family: Not on file  . Attends Religious Services: Not on file  . Active Member of Clubs or Organizations: Not on file  . Attends Archivist Meetings: Not on file  . Marital Status: Not on file  Intimate Partner Violence:   . Fear of Current or Ex-Partner: Not on file    . Emotionally Abused: Not on file  . Physically Abused: Not on file  . Sexually Abused: Not on file    Family History  Problem Relation Age of Onset  . Varicose Veins Neg Hx      Current Outpatient Medications:  .  allopurinol (ZYLOPRIM) 100 MG tablet, Take 1 tablet (100 mg total) by mouth daily., Disp: 30 tablet, Rfl: 2 .  aspirin 81 MG EC tablet, Take 1 tablet (81 mg total) by mouth daily., Disp: 30 tablet, Rfl: 2 .  atorvastatin (LIPITOR) 10 MG tablet, Take 1 tablet (10 mg total) by mouth daily at 6 PM., Disp: 30 tablet, Rfl: 2 .  calcium acetate (PHOSLO) 667 MG capsule, Take 2 capsules (1,334 mg total) by mouth 3 (three) times daily with meals., Disp: 90 capsule, Rfl: 0 .  clopidogrel (PLAVIX) 75 MG tablet, Take 1 tablet (75 mg total) by mouth daily., Disp: 30 tablet, Rfl: 2 .  escitalopram (LEXAPRO) 10 MG tablet, Take 1 tablet (10 mg total) by mouth daily., Disp: 30 tablet, Rfl: 2 .  nicotine (NICODERM CQ - DOSED IN MG/24 HOURS) 21 mg/24hr patch, Place 1 patch (21 mg total) onto the skin daily., Disp: 28 patch, Rfl: 0 .  tamsulosin (FLOMAX) 0.4 MG CAPS capsule, Take 1 capsule (0.4 mg total) by mouth daily., Disp: 30 capsule, Rfl: 2 .  ondansetron (ZOFRAN) 4 MG tablet, Take 1 tablet (4 mg total) by mouth every 6 (six) hours as needed for nausea. (Patient not taking: Reported on 01/04/2020), Disp: 20 tablet, Rfl: 0 .  oxyCODONE (OXY IR/ROXICODONE) 5 MG immediate release tablet, Take 1 tablet (5 mg total) by mouth every 6 (six) hours as needed for severe pain. (Patient not taking: Reported on 01/04/2020), Disp: 10 tablet, Rfl: 0 .  traMADol (ULTRAM) 50 MG tablet, 50 mg. (Patient not taking: Reported on 01/04/2020), Disp: , Rfl:  No current facility-administered medications for this visit.  Facility-Administered Medications Ordered in Other Visits:  .  pembrolizumab (KEYTRUDA) 200 mg in sodium chloride 0.9 % 50 mL chemo infusion, 200 mg, Intravenous, Once, Earlie Server, MD  Physical exam:   Vitals:   01/25/20 1307  BP: (!) 147/76  Pulse: 73  Resp: 18  Temp: (!) 96.9 F (36.1 C)  Weight: 141 lb 12.8 oz (64.3 kg)   Physical Exam Constitutional:      General: He is not in acute distress. HENT:     Head: Normocephalic and atraumatic.  Eyes:     General: No scleral icterus. Cardiovascular:     Rate and Rhythm: Normal rate and regular rhythm.     Heart sounds: Normal heart sounds.  Pulmonary:     Effort: Pulmonary effort is normal. No respiratory distress.     Breath sounds: No wheezing.     Comments: Decreased breath sound bilaterally. Abdominal:     General: Bowel sounds are normal. There is no distension.     Palpations: Abdomen is soft.  Musculoskeletal:        General: No deformity. Normal range of motion.     Cervical back: Normal range of motion and neck supple.  Skin:    General: Skin is warm and dry.     Findings: No erythema or rash.  Neurological:     Mental Status: He is alert and oriented to person, place, and time. Mental status is at baseline.     Cranial Nerves: No cranial nerve deficit.     Coordination: Coordination normal.  Psychiatric:        Mood and Affect: Mood normal.     CMP Latest Ref Rng & Units 01/25/2020  Glucose 70 - 99 mg/dL 88  BUN 8 - 23 mg/dL 35(H)  Creatinine 0.61 - 1.24 mg/dL 4.77(H)  Sodium 135 - 145 mmol/L 139  Potassium 3.5 - 5.1 mmol/L 3.2(L)  Chloride 98 - 111 mmol/L 99  CO2 22 - 32 mmol/L 29  Calcium 8.9 - 10.3 mg/dL 7.3(L)  Total Protein 6.5 - 8.1 g/dL 7.5  Total Bilirubin 0.3 - 1.2 mg/dL 0.3  Alkaline Phos 38 - 126 U/L 47  AST 15 - 41 U/L 14(L)  ALT 0 - 44 U/L 8   CBC Latest Ref Rng & Units 01/25/2020  WBC 4.0 - 10.5 K/uL 8.4  Hemoglobin 13.0 - 17.0 g/dL 10.0(L)  Hematocrit 39 - 52 % 29.6(L)  Platelets 150 - 400 K/uL 149(L)    RADIOGRAPHIC STUDIES: I have personally reviewed the radiological images as listed and agreed with the findings in the report. NM PET Image Initial (PI) Skull Base To  Thigh  Result Date: 01/18/2020 CLINICAL DATA:  Initial treatment strategy for staging of left upper lobe lung cancer. EXAM: NUCLEAR MEDICINE PET SKULL BASE TO THIGH TECHNIQUE: 7.8 mCi F-18 FDG was injected intravenously. Full-ring PET imaging was performed from the skull base to thigh after the radiotracer. CT data was obtained and used for attenuation correction and anatomic localization. Fasting blood glucose: 81 mg/dl COMPARISON:  10/30/2019 chest abdomen and pelvic CTs. FINDINGS: Mediastinal blood pool activity: SUV max 1.9 Liver activity: SUV max Not applicable. NECK: A low left jugular/supraclavicular node measures 7 mm and a S.U.V. max of 3.9 on 59/3. 9 mm on the prior. Incidental CT findings: Bilateral carotid atherosclerosis. CHEST: Left apical lung nodule measures 1.8 cm and a S.U.V. max of 5.8 on 67/3. 1.6 cm on 10/30/2019. Thoracic nodal metastasis. Index right paratracheal node measures 1.1 cm and a S.U.V. max of 5.3 on 84/3. Compare 8 mm on the prior CT. Prevascular node measures 1.0 cm and a S.U.V. max of 4.8 versus 1.3 cm on the prior diagnostic CT. Hypermetabolism within the left pleural space with decreased small left pleural effusion. This hypermetabolism is diffuse, on the order of a S.U.V. max of 4.1 Incidental CT findings: Centrilobular emphysema. Cardiomegaly. Aortic and coronary artery atherosclerosis. Focal outpouching of the descending aorta, similar at 2.2 cm on 94/3. ABDOMEN/PELVIS: Right adrenal hypermetabolism and mild thickening at a S.U.V. max of 3.3. The left adrenal is mildly thickened and nodular, measuring a S.U.V. max of 4.0. Hypermetabolism within the low rectum/anus and adjacent right-side of the ischioanal fossa where there apparent outpouching of  rectal gas. Example at a S.U.V. max of 9.9 on 241/3. The CT appearance is similar to on the prior. Incidental CT findings: Infrarenal aortic dilatation at 2.4 cm. Prior common iliac stent repairs. Bilateral renal atrophy. Upper  pole left renal low-density lesion is likely a cyst. Radiation seeds in the prostate. SKELETON: No abnormal marrow activity. Incidental CT findings: Subtle bilateral femoral head avascular necrosis. IMPRESSION: 1. Left upper lobe primary bronchogenic carcinoma with thoracic and low jugular nodal metastasis. The extent of disease is relatively similar to 10/30/2019 chest CT, with increase in left upper lobe lung nodule, enlargement of some nodes and decreased size of others. 2. Bilateral adrenal hypermetabolism, with adrenal thickening but no well-defined dominant mass. Recommend attention on follow-up. 3. Hypermetabolism corresponding to irregularity of the anterior right-side of the rectum in the setting of prior radiation seeds in the prostate. Consider physical exam correlation to exclude proctitis and/or chronic perianal/perirectal collection. 4. Decrease in left pleural effusion with left pleural catheter in place. Nonspecific left pleural hypermetabolism. 5. Descending thoracic aortic saccular aneurysm, similar. Electronically Signed   By: Abigail Miyamoto M.D.   On: 01/18/2020 16:34     Assessment and plan  1. Primary adenocarcinoma of left lung ([place])   2. Malignant pleural effusion   3. ESRD (end stage renal disease) ([place])   4. Normocytic anemia   5. Encounter for antineoplastic immunotherapy    Stage IV lung adenocarcinoma with malignant pleural effusion Status post 1 cycle of immunotherapy Keytruda Patient tolerates well. Labs are reviewed and discussed with patient Proceed with cycle 2 Keytruda.  Patient's PET scan was rescheduled and has been done. PET scan was independently reviewed by me and discussed with patient. Left upper lobe primary bronchogenic carcinoma with thoracic and low jugular nodal metastasis.  Disease extent is similar to prior studies.  There is increase in the left upper lobe lung nodule, enlargement of some nodes and decreased size of others.  Bilateral adrenal  hypermetabolic some.  Increased metabolic activity in the right side of the rectum, likely secondary to prior radiation seeds in the prostate.  Patient is asymptomatic clinically. There is decrease in the left pleural effusion.  No specific left pleural hypermetabolism.   #Malignant pleural effusion, patient has pleural catheter placed, drained by home health care nurse. There has been minimal output for the past 5 weeks.  Patient prefers to have pleural catheter removed.  Will consult IR to discontinue catheter.  #ESRD on hemodialysis, potassium is 3, creatinine 5.14.  Hold off potassium supplementation due to ESRD. #Anemia, hemoglobin 10.3, likely due to chronic kidney disease.  No iron deficiency, adequate folate.  Normal B12. #Poor appetite, refer to nutritionist.  Discussed with patient about her CDC recommendation of third dose of COVID-19 vaccination for immunocompromised patient including cancer patients.  He voices understanding and plans to get third dose Follow-up 3 weeks for evaluation prior to next visit.  Earlie Server, MD, PhD Hematology Oncology [facility] at [facility] Pager- [phone] 01/25/2020

## 2020-01-26 ENCOUNTER — Telehealth: Payer: Self-pay

## 2020-01-26 NOTE — Progress Notes (Signed)
Patient sister called, made aware for patient to be here @ 0730. Stated understanding.

## 2020-01-26 NOTE — Telephone Encounter (Signed)
Patient scheduled for Pleurx cath removal tomorrow (8/27) at 8 am  for an 8;30 am appt. Please set up transportation and notify pt of appt. Pt to [facility] to medical mall for procedure, it will take about 30 minutes. Pt is aware that he will be called with appt.

## 2020-01-26 NOTE — Telephone Encounter (Signed)
Done.. Pt has been sched for Lucianne Lei pick as requested Spoke with pt sister Deontae Robson  and made her aware.

## 2020-01-27 ENCOUNTER — Other Ambulatory Visit: Payer: Self-pay

## 2020-01-27 ENCOUNTER — Ambulatory Visit
Admission: RE | Admit: 2020-01-27 | Discharge: 2020-01-27 | Disposition: A | Discharge status: Home / Self Care | Payer: Medicare Other | Source: Ambulatory Visit | Attending: Oncology | Admitting: Oncology

## 2020-01-27 DIAGNOSIS — J910 Malignant pleural effusion: Secondary | ICD-10-CM | POA: Insufficient documentation

## 2020-01-27 DIAGNOSIS — C3492 Malignant neoplasm of unspecified part of left bronchus or lung: Secondary | ICD-10-CM | POA: Diagnosis present

## 2020-01-27 MED ORDER — LIDOCAINE HCL (PF) 1 % IJ SOLN
INTRAMUSCULAR | Status: AC
  Filled 2020-01-27: qty 30

## 2020-01-27 NOTE — Discharge Instructions (Signed)
Tunneled Catheter Removal, Care After Refer to this sheet in the next few weeks. These instructions provide you with information about caring for yourself after your procedure. Your health care provider may also give you more specific instructions. Your treatment has been planned according to current medical practices, but problems sometimes occur. Call your health care provider if you have any problems or questions after your procedure. What can I expect after the procedure? After the procedure, it is common to have: Some mild redness, swelling, and pain around your catheter site.   Follow these instructions at home: Incision care  Check your removal site  every day for signs of infection. Check for: More redness, swelling, or pain. More fluid or blood. Warmth. Pus or a bad smell. Remove your dressing in 48hrs leave open to air  Activity  Return to your normal activities as told by your health care provider. Ask your health care provider what activities are safe for you. Do not lift anything that is heavier than 10 lb (4.5 kg) for 3 days  You may shower tomorrow  Contact a health care provider if: You have more fluid or blood coming from your removal site You have more redness, swelling, or pain at your incisions or around the area where your catheter was removed Your removal site feel warm to the touch. You feel unusually weak. You feel nauseous.. Get help right away if You have swelling in your arm, shoulder, neck, or face. You develop chest pain. You have difficulty breathing. You feel dizzy or light-headed. You have pus or a bad smell coming from your removal site You have a fever. You develop bleeding from your removal site, and your bleeding does not stop. This information is not intended to replace advice given to you by your health care provider. Make sure you discuss any questions you have with your health care provider. Document Released: 05/05/2012 Document Revised:  01/20/2016 Document Reviewed: 02/12/2015 Elsevier Interactive Patient Education  2017 [employer] 

## 2020-01-27 NOTE — Procedures (Signed)
  Procedure: Removal of tunnled L chest PleurX catheter   EBL:   minimal Complications:  none immediate  See full dictation in [facility].  Dillard Cannon MD Main # [phone] Pager  [phone]

## 2020-02-01 ENCOUNTER — Other Ambulatory Visit: Payer: Medicare Other | Admitting: Adult Health Nurse Practitioner

## 2020-02-01 ENCOUNTER — Other Ambulatory Visit: Payer: Self-pay

## 2020-02-01 DIAGNOSIS — C3412 Malignant neoplasm of upper lobe, left bronchus or lung: Secondary | ICD-10-CM

## 2020-02-01 DIAGNOSIS — Z515 Encounter for palliative care: Secondary | ICD-10-CM

## 2020-02-01 NOTE — Progress Notes (Signed)
[facility] Consult Note Telephone: [phone]  Fax: [phone]  PATIENT NAME: Jose [name] DOB: 20-May-1946 MRN: [id number]  PRIMARY CARE PROVIDER:   [name], MD  REFERRING PROVIDER:  Billey Chang NP  RESPONSIBLE PARTY:   Self [phone] [name], sister [phone]    RECOMMENDATIONS and PLAN:  1.  Advanced care planning.  Full code.  Started going over ACP.  Left MOST form for patient and family to go over.  Does state that he would want his son, Jose [name], to be his HCPOA.  2.  Functional status.  Patient is able to ambulate with a cane and does use a walker on dialysis days.  Independent of ADLs.  Continent of B&B.  Patient states that he has not had any recent changes in functional status.  Patient lives with his sister who helps with IADLs. Continue supportive care at home.  3.  Nutritional status.  Patient states that appetite is good.  Does state that he has lost weight.  Used to be baseline at 180 pounds about a year ago.  On 03/16/2019 he weighed 164 pounds with BMI of 21.64.  On 01/25/2020 he weighed 141 pounds with BMI 18.71. Which seems to be stable over the past few months.  Continue to monitor for loss of appetite and weight loss with ongoing cancer treatment.  Patient doing well currently and expresses no concerns.  Discussed briefly disease progression and possible side effects of cancer treatment.  Palliative will continue to monitor for symptom management/decline and make recommendations as needed. Next appointment is in 8 weeks.  Encouraged to call with any questions or concerns.  I spent 50 minutes providing this consultation,  from 3:30 to 4:10 including time spent with patient/family, chart review, provider coordination, documentation. More than 50% of the time in this consultation was spent coordinating communication.   HISTORY OF PRESENT ILLNESS:  Jose [name] is a 74 y.o. year old male  with multiple medical problems including lung cancer, ESRD on HD T,TH,Sat, HTN, HLD, DMT2, gout, depression, CHF, h/o CVA and prostate cancer. Palliative Care was asked to help address goals of care. Patient was in hospital 5/22-5/25/21 for pulmonary edema and CHF exacerbation.  He was in hospital again 5/29-11/05/19 for recurrent pleural effusions and pleurex was placed on 11/03/19.  Patient was found to have lung cancer.  Patient has started treatment with Keytruda. Patient states that treatments are going well. Patient had decreased drainage from pleurex and pleurex was removed on 01/23/20. Site from pleurex removal has small scab with no signs of infection noted.   Patient does endorse having more tiredness after dialysis. Denies pain, increased SOB or cough, headaches, dizziness, N/V/D, constipation, dysuria, hematuria, hematochezia.   CODE STATUS: see above  PPS: 60% HOSPICE ELIGIBILITY/DIAGNOSIS: TBD  PHYSICAL EXAM:   General: NAD, frail appearing, thin; patient has AV fistula to upper left arm with thrill felt Extremities: no edema, no joint deformities Skin: no rashes on exposed skin Neurological: Weakness but otherwise nonfocal  PAST MEDICAL HISTORY:  Past Medical History:  Diagnosis Date  . Chronic kidney disease   . Depression   . Diabetes mellitus without complication ([place])   . Glaucoma   . Glaucoma   . Gout   . Heart murmur   . HLD (hyperlipidemia)   . Hypertension   . Prostate cancer ([place])   . Renal failure    dialysis t/t/s  . Stroke ([place])    tia x 2  SOCIAL HX:  Social History   Tobacco Use  . Smoking status: Current Some Day Smoker    Packs/day: 0.10    Types: Cigarettes  . Smokeless tobacco: Never Used  . Tobacco comment: pt currently smokes about 3 cigarettes a day   Substance Use Topics  . Alcohol use: Not Currently    Alcohol/week: 0.0 standard drinks    ALLERGIES:  Allergies  Allergen Reactions  . Penicillin G Other (See Comments)    Other  reaction(s): Unknown DID THE REACTION INVOLVE: Swelling of the face/tongue/throat, SOB, or low BP? Unknown Sudden or severe rash/hives, skin peeling, or the inside of the mouth or nose? Unknown Did it require medical treatment? Unknown When did it last happen?unknown If all above answers are "NO", may proceed with cephalosporin use.      PERTINENT MEDICATIONS:  Outpatient Encounter Medications as of 02/01/2020  Medication Sig  . allopurinol (ZYLOPRIM) 100 MG tablet Take 1 tablet (100 mg total) by mouth daily.  Marland Kitchen aspirin 81 MG EC tablet Take 1 tablet (81 mg total) by mouth daily.  Marland Kitchen atorvastatin (LIPITOR) 10 MG tablet Take 1 tablet (10 mg total) by mouth daily at 6 PM.  . calcium acetate (PHOSLO) 667 MG capsule Take 2 capsules (1,334 mg total) by mouth 3 (three) times daily with meals.  . clopidogrel (PLAVIX) 75 MG tablet Take 1 tablet (75 mg total) by mouth daily.  Marland Kitchen escitalopram (LEXAPRO) 10 MG tablet Take 1 tablet (10 mg total) by mouth daily.  . nicotine (NICODERM CQ - DOSED IN MG/24 HOURS) 21 mg/24hr patch Place 1 patch (21 mg total) onto the skin daily.  . ondansetron (ZOFRAN) 4 MG tablet Take 1 tablet (4 mg total) by mouth every 6 (six) hours as needed for nausea. (Patient not taking: Reported on 01/04/2020)  . oxyCODONE (OXY IR/ROXICODONE) 5 MG immediate release tablet Take 1 tablet (5 mg total) by mouth every 6 (six) hours as needed for severe pain. (Patient not taking: Reported on 01/04/2020)  . tamsulosin (FLOMAX) 0.4 MG CAPS capsule Take 1 capsule (0.4 mg total) by mouth daily.  . traMADol (ULTRAM) 50 MG tablet 50 mg. (Patient not taking: Reported on 01/04/2020)   No facility-administered encounter medications on file as of 02/01/2020.      Ludella Pranger Jenetta Downer, NP

## 2020-02-07 ENCOUNTER — Other Ambulatory Visit: Payer: Self-pay

## 2020-02-07 ENCOUNTER — Encounter: Payer: Self-pay | Admitting: Emergency Medicine

## 2020-02-07 DIAGNOSIS — R42 Dizziness and giddiness: Secondary | ICD-10-CM | POA: Diagnosis not present

## 2020-02-07 DIAGNOSIS — I2510 Atherosclerotic heart disease of native coronary artery without angina pectoris: Secondary | ICD-10-CM | POA: Diagnosis not present

## 2020-02-07 DIAGNOSIS — N186 End stage renal disease: Secondary | ICD-10-CM | POA: Diagnosis not present

## 2020-02-07 DIAGNOSIS — F17210 Nicotine dependence, cigarettes, uncomplicated: Secondary | ICD-10-CM | POA: Diagnosis not present

## 2020-02-07 DIAGNOSIS — Z7982 Long term (current) use of aspirin: Secondary | ICD-10-CM | POA: Diagnosis not present

## 2020-02-07 DIAGNOSIS — I5033 Acute on chronic diastolic (congestive) heart failure: Secondary | ICD-10-CM | POA: Insufficient documentation

## 2020-02-07 DIAGNOSIS — E1122 Type 2 diabetes mellitus with diabetic chronic kidney disease: Secondary | ICD-10-CM | POA: Diagnosis not present

## 2020-02-07 DIAGNOSIS — I132 Hypertensive heart and chronic kidney disease with heart failure and with stage 5 chronic kidney disease, or end stage renal disease: Secondary | ICD-10-CM | POA: Diagnosis not present

## 2020-02-07 LAB — CBC
HCT: 36.8 % — ABNORMAL LOW (ref 39.0–52.0)
Hemoglobin: 12.4 g/dL — ABNORMAL LOW (ref 13.0–17.0)
MCH: 32.4 pg (ref 26.0–34.0)
MCHC: 33.7 g/dL (ref 30.0–36.0)
MCV: 96.1 fL (ref 80.0–100.0)
Platelets: 177 10*3/uL (ref 150–400)
RBC: 3.83 MIL/uL — ABNORMAL LOW (ref 4.22–5.81)
RDW: 15.1 % (ref 11.5–15.5)
WBC: 11.6 10*3/uL — ABNORMAL HIGH (ref 4.0–10.5)
nRBC: 0 % (ref 0.0–0.2)

## 2020-02-07 LAB — BASIC METABOLIC PANEL
Anion gap: 15 (ref 5–15)
BUN: 38 mg/dL — ABNORMAL HIGH (ref 8–23)
CO2: 30 mmol/L (ref 22–32)
Calcium: 8.6 mg/dL — ABNORMAL LOW (ref 8.9–10.3)
Chloride: 91 mmol/L — ABNORMAL LOW (ref 98–111)
Creatinine, Ser: 5.5 mg/dL — ABNORMAL HIGH (ref 0.61–1.24)
GFR calc Af Amer: 11 mL/min — ABNORMAL LOW (ref >60)
GFR calc non Af Amer: 9 mL/min — ABNORMAL LOW (ref >60)
Glucose, Bld: 113 mg/dL — ABNORMAL HIGH (ref 70–99)
Potassium: 3.4 mmol/L — ABNORMAL LOW (ref 3.5–5.1)
Sodium: 136 mmol/L (ref 135–145)

## 2020-02-07 NOTE — ED Triage Notes (Signed)
FIRST NURSE NOTE:  Pt arrived via [facility] from [facility] dialysis with reports of hypotension, per EMS pt was normotensive when they arrived. Pt had completed most of treatment per EMS, pt received 300CC bolus, pt c/o dizziness with standing.

## 2020-02-07 NOTE — ED Triage Notes (Signed)
Pt via ems from dialysis center with reports of hypotension. Pt was normotensive with EMS arrived. Pt was dizzy upon standing when he was at [facility]. Pt completed most of his dialysis treatment - 3.6 L off.  Pt alert & oriented, nad noted,

## 2020-02-08 ENCOUNTER — Emergency Department
Admission: EM | Admit: 2020-02-08 | Discharge: 2020-02-08 | Disposition: A | Discharge status: Home / Self Care | Payer: Medicare Other | Attending: Emergency Medicine | Admitting: Emergency Medicine

## 2020-02-08 DIAGNOSIS — I959 Hypotension, unspecified: Secondary | ICD-10-CM

## 2020-02-08 DIAGNOSIS — E861 Hypovolemia: Secondary | ICD-10-CM

## 2020-02-08 DIAGNOSIS — R42 Dizziness and giddiness: Secondary | ICD-10-CM

## 2020-02-08 LAB — TROPONIN I (HIGH SENSITIVITY): Troponin I (High Sensitivity): 8 ng/L (ref <18)

## 2020-02-08 NOTE — ED Provider Notes (Signed)
[facility] Emergency Department Provider Note   ____________________________________________   First MD Initiated Contact with Patient 02/08/20 1131     (approximate)  I have reviewed the triage vital signs and the nursing notes.   HISTORY  Chief Complaint Dizziness    HPI Jose [name] is a 74 y.o. male with a stated past medical history of end-stage renal disease on dialysis every other day who presents from dialysis clinic with complaints of orthostatic lightheadedness.  EMS noted patient to be hypotensive with worsening hypotension on getting up from a seated position.  They administered 300 mL of normal saline prior to arrival.  Patient was normotensive on arrival and symptoms had significantly improved.  Patient no longer has any complaints upon my evaluation.         Past Medical History:  Diagnosis Date  . Chronic kidney disease   . Depression   . Diabetes mellitus without complication ([place])   . Glaucoma   . Glaucoma   . Gout   . Heart murmur   . HLD (hyperlipidemia)   . Hypertension   . Prostate cancer ([place])   . Renal failure    dialysis t/t/s  . Stroke ([place])    tia x 2    Patient Active Problem List   Diagnosis Date Noted  . Goals of care, counseling/discussion 12/23/2019  . 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 Malignant neoplasm of upper lobe of left lung ([place]) 12/23/2019  . Thickening of wall of gallbladder   . Primary adenocarcinoma of left lung ([place])   . Hypokalemia 10/30/2019  . Chronic diastolic CHF (congestive heart failure) ([place]) 10/30/2019  . Benign essential HTN 10/30/2019  . Malignant neoplasm of unknown origin ([place]) 10/30/2019  . Acute respiratory failure with hypoxia ([place]) 10/30/2019  . End-stage renal disease on hemodialysis ([place]) 10/30/2019  . Diabetes mellitus type 2, controlled, with complications ([place]) 54/62/7035  . SOB (shortness of breath) 10/29/2019  . Pulmonary edema 10/22/2019  . Diarrhea  10/22/2019  . Acute on chronic diastolic CHF (congestive heart failure) ([place]) 10/22/2019  . HLD (hyperlipidemia)   . Stroke ([place])   . Depression   . Tobacco abuse   . Pleural effusion on left   . Acute urinary retention   . Foot ulcer ([place]) 12/15/2018  . Ischemic foot 10/11/2018  . Atherosclerosis of native arteries of the extremities with gangrene ([place]) 07/09/2018  . Gangrene of toe of right foot ([place]) 06/13/2018  . Type II diabetes mellitus with renal manifestations ([place]) 04/16/2018  . Hypertension 04/16/2018  . ESRD on dialysis ([place]) 04/16/2018  . Malnutrition of moderate degree 03/27/2018  . Renal failure 03/21/2018    Past Surgical History:  Procedure Laterality Date  . AMPUTATION TOE Bilateral 07/23/2018   Procedure: TOE MPJ RIGHT 3RD AND LEFT 2ND;  Surgeon: Samara Deist, DPM;  Location: [facility] ORS;  Service: Podiatry;  Laterality: Bilateral;  . APPENDECTOMY    . AV FISTULA PLACEMENT Left 06/09/2018   Procedure: ARTERIOVENOUS (AV) FISTULA CREATION ( BRACHIAL CEPHALIC);  Surgeon: Algernon Huxley, MD;  Location: [facility] ORS;  Service: Vascular;  Laterality: Left;  . CHOLECYSTECTOMY    . DIALYSIS/PERMA CATHETER INSERTION N/A 03/24/2018   Procedure: DIALYSIS/PERMA CATHETER INSERTION;  Surgeon: Katha Cabal, MD;  Location: [place] [facility];  Service: Cardiovascular;  Laterality: N/A;  . DIALYSIS/PERMA CATHETER REMOVAL N/A 09/06/2018   Procedure: DIALYSIS/PERMA CATHETER REMOVAL;  Surgeon: Algernon Huxley, MD;  Location: [place] [facility];  Service: Cardiovascular;  Laterality: N/A;  . INSERTION PROSTATE RADIATION SEED    . IR PERC PLEURAL DRAIN W/INDWELL CATH W/IMG GUIDE  11/03/2019  . IRRIGATION AND DEBRIDEMENT FOOT Right 12/17/2018   Procedure: IRRIGATION AND DEBRIDEMENT FOOT;  Surgeon: Samara Deist, DPM;  Location: [facility] OR[facility];  Service: Podiatry;  Laterality: Right;  . LOWER EXTREMITY ANGIOGRAPHY Right 06/16/2018   Procedure: LOWER EXTREMITY ANGIOGRAPHY;  Surgeon: Algernon Huxley, MD;   Location: [place] [facility];  Service: Cardiovascular;  Laterality: Right;  . LOWER EXTREMITY ANGIOGRAPHY Left 08/30/2018   Procedure: LOWER EXTREMITY ANGIOGRAPHY;  Surgeon: Algernon Huxley, MD;  Location: [place] [facility];  Service: Cardiovascular;  Laterality: Left;  . LOWER EXTREMITY ANGIOGRAPHY Right 09/30/2018   Procedure: LOWER EXTREMITY ANGIOGRAPHY;  Surgeon: Algernon Huxley, MD;  Location: [place] [facility];  Service: Cardiovascular;  Laterality: Right;  . LOWER EXTREMITY ANGIOGRAPHY Left 10/07/2018   Procedure: LOWER EXTREMITY ANGIOGRAPHY;  Surgeon: Algernon Huxley, MD;  Location: [place] [facility];  Service: Cardiovascular;  Laterality: Left;  . LOWER EXTREMITY ANGIOGRAPHY Right 12/20/2018   Procedure: Lower Extremity Angiography;  Surgeon: Algernon Huxley, MD;  Location: [place] [facility];  Service: Cardiovascular;  Laterality: Right;  . TRANSMETATARSAL AMPUTATION Bilateral 10/13/2018   Procedure: 1.  Amputation right great toe MTPJ 2.  Amputation right second toe MTPJ 3.  Amputation right fourth toe MTPJ 4.  Amputation right fifth toe MTPJ 5.  Excision distal second metatarsal head right foot 6.  Excision distal third metatarsal head right foot 7.  Amputation left third toe 8.  Incision and drainage infectionleft 2nd toe amputation site. ;  Surgeon: Samara Deist, DPM;  Location    Prior to Admission medications   Medication Sig Start Date End Date Taking? Authorizing Provider  allopurinol (ZYLOPRIM) 100 MG tablet Take 1 tablet (100 mg total) by mouth daily. 10/24/19   Ezekiel Slocumb, DO  aspirin 81 MG EC tablet Take 1 tablet (81 mg total) by mouth daily. 10/24/19   Ezekiel Slocumb, DO  atorvastatin (LIPITOR) 10 MG tablet Take 1 tablet (10 mg total) by mouth daily at 6 PM. 10/24/19   Nicole Kindred A, DO  calcium acetate (PHOSLO) 667 MG capsule Take 2 capsules (1,334 mg total) by mouth 3 (three) times daily with meals. 12/21/18   Fritzi Mandes, MD  clopidogrel (PLAVIX) 75 MG tablet Take  1 tablet (75 mg total) by mouth daily. 10/24/19   Nicole Kindred A, DO  escitalopram (LEXAPRO) 10 MG tablet Take 1 tablet (10 mg total) by mouth daily. 10/24/19   Ezekiel Slocumb, DO  nicotine (NICODERM CQ - DOSED IN MG/24 HOURS) 21 mg/24hr patch Place 1 patch (21 mg total) onto the skin daily. 11/06/19   Sheikh, Omair Latif, DO  ondansetron (ZOFRAN) 4 MG tablet Take 1 tablet (4 mg total) by mouth every 6 (six) hours as needed for nausea. Patient not taking: Reported on 01/04/2020 11/05/19   Raiford Noble Latif, DO  oxyCODONE (OXY IR/ROXICODONE) 5 MG immediate release tablet Take 1 tablet (5 mg total) by mouth every 6 (six) hours as needed for severe pain. Patient not taking: Reported on 01/04/2020 11/05/19   Raiford Noble Latif, DO  tamsulosin (FLOMAX) 0.4 MG CAPS capsule Take 1 capsule (0.4 mg total) by mouth daily. 10/25/19   Ezekiel Slocumb, DO  traMADol (ULTRAM) 50 MG tablet 50 mg. Patient not taking: Reported on 01/04/2020 03/16/19   [provider]    Allergies Penicillin g  Family  History  Problem Relation Age of Onset  . Varicose Veins Neg Hx     Social History Social History   Tobacco Use  . Smoking status: Current Some Day Smoker    Packs/day: 0.10    Types: Cigarettes  . Smokeless tobacco: Never Used  . Tobacco comment: pt currently smokes about 3 cigarettes a day   Vaping Use  . Vaping Use: Never used  Substance Use Topics  . Alcohol use: Not Currently    Alcohol/week: 0.0 standard drinks  . Drug use: Never    Review of Systems Constitutional: No fever/chills Eyes: No visual changes. ENT: No sore throat. Cardiovascular: Denies chest pain. Respiratory: Denies shortness of breath. Gastrointestinal: No abdominal pain.  No nausea, no vomiting.  No diarrhea. Genitourinary: Negative for dysuria. Musculoskeletal: Negative for acute arthralgias Skin: Negative for rash. Neurological: Negative for headaches, weakness/numbness/paresthesias in any  extremity Psychiatric: Negative for suicidal ideation/homicidal ideation   ____________________________________________   PHYSICAL EXAM:  VITAL SIGNS: ED Triage Vitals  Enc Vitals Group     BP 02/07/20 1804 105/60     Pulse Rate 02/07/20 1804 79     Resp 02/07/20 1804 20     Temp 02/07/20 1804 98.3 F (36.8 C)     Temp Source 02/07/20 1804 Oral     SpO2 02/07/20 1804 100 %     Weight 02/07/20 1805 141 lb 12.1 oz (64.3 kg)     Height 02/07/20 1805 6\' 1"  (1.854 m)     Head Circumference --      Peak Flow --      Pain Score 02/07/20 1805 0     Pain Loc --      Pain Edu? --      Excl. in [place]? --    Constitutional: Alert and oriented. Well appearing and in no acute distress.  Cachectic Eyes: Conjunctivae are normal. PERRL. EOMI. Head: Atraumatic. Nose: No congestion/rhinnorhea. Mouth/Throat: Mucous membranes are moist.  Oropharynx non-erythematous. Neck: No stridor.   Cardiovascular: Normal rate, regular rhythm. Grossly normal heart sounds.  Good peripheral circulation. Respiratory: Normal respiratory effort.  No retractions. Lungs CTAB. Gastrointestinal: Soft and nontender. No distention. No abdominal bruits. No CVA tenderness. Musculoskeletal: No lower extremity tenderness nor edema.  No joint effusions. Neurologic:  Normal speech and language. No gross focal neurologic deficits are appreciated. No gait instability. Skin:  Skin is warm, dry and intact. No rash noted. Psychiatric: Mood and affect are normal. Speech and behavior are normal.  ____________________________________________   LABS (all labs ordered are listed, but only abnormal results are displayed)  Labs Reviewed  BASIC METABOLIC PANEL - Abnormal; Notable for the following components:      Result Value   Potassium 3.4 (*)    Chloride 91 (*)    Glucose, Bld 113 (*)    BUN 38 (*)    Creatinine, Ser 5.50 (*)    Calcium 8.6 (*)    GFR calc non Af Amer 9 (*)    GFR calc Af Amer 11 (*)    All other  components within normal limits  CBC - Abnormal; Notable for the following components:   WBC 11.6 (*)    RBC 3.83 (*)    Hemoglobin 12.4 (*)    HCT 36.8 (*)    All other components within normal limits  URINALYSIS, COMPLETE (UACMP) WITH MICROSCOPIC  CBG MONITORING, ED  TROPONIN I (HIGH SENSITIVITY)   ____________________________________________  EKG  ED ECG REPORT I, Naaman Plummer, the attending  physician, personally viewed and interpreted this ECG.  Date: 02/08/2020 EKG Time: 1804 Rate: 77 Rhythm: normal sinus rhythm QRS Axis: normal Intervals: normal ST/T Wave abnormalities: normal Narrative Interpretation: no evidence of acute ischemia  ____________________________________________  RADIOLOGY  ED MD interpretation:    Official radiology report(s): No results found.  ____________________________________________   PROCEDURES  Procedure(s) performed (including Critical Care):  Procedures   ____________________________________________   INITIAL IMPRESSION / ASSESSMENT AND PLAN / ED COURSE  Based on History, Exam, and Findings, presentation not consistent with syncope, seizure, stroke, meningitis, symptomatic anemia (gastrointestinal bleed), Increased ICP (cerebral tumor/mass), ICH. Additionally, I have a low suspicion for AOM, labyrinthitis, or other infectious process.  Reassessment: Prior to discharge symptoms controlled, patient well appearing. Disposition:  Discharge. Strict return precautions discussed w/ full understanding. Advise follow up with primary care provider within 24-48 hours.      ____________________________________________   FINAL CLINICAL IMPRESSION(S) / ED DIAGNOSES  Final diagnoses:  None     ED Discharge Orders    None       Note:  This document was prepared using Dragon voice recognition software and may include unintentional dictation errors.   Naaman Plummer, MD 02/08/20 1149

## 2020-02-08 NOTE — ED Notes (Signed)
NAD noted at time of D/C. Pt D/C to the lobby to call for a cab to come pick him up. Pt states understanding to call and follow up with Dr. Kary Kos.

## 2020-02-13 ENCOUNTER — Inpatient Hospital Stay: Payer: Medicare Other

## 2020-02-13 ENCOUNTER — Inpatient Hospital Stay (HOSPITAL_BASED_OUTPATIENT_CLINIC_OR_DEPARTMENT_OTHER): Payer: Medicare Other | Admitting: Hospice and Palliative Medicine

## 2020-02-13 ENCOUNTER — Inpatient Hospital Stay: Payer: Medicare Other | Attending: Oncology

## 2020-02-13 ENCOUNTER — Encounter: Payer: Self-pay | Admitting: Hospice and Palliative Medicine

## 2020-02-13 ENCOUNTER — Other Ambulatory Visit: Payer: Self-pay

## 2020-02-13 VITALS — BP 172/78 | HR 56 | Temp 97.4°F | Resp 18 | Wt 145.7 lb

## 2020-02-13 DIAGNOSIS — Z79899 Other long term (current) drug therapy: Secondary | ICD-10-CM | POA: Diagnosis not present

## 2020-02-13 DIAGNOSIS — R197 Diarrhea, unspecified: Secondary | ICD-10-CM | POA: Insufficient documentation

## 2020-02-13 DIAGNOSIS — Z8673 Personal history of transient ischemic attack (TIA), and cerebral infarction without residual deficits: Secondary | ICD-10-CM | POA: Diagnosis not present

## 2020-02-13 DIAGNOSIS — C3492 Malignant neoplasm of unspecified part of left bronchus or lung: Secondary | ICD-10-CM

## 2020-02-13 DIAGNOSIS — J910 Malignant pleural effusion: Secondary | ICD-10-CM | POA: Insufficient documentation

## 2020-02-13 DIAGNOSIS — E785 Hyperlipidemia, unspecified: Secondary | ICD-10-CM | POA: Insufficient documentation

## 2020-02-13 DIAGNOSIS — N186 End stage renal disease: Secondary | ICD-10-CM | POA: Insufficient documentation

## 2020-02-13 DIAGNOSIS — I10 Essential (primary) hypertension: Secondary | ICD-10-CM | POA: Diagnosis not present

## 2020-02-13 DIAGNOSIS — D649 Anemia, unspecified: Secondary | ICD-10-CM | POA: Insufficient documentation

## 2020-02-13 DIAGNOSIS — F329 Major depressive disorder, single episode, unspecified: Secondary | ICD-10-CM | POA: Diagnosis not present

## 2020-02-13 DIAGNOSIS — Z923 Personal history of irradiation: Secondary | ICD-10-CM | POA: Diagnosis not present

## 2020-02-13 DIAGNOSIS — Z7982 Long term (current) use of aspirin: Secondary | ICD-10-CM | POA: Insufficient documentation

## 2020-02-13 DIAGNOSIS — E119 Type 2 diabetes mellitus without complications: Secondary | ICD-10-CM | POA: Insufficient documentation

## 2020-02-13 DIAGNOSIS — Z5112 Encounter for antineoplastic immunotherapy: Secondary | ICD-10-CM | POA: Insufficient documentation

## 2020-02-13 DIAGNOSIS — F17210 Nicotine dependence, cigarettes, uncomplicated: Secondary | ICD-10-CM | POA: Diagnosis not present

## 2020-02-13 DIAGNOSIS — Z515 Encounter for palliative care: Secondary | ICD-10-CM

## 2020-02-13 DIAGNOSIS — Z992 Dependence on renal dialysis: Secondary | ICD-10-CM | POA: Diagnosis not present

## 2020-02-13 DIAGNOSIS — Z8546 Personal history of malignant neoplasm of prostate: Secondary | ICD-10-CM | POA: Diagnosis not present

## 2020-02-13 NOTE — Progress Notes (Signed)
Patient reports he does not take any medications.
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
## 2020-02-15 ENCOUNTER — Inpatient Hospital Stay: Payer: Medicare Other

## 2020-02-15 ENCOUNTER — Inpatient Hospital Stay (HOSPITAL_BASED_OUTPATIENT_CLINIC_OR_DEPARTMENT_OTHER): Payer: Medicare Other | Admitting: Oncology

## 2020-02-15 ENCOUNTER — Encounter: Payer: Self-pay | Admitting: Oncology

## 2020-02-15 ENCOUNTER — Inpatient Hospital Stay: Payer: Medicare Other | Admitting: Hospice and Palliative Medicine

## 2020-02-15 ENCOUNTER — Other Ambulatory Visit: Payer: Self-pay

## 2020-02-15 VITALS — BP 126/52 | HR 64 | Temp 96.6°F | Resp 16 | Wt 139.7 lb

## 2020-02-15 DIAGNOSIS — J910 Malignant pleural effusion: Secondary | ICD-10-CM | POA: Diagnosis not present

## 2020-02-15 DIAGNOSIS — C3492 Malignant neoplasm of unspecified part of left bronchus or lung: Secondary | ICD-10-CM | POA: Diagnosis not present

## 2020-02-15 DIAGNOSIS — Z5112 Encounter for antineoplastic immunotherapy: Secondary | ICD-10-CM | POA: Diagnosis not present

## 2020-02-15 DIAGNOSIS — R197 Diarrhea, unspecified: Secondary | ICD-10-CM

## 2020-02-15 LAB — CBC WITH DIFFERENTIAL/PLATELET
Abs Immature Granulocytes: 0.03 10*3/uL (ref 0.00–0.07)
Basophils Absolute: 0.1 10*3/uL (ref 0.0–0.1)
Basophils Relative: 1 %
Eosinophils Absolute: 0.3 10*3/uL (ref 0.0–0.5)
Eosinophils Relative: 4 %
HCT: 31.9 % — ABNORMAL LOW (ref 39.0–52.0)
Hemoglobin: 10.7 g/dL — ABNORMAL LOW (ref 13.0–17.0)
Immature Granulocytes: 0 %
Lymphocytes Relative: 16 %
Lymphs Abs: 1.2 10*3/uL (ref 0.7–4.0)
MCH: 32.5 pg (ref 26.0–34.0)
MCHC: 33.5 g/dL (ref 30.0–36.0)
MCV: 97 fL (ref 80.0–100.0)
Monocytes Absolute: 0.7 10*3/uL (ref 0.1–1.0)
Monocytes Relative: 10 %
Neutro Abs: 5 10*3/uL (ref 1.7–7.7)
Neutrophils Relative %: 69 %
Platelets: 150 10*3/uL (ref 150–400)
RBC: 3.29 MIL/uL — ABNORMAL LOW (ref 4.22–5.81)
RDW: 14.6 % (ref 11.5–15.5)
WBC: 7.3 10*3/uL (ref 4.0–10.5)
nRBC: 0 % (ref 0.0–0.2)

## 2020-02-15 LAB — COMPREHENSIVE METABOLIC PANEL
ALT: 6 U/L (ref 0–44)
AST: 14 U/L — ABNORMAL LOW (ref 15–41)
Albumin: 3.4 g/dL — ABNORMAL LOW (ref 3.5–5.0)
Alkaline Phosphatase: 38 U/L (ref 38–126)
Anion gap: 12 (ref 5–15)
BUN: 38 mg/dL — ABNORMAL HIGH (ref 8–23)
CO2: 28 mmol/L (ref 22–32)
Calcium: 7.5 mg/dL — ABNORMAL LOW (ref 8.9–10.3)
Chloride: 95 mmol/L — ABNORMAL LOW (ref 98–111)
Creatinine, Ser: 5.41 mg/dL — ABNORMAL HIGH (ref 0.61–1.24)
GFR calc Af Amer: 11 mL/min — ABNORMAL LOW (ref >60)
GFR calc non Af Amer: 10 mL/min — ABNORMAL LOW (ref >60)
Glucose, Bld: 118 mg/dL — ABNORMAL HIGH (ref 70–99)
Potassium: 3.2 mmol/L — ABNORMAL LOW (ref 3.5–5.1)
Sodium: 135 mmol/L (ref 135–145)
Total Bilirubin: 0.5 mg/dL (ref 0.3–1.2)
Total Protein: 7.5 g/dL (ref 6.5–8.1)

## 2020-02-15 LAB — TSH: TSH: 4.544 u[IU]/mL — ABNORMAL HIGH (ref 0.350–4.500)

## 2020-02-15 MED ORDER — DIPHENOXYLATE-ATROPINE 2.5-0.025 MG PO TABS: 1.0000 | ORAL_TABLET | Freq: Two times a day (BID) | ORAL | 0 refills | Status: DC | PRN

## 2020-02-15 NOTE — Progress Notes (Signed)
Patient is unsure of what medications he is states he is taking 6-7 pills every day.  Reports 2 episodes of diarrhea with 1 yesterday and 1 this morning.
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
## 2020-02-22 ENCOUNTER — Other Ambulatory Visit: Payer: Self-pay

## 2020-02-22 ENCOUNTER — Encounter: Payer: Self-pay | Admitting: Oncology

## 2020-02-22 ENCOUNTER — Inpatient Hospital Stay: Payer: Medicare Other

## 2020-02-22 ENCOUNTER — Inpatient Hospital Stay (HOSPITAL_BASED_OUTPATIENT_CLINIC_OR_DEPARTMENT_OTHER): Payer: Medicare Other | Admitting: Oncology

## 2020-02-22 VITALS — BP 114/71 | HR 67 | Temp 96.4°F | Resp 18 | Wt 138.3 lb

## 2020-02-22 DIAGNOSIS — C3492 Malignant neoplasm of unspecified part of left bronchus or lung: Secondary | ICD-10-CM | POA: Diagnosis not present

## 2020-02-22 DIAGNOSIS — J910 Malignant pleural effusion: Secondary | ICD-10-CM

## 2020-02-22 DIAGNOSIS — R634 Abnormal weight loss: Secondary | ICD-10-CM

## 2020-02-22 DIAGNOSIS — R197 Diarrhea, unspecified: Secondary | ICD-10-CM

## 2020-02-22 DIAGNOSIS — Z5112 Encounter for antineoplastic immunotherapy: Secondary | ICD-10-CM

## 2020-02-22 LAB — COMPREHENSIVE METABOLIC PANEL
ALT: 8 U/L (ref 0–44)
AST: 14 U/L — ABNORMAL LOW (ref 15–41)
Albumin: 3.6 g/dL (ref 3.5–5.0)
Alkaline Phosphatase: 41 U/L (ref 38–126)
Anion gap: 12 (ref 5–15)
BUN: 44 mg/dL — ABNORMAL HIGH (ref 8–23)
CO2: 31 mmol/L (ref 22–32)
Calcium: 7.8 mg/dL — ABNORMAL LOW (ref 8.9–10.3)
Chloride: 95 mmol/L — ABNORMAL LOW (ref 98–111)
Creatinine, Ser: 6 mg/dL — ABNORMAL HIGH (ref 0.61–1.24)
GFR calc Af Amer: 10 mL/min — ABNORMAL LOW (ref >60)
GFR calc non Af Amer: 8 mL/min — ABNORMAL LOW (ref >60)
Glucose, Bld: 98 mg/dL (ref 70–99)
Potassium: 3.4 mmol/L — ABNORMAL LOW (ref 3.5–5.1)
Sodium: 138 mmol/L (ref 135–145)
Total Bilirubin: 0.6 mg/dL (ref 0.3–1.2)
Total Protein: 7.7 g/dL (ref 6.5–8.1)

## 2020-02-22 LAB — CBC WITH DIFFERENTIAL/PLATELET
Abs Immature Granulocytes: 0.03 10*3/uL (ref 0.00–0.07)
Basophils Absolute: 0.1 10*3/uL (ref 0.0–0.1)
Basophils Relative: 1 %
Eosinophils Absolute: 0.3 10*3/uL (ref 0.0–0.5)
Eosinophils Relative: 3 %
HCT: 34.2 % — ABNORMAL LOW (ref 39.0–52.0)
Hemoglobin: 11.5 g/dL — ABNORMAL LOW (ref 13.0–17.0)
Immature Granulocytes: 0 %
Lymphocytes Relative: 18 %
Lymphs Abs: 1.4 10*3/uL (ref 0.7–4.0)
MCH: 32.3 pg (ref 26.0–34.0)
MCHC: 33.6 g/dL (ref 30.0–36.0)
MCV: 96.1 fL (ref 80.0–100.0)
Monocytes Absolute: 0.8 10*3/uL (ref 0.1–1.0)
Monocytes Relative: 10 %
Neutro Abs: 5.4 10*3/uL (ref 1.7–7.7)
Neutrophils Relative %: 68 %
Platelets: 168 10*3/uL (ref 150–400)
RBC: 3.56 MIL/uL — ABNORMAL LOW (ref 4.22–5.81)
RDW: 14.8 % (ref 11.5–15.5)
WBC: 8 10*3/uL (ref 4.0–10.5)
nRBC: 0 % (ref 0.0–0.2)

## 2020-02-22 MED ORDER — SODIUM CHLORIDE 0.9 % IV SOLN
200.0000 mg | Freq: Once | INTRAVENOUS | Status: AC
  Administered 2020-02-22: 200 mg via INTRAVENOUS
  Filled 2020-02-22: qty 8

## 2020-02-22 MED ORDER — SODIUM CHLORIDE 0.9 % IV SOLN
Freq: Once | INTRAVENOUS | Status: AC
  Filled 2020-02-22: qty 250

## 2020-02-22 NOTE — Progress Notes (Signed)
Hematology/Oncology Follow Up Note [facility]  [phone] [phone]  Patient Care Team: [name], MD as PCP - General (Family Medicine) Telford Nab, RN as Oncology Nurse Navigator   Name of the patient: Jose [name]  [id number]  31-Oct-1945   REASON FOR VISIT  follow-up for lung cancer   PERTINENT ONCOLOGY HISTORY 74 y.o. with past medical history hemodialysis ESRD, history of prostate cancer, stage IV lung cancer presents for follow-up # Patient has left pleural catheter placed, per patient Jose [name] has home care nurse checking in and draining the pleural catheter.  Jose [name] denies any shortness of breath at resting or with exertion. Jose [name] lives at home with sister.  His nephew sometimes helps him with transportation.  Identified biggest barrier for him for follow-up visits/treatment visits is transportation.  Jose [name] is on dialysis 4 days/week  # Omniseq results showed PD-L1 IHC + TPS 70% #12/23/2019, MRI brain with and without contrast showed no evidence of intracranial metastasis. Chronic findings include remote infarcts, moderate cerebral atrophy, chronic ischemic changes, Nonocclusive left V4 segment thrombus with slow flow. Moderate narrowing of the bilateral supraclinoid ICAs  INTERVAL HISTORY Jose [name] is a 74 y.o. male who has above history reviewed by me today presents for follow up visit for management of stage IV lung cancer Problems and complaints are listed below: Patient reports 1 episode of loose stool yesterday.  Otherwise Jose [name] has not experienced any loose stool recently.  One episode of vomiting yesterday when Jose [name] gets dizzy.  Today Jose [name] has no dizziness. No new complaints. Review of Systems  Constitutional: Positive for fatigue. Negative for appetite change, chills, fever and unexpected weight change.  HENT:   Negative for hearing loss and voice change.   Eyes: Negative for eye problems and icterus.  Respiratory: Negative for  chest tightness, cough and shortness of breath.   Cardiovascular: Negative for chest pain and leg swelling.  Gastrointestinal: Positive for diarrhea. Negative for abdominal distention and abdominal pain.  Endocrine: Negative for hot flashes.  Genitourinary: Negative for difficulty urinating, dysuria and frequency.   Musculoskeletal: Negative for arthralgias.  Skin: Negative for itching and rash.  Neurological: Negative for light-headedness and numbness.  Hematological: Negative for adenopathy. Does not bruise/bleed easily.  Psychiatric/Behavioral: Negative for confusion.       Allergies  Allergen Reactions  . Penicillin G Other (See Comments)    Other reaction(s): Unknown DID THE REACTION INVOLVE: Swelling of the face/tongue/throat, SOB, or low BP? Unknown Sudden or severe rash/hives, skin peeling, or the inside of the mouth or nose? Unknown Did it require medical treatment? Unknown When did it last happen?unknown If all above answers are "NO", may proceed with cephalosporin use.      Past Medical History:  Diagnosis Date  . Chronic kidney disease   . Depression   . Diabetes mellitus without complication ([place])   . Glaucoma   . Glaucoma   . Gout   . Heart murmur   . HLD (hyperlipidemia)   . Hypertension   . Prostate cancer ([place])   . Renal failure    dialysis t/t/s  . 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 AMPUTATION TOE Bilateral 07/23/2018   Procedure: TOE MPJ RIGHT 3RD AND LEFT 2ND;  Surgeon: Samara Deist, DPM;  Location: [facility];  Service: Podiatry;  Laterality: Bilateral;  . APPENDECTOMY    . AV FISTULA PLACEMENT Left 06/09/2018  Procedure: ARTERIOVENOUS (AV) FISTULA CREATION ( BRACHIAL CEPHALIC);  Surgeon: Algernon Huxley, MD;  Location: [facility];  Service: Vascular;  Laterality: Left;  . CHOLECYSTECTOMY    . DIALYSIS/PERMA CATHETER INSERTION N/A 03/24/2018   Procedure: DIALYSIS/PERMA CATHETER INSERTION;  Surgeon:  Katha Cabal, MD;  Location: [place] [facility];  Service: Cardiovascular;  Laterality: N/A;  . DIALYSIS/PERMA CATHETER REMOVAL N/A 09/06/2018   Procedure: DIALYSIS/PERMA CATHETER REMOVAL;  Surgeon: Algernon Huxley, MD;  Location: [place] [facility];  Service: Cardiovascular;  Laterality: N/A;  . INSERTION PROSTATE RADIATION SEED    . IR PERC PLEURAL DRAIN W/INDWELL CATH W/IMG GUIDE  11/03/2019  . IRRIGATION AND DEBRIDEMENT FOOT Right 12/17/2018   Procedure: IRRIGATION AND DEBRIDEMENT FOOT;  Surgeon: Samara Deist, DPM;  Location: [facility];  Service: Podiatry;  Laterality: Right;  . LOWER EXTREMITY ANGIOGRAPHY Right 06/16/2018   Procedure: LOWER EXTREMITY ANGIOGRAPHY;  Surgeon: Algernon Huxley, MD;  Location: [place] [facility];  Service: Cardiovascular;  Laterality: Right;  . LOWER EXTREMITY ANGIOGRAPHY Left 08/30/2018   Procedure: LOWER EXTREMITY ANGIOGRAPHY;  Surgeon: Algernon Huxley, MD;  Location: [place] [facility];  Service: Cardiovascular;  Laterality: Left;  . LOWER EXTREMITY ANGIOGRAPHY Right 09/30/2018   Procedure: LOWER EXTREMITY ANGIOGRAPHY;  Surgeon: Algernon Huxley, MD;  Location: [place] [facility];  Service: Cardiovascular;  Laterality: Right;  . LOWER EXTREMITY ANGIOGRAPHY Left 10/07/2018   Procedure: LOWER EXTREMITY ANGIOGRAPHY;  Surgeon: Algernon Huxley, MD;  Location: [place] [facility];  Service: Cardiovascular;  Laterality: Left;  . LOWER EXTREMITY ANGIOGRAPHY Right 12/20/2018   Procedure: Lower Extremity Angiography;  Surgeon: Algernon Huxley, MD;  Location: [place] [facility];  Service: Cardiovascular;  Laterality: Right;  . TRANSMETATARSAL AMPUTATION Bilateral 10/13/2018   Procedure: 1.  Amputation right great toe MTPJ 2.  Amputation right second toe MTPJ 3.  Amputation right fourth toe MTPJ 4.  Amputation right fifth toe MTPJ 5.  Excision distal second metatarsal head right foot 6.  Excision distal third metatarsal head right foot 7.  Amputation left third toe 8.  Incision and  drainage infectionleft 2nd toe amputation site. ;  Surgeon: Samara Deist, DPM;  Location    Social History   Socioeconomic History  . Marital status: Widowed    Spouse name: Not on file  . Number of children: Not on file  . Years of education: Not on file  . Highest education level: Not on file  Occupational History    Employer: RETIRED  Tobacco Use  . Smoking status: Current Some Day Smoker    Packs/day: 0.10    Types: Cigarettes  . Smokeless tobacco: Never Used  . Tobacco comment: pt currently smokes about 3 cigarettes a day   Vaping Use  . Vaping Use: Never used  Substance and Sexual Activity  . Alcohol use: Not Currently    Alcohol/week: 0.0 standard drinks  . Drug use: Never  . Sexual activity: Not Currently  Other Topics Concern  . Not on file  Social History Narrative  . Not on file   Social Determinants of Health   Financial Resource Strain:   . Difficulty of Paying Living Expenses: Not on file  Food Insecurity:   . Worried About Charity fundraiser in the Last Year: Not on file  . Ran Out of Food in the Last Year: Not on file  Transportation Needs:   . Lack of Transportation (Medical): Not on file  . Lack of Transportation (Non-Medical):  Not on file  Physical Activity:   . Days of Exercise per Week: Not on file  . Minutes of Exercise per Session: Not on file  Stress:   . Feeling of Stress : Not on file  Social Connections:   . Frequency of Communication with Friends and Family: Not on file  . Frequency of Social Gatherings with Friends and Family: Not on file  . Attends Religious Services: Not on file  . Active Member of Clubs or Organizations: Not on file  . Attends Archivist Meetings: Not on file  . Marital Status: Not on file  Intimate Partner Violence:   . Fear of Current or Ex-Partner: Not on file  . Emotionally Abused: Not on file  . Physically Abused: Not on file  . Sexually Abused: Not on file    Family History  Problem  Relation Age of Onset  . Varicose Veins Neg Hx      Current Outpatient Medications:  .  allopurinol (ZYLOPRIM) 100 MG tablet, Take 1 tablet (100 mg total) by mouth daily., Disp: 30 tablet, Rfl: 2 .  aspirin 81 MG EC tablet, Take 1 tablet (81 mg total) by mouth daily., Disp: 30 tablet, Rfl: 2 .  atorvastatin (LIPITOR) 10 MG tablet, Take 1 tablet (10 mg total) by mouth daily at 6 PM., Disp: 30 tablet, Rfl: 2 .  calcium acetate (PHOSLO) 667 MG capsule, Take 2 capsules (1,334 mg total) by mouth 3 (three) times daily with meals., Disp: 90 capsule, Rfl: 0 .  clopidogrel (PLAVIX) 75 MG tablet, Take 1 tablet (75 mg total) by mouth daily., Disp: 30 tablet, Rfl: 2 .  escitalopram (LEXAPRO) 10 MG tablet, Take 1 tablet (10 mg total) by mouth daily., Disp: 30 tablet, Rfl: 2 .  nicotine (NICODERM CQ - DOSED IN MG/24 HOURS) 21 mg/24hr patch, Place 1 patch (21 mg total) onto the skin daily., Disp: 28 patch, Rfl: 0 .  ondansetron (ZOFRAN) 4 MG tablet, Take 1 tablet (4 mg total) by mouth every 6 (six) hours as needed for nausea., Disp: 20 tablet, Rfl: 0 .  tamsulosin (FLOMAX) 0.4 MG CAPS capsule, Take 1 capsule (0.4 mg total) by mouth daily., Disp: 30 capsule, Rfl: 2 .  diphenoxylate-atropine (LOMOTIL) 2.5-0.025 MG tablet, Take 1 tablet by mouth every 12 (twelve) hours as needed for diarrhea or loose stools. (Patient not taking: Reported on 02/22/2020), Disp: 14 tablet, Rfl: 0 .  oxyCODONE (OXY IR/ROXICODONE) 5 MG immediate release tablet, Take 1 tablet (5 mg total) by mouth every 6 (six) hours as needed for severe pain. (Patient not taking: Reported on 01/04/2020), Disp: 10 tablet, Rfl: 0 .  traMADol (ULTRAM) 50 MG tablet, 50 mg. (Patient not taking: Reported on 01/04/2020), Disp: , Rfl:   Physical exam:  Vitals:   02/22/20 0907  BP: 114/71  Pulse: 67  Resp: 18  Temp: (!) 96.4 F (35.8 C)  Weight: 138 lb 4.8 oz (62.7 kg)   Physical Exam Constitutional:      General: Jose [name] is not in acute distress.     Appearance: Jose [name] is not diaphoretic.     Comments: Patient sits in the wheelchair  HENT:     Head: Normocephalic and atraumatic.     Nose: Nose normal.     Mouth/Throat:     Pharynx: No oropharyngeal exudate.  Eyes:     General: No scleral icterus.    Pupils: Pupils are equal, round, and reactive to light.  Cardiovascular:     Rate and  Rhythm: Normal rate and regular rhythm.     Heart sounds: No murmur heard.   Pulmonary:     Effort: Pulmonary effort is normal. No respiratory distress.     Breath sounds: No rales.     Comments: Severely decreased breath sound bilaterally.  Left worse than right Chest:     Chest wall: No tenderness.  Abdominal:     General: There is no distension.     Palpations: Abdomen is soft.     Tenderness: There is no abdominal tenderness.  Musculoskeletal:        General: Normal range of motion.     Cervical back: Normal range of motion and neck supple.  Skin:    General: Skin is warm and dry.     Findings: No erythema.  Neurological:     Mental Status: Jose [name] is alert and oriented to person, place, and time.     Cranial Nerves: No cranial nerve deficit.     Motor: No abnormal muscle tone.     Coordination: Coordination normal.  Psychiatric:        Mood and Affect: Affect normal.       CMP Latest Ref Rng & Units 02/22/2020  Glucose 70 - 99 mg/dL 98  BUN 8 - 23 mg/dL 44(H)  Creatinine 0.61 - 1.24 mg/dL 6.00(H)  Sodium 135 - 145 mmol/L 138  Potassium 3.5 - 5.1 mmol/L 3.4(L)  Chloride 98 - 111 mmol/L 95(L)  CO2 22 - 32 mmol/L 31  Calcium 8.9 - 10.3 mg/dL 7.8(L)  Total Protein 6.5 - 8.1 g/dL 7.7  Total Bilirubin 0.3 - 1.2 mg/dL 0.6  Alkaline Phos 38 - 126 U/L 41  AST 15 - 41 U/L 14(L)  ALT 0 - 44 U/L 8   CBC Latest Ref Rng & Units 02/22/2020  WBC 4.0 - 10.5 K/uL 8.0  Hemoglobin 13.0 - 17.0 g/dL 11.5(L)  Hematocrit 39 - 52 % 34.2(L)  Platelets 150 - 400 K/uL 168    RADIOGRAPHIC STUDIES: I have personally reviewed the radiological images as  listed and agreed with the findings in the report. No results found.   Assessment and plan  1. Primary adenocarcinoma of left lung ([place])   2. Encounter for antineoplastic immunotherapy   3. Malignant pleural effusion   4. Diarrhea, unspecified type   5. 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  Proceed with cycle 3 Keytruda. Plan to repeat images after 4 cycles of Keytruda.  #Mild diarrhea, we discussed with patient and recommend him to try lomotil 2.5-0.12m Q12 hours as need for diarrhea.   #Malignant pleural effusion,  pleural catheter has been discontinued due to minimal output.  Physical examination findings are stable.  Continue monitor.  #ESRD on hemodialysis,   Hold off potassium supplementation due to ESRD. #Anemia, hemoglobin 10.7, likely due to chronic kidney disease.  No iron deficiency, adequate folate.  Normal B12. #Poor appetite and weight loss, follow up with nutritionist.  Follow-up 3 week  for evaluation of diarrhea and Keytruda.   ZEarlie Server MD, PhD Hematology Oncology CCentral Indiana Surgery Centerat AKindred Hospital - SycamorePager- [phone]/22/2021

## 2020-02-22 NOTE — Progress Notes (Signed)
Pt here for follow up. Pt reports that he had an episode of vomiting yesterday. No further concerns today.

## 2020-03-05 ENCOUNTER — Telehealth: Payer: Self-pay

## 2020-03-05 NOTE — Telephone Encounter (Signed)
Nutrition  RD called patient this am using number listed in chart and no answer or option to leave voicemail.  RD called number back this pm and patient's sister answered phone. Sister reports that she is getting off work and is not at home with patient at the present time.  RD asked for another number that patient can be reached at but not available.  RD unable to speak with patient at this time.   Will try at another time.    Linsey Arteaga B. Zenia Resides, [place], [place] Registered Dietitian [phone] (mobile)

## 2020-03-14 ENCOUNTER — Inpatient Hospital Stay: Payer: Medicare Other

## 2020-03-14 ENCOUNTER — Inpatient Hospital Stay: Payer: Medicare Other | Attending: Oncology

## 2020-03-14 ENCOUNTER — Inpatient Hospital Stay: Payer: Medicare Other | Admitting: Oncology

## 2020-03-26 ENCOUNTER — Other Ambulatory Visit: Payer: Self-pay

## 2020-03-26 ENCOUNTER — Other Ambulatory Visit: Payer: Medicare Other | Admitting: Nurse Practitioner

## 2020-03-26 ENCOUNTER — Encounter: Payer: Self-pay | Admitting: Nurse Practitioner

## 2020-03-26 DIAGNOSIS — Z515 Encounter for palliative care: Secondary | ICD-10-CM

## 2020-03-26 DIAGNOSIS — C3412 Malignant neoplasm of upper lobe, left bronchus or lung: Secondary | ICD-10-CM

## 2020-03-26 NOTE — Progress Notes (Signed)
[facility] Consult Note Telephone: [phone]  Fax: [phone]  PATIENT NAME: Jose [name] DOB: February 18, 1946 MRN: [id number]  PRIMARY CARE PROVIDER:   [name], MD  REFERRING PROVIDER:  [name], MD [address] [facility] [place],  [place] [address]  RESPONSIBLE PARTY:   Self  1. Advance Care Planning; Full code with aggressive interventions  2. Goals of Care: Goals include to maximize quality of life and symptom management. Our advance care planning conversation included a discussion about:     The value and importance of advance care planning   Exploration of personal, cultural or spiritual beliefs that might influence medical decisions   Exploration of goals of care in the event of a sudden injury or illness   Identification and preparation of a healthcare agent   Review and updating or creation of an  advance directive document.  3. Palliative care encounter; Palliative care encounter; Palliative medicine team will continue to support patient, patient's family, and medical team. Visit consisted of counseling and education dealing with the complex and emotionally intense issues of symptom management and palliative care in the setting of serious and potentially life-threatening illness  4. f/u 6 weeks or sooner if symptoms worsen  I spent 50 minutes providing this consultation,  from 2:45. More than 50% of the time in this consultation was spent coordinating communication.   HISTORY OF PRESENT ILLNESS:  Jose [name] is a 74 y.o. year old male with multiple medical problems including lung cancer, ESRD on HD T,TH,Sat, HTN, HLD, DMT2, gout, depression, CHF, h/o CVA and prostate cancer. Palliative Care was asked to help address goals of care. Patient was in hospital 5/22-5/25/21 for pulmonary edema and CHF exacerbation. In-person follow-up visit with Jose [name] for ongoing monitoring for weight, symptoms of  pain, ongoing discussions of goals of care. Jose [name] does continue to go to hemodialysis with next visit tomorrow as he goes to dialysis on Tuesday, Thursday, Saturday. We talked about in stage renal disease requiring hemodialysis to sustain life. Jose. [name] [name] he does get a little dizzy during dialysis and sometimes vomits. Jose [name] [name] though that he is okay with the side effects. We talked about different interventions that are helpful. We talked about quality of life and medical goals of care. Jose [name] [name] his wishes are to be a full code, aggressive interventions. Jose [name] is he wants to live as long as possible. We talked about chronic disease progression, realistic expectations. We talked about symptoms of pain when she currently denies. Jose [name] he does become stiff when he sits for long periods of time. We talked about his appetite and weight loss. We talked about foods that he likes to eat such as vegetables. We talked about residing at his sister's home who helps care for him. Jose [name] most of the time he is able to do his adl's independently. Jose. [name] [name] normally his sister just cooks for him. We talked about his appointment with Oncologist Dr Tasia Catchings. We talked about follow-up appointments and Chemotherapy, Keytruda. Jose [name] [name] that is sister also keeps up with his appointments. 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 Jose [name] wife passed away about nine months ago and they're after he had two hospitalizations that required him to go on hemodialysis. Jose [name] talked about this last year has been really hard as  he had been married for 18 years to his wife and they have one son and two granddaughters. Therapeutic listening, emotional support provided. We talked about role of palliative care and plan of care. Discussed follow up palliative care visit as  he is high risk for re-hospitalization due to End Stage Renal Disease, lung cancer with ongoing treatment with Chemotherapy. Appointment scheduled in JoseAyala in agreement. Questions answered to satisfaction. Contact information given.  Palliative Care was asked to help to continue to address goals of care.   CODE STATUS: Full code  PPS: 60% HOSPICE ELIGIBILITY/DIAGNOSIS: TBD  PAST MEDICAL HISTORY:  Past Medical History:  Diagnosis Date  . Chronic kidney disease   . Depression   . Diabetes mellitus without complication ([place])   . Glaucoma   . Glaucoma   . Gout   . Heart murmur   . HLD (hyperlipidemia)   . Hypertension   . Prostate cancer ([place])   . Renal failure    dialysis t/t/s  . 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 Smoking status: Current Some Day Smoker    Packs/day: 0.10    Types: Cigarettes  . Smokeless tobacco: Never Used  . Tobacco comment: pt currently smokes about 3 cigarettes a day   Substance Use Topics  . Alcohol use: Not Currently    Alcohol/week: 0.0 standard drinks    ALLERGIES:  Allergies  Allergen Reactions  . Penicillin G Other (See Comments)    Other reaction(s): Unknown DID THE REACTION INVOLVE: Swelling of the face/tongue/throat, SOB, or low BP? Unknown Sudden or severe rash/hives, skin peeling, or the inside of the mouth or nose? Unknown Did it require medical treatment? Unknown When did it last happen?unknown If all above answers are "NO", may proceed with cephalosporin use.       PHYSICAL EXAM:   General: NAD, frail appearing, thin, pleasant male Cardiovascular: regular rate and rhythm Pulmonary: clear ant fields Neurological: generalized weakness  Wanna Gully Ihor Gully, NP

## 2020-04-09 ENCOUNTER — Inpatient Hospital Stay: Payer: Medicare Other | Attending: Hospice and Palliative Medicine | Admitting: Hospice and Palliative Medicine

## 2020-04-09 ENCOUNTER — Inpatient Hospital Stay: Payer: Medicare Other

## 2020-04-09 ENCOUNTER — Encounter: Payer: Self-pay | Admitting: Hospice and Palliative Medicine

## 2020-04-09 VITALS — BP 166/81 | HR 72 | Temp 97.5°F | Resp 18 | Wt 150.5 lb

## 2020-04-09 DIAGNOSIS — F17210 Nicotine dependence, cigarettes, uncomplicated: Secondary | ICD-10-CM | POA: Diagnosis not present

## 2020-04-09 DIAGNOSIS — N186 End stage renal disease: Secondary | ICD-10-CM | POA: Diagnosis not present

## 2020-04-09 DIAGNOSIS — J910 Malignant pleural effusion: Secondary | ICD-10-CM | POA: Diagnosis not present

## 2020-04-09 DIAGNOSIS — Z8546 Personal history of malignant neoplasm of prostate: Secondary | ICD-10-CM | POA: Insufficient documentation

## 2020-04-09 DIAGNOSIS — Z5112 Encounter for antineoplastic immunotherapy: Secondary | ICD-10-CM | POA: Diagnosis not present

## 2020-04-09 DIAGNOSIS — D649 Anemia, unspecified: Secondary | ICD-10-CM | POA: Insufficient documentation

## 2020-04-09 DIAGNOSIS — Z923 Personal history of irradiation: Secondary | ICD-10-CM | POA: Diagnosis not present

## 2020-04-09 DIAGNOSIS — Z7984 Long term (current) use of oral hypoglycemic drugs: Secondary | ICD-10-CM | POA: Diagnosis not present

## 2020-04-09 DIAGNOSIS — Z515 Encounter for palliative care: Secondary | ICD-10-CM

## 2020-04-09 DIAGNOSIS — F329 Major depressive disorder, single episode, unspecified: Secondary | ICD-10-CM | POA: Insufficient documentation

## 2020-04-09 DIAGNOSIS — Z8673 Personal history of transient ischemic attack (TIA), and cerebral infarction without residual deficits: Secondary | ICD-10-CM | POA: Diagnosis not present

## 2020-04-09 DIAGNOSIS — Z992 Dependence on renal dialysis: Secondary | ICD-10-CM | POA: Insufficient documentation

## 2020-04-09 DIAGNOSIS — E119 Type 2 diabetes mellitus without complications: Secondary | ICD-10-CM | POA: Diagnosis not present

## 2020-04-09 DIAGNOSIS — C3492 Malignant neoplasm of unspecified part of left bronchus or lung: Secondary | ICD-10-CM | POA: Diagnosis present

## 2020-04-09 DIAGNOSIS — Z7982 Long term (current) use of aspirin: Secondary | ICD-10-CM | POA: Diagnosis not present

## 2020-04-09 DIAGNOSIS — E785 Hyperlipidemia, unspecified: Secondary | ICD-10-CM | POA: Diagnosis not present

## 2020-04-09 DIAGNOSIS — I10 Essential (primary) hypertension: Secondary | ICD-10-CM | POA: Insufficient documentation

## 2020-04-09 DIAGNOSIS — Z79899 Other long term (current) drug therapy: Secondary | ICD-10-CM | POA: Insufficient documentation

## 2020-04-09 NOTE — Progress Notes (Signed)
Patient reports occasional dizziness with standing.  His appetite is slightly improving and has gained 12 lbs.

## 2020-04-09 NOTE — Progress Notes (Signed)
Nutrition Follow-up:  Patient with lung cancer receiving [name].  Noted patient was no show on 10/13 for treatment.  Met with patient prior to NP visit.  Patient reports that his appetite has increased.  He reports eating 3 eggs for breakfast and sausage.  Usually skips lunch.  Supper is meat and couple sides. Sometimes has dessert.  Has not been drinking any shakes.  Denies nausea, constipation or diarrhea.  Lives with sister.  Reports continued concerns with nephew taking money and food.  Confirms that he has access to food in the house.     Medications: reviewed  Labs: none taken today  Anthropometrics:   Weight 150 lb today increased from 141 lb on 9/7.    Says that his dry weight at dialysis is 69kg (151 lb)   NUTRITION DIAGNOSIS: Unintentional weight loss improved   INTERVENTION:  Patient has been reschedule for treatment of keytruda for 11/15.   Encouraged patient to continue eating high calorie, high protein foods to prevent weight loss.     MONITORING, EVALUATION, GOAL: weight trends, intake   NEXT VISIT: Dec 6 phone visit.  Patient request RD to call sister.  Jose [name] B. Jose [name], [place], [place] Registered Dietitian [phone] (mobile)

## 2020-04-09 NOTE — Progress Notes (Signed)
[facility]  [phone] [phone]   Name: Jose [name] Date: 04/09/2020 MRN: [id number]  DOB: [birth year]  Patient Care Team: [name], MD as PCP - General (Family Medicine) Telford Nab, RN as Oncology Nurse Navigator    REASON FOR CONSULTATION: [name] is a 74 y.o. male with multiple medical problems including stage IV lung cancer on immunotherapy, malignant pleural effusion status post Pleurx, and ESRD on hemodialysis.  Palliative care was consulted up address goals and manage ongoing symptoms.   SOCIAL HISTORY:     reports that he has been smoking cigarettes. He has been smoking about 0.10 packs per day. He has never used smokeless tobacco. He reports previous alcohol use. He reports that he does not use drugs.  Patient is a widower.  Patient has a son who lives nearby.  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  Patient says at baseline he lives at home with his sister.  However, she works most of the day so he is home alone.  He says he is ambulatory with use of a walker and is able to provide for his own self-care.  ADVANCE DIRECTIVES:  Not on file  CODE STATUS: Full code  PAST MEDICAL HISTORY: Past Medical History:  Diagnosis Date  . Chronic kidney disease   . Depression   . Diabetes mellitus without complication ([place])   . Glaucoma   . Glaucoma   . Gout   . Heart murmur   . HLD (hyperlipidemia)   . Hypertension   . Primary adenocarcinoma of left lung ([place])   . Prostate cancer ([place])   . Renal failure    dialysis t/t/s  . Stroke ([place])    tia x 2    PAST SURGICAL HISTORY:  Past Surgical History:  Procedure Laterality Date  . AMPUTATION TOE Bilateral 07/23/2018   Procedure: TOE MPJ RIGHT 3RD AND LEFT 2ND;  Surgeon: Samara Deist, DPM;  Location: [facility];  Service: Podiatry;  Laterality: Bilateral;  . APPENDECTOMY    . AV FISTULA PLACEMENT Left  06/09/2018   Procedure: ARTERIOVENOUS (AV) FISTULA CREATION ( BRACHIAL CEPHALIC);  Surgeon: Algernon Huxley, MD;  Location: [facility];  Service: Vascular;  Laterality: Left;  . CHOLECYSTECTOMY    . DIALYSIS/PERMA CATHETER INSERTION N/A 03/24/2018   Procedure: DIALYSIS/PERMA CATHETER INSERTION;  Surgeon: Katha Cabal, MD;  Location: [place] [facility];  Service: Cardiovascular;  Laterality: N/A;  . DIALYSIS/PERMA CATHETER REMOVAL N/A 09/06/2018   Procedure: DIALYSIS/PERMA CATHETER REMOVAL;  Surgeon: Algernon Huxley, MD;  Location: [place] [facility];  Service: Cardiovascular;  Laterality: N/A;  . INSERTION PROSTATE RADIATION SEED    . IR PERC PLEURAL DRAIN W/INDWELL CATH W/IMG GUIDE  11/03/2019  . IRRIGATION AND DEBRIDEMENT FOOT Right 12/17/2018   Procedure: IRRIGATION AND DEBRIDEMENT FOOT;  Surgeon: Samara Deist, DPM;  Location: [facility];  Service: Podiatry;  Laterality: Right;  . LOWER EXTREMITY ANGIOGRAPHY Right 06/16/2018   Procedure: LOWER EXTREMITY ANGIOGRAPHY;  Surgeon: Algernon Huxley, MD;  Location: [place] [facility];  Service: Cardiovascular;  Laterality: Right;  . LOWER EXTREMITY ANGIOGRAPHY Left 08/30/2018   Procedure: LOWER EXTREMITY ANGIOGRAPHY;  Surgeon: Algernon Huxley, MD;  Location: [place] [facility];  Service: Cardiovascular;  Laterality: Left;  . LOWER EXTREMITY ANGIOGRAPHY Right 09/30/2018   Procedure: LOWER EXTREMITY ANGIOGRAPHY;  Surgeon: Algernon Huxley, MD;  Location: [place] [facility];  Service: Cardiovascular;  Laterality: Right;  .  LOWER EXTREMITY ANGIOGRAPHY Left 10/07/2018   Procedure: LOWER EXTREMITY ANGIOGRAPHY;  Surgeon: Algernon Huxley, MD;  Location: [place] [facility];  Service: Cardiovascular;  Laterality: Left;  . LOWER EXTREMITY ANGIOGRAPHY Right 12/20/2018   Procedure: Lower Extremity Angiography;  Surgeon: Algernon Huxley, MD;  Location: [place] [facility];  Service: Cardiovascular;  Laterality: Right;  . TRANSMETATARSAL AMPUTATION Bilateral 10/13/2018   Procedure:  1.  Amputation right great toe MTPJ 2.  Amputation right second toe MTPJ 3.  Amputation right fourth toe MTPJ 4.  Amputation right fifth toe MTPJ 5.  Excision distal second metatarsal head right foot 6.  Excision distal third metatarsal head right foot 7.  Amputation left third toe 8.  Incision and drainage infectionleft 2nd toe amputation site. ;  Surgeon: Samara Deist, DPM;  Location    HEMATOLOGY/ONCOLOGY HISTORY:  Oncology History  Primary adenocarcinoma of left lung ([place])  11/04/2019 Initial Diagnosis   Primary adenocarcinoma of left lung ([place])   12/23/2019 Cancer Staging   Staging form: Lung, AJCC 8th Edition - Clinical: Stage IVA (cT2, cN2, pM1a) - Signed by Earlie Server, MD on 12/23/2019   01/04/2020 -  Chemotherapy   The patient had pembrolizumab (KEYTRUDA) 200 mg in sodium chloride 0.9 % 50 mL chemo infusion, 200 mg, Intravenous, Once, 3 of 6 cycles Administration: 200 mg (01/04/2020), 200 mg (02/22/2020), 200 mg (01/25/2020)  for chemotherapy treatment.      ALLERGIES:  is allergic to penicillin g.  MEDICATIONS:  Current Outpatient Medications  Medication Sig Dispense Refill  . nicotine (NICODERM CQ - DOSED IN MG/24 HOURS) 21 mg/24hr patch Place 1 patch (21 mg total) onto the skin daily. 28 patch 0  . allopurinol (ZYLOPRIM) 100 MG tablet Take 1 tablet (100 mg total) by mouth daily. 30 tablet 2  . aspirin 81 MG EC tablet Take 1 tablet (81 mg total) by mouth daily. 30 tablet 2  . atorvastatin (LIPITOR) 10 MG tablet Take 1 tablet (10 mg total) by mouth daily at 6 PM. 30 tablet 2  . calcium acetate (PHOSLO) 667 MG capsule Take 2 capsules (1,334 mg total) by mouth 3 (three) times daily with meals. 90 capsule 0  . clopidogrel (PLAVIX) 75 MG tablet Take 1 tablet (75 mg total) by mouth daily. 30 tablet 2  . diphenoxylate-atropine (LOMOTIL) 2.5-0.025 MG tablet Take 1 tablet by mouth every 12 (twelve) hours as needed for diarrhea or loose stools. (Patient not taking: Reported on 02/22/2020) 14  tablet 0  . escitalopram (LEXAPRO) 10 MG tablet Take 1 tablet (10 mg total) by mouth daily. 30 tablet 2  . ondansetron (ZOFRAN) 4 MG tablet Take 1 tablet (4 mg total) by mouth every 6 (six) hours as needed for nausea. 20 tablet 0  . oxyCODONE (OXY IR/ROXICODONE) 5 MG immediate release tablet Take 1 tablet (5 mg total) by mouth every 6 (six) hours as needed for severe pain. (Patient not taking: Reported on 01/04/2020) 10 tablet 0  . tamsulosin (FLOMAX) 0.4 MG CAPS capsule Take 1 capsule (0.4 mg total) by mouth daily. 30 capsule 2  . traMADol (ULTRAM) 50 MG tablet 50 mg. (Patient not taking: Reported on 01/04/2020)     No current facility-administered medications for this visit.    VITAL SIGNS: BP (!) 166/81   Pulse 72   Temp (!) 97.5 F (36.4 C)   Resp 18   Wt 150 lb 8 oz (68.3 kg)   BMI 19.86 kg/m  Filed Weights   04/09/20 1358  Weight: 150 lb 8 oz (68.3 kg)    Estimated body mass index is 19.86 kg/m as calculated from the following:   Height as of 02/07/20: 6\' 1"  (1.854 m).   Weight as of this encounter: 150 lb 8 oz (68.3 kg).  LABS: CBC:    Component Value Date/Time   WBC 8.0 02/22/2020 0842   HGB 11.5 (L) 02/22/2020 0842   HGB 13.6 08/18/2011 1054   HCT 34.2 (L) 02/22/2020 0842   PLT 168 02/22/2020 0842   MCV 96.1 02/22/2020 0842   NEUTROABS 5.4 02/22/2020 0842   LYMPHSABS 1.4 02/22/2020 0842   MONOABS 0.8 02/22/2020 0842   EOSABS 0.3 02/22/2020 0842   BASOSABS 0.1 02/22/2020 0842   Comprehensive Metabolic Panel:    Component Value Date/Time   NA 138 02/22/2020 0842   NA 141 08/18/2011 1054   K 3.4 (L) 02/22/2020 0842   K 4.5 08/18/2011 1054   CL 95 (L) 02/22/2020 0842   CL 108 (H) 08/18/2011 1054   CO2 31 02/22/2020 0842   CO2 23 08/18/2011 1054   BUN 44 (H) 02/22/2020 0842   BUN 25 (H) 08/18/2011 1054   CREATININE 6.00 (H) 02/22/2020 0842   CREATININE 2.10 (H) 08/18/2011 1054   GLUCOSE 98 02/22/2020 0842   GLUCOSE 119 (H) 08/18/2011 1054   CALCIUM 7.8 (L)  02/22/2020 0842   CALCIUM 9.0 08/18/2011 1054   AST 14 (L) 02/22/2020 0842   ALT 8 02/22/2020 0842   ALKPHOS 41 02/22/2020 0842   BILITOT 0.6 02/22/2020 0842   PROT 7.7 02/22/2020 0842   ALBUMIN 3.6 02/22/2020 0842    RADIOGRAPHIC STUDIES: No results found.  PERFORMANCE STATUS (ECOG) : 2 - Symptomatic, <50% confined to bed  Review of Systems Unless otherwise noted, a complete review of systems is negative.  Physical Exam General: NAD Pulmonary: Unlabored Extremities: no edema, no joint deformities Skin: no rashes Neurological: Weakness but otherwise nonfocal  IMPRESSION: Patient presented to the clinic today for follow-up visit.  Patient reports he is doing reasonably well.  He denies significant changes or concerns.  He reports stable performance status.  Oral intake is reportedly improved.  I do note that he has had some weight gain since last seen in the clinic.  He denies any distressing symptoms today.  We discussed overall goals.  Patient would be interested in continuing treatments.  He was a no-show at time of last scheduled Keytruda in October.  Case discussed with Dr. Tasia Catchings and will reschedule him to be seen for consideration of treatment next week.  PLAN: -Continue current scope of treatment -Follow next week with Dr Tasia Catchings. -Follow-up MyChart visit with me in 6 to 8 weeks   Patient expressed understanding and was in agreement with this plan. He also understands that He can call the clinic at any time with any questions, concerns, or complaints.     Time Total: 15 minutes  Visit consisted of counseling and education dealing with the complex and emotionally intense issues of symptom management and palliative care in the setting of serious and potentially life-threatening illness.Greater than 50%  of this time was spent counseling and coordinating care related to the above assessment and plan.  Signed by: [name], PhD, NP-C

## 2020-04-16 ENCOUNTER — Inpatient Hospital Stay: Payer: Medicare Other

## 2020-04-16 ENCOUNTER — Encounter: Payer: Self-pay | Admitting: Oncology

## 2020-04-16 ENCOUNTER — Inpatient Hospital Stay (HOSPITAL_BASED_OUTPATIENT_CLINIC_OR_DEPARTMENT_OTHER): Payer: Medicare Other | Admitting: Oncology

## 2020-04-16 VITALS — BP 160/84 | HR 61 | Temp 98.4°F | Resp 16 | Wt 156.6 lb

## 2020-04-16 DIAGNOSIS — C3492 Malignant neoplasm of unspecified part of left bronchus or lung: Secondary | ICD-10-CM

## 2020-04-16 DIAGNOSIS — Z5112 Encounter for antineoplastic immunotherapy: Secondary | ICD-10-CM

## 2020-04-16 DIAGNOSIS — J910 Malignant pleural effusion: Secondary | ICD-10-CM | POA: Diagnosis not present

## 2020-04-16 LAB — COMPREHENSIVE METABOLIC PANEL
ALT: 7 U/L (ref 0–44)
AST: 15 U/L (ref 15–41)
Albumin: 3.4 g/dL — ABNORMAL LOW (ref 3.5–5.0)
Alkaline Phosphatase: 32 U/L — ABNORMAL LOW (ref 38–126)
Anion gap: 11 (ref 5–15)
BUN: 54 mg/dL — ABNORMAL HIGH (ref 8–23)
CO2: 27 mmol/L (ref 22–32)
Calcium: 8 mg/dL — ABNORMAL LOW (ref 8.9–10.3)
Chloride: 99 mmol/L (ref 98–111)
Creatinine, Ser: 6.57 mg/dL — ABNORMAL HIGH (ref 0.61–1.24)
GFR, Estimated: 8 mL/min — ABNORMAL LOW (ref >60)
Glucose, Bld: 72 mg/dL (ref 70–99)
Potassium: 3.4 mmol/L — ABNORMAL LOW (ref 3.5–5.1)
Sodium: 137 mmol/L (ref 135–145)
Total Bilirubin: 0.5 mg/dL (ref 0.3–1.2)
Total Protein: 7.3 g/dL (ref 6.5–8.1)

## 2020-04-16 LAB — CBC WITH DIFFERENTIAL/PLATELET
Abs Immature Granulocytes: 0.03 10*3/uL (ref 0.00–0.07)
Basophils Absolute: 0.1 10*3/uL (ref 0.0–0.1)
Basophils Relative: 1 %
Eosinophils Absolute: 0.3 10*3/uL (ref 0.0–0.5)
Eosinophils Relative: 4 %
HCT: 32.6 % — ABNORMAL LOW (ref 39.0–52.0)
Hemoglobin: 11 g/dL — ABNORMAL LOW (ref 13.0–17.0)
Immature Granulocytes: 0 %
Lymphocytes Relative: 17 %
Lymphs Abs: 1.4 10*3/uL (ref 0.7–4.0)
MCH: 32.3 pg (ref 26.0–34.0)
MCHC: 33.7 g/dL (ref 30.0–36.0)
MCV: 95.6 fL (ref 80.0–100.0)
Monocytes Absolute: 0.8 10*3/uL (ref 0.1–1.0)
Monocytes Relative: 9 %
Neutro Abs: 5.5 10*3/uL (ref 1.7–7.7)
Neutrophils Relative %: 69 %
Platelets: 128 10*3/uL — ABNORMAL LOW (ref 150–400)
RBC: 3.41 MIL/uL — ABNORMAL LOW (ref 4.22–5.81)
RDW: 14.6 % (ref 11.5–15.5)
WBC: 8 10*3/uL (ref 4.0–10.5)
nRBC: 0 % (ref 0.0–0.2)

## 2020-04-16 LAB — TSH: TSH: 5.289 u[IU]/mL — ABNORMAL HIGH (ref 0.350–4.500)

## 2020-04-16 MED ORDER — SODIUM CHLORIDE 0.9 % IV SOLN
200.0000 mg | Freq: Once | INTRAVENOUS | Status: AC
  Administered 2020-04-16: 200 mg via INTRAVENOUS
  Filled 2020-04-16: qty 8

## 2020-04-16 MED ORDER — SODIUM CHLORIDE 0.9% FLUSH
10.0000 mL | INTRAVENOUS | Status: DC | PRN
  Filled 2020-04-16: qty 10

## 2020-04-16 MED ORDER — HEPARIN SOD (PORK) LOCK FLUSH 100 UNIT/ML IV SOLN
500.0000 [IU] | Freq: Once | INTRAVENOUS | Status: DC
  Filled 2020-04-16: qty 5

## 2020-04-16 MED ORDER — SODIUM CHLORIDE 0.9 % IV SOLN
Freq: Once | INTRAVENOUS | Status: AC
  Filled 2020-04-16: qty 250

## 2020-04-16 NOTE — Progress Notes (Signed)
1200- Patient tolerated treatment well. Patient discharged to home at this time.

## 2020-04-16 NOTE — Progress Notes (Signed)
Hematology/Oncology Follow Up Note [facility]  Telephone:([phone] [phone]  Patient Care Team: [name], MD as PCP - General (Family Medicine) Telford Nab, RN as Oncology Nurse Navigator   Name of the patient: Jose [name]  [id number]  1946-01-24   REASON FOR VISIT  follow-up for lung cancer   PERTINENT ONCOLOGY HISTORY 74 y.o. with past medical history hemodialysis ESRD, history of prostate cancer, stage IV lung cancer presents for follow-up # Patient has left pleural catheter placed, per patient he has home care nurse checking in and draining the pleural catheter.  He denies any shortness of breath at resting or with exertion. He lives at home with sister.  His nephew sometimes helps him with transportation.  Identified biggest barrier for him for follow-up visits/treatment visits is transportation.  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 Chronic findings include remote infarcts, moderate cerebral atrophy, chronic ischemic changes, Nonocclusive left V4 segment thrombus with slow flow. Moderate narrowing of the bilateral supraclinoid ICAs  INTERVAL HISTORY Jose [name] is a 74 y.o. male who has above history reviewed by me today presents for follow up visit for management of stage IV lung cancer Problems and complaints are listed below Patient no showed to his last appointment for immunotherapy treatments.  Patient was seen by palliative care service recently and request rescheduled appointment. Patient has no new complaints.  No diarrhea.  Appetite is fair.  Chronic fatigue unchanged. Reports breathing well, denies any shortness of breath Review of Systems  Constitutional: Positive for fatigue. Negative for appetite change, chills, fever and unexpected weight change.  HENT:   Negative for hearing  loss and voice change.   Eyes: Negative for eye problems and icterus.  Respiratory: Negative for chest tightness, cough and shortness of breath.   Cardiovascular: Negative for chest pain and leg swelling.  Gastrointestinal: Negative for abdominal distention, abdominal pain and diarrhea.  Endocrine: Negative for hot flashes.  Genitourinary: Negative for difficulty urinating, dysuria and frequency.   Musculoskeletal: Negative for arthralgias.  Skin: Negative for itching and rash.  Neurological: Negative for light-headedness and numbness.  Hematological: Negative for adenopathy. Does not bruise/bleed easily.  Psychiatric/Behavioral: Negative for confusion.       Allergies  Allergen Reactions  . Penicillin G Other (See Comments)    Other reaction(s): Unknown DID THE REACTION INVOLVE: Swelling of the face/tongue/throat, SOB, or low BP? Unknown Sudden or severe rash/hives, skin peeling, or the inside of the mouth or nose? Unknown Did it require medical treatment? Unknown When did it last happen?unknown If all above answers are "NO", may proceed with cephalosporin use.      Past Medical History:  Diagnosis Date  . Chronic kidney disease   . Depression   . Diabetes mellitus without complication ([place])   . Glaucoma   . Glaucoma   . Gout   . Heart murmur   . HLD (hyperlipidemia)   . Hypertension   . Primary adenocarcinoma of left lung ([place])   . Prostate cancer ([place])   . Renal failure    dialysis t/t/s  . Stroke ([place])    tia x 2     Past Surgical History:  Procedure Laterality Date  . AMPUTATION TOE Bilateral 07/23/2018   Procedure: TOE MPJ RIGHT 3RD AND LEFT 2ND;  Surgeon: Samara Deist, DPM;  Location: [facility];  Service: Podiatry;  Laterality: Bilateral;  . APPENDECTOMY    . AV FISTULA PLACEMENT Left 06/09/2018   Procedure: ARTERIOVENOUS (AV) FISTULA CREATION ( BRACHIAL CEPHALIC);  Surgeon: Algernon Huxley, MD;  Location: [facility];  Service: Vascular;  Laterality: Left;  .  CHOLECYSTECTOMY    . DIALYSIS/PERMA CATHETER INSERTION N/A 03/24/2018   Procedure: DIALYSIS/PERMA CATHETER INSERTION;  Surgeon: Katha Cabal, MD;  Location: [place] [facility];  Service: Cardiovascular;  Laterality: N/A;  . DIALYSIS/PERMA CATHETER REMOVAL N/A 09/06/2018   Procedure: DIALYSIS/PERMA CATHETER REMOVAL;  Surgeon: Algernon Huxley, MD;  Location: [place] [facility];  Service: Cardiovascular;  Laterality: N/A;  . INSERTION PROSTATE RADIATION SEED    . IR PERC PLEURAL DRAIN W/INDWELL CATH W/IMG GUIDE  11/03/2019  . IRRIGATION AND DEBRIDEMENT FOOT Right 12/17/2018   Procedure: IRRIGATION AND DEBRIDEMENT FOOT;  Surgeon: Samara Deist, DPM;  Location: [facility];  Service: Podiatry;  Laterality: Right;  . LOWER EXTREMITY ANGIOGRAPHY Right 06/16/2018   Procedure: LOWER EXTREMITY ANGIOGRAPHY;  Surgeon: Algernon Huxley, MD;  Location: [place] [facility];  Service: Cardiovascular;  Laterality: Right;  . LOWER EXTREMITY ANGIOGRAPHY Left 08/30/2018   Procedure: LOWER EXTREMITY ANGIOGRAPHY;  Surgeon: Algernon Huxley, MD;  Location: [place] [facility];  Service: Cardiovascular;  Laterality: Left;  . LOWER EXTREMITY ANGIOGRAPHY Right 09/30/2018   Procedure: LOWER EXTREMITY ANGIOGRAPHY;  Surgeon: Algernon Huxley, MD;  Location: [place] [facility];  Service: Cardiovascular;  Laterality: Right;  . LOWER EXTREMITY ANGIOGRAPHY Left 10/07/2018   Procedure: LOWER EXTREMITY ANGIOGRAPHY;  Surgeon: Algernon Huxley, MD;  Location: [place] [facility];  Service: Cardiovascular;  Laterality: Left;  . LOWER EXTREMITY ANGIOGRAPHY Right 12/20/2018   Procedure: Lower Extremity Angiography;  Surgeon: Algernon Huxley, MD;  Location: [place] [facility];  Service: Cardiovascular;  Laterality: Right;  . TRANSMETATARSAL AMPUTATION Bilateral 10/13/2018   Procedure: 1.  Amputation right great toe MTPJ 2.  Amputation right second toe MTPJ 3.  Amputation right fourth toe MTPJ 4.  Amputation right fifth toe MTPJ 5.  Excision distal second  metatarsal head right foot 6.  Excision distal third metatarsal head right foot 7.  Amputation left third toe 8.  Incision and drainage infectionleft 2nd toe amputation site. ;  Surgeon: Samara Deist, DPM;  Location    Social History   Socioeconomic History  . Marital status: Widowed    Spouse name: Not on file  . Number of children: Not on file  . Years of education: Not on file  . Highest education level: Not on file  Occupational History    Employer: RETIRED  Tobacco Use  . Smoking status: Current Some Day Smoker    Packs/day: 0.10    Types: Cigarettes  . Smokeless tobacco: Never Used  . Tobacco comment: pt currently smokes about 3 cigarettes a day   Vaping Use  . Vaping Use: Never used  Substance and Sexual Activity  . Alcohol use: Not Currently    Alcohol/week: 0.0 standard drinks  . Drug use: Never  . Sexual activity: Not Currently  Other Topics Concern  . Not on file  Social History Narrative  . Not on file   Social Determinants of Health   Financial Resource Strain:   . Difficulty of Paying Living Expenses: Not on file  Food Insecurity:   . Worried About Charity fundraiser in the Last Year: Not on file  . Ran Out of Food in the Last Year: Not on file  Transportation Needs:   .  Lack of Transportation (Medical): Not on file  . Lack of Transportation (Non-Medical): Not on file  Physical Activity:   . Days of Exercise per Week: Not on file  . Minutes of Exercise per Session: Not on file  Stress:   . Feeling of Stress : Not on file  Social Connections:   . Frequency of Communication with Friends and Family: Not on file  . Frequency of Social Gatherings with Friends and Family: Not on file  . Attends Religious Services: Not on file  . Active Member of Clubs or Organizations: Not on file  . Attends Archivist Meetings: Not on file  . Marital Status: Not on file  Intimate Partner Violence:   . Fear of Current or Ex-Partner: Not on file  .  Emotionally Abused: Not on file  . Physically Abused: Not on file  . Sexually Abused: Not on file    Family History  Problem Relation Age of Onset  . Varicose Veins Neg Hx      Current Outpatient Medications:  .  allopurinol (ZYLOPRIM) 100 MG tablet, Take 1 tablet (100 mg total) by mouth daily., Disp: 30 tablet, Rfl: 2 .  amLODipine (NORVASC) 5 MG tablet, Take 10 mg by mouth daily., Disp: , Rfl:  .  aspirin 81 MG EC tablet, Take 1 tablet (81 mg total) by mouth daily., Disp: 30 tablet, Rfl: 2 .  atorvastatin (LIPITOR) 10 MG tablet, Take 1 tablet (10 mg total) by mouth daily at 6 PM., Disp: 30 tablet, Rfl: 2 .  clopidogrel (PLAVIX) 75 MG tablet, Take 1 tablet (75 mg total) by mouth daily., Disp: 30 tablet, Rfl: 2 .  escitalopram (LEXAPRO) 10 MG tablet, Take 1 tablet (10 mg total) by mouth daily., Disp: 30 tablet, Rfl: 2 .  gabapentin (NEURONTIN) 300 MG capsule, Take 300 mg by mouth 3 (three) times daily., Disp: , Rfl:  .  glipiZIDE (GLUCOTROL) 5 MG tablet, Take by mouth daily before breakfast., Disp: , Rfl:  .  nicotine (NICODERM CQ - DOSED IN MG/24 HOURS) 21 mg/24hr patch, Place 1 patch (21 mg total) onto the skin daily., Disp: 28 patch, Rfl: 0 .  ondansetron (ZOFRAN) 4 MG tablet, Take 1 tablet (4 mg total) by mouth every 6 (six) hours as needed for nausea., Disp: 20 tablet, Rfl: 0 .  tamsulosin (FLOMAX) 0.4 MG CAPS capsule, Take 1 capsule (0.4 mg total) by mouth daily., Disp: 30 capsule, Rfl: 2 .  calcium acetate (PHOSLO) 667 MG capsule, Take 2 capsules (1,334 mg total) by mouth 3 (three) times daily with meals. (Patient not taking: Reported on 04/16/2020), Disp: 90 capsule, Rfl: 0 .  diphenoxylate-atropine (LOMOTIL) 2.5-0.025 MG tablet, Take 1 tablet by mouth every 12 (twelve) hours as needed for diarrhea or loose stools. (Patient not taking: Reported on 02/22/2020), Disp: 14 tablet, Rfl: 0 .  oxyCODONE (OXY IR/ROXICODONE) 5 MG immediate release tablet, Take 1 tablet (5 mg total) by mouth  every 6 (six) hours as needed for severe pain. (Patient not taking: Reported on 01/04/2020), Disp: 10 tablet, Rfl: 0 .  traMADol (ULTRAM) 50 MG tablet, 50 mg. (Patient not taking: Reported on 01/04/2020), Disp: , Rfl:   Physical exam:  Vitals:   04/16/20 0935  BP: (!) 160/84  Pulse: 61  Resp: 16  Temp: 98.4 F (36.9 C)  TempSrc: Tympanic  SpO2: 99%  Weight: 156 lb 9.6 oz (71 kg)   Physical Exam Constitutional:      General: He is not in acute  distress.    Appearance: He is not diaphoretic.     Comments: Patient sits in the wheelchair  HENT:     Head: Normocephalic and atraumatic.     Nose: Nose normal.     Mouth/Throat:     Pharynx: No oropharyngeal exudate.  Eyes:     General: No scleral icterus.    Pupils: Pupils are equal, round, and reactive to light.  Cardiovascular:     Rate and Rhythm: Normal rate and regular rhythm.     Heart sounds: No murmur heard.   Pulmonary:     Effort: Pulmonary effort is normal. No respiratory distress.     Breath sounds: No rales.     Comments: Severely decreased breath sound bilaterally.  Left worse than right Chest:     Chest wall: No tenderness.  Abdominal:     General: There is no distension.     Palpations: Abdomen is soft.     Tenderness: There is no abdominal tenderness.  Musculoskeletal:        General: Normal range of motion.     Cervical back: Normal range of motion and neck supple.  Skin:    General: Skin is warm and dry.     Findings: No erythema.  Neurological:     Mental Status: He is alert and oriented to person, place, and time.     Cranial Nerves: No cranial nerve deficit.     Motor: No abnormal muscle tone.     Coordination: Coordination normal.  Psychiatric:        Mood and Affect: Affect normal.       CMP Latest Ref Rng & Units 04/16/2020  Glucose 70 - 99 mg/dL 72  BUN 8 - 23 mg/dL 34(H)  Creatinine 9.62 - 1.24 mg/dL 2.29(N)  Sodium 989 - 211 mmol/L 137  Potassium 3.5 - 5.1 mmol/L 3.4(L)  Chloride 98 -  111 mmol/L 99  CO2 22 - 32 mmol/L 27  Calcium 8.9 - 10.3 mg/dL 8.0(L)  Total Protein 6.5 - 8.1 g/dL 7.3  Total Bilirubin 0.3 - 1.2 mg/dL 0.5  Alkaline Phos 38 - 126 U/L 32(L)  AST 15 - 41 U/L 15  ALT 0 - 44 U/L 7   CBC Latest Ref Rng & Units 04/16/2020  WBC 4.0 - 10.5 K/uL 8.0  Hemoglobin 13.0 - 17.0 g/dL 11.0(L)  Hematocrit 39 - 52 % 32.6(L)  Platelets 150 - 400 K/uL 128(L)    RADIOGRAPHIC STUDIES: I have personally reviewed the radiological images as listed and agreed with the findings in the report. No results found.   Assessment and plan  1. Primary adenocarcinoma of left lung (HCC)   2. Encounter for antineoplastic immunotherapy   3. Malignant pleural effusion    Stage IV lung adenocarcinoma with malignant pleural effusion Status post 3 cycle of immunotherapy Keytruda Labs reviewed and discussed with patient Proceed with cycle 4 Keytruda. I will obtain CT chest abdomen pelvis to assess treatment response prior to the next visit.  #Malignant pleural effusion,  pleural catheter has been discontinued due to minimal output.  Clinically he is doing well with no need for repeat thoracentesis.  #ESRD on hemodialysis,   potassium supplementation due to ESRD. #Anemia, hemoglobin 11, likely due to chronic kidney disease.  No iron deficiency, adequate folate.  Norma B12. #Weight has improved.  Follow-up 3 week  for evaluation of diarrhea and Keytruda.   Rickard Patience, MD, PhD Hematology Oncology [facility] at [facility] Pager- [phone]  04/16/2020

## 2020-04-16 NOTE — Addendum Note (Signed)
Addended by: Evelina Dun on: 04/16/2020 04:25 PM   Modules accepted: Orders

## 2020-04-16 NOTE — Progress Notes (Signed)
Patient here for follow up. No new concerns voiced. Med rec was done with medication bottles that pt brought in.

## 2020-04-17 ENCOUNTER — Telehealth: Payer: Self-pay | Admitting: Nurse Practitioner

## 2020-04-17 ENCOUNTER — Other Ambulatory Visit: Payer: Self-pay

## 2020-04-17 DIAGNOSIS — C3492 Malignant neoplasm of unspecified part of left bronchus or lung: Secondary | ICD-10-CM

## 2020-04-17 LAB — T4, FREE: Free T4: 0.96 ng/dL (ref 0.61–1.12)

## 2020-04-17 NOTE — Telephone Encounter (Signed)
Spoke with sister, [name], asking her if we could reschedule the 04/23/20 Palliative f/u visit to 05/07/20 @ 3:30 PM and she was in agreement with this.

## 2020-04-23 ENCOUNTER — Other Ambulatory Visit: Payer: Medicare Other | Admitting: Nurse Practitioner

## 2020-04-30 ENCOUNTER — Other Ambulatory Visit: Payer: Self-pay

## 2020-04-30 ENCOUNTER — Ambulatory Visit
Admission: RE | Admit: 2020-04-30 | Discharge: 2020-04-30 | Disposition: A | Discharge status: Home / Self Care | Payer: Medicare Other | Source: Ambulatory Visit | Attending: Oncology | Admitting: Oncology

## 2020-04-30 DIAGNOSIS — C3492 Malignant neoplasm of unspecified part of left bronchus or lung: Secondary | ICD-10-CM | POA: Diagnosis present

## 2020-04-30 MED ORDER — IOHEXOL 300 MG/ML  SOLN
75.0000 mL | Freq: Once | INTRAMUSCULAR | Status: AC | PRN
  Administered 2020-04-30: 85 mL via INTRAVENOUS

## 2020-05-01 ENCOUNTER — Telehealth: Payer: Self-pay

## 2020-05-01 NOTE — Telephone Encounter (Signed)
Dr. Tasia Catchings would like to discussion change in plan with patient.  He is scheduled for lab/MD/Keytruda 05/07/20.  Please move the lab/MD appt up to this week and cx Keytruda.

## 2020-05-07 ENCOUNTER — Inpatient Hospital Stay: Payer: Medicare Other | Attending: Oncology

## 2020-05-07 ENCOUNTER — Ambulatory Visit: Payer: Medicare Other

## 2020-05-07 ENCOUNTER — Other Ambulatory Visit: Payer: Self-pay

## 2020-05-07 ENCOUNTER — Inpatient Hospital Stay: Payer: Medicare Other

## 2020-05-07 ENCOUNTER — Encounter: Payer: Self-pay | Admitting: Oncology

## 2020-05-07 ENCOUNTER — Telehealth: Payer: Self-pay | Admitting: Nurse Practitioner

## 2020-05-07 ENCOUNTER — Other Ambulatory Visit: Payer: Medicare Other | Admitting: Nurse Practitioner

## 2020-05-07 ENCOUNTER — Inpatient Hospital Stay (HOSPITAL_BASED_OUTPATIENT_CLINIC_OR_DEPARTMENT_OTHER): Payer: Medicare Other | Admitting: Oncology

## 2020-05-07 VITALS — BP 161/88 | HR 76 | Temp 98.7°F | Resp 18 | Wt 156.0 lb

## 2020-05-07 DIAGNOSIS — Z5112 Encounter for antineoplastic immunotherapy: Secondary | ICD-10-CM | POA: Diagnosis present

## 2020-05-07 DIAGNOSIS — E119 Type 2 diabetes mellitus without complications: Secondary | ICD-10-CM | POA: Diagnosis not present

## 2020-05-07 DIAGNOSIS — Z8546 Personal history of malignant neoplasm of prostate: Secondary | ICD-10-CM | POA: Insufficient documentation

## 2020-05-07 DIAGNOSIS — N186 End stage renal disease: Secondary | ICD-10-CM | POA: Diagnosis not present

## 2020-05-07 DIAGNOSIS — Z79899 Other long term (current) drug therapy: Secondary | ICD-10-CM | POA: Insufficient documentation

## 2020-05-07 DIAGNOSIS — Z7982 Long term (current) use of aspirin: Secondary | ICD-10-CM | POA: Insufficient documentation

## 2020-05-07 DIAGNOSIS — E785 Hyperlipidemia, unspecified: Secondary | ICD-10-CM | POA: Insufficient documentation

## 2020-05-07 DIAGNOSIS — C3492 Malignant neoplasm of unspecified part of left bronchus or lung: Secondary | ICD-10-CM | POA: Diagnosis not present

## 2020-05-07 DIAGNOSIS — Z8673 Personal history of transient ischemic attack (TIA), and cerebral infarction without residual deficits: Secondary | ICD-10-CM | POA: Insufficient documentation

## 2020-05-07 DIAGNOSIS — F32A Depression, unspecified: Secondary | ICD-10-CM | POA: Diagnosis not present

## 2020-05-07 DIAGNOSIS — J910 Malignant pleural effusion: Secondary | ICD-10-CM

## 2020-05-07 DIAGNOSIS — Z992 Dependence on renal dialysis: Secondary | ICD-10-CM | POA: Diagnosis not present

## 2020-05-07 DIAGNOSIS — F17210 Nicotine dependence, cigarettes, uncomplicated: Secondary | ICD-10-CM | POA: Insufficient documentation

## 2020-05-07 DIAGNOSIS — C3412 Malignant neoplasm of upper lobe, left bronchus or lung: Secondary | ICD-10-CM | POA: Diagnosis present

## 2020-05-07 DIAGNOSIS — Z7984 Long term (current) use of oral hypoglycemic drugs: Secondary | ICD-10-CM | POA: Insufficient documentation

## 2020-05-07 DIAGNOSIS — Z7189 Other specified counseling: Secondary | ICD-10-CM

## 2020-05-07 DIAGNOSIS — I10 Essential (primary) hypertension: Secondary | ICD-10-CM | POA: Diagnosis not present

## 2020-05-07 LAB — COMPREHENSIVE METABOLIC PANEL
ALT: 7 U/L (ref 0–44)
AST: 18 U/L (ref 15–41)
Albumin: 3.5 g/dL (ref 3.5–5.0)
Alkaline Phosphatase: 39 U/L (ref 38–126)
Anion gap: 13 (ref 5–15)
BUN: 33 mg/dL — ABNORMAL HIGH (ref 8–23)
CO2: 32 mmol/L (ref 22–32)
Calcium: 7.9 mg/dL — ABNORMAL LOW (ref 8.9–10.3)
Chloride: 92 mmol/L — ABNORMAL LOW (ref 98–111)
Creatinine, Ser: 7.07 mg/dL — ABNORMAL HIGH (ref 0.61–1.24)
GFR, Estimated: 8 mL/min — ABNORMAL LOW (ref >60)
Glucose, Bld: 117 mg/dL — ABNORMAL HIGH (ref 70–99)
Potassium: 3 mmol/L — ABNORMAL LOW (ref 3.5–5.1)
Sodium: 137 mmol/L (ref 135–145)
Total Bilirubin: 0.3 mg/dL (ref 0.3–1.2)
Total Protein: 7.1 g/dL (ref 6.5–8.1)

## 2020-05-07 LAB — CBC WITH DIFFERENTIAL/PLATELET
Abs Immature Granulocytes: 0.04 10*3/uL (ref 0.00–0.07)
Basophils Absolute: 0.1 10*3/uL (ref 0.0–0.1)
Basophils Relative: 1 %
Eosinophils Absolute: 0.5 10*3/uL (ref 0.0–0.5)
Eosinophils Relative: 6 %
HCT: 33.4 % — ABNORMAL LOW (ref 39.0–52.0)
Hemoglobin: 10.9 g/dL — ABNORMAL LOW (ref 13.0–17.0)
Immature Granulocytes: 1 %
Lymphocytes Relative: 23 %
Lymphs Abs: 1.9 10*3/uL (ref 0.7–4.0)
MCH: 32.3 pg (ref 26.0–34.0)
MCHC: 32.6 g/dL (ref 30.0–36.0)
MCV: 99.1 fL (ref 80.0–100.0)
Monocytes Absolute: 0.8 10*3/uL (ref 0.1–1.0)
Monocytes Relative: 9 %
Neutro Abs: 5.1 10*3/uL (ref 1.7–7.7)
Neutrophils Relative %: 60 %
Platelets: 155 10*3/uL (ref 150–400)
RBC: 3.37 MIL/uL — ABNORMAL LOW (ref 4.22–5.81)
RDW: 14.5 % (ref 11.5–15.5)
WBC: 8.3 10*3/uL (ref 4.0–10.5)
nRBC: 0 % (ref 0.0–0.2)

## 2020-05-07 NOTE — Progress Notes (Signed)
Nutrition Follow-up:  Patient with lung cancer followed by Dr. Tasia Catchings.  Met with patient in clinic following MD appointment.  Patient reports that appetite is about the same.  Reports that yesterday he was able to eat some chicken, potatoes and green beans.  Does not always drink shake at dialysis (Tue, [place], Sat) and is not able to eat the protein bar as it has nuts in it.      Medications: reviewed  Labs: reviewed  Anthropometrics:   Weight 156 lb today increased from 150 lb on 11/8.     NUTRITION DIAGNOSIS: Unintentional weight loss improved   INTERVENTION:  Recommend patient drink shake each day at dialysis for additional calories and protein.  Patient verbalized that he will.  Continue to eat high calorie, high protein foods    MONITORING, EVALUATION, GOAL: weight trends, intake   NEXT VISIT: Jan 3rd, f/u in clinic  [place]. Zenia Resides, [place], [place] Registered Dietitian [phone] (mobile)

## 2020-05-07 NOTE — Progress Notes (Signed)
Patient denies new problems/concerns today.   °

## 2020-05-07 NOTE — Telephone Encounter (Signed)
I attempted to call Jose [name] for PC f/u visit prior to scheduled appointment today to confirm time. No answer, messages left on home phone x 2 and unable to leave a message on cell phone. No return call, will continue to attempt to call to reschedule

## 2020-05-07 NOTE — Progress Notes (Signed)
Hematology/Oncology Follow Up Note [facility]  [phone] [phone]  Patient Care Team: [name], MD as PCP - General (Family Medicine) Telford Nab, RN as Oncology Nurse Navigator   Name of the patient: Jose [name]  [id number]  03-18-1946   REASON FOR VISIT  follow-up for lung cancer   PERTINENT ONCOLOGY HISTORY 74 y.o. with past medical history hemodialysis ESRD, history of prostate cancer, stage IV lung cancer presents for follow-up # Patient has left pleural catheter placed, per patient he has home care nurse checking in and draining the pleural catheter.  He denies any shortness of breath at resting or with exertion. He lives at home with sister.  His nephew sometimes helps him with transportation.  Identified biggest barrier for him for follow-up visits/treatment visits is transportation.  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 Chronic findings include remote infarcts, moderate cerebral atrophy, chronic ischemic changes, Nonocclusive left V4 segment thrombus with slow flow. Moderate narrowing of the bilateral supraclinoid ICAs  INTERVAL HISTORY Jose [name] is a 74 y.o. male who has above history reviewed by me today presents for follow up visit for management of stage IV lung cancer Problems and complaints are listed below He presents to discuss about the CT scan and management plan. No new complaints.  No shortness of breath.   Review of Systems  Constitutional: Positive for fatigue. Negative for appetite change, chills, fever and unexpected weight change.  HENT:   Negative for hearing loss and voice change.   Eyes: Negative for eye problems and icterus.  Respiratory: Negative for chest tightness, cough and shortness of breath.   Cardiovascular: Negative for chest pain and leg  swelling.  Gastrointestinal: Negative for abdominal distention, abdominal pain and diarrhea.  Endocrine: Negative for hot flashes.  Genitourinary: Negative for difficulty urinating, dysuria and frequency.   Musculoskeletal: Negative for arthralgias.  Skin: Negative for itching and rash.  Neurological: Negative for light-headedness and numbness.  Hematological: Negative for adenopathy. Does not bruise/bleed easily.  Psychiatric/Behavioral: Negative for confusion.       Allergies  Allergen Reactions  . Penicillin G Other (See Comments)    Other reaction(s): Unknown DID THE REACTION INVOLVE: Swelling of the face/tongue/throat, SOB, or low BP? Unknown Sudden or severe rash/hives, skin peeling, or the inside of the mouth or nose? Unknown Did it require medical treatment? Unknown When did it last happen?unknown If all above answers are "NO", may proceed with cephalosporin use.      Past Medical History:  Diagnosis Date  . Chronic kidney disease   . Depression   . Diabetes mellitus without complication ([place])   . Glaucoma   . Glaucoma   . Gout   . Heart murmur   . HLD (hyperlipidemia)   . Hypertension   . Primary adenocarcinoma of left lung ([place])   . Prostate cancer ([place])   . Renal failure    dialysis t/t/s  . Stroke ([place])    tia x 2     Past Surgical History:  Procedure Laterality Date  . AMPUTATION TOE Bilateral 07/23/2018   Procedure: TOE MPJ RIGHT 3RD AND LEFT 2ND;  Surgeon: Samara Deist, DPM;  Location: [facility];  Service: Podiatry;  Laterality: Bilateral;  . APPENDECTOMY    . AV FISTULA PLACEMENT Left 06/09/2018   Procedure: ARTERIOVENOUS (AV) FISTULA CREATION ( BRACHIAL CEPHALIC);  Surgeon: Algernon Huxley, MD;  Location: [facility];  Service: Vascular;  Laterality: Left;  . CHOLECYSTECTOMY    . DIALYSIS/PERMA CATHETER INSERTION N/A 03/24/2018   Procedure: DIALYSIS/PERMA CATHETER INSERTION;  Surgeon: Katha Cabal, MD;  Location: [place] [facility];  Service:  Cardiovascular;  Laterality: N/A;  . DIALYSIS/PERMA CATHETER REMOVAL N/A 09/06/2018   Procedure: DIALYSIS/PERMA CATHETER REMOVAL;  Surgeon: Algernon Huxley, MD;  Location: [place] [facility];  Service: Cardiovascular;  Laterality: N/A;  . INSERTION PROSTATE RADIATION SEED    . IR PERC PLEURAL DRAIN W/INDWELL CATH W/IMG GUIDE  11/03/2019  . IRRIGATION AND DEBRIDEMENT FOOT Right 12/17/2018   Procedure: IRRIGATION AND DEBRIDEMENT FOOT;  Surgeon: Samara Deist, DPM;  Location: [facility];  Service: Podiatry;  Laterality: Right;  . LOWER EXTREMITY ANGIOGRAPHY Right 06/16/2018   Procedure: LOWER EXTREMITY ANGIOGRAPHY;  Surgeon: Algernon Huxley, MD;  Location: [place] [facility];  Service: Cardiovascular;  Laterality: Right;  . LOWER EXTREMITY ANGIOGRAPHY Left 08/30/2018   Procedure: LOWER EXTREMITY ANGIOGRAPHY;  Surgeon: Algernon Huxley, MD;  Location: [place] [facility];  Service: Cardiovascular;  Laterality: Left;  . LOWER EXTREMITY ANGIOGRAPHY Right 09/30/2018   Procedure: LOWER EXTREMITY ANGIOGRAPHY;  Surgeon: Algernon Huxley, MD;  Location: [place] [facility];  Service: Cardiovascular;  Laterality: Right;  . LOWER EXTREMITY ANGIOGRAPHY Left 10/07/2018   Procedure: LOWER EXTREMITY ANGIOGRAPHY;  Surgeon: Algernon Huxley, MD;  Location: [place] [facility];  Service: Cardiovascular;  Laterality: Left;  . LOWER EXTREMITY ANGIOGRAPHY Right 12/20/2018   Procedure: Lower Extremity Angiography;  Surgeon: Algernon Huxley, MD;  Location: [place] [facility];  Service: Cardiovascular;  Laterality: Right;  . TRANSMETATARSAL AMPUTATION Bilateral 10/13/2018   Procedure: 1.  Amputation right great toe MTPJ 2.  Amputation right second toe MTPJ 3.  Amputation right fourth toe MTPJ 4.  Amputation right fifth toe MTPJ 5.  Excision distal second metatarsal head right foot 6.  Excision distal third metatarsal head right foot 7.  Amputation left third toe 8.  Incision and drainage infectionleft 2nd toe amputation site. ;  Surgeon: Samara Deist, DPM;  Location    Social History   Socioeconomic History  . Marital status: Widowed    Spouse name: Not on file  . Number of children: Not on file  . Years of education: Not on file  . Highest education level: Not on file  Occupational History    Employer: RETIRED  Tobacco Use  . Smoking status: Current Some Day Smoker    Packs/day: 0.10    Types: Cigarettes  . Smokeless tobacco: Never Used  . Tobacco comment: pt currently smokes about 3 cigarettes a day   Vaping Use  . Vaping Use: Never used  Substance and Sexual Activity  . Alcohol use: Not Currently    Alcohol/week: 0.0 standard drinks  . Drug use: Never  . Sexual activity: Not Currently  Other Topics Concern  . Not on file  Social History Narrative  . Not on file   Social Determinants of Health   Financial Resource Strain:   . Difficulty of Paying Living Expenses: Not on file  Food Insecurity:   . Worried About Charity fundraiser in the Last Year: Not on file  . Ran Out of Food in the Last Year: Not on file  Transportation Needs:   . Lack of Transportation (Medical): Not on file  . Lack of Transportation (Non-Medical): Not on file  Physical Activity:   .  Days of Exercise per Week: Not on file  . Minutes of Exercise per Session: Not on file  Stress:   . Feeling of Stress : Not on file  Social Connections:   . Frequency of Communication with Friends and Family: Not on file  . Frequency of Social Gatherings with Friends and Family: Not on file  . Attends Religious Services: Not on file  . Active Member of Clubs or Organizations: Not on file  . Attends Archivist Meetings: Not on file  . Marital Status: Not on file  Intimate Partner Violence:   . Fear of Current or Ex-Partner: Not on file  . Emotionally Abused: Not on file  . Physically Abused: Not on file  . Sexually Abused: Not on file    Family History  Problem Relation Age of Onset  . Varicose Veins Neg Hx      Current  Outpatient Medications:  .  allopurinol (ZYLOPRIM) 100 MG tablet, Take 1 tablet (100 mg total) by mouth daily., Disp: 30 tablet, Rfl: 2 .  amLODipine (NORVASC) 5 MG tablet, Take 10 mg by mouth daily., Disp: , Rfl:  .  aspirin 81 MG EC tablet, Take 1 tablet (81 mg total) by mouth daily., Disp: 30 tablet, Rfl: 2 .  atorvastatin (LIPITOR) 10 MG tablet, Take 1 tablet (10 mg total) by mouth daily at 6 PM., Disp: 30 tablet, Rfl: 2 .  clopidogrel (PLAVIX) 75 MG tablet, Take 1 tablet (75 mg total) by mouth daily., Disp: 30 tablet, Rfl: 2 .  escitalopram (LEXAPRO) 10 MG tablet, Take 1 tablet (10 mg total) by mouth daily., Disp: 30 tablet, Rfl: 2 .  gabapentin (NEURONTIN) 300 MG capsule, Take 300 mg by mouth 3 (three) times daily., Disp: , Rfl:  .  glipiZIDE (GLUCOTROL) 5 MG tablet, Take by mouth daily before breakfast., Disp: , Rfl:  .  nicotine (NICODERM CQ - DOSED IN MG/24 HOURS) 21 mg/24hr patch, Place 1 patch (21 mg total) onto the skin daily., Disp: 28 patch, Rfl: 0 .  ondansetron (ZOFRAN) 4 MG tablet, Take 1 tablet (4 mg total) by mouth every 6 (six) hours as needed for nausea., Disp: 20 tablet, Rfl: 0 .  tamsulosin (FLOMAX) 0.4 MG CAPS capsule, Take 1 capsule (0.4 mg total) by mouth daily., Disp: 30 capsule, Rfl: 2 .  calcium acetate (PHOSLO) 667 MG capsule, Take 2 capsules (1,334 mg total) by mouth 3 (three) times daily with meals. (Patient not taking: Reported on 04/16/2020), Disp: 90 capsule, Rfl: 0 .  diphenoxylate-atropine (LOMOTIL) 2.5-0.025 MG tablet, Take 1 tablet by mouth every 12 (twelve) hours as needed for diarrhea or loose stools. (Patient not taking: Reported on 02/22/2020), Disp: 14 tablet, Rfl: 0 .  oxyCODONE (OXY IR/ROXICODONE) 5 MG immediate release tablet, Take 1 tablet (5 mg total) by mouth every 6 (six) hours as needed for severe pain. (Patient not taking: Reported on 01/04/2020), Disp: 10 tablet, Rfl: 0 .  traMADol (ULTRAM) 50 MG tablet, 50 mg. (Patient not taking: Reported on  01/04/2020), Disp: , Rfl:   Physical exam:  Vitals:   05/07/20 1258  BP: (!) 161/88  Pulse: 76  Resp: 18  Temp: 98.7 F (37.1 C)  Weight: 156 lb (70.8 kg)   Physical Exam Constitutional:      General: He is not in acute distress.    Appearance: He is not diaphoretic.     Comments: Patient sits in the wheelchair  HENT:     Head: Normocephalic and atraumatic.  Nose: Nose normal.     Mouth/Throat:     Pharynx: No oropharyngeal exudate.  Eyes:     General: No scleral icterus.    Pupils: Pupils are equal, round, and reactive to light.  Cardiovascular:     Rate and Rhythm: Normal rate and regular rhythm.     Heart sounds: No murmur heard.   Pulmonary:     Effort: Pulmonary effort is normal. No respiratory distress.     Breath sounds: No rales.     Comments: Severely decreased breath sound bilaterally.  Left worse than right Chest:     Chest wall: No tenderness.  Abdominal:     General: There is no distension.     Palpations: Abdomen is soft.     Tenderness: There is no abdominal tenderness.  Musculoskeletal:        General: Normal range of motion.     Cervical back: Normal range of motion and neck supple.  Skin:    General: Skin is warm and dry.     Findings: No erythema.  Neurological:     Mental Status: He is alert and oriented to person, place, and time.     Cranial Nerves: No cranial nerve deficit.     Motor: No abnormal muscle tone.     Coordination: Coordination normal.  Psychiatric:        Mood and Affect: Affect normal.       CMP Latest Ref Rng & Units 05/07/2020  Glucose 70 - 99 mg/dL 117(H)  BUN 8 - 23 mg/dL 33(H)  Creatinine 0.61 - 1.24 mg/dL 7.07(H)  Sodium 135 - 145 mmol/L 137  Potassium 3.5 - 5.1 mmol/L 3.0(L)  Chloride 98 - 111 mmol/L 92(L)  CO2 22 - 32 mmol/L 32  Calcium 8.9 - 10.3 mg/dL 7.9(L)  Total Protein 6.5 - 8.1 g/dL 7.1  Total Bilirubin 0.3 - 1.2 mg/dL 0.3  Alkaline Phos 38 - 126 U/L 39  AST 15 - 41 U/L 18  ALT 0 - 44 U/L 7    CBC Latest Ref Rng & Units 05/07/2020  WBC 4.0 - 10.5 K/uL 8.3  Hemoglobin 13.0 - 17.0 g/dL 10.9(L)  Hematocrit 39 - 52 % 33.4(L)  Platelets 150 - 400 K/uL 155    RADIOGRAPHIC STUDIES: I have personally reviewed the radiological images as listed and agreed with the findings in the report. CT CHEST ABDOMEN PELVIS W CONTRAST  Result Date: 04/30/2020 CLINICAL DATA:  Lung cancer.  Current smoker. EXAM: CT CHEST, ABDOMEN, AND PELVIS WITH CONTRAST TECHNIQUE: Multidetector CT imaging of the chest, abdomen and pelvis was performed following the standard protocol during bolus administration of intravenous contrast. CONTRAST:  64m OMNIPAQUE IOHEXOL 300 MG/ML  SOLN COMPARISON:  PET 01/18/2020 and CT chest abdomen pelvis 10/30/2019. FINDINGS: CT CHEST FINDINGS Cardiovascular: Atherosclerotic calcification of the aorta and coronary arteries. Pulmonic trunk and heart are enlarged. Left ventricle is dilated. Small amount of pericardial fluid is likely physiologic. Mediastinum/Nodes: High right paratracheal lymph node has enlarged, measuring 13 mm (2/20), compared to 7 mm on 01/18/2020. Similarly, a low right paratracheal lymph node has enlarged, measuring 1.8 cm (2/27), compared to 12 mm previously. Hilar lymph nodes measure up to 13 mm on the right. Comparison with 01/18/2020 is limited due to lack of IV contrast on that study. Subcarinal lymph nodes have enlarged slightly, now measuring up to 1.3 cm (2/33), compared to 8 mm on 01/18/2020. Prepericardiac lymph node measures 1.5 cm. No axillary adenopathy. Esophagus is grossly unremarkable. Lungs/Pleura: Centrilobular  and paraseptal emphysema. Spiculated apical left upper lobe nodule is more solid in appearance and slightly larger, now measuring 1.9 x 2.4 cm (3/36), compared to 1.7 x 2.1 cm on 01/18/2020. Calcified posterior right upper lobe granuloma. Dependent atelectasis in both lower lobes. Trace right and small left pleural effusions. Airway is unremarkable.  Musculoskeletal: Degenerative changes in the spine. No worrisome lytic or sclerotic lesions. CT ABDOMEN PELVIS FINDINGS Hepatobiliary: Liver and gallbladder are unremarkable. No biliary ductal dilatation. Pancreas: Negative. Spleen: Negative. Adrenals/Urinary Tract: Nodular thickening of both adrenal glands, unchanged. Renal atrophy bilaterally. Low-attenuation lesions measure up to 1.5 cm on the left and are difficult to further characterize due to size. Ureters are decompressed. Eccentric right posterolateral bladder wall thickening (2/109), similar. Stomach/Bowel: Stomach and majority of the small bowel are unremarkable. Unobstructed small bowel extends into a right inguinal hernia. Small bowel, appendix and colon are otherwise unremarkable. Vascular/Lymphatic: Atherosclerotic calcification of the aorta. Infrarenal aorta measures up to 3.2 cm. Proximal common iliac arteries are aneurysmal bilaterally, measuring 2.0 cm. Reproductive: Brachytherapy seeds in the prostate. Other: Right inguinal hernia contains unobstructed small bowel. Small left inguinal hernia contains fat and small lymph nodes. No free fluid. Mesenteries and peritoneum are otherwise unremarkable. Musculoskeletal: Degenerative changes in the spine, worst at L5-S1. 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 IMPRESSION: 1. Progression of primary bronchogenic carcinoma as evidenced by an enlarging left upper lobe nodule and enlarging mediastinal lymph nodes. 2. Nodular thickening of both adrenal glands, stable. 3. Trace right pleural effusion and small left pleural effusion. 4. Small right inguinal hernia contains unobstructed small bowel. 5. Infrarenal Aortic aneurysm NOS (ICD10-I71.9). Recommend follow-up every 3 years. This recommendation follows ACR consensus guidelines: White Paper of the ACR Incidental Findings Committee II on Vascular Findings. J Am Coll Radiol 2013; 10:789-794. 6. Bilateral common iliac artery aneurysms. 7. Aortic  atherosclerosis (ICD10-I70.0). Coronary artery calcification. 8. Enlarged pulmonic trunk, indicative of pulmonary arterial hypertension. 9.  Emphysema (ICD10-J43.9). Electronically Signed   By: Lorin Picket M.D.   On: 04/30/2020 10:16     Assessment and plan  1. Primary adenocarcinoma of left lung ([place])   2. Encounter for antineoplastic immunotherapy   3. Malignant pleural effusion   4. ESRD (end stage renal disease) ([place])   5. Goals of care, counseling/discussion    Stage IV lung adenocarcinoma with malignant pleural effusion Status post 4 cycle of immunotherapy Keytruda 04/30/2020 CT chest abdomen pelvis - He has a mixed response.  Malignant pleural effusion has not much recurred The spiculated apical left upper lobe nodule is more solid in appearance and is slightly larger-1.9 x 2.4 cm comparing to 1.7 x 2.1 cm on 01/18/2020.  Right paratracheal lymph node has enlarged measuring 13 mm compared to 7 mm low right paratracheal lymph node has enlarged 1.8 cm comparing to 1.2 cm previously.  Hilar lymph node measures up to 13 mm.  Comparison is limited. Subcarinal lymph node enlarged slightly, 1.3 cm compared to 8 mm previously.  Prepericardial lymph node 1.5 mm I had a lengthy discussion with the patient and discussed with him about the CT findings. Patient has had slight disease progression after 4 immunotherapy treatment with Keytruda.  With patient's multiple medical problems including history of stroke, ESRD on dialysis, hypertension, hyperlipidemia, heart murmur, diabetes, depression, performance status of 2, adding additional chemotherapy can be challenging. I doubt him being a good candidate of aggressive chemotherapy. Dose reduced chemotherapy regimen can be tried.  He is at risk having chemotherapy including but  not limited to nausea vomiting diarrhea, increase infection, low blood counts, fatigue, neuropathy etc. Patient is also not very interested in adding additional chemotherapy  regimen at this point.  He prefers to proceed with the same treatment he has been on and tolerated well.  We will proceed with Keytruda. Short-term CT follow-up.  Goals of care was reviewed.  He understands that his condition is not curable  Follow-up 3 week  for evaluation of diarrhea and Keytruda.   Earlie Server, MD, PhD Hematology Oncology [facility] Pager- [phone] 05/07/2020

## 2020-05-09 ENCOUNTER — Other Ambulatory Visit: Payer: Self-pay

## 2020-05-09 ENCOUNTER — Inpatient Hospital Stay: Payer: Medicare Other

## 2020-05-09 VITALS — BP 151/80 | HR 75 | Temp 96.0°F | Resp 16

## 2020-05-09 DIAGNOSIS — Z5112 Encounter for antineoplastic immunotherapy: Secondary | ICD-10-CM | POA: Diagnosis not present

## 2020-05-09 DIAGNOSIS — C3492 Malignant neoplasm of unspecified part of left bronchus or lung: Secondary | ICD-10-CM

## 2020-05-09 MED ORDER — SODIUM CHLORIDE 0.9 % IV SOLN
Freq: Once | INTRAVENOUS | Status: AC
  Filled 2020-05-09: qty 250

## 2020-05-09 MED ORDER — SODIUM CHLORIDE 0.9 % IV SOLN
200.0000 mg | Freq: Once | INTRAVENOUS | Status: AC
  Administered 2020-05-09: 200 mg via INTRAVENOUS
  Filled 2020-05-09: qty 8

## 2020-05-09 NOTE — Progress Notes (Signed)
Keytruda well tolerated. Discharged home in stable condition.

## 2020-05-24 ENCOUNTER — Telehealth: Payer: Self-pay | Admitting: Nurse Practitioner

## 2020-05-24 NOTE — Telephone Encounter (Signed)
Spoke with sister [name], to see if we could change the time of the Palliative f/u visit on 07/02/20 from 3 PM to 2 PM and she was in agreement with this.

## 2020-05-30 ENCOUNTER — Inpatient Hospital Stay: Payer: Medicare Other

## 2020-05-30 ENCOUNTER — Inpatient Hospital Stay: Payer: Medicare Other | Admitting: Oncology

## 2020-06-04 ENCOUNTER — Inpatient Hospital Stay: Payer: Medicare Other

## 2020-06-04 ENCOUNTER — Inpatient Hospital Stay: Payer: Medicare Other | Attending: Oncology

## 2020-06-04 DIAGNOSIS — Z5112 Encounter for antineoplastic immunotherapy: Secondary | ICD-10-CM | POA: Insufficient documentation

## 2020-06-04 DIAGNOSIS — Z923 Personal history of irradiation: Secondary | ICD-10-CM | POA: Insufficient documentation

## 2020-06-04 DIAGNOSIS — Z8546 Personal history of malignant neoplasm of prostate: Secondary | ICD-10-CM | POA: Insufficient documentation

## 2020-06-04 DIAGNOSIS — Z79899 Other long term (current) drug therapy: Secondary | ICD-10-CM | POA: Insufficient documentation

## 2020-06-04 DIAGNOSIS — N186 End stage renal disease: Secondary | ICD-10-CM | POA: Insufficient documentation

## 2020-06-04 DIAGNOSIS — E785 Hyperlipidemia, unspecified: Secondary | ICD-10-CM | POA: Insufficient documentation

## 2020-06-04 DIAGNOSIS — Z7984 Long term (current) use of oral hypoglycemic drugs: Secondary | ICD-10-CM | POA: Insufficient documentation

## 2020-06-04 DIAGNOSIS — C3412 Malignant neoplasm of upper lobe, left bronchus or lung: Secondary | ICD-10-CM | POA: Insufficient documentation

## 2020-06-04 DIAGNOSIS — Z8673 Personal history of transient ischemic attack (TIA), and cerebral infarction without residual deficits: Secondary | ICD-10-CM | POA: Insufficient documentation

## 2020-06-04 DIAGNOSIS — Z992 Dependence on renal dialysis: Secondary | ICD-10-CM | POA: Insufficient documentation

## 2020-06-04 DIAGNOSIS — F17210 Nicotine dependence, cigarettes, uncomplicated: Secondary | ICD-10-CM | POA: Insufficient documentation

## 2020-06-04 DIAGNOSIS — E119 Type 2 diabetes mellitus without complications: Secondary | ICD-10-CM | POA: Insufficient documentation

## 2020-06-04 DIAGNOSIS — Z7982 Long term (current) use of aspirin: Secondary | ICD-10-CM | POA: Insufficient documentation

## 2020-06-04 DIAGNOSIS — F32A Depression, unspecified: Secondary | ICD-10-CM | POA: Insufficient documentation

## 2020-06-04 DIAGNOSIS — Z8616 Personal history of COVID-19: Secondary | ICD-10-CM | POA: Insufficient documentation

## 2020-06-04 DIAGNOSIS — Z9049 Acquired absence of other specified parts of digestive tract: Secondary | ICD-10-CM | POA: Insufficient documentation

## 2020-06-04 DIAGNOSIS — H409 Unspecified glaucoma: Secondary | ICD-10-CM | POA: Insufficient documentation

## 2020-06-04 DIAGNOSIS — I10 Essential (primary) hypertension: Secondary | ICD-10-CM | POA: Insufficient documentation

## 2020-06-04 NOTE — Progress Notes (Signed)
Nutrition Follow-up:  Patient with lung cancer, followed by Dr Tasia Catchings.   Met with patient in the clinic.  Patient reports that his appetite has been good.  Ate a chicken thigh, green beans and chocolate cake last night for dinner.  Lunch was bolonga and cheese sandwich and breakfast was 2 eggs and sausage and toast with jelly.  Reports that he has been trying to drink shake at dialysis but sometimes they don't have it.      Medications: reviewed  Labs: reviewed  Anthropometrics:   Weight today 156 lb 2 oz in clinic, stable weight   NUTRITION DIAGNOSIS: Unintentional weight loss improved    INTERVENTION:  Encouraged patient to continue drinking nutrition shake at dialysis (Tue, Thur, Sat) Continue eating high calorie, high protein. Patient meets with RD at dialysis as well.  Patient has contact information    MONITORING, EVALUATION, GOAL: weight trends, intake   NEXT VISIT: phone call with sister in ~6 weeks.  Nawal Burling B. Zenia Resides, [place], [place] Registered Dietitian [phone] (mobile)

## 2020-06-05 ENCOUNTER — Emergency Department
Admission: EM | Admit: 2020-06-05 | Discharge: 2020-06-05 | Disposition: A | Discharge status: Home / Self Care | Payer: Medicare Other | Attending: Emergency Medicine | Admitting: Emergency Medicine

## 2020-06-05 ENCOUNTER — Encounter: Payer: Self-pay | Admitting: Emergency Medicine

## 2020-06-05 ENCOUNTER — Emergency Department: Payer: Medicare Other

## 2020-06-05 ENCOUNTER — Other Ambulatory Visit: Payer: Self-pay

## 2020-06-05 DIAGNOSIS — I132 Hypertensive heart and chronic kidney disease with heart failure and with stage 5 chronic kidney disease, or end stage renal disease: Secondary | ICD-10-CM | POA: Insufficient documentation

## 2020-06-05 DIAGNOSIS — Z7902 Long term (current) use of antithrombotics/antiplatelets: Secondary | ICD-10-CM | POA: Insufficient documentation

## 2020-06-05 DIAGNOSIS — Z992 Dependence on renal dialysis: Secondary | ICD-10-CM | POA: Insufficient documentation

## 2020-06-05 DIAGNOSIS — N186 End stage renal disease: Secondary | ICD-10-CM | POA: Insufficient documentation

## 2020-06-05 DIAGNOSIS — I5033 Acute on chronic diastolic (congestive) heart failure: Secondary | ICD-10-CM | POA: Insufficient documentation

## 2020-06-05 DIAGNOSIS — E1122 Type 2 diabetes mellitus with diabetic chronic kidney disease: Secondary | ICD-10-CM | POA: Insufficient documentation

## 2020-06-05 DIAGNOSIS — Z79899 Other long term (current) drug therapy: Secondary | ICD-10-CM | POA: Diagnosis not present

## 2020-06-05 DIAGNOSIS — Z8546 Personal history of malignant neoplasm of prostate: Secondary | ICD-10-CM | POA: Diagnosis not present

## 2020-06-05 DIAGNOSIS — Z8673 Personal history of transient ischemic attack (TIA), and cerebral infarction without residual deficits: Secondary | ICD-10-CM | POA: Insufficient documentation

## 2020-06-05 DIAGNOSIS — Z7984 Long term (current) use of oral hypoglycemic drugs: Secondary | ICD-10-CM | POA: Insufficient documentation

## 2020-06-05 DIAGNOSIS — U071 COVID-19: Secondary | ICD-10-CM | POA: Insufficient documentation

## 2020-06-05 DIAGNOSIS — Z7982 Long term (current) use of aspirin: Secondary | ICD-10-CM | POA: Insufficient documentation

## 2020-06-05 DIAGNOSIS — Z85118 Personal history of other malignant neoplasm of bronchus and lung: Secondary | ICD-10-CM | POA: Insufficient documentation

## 2020-06-05 DIAGNOSIS — F17210 Nicotine dependence, cigarettes, uncomplicated: Secondary | ICD-10-CM | POA: Insufficient documentation

## 2020-06-05 DIAGNOSIS — R509 Fever, unspecified: Secondary | ICD-10-CM | POA: Diagnosis present

## 2020-06-05 LAB — COMPREHENSIVE METABOLIC PANEL
ALT: 10 U/L (ref 0–44)
AST: 20 U/L (ref 15–41)
Albumin: 3.6 g/dL (ref 3.5–5.0)
Alkaline Phosphatase: 35 U/L — ABNORMAL LOW (ref 38–126)
Anion gap: 14 (ref 5–15)
BUN: 31 mg/dL — ABNORMAL HIGH (ref 8–23)
CO2: 27 mmol/L (ref 22–32)
Calcium: 8.6 mg/dL — ABNORMAL LOW (ref 8.9–10.3)
Chloride: 95 mmol/L — ABNORMAL LOW (ref 98–111)
Creatinine, Ser: 4.93 mg/dL — ABNORMAL HIGH (ref 0.61–1.24)
GFR, Estimated: 12 mL/min — ABNORMAL LOW (ref >60)
Glucose, Bld: 104 mg/dL — ABNORMAL HIGH (ref 70–99)
Potassium: 3 mmol/L — ABNORMAL LOW (ref 3.5–5.1)
Sodium: 136 mmol/L (ref 135–145)
Total Bilirubin: 1 mg/dL (ref 0.3–1.2)
Total Protein: 7.5 g/dL (ref 6.5–8.1)

## 2020-06-05 LAB — RESP PANEL BY RT-PCR (FLU A&B, COVID) ARPGX2
Influenza A by PCR: NEGATIVE
Influenza B by PCR: NEGATIVE
SARS Coronavirus 2 by RT PCR: POSITIVE — AB

## 2020-06-05 LAB — PROTIME-INR
INR: 1 (ref 0.8–1.2)
Prothrombin Time: 13.2 seconds (ref 11.4–15.2)

## 2020-06-05 LAB — CBC WITH DIFFERENTIAL/PLATELET
Abs Immature Granulocytes: 0.06 10*3/uL (ref 0.00–0.07)
Basophils Absolute: 0.1 10*3/uL (ref 0.0–0.1)
Basophils Relative: 1 %
Eosinophils Absolute: 0.1 10*3/uL (ref 0.0–0.5)
Eosinophils Relative: 2 %
HCT: 32.5 % — ABNORMAL LOW (ref 39.0–52.0)
Hemoglobin: 10.9 g/dL — ABNORMAL LOW (ref 13.0–17.0)
Immature Granulocytes: 1 %
Lymphocytes Relative: 9 %
Lymphs Abs: 0.7 10*3/uL (ref 0.7–4.0)
MCH: 32.1 pg (ref 26.0–34.0)
MCHC: 33.5 g/dL (ref 30.0–36.0)
MCV: 95.6 fL (ref 80.0–100.0)
Monocytes Absolute: 0.7 10*3/uL (ref 0.1–1.0)
Monocytes Relative: 8 %
Neutro Abs: 6.9 10*3/uL (ref 1.7–7.7)
Neutrophils Relative %: 79 %
Platelets: 121 10*3/uL — ABNORMAL LOW (ref 150–400)
RBC: 3.4 MIL/uL — ABNORMAL LOW (ref 4.22–5.81)
RDW: 14.7 % (ref 11.5–15.5)
WBC: 8.6 10*3/uL (ref 4.0–10.5)
nRBC: 0 % (ref 0.0–0.2)

## 2020-06-05 LAB — LACTIC ACID, PLASMA
Lactic Acid, Venous: 1.3 mmol/L (ref 0.5–1.9)
Lactic Acid, Venous: 1 mmol/L (ref 0.5–1.9)

## 2020-06-05 LAB — APTT: aPTT: 32 seconds (ref 24–36)

## 2020-06-05 NOTE — ED Notes (Signed)
This RN drew 1 set of blood cultures when I started the IV.

## 2020-06-05 NOTE — ED Provider Notes (Signed)
[facility] Emergency Department Provider Note   ____________________________________________   Event Date/Time   First MD Initiated Contact with Patient 06/05/20 1627     (approximate)  I have reviewed the triage vital signs and the nursing notes.   HISTORY  Chief Complaint Weakness    HPI NIDAL RIVET is a 75 y.o. male patient was at dialysis when he began having cold chills and a runny nose.  He has a clear nasal discharge.  He missed the last 20 minutes of dialysis because of his symptoms.  He is sneezing.  Has a fever of 101.8.  He says his sister is sick.  He denies any coughing or shortness of breath or bellyache or diarrhea or dysuria.  He does make a little bit of urine.        Past Medical History:  Diagnosis Date  . Chronic kidney disease   . Depression   . Diabetes mellitus without complication ([place])   . Glaucoma   . Glaucoma   . Gout   . Heart murmur   . HLD (hyperlipidemia)   . Hypertension   . Primary adenocarcinoma of left lung ([place])   . Prostate cancer ([place])   . Renal failure    dialysis t/t/s  . Stroke ([place])    tia x 2    Patient Active Problem List   Diagnosis Date Noted  . Encounter for antineoplastic immunotherapy 02/15/2020  . Goals of care, counseling/discussion 12/23/2019  . 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 Malignant neoplasm of upper lobe of left lung ([place]) 12/23/2019  . Thickening of wall of gallbladder   . Primary adenocarcinoma of left lung ([place])   . Hypokalemia 10/30/2019  . Chronic diastolic CHF (congestive heart failure) ([place]) 10/30/2019  . Benign essential HTN 10/30/2019  . Malignant neoplasm of unknown origin ([place]) 10/30/2019  . Acute respiratory failure with hypoxia ([place]) 10/30/2019  . End-stage renal disease on hemodialysis ([place]) 10/30/2019  . Diabetes mellitus type 2, controlled, with complications ([place]) 52/77/8242  . SOB (shortness of breath) 10/29/2019  . Pulmonary edema 10/22/2019   . Diarrhea 10/22/2019  . Acute on chronic diastolic CHF (congestive heart failure) ([place]) 10/22/2019  . HLD (hyperlipidemia)   . Stroke ([place])   . Depression   . Tobacco abuse   . Pleural effusion on left   . Acute urinary retention   . Foot ulcer ([place]) 12/15/2018  . Ischemic foot 10/11/2018  . Atherosclerosis of native arteries of the extremities with gangrene ([place]) 07/09/2018  . Gangrene of toe of right foot ([place]) 06/13/2018  . Type II diabetes mellitus with renal manifestations ([place]) 04/16/2018  . Hypertension 04/16/2018  . ESRD on dialysis ([place]) 04/16/2018  . Malnutrition of moderate degree 03/27/2018  . Renal failure 03/21/2018    Past Surgical History:  Procedure Laterality Date  . AMPUTATION TOE Bilateral 07/23/2018   Procedure: TOE MPJ RIGHT 3RD AND LEFT 2ND;  Surgeon: Samara Deist, DPM;  Location: [facility] ORS;  Service: Podiatry;  Laterality: Bilateral;  . APPENDECTOMY    . AV FISTULA PLACEMENT Left 06/09/2018   Procedure: ARTERIOVENOUS (AV) FISTULA CREATION ( BRACHIAL CEPHALIC);  Surgeon: Algernon Huxley, MD;  Location: [facility] ORS;  Service: Vascular;  Laterality: Left;  . CHOLECYSTECTOMY    . DIALYSIS/PERMA CATHETER INSERTION N/A 03/24/2018   Procedure: DIALYSIS/PERMA CATHETER INSERTION;  Surgeon: Katha Cabal, MD;  Location: [place] [facility];  Service: Cardiovascular;  Laterality: N/A;  . DIALYSIS/PERMA CATHETER REMOVAL N/A 09/06/2018  Procedure: DIALYSIS/PERMA CATHETER REMOVAL;  Surgeon: Algernon Huxley, MD;  Location: [place] [facility];  Service: Cardiovascular;  Laterality: N/A;  . INSERTION PROSTATE RADIATION SEED    . IR PERC PLEURAL DRAIN W/INDWELL CATH W/IMG GUIDE  11/03/2019  . IRRIGATION AND DEBRIDEMENT FOOT Right 12/17/2018   Procedure: IRRIGATION AND DEBRIDEMENT FOOT;  Surgeon: Samara Deist, DPM;  Location: [facility] ORS;  Service: Podiatry;  Laterality: Right;  . LOWER EXTREMITY ANGIOGRAPHY Right 06/16/2018   Procedure: LOWER EXTREMITY ANGIOGRAPHY;  Surgeon: Algernon Huxley, MD;  Location: [place] [facility];  Service: Cardiovascular;  Laterality: Right;  . LOWER EXTREMITY ANGIOGRAPHY Left 08/30/2018   Procedure: LOWER EXTREMITY ANGIOGRAPHY;  Surgeon: Algernon Huxley, MD;  Location: [place] [facility];  Service: Cardiovascular;  Laterality: Left;  . LOWER EXTREMITY ANGIOGRAPHY Right 09/30/2018   Procedure: LOWER EXTREMITY ANGIOGRAPHY;  Surgeon: Algernon Huxley, MD;  Location: [place] [facility];  Service: Cardiovascular;  Laterality: Right;  . LOWER EXTREMITY ANGIOGRAPHY Left 10/07/2018   Procedure: LOWER EXTREMITY ANGIOGRAPHY;  Surgeon: Algernon Huxley, MD;  Location: [place] [facility];  Service: Cardiovascular;  Laterality: Left;  . LOWER EXTREMITY ANGIOGRAPHY Right 12/20/2018   Procedure: Lower Extremity Angiography;  Surgeon: Algernon Huxley, MD;  Location: [place] [facility];  Service: Cardiovascular;  Laterality: Right;  . TRANSMETATARSAL AMPUTATION Bilateral 10/13/2018   Procedure: 1.  Amputation right great toe MTPJ 2.  Amputation right second toe MTPJ 3.  Amputation right fourth toe MTPJ 4.  Amputation right fifth toe MTPJ 5.  Excision distal second metatarsal head right foot 6.  Excision distal third metatarsal head right foot 7.  Amputation left third toe 8.  Incision and drainage infectionleft 2nd toe amputation site. ;  Surgeon: Samara Deist, DPM;  Location    Prior to Admission medications   Medication Sig Start Date End Date Taking? Authorizing Provider  allopurinol (ZYLOPRIM) 100 MG tablet Take 1 tablet (100 mg total) by mouth daily. 10/24/19   Nicole Kindred A, DO  amLODipine (NORVASC) 5 MG tablet Take 10 mg by mouth daily.    [provider]  aspirin 81 MG EC tablet Take 1 tablet (81 mg total) by mouth daily. 10/24/19   Ezekiel Slocumb, DO  atorvastatin (LIPITOR) 10 MG tablet Take 1 tablet (10 mg total) by mouth daily at 6 PM. 10/24/19   Nicole Kindred A, DO  calcium acetate (PHOSLO) 667 MG capsule Take 2 capsules (1,334 mg total) by  mouth 3 (three) times daily with meals. Patient not taking: Reported on 04/16/2020 12/21/18   Fritzi Mandes, MD  clopidogrel (PLAVIX) 75 MG tablet Take 1 tablet (75 mg total) by mouth daily. 10/24/19   Ezekiel Slocumb, DO  diphenoxylate-atropine (LOMOTIL) 2.5-0.025 MG tablet Take 1 tablet by mouth every 12 (twelve) hours as needed for diarrhea or loose stools. Patient not taking: Reported on 02/22/2020 02/15/20   Earlie Server, MD  escitalopram (LEXAPRO) 10 MG tablet Take 1 tablet (10 mg total) by mouth daily. 10/24/19   Nicole Kindred A, DO  gabapentin (NEURONTIN) 300 MG capsule Take 300 mg by mouth 3 (three) times daily.    [provider]  glipiZIDE (GLUCOTROL) 5 MG tablet Take by mouth daily before breakfast.    [provider]  nicotine (NICODERM CQ - DOSED IN MG/24 HOURS) 21 mg/24hr patch Place 1 patch (21 mg total) onto the skin daily. 11/06/19   Sheikh, Omair Latif, DO  ondansetron (ZOFRAN) 4 MG tablet Take 1  tablet (4 mg total) by mouth every 6 (six) hours as needed for nausea. 11/05/19   Raiford Noble Latif, DO  oxyCODONE (OXY IR/ROXICODONE) 5 MG immediate release tablet Take 1 tablet (5 mg total) by mouth every 6 (six) hours as needed for severe pain. Patient not taking: Reported on 01/04/2020 11/05/19   Raiford Noble Latif, DO  tamsulosin (FLOMAX) 0.4 MG CAPS capsule Take 1 capsule (0.4 mg total) by mouth daily. 10/25/19   Ezekiel Slocumb, DO  traMADol (ULTRAM) 50 MG tablet 50 mg. Patient not taking: Reported on 01/04/2020 03/16/19   [provider]    Allergies Penicillin g  Family History  Problem Relation Age of Onset  . Varicose Veins Neg Hx     Social History Social History   Tobacco Use  . Smoking status: Current Some Day Smoker    Packs/day: 0.10    Types: Cigarettes  . Smokeless tobacco: Never Used  . Tobacco comment: pt currently smokes about 3 cigarettes a day   Vaping Use  . Vaping Use: Never used  Substance Use Topics  . Alcohol use: Not Currently     Alcohol/week: 0.0 standard drinks  . Drug use: Never    Review of Systems  Constitutional:  fever/chills Eyes: No visual changes. ENT: No sore throat. Cardiovascular: Denies chest pain. Respiratory: Denies shortness of breath. Gastrointestinal: No abdominal pain.  No nausea, no vomiting.  No diarrhea.  No constipation. Genitourinary: Negative for dysuria. Musculoskeletal: Negative for back pain. Skin: Negative for rash. Neurological: Negative for headaches, focal weakness   ____________________________________________   PHYSICAL EXAM:  VITAL SIGNS: ED Triage Vitals  Enc Vitals Group     BP 06/05/20 1546 118/68     Pulse Rate 06/05/20 1546 88     Resp 06/05/20 1546 18     Temp 06/05/20 1546 (!) 101.8 F (38.8 C)     Temp Source 06/05/20 1546 Oral     SpO2 06/05/20 1546 95 %     Weight 06/05/20 1547 150 lb (68 kg)     Height 06/05/20 1547 6\' 1"  (1.854 m)     Head Circumference --      Peak Flow --      Pain Score 06/05/20 1547 0     Pain Loc --      Pain Edu? --      Excl. in [place]? --     Constitutional: Alert and oriented.  Reports he feels cold and he does have a runny nose Eyes: Conjunctivae are normal.  Head: Atraumatic. Nose:  congestion/rhinnorhea. Mouth/Throat: Mucous membranes are moist.  Oropharynx non-erythematous. Neck: No stridor.   Cardiovascular: Normal rate, regular rhythm. Grossly normal heart sounds.  Good peripheral circulation. Respiratory: Normal respiratory effort.  No retractions. Lungs CTAB. Gastrointestinal: Soft and nontender. No distention. No abdominal bruits.  Musculoskeletal: No lower extremity tenderness nor edema.   Neurologic:  Normal speech and language. No gross focal neurologic deficits are appreciated. . Skin:  Skin is warm, dry and intact. No rash noted.  ____________________________________________   LABS (all labs ordered are listed, but only abnormal results are displayed)  Labs Reviewed  RESP PANEL BY RT-PCR (FLU  A&B, COVID) ARPGX2 - Abnormal; Notable for the following components:      Result Value   SARS Coronavirus 2 by RT PCR POSITIVE (*)    All other components within normal limits  COMPREHENSIVE METABOLIC PANEL - Abnormal; Notable for the following components:   Potassium 3.0 (*)    Chloride  95 (*)    Glucose, Bld 104 (*)    BUN 31 (*)    Creatinine, Ser 4.93 (*)    Calcium 8.6 (*)    Alkaline Phosphatase 35 (*)    GFR, Estimated 12 (*)    All other components within normal limits  CBC WITH DIFFERENTIAL/PLATELET - Abnormal; Notable for the following components:   RBC 3.40 (*)    Hemoglobin 10.9 (*)    HCT 32.5 (*)    Platelets 121 (*)    All other components within normal limits  CULTURE, BLOOD (SINGLE)  URINE CULTURE  LACTIC ACID, PLASMA  LACTIC ACID, PLASMA  PROTIME-INR  APTT  URINALYSIS, COMPLETE (UACMP) WITH MICROSCOPIC   ____________________________________________  EKG EKG read interpreted by me shows normal sinus rhythm rate of 79 severe first-degree AV block with left bundle branch block.  No acute ST-T wave changes  ____________________________________________  RADIOLOGY Gertha Calkin, personally viewed and evaluated these images (plain radiographs) as part of my medical decision making, as well as reviewing the written report by the radiologist.  ED MD interpretation:   Official radiology report(s): Chest x-ray read by radiology reviewed by me shows enlarged heart and some increased markings which may represent CHF or atypical pneumonia. DG Chest 2 View  Result Date: 06/05/2020 CLINICAL DATA:  Fever, weakness. Additional history provided: Patient presents from dialysis with chills and cold symptoms, clear nasal drainage, fever. EXAM: CHEST - 2 VIEW COMPARISON:  CT chest 04/30/2020. chest radiographs 11/11/2019. FINDINGS: Mild cardiomegaly. Aortic atherosclerosis. A known left upper lobe nodule was better appreciated on the prior chest CT of 04/30/2020 in this  patient with a known history of primary bronchogenic carcinoma. Central pulmonary vascular congestion. Additional mild interstitial prominence throughout both lungs. Trace left pleural effusion. No acute bony abnormality is identified. IMPRESSION: Mild cardiomegaly with central pulmonary vascular congestion. Additional mild interstitial prominence throughout both lungs may reflect edema. However, atypical/viral pneumonia cannot be excluded and clinical correlation is recommended. A known left upper lobe nodule (in this patient with known bronchogenic carcinoma) was better appreciated on the chest CT of 04/30/2020. Trace left pleural effusion. Electronically Signed   By: Kellie Simmering DO   On: 06/05/2020 16:30    ____________________________________________   PROCEDURES  Procedure(s) performed (including Critical Care):  Procedures   ____________________________________________   INITIAL IMPRESSION / ASSESSMENT AND PLAN / ED COURSE  Patient with Covid.  He is not hypoxic even with exercise.  He is not having any symptoms except for the fever and runny nose.  I have referred him to the [facility] antibody infusion clinic.  He has multiple risk factors hopefully he will meet criteria for antibody infusion.  He will return if he is sicker at all.             ____________________________________________   FINAL CLINICAL IMPRESSION(S) / ED DIAGNOSES  Final diagnoses:  Fernando Salinas     ED Discharge Orders    None      *Please note:  JACARIUS HANDEL was evaluated in Emergency Department on 06/05/2020 for the symptoms described in the history of present illness. He was evaluated in the context of the global COVID-19 pandemic, which necessitated consideration that the patient might be at risk for infection with the SARS-CoV-2 virus that causes COVID-19. Institutional protocols and algorithms that pertain to the evaluation of patients at risk for COVID-19 are in a state of rapid change based on  information released by regulatory bodies including the CDC and  federal and state organizations. These policies and algorithms were followed during the patient's care in the ED.  Some ED evaluations and interventions may be delayed as a result of limited staffing during and the pandemic.*   Note:  This document was prepared using Dragon voice recognition software and may include unintentional dictation errors.    Nena Polio, MD 06/05/20 [phone]

## 2020-06-05 NOTE — ED Notes (Signed)
Pt states got "cold chills" while in dialysis. Denies pain. Denies NVD. States feels "a little weak" and cold. Pt in room

## 2020-06-05 NOTE — ED Notes (Signed)
Pt in Xray and Xray called and will bring pt to room

## 2020-06-05 NOTE — ED Triage Notes (Signed)
Patient presents to the ED via EMS from dialysis.  Patient states he is having cold chills and cold symptoms.  Patient has clear nasal drainage.  Per EMS, patient missed the last 20 min of dialysis due to symptoms.  Patient is sneezing during triage.  Patient has a fever at this time.  Patient states his sister is also sick.

## 2020-06-05 NOTE — Discharge Instructions (Addendum)
Your Covid test is positive.  Please quarantine yourself as much as possible.  If you have to go out I would wear an N95 mask.  I have referred you to the antibody infusion clinic.  They should contact you by tomorrow afternoon to see if you qualify for an antibody infusion.  It is unlikely but possible that you might not qualify because the supply of the antibodies are very low.  Please return here if you have increasing weakness or shortness of breath or feel sicker.  Your next dialysis will be in 2 days.  If your dialysis provider cannot provide you with dialysis please return here we can do it here.

## 2020-06-05 NOTE — ED Notes (Signed)
[facility] called and will come pick up pt and transport home.

## 2020-06-05 NOTE — ED Triage Notes (Signed)
Pt comes via EMS from Dialysis with c/o weakness. Pt missed last 20 minutes. Pt unable to ambulate, cough and runny nose. BP-149/72, HR-83, O2-98%RA

## 2020-06-06 ENCOUNTER — Other Ambulatory Visit: Payer: Self-pay | Admitting: Adult Health

## 2020-06-06 NOTE — Progress Notes (Signed)
I connected by phone with Jose [name] on 06/06/2020 at 4:38 PM to discuss the potential use of a new treatment for mild to moderate COVID-19 viral infection in non-hospitalized patients.  This patient is a 75 y.o. male that meets the FDA criteria for Emergency Use Authorization of COVID monoclonal antibody casirivimab/imdevimab, bamlanivimab/etesevimab, or sotrovimab.  Has a (+) direct SARS-CoV-2 viral test result  Has mild or moderate COVID-19   Is NOT hospitalized due to COVID-19  Is within 10 days of symptom onset  Has at least one of the high risk factor(s) for progression to severe COVID-19 and/or hospitalization as defined in EUA.  Specific high risk criteria : Older age (>/= 75 yo)   I have spoken and communicated the following to the patient or parent/caregiver regarding COVID monoclonal antibody treatment:  1. FDA has authorized the emergency use for the treatment of mild to moderate COVID-19 in adults and pediatric patients with positive results of direct SARS-CoV-2 viral testing who are 90 years of age and older weighing at least 40 kg, and who are at high risk for progressing to severe COVID-19 and/or hospitalization.  2. The significant known and potential risks and benefits of COVID monoclonal antibody, and the extent to which such potential risks and benefits are unknown.  3. Information on available alternative treatments and the risks and benefits of those alternatives, including clinical trials.  4. Patients treated with COVID monoclonal antibody should continue to self-isolate and use infection control measures (e.g., wear mask, isolate, social distance, avoid sharing personal items, clean and disinfect "high touch" surfaces, and frequent handwashing) according to CDC guidelines.   5. The patient or parent/caregiver has the option to accept or refuse COVID monoclonal antibody treatment.  After reviewing this information with the patient, the patient has agreed to  receive one of the available covid 19 monoclonal antibodies and will be provided an appropriate fact sheet prior to infusion.   Sx onset (fever/cold like sx) 06/05/20 . Covid 19 + test 06/05/20. ESRD patient on HD. Metastatic Lung cancer on Keytruda. Covid vaccine x 2 no booster.   Rexene Edison, NP 06/06/2020 4:38 PM

## 2020-06-07 ENCOUNTER — Ambulatory Visit (HOSPITAL_COMMUNITY)
Admission: RE | Admit: 2020-06-07 | Discharge: 2020-06-07 | Disposition: A | Discharge status: Home / Self Care | Payer: Medicare Other | Source: Ambulatory Visit | Attending: Pulmonary Disease | Admitting: Pulmonary Disease

## 2020-06-07 DIAGNOSIS — U071 COVID-19: Secondary | ICD-10-CM | POA: Diagnosis not present

## 2020-06-07 MED ORDER — METHYLPREDNISOLONE SODIUM SUCC 125 MG IJ SOLR: 125.0000 mg | Freq: Once | INTRAMUSCULAR | Status: DC | PRN

## 2020-06-07 MED ORDER — SODIUM CHLORIDE 0.9 % IV SOLN: INTRAVENOUS | Status: DC | PRN

## 2020-06-07 MED ORDER — DIPHENHYDRAMINE HCL 50 MG/ML IJ SOLN: 50.0000 mg | Freq: Once | INTRAMUSCULAR | Status: DC | PRN

## 2020-06-07 MED ORDER — EPINEPHRINE 0.3 MG/0.3ML IJ SOAJ: 0.3000 mg | Freq: Once | INTRAMUSCULAR | Status: DC | PRN

## 2020-06-07 MED ORDER — SOTROVIMAB 500 MG/8ML IV SOLN
500.0000 mg | Freq: Once | INTRAVENOUS | Status: AC
  Administered 2020-06-07: 500 mg via INTRAVENOUS

## 2020-06-07 MED ORDER — ALBUTEROL SULFATE HFA 108 (90 BASE) MCG/ACT IN AERS: 2.0000 | INHALATION_SPRAY | Freq: Once | RESPIRATORY_TRACT | Status: DC | PRN

## 2020-06-07 MED ORDER — FAMOTIDINE IN NACL 20-0.9 MG/50ML-% IV SOLN: 20.0000 mg | Freq: Once | INTRAVENOUS | Status: DC | PRN

## 2020-06-07 NOTE — Progress Notes (Signed)
Diagnosis: COVID-19  Physician: Dr. Patrick Wright  Procedure: Covid Infusion Clinic Med: Sotrovimab infusion - Provided patient with sotrovimab fact sheet for patients, parents, and caregivers prior to infusion.   Complications: No immediate complications noted  Discharge: Discharged home    

## 2020-06-07 NOTE — Progress Notes (Signed)
Patient reviewed Fact Sheet for Patients, Parents, and Caregivers for Emergency Use Authorization (EUA) of sotrovimab for the Treatment of Coronavirus. Patient also reviewed and is agreeable to the estimated cost of treatment. Patient is agreeable to proceed.   

## 2020-06-07 NOTE — Discharge Instructions (Signed)
10 Things You Can Do to Manage Your COVID-19 Symptoms at Home If you have possible or confirmed COVID-19: 1. Stay home from work and school. And stay away from other public places. If you must go out, avoid using any kind of public transportation, ridesharing, or taxis. 2. Monitor your symptoms carefully. If your symptoms get worse, call your healthcare provider immediately. 3. Get rest and stay hydrated. 4. If you have a medical appointment, call the healthcare provider ahead of time and tell them that you have or may have COVID-19. 5. For medical emergencies, call 911 and notify the dispatch personnel that you have or may have COVID-19. 6. Cover your cough and sneezes with a tissue or use the inside of your elbow. 7. Wash your hands often with soap and water for at least 20 seconds or clean your hands with an alcohol-based hand sanitizer that contains at least 60% alcohol. 8. As much as possible, stay in a specific room and away from other people in your home. Also, you should use a separate bathroom, if available. If you need to be around other people in or outside of the home, wear a mask. 9. Avoid sharing personal items with other people in your household, like dishes, towels, and bedding. 10. Clean all surfaces that are touched often, like counters, tabletops, and doorknobs. Use household cleaning sprays or wipes according to the label instructions. cdc.gov/coronavirus 12/01/2018 This information is not intended to replace advice given to you by your health care provider. Make sure you discuss any questions you have with your health care provider. Document Revised: 05/05/2019 Document Reviewed: 05/05/2019 Elsevier Patient Education  2020 Elsevier Inc. What types of side effects do monoclonal antibody drugs cause?  Common side effects  In general, the more common side effects caused by monoclonal antibody drugs include: . Allergic reactions, such as hives or itching . Flu-like signs and  symptoms, including chills, fatigue, fever, and muscle aches and pains . Nausea, vomiting . Diarrhea . Skin rashes . Low blood pressure   The CDC is recommending patients who receive monoclonal antibody treatments wait at least 90 days before being vaccinated.  Currently, there are no data on the safety and efficacy of mRNA COVID-19 vaccines in persons who received monoclonal antibodies or convalescent plasma as part of COVID-19 treatment. Based on the estimated half-life of such therapies as well as evidence suggesting that reinfection is uncommon in the 90 days after initial infection, vaccination should be deferred for at least 90 days, as a precautionary measure until additional information becomes available, to avoid interference of the antibody treatment with vaccine-induced immune responses. If you have any questions or concerns after the infusion please call the Advanced Practice Provider on call at [phone]. This number is ONLY intended for your use regarding questions or concerns about the infusion post-treatment side-effects.  Please do not provide this number to others for use. For return to work notes please contact your primary care provider.   If someone you know is interested in receiving treatment please have them call the COVID hotline at [phone].   

## 2020-06-10 LAB — CULTURE, BLOOD (SINGLE): Culture: NO GROWTH

## 2020-06-12 ENCOUNTER — Inpatient Hospital Stay (HOSPITAL_BASED_OUTPATIENT_CLINIC_OR_DEPARTMENT_OTHER): Payer: Medicare Other | Admitting: Hospice and Palliative Medicine

## 2020-06-12 DIAGNOSIS — Z515 Encounter for palliative care: Secondary | ICD-10-CM

## 2020-06-12 NOTE — Progress Notes (Signed)
I tried calling patient for scheduled virtual visit. I was informed by family the patient was not available and to call back later. I attempted to call back later and family appeared to hang up the phone when I asked if I could speak with patient. Will plan to reschedule.

## 2020-06-13 ENCOUNTER — Inpatient Hospital Stay: Payer: Medicare Other

## 2020-06-13 ENCOUNTER — Inpatient Hospital Stay: Payer: Medicare Other | Admitting: Oncology

## 2020-06-20 ENCOUNTER — Inpatient Hospital Stay (HOSPITAL_BASED_OUTPATIENT_CLINIC_OR_DEPARTMENT_OTHER): Payer: Medicare Other | Admitting: Oncology

## 2020-06-20 ENCOUNTER — Inpatient Hospital Stay: Payer: Medicare Other

## 2020-06-20 ENCOUNTER — Encounter: Payer: Self-pay | Admitting: Oncology

## 2020-06-20 VITALS — BP 153/92 | HR 73 | Temp 96.1°F | Resp 18 | Wt 156.9 lb

## 2020-06-20 DIAGNOSIS — Z992 Dependence on renal dialysis: Secondary | ICD-10-CM | POA: Diagnosis not present

## 2020-06-20 DIAGNOSIS — C3492 Malignant neoplasm of unspecified part of left bronchus or lung: Secondary | ICD-10-CM

## 2020-06-20 DIAGNOSIS — R059 Cough, unspecified: Secondary | ICD-10-CM

## 2020-06-20 DIAGNOSIS — I10 Essential (primary) hypertension: Secondary | ICD-10-CM | POA: Diagnosis not present

## 2020-06-20 DIAGNOSIS — C3412 Malignant neoplasm of upper lobe, left bronchus or lung: Secondary | ICD-10-CM | POA: Diagnosis present

## 2020-06-20 DIAGNOSIS — Z79899 Other long term (current) drug therapy: Secondary | ICD-10-CM | POA: Diagnosis not present

## 2020-06-20 DIAGNOSIS — Z8673 Personal history of transient ischemic attack (TIA), and cerebral infarction without residual deficits: Secondary | ICD-10-CM | POA: Diagnosis not present

## 2020-06-20 DIAGNOSIS — E119 Type 2 diabetes mellitus without complications: Secondary | ICD-10-CM | POA: Diagnosis not present

## 2020-06-20 DIAGNOSIS — Z8546 Personal history of malignant neoplasm of prostate: Secondary | ICD-10-CM | POA: Diagnosis not present

## 2020-06-20 DIAGNOSIS — Z8616 Personal history of COVID-19: Secondary | ICD-10-CM | POA: Diagnosis not present

## 2020-06-20 DIAGNOSIS — H409 Unspecified glaucoma: Secondary | ICD-10-CM | POA: Diagnosis not present

## 2020-06-20 DIAGNOSIS — F17210 Nicotine dependence, cigarettes, uncomplicated: Secondary | ICD-10-CM | POA: Diagnosis not present

## 2020-06-20 DIAGNOSIS — Z923 Personal history of irradiation: Secondary | ICD-10-CM | POA: Diagnosis not present

## 2020-06-20 DIAGNOSIS — Z9049 Acquired absence of other specified parts of digestive tract: Secondary | ICD-10-CM | POA: Diagnosis not present

## 2020-06-20 DIAGNOSIS — F32A Depression, unspecified: Secondary | ICD-10-CM | POA: Diagnosis not present

## 2020-06-20 DIAGNOSIS — N186 End stage renal disease: Secondary | ICD-10-CM

## 2020-06-20 DIAGNOSIS — Z5112 Encounter for antineoplastic immunotherapy: Secondary | ICD-10-CM

## 2020-06-20 DIAGNOSIS — Z7982 Long term (current) use of aspirin: Secondary | ICD-10-CM | POA: Diagnosis not present

## 2020-06-20 DIAGNOSIS — E785 Hyperlipidemia, unspecified: Secondary | ICD-10-CM | POA: Diagnosis not present

## 2020-06-20 DIAGNOSIS — Z7984 Long term (current) use of oral hypoglycemic drugs: Secondary | ICD-10-CM | POA: Diagnosis not present

## 2020-06-20 LAB — CBC WITH DIFFERENTIAL/PLATELET
Abs Immature Granulocytes: 0.03 10*3/uL (ref 0.00–0.07)
Basophils Absolute: 0.1 10*3/uL (ref 0.0–0.1)
Basophils Relative: 1 %
Eosinophils Absolute: 0.2 10*3/uL (ref 0.0–0.5)
Eosinophils Relative: 3 %
HCT: 33.5 % — ABNORMAL LOW (ref 39.0–52.0)
Hemoglobin: 11.1 g/dL — ABNORMAL LOW (ref 13.0–17.0)
Immature Granulocytes: 0 %
Lymphocytes Relative: 17 %
Lymphs Abs: 1.2 10*3/uL (ref 0.7–4.0)
MCH: 32.3 pg (ref 26.0–34.0)
MCHC: 33.1 g/dL (ref 30.0–36.0)
MCV: 97.4 fL (ref 80.0–100.0)
Monocytes Absolute: 0.6 10*3/uL (ref 0.1–1.0)
Monocytes Relative: 8 %
Neutro Abs: 4.7 10*3/uL (ref 1.7–7.7)
Neutrophils Relative %: 71 %
Platelets: 128 10*3/uL — ABNORMAL LOW (ref 150–400)
RBC: 3.44 MIL/uL — ABNORMAL LOW (ref 4.22–5.81)
RDW: 14.9 % (ref 11.5–15.5)
WBC: 6.8 10*3/uL (ref 4.0–10.5)
nRBC: 0 % (ref 0.0–0.2)

## 2020-06-20 LAB — COMPREHENSIVE METABOLIC PANEL
ALT: 12 U/L (ref 0–44)
AST: 23 U/L (ref 15–41)
Albumin: 3.5 g/dL (ref 3.5–5.0)
Alkaline Phosphatase: 30 U/L — ABNORMAL LOW (ref 38–126)
Anion gap: 14 (ref 5–15)
BUN: 57 mg/dL — ABNORMAL HIGH (ref 8–23)
CO2: 24 mmol/L (ref 22–32)
Calcium: 8.9 mg/dL (ref 8.9–10.3)
Chloride: 98 mmol/L (ref 98–111)
Creatinine, Ser: 8.28 mg/dL — ABNORMAL HIGH (ref 0.61–1.24)
GFR, Estimated: 6 mL/min — ABNORMAL LOW (ref >60)
Glucose, Bld: 183 mg/dL — ABNORMAL HIGH (ref 70–99)
Potassium: 3.5 mmol/L (ref 3.5–5.1)
Sodium: 136 mmol/L (ref 135–145)
Total Bilirubin: 0.4 mg/dL (ref 0.3–1.2)
Total Protein: 7.2 g/dL (ref 6.5–8.1)

## 2020-06-20 LAB — TSH: TSH: 6.471 u[IU]/mL — ABNORMAL HIGH (ref 0.350–4.500)

## 2020-06-20 MED ORDER — SODIUM CHLORIDE 0.9 % IV SOLN
200.0000 mg | Freq: Once | INTRAVENOUS | Status: AC
  Administered 2020-06-20: 200 mg via INTRAVENOUS
  Filled 2020-06-20: qty 8

## 2020-06-20 MED ORDER — GUAIFENESIN ER 600 MG PO TB12: 600.0000 mg | ORAL_TABLET | Freq: Two times a day (BID) | ORAL | 0 refills | Status: DC

## 2020-06-20 MED ORDER — SODIUM CHLORIDE 0.9 % IV SOLN
Freq: Once | INTRAVENOUS | Status: AC
  Filled 2020-06-20: qty 250

## 2020-06-20 NOTE — Progress Notes (Signed)
Labs and patients past diagnosis of COVID on 06/05/20 reviewed with MD and treatment team. Per Dr Tasia Catchings to continue with Beryle Flock today.   Pt tolerated treatment well with no signs of complications. VSS. Transportation called. Pt stable for discharge.   Jose [name] [employer]

## 2020-06-20 NOTE — Progress Notes (Signed)
Hematology/Oncology Follow Up Note [facility]  [phone] [phone]  Patient Care Team: [name], MD as PCP - General (Family Medicine) Telford Nab, RN as Oncology Nurse Navigator   Name of the patient: Jose [name]  [id number]  1946/02/27   REASON FOR VISIT  follow-up for lung cancer   PERTINENT ONCOLOGY HISTORY 75 y.o. with past medical history hemodialysis ESRD, history of prostate cancer, stage IV lung cancer presents for follow-up # Patient has left pleural catheter placed, per patient he has home care nurse checking in and draining the pleural catheter.  He denies any shortness of breath at resting or with exertion. He lives at home with sister.  His nephew sometimes helps him with transportation.  Identified biggest barrier for him for follow-up visits/treatment visits is transportation.  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 Chronic findings include remote infarcts, moderate cerebral atrophy, chronic ischemic changes, Nonocclusive left V4 segment thrombus with slow flow. Moderate narrowing of the bilateral supraclinoid ICAs  INTERVAL HISTORY Jose [name] is a 75 y.o. male who has above history reviewed by me today presents for follow up visit for management of stage IV lung cancer Problems and complaints are listed below Last treatment was 05/07/20.  He did not come to his appointment on 05/28/2020. Went to emergency room on 06/05/2020 due to nasal congestion, chills, fever.  COVID19 positive.  He later brought monoclonal antibody treatment. Today he presents for evaluation prior to treatment. Chronic cough, productive with clear phlegm.  denies any shortness of breath more than his baseline, or chest pain.   Review of Systems  Constitutional: Positive for fatigue. Negative for appetite  change, chills, fever and unexpected weight change.  HENT:   Negative for hearing loss and voice change.   Eyes: Negative for eye problems and icterus.  Respiratory: Negative for chest tightness, cough and shortness of breath.   Cardiovascular: Negative for chest pain and leg swelling.  Gastrointestinal: Negative for abdominal distention, abdominal pain and diarrhea.  Endocrine: Negative for hot flashes.  Genitourinary: Negative for difficulty urinating, dysuria and frequency.   Musculoskeletal: Negative for arthralgias.  Skin: Negative for itching and rash.  Neurological: Negative for light-headedness and numbness.  Hematological: Negative for adenopathy. Does not bruise/bleed easily.  Psychiatric/Behavioral: Negative for confusion.       Allergies  Allergen Reactions  . Penicillin G Other (See Comments)    Other reaction(s): Unknown DID THE REACTION INVOLVE: Swelling of the face/tongue/throat, SOB, or low BP? Unknown Sudden or severe rash/hives, skin peeling, or the inside of the mouth or nose? Unknown Did it require medical treatment? Unknown When did it last happen?unknown If all above answers are "NO", may proceed with cephalosporin use.      Past Medical History:  Diagnosis Date  . Chronic kidney disease   . Depression   . Diabetes mellitus without complication ([place])   . Glaucoma   . Glaucoma   . Gout   . Heart murmur   . HLD (hyperlipidemia)   . Hypertension   . Primary adenocarcinoma of left lung ([place])   . Prostate cancer ([place])   . Renal failure    dialysis t/t/s  . Stroke ([place])    tia x 2     Past Surgical History:  Procedure Laterality Date  . AMPUTATION TOE Bilateral 07/23/2018   Procedure: TOE  MPJ RIGHT 3RD AND LEFT 2ND;  Surgeon: Samara Deist, DPM;  Location: [facility];  Service: Podiatry;  Laterality: Bilateral;  . APPENDECTOMY    . AV FISTULA PLACEMENT Left 06/09/2018   Procedure: ARTERIOVENOUS (AV) FISTULA CREATION ( BRACHIAL CEPHALIC);  Surgeon:  Algernon Huxley, MD;  Location: [facility];  Service: Vascular;  Laterality: Left;  . CHOLECYSTECTOMY    . DIALYSIS/PERMA CATHETER INSERTION N/A 03/24/2018   Procedure: DIALYSIS/PERMA CATHETER INSERTION;  Surgeon: Katha Cabal, MD;  Location: [place] [facility];  Service: Cardiovascular;  Laterality: N/A;  . DIALYSIS/PERMA CATHETER REMOVAL N/A 09/06/2018   Procedure: DIALYSIS/PERMA CATHETER REMOVAL;  Surgeon: Algernon Huxley, MD;  Location: [place] [facility];  Service: Cardiovascular;  Laterality: N/A;  . INSERTION PROSTATE RADIATION SEED    . IR PERC PLEURAL DRAIN W/INDWELL CATH W/IMG GUIDE  11/03/2019  . IRRIGATION AND DEBRIDEMENT FOOT Right 12/17/2018   Procedure: IRRIGATION AND DEBRIDEMENT FOOT;  Surgeon: Samara Deist, DPM;  Location: [facility];  Service: Podiatry;  Laterality: Right;  . LOWER EXTREMITY ANGIOGRAPHY Right 06/16/2018   Procedure: LOWER EXTREMITY ANGIOGRAPHY;  Surgeon: Algernon Huxley, MD;  Location: [place] [facility];  Service: Cardiovascular;  Laterality: Right;  . LOWER EXTREMITY ANGIOGRAPHY Left 08/30/2018   Procedure: LOWER EXTREMITY ANGIOGRAPHY;  Surgeon: Algernon Huxley, MD;  Location: [place] [facility];  Service: Cardiovascular;  Laterality: Left;  . LOWER EXTREMITY ANGIOGRAPHY Right 09/30/2018   Procedure: LOWER EXTREMITY ANGIOGRAPHY;  Surgeon: Algernon Huxley, MD;  Location: [place] [facility];  Service: Cardiovascular;  Laterality: Right;  . LOWER EXTREMITY ANGIOGRAPHY Left 10/07/2018   Procedure: LOWER EXTREMITY ANGIOGRAPHY;  Surgeon: Algernon Huxley, MD;  Location: [place] [facility];  Service: Cardiovascular;  Laterality: Left;  . LOWER EXTREMITY ANGIOGRAPHY Right 12/20/2018   Procedure: Lower Extremity Angiography;  Surgeon: Algernon Huxley, MD;  Location: [place] [facility];  Service: Cardiovascular;  Laterality: Right;  . TRANSMETATARSAL AMPUTATION Bilateral 10/13/2018   Procedure: 1.  Amputation right great toe MTPJ 2.  Amputation right second toe MTPJ 3.  Amputation  right fourth toe MTPJ 4.  Amputation right fifth toe MTPJ 5.  Excision distal second metatarsal head right foot 6.  Excision distal third metatarsal head right foot 7.  Amputation left third toe 8.  Incision and drainage infectionleft 2nd toe amputation site. ;  Surgeon: Samara Deist, DPM;  Location    Social History   Socioeconomic History  . Marital status: Widowed    Spouse name: Not on file  . Number of children: Not on file  . Years of education: Not on file  . Highest education level: Not on file  Occupational History    Employer: RETIRED  Tobacco Use  . Smoking status: Current Some Day Smoker    Packs/day: 0.10    Types: Cigarettes  . Smokeless tobacco: Never Used  . Tobacco comment: pt currently smokes about 3 cigarettes a day   Vaping Use  . Vaping Use: Never used  Substance and Sexual Activity  . Alcohol use: Not Currently    Alcohol/week: 0.0 standard drinks  . Drug use: Never  . Sexual activity: Not Currently  Other Topics Concern  . Not on file  Social History Narrative  . Not on file   Social Determinants of Health   Financial Resource Strain: Not on file  Food Insecurity: Not on file  Transportation Needs: Not on file  Physical Activity: Not on file  Stress: Not on file  Social Connections: Not on file  Intimate Partner Violence: Not on file    Family History  Problem Relation Age of Onset  . Varicose Veins Neg Hx      Current Outpatient Medications:  .  allopurinol (ZYLOPRIM) 100 MG tablet, Take 1 tablet (100 mg total) by mouth daily., Disp: 30 tablet, Rfl: 2 .  amLODipine (NORVASC) 5 MG tablet, Take 10 mg by mouth daily., Disp: , Rfl:  .  aspirin 81 MG EC tablet, Take 1 tablet (81 mg total) by mouth daily., Disp: 30 tablet, Rfl: 2 .  atorvastatin (LIPITOR) 10 MG tablet, Take 1 tablet (10 mg total) by mouth daily at 6 PM., Disp: 30 tablet, Rfl: 2 .  clopidogrel (PLAVIX) 75 MG tablet, Take 1 tablet (75 mg total) by mouth daily., Disp: 30 tablet,  Rfl: 2 .  escitalopram (LEXAPRO) 10 MG tablet, Take 1 tablet (10 mg total) by mouth daily., Disp: 30 tablet, Rfl: 2 .  gabapentin (NEURONTIN) 300 MG capsule, Take 300 mg by mouth 3 (three) times daily., Disp: , Rfl:  .  glipiZIDE (GLUCOTROL) 5 MG tablet, Take by mouth daily before breakfast., Disp: , Rfl:  .  guaiFENesin (MUCINEX) 600 MG 12 hr tablet, Take 1 tablet (600 mg total) by mouth 2 (two) times daily., Disp: 60 tablet, Rfl: 0 .  nicotine (NICODERM CQ - DOSED IN MG/24 HOURS) 21 mg/24hr patch, Place 1 patch (21 mg total) onto the skin daily., Disp: 28 patch, Rfl: 0 .  ondansetron (ZOFRAN) 4 MG tablet, Take 1 tablet (4 mg total) by mouth every 6 (six) hours as needed for nausea., Disp: 20 tablet, Rfl: 0 .  tamsulosin (FLOMAX) 0.4 MG CAPS capsule, Take 1 capsule (0.4 mg total) by mouth daily., Disp: 30 capsule, Rfl: 2 .  calcium acetate (PHOSLO) 667 MG capsule, Take 2 capsules (1,334 mg total) by mouth 3 (three) times daily with meals. (Patient not taking: No sig reported), Disp: 90 capsule, Rfl: 0 .  diphenoxylate-atropine (LOMOTIL) 2.5-0.025 MG tablet, Take 1 tablet by mouth every 12 (twelve) hours as needed for diarrhea or loose stools. (Patient not taking: No sig reported), Disp: 14 tablet, Rfl: 0 .  oxyCODONE (OXY IR/ROXICODONE) 5 MG immediate release tablet, Take 1 tablet (5 mg total) by mouth every 6 (six) hours as needed for severe pain. (Patient not taking: No sig reported), Disp: 10 tablet, Rfl: 0 .  traMADol (ULTRAM) 50 MG tablet, 50 mg. (Patient not taking: No sig reported), Disp: , Rfl:   Physical exam:  Vitals:   06/20/20 0910  BP: (!) 153/92  Pulse: 73  Resp: 18  Temp: (!) 96.1 F (35.6 C)  SpO2: 98%  Weight: 156 lb 14.4 oz (71.2 kg)   Physical Exam Constitutional:      General: He is not in acute distress.    Appearance: He is not diaphoretic.     Comments: Patient sits in the wheelchair  HENT:     Head: Normocephalic and atraumatic.     Nose: Nose normal.      Mouth/Throat:     Pharynx: No oropharyngeal exudate.  Eyes:     General: No scleral icterus.    Pupils: Pupils are equal, round, and reactive to light.  Cardiovascular:     Rate and Rhythm: Normal rate and regular rhythm.     Heart sounds: No murmur heard.   Pulmonary:     Effort: Pulmonary effort is normal. No respiratory distress.     Breath sounds: No rales.  Comments: Severely decreased breath sound bilaterally.  Left worse than right Chest:     Chest wall: No tenderness.  Abdominal:     General: There is no distension.     Palpations: Abdomen is soft.     Tenderness: There is no abdominal tenderness.  Musculoskeletal:        General: Normal range of motion.     Cervical back: Normal range of motion and neck supple.  Skin:    General: Skin is warm and dry.     Findings: No erythema.  Neurological:     Mental Status: He is alert and oriented to person, place, and time.     Cranial Nerves: No cranial nerve deficit.     Motor: No abnormal muscle tone.     Coordination: Coordination normal.  Psychiatric:        Mood and Affect: Affect normal.       CMP Latest Ref Rng & Units 06/20/2020  Glucose 70 - 99 mg/dL 183(H)  BUN 8 - 23 mg/dL 57(H)  Creatinine 0.61 - 1.24 mg/dL 8.28(H)  Sodium 135 - 145 mmol/L 136  Potassium 3.5 - 5.1 mmol/L 3.5  Chloride 98 - 111 mmol/L 98  CO2 22 - 32 mmol/L 24  Calcium 8.9 - 10.3 mg/dL 8.9  Total Protein 6.5 - 8.1 g/dL 7.2  Total Bilirubin 0.3 - 1.2 mg/dL 0.4  Alkaline Phos 38 - 126 U/L 30(L)  AST 15 - 41 U/L 23  ALT 0 - 44 U/L 12   CBC Latest Ref Rng & Units 06/20/2020  WBC 4.0 - 10.5 K/uL 6.8  Hemoglobin 13.0 - 17.0 g/dL 11.1(L)  Hematocrit 39.0 - 52.0 % 33.5(L)  Platelets 150 - 400 K/uL 128(L)    RADIOGRAPHIC STUDIES: I have personally reviewed the radiological images as listed and agreed with the findings in the report. DG Chest 2 View  Result Date: 06/05/2020 CLINICAL DATA:  Fever, weakness. Additional history provided:  Patient presents from dialysis with chills and cold symptoms, clear nasal drainage, fever. EXAM: CHEST - 2 VIEW COMPARISON:  CT chest 04/30/2020. chest radiographs 11/11/2019. FINDINGS: Mild cardiomegaly. Aortic atherosclerosis. A known left upper lobe nodule was better appreciated on the prior chest CT of 04/30/2020 in this patient with a known history of primary bronchogenic carcinoma. Central pulmonary vascular congestion. Additional mild interstitial prominence throughout both lungs. Trace left pleural effusion. No acute bony abnormality is identified. IMPRESSION: Mild cardiomegaly with central pulmonary vascular congestion. Additional mild interstitial prominence throughout both lungs may reflect edema. However, atypical/viral pneumonia cannot be excluded and clinical correlation is recommended. A known left upper lobe nodule (in this patient with known bronchogenic carcinoma) was better appreciated on the chest CT of 04/30/2020. Trace left pleural effusion. Electronically Signed   By: Kellie Simmering DO   On: 06/05/2020 16:30     Assessment and plan  1. Primary adenocarcinoma of left lung ([place])   2. Encounter for antineoplastic immunotherapy   3. 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 That he received `5th Keytruda treatment.  Labs reviewed and patient.  Proceed with Keytruda treatment today. Plan to repeat CT scan after another 4 treatments of Keytruda for further evaluation of treatment response.  Cough, likely due to recent COVID 19 infection. Prescription of Mucinex 600 mg twice daily sent to pharmacy.  ESRD on hemodialysis, continue follow-up with  nephrology.  Follow-up 3 week  for evaluation of diarrhea and Keytruda.   Earlie Server, MD, PhD Hematology Oncology [facility] at [facility] Pager-  [phone] 06/20/2020

## 2020-06-20 NOTE — Progress Notes (Signed)
Patient reports chronic cough for a few months.  Went to ER a coupe of weeks ago and was diagnosed with COVID.

## 2020-06-21 ENCOUNTER — Telehealth: Payer: Self-pay | Admitting: Nurse Practitioner

## 2020-06-21 NOTE — Telephone Encounter (Signed)
Called patient to see if we could reschedule the 07/02/20 Palliative f/u visit (to make room for a new referral), and rec'd a message stating "The person you are trying to reach is unavailable right now".  I then tried calling sister, [name] and person who answered the phone said that she was at work and wouldn't be home until around 4 PM.  Will f/u later this afternoon.

## 2020-07-02 ENCOUNTER — Other Ambulatory Visit: Payer: Medicare Other | Admitting: Nurse Practitioner

## 2020-07-02 ENCOUNTER — Telehealth: Payer: Self-pay | Admitting: Nurse Practitioner

## 2020-07-02 ENCOUNTER — Other Ambulatory Visit: Payer: Self-pay

## 2020-07-02 NOTE — Telephone Encounter (Signed)
I called Jose [name] 1:00pm; 1:15pm; 1:30pm; 1:45pm on home, and cell phone, no answer, unable to leave a message to try to confirm and covid screen for f/u PC visit. Will continue to try to reschedule visit

## 2020-07-06 ENCOUNTER — Telehealth: Payer: Self-pay | Admitting: Nurse Practitioner

## 2020-07-06 NOTE — Telephone Encounter (Signed)
I called [name] to schedule f/u PC visit, no answer on home or cell phone, unable to leave message, will continue to attempt to contact

## 2020-07-11 ENCOUNTER — Inpatient Hospital Stay: Payer: Medicare Other

## 2020-07-11 ENCOUNTER — Encounter: Payer: Self-pay | Admitting: Oncology

## 2020-07-11 ENCOUNTER — Other Ambulatory Visit: Payer: Self-pay

## 2020-07-11 ENCOUNTER — Inpatient Hospital Stay: Payer: Medicare Other | Attending: Oncology

## 2020-07-11 ENCOUNTER — Inpatient Hospital Stay (HOSPITAL_BASED_OUTPATIENT_CLINIC_OR_DEPARTMENT_OTHER): Payer: Medicare Other | Admitting: Oncology

## 2020-07-11 VITALS — BP 150/92 | HR 76 | Temp 97.8°F | Resp 16 | Wt 151.2 lb

## 2020-07-11 DIAGNOSIS — Z89421 Acquired absence of other right toe(s): Secondary | ICD-10-CM | POA: Diagnosis not present

## 2020-07-11 DIAGNOSIS — F17210 Nicotine dependence, cigarettes, uncomplicated: Secondary | ICD-10-CM | POA: Insufficient documentation

## 2020-07-11 DIAGNOSIS — C3492 Malignant neoplasm of unspecified part of left bronchus or lung: Secondary | ICD-10-CM

## 2020-07-11 DIAGNOSIS — Z7982 Long term (current) use of aspirin: Secondary | ICD-10-CM | POA: Diagnosis not present

## 2020-07-11 DIAGNOSIS — N186 End stage renal disease: Secondary | ICD-10-CM

## 2020-07-11 DIAGNOSIS — R634 Abnormal weight loss: Secondary | ICD-10-CM

## 2020-07-11 DIAGNOSIS — Z7984 Long term (current) use of oral hypoglycemic drugs: Secondary | ICD-10-CM | POA: Insufficient documentation

## 2020-07-11 DIAGNOSIS — Z5112 Encounter for antineoplastic immunotherapy: Secondary | ICD-10-CM | POA: Diagnosis not present

## 2020-07-11 DIAGNOSIS — I10 Essential (primary) hypertension: Secondary | ICD-10-CM | POA: Insufficient documentation

## 2020-07-11 DIAGNOSIS — Z79899 Other long term (current) drug therapy: Secondary | ICD-10-CM | POA: Insufficient documentation

## 2020-07-11 DIAGNOSIS — Z8546 Personal history of malignant neoplasm of prostate: Secondary | ICD-10-CM | POA: Diagnosis not present

## 2020-07-11 DIAGNOSIS — E785 Hyperlipidemia, unspecified: Secondary | ICD-10-CM | POA: Diagnosis not present

## 2020-07-11 DIAGNOSIS — Z923 Personal history of irradiation: Secondary | ICD-10-CM | POA: Diagnosis not present

## 2020-07-11 DIAGNOSIS — Z9049 Acquired absence of other specified parts of digestive tract: Secondary | ICD-10-CM | POA: Diagnosis not present

## 2020-07-11 DIAGNOSIS — Z8616 Personal history of COVID-19: Secondary | ICD-10-CM | POA: Diagnosis not present

## 2020-07-11 DIAGNOSIS — F32A Depression, unspecified: Secondary | ICD-10-CM | POA: Insufficient documentation

## 2020-07-11 DIAGNOSIS — Z8673 Personal history of transient ischemic attack (TIA), and cerebral infarction without residual deficits: Secondary | ICD-10-CM | POA: Diagnosis not present

## 2020-07-11 DIAGNOSIS — Z89422 Acquired absence of other left toe(s): Secondary | ICD-10-CM | POA: Insufficient documentation

## 2020-07-11 DIAGNOSIS — E119 Type 2 diabetes mellitus without complications: Secondary | ICD-10-CM | POA: Insufficient documentation

## 2020-07-11 DIAGNOSIS — Z992 Dependence on renal dialysis: Secondary | ICD-10-CM | POA: Insufficient documentation

## 2020-07-11 LAB — COMPREHENSIVE METABOLIC PANEL
ALT: 14 U/L (ref 0–44)
AST: 19 U/L (ref 15–41)
Albumin: 3.4 g/dL — ABNORMAL LOW (ref 3.5–5.0)
Alkaline Phosphatase: 36 U/L — ABNORMAL LOW (ref 38–126)
Anion gap: 15 (ref 5–15)
BUN: 37 mg/dL — ABNORMAL HIGH (ref 8–23)
CO2: 29 mmol/L (ref 22–32)
Calcium: 7.8 mg/dL — ABNORMAL LOW (ref 8.9–10.3)
Chloride: 96 mmol/L — ABNORMAL LOW (ref 98–111)
Creatinine, Ser: 6.06 mg/dL — ABNORMAL HIGH (ref 0.61–1.24)
GFR, Estimated: 9 mL/min — ABNORMAL LOW (ref >60)
Glucose, Bld: 108 mg/dL — ABNORMAL HIGH (ref 70–99)
Potassium: 3.2 mmol/L — ABNORMAL LOW (ref 3.5–5.1)
Sodium: 140 mmol/L (ref 135–145)
Total Bilirubin: 0.7 mg/dL (ref 0.3–1.2)
Total Protein: 7.1 g/dL (ref 6.5–8.1)

## 2020-07-11 LAB — CBC WITH DIFFERENTIAL/PLATELET
Abs Immature Granulocytes: 0.03 10*3/uL (ref 0.00–0.07)
Basophils Absolute: 0.1 10*3/uL (ref 0.0–0.1)
Basophils Relative: 1 %
Eosinophils Absolute: 0.2 10*3/uL (ref 0.0–0.5)
Eosinophils Relative: 3 %
HCT: 34.3 % — ABNORMAL LOW (ref 39.0–52.0)
Hemoglobin: 11.6 g/dL — ABNORMAL LOW (ref 13.0–17.0)
Immature Granulocytes: 0 %
Lymphocytes Relative: 19 %
Lymphs Abs: 1.3 10*3/uL (ref 0.7–4.0)
MCH: 33 pg (ref 26.0–34.0)
MCHC: 33.8 g/dL (ref 30.0–36.0)
MCV: 97.7 fL (ref 80.0–100.0)
Monocytes Absolute: 0.8 10*3/uL (ref 0.1–1.0)
Monocytes Relative: 11 %
Neutro Abs: 4.7 10*3/uL (ref 1.7–7.7)
Neutrophils Relative %: 66 %
Platelets: 128 10*3/uL — ABNORMAL LOW (ref 150–400)
RBC: 3.51 MIL/uL — ABNORMAL LOW (ref 4.22–5.81)
RDW: 16.1 % — ABNORMAL HIGH (ref 11.5–15.5)
WBC: 7.1 10*3/uL (ref 4.0–10.5)
nRBC: 0 % (ref 0.0–0.2)

## 2020-07-11 LAB — T4, FREE: Free T4: 1.05 ng/dL (ref 0.61–1.12)

## 2020-07-11 MED ORDER — SODIUM CHLORIDE 0.9 % IV SOLN
200.0000 mg | Freq: Once | INTRAVENOUS | Status: AC
  Administered 2020-07-11: 200 mg via INTRAVENOUS
  Filled 2020-07-11: qty 8

## 2020-07-11 MED ORDER — SODIUM CHLORIDE 0.9 % IV SOLN
Freq: Once | INTRAVENOUS | Status: AC
  Filled 2020-07-11: qty 250

## 2020-07-11 MED ORDER — SODIUM CHLORIDE 0.9% FLUSH
10.0000 mL | INTRAVENOUS | Status: DC | PRN
  Administered 2020-07-11: 10 mL
  Filled 2020-07-11: qty 10

## 2020-07-11 NOTE — Progress Notes (Signed)
Patient denies new problems/concerns today.   °

## 2020-07-11 NOTE — Progress Notes (Signed)
Hematology/Oncology Follow Up Note [facility]  [phone] [phone]  Patient Care Team: [name], MD as PCP - General (Family Medicine) Telford Nab, RN as Oncology Nurse Navigator Earlie Server, MD as Consulting Physician (Hematology and Oncology)   Name of the patient: Jose [name]  [id number]  05/31/1946   REASON FOR VISIT  follow-up for lung cancer   PERTINENT ONCOLOGY HISTORY 75 y.o. with past medical history hemodialysis ESRD, history of prostate cancer, stage IV lung cancer presents for follow-up # Patient has left pleural catheter placed, per patient he has home care nurse checking in and draining the pleural catheter.  He denies any shortness of breath at resting or with exertion. He lives at home with sister.  His nephew sometimes helps him with transportation.  Identified biggest barrier for him for follow-up visits/treatment visits is transportation.  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 Chronic findings include remote infarcts, moderate cerebral atrophy, chronic ischemic changes, Nonocclusive left V4 segment thrombus with slow flow. Moderate narrowing of the bilateral supraclinoid ICAs  He did not come to his appointment on 05/28/2020. 06/05/2020  nasal congestion, chills, fever.  COVID19 positive.  He got monoclonal antibody treatment.    INTERVAL HISTORY Jose [name] is a 75 y.o. male who has above history reviewed by me today presents for follow up visit for management of stage IV lung cancer Problems and complaints are listed below Patient reports feeling well.  Denies any shortness of breath, chest pain. He has good appetite and has lost weight recently.  He reports that his nephew eats all his food.   Review of Systems  Constitutional: Positive for fatigue and unexpected  weight change. Negative for appetite change, chills and fever.  HENT:   Negative for hearing loss and voice change.   Eyes: Negative for eye problems and icterus.  Respiratory: Negative for chest tightness, cough and shortness of breath.   Cardiovascular: Negative for chest pain and leg swelling.  Gastrointestinal: Negative for abdominal distention, abdominal pain and diarrhea.  Endocrine: Negative for hot flashes.  Genitourinary: Negative for difficulty urinating, dysuria and frequency.   Musculoskeletal: Negative for arthralgias.  Skin: Negative for itching and rash.  Neurological: Negative for light-headedness and numbness.  Hematological: Negative for adenopathy. Does not bruise/bleed easily.  Psychiatric/Behavioral: Negative for confusion.       Allergies  Allergen Reactions  . Penicillin G Other (See Comments)    Other reaction(s): Unknown DID THE REACTION INVOLVE: Swelling of the face/tongue/throat, SOB, or low BP? Unknown Sudden or severe rash/hives, skin peeling, or the inside of the mouth or nose? Unknown Did it require medical treatment? Unknown When did it last happen?unknown If all above answers are "NO", may proceed with cephalosporin use.      Past Medical History:  Diagnosis Date  . Chronic kidney disease   . Depression   . Diabetes mellitus without complication ([place])   . Glaucoma   . Glaucoma   . Gout   . Heart murmur   . HLD (hyperlipidemia)   . Hypertension   . Primary adenocarcinoma of left lung ([place])   . Prostate cancer ([place])   . Renal failure    dialysis t/t/s  . Stroke ([place])    tia x 2     Past Surgical History:  Procedure Laterality Date  . AMPUTATION TOE Bilateral  07/23/2018   Procedure: TOE MPJ RIGHT 3RD AND LEFT 2ND;  Surgeon: Samara Deist, DPM;  Location: [facility];  Service: Podiatry;  Laterality: Bilateral;  . APPENDECTOMY    . AV FISTULA PLACEMENT Left 06/09/2018   Procedure: ARTERIOVENOUS (AV) FISTULA CREATION ( BRACHIAL  CEPHALIC);  Surgeon: Algernon Huxley, MD;  Location: [facility];  Service: Vascular;  Laterality: Left;  . CHOLECYSTECTOMY    . DIALYSIS/PERMA CATHETER INSERTION N/A 03/24/2018   Procedure: DIALYSIS/PERMA CATHETER INSERTION;  Surgeon: Katha Cabal, MD;  Location: [place] [facility];  Service: Cardiovascular;  Laterality: N/A;  . DIALYSIS/PERMA CATHETER REMOVAL N/A 09/06/2018   Procedure: DIALYSIS/PERMA CATHETER REMOVAL;  Surgeon: Algernon Huxley, MD;  Location: [place] [facility];  Service: Cardiovascular;  Laterality: N/A;  . INSERTION PROSTATE RADIATION SEED    . IR PERC PLEURAL DRAIN W/INDWELL CATH W/IMG GUIDE  11/03/2019  . IRRIGATION AND DEBRIDEMENT FOOT Right 12/17/2018   Procedure: IRRIGATION AND DEBRIDEMENT FOOT;  Surgeon: Samara Deist, DPM;  Location: [facility];  Service: Podiatry;  Laterality: Right;  . LOWER EXTREMITY ANGIOGRAPHY Right 06/16/2018   Procedure: LOWER EXTREMITY ANGIOGRAPHY;  Surgeon: Algernon Huxley, MD;  Location: [place] [facility];  Service: Cardiovascular;  Laterality: Right;  . LOWER EXTREMITY ANGIOGRAPHY Left 08/30/2018   Procedure: LOWER EXTREMITY ANGIOGRAPHY;  Surgeon: Algernon Huxley, MD;  Location: [place] [facility];  Service: Cardiovascular;  Laterality: Left;  . LOWER EXTREMITY ANGIOGRAPHY Right 09/30/2018   Procedure: LOWER EXTREMITY ANGIOGRAPHY;  Surgeon: Algernon Huxley, MD;  Location: [place] [facility];  Service: Cardiovascular;  Laterality: Right;  . LOWER EXTREMITY ANGIOGRAPHY Left 10/07/2018   Procedure: LOWER EXTREMITY ANGIOGRAPHY;  Surgeon: Algernon Huxley, MD;  Location: [place] [facility];  Service: Cardiovascular;  Laterality: Left;  . LOWER EXTREMITY ANGIOGRAPHY Right 12/20/2018   Procedure: Lower Extremity Angiography;  Surgeon: Algernon Huxley, MD;  Location: [place] [facility];  Service: Cardiovascular;  Laterality: Right;  . TRANSMETATARSAL AMPUTATION Bilateral 10/13/2018   Procedure: 1.  Amputation right great toe MTPJ 2.  Amputation right second toe  MTPJ 3.  Amputation right fourth toe MTPJ 4.  Amputation right fifth toe MTPJ 5.  Excision distal second metatarsal head right foot 6.  Excision distal third metatarsal head right foot 7.  Amputation left third toe 8.  Incision and drainage infectionleft 2nd toe amputation site. ;  Surgeon: Samara Deist, DPM;  Location    Social History   Socioeconomic History  . Marital status: Widowed    Spouse name: Not on file  . Number of children: Not on file  . Years of education: Not on file  . Highest education level: Not on file  Occupational History    Employer: RETIRED  Tobacco Use  . Smoking status: Current Some Day Smoker    Packs/day: 0.10    Types: Cigarettes  . Smokeless tobacco: Never Used  . Tobacco comment: pt currently smokes about 3 cigarettes a day   Vaping Use  . Vaping Use: Never used  Substance and Sexual Activity  . Alcohol use: Not Currently    Alcohol/week: 0.0 standard drinks  . Drug use: Never  . Sexual activity: Not Currently  Other Topics Concern  . Not on file  Social History Narrative  . Not on file   Social Determinants of Health   Financial Resource Strain: Not on file  Food Insecurity: Not on file  Transportation Needs: Not on file  Physical Activity: Not on file  Stress: Not on file  Social Connections: Not on file  Intimate Partner Violence: Not on file    Family History  Problem Relation Age of Onset  . Varicose Veins Neg Hx      Current Outpatient Medications:  .  allopurinol (ZYLOPRIM) 100 MG tablet, Take 1 tablet (100 mg total) by mouth daily., Disp: 30 tablet, Rfl: 2 .  amLODipine (NORVASC) 5 MG tablet, Take 10 mg by mouth daily., Disp: , Rfl:  .  aspirin 81 MG EC tablet, Take 1 tablet (81 mg total) by mouth daily., Disp: 30 tablet, Rfl: 2 .  atorvastatin (LIPITOR) 10 MG tablet, Take 1 tablet (10 mg total) by mouth daily at 6 PM., Disp: 30 tablet, Rfl: 2 .  calcium acetate (PHOSLO) 667 MG capsule, Take 2 capsules (1,334 mg total) by  mouth 3 (three) times daily with meals. (Patient not taking: No sig reported), Disp: 90 capsule, Rfl: 0 .  clopidogrel (PLAVIX) 75 MG tablet, Take 1 tablet (75 mg total) by mouth daily., Disp: 30 tablet, Rfl: 2 .  diphenoxylate-atropine (LOMOTIL) 2.5-0.025 MG tablet, Take 1 tablet by mouth every 12 (twelve) hours as needed for diarrhea or loose stools. (Patient not taking: No sig reported), Disp: 14 tablet, Rfl: 0 .  escitalopram (LEXAPRO) 10 MG tablet, Take 1 tablet (10 mg total) by mouth daily., Disp: 30 tablet, Rfl: 2 .  gabapentin (NEURONTIN) 300 MG capsule, Take 300 mg by mouth 3 (three) times daily., Disp: , Rfl:  .  glipiZIDE (GLUCOTROL) 5 MG tablet, Take by mouth daily before breakfast., Disp: , Rfl:  .  guaiFENesin (MUCINEX) 600 MG 12 hr tablet, Take 1 tablet (600 mg total) by mouth 2 (two) times daily., Disp: 60 tablet, Rfl: 0 .  nicotine (NICODERM CQ - DOSED IN MG/24 HOURS) 21 mg/24hr patch, Place 1 patch (21 mg total) onto the skin daily., Disp: 28 patch, Rfl: 0 .  ondansetron (ZOFRAN) 4 MG tablet, Take 1 tablet (4 mg total) by mouth every 6 (six) hours as needed for nausea., Disp: 20 tablet, Rfl: 0 .  oxyCODONE (OXY IR/ROXICODONE) 5 MG immediate release tablet, Take 1 tablet (5 mg total) by mouth every 6 (six) hours as needed for severe pain. (Patient not taking: No sig reported), Disp: 10 tablet, Rfl: 0 .  tamsulosin (FLOMAX) 0.4 MG CAPS capsule, Take 1 capsule (0.4 mg total) by mouth daily., Disp: 30 capsule, Rfl: 2 .  traMADol (ULTRAM) 50 MG tablet, 50 mg. (Patient not taking: No sig reported), Disp: , Rfl:   Physical exam:  Vitals:   07/11/20 0854  BP: (!) 150/92  Pulse: 76  Resp: 16  Temp: 97.8 F (36.6 C)  Weight: 151 lb 3.2 oz (68.6 kg)   Physical Exam Constitutional:      General: He is not in acute distress.    Appearance: He is not diaphoretic.     Comments: Patient sits in the wheelchair  HENT:     Head: Normocephalic and atraumatic.     Nose: Nose normal.      Mouth/Throat:     Pharynx: No oropharyngeal exudate.  Eyes:     General: No scleral icterus.    Pupils: Pupils are equal, round, and reactive to light.  Cardiovascular:     Rate and Rhythm: Normal rate and regular rhythm.     Heart sounds: No murmur heard.   Pulmonary:     Effort: Pulmonary effort is normal. No respiratory distress.     Breath sounds: No  rales.     Comments: Severely decreased breath sound bilaterally.  Left worse than right Chest:     Chest wall: No tenderness.  Abdominal:     General: There is no distension.     Palpations: Abdomen is soft.     Tenderness: There is no abdominal tenderness.  Musculoskeletal:        General: Normal range of motion.     Cervical back: Normal range of motion and neck supple.  Skin:    General: Skin is warm and dry.     Findings: No erythema.  Neurological:     Mental Status: He is alert and oriented to person, place, and time.     Cranial Nerves: No cranial nerve deficit.     Motor: No abnormal muscle tone.     Coordination: Coordination normal.  Psychiatric:        Mood and Affect: Affect normal.       CMP Latest Ref Rng & Units 06/20/2020  Glucose 70 - 99 mg/dL 183(H)  BUN 8 - 23 mg/dL 57(H)  Creatinine 0.61 - 1.24 mg/dL 8.28(H)  Sodium 135 - 145 mmol/L 136  Potassium 3.5 - 5.1 mmol/L 3.5  Chloride 98 - 111 mmol/L 98  CO2 22 - 32 mmol/L 24  Calcium 8.9 - 10.3 mg/dL 8.9  Total Protein 6.5 - 8.1 g/dL 7.2  Total Bilirubin 0.3 - 1.2 mg/dL 0.4  Alkaline Phos 38 - 126 U/L 30(L)  AST 15 - 41 U/L 23  ALT 0 - 44 U/L 12   CBC Latest Ref Rng & Units 06/20/2020  WBC 4.0 - 10.5 K/uL 6.8  Hemoglobin 13.0 - 17.0 g/dL 11.1(L)  Hematocrit 39.0 - 52.0 % 33.5(L)  Platelets 150 - 400 K/uL 128(L)    RADIOGRAPHIC STUDIES: I have personally reviewed the radiological images as listed and agreed with the findings in the report. No results found.   Assessment and plan  1. Primary adenocarcinoma of left lung ([place])   2.  Encounter for antineoplastic immunotherapy   3. ESRD (end stage renal disease) ([place])   4. 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 Labs are reviewed and discussed with patient. Proceed with Keytruda today. Plan to repeat CT scan to assess treatment response after next cycle of Keytruda treatment.  Cough, likely due to recent COVID 19 infection.  Symptom has improved. ESRD on hemodialysis, continue follow-up with nephrology. Weight loss, continue follow-up with nutritionist.  Continue protein shake on dialysis days. Refer to social work for his family situation.  Follow-up 3 week  for evaluation of diarrhea and Keytruda.   Earlie Server, MD, PhD Hematology Oncology [facility] at [facility] Pager- [phone] 07/11/2020

## 2020-07-11 NOTE — Progress Notes (Signed)
Patient informed Dr Tasia Catchings and CMA, Duwayne Heck, that while he is at Dialysis, his nephew eats all of the food in the home so he does not get to eat when he comes home.  He also reported that his nephew has taken his medications, in the past, but is no longer an issue.  PSN made an adult protective services referral today to the [place]

## 2020-07-11 NOTE — Progress Notes (Signed)
Dr. Tasia Catchings wants to refer patient to social services.  I have contacted Jolyn Lent., and he is going to refer to Adult Protective Services.

## 2020-07-11 NOTE — Progress Notes (Signed)
Keytruda well tolerated. Discharged home in stable condition.

## 2020-08-01 ENCOUNTER — Encounter: Payer: Self-pay | Admitting: Oncology

## 2020-08-01 ENCOUNTER — Inpatient Hospital Stay (HOSPITAL_BASED_OUTPATIENT_CLINIC_OR_DEPARTMENT_OTHER): Payer: Medicare Other | Admitting: Oncology

## 2020-08-01 ENCOUNTER — Inpatient Hospital Stay: Payer: Medicare Other

## 2020-08-01 ENCOUNTER — Inpatient Hospital Stay: Payer: Medicare Other | Attending: Oncology

## 2020-08-01 VITALS — BP 135/97 | HR 66 | Temp 98.1°F | Resp 18 | Wt 151.9 lb

## 2020-08-01 DIAGNOSIS — Z79899 Other long term (current) drug therapy: Secondary | ICD-10-CM | POA: Diagnosis not present

## 2020-08-01 DIAGNOSIS — R634 Abnormal weight loss: Secondary | ICD-10-CM

## 2020-08-01 DIAGNOSIS — Z992 Dependence on renal dialysis: Secondary | ICD-10-CM | POA: Diagnosis not present

## 2020-08-01 DIAGNOSIS — Z7982 Long term (current) use of aspirin: Secondary | ICD-10-CM | POA: Insufficient documentation

## 2020-08-01 DIAGNOSIS — C3492 Malignant neoplasm of unspecified part of left bronchus or lung: Secondary | ICD-10-CM | POA: Diagnosis present

## 2020-08-01 DIAGNOSIS — Z8546 Personal history of malignant neoplasm of prostate: Secondary | ICD-10-CM | POA: Diagnosis not present

## 2020-08-01 DIAGNOSIS — N186 End stage renal disease: Secondary | ICD-10-CM | POA: Insufficient documentation

## 2020-08-01 DIAGNOSIS — Z5112 Encounter for antineoplastic immunotherapy: Secondary | ICD-10-CM | POA: Insufficient documentation

## 2020-08-01 DIAGNOSIS — F17210 Nicotine dependence, cigarettes, uncomplicated: Secondary | ICD-10-CM | POA: Diagnosis not present

## 2020-08-01 LAB — COMPREHENSIVE METABOLIC PANEL
ALT: 13 U/L (ref 0–44)
AST: 20 U/L (ref 15–41)
Albumin: 3.5 g/dL (ref 3.5–5.0)
Alkaline Phosphatase: 31 U/L — ABNORMAL LOW (ref 38–126)
Anion gap: 13 (ref 5–15)
BUN: 35 mg/dL — ABNORMAL HIGH (ref 8–23)
CO2: 29 mmol/L (ref 22–32)
Calcium: 8.2 mg/dL — ABNORMAL LOW (ref 8.9–10.3)
Chloride: 93 mmol/L — ABNORMAL LOW (ref 98–111)
Creatinine, Ser: 6.36 mg/dL — ABNORMAL HIGH (ref 0.61–1.24)
GFR, Estimated: 9 mL/min — ABNORMAL LOW (ref >60)
Glucose, Bld: 102 mg/dL — ABNORMAL HIGH (ref 70–99)
Potassium: 3.4 mmol/L — ABNORMAL LOW (ref 3.5–5.1)
Sodium: 135 mmol/L (ref 135–145)
Total Bilirubin: 0.8 mg/dL (ref 0.3–1.2)
Total Protein: 6.8 g/dL (ref 6.5–8.1)

## 2020-08-01 LAB — TSH: TSH: 8.669 u[IU]/mL — ABNORMAL HIGH (ref 0.350–4.500)

## 2020-08-01 LAB — CBC WITH DIFFERENTIAL/PLATELET
Abs Immature Granulocytes: 0.03 10*3/uL (ref 0.00–0.07)
Basophils Absolute: 0.1 10*3/uL (ref 0.0–0.1)
Basophils Relative: 1 %
Eosinophils Absolute: 0.2 10*3/uL (ref 0.0–0.5)
Eosinophils Relative: 3 %
HCT: 38.5 % — ABNORMAL LOW (ref 39.0–52.0)
Hemoglobin: 12.7 g/dL — ABNORMAL LOW (ref 13.0–17.0)
Immature Granulocytes: 0 %
Lymphocytes Relative: 16 %
Lymphs Abs: 1.1 10*3/uL (ref 0.7–4.0)
MCH: 32.6 pg (ref 26.0–34.0)
MCHC: 33 g/dL (ref 30.0–36.0)
MCV: 99 fL (ref 80.0–100.0)
Monocytes Absolute: 0.7 10*3/uL (ref 0.1–1.0)
Monocytes Relative: 10 %
Neutro Abs: 5 10*3/uL (ref 1.7–7.7)
Neutrophils Relative %: 70 %
Platelets: 133 10*3/uL — ABNORMAL LOW (ref 150–400)
RBC: 3.89 MIL/uL — ABNORMAL LOW (ref 4.22–5.81)
RDW: 15.9 % — ABNORMAL HIGH (ref 11.5–15.5)
WBC: 7.1 10*3/uL (ref 4.0–10.5)
nRBC: 0 % (ref 0.0–0.2)

## 2020-08-01 MED ORDER — SODIUM CHLORIDE 0.9 % IV SOLN
Freq: Once | INTRAVENOUS | Status: AC
  Filled 2020-08-01: qty 250

## 2020-08-01 MED ORDER — TAMSULOSIN HCL 0.4 MG PO CAPS: 0.4000 mg | ORAL_CAPSULE | Freq: Every day | ORAL | 0 refills | Status: AC

## 2020-08-01 MED ORDER — SODIUM CHLORIDE 0.9 % IV SOLN
200.0000 mg | Freq: Once | INTRAVENOUS | Status: AC
  Administered 2020-08-01: 200 mg via INTRAVENOUS
  Filled 2020-08-01: qty 8

## 2020-08-01 NOTE — Progress Notes (Signed)
Pt here for follow up. Pt reports that he is having problems voiding. When he urinates he does not empty completely.

## 2020-08-01 NOTE — Progress Notes (Signed)
Hematology/Oncology Follow Up Note [facility]  [phone] [phone]  Patient Care Team: [name], MD as PCP - General (Family Medicine) Telford Nab, RN as Oncology Nurse Navigator Earlie Server, MD as Consulting Physician (Hematology and Oncology)   Name of the patient: Jose [name]  [id number]  1945/10/30   REASON FOR VISIT  follow-up for lung cancer   PERTINENT ONCOLOGY HISTORY 75 y.o. with past medical history hemodialysis ESRD, history of prostate cancer, stage IV lung cancer presents for follow-up # Patient has left pleural catheter placed, per patient he has home care nurse checking in and draining the pleural catheter.  He denies any shortness of breath at resting or with exertion. He lives at home with sister.  His nephew sometimes helps him with transportation.  Identified biggest barrier for him for follow-up visits/treatment visits is transportation.  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 Chronic findings include remote infarcts, moderate cerebral atrophy, chronic ischemic changes, Nonocclusive left V4 segment thrombus with slow flow. Moderate narrowing of the bilateral supraclinoid ICAs  He did not come to his appointment on 05/28/2020. 06/05/2020  nasal congestion, chills, fever.  COVID19 positive.  He got monoclonal antibody treatment.    INTERVAL HISTORY Jose [name] is a 75 y.o. male who has above history reviewed by me today presents for follow up visit for management of stage IV lung cancer Problems and complaints are listed below Patient reports feeling well. Denies any shortness of breath than his baseline. Reports having difficulty emptying bladder. Denies any dysuria, abdominal pain. Fever or chills. Weight has been stable since last visit.  Review of Systems   Constitutional: Positive for fatigue. Negative for appetite change, chills, fever and unexpected weight change.  HENT:   Negative for hearing loss and voice change.   Eyes: Negative for eye problems and icterus.  Respiratory: Negative for chest tightness, cough and shortness of breath.   Cardiovascular: Negative for chest pain and leg swelling.  Gastrointestinal: Negative for abdominal distention, abdominal pain and diarrhea.  Endocrine: Negative for hot flashes.  Genitourinary: Positive for difficulty urinating. Negative for dysuria and frequency.   Musculoskeletal: Negative for arthralgias.  Skin: Negative for itching and rash.  Neurological: Negative for light-headedness and numbness.  Hematological: Negative for adenopathy. Does not bruise/bleed easily.  Psychiatric/Behavioral: Negative for confusion.       Allergies  Allergen Reactions  . Penicillin G Other (See Comments)    Other reaction(s): Unknown DID THE REACTION INVOLVE: Swelling of the face/tongue/throat, SOB, or low BP? Unknown Sudden or severe rash/hives, skin peeling, or the inside of the mouth or nose? Unknown Did it require medical treatment? Unknown When did it last happen?unknown If all above answers are "NO", may proceed with cephalosporin use.      Past Medical History:  Diagnosis Date  . Chronic kidney disease   . Depression   . Diabetes mellitus without complication ([place])   . Glaucoma   . Glaucoma   . Gout   . Heart murmur   . HLD (hyperlipidemia)   . Hypertension   . Primary adenocarcinoma of left lung ([place])   . Prostate cancer ([place])   . Renal failure    dialysis t/t/s  . 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 AMPUTATION TOE  Bilateral 07/23/2018   Procedure: TOE MPJ RIGHT 3RD AND LEFT 2ND;  Surgeon: Samara Deist, DPM;  Location: [facility];  Service: Podiatry;  Laterality: Bilateral;  . APPENDECTOMY    . AV FISTULA PLACEMENT Left 06/09/2018    Procedure: ARTERIOVENOUS (AV) FISTULA CREATION ( BRACHIAL CEPHALIC);  Surgeon: Algernon Huxley, MD;  Location: [facility];  Service: Vascular;  Laterality: Left;  . CHOLECYSTECTOMY    . DIALYSIS/PERMA CATHETER INSERTION N/A 03/24/2018   Procedure: DIALYSIS/PERMA CATHETER INSERTION;  Surgeon: Katha Cabal, MD;  Location: [place] [facility];  Service: Cardiovascular;  Laterality: N/A;  . DIALYSIS/PERMA CATHETER REMOVAL N/A 09/06/2018   Procedure: DIALYSIS/PERMA CATHETER REMOVAL;  Surgeon: Algernon Huxley, MD;  Location: [place] [facility];  Service: Cardiovascular;  Laterality: N/A;  . INSERTION PROSTATE RADIATION SEED    . IR PERC PLEURAL DRAIN W/INDWELL CATH W/IMG GUIDE  11/03/2019  . IRRIGATION AND DEBRIDEMENT FOOT Right 12/17/2018   Procedure: IRRIGATION AND DEBRIDEMENT FOOT;  Surgeon: Samara Deist, DPM;  Location: [facility];  Service: Podiatry;  Laterality: Right;  . LOWER EXTREMITY ANGIOGRAPHY Right 06/16/2018   Procedure: LOWER EXTREMITY ANGIOGRAPHY;  Surgeon: Algernon Huxley, MD;  Location: [place] [facility];  Service: Cardiovascular;  Laterality: Right;  . LOWER EXTREMITY ANGIOGRAPHY Left 08/30/2018   Procedure: LOWER EXTREMITY ANGIOGRAPHY;  Surgeon: Algernon Huxley, MD;  Location: [place] [facility];  Service: Cardiovascular;  Laterality: Left;  . LOWER EXTREMITY ANGIOGRAPHY Right 09/30/2018   Procedure: LOWER EXTREMITY ANGIOGRAPHY;  Surgeon: Algernon Huxley, MD;  Location: [place] [facility];  Service: Cardiovascular;  Laterality: Right;  . LOWER EXTREMITY ANGIOGRAPHY Left 10/07/2018   Procedure: LOWER EXTREMITY ANGIOGRAPHY;  Surgeon: Algernon Huxley, MD;  Location: [place] [facility];  Service: Cardiovascular;  Laterality: Left;  . LOWER EXTREMITY ANGIOGRAPHY Right 12/20/2018   Procedure: Lower Extremity Angiography;  Surgeon: Algernon Huxley, MD;  Location: [place] [facility];  Service: Cardiovascular;  Laterality: Right;  . TRANSMETATARSAL AMPUTATION Bilateral 10/13/2018   Procedure: 1.   Amputation right great toe MTPJ 2.  Amputation right second toe MTPJ 3.  Amputation right fourth toe MTPJ 4.  Amputation right fifth toe MTPJ 5.  Excision distal second metatarsal head right foot 6.  Excision distal third metatarsal head right foot 7.  Amputation left third toe 8.  Incision and drainage infectionleft 2nd toe amputation site. ;  Surgeon: Samara Deist, DPM;  Location    Social History   Socioeconomic History  . Marital status: Widowed    Spouse name: Not on file  . Number of children: Not on file  . Years of education: Not on file  . Highest education level: Not on file  Occupational History    Employer: RETIRED  Tobacco Use  . Smoking status: Current Some Day Smoker    Packs/day: 0.10    Types: Cigarettes  . Smokeless tobacco: Never Used  . Tobacco comment: pt currently smokes about 3 cigarettes a day   Vaping Use  . Vaping Use: Never used  Substance and Sexual Activity  . Alcohol use: Not Currently    Alcohol/week: 0.0 standard drinks  . Drug use: Never  . Sexual activity: Not Currently  Other Topics Concern  . Not on file  Social History Narrative  . Not on file   Social Determinants of Health   Financial Resource Strain: Not on file  Food Insecurity: Not on file  Transportation Needs: Not on file  Physical Activity: Not on file  Stress: Not on file  Social Connections: Not on file  Intimate Partner Violence: Not on file    Family History  Problem Relation Age of Onset  . Varicose Veins Neg Hx      Current Outpatient Medications:  .  allopurinol (ZYLOPRIM) 100 MG tablet, Take 1 tablet (100 mg total) by mouth daily., Disp: 30 tablet, Rfl: 2 .  amLODipine (NORVASC) 5 MG tablet, Take 10 mg by mouth daily., Disp: , Rfl:  .  aspirin 81 MG EC tablet, Take 1 tablet (81 mg total) by mouth daily., Disp: 30 tablet, Rfl: 2 .  atorvastatin (LIPITOR) 10 MG tablet, Take 1 tablet (10 mg total) by mouth daily at 6 PM., Disp: 30 tablet, Rfl: 2 .  clopidogrel  (PLAVIX) 75 MG tablet, Take 1 tablet (75 mg total) by mouth daily., Disp: 30 tablet, Rfl: 2 .  escitalopram (LEXAPRO) 10 MG tablet, Take 1 tablet (10 mg total) by mouth daily., Disp: 30 tablet, Rfl: 2 .  gabapentin (NEURONTIN) 300 MG capsule, Take 300 mg by mouth 3 (three) times daily., Disp: , Rfl:  .  glipiZIDE (GLUCOTROL) 5 MG tablet, Take by mouth daily before breakfast., Disp: , Rfl:  .  guaiFENesin (MUCINEX) 600 MG 12 hr tablet, Take 1 tablet (600 mg total) by mouth 2 (two) times daily., Disp: 60 tablet, Rfl: 0 .  nicotine (NICODERM CQ - DOSED IN MG/24 HOURS) 21 mg/24hr patch, Place 1 patch (21 mg total) onto the skin daily., Disp: 28 patch, Rfl: 0 .  ondansetron (ZOFRAN) 4 MG tablet, Take 1 tablet (4 mg total) by mouth every 6 (six) hours as needed for nausea., Disp: 20 tablet, Rfl: 0 .  calcium acetate (PHOSLO) 667 MG capsule, Take 2 capsules (1,334 mg total) by mouth 3 (three) times daily with meals. (Patient not taking: No sig reported), Disp: 90 capsule, Rfl: 0 .  diphenoxylate-atropine (LOMOTIL) 2.5-0.025 MG tablet, Take 1 tablet by mouth every 12 (twelve) hours as needed for diarrhea or loose stools. (Patient not taking: No sig reported), Disp: 14 tablet, Rfl: 0 .  oxyCODONE (OXY IR/ROXICODONE) 5 MG immediate release tablet, Take 1 tablet (5 mg total) by mouth every 6 (six) hours as needed for severe pain. (Patient not taking: No sig reported), Disp: 10 tablet, Rfl: 0 .  tamsulosin (FLOMAX) 0.4 MG CAPS capsule, Take 1 capsule (0.4 mg total) by mouth daily., Disp: 30 capsule, Rfl: 0 .  traMADol (ULTRAM) 50 MG tablet, 50 mg. (Patient not taking: No sig reported), Disp: , Rfl:   Physical exam:  Vitals:   08/01/20 0925  BP: (!) 135/97  Pulse: 66  Resp: 18  Temp: 98.1 F (36.7 C)  SpO2: 98%  Weight: 151 lb 14.4 oz (68.9 kg)   Physical Exam Constitutional:      General: He is not in acute distress.    Appearance: He is not diaphoretic.     Comments: Patient sits in the wheelchair   HENT:     Head: Normocephalic and atraumatic.     Nose: Nose normal.     Mouth/Throat:     Pharynx: No oropharyngeal exudate.  Eyes:     General: No scleral icterus.    Pupils: Pupils are equal, round, and reactive to light.  Cardiovascular:     Rate and Rhythm: Normal rate and regular rhythm.     Heart sounds: No murmur heard.   Pulmonary:     Effort: Pulmonary effort is normal. No respiratory distress.  Breath sounds: No rales.     Comments: Severely decreased breath sound bilaterally.  Left worse than right Chest:     Chest wall: No tenderness.  Abdominal:     General: There is no distension.     Palpations: Abdomen is soft.     Tenderness: There is no abdominal tenderness.  Musculoskeletal:        General: Normal range of motion.     Cervical back: Normal range of motion and neck supple.  Skin:    General: Skin is warm and dry.     Findings: No erythema.  Neurological:     Mental Status: He is alert and oriented to person, place, and time.     Cranial Nerves: No cranial nerve deficit.     Motor: No abnormal muscle tone.     Coordination: Coordination normal.  Psychiatric:        Mood and Affect: Affect normal.       CMP Latest Ref Rng & Units 08/01/2020  Glucose 70 - 99 mg/dL 102(H)  BUN 8 - 23 mg/dL 35(H)  Creatinine 0.61 - 1.24 mg/dL 6.36(H)  Sodium 135 - 145 mmol/L 135  Potassium 3.5 - 5.1 mmol/L 3.4(L)  Chloride 98 - 111 mmol/L 93(L)  CO2 22 - 32 mmol/L 29  Calcium 8.9 - 10.3 mg/dL 8.2(L)  Total Protein 6.5 - 8.1 g/dL 6.8  Total Bilirubin 0.3 - 1.2 mg/dL 0.8  Alkaline Phos 38 - 126 U/L 31(L)  AST 15 - 41 U/L 20  ALT 0 - 44 U/L 13   CBC Latest Ref Rng & Units 08/01/2020  WBC 4.0 - 10.5 K/uL 7.1  Hemoglobin 13.0 - 17.0 g/dL 12.7(L)  Hematocrit 39.0 - 52.0 % 38.5(L)  Platelets 150 - 400 K/uL 133(L)    RADIOGRAPHIC STUDIES: I have personally reviewed the radiological images as listed and agreed with the findings in the report. No results  found.   Assessment and plan  1. Primary adenocarcinoma of left lung ([place])   2. Encounter for antineoplastic immunotherapy   3. ESRD (end stage renal disease) ([place])   4. History of prostate cancer   5. 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 Labs are reviewed and are discussed with patient. Proceed with Keytruda treatment today. Obtain CT chest abdomen pelvis with contrast to access treatment response.  ESRD on hemodialysis, continue follow-up with nephrology. Difficulty emptying bladder, history of prostate cancer. Previously on Flomax and currently off. I sent the patient a prescription of Flomax trial. If not improving, he needs to contact primary care provider. I will check PSA at next visit.  Weight loss, continue follow-up with nutritionist.  Continue protein shake on dialysis days. Weight has been stable since last visit.  Follow-up 3 week  for evaluation prior to maintenance Keytruda.   Earlie Server, MD, PhD Hematology Oncology [facility] Pager- [phone] 08/01/2020

## 2020-08-06 ENCOUNTER — Telehealth: Payer: Self-pay

## 2020-08-06 ENCOUNTER — Inpatient Hospital Stay: Payer: Medicare Other

## 2020-08-06 NOTE — Telephone Encounter (Signed)
Nutrition  Unable to reach sister for nutrition phone visit.  Attempted times 2.  No voicemail set up.    Kaio Kuhlman B. Zenia Resides, [place], [place] Registered Dietitian [phone] (mobile)

## 2020-08-14 ENCOUNTER — Telehealth: Payer: Self-pay | Admitting: Nurse Practitioner

## 2020-08-21 ENCOUNTER — Other Ambulatory Visit: Payer: Self-pay

## 2020-08-21 ENCOUNTER — Ambulatory Visit
Admission: RE | Admit: 2020-08-21 | Discharge: 2020-08-21 | Disposition: A | Discharge status: Home / Self Care | Payer: Medicare Other | Source: Ambulatory Visit | Attending: Oncology | Admitting: Oncology

## 2020-08-21 DIAGNOSIS — C3492 Malignant neoplasm of unspecified part of left bronchus or lung: Secondary | ICD-10-CM | POA: Diagnosis present

## 2020-08-21 HISTORY — DX: Disorder of kidney and ureter, unspecified: N28.9

## 2020-08-21 MED ORDER — IOHEXOL 300 MG/ML  SOLN
100.0000 mL | Freq: Once | INTRAMUSCULAR | Status: AC | PRN
  Administered 2020-08-21: 100 mL via INTRAVENOUS

## 2020-08-22 ENCOUNTER — Inpatient Hospital Stay (HOSPITAL_BASED_OUTPATIENT_CLINIC_OR_DEPARTMENT_OTHER): Payer: Medicare Other | Admitting: Oncology

## 2020-08-22 ENCOUNTER — Inpatient Hospital Stay: Payer: Medicare Other

## 2020-08-22 ENCOUNTER — Other Ambulatory Visit: Payer: Self-pay

## 2020-08-22 ENCOUNTER — Encounter: Payer: Self-pay | Admitting: Oncology

## 2020-08-22 VITALS — BP 133/73 | HR 66 | Temp 97.0°F | Resp 18 | Wt 155.0 lb

## 2020-08-22 DIAGNOSIS — Z5112 Encounter for antineoplastic immunotherapy: Secondary | ICD-10-CM | POA: Diagnosis not present

## 2020-08-22 DIAGNOSIS — C3492 Malignant neoplasm of unspecified part of left bronchus or lung: Secondary | ICD-10-CM

## 2020-08-22 DIAGNOSIS — N186 End stage renal disease: Secondary | ICD-10-CM

## 2020-08-22 LAB — CBC WITH DIFFERENTIAL/PLATELET
Abs Immature Granulocytes: 0.03 10*3/uL (ref 0.00–0.07)
Basophils Absolute: 0.1 10*3/uL (ref 0.0–0.1)
Basophils Relative: 1 %
Eosinophils Absolute: 0.2 10*3/uL (ref 0.0–0.5)
Eosinophils Relative: 3 %
HCT: 38.5 % — ABNORMAL LOW (ref 39.0–52.0)
Hemoglobin: 13.2 g/dL (ref 13.0–17.0)
Immature Granulocytes: 1 %
Lymphocytes Relative: 12 %
Lymphs Abs: 0.8 10*3/uL (ref 0.7–4.0)
MCH: 32.8 pg (ref 26.0–34.0)
MCHC: 34.3 g/dL (ref 30.0–36.0)
MCV: 95.8 fL (ref 80.0–100.0)
Monocytes Absolute: 0.5 10*3/uL (ref 0.1–1.0)
Monocytes Relative: 8 %
Neutro Abs: 4.9 10*3/uL (ref 1.7–7.7)
Neutrophils Relative %: 75 %
Platelets: 123 10*3/uL — ABNORMAL LOW (ref 150–400)
RBC: 4.02 MIL/uL — ABNORMAL LOW (ref 4.22–5.81)
RDW: 15.5 % (ref 11.5–15.5)
WBC: 6.4 10*3/uL (ref 4.0–10.5)
nRBC: 0 % (ref 0.0–0.2)

## 2020-08-22 LAB — T4, FREE: Free T4: 1.08 ng/dL (ref 0.61–1.12)

## 2020-08-22 LAB — COMPREHENSIVE METABOLIC PANEL
ALT: 17 U/L (ref 0–44)
AST: 25 U/L (ref 15–41)
Albumin: 3.5 g/dL (ref 3.5–5.0)
Alkaline Phosphatase: 32 U/L — ABNORMAL LOW (ref 38–126)
Anion gap: 13 (ref 5–15)
BUN: 59 mg/dL — ABNORMAL HIGH (ref 8–23)
CO2: 27 mmol/L (ref 22–32)
Calcium: 9.8 mg/dL (ref 8.9–10.3)
Chloride: 90 mmol/L — ABNORMAL LOW (ref 98–111)
Creatinine, Ser: 8.59 mg/dL — ABNORMAL HIGH (ref 0.61–1.24)
GFR, Estimated: 6 mL/min — ABNORMAL LOW (ref >60)
Glucose, Bld: 119 mg/dL — ABNORMAL HIGH (ref 70–99)
Potassium: 3.6 mmol/L (ref 3.5–5.1)
Sodium: 130 mmol/L — ABNORMAL LOW (ref 135–145)
Total Bilirubin: 0.9 mg/dL (ref 0.3–1.2)
Total Protein: 6.8 g/dL (ref 6.5–8.1)

## 2020-08-22 LAB — TSH: TSH: 8.612 u[IU]/mL — ABNORMAL HIGH (ref 0.350–4.500)

## 2020-08-22 MED ORDER — SODIUM CHLORIDE 0.9 % IV SOLN
200.0000 mg | Freq: Once | INTRAVENOUS | Status: AC
  Administered 2020-08-22: 200 mg via INTRAVENOUS
  Filled 2020-08-22: qty 8

## 2020-08-22 MED ORDER — SODIUM CHLORIDE 0.9 % IV SOLN
Freq: Once | INTRAVENOUS | Status: AC
  Filled 2020-08-22: qty 250

## 2020-08-22 NOTE — Progress Notes (Signed)
Hematology/Oncology Follow Up Note [facility]  [phone] [phone]  Patient Care Team: [name], MD as PCP - General (Family Medicine) Telford Nab, RN as Oncology Nurse Navigator Earlie Server, MD as Consulting Physician (Hematology and Oncology)   Name of the patient: Jose [name]  [id number]  01-20-1946   REASON FOR VISIT  follow-up for lung cancer   PERTINENT ONCOLOGY HISTORY 75 y.o. with past medical history hemodialysis ESRD, history of prostate cancer, stage IV lung cancer presents for follow-up # Patient has left pleural catheter placed, per patient he has home care nurse checking in and draining the pleural catheter.  He denies any shortness of breath at resting or with exertion. He lives at home with sister.  His nephew sometimes helps him with transportation.  Identified biggest barrier for him for follow-up visits/treatment visits is transportation.  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 Chronic findings include remote infarcts, moderate cerebral atrophy, chronic ischemic changes, Nonocclusive left V4 segment thrombus with slow flow. Moderate narrowing of the bilateral supraclinoid ICAs  He did not come to his appointment on 05/28/2020. 06/05/2020  nasal congestion, chills, fever.  COVID19 positive.  He got monoclonal antibody treatment.    INTERVAL HISTORY Jose [name] is a 75 y.o. male who has above history reviewed by me today presents for follow up visit for management of stage IV lung cancer Problems and complaints are listed below Patient reports feeling tired.  Denies any shortness of breath. He reports not having much urine output.  Denies any dysuria, abdominal pain.  Fever or chills  Review of Systems  Constitutional: Positive for fatigue. Negative for appetite change,  chills, fever and unexpected weight change.  HENT:   Negative for hearing loss and voice change.   Eyes: Negative for eye problems and icterus.  Respiratory: Negative for chest tightness, cough and shortness of breath.   Cardiovascular: Negative for chest pain and leg swelling.  Gastrointestinal: Negative for abdominal distention, abdominal pain and diarrhea.  Endocrine: Negative for hot flashes.  Genitourinary: Positive for difficulty urinating. Negative for dysuria and frequency.   Musculoskeletal: Negative for arthralgias.  Skin: Negative for itching and rash.  Neurological: Negative for light-headedness and numbness.  Hematological: Negative for adenopathy. Does not bruise/bleed easily.  Psychiatric/Behavioral: Negative for confusion.       Allergies  Allergen Reactions  . Penicillin G Other (See Comments)    Other reaction(s): Unknown DID THE REACTION INVOLVE: Swelling of the face/tongue/throat, SOB, or low BP? Unknown Sudden or severe rash/hives, skin peeling, or the inside of the mouth or nose? Unknown Did it require medical treatment? Unknown When did it last happen?unknown If all above answers are "NO", may proceed with cephalosporin use.      Past Medical History:  Diagnosis Date  . Chronic kidney disease   . Depression   . Diabetes mellitus without complication ([place])   . Glaucoma   . Glaucoma   . Gout   . Heart murmur   . HLD (hyperlipidemia)   . Hypertension   . Primary adenocarcinoma of left lung ([place])   . Prostate cancer ([place])   . Renal failure    dialysis t/t/s  . Renal insufficiency   . Stroke ([place])    tia x 2     Past Surgical History:  Procedure Laterality Date  . AMPUTATION TOE  Bilateral 07/23/2018   Procedure: TOE MPJ RIGHT 3RD AND LEFT 2ND;  Surgeon: Samara Deist, DPM;  Location: [facility];  Service: Podiatry;  Laterality: Bilateral;  . APPENDECTOMY    . AV FISTULA PLACEMENT Left 06/09/2018   Procedure: ARTERIOVENOUS (AV) FISTULA CREATION (  BRACHIAL CEPHALIC);  Surgeon: Algernon Huxley, MD;  Location: [facility];  Service: Vascular;  Laterality: Left;  . CHOLECYSTECTOMY    . DIALYSIS/PERMA CATHETER INSERTION N/A 03/24/2018   Procedure: DIALYSIS/PERMA CATHETER INSERTION;  Surgeon: Katha Cabal, MD;  Location: [place] [facility];  Service: Cardiovascular;  Laterality: N/A;  . DIALYSIS/PERMA CATHETER REMOVAL N/A 09/06/2018   Procedure: DIALYSIS/PERMA CATHETER REMOVAL;  Surgeon: Algernon Huxley, MD;  Location: [place] [facility];  Service: Cardiovascular;  Laterality: N/A;  . INSERTION PROSTATE RADIATION SEED    . IR PERC PLEURAL DRAIN W/INDWELL CATH W/IMG GUIDE  11/03/2019  . IRRIGATION AND DEBRIDEMENT FOOT Right 12/17/2018   Procedure: IRRIGATION AND DEBRIDEMENT FOOT;  Surgeon: Samara Deist, DPM;  Location: [facility];  Service: Podiatry;  Laterality: Right;  . LOWER EXTREMITY ANGIOGRAPHY Right 06/16/2018   Procedure: LOWER EXTREMITY ANGIOGRAPHY;  Surgeon: Algernon Huxley, MD;  Location: [place] [facility];  Service: Cardiovascular;  Laterality: Right;  . LOWER EXTREMITY ANGIOGRAPHY Left 08/30/2018   Procedure: LOWER EXTREMITY ANGIOGRAPHY;  Surgeon: Algernon Huxley, MD;  Location: [place] [facility];  Service: Cardiovascular;  Laterality: Left;  . LOWER EXTREMITY ANGIOGRAPHY Right 09/30/2018   Procedure: LOWER EXTREMITY ANGIOGRAPHY;  Surgeon: Algernon Huxley, MD;  Location: [place] [facility];  Service: Cardiovascular;  Laterality: Right;  . LOWER EXTREMITY ANGIOGRAPHY Left 10/07/2018   Procedure: LOWER EXTREMITY ANGIOGRAPHY;  Surgeon: Algernon Huxley, MD;  Location: [place] [facility];  Service: Cardiovascular;  Laterality: Left;  . LOWER EXTREMITY ANGIOGRAPHY Right 12/20/2018   Procedure: Lower Extremity Angiography;  Surgeon: Algernon Huxley, MD;  Location: [place] [facility];  Service: Cardiovascular;  Laterality: Right;  . TRANSMETATARSAL AMPUTATION Bilateral 10/13/2018   Procedure: 1.  Amputation right great toe MTPJ 2.  Amputation right  second toe MTPJ 3.  Amputation right fourth toe MTPJ 4.  Amputation right fifth toe MTPJ 5.  Excision distal second metatarsal head right foot 6.  Excision distal third metatarsal head right foot 7.  Amputation left third toe 8.  Incision and drainage infectionleft 2nd toe amputation site. ;  Surgeon: Samara Deist, DPM;  Location    Social History   Socioeconomic History  . Marital status: Widowed    Spouse name: Not on file  . Number of children: Not on file  . Years of education: Not on file  . Highest education level: Not on file  Occupational History    Employer: RETIRED  Tobacco Use  . Smoking status: Current Some Day Smoker    Packs/day: 0.10    Types: Cigarettes  . Smokeless tobacco: Never Used  . Tobacco comment: pt currently smokes about 3 cigarettes a day   Vaping Use  . Vaping Use: Never used  Substance and Sexual Activity  . Alcohol use: Not Currently    Alcohol/week: 0.0 standard drinks  . Drug use: Never  . Sexual activity: Not Currently  Other Topics Concern  . Not on file  Social History Narrative  . Not on file   Social Determinants of Health   Financial Resource Strain: Not on file  Food Insecurity: Not on file  Transportation Needs: Not on file  Physical Activity: Not on file  Stress: Not on file  Social Connections: Not on file  Intimate Partner Violence: Not on file    Family History  Problem Relation Age of Onset  . Varicose Veins Neg Hx      Current Outpatient Medications:  .  allopurinol (ZYLOPRIM) 100 MG tablet, Take 1 tablet (100 mg total) by mouth daily., Disp: 30 tablet, Rfl: 2 .  amLODipine (NORVASC) 5 MG tablet, Take 10 mg by mouth daily., Disp: , Rfl:  .  aspirin 81 MG EC tablet, Take 1 tablet (81 mg total) by mouth daily., Disp: 30 tablet, Rfl: 2 .  atorvastatin (LIPITOR) 10 MG tablet, Take 1 tablet (10 mg total) by mouth daily at 6 PM., Disp: 30 tablet, Rfl: 2 .  calcium acetate (PHOSLO) 667 MG capsule, Take 2 capsules (1,334 mg  total) by mouth 3 (three) times daily with meals. (Patient not taking: No sig reported), Disp: 90 capsule, Rfl: 0 .  clopidogrel (PLAVIX) 75 MG tablet, Take 1 tablet (75 mg total) by mouth daily., Disp: 30 tablet, Rfl: 2 .  diphenoxylate-atropine (LOMOTIL) 2.5-0.025 MG tablet, Take 1 tablet by mouth every 12 (twelve) hours as needed for diarrhea or loose stools. (Patient not taking: No sig reported), Disp: 14 tablet, Rfl: 0 .  escitalopram (LEXAPRO) 10 MG tablet, Take 1 tablet (10 mg total) by mouth daily., Disp: 30 tablet, Rfl: 2 .  gabapentin (NEURONTIN) 300 MG capsule, Take 300 mg by mouth 3 (three) times daily., Disp: , Rfl:  .  glipiZIDE (GLUCOTROL) 5 MG tablet, Take by mouth daily before breakfast., Disp: , Rfl:  .  guaiFENesin (MUCINEX) 600 MG 12 hr tablet, Take 1 tablet (600 mg total) by mouth 2 (two) times daily., Disp: 60 tablet, Rfl: 0 .  nicotine (NICODERM CQ - DOSED IN MG/24 HOURS) 21 mg/24hr patch, Place 1 patch (21 mg total) onto the skin daily., Disp: 28 patch, Rfl: 0 .  ondansetron (ZOFRAN) 4 MG tablet, Take 1 tablet (4 mg total) by mouth every 6 (six) hours as needed for nausea., Disp: 20 tablet, Rfl: 0 .  oxyCODONE (OXY IR/ROXICODONE) 5 MG immediate release tablet, Take 1 tablet (5 mg total) by mouth every 6 (six) hours as needed for severe pain. (Patient not taking: No sig reported), Disp: 10 tablet, Rfl: 0 .  tamsulosin (FLOMAX) 0.4 MG CAPS capsule, Take 1 capsule (0.4 mg total) by mouth daily., Disp: 30 capsule, Rfl: 0 .  traMADol (ULTRAM) 50 MG tablet, 50 mg. (Patient not taking: No sig reported), Disp: , Rfl:   Physical exam:  Vitals:   08/22/20 0931  BP: 133/73  Pulse: 66  Resp: 18  Temp: (!) 97 F (36.1 C)  SpO2: 96%  Weight: 155 lb (70.3 kg)   Physical Exam Constitutional:      General: He is not in acute distress.    Appearance: He is not diaphoretic.     Comments: Patient sits in the wheelchair  HENT:     Head: Normocephalic and atraumatic.     Nose: Nose  normal.     Mouth/Throat:     Pharynx: No oropharyngeal exudate.  Eyes:     General: No scleral icterus.    Pupils: Pupils are equal, round, and reactive to light.  Cardiovascular:     Rate and Rhythm: Normal rate and regular rhythm.     Heart sounds: No murmur heard.   Pulmonary:     Effort: Pulmonary effort is normal. No respiratory distress.     Breath sounds:  No rales.     Comments: Severely decreased breath sound bilaterally.  Left worse than right Chest:     Chest wall: No tenderness.  Abdominal:     General: There is no distension.     Palpations: Abdomen is soft.     Tenderness: There is no abdominal tenderness.  Musculoskeletal:        General: Normal range of motion.     Cervical back: Normal range of motion and neck supple.  Skin:    General: Skin is warm and dry.     Findings: No erythema.  Neurological:     Mental Status: He is alert and oriented to person, place, and time.     Cranial Nerves: No cranial nerve deficit.     Motor: No abnormal muscle tone.     Coordination: Coordination normal.  Psychiatric:        Mood and Affect: Affect normal.       CMP Latest Ref Rng & Units 08/22/2020  Glucose 70 - 99 mg/dL 119(H)  BUN 8 - 23 mg/dL 59(H)  Creatinine 0.61 - 1.24 mg/dL 8.59(H)  Sodium 135 - 145 mmol/L 130(L)  Potassium 3.5 - 5.1 mmol/L 3.6  Chloride 98 - 111 mmol/L 90(L)  CO2 22 - 32 mmol/L 27  Calcium 8.9 - 10.3 mg/dL 9.8  Total Protein 6.5 - 8.1 g/dL 6.8  Total Bilirubin 0.3 - 1.2 mg/dL 0.9  Alkaline Phos 38 - 126 U/L 32(L)  AST 15 - 41 U/L 25  ALT 0 - 44 U/L 17   CBC Latest Ref Rng & Units 08/22/2020  WBC 4.0 - 10.5 K/uL 6.4  Hemoglobin 13.0 - 17.0 g/dL 13.2  Hematocrit 39.0 - 52.0 % 38.5(L)  Platelets 150 - 400 K/uL 123(L)    RADIOGRAPHIC STUDIES: I have personally reviewed the radiological images as listed and agreed with the findings in the report. CT CHEST ABDOMEN PELVIS W CONTRAST  Result Date: 08/22/2020 CLINICAL DATA:  Restaging  of left lung adenocarcinoma with malignant pleural effusion. Immunotherapy treatment with Keytruda. EXAM: CT CHEST, ABDOMEN, AND PELVIS WITH CONTRAST TECHNIQUE: Multidetector CT imaging of the chest, abdomen and pelvis was performed following the standard protocol during bolus administration of intravenous contrast. CONTRAST:  148m OMNIPAQUE IOHEXOL 300 MG/ML  SOLN COMPARISON:  Multiple exams, including 04/30/2020 FINDINGS: CT CHEST FINDINGS Cardiovascular: Coronary, aortic arch, and branch vessel atherosclerotic vascular disease. Mild cardiomegaly. Small pericardial effusion. 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 Focal outpouching of the left lateral wall of the thoracic aorta on image 26 series 2 suggesting a chronic small saccular aneurysm. Chronic web like filling defect / irregularity in the left lower lobe pulmonary artery on image 86 series 5, no change from prior, likely residuum from remote pulmonary embolus. Mediastinum/Nodes: Right paratracheal lymph node 2.4 cm in short axis on image 21 series 2, formerly 1.5 cm. Right paratracheal node 0.8 cm in short axis on image 11 series 2, previously 0.4 cm. Subcarinal node 1.9 cm in short axis on image 30 series 2, formerly 1.3 cm. Left infrahilar node 1.2 cm in short axis on image 29 series 2, formerly 0.7 cm. Left anterior pericardial lymph node 1.1 cm in short axis on image 54 series 2, previously 1.5 cm. Lungs/Pleura: Small left pleural effusion with suspected associated mild enhancement. Spiculated left upper lobe pulmonary nodule 2.5 by 2.0 cm on image 24 series 4, previously 2.3 by 1.8 cm when measured  in a similar fashion. Centrilobular and paraseptal emphysema. Stable scarring along the minor fissure. Stable scarring in the posterior basal segment right lower lobe. Calcified granuloma posteriorly in the right upper lobe. Increased bilobed pleural nodularity anteriorly along  the left upper lobe measuring 0.8 by 0.3 cm on image 40 series 4. Musculoskeletal: Dextroconvex thoracic scoliosis. CT ABDOMEN PELVIS FINDINGS Hepatobiliary: Unremarkable Pancreas: Unremarkable Spleen: Unremarkable Adrenals/Urinary Tract: Indistinct fullness of both adrenal glands partially blurred by motion artifact. Bilateral renal atrophy. Left mid kidney cyst. Thickened right posterior urinary bladder wall, no change from prior, sessile tumor not excluded. Stomach/Bowel: Descending and sigmoid colon diverticulosis. A margin of small bowel extends into a small direct right inguinal hernia without complicating feature. Vascular/Lymphatic: Aortoiliac atherosclerotic vascular disease. Fusiform infrarenal abdominal aortic aneurysm 3.4 cm in diameter, previously measured at 3.3 cm. Right gastric lymph node 1.1 cm in short axis on image 61 series 2, formerly not well seen. Scattered upper normal sized left periaortic lymph nodes. Reproductive: Brachytherapy seed implants in the prostate gland. As before, gas density from the anorectal region extends further anteriorly into the vicinity of the prostate that would otherwise be expected. Other: No supplemental non-categorized findings. Musculoskeletal: There is a suggestion of minimal bilateral chronic hip avascular necrosis with sclerosis anteriorly in both femoral heads. Lumbar spondylosis and degenerative disc disease causing bilateral foraminal impingement at L5-S1. IMPRESSION: 1. Enlarging left upper lobe nodule and worsening adenopathy in the chest and upper abdomen, compatible with progressive malignancy. 2. Infrarenal abdominal aortic aneurysm 3.4 cm in diameter. Recommend follow-up ultrasound every 3 years. This recommendation follows ACR consensus guidelines: White Paper of the ACR Incidental Findings Committee II on Vascular Findings. J Am Coll Radiol 2013; 10:789-794. 3. Considerable atherosclerosis with chronic stable findings in the chest including a small  focal dissection at the origin of the left common carotid artery, and an outpouching of the left lateral side of the descending thoracic aorta potentially from saccular aneurysm or chronic pseudoaneurysm. There is also a web like residuum from prior remote pulmonary embolus in the left lower lobe. No acute pulmonary embolus. 4. Complex small left pleural effusion, likely malignant. 5. Thickened right posterior urinary bladder wall, stable, sessile tumor not excluded. 6. Stable gas density along the anterior anorectal margin extending into the vicinity the prostate gland, significance uncertain. Brachytherapy seeds are present in the prostate gland. 7. Other imaging findings of potential clinical significance: Aortic Atherosclerosis (ICD10-I70.0). Coronary atherosclerosis. Mild cardiomegaly. Emphysema (ICD10-J43.9). Descending and sigmoid colon diverticulosis. Chronic bilateral hip avascular necrosis (mild). Bilateral foraminal impingement at L5-S1. Small direct right inguinal hernia appears stable and contains a small loop of small bowel without complicating feature. Electronically Signed   By: Van Clines M.D.   On: 08/22/2020 09:49     Assessment and plan  1. Primary adenocarcinoma of left lung ([place])   2. Encounter for antineoplastic immunotherapy   3. 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  Proceed with Keytruda today. CT chest abdomen pelvis results were available after his visit today. Images were independently reviewed by me.  Enlarging left upper lobe nodule and worsening adenopathy in the chest, upper abdomen.  Compatible with progressive malignancy.  Infra abdominal aortic aneurysm 3.4 cm.  Atherosclerosis.  Complex small left pleural effusion. Thickened right posterior urinary bladder wall, stable, Stable gas density along the anterior anorectal margin extending into the vicinity the  prostate gland, significance uncertain.Brachytherapy seeds  are present in the prostate gland  I discussed with patient about need of adding chemotherapy carboplatin Taxol to [facility].   #Fatigue, possibly due to multiple comorbidities.  Check TSH and free T4 ESRD on hemodialysis, continue follow-up with nephrology. Difficulty emptying bladder, history of prostate cancer.  Continue Flomax.  Weight loss, continue follow-up with nutritionist.  Continue protein shake on dialysis days. He has gained weight  Follow-up 3 week  for evaluation prior to lab MD carbo/Taxol/Keytruda.   Earlie Server, MD, PhD Hematology Oncology [facility] at [facility] Pager- [phone] 08/22/2020

## 2020-08-22 NOTE — Progress Notes (Signed)
Patient here for follow up. Pt reports feeling weakness. Pt could not tell me what medications he is taking/ not taking.

## 2020-08-27 ENCOUNTER — Telehealth: Payer: Self-pay | Admitting: *Deleted

## 2020-08-27 NOTE — Telephone Encounter (Signed)
FYI.Marland KitchenMarland KitchenMarland KitchenMarland Kitchen   Pt has been sched as requested  He's sched to RTC on 08/31/20 @ 1:30 for MD visit only for discussion.  I spoke with his sister [name] and made her aware of his sched Lucianne Lei pick-up/MD In Person appt times. She stated that he lives with her, so she would make him aware.

## 2020-08-30 ENCOUNTER — Emergency Department: Payer: Medicare Other

## 2020-08-30 ENCOUNTER — Other Ambulatory Visit: Payer: Self-pay

## 2020-08-30 ENCOUNTER — Emergency Department
Admission: EM | Admit: 2020-08-30 | Discharge: 2020-08-30 | Disposition: A | Discharge status: Home / Self Care | Payer: Medicare Other | Source: Home / Self Care | Attending: Emergency Medicine | Admitting: Emergency Medicine

## 2020-08-30 ENCOUNTER — Encounter: Payer: Self-pay | Admitting: *Deleted

## 2020-08-30 DIAGNOSIS — R531 Weakness: Secondary | ICD-10-CM | POA: Diagnosis not present

## 2020-08-30 DIAGNOSIS — Z7982 Long term (current) use of aspirin: Secondary | ICD-10-CM | POA: Insufficient documentation

## 2020-08-30 DIAGNOSIS — Z992 Dependence on renal dialysis: Secondary | ICD-10-CM | POA: Insufficient documentation

## 2020-08-30 DIAGNOSIS — I5032 Chronic diastolic (congestive) heart failure: Secondary | ICD-10-CM | POA: Insufficient documentation

## 2020-08-30 DIAGNOSIS — Z7984 Long term (current) use of oral hypoglycemic drugs: Secondary | ICD-10-CM | POA: Insufficient documentation

## 2020-08-30 DIAGNOSIS — E1122 Type 2 diabetes mellitus with diabetic chronic kidney disease: Secondary | ICD-10-CM | POA: Insufficient documentation

## 2020-08-30 DIAGNOSIS — Z79899 Other long term (current) drug therapy: Secondary | ICD-10-CM | POA: Insufficient documentation

## 2020-08-30 DIAGNOSIS — Z85118 Personal history of other malignant neoplasm of bronchus and lung: Secondary | ICD-10-CM | POA: Insufficient documentation

## 2020-08-30 DIAGNOSIS — I132 Hypertensive heart and chronic kidney disease with heart failure and with stage 5 chronic kidney disease, or end stage renal disease: Secondary | ICD-10-CM | POA: Insufficient documentation

## 2020-08-30 DIAGNOSIS — I447 Left bundle-branch block, unspecified: Secondary | ICD-10-CM | POA: Insufficient documentation

## 2020-08-30 DIAGNOSIS — I509 Heart failure, unspecified: Secondary | ICD-10-CM

## 2020-08-30 DIAGNOSIS — Z8673 Personal history of transient ischemic attack (TIA), and cerebral infarction without residual deficits: Secondary | ICD-10-CM | POA: Insufficient documentation

## 2020-08-30 DIAGNOSIS — R7989 Other specified abnormal findings of blood chemistry: Secondary | ICD-10-CM | POA: Insufficient documentation

## 2020-08-30 DIAGNOSIS — F17210 Nicotine dependence, cigarettes, uncomplicated: Secondary | ICD-10-CM | POA: Insufficient documentation

## 2020-08-30 DIAGNOSIS — Z7902 Long term (current) use of antithrombotics/antiplatelets: Secondary | ICD-10-CM | POA: Insufficient documentation

## 2020-08-30 DIAGNOSIS — E8770 Fluid overload, unspecified: Secondary | ICD-10-CM | POA: Diagnosis not present

## 2020-08-30 DIAGNOSIS — Z8546 Personal history of malignant neoplasm of prostate: Secondary | ICD-10-CM | POA: Insufficient documentation

## 2020-08-30 DIAGNOSIS — N186 End stage renal disease: Secondary | ICD-10-CM | POA: Insufficient documentation

## 2020-08-30 LAB — TROPONIN I (HIGH SENSITIVITY)
Troponin I (High Sensitivity): 27 ng/L — ABNORMAL HIGH (ref <18)
Troponin I (High Sensitivity): 27 ng/L — ABNORMAL HIGH (ref <18)

## 2020-08-30 LAB — CBC WITH DIFFERENTIAL/PLATELET
Abs Immature Granulocytes: 0.06 10*3/uL (ref 0.00–0.07)
Basophils Absolute: 0.1 10*3/uL (ref 0.0–0.1)
Basophils Relative: 1 %
Eosinophils Absolute: 0.1 10*3/uL (ref 0.0–0.5)
Eosinophils Relative: 2 %
HCT: 41.3 % (ref 39.0–52.0)
Hemoglobin: 13.8 g/dL (ref 13.0–17.0)
Immature Granulocytes: 1 %
Lymphocytes Relative: 12 %
Lymphs Abs: 1.1 10*3/uL (ref 0.7–4.0)
MCH: 32.2 pg (ref 26.0–34.0)
MCHC: 33.4 g/dL (ref 30.0–36.0)
MCV: 96.5 fL (ref 80.0–100.0)
Monocytes Absolute: 0.8 10*3/uL (ref 0.1–1.0)
Monocytes Relative: 9 %
Neutro Abs: 6.8 10*3/uL (ref 1.7–7.7)
Neutrophils Relative %: 75 %
Platelets: 138 10*3/uL — ABNORMAL LOW (ref 150–400)
RBC: 4.28 MIL/uL (ref 4.22–5.81)
RDW: 15.9 % — ABNORMAL HIGH (ref 11.5–15.5)
WBC: 8.9 10*3/uL (ref 4.0–10.5)
nRBC: 0.2 % (ref 0.0–0.2)

## 2020-08-30 LAB — BRAIN NATRIURETIC PEPTIDE: B Natriuretic Peptide: >4500 pg/mL — ABNORMAL HIGH (ref 0.0–100.0)

## 2020-08-30 LAB — COMPREHENSIVE METABOLIC PANEL
ALT: 13 U/L (ref 0–44)
AST: 19 U/L (ref 15–41)
Albumin: 3.5 g/dL (ref 3.5–5.0)
Alkaline Phosphatase: 36 U/L — ABNORMAL LOW (ref 38–126)
Anion gap: 13 (ref 5–15)
BUN: 20 mg/dL (ref 8–23)
CO2: 29 mmol/L (ref 22–32)
Calcium: 9 mg/dL (ref 8.9–10.3)
Chloride: 94 mmol/L — ABNORMAL LOW (ref 98–111)
Creatinine, Ser: 5.32 mg/dL — ABNORMAL HIGH (ref 0.61–1.24)
GFR, Estimated: 11 mL/min — ABNORMAL LOW (ref >60)
Glucose, Bld: 89 mg/dL (ref 70–99)
Potassium: 3.6 mmol/L (ref 3.5–5.1)
Sodium: 136 mmol/L (ref 135–145)
Total Bilirubin: 1.2 mg/dL (ref 0.3–1.2)
Total Protein: 6.9 g/dL (ref 6.5–8.1)

## 2020-08-30 MED ORDER — FUROSEMIDE 10 MG/ML IJ SOLN
40.0000 mg | Freq: Once | INTRAMUSCULAR | Status: AC
  Administered 2020-08-30: 40 mg via INTRAVENOUS
  Filled 2020-08-30: qty 4

## 2020-08-30 NOTE — ED Notes (Signed)
ED Provider at bedside. 

## 2020-08-30 NOTE — Discharge Instructions (Addendum)
Your CT scan and X ray show fluid in the lungs but no pneumonia.  The medicine given today should help get some of this fluid out.  You should follow-up with your primary care doctor, kidney doctor, and cancer doctor.  Return to the ER for new, worsening, or persistent severe weakness, cough, shortness of breath, chest pain, or any other new or worsening symptoms that concern you.

## 2020-08-30 NOTE — ED Notes (Signed)
Patient is resting comfortably. 

## 2020-08-30 NOTE — ED Triage Notes (Signed)
Generalized weakness x2 weeks. Presents to ED from dialysis, did receive treatment today.

## 2020-08-30 NOTE — ED Notes (Signed)
Patient returned to [place] from [place].

## 2020-08-30 NOTE — ED Notes (Signed)
Unable to obtain taxi cab for patient discharge. Patrick Jupiter, patient's friend contact for a ride home. Patient discharged to lobby with his walker, and discharge papers.

## 2020-08-30 NOTE — ED Notes (Signed)
Patient transported to CT 

## 2020-08-30 NOTE — ED Notes (Signed)
Family, [place], updated by this RN with verbal consent from patient.

## 2020-08-30 NOTE — ED Provider Notes (Signed)
[facility] Emergency Department Provider Note ____________________________________________   Event Date/Time   First MD Initiated Contact with Patient 08/30/20 1626     (approximate)  I have reviewed the triage vital signs and the nursing notes.   HISTORY  Chief Complaint Weakness    HPI Jose [name] is a 75 y.o. male with PMH as noted below including ESRD on dialysis as well as lung cancer presents with generalized weakness over the last few weeks, gradual onset, persistent course.  The patient had dialysis earlier today although he states he does not feel any weaker after this.  He reports associated cough productive of some phlegm with irritation to the throat, but no shortness of breath or chest pain.  He denies any vomiting, diarrhea, or abdominal pain.   Past Medical History:  Diagnosis Date  . Chronic kidney disease   . Depression   . Diabetes mellitus without complication ([place])   . Glaucoma   . Glaucoma   . Gout   . Heart murmur   . HLD (hyperlipidemia)   . Hypertension   . Primary adenocarcinoma of left lung ([place])   . Prostate cancer ([place])   . Renal failure    dialysis t/t/s  . Renal insufficiency   . Stroke ([place])    tia x 2    Patient Active Problem List   Diagnosis Date Noted  . Weight loss 07/11/2020  . Encounter for antineoplastic immunotherapy 02/15/2020  . Goals of care, counseling/discussion 12/23/2019  . 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 Malignant neoplasm of upper lobe of left lung ([place]) 12/23/2019  . Thickening of wall of gallbladder   . Primary adenocarcinoma of left lung ([place])   . Hypokalemia 10/30/2019  . Chronic diastolic CHF (congestive heart failure) ([place]) 10/30/2019  . Benign essential HTN 10/30/2019  . Malignant neoplasm of unknown origin ([place]) 10/30/2019  . Acute respiratory failure with hypoxia ([place]) 10/30/2019  . End-stage renal disease on hemodialysis ([place]) 10/30/2019  . Diabetes mellitus type  2, controlled, with complications ([place]) 09/98/3382  . SOB (shortness of breath) 10/29/2019  . Pulmonary edema 10/22/2019  . Diarrhea 10/22/2019  . Acute on chronic diastolic CHF (congestive heart failure) ([place]) 10/22/2019  . HLD (hyperlipidemia)   . Stroke ([place])   . Depression   . Tobacco abuse   . Pleural effusion on left   . Acute urinary retention   . Foot ulcer ([place]) 12/15/2018  . Ischemic foot 10/11/2018  . Atherosclerosis of native arteries of the extremities with gangrene ([place]) 07/09/2018  . Gangrene of toe of right foot ([place]) 06/13/2018  . Type II diabetes mellitus with renal manifestations ([place]) 04/16/2018  . Hypertension 04/16/2018  . ESRD on dialysis ([place]) 04/16/2018  . Malnutrition of moderate degree 03/27/2018  . Renal failure 03/21/2018    Past Surgical History:  Procedure Laterality Date  . AMPUTATION TOE Bilateral 07/23/2018   Procedure: TOE MPJ RIGHT 3RD AND LEFT 2ND;  Surgeon: Samara Deist, DPM;  Location: [facility] ORS;  Service: Podiatry;  Laterality: Bilateral;  . APPENDECTOMY    . AV FISTULA PLACEMENT Left 06/09/2018   Procedure: ARTERIOVENOUS (AV) FISTULA CREATION ( BRACHIAL CEPHALIC);  Surgeon: Algernon Huxley, MD;  Location: [facility] ORS;  Service: Vascular;  Laterality: Left;  . CHOLECYSTECTOMY    . DIALYSIS/PERMA CATHETER INSERTION N/A 03/24/2018   Procedure: DIALYSIS/PERMA CATHETER INSERTION;  Surgeon: Katha Cabal, MD;  Location: [place] [facility];  Service: Cardiovascular;  Laterality: N/A;  . DIALYSIS/PERMA CATHETER REMOVAL  N/A 09/06/2018   Procedure: DIALYSIS/PERMA CATHETER REMOVAL;  Surgeon: Algernon Huxley, MD;  Location: [place];  Service: Cardiovascular;  Laterality: N/A;  . INSERTION PROSTATE RADIATION SEED    . IR PERC PLEURAL DRAIN W/INDWELL CATH W/IMG GUIDE  11/03/2019  . IRRIGATION AND DEBRIDEMENT FOOT Right 12/17/2018   Procedure: IRRIGATION AND DEBRIDEMENT FOOT;  Surgeon: Samara Deist, DPM;  Location: [facility];  Service: Podiatry;   Laterality: Right;  . LOWER EXTREMITY ANGIOGRAPHY Right 06/16/2018   Procedure: LOWER EXTREMITY ANGIOGRAPHY;  Surgeon: Algernon Huxley, MD;  Location: [place];  Service: Cardiovascular;  Laterality: Right;  . LOWER EXTREMITY ANGIOGRAPHY Left 08/30/2018   Procedure: LOWER EXTREMITY ANGIOGRAPHY;  Surgeon: Algernon Huxley, MD;  Location: [facility] [facility];  Service: Cardiovascular;  Laterality: Left;  . LOWER EXTREMITY ANGIOGRAPHY Right 09/30/2018   Procedure: LOWER EXTREMITY ANGIOGRAPHY;  Surgeon: Algernon Huxley, MD;  Location: [place];  Service: Cardiovascular;  Laterality: Right;  . LOWER EXTREMITY ANGIOGRAPHY Left 10/07/2018   Procedure: LOWER EXTREMITY ANGIOGRAPHY;  Surgeon: Algernon Huxley, MD;  Location: [place] [facility];  Service: Cardiovascular;  Laterality: Left;  . LOWER EXTREMITY ANGIOGRAPHY Right 12/20/2018   Procedure: Lower Extremity Angiography;  Surgeon: Algernon Huxley, MD;  Location: [place] [facility];  Service: Cardiovascular;  Laterality: Right;  . TRANSMETATARSAL AMPUTATION Bilateral 10/13/2018   Procedure: 1.  Amputation right great toe MTPJ 2.  Amputation right second toe MTPJ 3.  Amputation right fourth toe MTPJ 4.  Amputation right fifth toe MTPJ 5.  Excision distal second metatarsal head right foot 6.  Excision distal third metatarsal head right foot 7.  Amputation left third toe 8.  Incision and drainage infectionleft 2nd toe amputation site. ;  Surgeon: Samara Deist, DPM;  Location    Prior to Admission medications   Medication Sig Start Date End Date Taking? Authorizing Provider  allopurinol (ZYLOPRIM) 100 MG tablet Take 1 tablet (100 mg total) by mouth daily. 10/24/19   Nicole Kindred A, DO  amLODipine (NORVASC) 5 MG tablet Take 10 mg by mouth daily.    [provider]  aspirin 81 MG EC tablet Take 1 tablet (81 mg total) by mouth daily. 10/24/19   Ezekiel Slocumb, DO  atorvastatin (LIPITOR) 10 MG tablet Take 1 tablet (10 mg total) by mouth  daily at 6 PM. 10/24/19   Nicole Kindred A, DO  calcium acetate (PHOSLO) 667 MG capsule Take 2 capsules (1,334 mg total) by mouth 3 (three) times daily with meals. Patient not taking: No sig reported 12/21/18   Fritzi Mandes, MD  clopidogrel (PLAVIX) 75 MG tablet Take 1 tablet (75 mg total) by mouth daily. 10/24/19   Ezekiel Slocumb, DO  diphenoxylate-atropine (LOMOTIL) 2.5-0.025 MG tablet Take 1 tablet by mouth every 12 (twelve) hours as needed for diarrhea or loose stools. Patient not taking: No sig reported 02/15/20   Earlie Server, MD  escitalopram (LEXAPRO) 10 MG tablet Take 1 tablet (10 mg total) by mouth daily. 10/24/19   Nicole Kindred A, DO  gabapentin (NEURONTIN) 300 MG capsule Take 300 mg by mouth 3 (three) times daily.    [provider]  glipiZIDE (GLUCOTROL) 5 MG tablet Take by mouth daily before breakfast.    [provider]  guaiFENesin (MUCINEX) 600 MG 12 hr tablet Take 1 tablet (600 mg total) by mouth 2 (two) times daily. 06/20/20   Earlie Server, MD  nicotine (NICODERM CQ - DOSED IN  MG/24 HOURS) 21 mg/24hr patch Place 1 patch (21 mg total) onto the skin daily. 11/06/19   Sheikh, Omair Latif, DO  ondansetron (ZOFRAN) 4 MG tablet Take 1 tablet (4 mg total) by mouth every 6 (six) hours as needed for nausea. 11/05/19   Raiford Noble Latif, DO  oxyCODONE (OXY IR/ROXICODONE) 5 MG immediate release tablet Take 1 tablet (5 mg total) by mouth every 6 (six) hours as needed for severe pain. Patient not taking: No sig reported 11/05/19   Raiford Noble Latif, DO  tamsulosin (FLOMAX) 0.4 MG CAPS capsule Take 1 capsule (0.4 mg total) by mouth daily. 08/01/20   Earlie Server, MD  traMADol (ULTRAM) 50 MG tablet 50 mg. Patient not taking: No sig reported 03/16/19   [provider]    Allergies Penicillin g  Family History  Problem Relation Age of Onset  . Varicose Veins Neg Hx     Social History Social History   Tobacco Use  . Smoking status: Current Some Day Smoker    Packs/day:  0.10    Types: Cigarettes  . Smokeless tobacco: Never Used  . Tobacco comment: pt currently smokes about 3 cigarettes a day   Vaping Use  . Vaping Use: Never used  Substance Use Topics  . Alcohol use: Not Currently    Alcohol/week: 0.0 standard drinks  . Drug use: Never    Review of Systems  Constitutional: No fever.  Positive for generalized weakness. Eyes: No visual changes. ENT: No sore throat. Cardiovascular: Denies chest pain. Respiratory: Positive for cough.  Denies shortness of breath. Gastrointestinal: No vomiting or diarrhea.  Genitourinary: Negative for dysuria.  Musculoskeletal: Negative for back pain. Skin: Negative for rash. Neurological: Negative for headache.   ____________________________________________   PHYSICAL EXAM:  VITAL SIGNS: ED Triage Vitals  Enc Vitals Group     BP 08/30/20 1612 (!) 142/92     Pulse Rate 08/30/20 1608 78     Resp 08/30/20 1608 17     Temp 08/30/20 1608 97.7 F (36.5 C)     Temp src --      SpO2 08/30/20 1608 98 %     Weight 08/30/20 1606 160 lb (72.6 kg)     Height 08/30/20 1606 6' (1.829 m)     Head Circumference --      Peak Flow --      Pain Score 08/30/20 1602 0     Pain Loc --      Pain Edu? --      Excl. in [place]? --     Constitutional: Alert and oriented.  Somewhat weak and frail appearing but in no acute distress. Eyes: Conjunctivae are normal.  Head: Atraumatic. Nose: No congestion/rhinnorhea. Mouth/Throat: Mucous membranes are moist.   Neck: Normal range of motion.  Cardiovascular: Normal rate, regular rhythm. Grossly normal heart sounds.  Good peripheral circulation. Respiratory: Normal respiratory effort.  No retractions. Lungs CTAB. Gastrointestinal: Soft and nontender. No distention.  Genitourinary: No flank tenderness. Musculoskeletal: 1+ bilateral lower extremity edema.  Extremities warm and well perfused.  Neurologic:  Normal speech and language. No gross focal neurologic deficits are appreciated.   Skin:  Skin is warm and dry. No rash noted. Psychiatric: Mood and affect are normal. Speech and behavior are normal.  ____________________________________________   LABS (all labs ordered are listed, but only abnormal results are displayed)  Labs Reviewed  COMPREHENSIVE METABOLIC PANEL - Abnormal; Notable for the following components:      Result Value   Chloride 94 (*)  Creatinine, Ser 5.32 (*)    Alkaline Phosphatase 36 (*)    GFR, Estimated 11 (*)    All other components within normal limits  CBC WITH DIFFERENTIAL/PLATELET - Abnormal; Notable for the following components:   RDW 15.9 (*)    Platelets 138 (*)    All other components within normal limits  BRAIN NATRIURETIC PEPTIDE - Abnormal; Notable for the following components:   B Natriuretic Peptide >4,500.0 (*)    All other components within normal limits  TROPONIN I (HIGH SENSITIVITY) - Abnormal; Notable for the following components:   Troponin I (High Sensitivity) 27 (*)    All other components within normal limits  TROPONIN I (HIGH SENSITIVITY) - Abnormal; Notable for the following components:   Troponin I (High Sensitivity) 27 (*)    All other components within normal limits   ____________________________________________  EKG  ED ECG REPORT I, Arta Silence, the attending physician, personally viewed and interpreted this ECG.  Date: 08/30/2020 EKG Time: 1849 Rate: 78 Rhythm: normal sinus rhythm QRS Axis: normal Intervals: Prolonged PR, LBBB ST/T Wave abnormalities: Nonspecific ST abnormality Narrative Interpretation: LBBB and nonspecific abnormalities with no evidence of acute ischemia  ____________________________________________  RADIOLOGY  Chest x-ray interpreted by me shows bilateral opacities, possible pneumonia versus edema CT chest: Groundglass opacity in the right upper and lower lobes, possible pneumonitis versus edema.  Small pleural effusion.  Otherwise  unchanged.  ____________________________________________   PROCEDURES  Procedure(s) performed: No  Procedures  Critical Care performed: No ____________________________________________   INITIAL IMPRESSION / ASSESSMENT AND PLAN / ED COURSE  Pertinent labs & imaging results that were available during my care of the patient were reviewed by me and considered in my medical decision making (see chart for details).  75 year old male with PMH as noted above including ESRD on dialysis and lung cancer presents with generalized weakness over approximately the last 2 weeks.  He also reports a productive cough but no active shortness of breath.  I reviewed the past medical records in [place].  The patient was last seen in the ED in January and had COVID at that time.  He was treated as an outpatient with a monoclonal antibody infusion.  He follows at the cancer center and was last seen there on 3/23.  Work-up at that time showed likely progressing malignancy and he was planned for additional chemotherapy.  TSH was checked at that time and is elevated although has not changed in the last month, and his free T4 is normal, consistent with subclinical hypothyroidism.  On exam, the patient is overall comfortable appearing.  His vital signs are normal.  The physical exam is unremarkable.  Neurologic exam is nonfocal.  Differential is broad but includes fluid overload, viral syndrome, pneumonia, other infectious etiology, or possible metabolic disturbance.  We will obtain a chest x-ray, lab work-up, and reassess.  ----------------------------------------- 8:40 PM on 08/30/2020 -----------------------------------------  Lab work-up reveals a slightly elevated troponin which is not concerning for ischemia in this ESRD patient.  The patient has no chest pain, shortness of breath, or other findings to suggest ACS.  His EKG shows an LBBB.  On close examination of the EKG, the morphology of the T waves in some  leads is slightly different than the most recent EKG from 06/05/20, however the overall conduction delay is unchanged from prior.  EKG does not meet [place] criteria.  Repeat troponin was obtained after an hour rather than 2 hours although is unchanged.  Given the lack of chest pain  or other acute symptoms, the EKG findings, and the overall clinical presentation, there is no clinical evidence for ACS or indication for further ACS work-up.  Lab work-up also reveals an elevated BNP.  The chest x-ray showed bilateral opacities which suggested a possible pneumonia versus edema.  The patient had COVID-19 in January although reports that he did not have significant respiratory symptoms at that time.  I therefore obtained a CT for further evaluation.  The CT shows interstitial opacities that could represent pneumonitis from COVID-19 versus edema.  The patient has no fever, elevated WBC count, shortness of breath, or other signs or symptoms to suggest active pneumonia or continued COVID-19.    The patient does still make some urine, so I ordered a dose of IV Lasix.  I discussed disposition with the patient, he states that he overall feels relatively well and has a strong preference to go home.  He has no shortness of breath, or increased work of breathing and his O2 saturation is normal.  Therefore, there is no indication to admit him this time.  I counseled him on the results of the work-up.  I gave him thorough return precautions and he expressed understanding.   ____________________________________________   FINAL CLINICAL IMPRESSION(S) / ED DIAGNOSES  Final diagnoses:  Weakness  Acute on chronic congestive heart failure, unspecified heart failure type ([place])      NEW MEDICATIONS STARTED DURING THIS VISIT:  New Prescriptions   No medications on file     Note:  This document was prepared using Dragon voice recognition software and may include unintentional dictation errors.    Arta Silence, MD 08/30/20 2049

## 2020-08-31 ENCOUNTER — Encounter: Payer: Self-pay | Admitting: Oncology

## 2020-08-31 ENCOUNTER — Inpatient Hospital Stay (HOSPITAL_BASED_OUTPATIENT_CLINIC_OR_DEPARTMENT_OTHER): Payer: Medicare Other | Admitting: Oncology

## 2020-08-31 ENCOUNTER — Inpatient Hospital Stay: Payer: Medicare Other | Attending: Oncology

## 2020-08-31 VITALS — BP 124/79 | HR 83 | Temp 97.8°F | Wt 159.8 lb

## 2020-08-31 DIAGNOSIS — R5383 Other fatigue: Secondary | ICD-10-CM

## 2020-08-31 DIAGNOSIS — E038 Other specified hypothyroidism: Secondary | ICD-10-CM

## 2020-08-31 DIAGNOSIS — N186 End stage renal disease: Secondary | ICD-10-CM

## 2020-08-31 DIAGNOSIS — C3492 Malignant neoplasm of unspecified part of left bronchus or lung: Secondary | ICD-10-CM | POA: Diagnosis not present

## 2020-08-31 DIAGNOSIS — J910 Malignant pleural effusion: Secondary | ICD-10-CM

## 2020-08-31 DIAGNOSIS — J40 Bronchitis, not specified as acute or chronic: Secondary | ICD-10-CM

## 2020-08-31 MED ORDER — LEVOTHYROXINE SODIUM 50 MCG PO TABS: 50.0000 ug | ORAL_TABLET | Freq: Every day | ORAL | 1 refills | Status: DC

## 2020-08-31 MED ORDER — AZITHROMYCIN 250 MG PO TABS: ORAL_TABLET | ORAL | 0 refills | Status: DC

## 2020-08-31 MED ORDER — GUAIFENESIN ER 600 MG PO TB12: 600.0000 mg | ORAL_TABLET | Freq: Two times a day (BID) | ORAL | 1 refills | Status: DC

## 2020-08-31 NOTE — Progress Notes (Signed)
Pt here for follow up.

## 2020-09-01 ENCOUNTER — Inpatient Hospital Stay
Admission: EM | Admit: 2020-09-01 | Discharge: 2020-09-04 | DRG: 640 | Disposition: A | Discharge status: Skilled Nursing Facility | Payer: Medicare Other | Attending: Internal Medicine | Admitting: Internal Medicine

## 2020-09-01 ENCOUNTER — Encounter: Payer: Self-pay | Admitting: Intensive Care

## 2020-09-01 ENCOUNTER — Other Ambulatory Visit: Payer: Self-pay

## 2020-09-01 ENCOUNTER — Emergency Department: Payer: Medicare Other

## 2020-09-01 DIAGNOSIS — R338 Other retention of urine: Secondary | ICD-10-CM | POA: Diagnosis present

## 2020-09-01 DIAGNOSIS — F17210 Nicotine dependence, cigarettes, uncomplicated: Secondary | ICD-10-CM | POA: Diagnosis present

## 2020-09-01 DIAGNOSIS — R531 Weakness: Secondary | ICD-10-CM

## 2020-09-01 DIAGNOSIS — I2510 Atherosclerotic heart disease of native coronary artery without angina pectoris: Secondary | ICD-10-CM | POA: Diagnosis present

## 2020-09-01 DIAGNOSIS — I447 Left bundle-branch block, unspecified: Secondary | ICD-10-CM | POA: Diagnosis present

## 2020-09-01 DIAGNOSIS — I70269 Atherosclerosis of native arteries of extremities with gangrene, unspecified extremity: Secondary | ICD-10-CM | POA: Diagnosis present

## 2020-09-01 DIAGNOSIS — E1169 Type 2 diabetes mellitus with other specified complication: Secondary | ICD-10-CM | POA: Diagnosis not present

## 2020-09-01 DIAGNOSIS — I132 Hypertensive heart and chronic kidney disease with heart failure and with stage 5 chronic kidney disease, or end stage renal disease: Secondary | ICD-10-CM | POA: Diagnosis present

## 2020-09-01 DIAGNOSIS — N401 Enlarged prostate with lower urinary tract symptoms: Secondary | ICD-10-CM | POA: Diagnosis present

## 2020-09-01 DIAGNOSIS — Z20822 Contact with and (suspected) exposure to covid-19: Secondary | ICD-10-CM | POA: Diagnosis present

## 2020-09-01 DIAGNOSIS — N2581 Secondary hyperparathyroidism of renal origin: Secondary | ICD-10-CM | POA: Diagnosis present

## 2020-09-01 DIAGNOSIS — H409 Unspecified glaucoma: Secondary | ICD-10-CM | POA: Diagnosis present

## 2020-09-01 DIAGNOSIS — Z720 Tobacco use: Secondary | ICD-10-CM | POA: Diagnosis present

## 2020-09-01 DIAGNOSIS — I70263 Atherosclerosis of native arteries of extremities with gangrene, bilateral legs: Secondary | ICD-10-CM | POA: Diagnosis not present

## 2020-09-01 DIAGNOSIS — E8770 Fluid overload, unspecified: Secondary | ICD-10-CM | POA: Diagnosis not present

## 2020-09-01 DIAGNOSIS — Z681 Body mass index (BMI) 19 or less, adult: Secondary | ICD-10-CM

## 2020-09-01 DIAGNOSIS — E785 Hyperlipidemia, unspecified: Secondary | ICD-10-CM | POA: Diagnosis present

## 2020-09-01 DIAGNOSIS — C3412 Malignant neoplasm of upper lobe, left bronchus or lung: Secondary | ICD-10-CM | POA: Diagnosis present

## 2020-09-01 DIAGNOSIS — I440 Atrioventricular block, first degree: Secondary | ICD-10-CM | POA: Diagnosis present

## 2020-09-01 DIAGNOSIS — J910 Malignant pleural effusion: Secondary | ICD-10-CM | POA: Diagnosis not present

## 2020-09-01 DIAGNOSIS — R5381 Other malaise: Secondary | ICD-10-CM | POA: Diagnosis not present

## 2020-09-01 DIAGNOSIS — E43 Unspecified severe protein-calorie malnutrition: Secondary | ICD-10-CM | POA: Diagnosis present

## 2020-09-01 DIAGNOSIS — F32A Depression, unspecified: Secondary | ICD-10-CM | POA: Diagnosis not present

## 2020-09-01 DIAGNOSIS — N186 End stage renal disease: Secondary | ICD-10-CM

## 2020-09-01 DIAGNOSIS — M109 Gout, unspecified: Secondary | ICD-10-CM | POA: Diagnosis present

## 2020-09-01 DIAGNOSIS — C3492 Malignant neoplasm of unspecified part of left bronchus or lung: Secondary | ICD-10-CM | POA: Diagnosis not present

## 2020-09-01 DIAGNOSIS — E1129 Type 2 diabetes mellitus with other diabetic kidney complication: Secondary | ICD-10-CM | POA: Diagnosis present

## 2020-09-01 DIAGNOSIS — F418 Other specified anxiety disorders: Secondary | ICD-10-CM | POA: Diagnosis present

## 2020-09-01 DIAGNOSIS — Z880 Allergy status to penicillin: Secondary | ICD-10-CM

## 2020-09-01 DIAGNOSIS — E1122 Type 2 diabetes mellitus with diabetic chronic kidney disease: Secondary | ICD-10-CM | POA: Diagnosis present

## 2020-09-01 DIAGNOSIS — F419 Anxiety disorder, unspecified: Secondary | ICD-10-CM

## 2020-09-01 DIAGNOSIS — Z7984 Long term (current) use of oral hypoglycemic drugs: Secondary | ICD-10-CM

## 2020-09-01 DIAGNOSIS — Z992 Dependence on renal dialysis: Secondary | ICD-10-CM | POA: Diagnosis not present

## 2020-09-01 DIAGNOSIS — Z79890 Hormone replacement therapy (postmenopausal): Secondary | ICD-10-CM

## 2020-09-01 DIAGNOSIS — E039 Hypothyroidism, unspecified: Secondary | ICD-10-CM | POA: Diagnosis present

## 2020-09-01 DIAGNOSIS — D631 Anemia in chronic kidney disease: Secondary | ICD-10-CM | POA: Diagnosis present

## 2020-09-01 DIAGNOSIS — I96 Gangrene, not elsewhere classified: Secondary | ICD-10-CM | POA: Diagnosis present

## 2020-09-01 DIAGNOSIS — Z8546 Personal history of malignant neoplasm of prostate: Secondary | ICD-10-CM

## 2020-09-01 DIAGNOSIS — E1142 Type 2 diabetes mellitus with diabetic polyneuropathy: Secondary | ICD-10-CM | POA: Diagnosis present

## 2020-09-01 DIAGNOSIS — Z79899 Other long term (current) drug therapy: Secondary | ICD-10-CM

## 2020-09-01 DIAGNOSIS — I5032 Chronic diastolic (congestive) heart failure: Secondary | ICD-10-CM | POA: Diagnosis not present

## 2020-09-01 DIAGNOSIS — I639 Cerebral infarction, unspecified: Secondary | ICD-10-CM | POA: Diagnosis present

## 2020-09-01 DIAGNOSIS — Z7902 Long term (current) use of antithrombotics/antiplatelets: Secondary | ICD-10-CM

## 2020-09-01 DIAGNOSIS — Z8673 Personal history of transient ischemic attack (TIA), and cerebral infarction without residual deficits: Secondary | ICD-10-CM

## 2020-09-01 DIAGNOSIS — C3490 Malignant neoplasm of unspecified part of unspecified bronchus or lung: Secondary | ICD-10-CM | POA: Diagnosis present

## 2020-09-01 DIAGNOSIS — E1151 Type 2 diabetes mellitus with diabetic peripheral angiopathy without gangrene: Secondary | ICD-10-CM | POA: Diagnosis present

## 2020-09-01 DIAGNOSIS — Z7982 Long term (current) use of aspirin: Secondary | ICD-10-CM

## 2020-09-01 LAB — BASIC METABOLIC PANEL
Anion gap: 14 (ref 5–15)
BUN: 36 mg/dL — ABNORMAL HIGH (ref 8–23)
CO2: 27 mmol/L (ref 22–32)
Calcium: 9.6 mg/dL (ref 8.9–10.3)
Chloride: 93 mmol/L — ABNORMAL LOW (ref 98–111)
Creatinine, Ser: 7.73 mg/dL — ABNORMAL HIGH (ref 0.61–1.24)
GFR, Estimated: 7 mL/min — ABNORMAL LOW (ref >60)
Glucose, Bld: 123 mg/dL — ABNORMAL HIGH (ref 70–99)
Potassium: 3.6 mmol/L (ref 3.5–5.1)
Sodium: 134 mmol/L — ABNORMAL LOW (ref 135–145)

## 2020-09-01 LAB — PROCALCITONIN: Procalcitonin: 0.41 ng/mL

## 2020-09-01 LAB — CBC
HCT: 43 % (ref 39.0–52.0)
Hemoglobin: 14.3 g/dL (ref 13.0–17.0)
MCH: 32.6 pg (ref 26.0–34.0)
MCHC: 33.3 g/dL (ref 30.0–36.0)
MCV: 98.2 fL (ref 80.0–100.0)
Platelets: 133 10*3/uL — ABNORMAL LOW (ref 150–400)
RBC: 4.38 MIL/uL (ref 4.22–5.81)
RDW: 16.3 % — ABNORMAL HIGH (ref 11.5–15.5)
WBC: 8.1 10*3/uL (ref 4.0–10.5)
nRBC: 0 % (ref 0.0–0.2)

## 2020-09-01 LAB — RESP PANEL BY RT-PCR (FLU A&B, COVID) ARPGX2
Influenza A by PCR: NEGATIVE
Influenza B by PCR: NEGATIVE
SARS Coronavirus 2 by RT PCR: NEGATIVE

## 2020-09-01 LAB — GLUCOSE, CAPILLARY: Glucose-Capillary: 131 mg/dL — ABNORMAL HIGH (ref 70–99)

## 2020-09-01 LAB — PHOSPHORUS: Phosphorus: 3.3 mg/dL (ref 2.5–4.6)

## 2020-09-01 LAB — BRAIN NATRIURETIC PEPTIDE: B Natriuretic Peptide: >4500 pg/mL — ABNORMAL HIGH (ref 0.0–100.0)

## 2020-09-01 MED ORDER — ATORVASTATIN CALCIUM 20 MG PO TABS
10.0000 mg | ORAL_TABLET | Freq: Every day | ORAL | Status: DC
  Administered 2020-09-01 – 2020-09-02 (×2): 10 mg via ORAL
  Filled 2020-09-01 (×2): qty 1

## 2020-09-01 MED ORDER — CHLORHEXIDINE GLUCONATE CLOTH 2 % EX PADS
6.0000 | MEDICATED_PAD | Freq: Every day | CUTANEOUS | Status: DC
  Administered 2020-09-02 – 2020-09-04 (×3): 6 via TOPICAL
  Filled 2020-09-01: qty 6

## 2020-09-01 MED ORDER — ASPIRIN EC 81 MG PO TBEC
81.0000 mg | DELAYED_RELEASE_TABLET | Freq: Every day | ORAL | Status: DC
  Administered 2020-09-02 – 2020-09-04 (×3): 81 mg via ORAL
  Filled 2020-09-01 (×3): qty 1

## 2020-09-01 MED ORDER — INSULIN ASPART 100 UNIT/ML ~~LOC~~ SOLN
0.0000 [IU] | Freq: Three times a day (TID) | SUBCUTANEOUS | Status: DC
  Administered 2020-09-02 – 2020-09-03 (×2): 2 [IU] via SUBCUTANEOUS
  Filled 2020-09-01 (×2): qty 1

## 2020-09-01 MED ORDER — LEVOTHYROXINE SODIUM 50 MCG PO TABS
50.0000 ug | ORAL_TABLET | Freq: Every day | ORAL | Status: DC
  Administered 2020-09-02 – 2020-09-04 (×3): 50 ug via ORAL
  Filled 2020-09-01 (×3): qty 1

## 2020-09-01 MED ORDER — TORSEMIDE 20 MG PO TABS
40.0000 mg | ORAL_TABLET | Freq: Every day | ORAL | Status: DC
  Administered 2020-09-01 – 2020-09-03 (×3): 40 mg via ORAL
  Filled 2020-09-01 (×3): qty 2

## 2020-09-01 MED ORDER — ACETAMINOPHEN 325 MG PO TABS: 650.0000 mg | ORAL_TABLET | Freq: Four times a day (QID) | ORAL | Status: DC | PRN

## 2020-09-01 MED ORDER — TRAZODONE HCL 50 MG PO TABS: 25.0000 mg | ORAL_TABLET | Freq: Every evening | ORAL | Status: DC | PRN

## 2020-09-01 MED ORDER — OXYCODONE HCL 5 MG PO TABS: 5.0000 mg | ORAL_TABLET | ORAL | Status: DC | PRN

## 2020-09-01 MED ORDER — POLYETHYLENE GLYCOL 3350 17 G PO PACK: 17.0000 g | PACK | Freq: Every day | ORAL | Status: DC | PRN

## 2020-09-01 MED ORDER — CALCIUM ACETATE (PHOS BINDER) 667 MG PO CAPS
1334.0000 mg | ORAL_CAPSULE | Freq: Three times a day (TID) | ORAL | Status: DC
  Administered 2020-09-02 – 2020-09-04 (×7): 1334 mg via ORAL
  Filled 2020-09-01 (×9): qty 2

## 2020-09-01 MED ORDER — TAMSULOSIN HCL 0.4 MG PO CAPS
0.4000 mg | ORAL_CAPSULE | Freq: Every day | ORAL | Status: DC
  Administered 2020-09-02 – 2020-09-04 (×3): 0.4 mg via ORAL
  Filled 2020-09-01 (×3): qty 1

## 2020-09-01 MED ORDER — GABAPENTIN 100 MG PO CAPS
100.0000 mg | ORAL_CAPSULE | Freq: Three times a day (TID) | ORAL | Status: DC
  Administered 2020-09-01 – 2020-09-04 (×9): 100 mg via ORAL
  Filled 2020-09-01 (×9): qty 1

## 2020-09-01 MED ORDER — CLOPIDOGREL BISULFATE 75 MG PO TABS
75.0000 mg | ORAL_TABLET | Freq: Every day | ORAL | Status: DC
  Administered 2020-09-02 – 2020-09-04 (×3): 75 mg via ORAL
  Filled 2020-09-01 (×3): qty 1

## 2020-09-01 MED ORDER — ONDANSETRON HCL 4 MG/2ML IJ SOLN: 4.0000 mg | Freq: Four times a day (QID) | INTRAMUSCULAR | Status: DC | PRN

## 2020-09-01 MED ORDER — ENOXAPARIN SODIUM 30 MG/0.3ML ~~LOC~~ SOLN
30.0000 mg | SUBCUTANEOUS | Status: DC
  Administered 2020-09-01: 22:00:00 30 mg via SUBCUTANEOUS
  Filled 2020-09-01: qty 0.3

## 2020-09-01 MED ORDER — ONDANSETRON HCL 4 MG PO TABS: 4.0000 mg | ORAL_TABLET | Freq: Four times a day (QID) | ORAL | Status: DC | PRN

## 2020-09-01 MED ORDER — NEPRO/CARBSTEADY PO LIQD
237.0000 mL | Freq: Two times a day (BID) | ORAL | Status: DC
  Administered 2020-09-02 – 2020-09-04 (×5): 237 mL via ORAL

## 2020-09-01 MED ORDER — ESCITALOPRAM OXALATE 10 MG PO TABS
10.0000 mg | ORAL_TABLET | Freq: Every day | ORAL | Status: DC
  Administered 2020-09-02 – 2020-09-04 (×3): 10 mg via ORAL
  Filled 2020-09-01 (×3): qty 1

## 2020-09-01 MED ORDER — AMLODIPINE BESYLATE 10 MG PO TABS
10.0000 mg | ORAL_TABLET | Freq: Every day | ORAL | Status: DC
  Administered 2020-09-02 – 2020-09-03 (×2): 10 mg via ORAL
  Filled 2020-09-01 (×2): qty 1

## 2020-09-01 MED ORDER — ADULT MULTIVITAMIN W/MINERALS CH
1.0000 | ORAL_TABLET | Freq: Every day | ORAL | Status: DC
  Administered 2020-09-02 – 2020-09-03 (×2): 1 via ORAL
  Filled 2020-09-01 (×2): qty 1

## 2020-09-01 MED ORDER — METHOCARBAMOL 1000 MG/10ML IJ SOLN
500.0000 mg | Freq: Four times a day (QID) | INTRAMUSCULAR | Status: DC | PRN
  Filled 2020-09-01: qty 5

## 2020-09-01 NOTE — Progress Notes (Signed)
Hematology/Oncology Follow Up Note [facility]  [phone] [phone]  Patient Care Team: Jose Pink, MD as PCP - General (Family Medicine) Jose Nab, RN as Oncology Nurse Navigator Jose Server, MD as Consulting Physician (Hematology and Oncology)   Name of the patient: Jose [name]  [id number]  02-12-1946   REASON FOR VISIT  follow-up for lung cancer   PERTINENT ONCOLOGY HISTORY 75 y.o. with past medical history hemodialysis ESRD, history of prostate cancer, stage IV lung cancer presents for follow-up # Patient has left pleural catheter placed, per patient he has home care nurse checking in and draining the pleural catheter.  He denies any shortness of breath at resting or with exertion. He lives at home with sister.  His nephew sometimes helps him with transportation.  Identified biggest barrier for him for follow-up visits/treatment visits is transportation.  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 Chronic findings include remote infarcts, moderate cerebral atrophy, chronic ischemic changes, Nonocclusive left V4 segment thrombus with slow flow. Moderate narrowing of the bilateral supraclinoid ICAs  He did not come to his appointment on 05/28/2020. 06/05/2020  nasal congestion, chills, fever.  COVID19 positive.  He got monoclonal antibody treatment.  01/04/2020 - 08/22/2020 Keytruda   INTERVAL HISTORY Jose [name] is a 75 y.o. male who has above history reviewed by me today presents for follow up visit for stage IV lung cancer treatment plan discussion, evaluation of productive cough and weakness. Problems and complaints are listed below Patient reports productive cough with thick whitish mucus.  No fever or chills.  No shortness of breath. He also feels quite fatigued.   Review of Systems   Constitutional: Positive for fatigue. Negative for appetite change, chills, fever and unexpected weight change.  HENT:   Negative for hearing loss and voice change.   Eyes: Negative for eye problems and icterus.  Respiratory: Positive for cough. Negative for chest tightness and shortness of breath.   Cardiovascular: Negative for chest pain and leg swelling.  Gastrointestinal: Negative for abdominal distention, abdominal pain and diarrhea.  Endocrine: Negative for hot flashes.  Genitourinary: Negative for dysuria and frequency.   Musculoskeletal: Negative for arthralgias.  Skin: Negative for itching and rash.  Neurological: Negative for light-headedness and numbness.  Hematological: Negative for adenopathy. Does not bruise/bleed easily.  Psychiatric/Behavioral: Negative for confusion.       Allergies  Allergen Reactions  . Penicillin G Other (See Comments)    Other reaction(s): Unknown DID THE REACTION INVOLVE: Swelling of the face/tongue/throat, SOB, or low BP? Unknown Sudden or severe rash/hives, skin peeling, or the inside of the mouth or nose? Unknown Did it require medical treatment? Unknown When did it last happen?unknown If all above answers are "NO", may proceed with cephalosporin use.      Past Medical History:  Diagnosis Date  . Chronic kidney disease   . Depression   . Diabetes mellitus without complication ([place])   . Glaucoma   . Glaucoma   . Gout   . Heart murmur   . HLD (hyperlipidemia)   . Hypertension   . Primary adenocarcinoma of left lung ([place])   . Prostate cancer ([place])   . Renal failure    dialysis t/t/s  . Renal insufficiency   . Stroke ([place])    tia x 2     Past Surgical History:  Procedure  Laterality Date  . AMPUTATION TOE Bilateral 07/23/2018   Procedure: TOE MPJ RIGHT 3RD AND LEFT 2ND;  Surgeon: Samara Deist, DPM;  Location: [facility];  Service: Podiatry;  Laterality: Bilateral;  . APPENDECTOMY    . AV FISTULA PLACEMENT Left 06/09/2018    Procedure: ARTERIOVENOUS (AV) FISTULA CREATION ( BRACHIAL CEPHALIC);  Surgeon: Algernon Huxley, MD;  Location: [facility];  Service: Vascular;  Laterality: Left;  . CHOLECYSTECTOMY    . DIALYSIS/PERMA CATHETER INSERTION N/A 03/24/2018   Procedure: DIALYSIS/PERMA CATHETER INSERTION;  Surgeon: Katha Cabal, MD;  Location: [place] [facility];  Service: Cardiovascular;  Laterality: N/A;  . DIALYSIS/PERMA CATHETER REMOVAL N/A 09/06/2018   Procedure: DIALYSIS/PERMA CATHETER REMOVAL;  Surgeon: Algernon Huxley, MD;  Location: [place] [facility];  Service: Cardiovascular;  Laterality: N/A;  . INSERTION PROSTATE RADIATION SEED    . IR PERC PLEURAL DRAIN W/INDWELL CATH W/IMG GUIDE  11/03/2019  . IRRIGATION AND DEBRIDEMENT FOOT Right 12/17/2018   Procedure: IRRIGATION AND DEBRIDEMENT FOOT;  Surgeon: Samara Deist, DPM;  Location: [facility];  Service: Podiatry;  Laterality: Right;  . LOWER EXTREMITY ANGIOGRAPHY Right 06/16/2018   Procedure: LOWER EXTREMITY ANGIOGRAPHY;  Surgeon: Algernon Huxley, MD;  Location: [place] [facility];  Service: Cardiovascular;  Laterality: Right;  . LOWER EXTREMITY ANGIOGRAPHY Left 08/30/2018   Procedure: LOWER EXTREMITY ANGIOGRAPHY;  Surgeon: Algernon Huxley, MD;  Location: [place] [facility];  Service: Cardiovascular;  Laterality: Left;  . LOWER EXTREMITY ANGIOGRAPHY Right 09/30/2018   Procedure: LOWER EXTREMITY ANGIOGRAPHY;  Surgeon: Algernon Huxley, MD;  Location: [place] [facility];  Service: Cardiovascular;  Laterality: Right;  . LOWER EXTREMITY ANGIOGRAPHY Left 10/07/2018   Procedure: LOWER EXTREMITY ANGIOGRAPHY;  Surgeon: Algernon Huxley, MD;  Location: [place] [facility];  Service: Cardiovascular;  Laterality: Left;  . LOWER EXTREMITY ANGIOGRAPHY Right 12/20/2018   Procedure: Lower Extremity Angiography;  Surgeon: Algernon Huxley, MD;  Location: [place] [facility];  Service: Cardiovascular;  Laterality: Right;  . TRANSMETATARSAL AMPUTATION Bilateral 10/13/2018   Procedure: 1.   Amputation right great toe MTPJ 2.  Amputation right second toe MTPJ 3.  Amputation right fourth toe MTPJ 4.  Amputation right fifth toe MTPJ 5.  Excision distal second metatarsal head right foot 6.  Excision distal third metatarsal head right foot 7.  Amputation left third toe 8.  Incision and drainage infectionleft 2nd toe amputation site. ;  Surgeon: Samara Deist, DPM;  Location    Social History   Socioeconomic History  . Marital status: Widowed    Spouse name: Not on file  . Number of children: Not on file  . Years of education: Not on file  . Highest education level: Not on file  Occupational History    Employer: RETIRED  Tobacco Use  . Smoking status: Current Some Day Smoker    Packs/day: 0.10    Types: Cigarettes  . Smokeless tobacco: Never Used  . Tobacco comment: pt currently smokes about 3 cigarettes a day   Vaping Use  . Vaping Use: Never used  Substance and Sexual Activity  . Alcohol use: Not Currently    Alcohol/week: 0.0 standard drinks  . Drug use: Never  . Sexual activity: Not Currently  Other Topics Concern  . Not on file  Social History Narrative  . Not on file   Social Determinants of Health   Financial Resource Strain: Not on file  Food Insecurity: Not on file  Transportation Needs: Not on file  Physical Activity: Not on file  Stress: Not on file  Social Connections: Not on file  Intimate Partner Violence: Not on file    Family History  Problem Relation Age of Onset  . Varicose Veins Neg Hx      Current Outpatient Medications:  .  allopurinol (ZYLOPRIM) 100 MG tablet, Take 1 tablet (100 mg total) by mouth daily., Disp: 30 tablet, Rfl: 2 .  amLODipine (NORVASC) 5 MG tablet, Take 10 mg by mouth daily., Disp: , Rfl:  .  aspirin 81 MG EC tablet, Take 1 tablet (81 mg total) by mouth daily., Disp: 30 tablet, Rfl: 2 .  atorvastatin (LIPITOR) 10 MG tablet, Take 1 tablet (10 mg total) by mouth daily at 6 PM., Disp: 30 tablet, Rfl: 2 .  azithromycin  (ZITHROMAX) 250 MG tablet, 2 tabs (562m) once, then 1 tab (250 mg) daily x4 days, Disp: 6 each, Rfl: 0 .  clopidogrel (PLAVIX) 75 MG tablet, Take 1 tablet (75 mg total) by mouth daily., Disp: 30 tablet, Rfl: 2 .  escitalopram (LEXAPRO) 10 MG tablet, Take 1 tablet (10 mg total) by mouth daily., Disp: 30 tablet, Rfl: 2 .  gabapentin (NEURONTIN) 300 MG capsule, Take 300 mg by mouth 3 (three) times daily., Disp: , Rfl:  .  glipiZIDE (GLUCOTROL) 5 MG tablet, Take by mouth daily before breakfast., Disp: , Rfl:  .  guaiFENesin (MUCINEX) 600 MG 12 hr tablet, Take 1 tablet (600 mg total) by mouth 2 (two) times daily., Disp: 60 tablet, Rfl: 0 .  guaiFENesin (MUCINEX) 600 MG 12 hr tablet, Take 1 tablet (600 mg total) by mouth 2 (two) times daily., Disp: 60 tablet, Rfl: 1 .  levothyroxine (SYNTHROID) 50 MCG tablet, Take 1 tablet (50 mcg total) by mouth daily before breakfast., Disp: 30 tablet, Rfl: 1 .  nicotine (NICODERM CQ - DOSED IN MG/24 HOURS) 21 mg/24hr patch, Place 1 patch (21 mg total) onto the skin daily., Disp: 28 patch, Rfl: 0 .  ondansetron (ZOFRAN) 4 MG tablet, Take 1 tablet (4 mg total) by mouth every 6 (six) hours as needed for nausea., Disp: 20 tablet, Rfl: 0 .  tamsulosin (FLOMAX) 0.4 MG CAPS capsule, Take 1 capsule (0.4 mg total) by mouth daily., Disp: 30 capsule, Rfl: 0 .  calcium acetate (PHOSLO) 667 MG capsule, Take 2 capsules (1,334 mg total) by mouth 3 (three) times daily with meals. (Patient not taking: No sig reported), Disp: 90 capsule, Rfl: 0 .  diphenoxylate-atropine (LOMOTIL) 2.5-0.025 MG tablet, Take 1 tablet by mouth every 12 (twelve) hours as needed for diarrhea or loose stools. (Patient not taking: No sig reported), Disp: 14 tablet, Rfl: 0 .  oxyCODONE (OXY IR/ROXICODONE) 5 MG immediate release tablet, Take 1 tablet (5 mg total) by mouth every 6 (six) hours as needed for severe pain. (Patient not taking: No sig reported), Disp: 10 tablet, Rfl: 0 .  traMADol (ULTRAM) 50 MG tablet,  50 mg. (Patient not taking: No sig reported), Disp: , Rfl:   Physical exam:  Vitals:   08/31/20 1333  BP: 124/79  Pulse: 83  Temp: 97.8 F (36.6 C)  TempSrc: Tympanic  SpO2: 100%  Weight: 159 lb 12.8 oz (72.5 kg)   Physical Exam Constitutional:      General: He is not in acute distress.    Appearance: He is not diaphoretic.     Comments: Patient sits in the wheelchair  HENT:     Head: Normocephalic and atraumatic.     Nose: Nose  normal.     Mouth/Throat:     Pharynx: No oropharyngeal exudate.  Eyes:     General: No scleral icterus.    Pupils: Pupils are equal, round, and reactive to light.  Cardiovascular:     Rate and Rhythm: Normal rate and regular rhythm.     Heart sounds: No murmur heard.   Pulmonary:     Effort: Pulmonary effort is normal. No respiratory distress.     Breath sounds: No rales.     Comments: Severely decreased breath sound bilaterally.  Left worse than right Chest:     Chest wall: No tenderness.  Abdominal:     General: There is no distension.     Palpations: Abdomen is soft.     Tenderness: There is no abdominal tenderness.  Musculoskeletal:        General: Normal range of motion.     Cervical back: Normal range of motion and neck supple.  Skin:    General: Skin is warm and dry.     Findings: No erythema.  Neurological:     Mental Status: He is alert and oriented to person, place, and time.     Cranial Nerves: No cranial nerve deficit.     Motor: No abnormal muscle tone.     Coordination: Coordination normal.  Psychiatric:        Mood and Affect: Affect normal.       CMP Latest Ref Rng & Units 08/30/2020  Glucose 70 - 99 mg/dL 89  BUN 8 - 23 mg/dL 20  Creatinine 0.61 - 1.24 mg/dL 5.32(H)  Sodium 135 - 145 mmol/L 136  Potassium 3.5 - 5.1 mmol/L 3.6  Chloride 98 - 111 mmol/L 94(L)  CO2 22 - 32 mmol/L 29  Calcium 8.9 - 10.3 mg/dL 9.0  Total Protein 6.5 - 8.1 g/dL 6.9  Total Bilirubin 0.3 - 1.2 mg/dL 1.2  Alkaline Phos 38 - 126 U/L  36(L)  AST 15 - 41 U/L 19  ALT 0 - 44 U/L 13   CBC Latest Ref Rng & Units 08/30/2020  WBC 4.0 - 10.5 K/uL 8.9  Hemoglobin 13.0 - 17.0 g/dL 13.8  Hematocrit 39.0 - 52.0 % 41.3  Platelets 150 - 400 K/uL 138(L)    RADIOGRAPHIC STUDIES: I have personally reviewed the radiological images as listed and agreed with the findings in the report. DG Chest 2 View  Result Date: 08/30/2020 CLINICAL DATA:  Cough and weakness EXAM: CHEST - 2 VIEW COMPARISON:  June 05, 2020 FINDINGS: There is airspace opacity in portions of each mid lung and left lower lobe with left pleural effusion. There is cardiomegaly with pulmonary vascularity within normal limits. No adenopathy. No bone lesions. IMPRESSION: Airspace opacity consistent with pneumonia in the left lower lung region as well as in each mid lung region. Check of COVID-19 status may be prudent in this circumstance. Left pleural effusion noted.  Stable cardiomegaly. Electronically Signed   By: Lowella Grip III M.D.   On: 08/30/2020 18:02   CT Chest Wo Contrast  Result Date: 08/30/2020 CLINICAL DATA:  Pneumonia, effusion or abscess suspected, xray done Generalized weakness for 2 weeks. Radiologic records demonstrates history of lung cancer. EXAM: CT CHEST WITHOUT CONTRAST TECHNIQUE: Multidetector CT imaging of the chest was performed following the standard protocol without IV contrast. COMPARISON:  Radiograph earlier today. Staging chest CT 8 days ago 08/21/2020 FINDINGS: Cardiovascular: Aortic atherosclerosis and tortuosity. Focal outpouching of the left lateral wall of thoracic aorta on prior exam is grossly  stable, but not well assessed on the current exam in the absence of IV contrast. No periaortic stranding. Multi chamber cardiomegaly. Coronary artery calcifications. Small pericardial effusion is similar Mediastinum/Nodes: Patient had recent staging CT with IV contrast the. Right paratracheal lymph node measuring right lower paratracheal node measuring  2.3 cm, series 2, image 50, unchanged allowing for differences in technique and caliper placement. Subcarinal node measures 2.1 cm, series 2, image 71, previously 1.9 cm. Additional smaller lymph nodes are unchanged from prior exam. Hilar adenopathy is not well assessed on this noncontrast exam. Left epicardial node measures 1.2 cm, series 2, image 139, previously 1.1 cm. No obvious no adenopathy in the interim. Decompressed esophagus. Lungs/Pleura: Partially loculated left pleural effusion at the lung base this is not significantly changed in size. There is left lower lobe bronchial thickening. No definite acute airspace disease. There is mild scarring abutting the inter lobar fissure on the left which appears similar to prior exam. Spiculated left upper lobe pulmonary nodule measures 2.5 x 2 cm, series 3, image 27, unchanged. Spiculations extend to the pleural surface anterior laterally. Background emphysema. Patchy ground-glass opacity in the perifissural right upper lobe, series 3, image 66, with adjacent fissural thickening, progressed/new. Additional a ground-glass opacity in the perihilar right lower lobe. Slight basilar septal thickening. There is a trace right pleural effusion and seen on prior exam. Upper Abdomen: Bilateral renal parenchymal atrophy. Diverticulosis of the included colon. No acute upper abdominal findings. Musculoskeletal: There are no acute or suspicious osseous abnormalities. IMPRESSION: 1. Patchy ground-glass opacity in the perifissural right upper and to a lesser extent right lower lobe. Differential considerations include pneumonitis including COVID-19 versus pulmonary edema. Small right pleural effusion is new from CT 1 week ago. 2. Unchanged partially loculated left pleural effusion at the lung base. 3. Unchanged spiculated left upper lobe pulmonary nodule. Unchanged mediastinal adenopathy. Patient recently underwent staging CT. 4. Cardiomegaly with coronary artery calcifications.  Small pericardial effusion is unchanged. Aortic Atherosclerosis (ICD10-I70.0) and Emphysema (ICD10-J43.9). Electronically Signed   By: Keith Rake M.D.   On: 08/30/2020 20:01   CT CHEST ABDOMEN PELVIS W CONTRAST  Result Date: 08/22/2020 CLINICAL DATA:  Restaging of left lung adenocarcinoma with malignant pleural effusion. Immunotherapy treatment with Keytruda. EXAM: CT CHEST, ABDOMEN, AND PELVIS WITH CONTRAST TECHNIQUE: Multidetector CT imaging of the chest, abdomen and pelvis was performed following the standard protocol during bolus administration of intravenous contrast. CONTRAST:  168m OMNIPAQUE IOHEXOL 300 MG/ML  SOLN COMPARISON:  Multiple exams, including 04/30/2020 FINDINGS: CT CHEST FINDINGS Cardiovascular: Coronary, aortic arch, and branch vessel atherosclerotic vascular disease. Mild cardiomegaly. Small pericardial effusion. 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 Focal outpouching of the left lateral wall of the thoracic aorta on image 26 series 2 suggesting a chronic small saccular aneurysm. Chronic web like filling defect / irregularity in the left lower lobe pulmonary artery on image 86 series 5, no change from prior, likely residuum from remote pulmonary embolus. Mediastinum/Nodes: Right paratracheal lymph node 2.4 cm in short axis on image 21 series 2, formerly 1.5 cm. Right paratracheal node 0.8 cm in short axis on image 11 series 2, previously 0.4 cm. Subcarinal node 1.9 cm in short axis on image 30 series 2, formerly 1.3 cm. Left infrahilar node 1.2 cm in short axis on image 29 series 2, formerly 0.7 cm. Left anterior pericardial lymph node 1.1 cm in short axis on image 54 series 2, previously  1.5 cm. Lungs/Pleura: Small left pleural effusion with suspected associated mild enhancement. Spiculated left upper lobe pulmonary nodule 2.5 by 2.0 cm on image 24 series 4, previously 2.3 by 1.8 cm when measured in a  similar fashion. Centrilobular and paraseptal emphysema. Stable scarring along the minor fissure. Stable scarring in the posterior basal segment right lower lobe. Calcified granuloma posteriorly in the right upper lobe. Increased bilobed pleural nodularity anteriorly along the left upper lobe measuring 0.8 by 0.3 cm on image 40 series 4. Musculoskeletal: Dextroconvex thoracic scoliosis. CT ABDOMEN PELVIS FINDINGS Hepatobiliary: Unremarkable Pancreas: Unremarkable Spleen: Unremarkable Adrenals/Urinary Tract: Indistinct fullness of both adrenal glands partially blurred by motion artifact. Bilateral renal atrophy. Left mid kidney cyst. Thickened right posterior urinary bladder wall, no change from prior, sessile tumor not excluded. Stomach/Bowel: Descending and sigmoid colon diverticulosis. A margin of small bowel extends into a small direct right inguinal hernia without complicating feature. Vascular/Lymphatic: Aortoiliac atherosclerotic vascular disease. Fusiform infrarenal abdominal aortic aneurysm 3.4 cm in diameter, previously measured at 3.3 cm. Right gastric lymph node 1.1 cm in short axis on image 61 series 2, formerly not well seen. Scattered upper normal sized left periaortic lymph nodes. Reproductive: Brachytherapy seed implants in the prostate gland. As before, gas density from the anorectal region extends further anteriorly into the vicinity of the prostate that would otherwise be expected. Other: No supplemental non-categorized findings. Musculoskeletal: There is a suggestion of minimal bilateral chronic hip avascular necrosis with sclerosis anteriorly in both femoral heads. Lumbar spondylosis and degenerative disc disease causing bilateral foraminal impingement at L5-S1. IMPRESSION: 1. Enlarging left upper lobe nodule and worsening adenopathy in the chest and upper abdomen, compatible with progressive malignancy. 2. Infrarenal abdominal aortic aneurysm 3.4 cm in diameter. Recommend follow-up ultrasound  every 3 years. This recommendation follows ACR consensus guidelines: White Paper of the ACR Incidental Findings Committee II on Vascular Findings. J Am Coll Radiol 2013; 10:789-794. 3. Considerable atherosclerosis with chronic stable findings in the chest including a small focal dissection at the origin of the left common carotid artery, and an outpouching of the left lateral side of the descending thoracic aorta potentially from saccular aneurysm or chronic pseudoaneurysm. There is also a web like residuum from prior remote pulmonary embolus in the left lower lobe. No acute pulmonary embolus. 4. Complex small left pleural effusion, likely malignant. 5. Thickened right posterior urinary bladder wall, stable, sessile tumor not excluded. 6. Stable gas density along the anterior anorectal margin extending into the vicinity the prostate gland, significance uncertain. Brachytherapy seeds are present in the prostate gland. 7. Other imaging findings of potential clinical significance: Aortic Atherosclerosis (ICD10-I70.0). Coronary atherosclerosis. Mild cardiomegaly. Emphysema (ICD10-J43.9). Descending and sigmoid colon diverticulosis. Chronic bilateral hip avascular necrosis (mild). Bilateral foraminal impingement at L5-S1. Small direct right inguinal hernia appears stable and contains a small loop of small bowel without complicating feature. Electronically Signed   By: Van Clines M.D.   On: 08/22/2020 09:49     Assessment and plan  1. Primary adenocarcinoma of left lung ([place])   2. ESRD (end stage renal disease) ([place])   3. Malignant pleural effusion   4. Subclinical hypothyroidism   5. Other fatigue   6. 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  Proceed with Keytruda today. CT chest abdomen pelvis results were available after his last visit. Images were independently reviewed by me.  Enlarging left upper lobe nodule and  worsening adenopathy in the chest,  upper abdomen.  Compatible with progressive malignancy.  Infra abdominal aortic aneurysm 3.4 cm.  Atherosclerosis.  Complex small left pleural effusion. Thickened right posterior urinary bladder wall, stable, Stable gas density along the anterior anorectal margin extending into the vicinity the prostate gland, significance uncertain.Brachytherapy seeds are present in the prostate gland I discussed with patient about need of chemotherapy carboplatin Taxol  Given his overall performance status, multiple other medical problems, I will switch to single agent carboplatin  regimen any further titrate chemotherapy regimen if he tolerates.  I discussed about the rationale and potential side effects of chemotherapy and he agrees with the treatment.   #Fatigue, possibly due to multiple comorbidities, and subclinical hypothyroidism. I recommend patient to start taking levothyroxine 50 MCG daily.  #Productive cough with thick mucus.  Recommend Mucinex, also start empiric azithromycin for possible bronchitis. ESRD on hemodialysis, continue follow-up with nephrology. Difficulty emptying bladder, history of prostate cancer.  Continue Flomax.  Weight loss, continue follow-up with nutritionist.  Continue protein shake on dialysis days. He has gained weight  Follow-up 1 week for evaluation of symptoms.  2 weeks   for evaluation prior to lab MD Taxol  Jose Server, MD, PhD Hematology Oncology [facility] Pager- [phone] 09/01/2020

## 2020-09-01 NOTE — Progress Notes (Signed)
Pt thinks he may need some home health

## 2020-09-01 NOTE — Progress Notes (Signed)
[facility], [place] 09/01/20  Subjective:   LOS: 0 Patient presented to the ER for generalized weakness.  He states that he is now not able to walk as good.  He has been missing toes on his right foot.  He has some lower extremity edema.  No shortness of breath.  Appetite is low.  No nausea or vomiting.  No diarrhea.  No fever.  Gets dialysis from the left arm AVF  Last dialysis was on Thursday. + cough Phlegm Able to walk with a walker at home   Objective:  Vital signs in last 24 hours:  Temp:  [97.5 F (36.4 C)] 97.5 F (36.4 C) (04/02 1043) Pulse Rate:  [69-79] 72 (04/02 1256) Resp:  [12-18] 18 (04/02 1256) BP: (141-148)/(87-90) 141/90 (04/02 1256) SpO2:  [92 %-100 %] 100 % (04/02 1256)  Weight change:  There were no vitals filed for this visit.  Intake/Output:   No intake or output data in the 24 hours ending 09/01/20 1342  Physical Exam: General:  No acute distress, laying in the bed  HEENT  anicteric, moist oral mucous membrane  Pulm/lungs  normal breathing effort, mild basilar crackles, room air  CVS/Heart  regular rhythm, no rub or gallop  Abdomen:   Soft, nontender  Extremities:  + peripheral edema  Neurologic:  Alert, oriented, able to follow commands  Skin:  No acute rashes  Left arm aneursmal AVF     Basic Metabolic Panel:  Recent Labs  Lab 08/30/20 1827 09/01/20 1049  NA 136 134*  K 3.6 3.6  CL 94* 93*  CO2 29 27  GLUCOSE 89 123*  BUN 20 36*  CREATININE 5.32* 7.73*  CALCIUM 9.0 9.6     CBC: Recent Labs  Lab 08/30/20 1827 09/01/20 1049  WBC 8.9 8.1  NEUTROABS 6.8  --   HGB 13.8 14.3  HCT 41.3 43.0  MCV 96.5 98.2  PLT 138* 133*      Lab Results  Component Value Date   HEPBSAG NON REACTIVE 11/01/2019   HEPBSAB Reactive 03/22/2018   HEPBIGM Negative 03/22/2018      Microbiology:  No results found for this or any previous visit (from the past 240 hour(s)).  Coagulation Studies: No results for  input(s): LABPROT, INR in the last 72 hours.  Urinalysis: No results for input(s): COLORURINE, LABSPEC, PHURINE, GLUCOSEU, HGBUR, BILIRUBINUR, KETONESUR, PROTEINUR, UROBILINOGEN, NITRITE, LEUKOCYTESUR in the last 72 hours.  Invalid input(s): APPERANCEUR    Imaging: DG Chest 2 View  Result Date: 09/01/2020 CLINICAL DATA:  Cough with weakness 1 week. EXAM: CHEST - 2 VIEW COMPARISON:  08/30/2020 and 06/05/2020 as well as CT chest 08/30/2020 FINDINGS: Lungs are adequately inflated with slight worsening hazy density over the left base/retrocardiac region with blunting of the left costophrenic angle as findings may be due to effusion/atelectasis versus infection. Subtle hazy prominence of the perihilar vessels unchanged. Stable cardiomegaly. Remainder of the exam is unchanged. IMPRESSION: 1. Slight worsening hazy density over the left base/retrocardiac region which may be due to effusion/atelectasis versus infection. 2. Stable cardiomegaly with suggestion of minimal vascular congestion. Electronically Signed   By: Marin Olp M.D.   On: 09/01/2020 12:31   DG Chest 2 View  Result Date: 08/30/2020 CLINICAL DATA:  Cough and weakness EXAM: CHEST - 2 VIEW COMPARISON:  June 05, 2020 FINDINGS: There is airspace opacity in portions of each mid lung and left lower lobe with left pleural effusion. There is cardiomegaly with pulmonary vascularity within normal  limits. No adenopathy. No bone lesions. IMPRESSION: Airspace opacity consistent with pneumonia in the left lower lung region as well as in each mid lung region. Check of COVID-19 status may be prudent in this circumstance. Left pleural effusion noted.  Stable cardiomegaly. Electronically Signed   By: Lowella Grip III M.D.   On: 08/30/2020 18:02   CT Chest Wo Contrast  Result Date: 08/30/2020 CLINICAL DATA:  Pneumonia, effusion or abscess suspected, xray done Generalized weakness for 2 weeks. Radiologic records demonstrates history of lung cancer.  EXAM: CT CHEST WITHOUT CONTRAST TECHNIQUE: Multidetector CT imaging of the chest was performed following the standard protocol without IV contrast. COMPARISON:  Radiograph earlier today. Staging chest CT 8 days ago 08/21/2020 FINDINGS: Cardiovascular: Aortic atherosclerosis and tortuosity. Focal outpouching of the left lateral wall of thoracic aorta on prior exam is grossly stable, but not well assessed on the current exam in the absence of IV contrast. No periaortic stranding. Multi chamber cardiomegaly. Coronary artery calcifications. Small pericardial effusion is similar Mediastinum/Nodes: Patient had recent staging CT with IV contrast the. Right paratracheal lymph node measuring right lower paratracheal node measuring 2.3 cm, series 2, image 50, unchanged allowing for differences in technique and caliper placement. Subcarinal node measures 2.1 cm, series 2, image 71, previously 1.9 cm. Additional smaller lymph nodes are unchanged from prior exam. Hilar adenopathy is not well assessed on this noncontrast exam. Left epicardial node measures 1.2 cm, series 2, image 139, previously 1.1 cm. No obvious no adenopathy in the interim. Decompressed esophagus. Lungs/Pleura: Partially loculated left pleural effusion at the lung base this is not significantly changed in size. There is left lower lobe bronchial thickening. No definite acute airspace disease. There is mild scarring abutting the inter lobar fissure on the left which appears similar to prior exam. Spiculated left upper lobe pulmonary nodule measures 2.5 x 2 cm, series 3, image 27, unchanged. Spiculations extend to the pleural surface anterior laterally. Background emphysema. Patchy ground-glass opacity in the perifissural right upper lobe, series 3, image 66, with adjacent fissural thickening, progressed/new. Additional a ground-glass opacity in the perihilar right lower lobe. Slight basilar septal thickening. There is a trace right pleural effusion and seen on  prior exam. Upper Abdomen: Bilateral renal parenchymal atrophy. Diverticulosis of the included colon. No acute upper abdominal findings. Musculoskeletal: There are no acute or suspicious osseous abnormalities. IMPRESSION: 1. Patchy ground-glass opacity in the perifissural right upper and to a lesser extent right lower lobe. Differential considerations include pneumonitis including COVID-19 versus pulmonary edema. Small right pleural effusion is new from CT 1 week ago. 2. Unchanged partially loculated left pleural effusion at the lung base. 3. Unchanged spiculated left upper lobe pulmonary nodule. Unchanged mediastinal adenopathy. Patient recently underwent staging CT. 4. Cardiomegaly with coronary artery calcifications. Small pericardial effusion is unchanged. Aortic Atherosclerosis (ICD10-I70.0) and Emphysema (ICD10-J43.9). Electronically Signed   By: Keith Rake M.D.   On: 08/30/2020 20:01     Medications:    Scheduled Meds: Continuous Infusions: PRN Meds:.    Assessment/ Plan:  75 y.o. male with  was admitted on 09/01/2020 for  Active Problems:   Type II diabetes mellitus with renal manifestations (HCC)   ESRD on dialysis ([place])   Gangrene of toe of right foot (HCC)   Atherosclerosis of native arteries of the extremities with gangrene (HCC)   HLD (hyperlipidemia)   Stroke ([place])   Depression   Tobacco abuse   Chronic diastolic CHF (congestive heart failure) ([place])   Primary adenocarcinoma of  left lung (HCC)   Malignant pleural effusion  Weakness x 2 weeks   [facility] TuThSa-2  wt 66.5 kg  #. ESRD Normally dialyzes at [facility] on TTS schedule We will plan for hemodialysis today and TTS schedule while in the hospital  #. Anemia of CKD  Lab Results  Component Value Date   HGB 14.3 09/01/2020   Avoid Epogen as patient has known malignancy Given chemotherapy and is followed by Dr. Tasia Catchings at the cancer center  #. Secondary hyperparathyroidism of renal  origin N 25.81      Component Value Date/Time   PTH 226 (H) 03/25/2018 1557   Lab Results  Component Value Date   PHOS 4.8 (H) 11/05/2019   Monitor calcium and phos level during this admission   #.  Generalized weakness Plan as per hospitalist May need physical therapy evaluation    LOS: 0 Merida Alcantar 4/2/20221:42 PM  [facility] [employer], [place]

## 2020-09-01 NOTE — Progress Notes (Signed)
DISCONTINUE ON PATHWAY REGIMEN - Non-Small Cell Lung     A cycle is 21 days:     Pembrolizumab   **Always confirm dose/schedule in your pharmacy ordering system**  REASON: Disease Progression PRIOR TREATMENT: [id number]: Pembrolizumab 200 mg q21 Days Until Disease Progression, Unacceptable Toxicity, or up to 24 Months TREATMENT RESPONSE: Progressive Disease (PD)  START ON PATHWAY REGIMEN - Non-Small Cell Lung     A cycle is every 21 days:     Paclitaxel      Carboplatin   **Always confirm dose/schedule in your pharmacy ordering system**  Patient Characteristics: Stage IV Metastatic, Nonsquamous, Molecular Analysis Completed, Molecular Alteration Present and Targeted Therapy Exhausted OR EGFR Exon 20+ or KRAS G12C+ Present and No Prior Chemo/Immunotherapy OR No Alteration Present, Initial  Chemotherapy/Immunotherapy, PS = 2, No Alteration Present, No Alteration Present, PD-L1 Expression Positive 1-49% (TPS) / Negative / Not Tested / Awaiting Test Results Therapeutic Status: Stage IV Metastatic Histology: Nonsquamous Cell Broad Molecular Profiling Status: Molecular Analysis Completed Molecular Analysis Results: No Alteration Present ECOG Performance Status: 2 Chemotherapy/Immunotherapy Line of Therapy: Initial Chemotherapy/Immunotherapy EGFR Exons 18-21 Mutation Testing Status: Completed and Negative ALK Fusion/Rearrangement Testing Status: Completed and Negative BRAF V600 Mutation Testing Status: Completed and Negative KRAS G12C Mutation Testing Status: Completed and Negative MET Exon 14 Mutation Testing Status: Completed and Negative RET Fusion/Rearrangement Testing Status: Completed and Negative NTRK Fusion/Rearrangement Testing Status: Completed and Negative ROS1 Fusion/Rearrangement Testing Status: Completed and Negative PD-L1 Expression Status: Awaiting Test Results Intent of Therapy: Non-Curative / Palliative Intent, Discussed with Patient

## 2020-09-01 NOTE — ED Triage Notes (Signed)
Patient c/o weakness X1week. Was seen in ER 08/30/20 for same. Dialysis patient with access on left arm

## 2020-09-01 NOTE — ED Notes (Signed)
Pt sleeping in NAD. Awoken easily. Denies any needs.

## 2020-09-01 NOTE — Progress Notes (Signed)
[facility]  Pt ran 2 hrs 37 minutes of 3.5 ordered. Hign venous pressures, leading to additional cannulation of venous site. Then arterial pressures started to rise. Prolonged bleeding from both site during deaccess.   Elberta Leatherwood

## 2020-09-01 NOTE — ED Provider Notes (Signed)
[facility] Emergency Department Provider Note   ____________________________________________   Event Date/Time   First MD Initiated Contact with Patient 09/01/20 1047     (approximate)  I have reviewed the triage vital signs and the nursing notes.   HISTORY  Chief Complaint Weakness    HPI Jose [name] is a 75 y.o. male history of end-stage renal disease on dialysis, diabetes, lung cancer  Patient reports that for the last 2 weeks he has had this increasing feeling of weakness also a little bit short of breath with a slight cough and very fatigued.  Now to the point that he really feels like he just cannot even get up and walk without feeling too weak or needing assistance  His changes slowly worsened over about the last 1 to 2 weeks.  Reports he came to the hospital a few days ago to get checked out for the same but it just keeps getting worse.  Patient does get dialysis, reports he thinks he last had dialysis on Thursday but honestly he is not quite sure.  No leg swelling.  Slight shortness of breath feels very fatigued   Past Medical History:  Diagnosis Date  . Chronic kidney disease   . Depression   . Diabetes mellitus without complication ([place])   . Glaucoma   . Glaucoma   . Gout   . Heart murmur   . HLD (hyperlipidemia)   . Hypertension   . Primary adenocarcinoma of left lung ([place])   . Prostate cancer ([place])   . Renal failure    dialysis t/t/s  . Renal insufficiency   . Stroke ([place])    tia x 2    Patient Active Problem List   Diagnosis Date Noted  . Hypervolemia   . Weakness   . Hypothyroidism   . Physical deconditioning 09/01/2020  . Weight loss 07/11/2020  . Encounter for antineoplastic immunotherapy 02/15/2020  . Goals of care, counseling/discussion 12/23/2019  . 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 Malignant neoplasm of upper lobe of left lung ([place]) 12/23/2019  . Thickening of wall of gallbladder   . Primary  adenocarcinoma of left lung ([place])   . Hypokalemia 10/30/2019  . Chronic diastolic CHF (congestive heart failure) ([place]) 10/30/2019  . Benign essential HTN 10/30/2019  . Malignant neoplasm of unknown origin ([place]) 10/30/2019  . Acute respiratory failure with hypoxia ([place]) 10/30/2019  . End-stage renal disease on hemodialysis ([place]) 10/30/2019  . Type 2 diabetes mellitus with hyperlipidemia ([place]) 10/30/2019  . SOB (shortness of breath) 10/29/2019  . Pulmonary edema 10/22/2019  . Diarrhea 10/22/2019  . Acute on chronic diastolic CHF (congestive heart failure) ([place]) 10/22/2019  . HLD (hyperlipidemia)   . Stroke ([place])   . Anxiety and depression   . Tobacco abuse   . Pleural effusion on left   . Acute urinary retention   . Foot ulcer ([place]) 12/15/2018  . Ischemic foot 10/11/2018  . Atherosclerosis of native arteries of the extremities with gangrene ([place]) 07/09/2018  . Gangrene of toe of right foot ([place]) 06/13/2018  . Type II diabetes mellitus with renal manifestations ([place]) 04/16/2018  . Hypertension 04/16/2018  . ESRD on dialysis ([place]) 04/16/2018  . Malnutrition of moderate degree 03/27/2018  . Renal failure 03/21/2018    Past Surgical History:  Procedure Laterality Date  . AMPUTATION TOE Bilateral 07/23/2018   Procedure: TOE MPJ RIGHT 3RD AND LEFT 2ND;  Surgeon: Samara Deist, DPM;  Location: [facility] ORS;  Service: Podiatry;  Laterality:  Bilateral;  . APPENDECTOMY    . AV FISTULA PLACEMENT Left 06/09/2018   Procedure: ARTERIOVENOUS (AV) FISTULA CREATION ( BRACHIAL CEPHALIC);  Surgeon: Algernon Huxley, MD;  Location: [facility] ORS;  Service: Vascular;  Laterality: Left;  . CHOLECYSTECTOMY    . DIALYSIS/PERMA CATHETER INSERTION N/A 03/24/2018   Procedure: DIALYSIS/PERMA CATHETER INSERTION;  Surgeon: Katha Cabal, MD;  Location: [place] [facility];  Service: Cardiovascular;  Laterality: N/A;  . DIALYSIS/PERMA CATHETER REMOVAL N/A 09/06/2018   Procedure: DIALYSIS/PERMA CATHETER REMOVAL;   Surgeon: Algernon Huxley, MD;  Location: [place] [facility];  Service: Cardiovascular;  Laterality: N/A;  . INSERTION PROSTATE RADIATION SEED    . IR PERC PLEURAL DRAIN W/INDWELL CATH W/IMG GUIDE  11/03/2019  . IRRIGATION AND DEBRIDEMENT FOOT Right 12/17/2018   Procedure: IRRIGATION AND DEBRIDEMENT FOOT;  Surgeon: Samara Deist, DPM;  Location: [facility];  Service: Podiatry;  Laterality: Right;  . LOWER EXTREMITY ANGIOGRAPHY Right 06/16/2018   Procedure: LOWER EXTREMITY ANGIOGRAPHY;  Surgeon: Algernon Huxley, MD;  Location: [place] [facility];  Service: Cardiovascular;  Laterality: Right;  . LOWER EXTREMITY ANGIOGRAPHY Left 08/30/2018   Procedure: LOWER EXTREMITY ANGIOGRAPHY;  Surgeon: Algernon Huxley, MD;  Location: [place] [facility];  Service: Cardiovascular;  Laterality: Left;  . LOWER EXTREMITY ANGIOGRAPHY Right 09/30/2018   Procedure: LOWER EXTREMITY ANGIOGRAPHY;  Surgeon: Algernon Huxley, MD;  Location: [place] [facility];  Service: Cardiovascular;  Laterality: Right;  . LOWER EXTREMITY ANGIOGRAPHY Left 10/07/2018   Procedure: LOWER EXTREMITY ANGIOGRAPHY;  Surgeon: Algernon Huxley, MD;  Location: [place] [facility];  Service: Cardiovascular;  Laterality: Left;  . LOWER EXTREMITY ANGIOGRAPHY Right 12/20/2018   Procedure: Lower Extremity Angiography;  Surgeon: Algernon Huxley, MD;  Location: [place] [facility];  Service: Cardiovascular;  Laterality: Right;  . TRANSMETATARSAL AMPUTATION Bilateral 10/13/2018   Procedure: 1.  Amputation right great toe MTPJ 2.  Amputation right second toe MTPJ 3.  Amputation right fourth toe MTPJ 4.  Amputation right fifth toe MTPJ 5.  Excision distal second metatarsal head right foot 6.  Excision distal third metatarsal head right foot 7.  Amputation left third toe 8.  Incision and drainage infectionleft 2nd toe amputation site. ;  Surgeon: Samara Deist, DPM;  Location    Prior to Admission medications   Medication Sig Start Date End Date Taking? Authorizing Provider   levothyroxine (SYNTHROID) 50 MCG tablet Take 1 tablet (50 mcg total) by mouth daily before breakfast. 08/31/20  Yes Earlie Server, MD  nicotine (NICODERM CQ - DOSED IN MG/24 HOURS) 21 mg/24hr patch Place 1 patch (21 mg total) onto the skin daily. 11/06/19  Yes Sheikh, Omair Latif, DO  oxyCODONE (OXY IR/ROXICODONE) 5 MG immediate release tablet Take 1 tablet (5 mg total) by mouth every 6 (six) hours as needed for severe pain. 11/05/19  Yes Sheikh, Omair Latif, DO  tamsulosin (FLOMAX) 0.4 MG CAPS capsule Take 1 capsule (0.4 mg total) by mouth daily. 08/01/20  Yes Earlie Server, MD  traMADol (ULTRAM) 50 MG tablet 50 mg. 03/16/19  Yes [provider]  allopurinol (ZYLOPRIM) 100 MG tablet Take 1 tablet (100 mg total) by mouth daily. 10/24/19   Nicole Kindred A, DO  amLODipine (NORVASC) 5 MG tablet Take 10 mg by mouth daily.    [provider]  aspirin 81 MG EC tablet Take 1 tablet (81 mg total) by mouth daily. 10/24/19   Nicole Kindred A, DO  atorvastatin (LIPITOR) 10 MG tablet Take  1 tablet (10 mg total) by mouth daily at 6 PM. 10/24/19   Ezekiel Slocumb, DO  azithromycin (ZITHROMAX) 250 MG tablet 2 tabs (500mg ) once, then 1 tab (250 mg) daily x4 days 08/31/20   Earlie Server, MD  calcium acetate (PHOSLO) 667 MG capsule Take 2 capsules (1,334 mg total) by mouth 3 (three) times daily with meals. Patient not taking: No sig reported 12/21/18   Fritzi Mandes, MD  clopidogrel (PLAVIX) 75 MG tablet Take 1 tablet (75 mg total) by mouth daily. 10/24/19   Ezekiel Slocumb, DO  diphenoxylate-atropine (LOMOTIL) 2.5-0.025 MG tablet Take 1 tablet by mouth every 12 (twelve) hours as needed for diarrhea or loose stools. Patient not taking: No sig reported 02/15/20   Earlie Server, MD  escitalopram (LEXAPRO) 10 MG tablet Take 1 tablet (10 mg total) by mouth daily. 10/24/19   Nicole Kindred A, DO  gabapentin (NEURONTIN) 300 MG capsule Take 300 mg by mouth 3 (three) times daily.    [provider]  glipiZIDE (GLUCOTROL) 5 MG  tablet Take by mouth daily before breakfast.    [provider]  guaiFENesin (MUCINEX) 600 MG 12 hr tablet Take 1 tablet (600 mg total) by mouth 2 (two) times daily. 06/20/20   Earlie Server, MD  guaiFENesin (MUCINEX) 600 MG 12 hr tablet Take 1 tablet (600 mg total) by mouth 2 (two) times daily. 08/31/20   Earlie Server, MD  ondansetron (ZOFRAN) 4 MG tablet Take 1 tablet (4 mg total) by mouth every 6 (six) hours as needed for nausea. 11/05/19   Raiford Noble Latif, DO    Allergies Penicillin g  Family History  Problem Relation Age of Onset  . Varicose Veins Neg Hx     Social History Social History   Tobacco Use  . Smoking status: Current Some Day Smoker    Packs/day: 0.10    Types: Cigarettes  . Smokeless tobacco: Never Used  . Tobacco comment: pt currently smokes about 3 cigarettes a day   Vaping Use  . Vaping Use: Never used  Substance Use Topics  . Alcohol use: Not Currently    Alcohol/week: 0.0 standard drinks  . Drug use: Never    Review of Systems Constitutional: No fever/chills Eyes: No visual changes. ENT: No sore throat. Cardiovascular: Denies chest pain. Respiratory: Denies shortness of breath. Gastrointestinal: No abdominal pain.   Genitourinary: Negative for dysuria. Musculoskeletal: Negative for back pain. Skin: Negative for rash. Neurological: Negative for headaches, areas of focal weakness or numbness.    ____________________________________________   PHYSICAL EXAM:  VITAL SIGNS: ED Triage Vitals  Enc Vitals Group     BP 09/01/20 1043 (!) 148/87     Pulse Rate 09/01/20 1043 79     Resp 09/01/20 1043 14     Temp 09/01/20 1043 (!) 97.5 F (36.4 C)     Temp Source 09/01/20 1043 Oral     SpO2 09/01/20 1043 98 %     Weight --      Height --      Head Circumference --      Peak Flow --      Pain Score 09/01/20 1042 0     Pain Loc --      Pain Edu? --      Excl. in [place]? --     Constitutional: Alert and oriented.  Fatigued, generally weak but in  no acute distress. Eyes: Conjunctivae are normal. Head: Atraumatic. Nose: No congestion/rhinnorhea. Mouth/Throat: Mucous membranes are slightly dry.  Neck: No stridor.  Cardiovascular: Normal rate, regular rhythm. Grossly normal heart sounds.  Good peripheral circulation. Respiratory: Normal respiratory effort.  No retractions. Lungs CTAB. Gastrointestinal: Soft and nontender. No distention. Musculoskeletal: No lower extremity tenderness has some mild bilateral lower extremity edema.  Patient appears generally very deconditioned.  No focal deficits however. Neurologic:  Normal speech and language. No gross focal neurologic deficits are appreciated.  Skin:  Skin is warm, dry and intact. No rash noted. Psychiatric: Mood and affect are normal. Speech and behavior are normal.  ____________________________________________   LABS (all labs ordered are listed, but only abnormal results are displayed)  Labs Reviewed  BASIC METABOLIC PANEL - Abnormal; Notable for the following components:      Result Value   Sodium 134 (*)    Chloride 93 (*)    Glucose, Bld 123 (*)    BUN 36 (*)    Creatinine, Ser 7.73 (*)    GFR, Estimated 7 (*)    All other components within normal limits  CBC - Abnormal; Notable for the following components:   RDW 16.3 (*)    Platelets 133 (*)    All other components within normal limits  BRAIN NATRIURETIC PEPTIDE - Abnormal; Notable for the following components:   B Natriuretic Peptide >4,500.0 (*)    All other components within normal limits  HEMOGLOBIN A1C - Abnormal; Notable for the following components:   Hgb A1c MFr Bld 6.3 (*)    All other components within normal limits  GLUCOSE, CAPILLARY - Abnormal; Notable for the following components:   Glucose-Capillary 131 (*)    All other components within normal limits  GLUCOSE, CAPILLARY - Abnormal; Notable for the following components:   Glucose-Capillary 102 (*)    All other components within normal limits   GLUCOSE, CAPILLARY - Abnormal; Notable for the following components:   Glucose-Capillary 210 (*)    All other components within normal limits  GLUCOSE, CAPILLARY - Abnormal; Notable for the following components:   Glucose-Capillary 130 (*)    All other components within normal limits  GLUCOSE, CAPILLARY - Abnormal; Notable for the following components:   Glucose-Capillary 120 (*)    All other components within normal limits  RESP PANEL BY RT-PCR (FLU A&B, COVID) [id number]  MRSA PCR SCREENING  PROCALCITONIN  HEPATITIS B SURFACE ANTIGEN  PHOSPHORUS  URINALYSIS, COMPLETE (UACMP) WITH MICROSCOPIC  HEPATITIS B DNA, ULTRAQUANTITATIVE, PCR  HEPATITIS B SURFACE ANTIBODY, QUANTITATIVE  CBG MONITORING, ED   ____________________________________________  EKG  Reviewed entered by me at 1045 Heart rate 89 QRS 189 QTc 540 Left bundle branch block, first-degree AV block. ____________________________________________  RADIOLOGY  DG Chest 2 View  Result Date: 09/01/2020 CLINICAL DATA:  Cough with weakness 1 week. EXAM: CHEST - 2 VIEW COMPARISON:  08/30/2020 and 06/05/2020 as well as CT chest 08/30/2020 FINDINGS: Lungs are adequately inflated with slight worsening hazy density over the left base/retrocardiac region with blunting of the left costophrenic angle as findings may be due to effusion/atelectasis versus infection. Subtle hazy prominence of the perihilar vessels unchanged. Stable cardiomegaly. Remainder of the exam is unchanged. IMPRESSION: 1. Slight worsening hazy density over the left base/retrocardiac region which may be due to effusion/atelectasis versus infection. 2. Stable cardiomegaly with suggestion of minimal vascular congestion. Electronically Signed   By: Marin Olp M.D.   On: 09/01/2020 12:31   Chest x-ray reviewed, slightly worsening density over left base retrocardiac region. ____________________________________________   PROCEDURES  Procedure(s) performed:  None  Procedures  Critical  Care performed: No  ____________________________________________   INITIAL IMPRESSION / ASSESSMENT AND PLAN / ED COURSE  Pertinent labs & imaging results that were available during my care of the patient were reviewed by me and considered in my medical decision making (see chart for details).   Patient presents with what appears to be generalized increasing fatigue.  Associated with decreased mobility, deconditioning.  Patient is also a hemodialysis patient, based on my clinical assessment and evaluation I am also suspicious the patient may be mildly volume overloaded.  This is a second recent visit to the emergency department for similar symptoms and he appears to be worsening day over day.  Clinical Course as of 09/03/20 0024  Sat Sep 01, 2020  1152 Reports had dialysis Thursday.  [MQ]  1152 2 weeks of fatigue [MQ]  1317 Discussed with Dr. Candiss Norse, patient last HD was Thursday [MQ]  1321 Discussed reviewed clinical history imaging with Dr. Candiss Norse.  Advises will provide consultation, likely plan to have hemodialysis performed today [MQ]    Clinical Course User Index [MQ] [name], MD    Patient's work-up is notable for a normal white count, slightly elevated procalcitonin, and BNP that is significantly elevated greater than 4500.  I suspect likely volume overload.  Cannot necessarily exclude infection but my suspicion is somewhat low afebrile, normal white count.  Appears volume overloaded.  As the patient's mobility is worsening and his fatigue is significantly increasing, along with his volume overload status discussed with the patient I does not seem that discharged home is a reasonable option.  Plan to add dissipate admission, discussed with Dr. Rhunette Croft stat.  Will admit for volume overload, deconditioning, further care and work-up under the hospitalist service with nephrology consult ____________________________________________   FINAL CLINICAL  IMPRESSION(S) / ED DIAGNOSES  Final diagnoses:  Weakness  Generalized weakness  Hypervolemia, unspecified hypervolemia type        Note:  This document was prepared using Dragon voice recognition software and may include unintentional dictation errors       [name], MD 09/03/20 0024

## 2020-09-01 NOTE — H&P (Signed)
[facility] History and Physical  AUBURN HERT VVK:[id number] DOB: Jun 02, 1946 DOA: 09/01/2020  Referring physician: Dr. Jacqualine Code PCP: [name], MD   Chief Complaint: weakness  HPI: Jose [name] is a 75 y.o. male with history of ESRD on dialysis, type 2 diabetes, significant PVD, CVA, hyperlipidemia, hypertension, chronic diastolic CHF, and adenocarcinoma of the left lung currently undergoing palliative chemotherapy, who presents with weakness.  Patient last seen by oncologist Dr. Tasia Catchings yesterday.  At that time plan to proceed with single agent carboplatin.  On my history reports that he has been feeling progressively weaker over the past 2 weeks.  At baseline he is normally ambulatory and able to get around town on the bus.  Over these past 2 weeks he has become unable to walk due to a combination of feeling weak in his legs and also getting very fatigued when he does.  He denies any shortness of breath or chest pain.  Normally coughs up phlegm and has had no change from his baseline.  He lives with his sister who is 47 years old and in good health.  He reports he has not taken any of his regular medications for the past 2 months because his nephew threw them away for no apparent reason and he has been unable to obtain new medicines.  In the ED initial vital signs unremarkable.  Lab work-up notable for BMP with creatinine of 7.7, CBC notable only for mild thrombocytopenia at 133, BNP greater than assay.  Chest x-ray was obtained which showed stable cardiomegaly and suggestion of vascular congestion as well as worsening haziness at the left base consistent with previously seen loculated malignant left pleural effusion.   He was admitted with plan to dialyze off his fluid and obtain PT OT evaluations for deconditioning.  Review of Systems:  Pertinent positives and negative per HPI, all others reviewed and negative  Past Medical History:  Diagnosis Date  . Chronic kidney disease    . Depression   . Diabetes mellitus without complication ([place])   . Glaucoma   . Glaucoma   . Gout   . Heart murmur   . HLD (hyperlipidemia)   . Hypertension   . Primary adenocarcinoma of left lung ([place])   . Prostate cancer ([place])   . Renal failure    dialysis t/t/s  . Renal insufficiency   . Stroke ([place])    tia x 2   Past Surgical History:  Procedure Laterality Date  . AMPUTATION TOE Bilateral 07/23/2018   Procedure: TOE MPJ RIGHT 3RD AND LEFT 2ND;  Surgeon: Samara Deist, DPM;  Location: [facility];  Service: Podiatry;  Laterality: Bilateral;  . APPENDECTOMY    . AV FISTULA PLACEMENT Left 06/09/2018   Procedure: ARTERIOVENOUS (AV) FISTULA CREATION ( BRACHIAL CEPHALIC);  Surgeon: Algernon Huxley, MD;  Location: [facility];  Service: Vascular;  Laterality: Left;  . CHOLECYSTECTOMY    . DIALYSIS/PERMA CATHETER INSERTION N/A 03/24/2018   Procedure: DIALYSIS/PERMA CATHETER INSERTION;  Surgeon: Katha Cabal, MD;  Location: [place] [facility];  Service: Cardiovascular;  Laterality: N/A;  . DIALYSIS/PERMA CATHETER REMOVAL N/A 09/06/2018   Procedure: DIALYSIS/PERMA CATHETER REMOVAL;  Surgeon: Algernon Huxley, MD;  Location: [place] [facility];  Service: Cardiovascular;  Laterality: N/A;  . INSERTION PROSTATE RADIATION SEED    . IR PERC PLEURAL DRAIN W/INDWELL CATH W/IMG GUIDE  11/03/2019  . IRRIGATION AND DEBRIDEMENT FOOT Right 12/17/2018   Procedure: IRRIGATION AND DEBRIDEMENT FOOT;  Surgeon: Samara Deist, DPM;  Location: [facility];  Service: Podiatry;  Laterality: Right;  . LOWER EXTREMITY ANGIOGRAPHY Right 06/16/2018   Procedure: LOWER EXTREMITY ANGIOGRAPHY;  Surgeon: Algernon Huxley, MD;  Location: [place] [facility];  Service: Cardiovascular;  Laterality: Right;  . LOWER EXTREMITY ANGIOGRAPHY Left 08/30/2018   Procedure: LOWER EXTREMITY ANGIOGRAPHY;  Surgeon: Algernon Huxley, MD;  Location: [place] [facility];  Service: Cardiovascular;  Laterality: Left;  . LOWER EXTREMITY ANGIOGRAPHY Right  09/30/2018   Procedure: LOWER EXTREMITY ANGIOGRAPHY;  Surgeon: Algernon Huxley, MD;  Location: [place] [facility];  Service: Cardiovascular;  Laterality: Right;  . LOWER EXTREMITY ANGIOGRAPHY Left 10/07/2018   Procedure: LOWER EXTREMITY ANGIOGRAPHY;  Surgeon: Algernon Huxley, MD;  Location: [place] [facility];  Service: Cardiovascular;  Laterality: Left;  . LOWER EXTREMITY ANGIOGRAPHY Right 12/20/2018   Procedure: Lower Extremity Angiography;  Surgeon: Algernon Huxley, MD;  Location: [place] [facility];  Service: Cardiovascular;  Laterality: Right;  . TRANSMETATARSAL AMPUTATION Bilateral 10/13/2018   Procedure: 1.  Amputation right great toe MTPJ 2.  Amputation right second toe MTPJ 3.  Amputation right fourth toe MTPJ 4.  Amputation right fifth toe MTPJ 5.  Excision distal second metatarsal head right foot 6.  Excision distal third metatarsal head right foot 7.  Amputation left third toe 8.  Incision and drainage infectionleft 2nd toe amputation site. ;  Surgeon: Samara Deist, DPM;  Location   Social History:  reports that he has been smoking cigarettes. He has been smoking about 0.10 packs per day. He has never used smokeless tobacco. He reports previous alcohol use. He reports that he does not use drugs.  Allergies  Allergen Reactions  . Penicillin G Other (See Comments)    Other reaction(s): Unknown DID THE REACTION INVOLVE: Swelling of the face/tongue/throat, SOB, or low BP? Unknown Sudden or severe rash/hives, skin peeling, or the inside of the mouth or nose? Unknown Did it require medical treatment? Unknown When did it last happen?unknown If all above answers are "NO", may proceed with cephalosporin use.     Family History  Problem Relation Age of Onset  . Varicose Veins Neg Hx      Prior to Admission medications   Medication Sig Start Date End Date Taking? Authorizing Provider  allopurinol (ZYLOPRIM) 100 MG tablet Take 1 tablet (100 mg total) by mouth daily. 10/24/19   Nicole Kindred  A, DO  amLODipine (NORVASC) 5 MG tablet Take 10 mg by mouth daily.    [provider]  aspirin 81 MG EC tablet Take 1 tablet (81 mg total) by mouth daily. 10/24/19   Ezekiel Slocumb, DO  atorvastatin (LIPITOR) 10 MG tablet Take 1 tablet (10 mg total) by mouth daily at 6 PM. 10/24/19   Ezekiel Slocumb, DO  azithromycin (ZITHROMAX) 250 MG tablet 2 tabs ($Remov'500mg'oKxiPh$ ) once, then 1 tab (250 mg) daily x4 days 08/31/20   Earlie Server, MD  calcium acetate (PHOSLO) 667 MG capsule Take 2 capsules (1,334 mg total) by mouth 3 (three) times daily with meals. Patient not taking: No sig reported 12/21/18   Fritzi Mandes, MD  clopidogrel (PLAVIX) 75 MG tablet Take 1 tablet (75 mg total) by mouth daily. 10/24/19   Ezekiel Slocumb, DO  diphenoxylate-atropine (LOMOTIL) 2.5-0.025 MG tablet Take 1 tablet by mouth every 12 (twelve) hours as needed for diarrhea or loose stools. Patient not taking: No sig reported 02/15/20   Earlie Server, MD  escitalopram (LEXAPRO) 10 MG tablet Take 1 tablet (  10 mg total) by mouth daily. 10/24/19   Nicole Kindred A, DO  gabapentin (NEURONTIN) 300 MG capsule Take 300 mg by mouth 3 (three) times daily.    [provider]  glipiZIDE (GLUCOTROL) 5 MG tablet Take by mouth daily before breakfast.    [provider]  guaiFENesin (MUCINEX) 600 MG 12 hr tablet Take 1 tablet (600 mg total) by mouth 2 (two) times daily. 06/20/20   Earlie Server, MD  guaiFENesin (MUCINEX) 600 MG 12 hr tablet Take 1 tablet (600 mg total) by mouth 2 (two) times daily. 08/31/20   Earlie Server, MD  levothyroxine (SYNTHROID) 50 MCG tablet Take 1 tablet (50 mcg total) by mouth daily before breakfast. 08/31/20   Earlie Server, MD  nicotine (NICODERM CQ - DOSED IN MG/24 HOURS) 21 mg/24hr patch Place 1 patch (21 mg total) onto the skin daily. 11/06/19   Sheikh, Omair Latif, DO  ondansetron (ZOFRAN) 4 MG tablet Take 1 tablet (4 mg total) by mouth every 6 (six) hours as needed for nausea. 11/05/19   Raiford Noble Latif, DO  oxyCODONE (OXY  IR/ROXICODONE) 5 MG immediate release tablet Take 1 tablet (5 mg total) by mouth every 6 (six) hours as needed for severe pain. Patient not taking: No sig reported 11/05/19   Raiford Noble Latif, DO  tamsulosin (FLOMAX) 0.4 MG CAPS capsule Take 1 capsule (0.4 mg total) by mouth daily. 08/01/20   Earlie Server, MD  traMADol (ULTRAM) 50 MG tablet 50 mg. Patient not taking: No sig reported 03/16/19   [provider]   Physical Exam: Vitals:   09/01/20 1130 09/01/20 1200 09/01/20 1230 09/01/20 1256  BP:    (!) 141/90  Pulse: 71 75 69 72  Resp: 12 14 17 18   Temp:      TempSrc:      SpO2: 92% 94% 98% 100%    Wt Readings from Last 3 Encounters:  08/31/20 72.5 kg  08/30/20 72.6 kg  08/22/20 70.3 kg     . General:  Appears calm and comfortable, chronically ill . Eyes: PERRL, normal lids, irises & conjunctiva . ENT: hoarse voice . Neck: no enlarged, firm R supraclavicular lymph node, no other cervical lymphadenopathy appreciated . Cardiovascular: RRR, no m/r/g. No LE edema. . Telemetry: SR, no arrhythmias  . Respiratory: Normal respiratory effort. No wheezes. Reduced breath sounds at L lung base. Coarse crackles at R lung base.  . Abdomen: soft, ntnd . Skin: no rash or induration seen on limited exam . Musculoskeletal: grossly normal tone BUE/BLE . Psychiatric: grossly normal mood and affect, speech fluent and appropriate . Neurologic: grossly non-focal.          Labs on Admission:  Basic Metabolic Panel: Recent Labs  Lab 08/30/20 1827 09/01/20 1049  NA 136 134*  K 3.6 3.6  CL 94* 93*  CO2 29 27  GLUCOSE 89 123*  BUN 20 36*  CREATININE 5.32* 7.73*  CALCIUM 9.0 9.6   Liver Function Tests: Recent Labs  Lab 08/30/20 1827  AST 19  ALT 13  ALKPHOS 36*  BILITOT 1.2  PROT 6.9  ALBUMIN 3.5   No results for input(s): LIPASE, AMYLASE in the last 168 hours. No results for input(s): AMMONIA in the last 168 hours. CBC: Recent Labs  Lab 08/30/20 1827 09/01/20 1049  WBC  8.9 8.1  NEUTROABS 6.8  --   HGB 13.8 14.3  HCT 41.3 43.0  MCV 96.5 98.2  PLT 138* 133*   Cardiac Enzymes: No results for  input(s): CKTOTAL, CKMB, CKMBINDEX, TROPONINI in the last 168 hours.  BNP (last 3 results) Recent Labs    10/29/19 2021 08/30/20 1749 09/01/20 1049  BNP 122.5* >4,500.0* >4,500.0*    ProBNP (last 3 results) No results for input(s): PROBNP in the last 8760 hours.  CBG: No results for input(s): GLUCAP in the last 168 hours.  Radiological Exams on Admission: DG Chest 2 View  Result Date: 09/01/2020 CLINICAL DATA:  Cough with weakness 1 week. EXAM: CHEST - 2 VIEW COMPARISON:  08/30/2020 and 06/05/2020 as well as CT chest 08/30/2020 FINDINGS: Lungs are adequately inflated with slight worsening hazy density over the left base/retrocardiac region with blunting of the left costophrenic angle as findings may be due to effusion/atelectasis versus infection. Subtle hazy prominence of the perihilar vessels unchanged. Stable cardiomegaly. Remainder of the exam is unchanged. IMPRESSION: 1. Slight worsening hazy density over the left base/retrocardiac region which may be due to effusion/atelectasis versus infection. 2. Stable cardiomegaly with suggestion of minimal vascular congestion. Electronically Signed   By: Elberta Fortis M.D.   On: 09/01/2020 12:31   DG Chest 2 View  Result Date: 08/30/2020 CLINICAL DATA:  Cough and weakness EXAM: CHEST - 2 VIEW COMPARISON:  June 05, 2020 FINDINGS: There is airspace opacity in portions of each mid lung and left lower lobe with left pleural effusion. There is cardiomegaly with pulmonary vascularity within normal limits. No adenopathy. No bone lesions. IMPRESSION: Airspace opacity consistent with pneumonia in the left lower lung region as well as in each mid lung region. Check of COVID-19 status may be prudent in this circumstance. Left pleural effusion noted.  Stable cardiomegaly. Electronically Signed   By: Bretta Bang III M.D.   On:  08/30/2020 18:02   CT Chest Wo Contrast  Result Date: 08/30/2020 CLINICAL DATA:  Pneumonia, effusion or abscess suspected, xray done Generalized weakness for 2 weeks. Radiologic records demonstrates history of lung cancer. EXAM: CT CHEST WITHOUT CONTRAST TECHNIQUE: Multidetector CT imaging of the chest was performed following the standard protocol without IV contrast. COMPARISON:  Radiograph earlier today. Staging chest CT 8 days ago 08/21/2020 FINDINGS: Cardiovascular: Aortic atherosclerosis and tortuosity. Focal outpouching of the left lateral wall of thoracic aorta on prior exam is grossly stable, but not well assessed on the current exam in the absence of IV contrast. No periaortic stranding. Multi chamber cardiomegaly. Coronary artery calcifications. Small pericardial effusion is similar Mediastinum/Nodes: Patient had recent staging CT with IV contrast the. Right paratracheal lymph node measuring right lower paratracheal node measuring 2.3 cm, series 2, image 50, unchanged allowing for differences in technique and caliper placement. Subcarinal node measures 2.1 cm, series 2, image 71, previously 1.9 cm. Additional smaller lymph nodes are unchanged from prior exam. Hilar adenopathy is not well assessed on this noncontrast exam. Left epicardial node measures 1.2 cm, series 2, image 139, previously 1.1 cm. No obvious no adenopathy in the interim. Decompressed esophagus. Lungs/Pleura: Partially loculated left pleural effusion at the lung base this is not significantly changed in size. There is left lower lobe bronchial thickening. No definite acute airspace disease. There is mild scarring abutting the inter lobar fissure on the left which appears similar to prior exam. Spiculated left upper lobe pulmonary nodule measures 2.5 x 2 cm, series 3, image 27, unchanged. Spiculations extend to the pleural surface anterior laterally. Background emphysema. Patchy ground-glass opacity in the perifissural right upper lobe,  series 3, image 66, with adjacent fissural thickening, progressed/new. Additional a ground-glass opacity in the perihilar  right lower lobe. Slight basilar septal thickening. There is a trace right pleural effusion and seen on prior exam. Upper Abdomen: Bilateral renal parenchymal atrophy. Diverticulosis of the included colon. No acute upper abdominal findings. Musculoskeletal: There are no acute or suspicious osseous abnormalities. IMPRESSION: 1. Patchy ground-glass opacity in the perifissural right upper and to a lesser extent right lower lobe. Differential considerations include pneumonitis including COVID-19 versus pulmonary edema. Small right pleural effusion is new from CT 1 week ago. 2. Unchanged partially loculated left pleural effusion at the lung base. 3. Unchanged spiculated left upper lobe pulmonary nodule. Unchanged mediastinal adenopathy. Patient recently underwent staging CT. 4. Cardiomegaly with coronary artery calcifications. Small pericardial effusion is unchanged. Aortic Atherosclerosis (ICD10-I70.0) and Emphysema (ICD10-J43.9). Electronically Signed   By: Keith Rake M.D.   On: 08/30/2020 20:01    EKG: Independently reviewed.  Sinus with first-degree AV block versus junctional rhythm, left bundle branch block, no Sgarbossa criteria met.  Unchanged from prior.  Assessment/Plan Active Problems:   Type II diabetes mellitus with renal manifestations ([facility])   ESRD on dialysis ([place])   Gangrene of toe of right foot ([facility])   Atherosclerosis of native arteries of the extremities with gangrene ([facility])   HLD (hyperlipidemia)   Stroke ([facility])   Depression   Tobacco abuse   Chronic diastolic CHF (congestive heart failure) ([facility])   Primary adenocarcinoma of left lung ([facility])   Malignant pleural effusion   Physical deconditioning   Jose SKOWRONEK is a 75 y.o. male with history of ESRD on dialysis, type 2 diabetes, significant PVD, CVA, hyperlipidemia, hypertension, chronic diastolic CHF, and  adenocarcinoma of the left lung currently undergoing palliative chemotherapy, who presents with physical deconditioning and likely volume overload.  #Physical Deconditioning Multifactorial in the setting of advanced metastatic malignancy, chemotherapy, and poor baseline functional status.  Patient reports that he is resistant to the idea of rehab but he is open to PT OT evaluation. -PT eval and treatment -OT eval and treatment -Nutrition consult -Transitions of care consult  #ESRD #Volume overload Patient with BNP above assay and progressive fatigue, I spoke with renal and there is some suspicion on both our parts that he may be volume overloaded.  Renal following and will plan to dialyze and remove fluid. -Appreciate renal consultation -Continue PhosLo, torsemide  #Dysphonia Patient reports that his voice is hoarse and is not usually like this.  Physical exam without any notable palpable cervical lymphadenopathy.  Review of prior imaging shows presence of a 7 mm left jugular lymph node that may be implicated.  #Known medical problems Gout-allopurinol not reordered given no documentation of any recent episodes and ESRD Hypertension-continue amlodipine CAD-continue aspirin, atorvastatin, clopidogrel Depression-continue escitalopram Neuropathy-continue gabapentin at reduced dose from 300 to 100 mg 3 times daily Hypothyroidism-continue Synthroid BPH-continue Flomax, patient still produces small amount of urine DM2-only on oral hypoglycemic at home, placed on 4 times daily checks with very sensitive sliding scale insulin correction  Code Status: Full Code DVT Prophylaxis: Lovenox Family Communication: None Disposition Plan: Inpatient, Med-Surg   Time spent: 73 min  Clarnce Flock MD/[facility]  Note:  This document was prepared using Systems analyst and may include unintentional dictation errors.

## 2020-09-01 NOTE — ED Notes (Signed)
Hospitalist and Nephro at bedside. Pt to be dialyzed today. Dialysis aware.

## 2020-09-01 NOTE — Progress Notes (Signed)
ON PATHWAY REGIMEN - Non-Small Cell Lung  No Change  Continue With Treatment as Ordered.  Original Decision Date/Time: 12/23/2019 16:01     A cycle is 21 days:     Pembrolizumab   **Always confirm dose/schedule in your pharmacy ordering system**  Patient Characteristics: Stage IV Metastatic, Nonsquamous, Initial Chemotherapy/Immunotherapy, PS = 0, 1, ALK Rearrangement Negative and ROS1 Rearrangement Negative and NTRK Gene Fusion?Negative and RET Gene Fusion?Negative and EGFR Mutation Negative/Non?Sensitizing, PD-L1  Expression Positive  ? 50% (TPS) and Immunotherapy Candidate Therapeutic Status: Stage IV Metastatic Histology: Nonsquamous Cell ROS1 Rearrangement Status: Negative Other Mutations/Biomarkers: No Other Actionable Mutations NTRK Gene Fusion Status: Negative PD-L1 Expression Status: PD-L1 Positive ? 50% (TPS) Chemotherapy/Immunotherapy LOT: Initial Chemotherapy/Immunotherapy Molecular Targeted Therapy: Not Appropriate MET Exon 14 Mutation Status: Negative RET Gene Fusion Status: Negative ALK Rearrangement Status: Negative EGFR Mutation Status: Negative/Wild Type BRAF [id number] Mutation Status: Negative ECOG Performance Status: 1 Biomarker Assessment Status Confirmation: All Genomic Markers Negative or Only MET+ or BRAF+ Immunotherapy Candidate Status: Candidate for Immunotherapy Intent of Therapy: Non-Curative / Palliative Intent, Discussed with Patient

## 2020-09-01 NOTE — ED Notes (Signed)
Pt does still make urine but does not need to go right now. Urinal left at bedside.

## 2020-09-01 NOTE — ED Notes (Addendum)
Report given to [facility] RN over the phone at this time. Dialysis RN notified at this time, pt is good to send to 1C after dialysis.

## 2020-09-02 DIAGNOSIS — E039 Hypothyroidism, unspecified: Secondary | ICD-10-CM

## 2020-09-02 DIAGNOSIS — E785 Hyperlipidemia, unspecified: Secondary | ICD-10-CM

## 2020-09-02 DIAGNOSIS — R531 Weakness: Secondary | ICD-10-CM

## 2020-09-02 DIAGNOSIS — E1169 Type 2 diabetes mellitus with other specified complication: Secondary | ICD-10-CM

## 2020-09-02 DIAGNOSIS — E8770 Fluid overload, unspecified: Principal | ICD-10-CM

## 2020-09-02 DIAGNOSIS — F32A Depression, unspecified: Secondary | ICD-10-CM

## 2020-09-02 DIAGNOSIS — F419 Anxiety disorder, unspecified: Secondary | ICD-10-CM

## 2020-09-02 LAB — HEMOGLOBIN A1C
Hgb A1c MFr Bld: 6.3 % — ABNORMAL HIGH (ref 4.8–5.6)
Mean Plasma Glucose: 134.11 mg/dL

## 2020-09-02 LAB — GLUCOSE, CAPILLARY
Glucose-Capillary: 102 mg/dL — ABNORMAL HIGH (ref 70–99)
Glucose-Capillary: 210 mg/dL — ABNORMAL HIGH (ref 70–99)
Glucose-Capillary: 130 mg/dL — ABNORMAL HIGH (ref 70–99)
Glucose-Capillary: 120 mg/dL — ABNORMAL HIGH (ref 70–99)

## 2020-09-02 LAB — MRSA PCR SCREENING: MRSA by PCR: NEGATIVE

## 2020-09-02 LAB — HEPATITIS B SURFACE ANTIGEN: Hepatitis B Surface Ag: NONREACTIVE

## 2020-09-02 MED ORDER — DOXYCYCLINE HYCLATE 100 MG PO TABS
100.0000 mg | ORAL_TABLET | Freq: Two times a day (BID) | ORAL | Status: DC
  Administered 2020-09-02 – 2020-09-03 (×4): 100 mg via ORAL
  Filled 2020-09-02 (×3): qty 1

## 2020-09-02 MED ORDER — HEPARIN SODIUM (PORCINE) 5000 UNIT/ML IJ SOLN
5000.0000 [IU] | Freq: Three times a day (TID) | INTRAMUSCULAR | Status: DC
  Administered 2020-09-02 – 2020-09-04 (×6): 5000 [IU] via SUBCUTANEOUS
  Filled 2020-09-02 (×6): qty 1

## 2020-09-02 NOTE — Progress Notes (Signed)
CSW spoke with patients sister and asked if she had a different number for Jose [name] and she stated no. Patients sister, [name] stated that she has no issues with patient returning to her home after rehab.

## 2020-09-02 NOTE — Evaluation (Signed)
Occupational Therapy Evaluation Patient Details Name: Jose [name] MRN: [id number] DOB: May 08, 1946 Today's Date: 09/02/2020    History of Present Illness Pt is a 75yo M admitted to [facility] on 09/01/20 for c/o progressive weakness, SOB, slight cough, and fatigue over the past several weeks. Pt with recent ED admit on 3/31 for similar complaints, but was DC. PMH significant for: T2DM, ESRD (on HD), HLD, CVA, depression, gangrene of toe of R foot, chronic diastolic CHF, and lung cancer. Imaging revealed: effusion/atelectasis vs. infection and stable cardiomegaly with minimal vascular congestion.   Clinical Impression   Pt seen for OT evaluation this date in setting of acute hospitalization d/t pleural effusion. Pt presents this date generally weak and with low energy/acvtivity tolerance. Pt reports that at baseline, he walks with a walker and states he is able to perform his self care with MOD I ("bird baths" for bathing). States his sister that he lives with performs [facility] IADLs. This date, pt requires CGA/MIN A with ADL transfers with RW, MIN A for LB ADLs in sitting and MOD A for LB ADLs in standing such as posterior peri care. OT educates pt re: safety considerations/fall prevention considerations with standing ADLs given pt with decreased tolerance at present time/on assessment. Pt with moderate reception. Pt returned to chair with CGA with RW and left with all needs met and in reach and chair alarm activated. Will continue to follow acutely. Anticipate that pt will require STR to f/u upon d/c from acute setting as pt demos decreased fxl activity tolerance and decreased INDEP with self care ADLs versus his reported fxl baseline.    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 He stands from elevated BSC with only CGA, but required MIN A from lower surfaces such as recliner.    Balance Overall balance assessment: Needs assistance Sitting-balance support: No upper extremity supported;Feet supported Sitting balance-Leahy Scale: Good Sitting balance - Comments: Good seated balance at EOB; able to lean forward to assist with donning shoes without LOB.   Standing balance support: Bilateral upper extremity supported;During functional activity Standing balance-Leahy Scale: Poor Standing balance comment: Poor standing balance in RW, requiring at least CGA for safety, cannot accept challenge.                           ADL either performed or assessed with clinical judgement   ADL  General ADL Comments: requires SETUP for seated UB ADLs, CGA for standing UB ADLs with RW For standing balance/support. MIN A for seated LB ADLs including threading socks and MOD A for standing peri care/posterior LB bathing with RW for support d/t more dynamic nature of standing LB ADLs. Pt with episode of bowel incontinence either before or during session which is discovered when assisting pt with toileting skills and RN notified.     Vision Patient Visual  Report: No change from baseline       Perception     Praxis      Pertinent Vitals/Pain Pain Assessment: No/denies pain     Hand Dominance Right   Extremity/Trunk Assessment Upper Extremity Assessment Upper Extremity Assessment: Generalized weakness;Overall WFL for tasks assessed (ROM grossly functional, but MMT grossly 3+ to 4-/5)   Lower Extremity Assessment Lower Extremity Assessment: Generalized weakness;Overall [facility] for tasks assessed (ROm is functional, but MMT grossly 4-/5. Reports slightly less sensation to R foot than L but attributes this to swelling and R is noted to be slightly more swollen. Pt with weak pedal pulses per nursing and has h/o toe amps bilaterally.)   Cervical / Trunk Assessment Cervical / Trunk Assessment: Normal   Communication Communication Communication: Other (comment) (intermittently garbled/difficult to understand speech)   Cognition Arousal/Alertness: Lethargic Behavior During Therapy: WFL for tasks assessed/performed Overall Cognitive Status: Within Functional Limits for tasks assessed                                 General Comments: A&O x3, needed max cues for month and year. Pt intermittently fatigued/closing eyes throughout session, but able to follow all simple one step commands appropriately and rouses easily to stimuli.   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 +1 non-pitting edema in bil feet/ankles.    Exercises Other Exercises Other Exercises: OT educates pt re: role of OT, importance of activity, notifying nursing staff if he needs to use restroom to prevent skin breakdown. Pt with moderate understanding initially, only MIN/MOD carryover, so will require f/u. Other Exercises: Pt educated regarding: PT role/POC, DC recommendations, benefits of sitting in recliner/OOB, and safety with mobility.   Shoulder Instructions      Home Living Family/patient expects to be discharged to:: Private  residence Living Arrangements: Other relatives (sister who is 33 and still working apparently) Available Help at Discharge: Family;Available PRN/intermittently Type of Home: House Home Access: Stairs to enter [employer] of Steps: 3 Entrance Stairs-Rails: Can reach both Home Layout: One level     Bathroom Shower/Tub: Teacher, early years/pre: Standard     Home Equipment: Environmental consultant - 2 wheels;Cane - single point;Wheelchair - manual          Prior Functioning/Environment Level of Independence: Needs assistance  Gait / Transfers Assistance Needed: Uses RW for limited household ambulation, SPC for HD, and WC for community ambulation ADL's / Homemaking Assistance Needed: Pt states he's Ind with bird baths but has trouble washing shoulders; otherwise is Ind with dressing and toileting. Per chart review from last hospitalization (10 mo ago), nephew assists with bathing and other ADL's. Sister assists with IADL's.            OT Problem List: Decreased strength;Decreased activity tolerance;Impaired balance (sitting and/or standing);Decreased safety awareness;Decreased knowledge of use of DME or AE;Cardiopulmonary status limiting activity;Impaired sensation;Increased edema      OT Treatment/Interventions: Self-care/ADL training;DME and/or  AE instruction;Therapeutic activities;Balance training;Therapeutic exercise;Patient/family education    OT Goals(Current goals can be found in the care plan section) Acute Rehab OT Goals Patient Stated Goal: to walk by myself OT Goal Formulation: With patient Time For Goal Achievement: 09/16/20 Potential to Achieve Goals: Good ADL Goals Pt Will Perform Grooming: with supervision;standing (with RW sink-side to complete 2-3 g/h tasks to improve fxl activity tolernace.) Pt Will Perform Lower Body Dressing: with set-up;with supervision;sit to/from stand (with LRAD for standing balance with clothing mgt over hips and AE PRN for threading  socks/shoes.) Pt Will Transfer to Toilet: ambulating;with supervision;grab bars (with RW to/from restroom with only 1 standing rest break (PRN) with elevated commode PRN) Pt Will Perform Toileting - Clothing Manipulation and hygiene: with supervision;with min guard assist;sit to/from stand (wiht grab bars/toilet rails/RW for UE support) Pt/caregiver will Perform Home Exercise Program: Increased strength;Both right and left upper extremity;With minimal assist  OT Frequency: Min 1X/week   Barriers to D/C: Decreased caregiver support  sister that he lives with is 73 and pt states that she is working. Chart review reveals that his nephew apparently helps some as well?       Co-evaluation              AM-PAC OT "6 Clicks" Daily Activity     Outcome Measure Help from another person eating meals?: None Help from another person taking care of personal grooming?: A Little Help from another person toileting, which includes using toliet, bedpan, or urinal?: A Lot Help from another person bathing (including washing, rinsing, drying)?: A Lot Help from another person to put on and taking off regular upper body clothing?: A Little Help from another person to put on and taking off regular lower body clothing?: A Lot 6 Click Score: 16   End of Session Equipment Utilized During Treatment: Gait belt;Rolling walker Nurse Communication: Mobility status  Activity Tolerance: Patient tolerated treatment well Patient left: in chair;with call bell/phone within reach;with chair alarm set  OT Visit Diagnosis: Unsteadiness on feet (R26.81);Muscle weakness (generalized) (M62.81)                Time: 2197-5883 OT Time Calculation (min): 45 min Charges:  OT General Charges $OT Visit: 1 Visit OT Evaluation $OT Eval Moderate Complexity: 1 Mod OT Treatments $Self Care/Home Management : 23-37 mins $Therapeutic Activity: 8-22 mins  Gerrianne Scale, MS, OTR/L ascom [phone] 09/02/20, 2:16 PM

## 2020-09-02 NOTE — NC FL2 (Signed)
[place] LEVEL OF CARE SCREENING TOOL     IDENTIFICATION  Patient Name: Jose [name] Birthdate: 1945/12/01 Sex: male Admission Date (Current Location): 09/01/2020  [place] and [place] Number:  Engineering geologist and Address:  [facility], [address] [place], [place] [address]      Provider Number: [id number]  Attending Physician Name and Address:  Loletha Grayer, MD  Relative Name and Phone Number:  [name], [phone]    Current Level of Care: SNF Recommended Level of Care: [place] Prior Approval Number:    Date Approved/Denied:   PASRR Number: [id number]  Discharge Plan: SNF    Current Diagnoses: Patient Active Problem List   Diagnosis Date Noted  . Physical deconditioning 09/01/2020  . Weight loss 07/11/2020  . Encounter for antineoplastic immunotherapy 02/15/2020  . Goals of care, counseling/discussion 12/23/2019  . 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 Malignant neoplasm of upper lobe of left lung ([place]) 12/23/2019  . Thickening of wall of gallbladder   . Primary adenocarcinoma of left lung ([place])   . Hypokalemia 10/30/2019  . Chronic diastolic CHF (congestive heart failure) ([place]) 10/30/2019  . Benign essential HTN 10/30/2019  . Malignant neoplasm of unknown origin ([place]) 10/30/2019  . Acute respiratory failure with hypoxia ([place]) 10/30/2019  . End-stage renal disease on hemodialysis ([place]) 10/30/2019  . Diabetes mellitus type 2, controlled, with complications ([place]) 87/86/7672  . SOB (shortness of breath) 10/29/2019  . Pulmonary edema 10/22/2019  . Diarrhea 10/22/2019  . Acute on chronic diastolic CHF (congestive heart failure) ([place]) 10/22/2019  . HLD (hyperlipidemia)   . Stroke ([place])   . Depression   . Tobacco abuse   . Pleural effusion on left   . Acute urinary retention   . Foot ulcer ([place]) 12/15/2018  . Ischemic foot 10/11/2018  . Atherosclerosis of native arteries of the  extremities with gangrene ([place]) 07/09/2018  . Gangrene of toe of right foot ([place]) 06/13/2018  . Type II diabetes mellitus with renal manifestations ([place]) 04/16/2018  . Hypertension 04/16/2018  . ESRD on dialysis ([place]) 04/16/2018  . Malnutrition of moderate degree 03/27/2018  . Renal failure 03/21/2018    Orientation RESPIRATION BLADDER Height & Weight     Self,Time,Situation,Place  Normal Incontinent Weight: 137 lb 5.6 oz (62.3 kg) Height:  6\' 1"  (185.4 cm)  BEHAVIORAL SYMPTOMS/MOOD NEUROLOGICAL BOWEL NUTRITION STATUS      Incontinent    AMBULATORY STATUS COMMUNICATION OF NEEDS Skin   Extensive Assist Verbally Normal                       Personal Care Assistance Level of Assistance  Feeding,Bathing,Dressing Bathing Assistance: Limited assistance Feeding assistance: Independent Dressing Assistance: Limited assistance     Functional Limitations Info  Sight,Hearing,Speech Sight Info: Adequate Hearing Info: Adequate Speech Info: Adequate    SPECIAL CARE FACTORS FREQUENCY                       Contractures Contractures Info: Not present    Additional Factors Info  Code Status Code Status Info: Full             Current Medications (09/02/2020):  This is the current hospital active medication list Current Facility-Administered Medications  Medication Dose Route Frequency Provider Last Rate Last Admin  . acetaminophen (TYLENOL) tablet 650 mg  650 mg Oral Q6H PRN Clarnce Flock, MD      . amLODipine (NORVASC) tablet  10 mg  10 mg Oral Daily Clarnce Flock, MD   10 mg at 09/02/20 0539  . aspirin EC tablet 81 mg  81 mg Oral Daily Clarnce Flock, MD   81 mg at 09/02/20 7673  . atorvastatin (LIPITOR) tablet 10 mg  10 mg Oral QHS Clarnce Flock, MD   10 mg at 09/01/20 2159  . calcium acetate (PHOSLO) capsule 1,334 mg  1,334 mg Oral TID WC Clarnce Flock, MD   1,334 mg at 09/02/20 [phone]  . Chlorhexidine Gluconate Cloth 2 % PADS 6 each  6 each  Topical Q0600 Clarnce Flock, MD      . clopidogrel (PLAVIX) tablet 75 mg  75 mg Oral Daily Clarnce Flock, MD   75 mg at 09/02/20 0947  . escitalopram (LEXAPRO) tablet 10 mg  10 mg Oral Daily Clarnce Flock, MD   10 mg at 09/02/20 0948  . feeding supplement (NEPRO CARB STEADY) liquid 237 mL  237 mL Oral BID BM Clarnce Flock, MD   237 mL at 09/02/20 0955  . gabapentin (NEURONTIN) capsule 100 mg  100 mg Oral TID Clarnce Flock, MD   100 mg at 09/02/20 7902  . heparin injection 5,000 Units  5,000 Units Subcutaneous Q8H W[place], Richard, MD      . insulin aspart (novoLOG) injection 0-6 Units  0-6 Units Subcutaneous TID WC Clarnce Flock, MD      . levothyroxine (SYNTHROID) tablet 50 mcg  50 mcg Oral QAC breakfast Clarnce Flock, MD   50 mcg at 09/02/20 0556  . methocarbamol (ROBAXIN) 500 mg in dextrose 5 % 50 mL IVPB  500 mg Intravenous Q6H PRN Clarnce Flock, MD      . multivitamin with minerals tablet 1 tablet  1 tablet Oral Daily Clarnce Flock, MD   1 tablet at 09/02/20 [phone]  . ondansetron (ZOFRAN) tablet 4 mg  4 mg Oral Q6H PRN Clarnce Flock, MD       Or  . ondansetron [facility]) injection 4 mg  4 mg Intravenous Q6H PRN Clarnce Flock, MD      . oxyCODONE (Oxy IR/ROXICODONE) immediate release tablet 5 mg  5 mg Oral Q4H PRN Clarnce Flock, MD      . polyethylene glycol (MIRALAX / GLYCOLAX) packet 17 g  17 g Oral Daily PRN Clarnce Flock, MD      . tamsulosin [facility]) capsule 0.4 mg  0.4 mg Oral Daily Clarnce Flock, MD   0.4 mg at 09/02/20 0948  . torsemide (DEMADEX) tablet 40 mg  40 mg Oral Daily Clarnce Flock, MD   40 mg at 09/02/20 0948  . traZODone (DESYREL) tablet 25 mg  25 mg Oral QHS PRN Clarnce Flock, MD         Discharge Medications: Please see discharge summary for a list of discharge medications.  Relevant Imaging Results:  Relevant Lab Results:   Additional Information ss [place],  LCSWA

## 2020-09-02 NOTE — Progress Notes (Signed)
CSW contacted patients son, [name] but was unsuccessful. Unable to leave a VM

## 2020-09-02 NOTE — Progress Notes (Signed)
Patient ID: Jose [name], male   DOB: [birth year], 75 y.o.   MRN: [id number] [facility] PROGRESS NOTE  Jose [name] QVZ:[id number] DOB: 07/20/1945 DOA: 09/01/2020 PCP: [name], MD  HPI/Subjective: Patient feels a little bit better today with regards to his breathing.  Has some cough but not as much.  Still feeling very weak.  Admitted yesterday with weakness and fluid overload in a dialysis patient.  Objective: Vitals:   09/02/20 1032 09/02/20 1127  BP:  126/77  Pulse:  77  Resp:  20  Temp:  97.6 F (36.4 C)  SpO2: 97% 97%    Intake/Output Summary (Last 24 hours) at 09/02/2020 1239 Last data filed at 09/02/2020 1027 Gross per 24 hour  Intake 120 ml  Output 1500 ml  Net -1380 ml   Filed Weights   09/01/20 2000  Weight: 62.3 kg    ROS: Review of Systems  Respiratory: Positive for cough and shortness of breath.   Cardiovascular: Negative for chest pain.  Gastrointestinal: Negative for abdominal pain, nausea and vomiting.   Exam: Physical Exam HENT:     Head: Normocephalic.     Mouth/Throat:     Pharynx: No oropharyngeal exudate.  Eyes:     General: Lids are normal.     Conjunctiva/sclera: Conjunctivae normal.     Pupils: Pupils are equal, round, and reactive to light.  Cardiovascular:     Rate and Rhythm: Normal rate and regular rhythm.     Heart sounds: Normal heart sounds, S1 normal and S2 normal.  Pulmonary:     Breath sounds: Examination of the right-lower field reveals decreased breath sounds. Examination of the left-lower field reveals decreased breath sounds. Decreased breath sounds present. No wheezing, rhonchi or rales.  Abdominal:     Palpations: Abdomen is soft.     Tenderness: There is no abdominal tenderness.  Musculoskeletal:     Right ankle: No swelling.     Left ankle: No swelling.  Skin:    General: Skin is warm.     Findings: No rash.  Neurological:     Mental Status: He is alert and oriented to person, place, and time.        Data Reviewed: Basic Metabolic Panel: Recent Labs  Lab 08/30/20 1827 09/01/20 1049 09/01/20 2026  NA 136 134*  --   K 3.6 3.6  --   CL 94* 93*  --   CO2 29 27  --   GLUCOSE 89 123*  --   BUN 20 36*  --   CREATININE 5.32* 7.73*  --   CALCIUM 9.0 9.6  --   PHOS  --   --  3.3   Liver Function Tests: Recent Labs  Lab 08/30/20 1827  AST 19  ALT 13  ALKPHOS 36*  BILITOT 1.2  PROT 6.9  ALBUMIN 3.5   CBC: Recent Labs  Lab 08/30/20 1827 09/01/20 1049  WBC 8.9 8.1  NEUTROABS 6.8  --   HGB 13.8 14.3  HCT 41.3 43.0  MCV 96.5 98.2  PLT 138* 133*   BNP (last 3 results) Recent Labs    10/29/19 2021 08/30/20 1749 09/01/20 1049  BNP 122.5* >4,500.0* >4,500.0*    CBG: Recent Labs  Lab 09/01/20 2103 09/02/20 0738 09/02/20 1202  GLUCAP 131* 102* 210*    Recent Results (from the past 240 hour(s))  Resp Panel by RT-PCR (Flu A&B, Covid) Nasopharyngeal Swab     Status: None   Collection Time: 09/01/20  1:25 PM  Specimen: Nasopharyngeal Swab; Nasopharyngeal(NP) swabs in vial transport medium  Result Value Ref Range Status   SARS Coronavirus 2 by RT PCR NEGATIVE NEGATIVE Final    Comment: (NOTE) SARS-CoV-2 target nucleic acids are NOT DETECTED.  The SARS-CoV-2 RNA is generally detectable in upper respiratory specimens during the acute phase of infection. The lowest concentration of SARS-CoV-2 viral copies this assay can detect is 138 copies/mL. A negative result does not preclude SARS-Cov-2 infection and should not be used as the sole basis for treatment or other patient management decisions. A negative result may occur with  improper specimen collection/handling, submission of specimen other than nasopharyngeal swab, presence of viral mutation(s) within the areas targeted by this assay, and inadequate number of viral copies(<138 copies/mL). A negative result must be combined with clinical observations, patient history, and epidemiological information.  The expected result is Negative.  Fact Sheet for Patients:  EntrepreneurPulse.com.au  Fact Sheet for Healthcare Providers:  IncredibleEmployment.be  This test is no t yet approved or cleared by the [place] FDA and  has been authorized for detection and/or diagnosis of SARS-CoV-2 by FDA under an Emergency Use Authorization (EUA). This EUA will remain  in effect (meaning this test can be used) for the duration of the COVID-19 declaration under Section 564(b)(1) of the Act, 21 U.S.C.section 360bbb-3(b)(1), unless the authorization is terminated  or revoked sooner.       Influenza A by PCR NEGATIVE NEGATIVE Final   Influenza B by PCR NEGATIVE NEGATIVE Final    Comment: (NOTE) The Xpert Xpress SARS-CoV-2/FLU/RSV plus assay is intended as an aid in the diagnosis of influenza from Nasopharyngeal swab specimens and should not be used as a sole basis for treatment. Nasal washings and aspirates are unacceptable for Xpert Xpress SARS-CoV-2/FLU/RSV testing.  Fact Sheet for Patients: EntrepreneurPulse.com.au  Fact Sheet for Healthcare Providers: IncredibleEmployment.be  This test is not yet approved or cleared by the [place] FDA and has been authorized for detection and/or diagnosis of SARS-CoV-2 by FDA under an Emergency Use Authorization (EUA). This EUA will remain in effect (meaning this test can be used) for the duration of the COVID-19 declaration under Section 564(b)(1) of the Act, 21 U.S.C. section 360bbb-3(b)(1), unless the authorization is terminated or revoked.  Performed at [facility], [address] [place], [place] [address]   MRSA PCR Screening     Status: None   Collection Time: 09/02/20  6:03 AM   Specimen: Nasopharyngeal  Result Value Ref Range Status   MRSA by PCR NEGATIVE NEGATIVE Final    Comment:        The GeneXpert MRSA Assay (FDA approved for NASAL  specimens only), is one component of a comprehensive MRSA colonization surveillance program. It is not intended to diagnose MRSA infection nor to guide or monitor treatment for MRSA infections. Performed at [facility], [address] [place],  [address]      Studies: DG Chest 2 View  Result Date: 09/01/2020 CLINICAL DATA:  Cough with weakness 1 week. EXAM: CHEST - 2 VIEW COMPARISON:  08/30/2020 and 06/05/2020 as well as CT chest 08/30/2020 FINDINGS: Lungs are adequately inflated with slight worsening hazy density over the left base/retrocardiac region with blunting of the left costophrenic angle as findings may be due to effusion/atelectasis versus infection. Subtle hazy prominence of the perihilar vessels unchanged. Stable cardiomegaly. Remainder of the exam is unchanged. IMPRESSION: 1. Slight worsening hazy density over the left base/retrocardiac region which may be due to effusion/atelectasis versus infection.  2. Stable cardiomegaly with suggestion of minimal vascular congestion. Electronically Signed   By: Marin Olp M.D.   On: 09/01/2020 12:31    Scheduled Meds: . amLODipine  10 mg Oral Daily  . aspirin EC  81 mg Oral Daily  . atorvastatin  10 mg Oral QHS  . calcium acetate  1,334 mg Oral TID WC  . Chlorhexidine Gluconate Cloth  6 each Topical Q0600  . clopidogrel  75 mg Oral Daily  . doxycycline  100 mg Oral Q12H  . escitalopram  10 mg Oral Daily  . feeding supplement (NEPRO CARB STEADY)  237 mL Oral BID BM  . gabapentin  100 mg Oral TID  . heparin  5,000 Units Subcutaneous Q8H  . insulin aspart  0-6 Units Subcutaneous TID WC  . levothyroxine  50 mcg Oral QAC breakfast  . multivitamin with minerals  1 tablet Oral Daily  . tamsulosin  0.4 mg Oral Daily  . torsemide  40 mg Oral Daily   Continuous Infusions: . methocarbamol (ROBAXIN) IV      Assessment/Plan:  1. Fluid overload in a dialysis patient.  Had dialysis yesterday and breathing better.   Continue dialysis as is way of removing fluid. 2. Generalized weakness.  Physical therapy and occupational therapy recommended rehab. 3. Questionable pneumonia, history of left pleural catheter.  Start doxycycline orally. 4. 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  Stage IV lung cancer.  Patient has poor appetite as per sister.  Continue nephro drinks.  We will get palliative care consultation. 5. Hypothyroidism unspecified on levothyroxine 6. Anxiety depression on Lexapro 7. History of CAD on aspirin, atorvastatin and Plavix 8. Essential hypertension on amlodipine 9. Neuropathy on low-dose gabapentin 10. BPH on Flomax 11. Type 2 diabetes mellitus with hyperlipidemia.  On atorvastatin.  With hemoglobin A1c being low at 6.3 [facility] watch off medications at this time and see how things progress with his fingersticks.        Code Status:     Code Status Orders  (From admission, onward)         Start     Ordered   09/01/20 1952  Full code  Continuous        09/01/20 1951        Code Status History    Date Active Date Inactive Code Status Order ID Comments User Context   10/30/2019 0128 11/05/2019 2211 Full Code [id number]  Elwyn Reach, MD Inpatient   10/22/2019 2304 10/24/2019 2140 Full Code [id number]  Ivor Costa, MD Inpatient   12/15/2018 1639 12/21/2018 2155 Full Code [id number]  Saundra Shelling, MD Inpatient   10/11/2018 2235 10/16/2018 1851 Full Code [id number]  Vaughan Basta, MD ED   10/07/2018 1042 10/07/2018 1733 Full Code [id number]  Algernon Huxley, MD Inpatient   09/30/2018 0956 09/30/2018 1441 Full Code [id number]  Algernon Huxley, MD Inpatient   08/30/2018 1224 08/30/2018 2034 Full Code [id number]  Algernon Huxley, MD Inpatient   07/23/2018 1111 07/23/2018 1517 Full Code [id number]  Samara Deist, [facility] Inpatient   06/13/2018 0135 06/17/2018 2206 Full Code [id number]  Lance Coon, MD ED   03/21/2018 1315 03/28/2018 1004 Full Code [id number]  Saundra Shelling, MD  ED   Advance Care Planning Activity     Family Communication: Unable to reach son on the phone.  Spoke with sister on the phone. Disposition Plan: Status is: Inpatient  Dispo: The patient is from: Home  Anticipated d/c is to: Rehab              Patient currently with fatigue and weakness and physical therapy recommending rehab.  Also started on antibiotics with possibility of pneumonia.  Dialyzed yesterday for fluid overload.   Difficult to place patient.  Hopefully not.  Time spent: 28 minutes  [place]

## 2020-09-02 NOTE — Evaluation (Addendum)
Physical Therapy Evaluation Patient Details Name: Jose [name] MRN: [id number] DOB: May 22, 1946 Today's Date: 09/02/2020   History of Present Illness  Pt is a 75yo M admitted to [facility] on 09/01/20 for c/o progressive weakness, SOB, slight cough, and fatigue over the past several weeks. Pt with recent ED admit on 3/31 for similar complaints, but was DC. PMH significant for: T2DM, ESRD (on HD), HLD, CVA, depression, gangrene of toe of R foot, chronic diastolic CHF, and lung cancer. Imaging revealed: effusion/atelectasis vs. infection and stable cardiomegaly with minimal vascular congestion.    Clinical Impression  Pt is a 75 year old M admitted to hospital on 09/01/20 for progressive weakness and fatigue. At baseline, pt is mod I with ADL's, uses RW for HH amb, SPC for HD, and WC for community ambulation; sister assists with IADL's. Conflicting information from chart review stating pt needs assist/supervision for ADL's. Pt presents with functional weakness, decreased activity tolerance, bil foot/ankle +1 non-pitting edema, decreased standing balance, and poor safety awareness. Due to deficits, pt required supervision for bed mobility, CGA for transfers, and CGA for short distance ambulation with RW. Gait deviations, poor standing balance, and poor safety awareness increase the pt's risk falls. Decreased activity tolerance noted by increased seated rest breaks and RPE of 7-8/10 indicating "vigorous activity" with minimal mobility. Deficits limit the pt's ability to safely and independently perform ADL's, transfer, and ambulate. Pt will benefit from acute skilled PT services to address deficits for return to baseline function. At this time, PT recommends SNF at DC to address deficits and improve overall safety with functional mobility, prior to return home. Pt agreeable.     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 Increased time/effort, requiring seated rest break after transfer.    Transfers Overall transfer level: Needs assistance Equipment used: Rolling walker (2 wheeled) Transfers: Sit to/from Stand Sit to Stand: Min guard         General transfer comment: CGA for safety to stand from EOB with RW. Multimodal cues for sequencing, safety, and hand placement. Increased time to achieve full standing, with minimal posterior lean that he was able to correct with cues.  Ambulation/Gait Ambulation/Gait assistance: Min guard Gait Distance (Feet): 3 Feet Assistive device: Rolling walker (2 wheeled)   Gait velocity: slow   General Gait Details: CGA for safety to ambulate short distance with RW. Pt demonstrates narrow BOS, slowed cadence, step to gait pattern, decreased step length/foot clearance bil, downward gaze, and poor safety awareness. Shoes donned prior to mobility due to amputations. RPE of 7-8/10 indicating "vigorous activity"     Balance Overall balance assessment: Needs assistance Sitting-balance support: No upper extremity supported;Feet supported Sitting balance-Leahy Scale: Good Sitting balance - Comments: Good seated balance at EOB; able to lean forward to assist with donning shoes without LOB.   Standing balance support: Bilateral upper extremity supported;During functional activity Standing  balance-Leahy Scale: Poor Standing balance comment:  Poor standing balance in RW, requiring at least CGA for safety                             Pertinent Vitals/Pain Pain Assessment: No/denies pain    Home Living Family/patient expects to be discharged to:: Private residence   Available Help at Discharge: Family;Available PRN/intermittently Type of Home: House Home Access: Stairs to enter Entrance Stairs-Rails: Can reach both Entrance Stairs-Number of Steps: 3 Home Layout: One level Home Equipment: Walker - 2 wheels;Cane - single point;Wheelchair - manual      Prior Function Level of Independence: Needs assistance   Gait / Transfers Assistance Needed: Uses RW for limited household ambulation, SPC for HD, and WC for community ambulation  ADL's / Homemaking Assistance Needed: Pt states he's Ind with bird baths but has trouble washing shoulders; otherwise is Ind with dressing and toileting. Per chart review from last hospitalization (10 mo ago), nephew assists with bathing and other ADL's. Sister assists with IADL's.        Hand Dominance   Dominant Hand: Right    Extremity/Trunk Assessment   Upper Extremity Assessment Upper Extremity Assessment: Defer to OT evaluation    Lower Extremity Assessment Lower Extremity Assessment: Overall WFL for tasks assessed (Grossly 4/5; no sensory deficits.)    Cervical / Trunk Assessment Cervical / Trunk Assessment: Normal  Communication   Communication: Other (comment) (Garbled speech)  Cognition Arousal/Alertness: Lethargic Behavior During Therapy: WFL for tasks assessed/performed Overall Cognitive Status: Within Functional Limits for tasks assessed                                 General Comments: A&O x3, needed max cues for month and year. Increased lethargy with progression of evaluation.      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 +1 non-pitting edema in bil feet/ankles.     Exercises Other Exercises Other Exercises: Pt able to participate in bed mobility, transfers, and short distance ambulation with RW. Pt demonstrates increased gait deviations and poor standing balance which increase his risk of falls. Increased cueing for safety throughout session. Other Exercises: Pt educated regarding: PT role/POC, DC recommendations, benefits of sitting in recliner/OOB, and safety with mobility.   Assessment/Plan    PT Assessment Patient needs continued PT services  PT Problem List Decreased strength;Decreased mobility;Decreased activity tolerance;Decreased balance;Cardiopulmonary status limiting activity       PT Treatment Interventions Gait training;Stair training;Functional mobility training;Therapeutic activities;Therapeutic exercise;Balance training;Neuromuscular re-education;Wheelchair mobility training    PT Goals (Current goals can be found in the Care Plan section)  Acute Rehab PT Goals Patient Stated Goal: to walk by myself PT Goal Formulation: With patient Time For Goal Achievement: 09/16/20 Potential to Achieve Goals: Fair    Frequency Min 2X/week   Barriers to discharge Decreased caregiver support Per pt, sister works and is only able to provide supervision. Otherwise, pt without additional support at home.       AM-PAC PT "6 Clicks" Mobility  Outcome Measure Help needed turning from your back to your side while in a flat bed without using bedrails?: A Little Help needed moving from lying on your back to sitting on the side of a flat bed without using bedrails?: A Little Help needed moving to and from a bed to a chair (  including a wheelchair)?: A Little Help needed standing up from a chair using your arms (e.g., wheelchair or bedside chair)?: A Little Help needed to walk in hospital room?: A Little Help needed climbing 3-5 steps with a railing? : A Lot 6 Click Score: 17    End of Session Equipment Utilized During Treatment: Gait belt Activity  Tolerance: Patient tolerated treatment well;Patient limited by fatigue Patient left: in chair;with call bell/phone within reach;with chair alarm set Nurse Communication: Mobility status PT Visit Diagnosis: Unsteadiness on feet (R26.81);Muscle weakness (generalized) (M62.81);Difficulty in walking, not elsewhere classified (R26.2)    Time: 7948-0165 PT Time Calculation (min) (ACUTE ONLY): 32 min   Charges:   PT Evaluation $PT Eval Moderate Complexity: 1 Mod PT Treatments $Therapeutic Activity: 8-22 mins       Herminio Commons, PT, DPT 10:55 AM,09/02/20

## 2020-09-02 NOTE — TOC Progression Note (Signed)
Transition of Care [facility]) - Progression Note    Patient Details  Name: Jose [name] MRN: [id number] Date of Birth: 1945-09-18  Transition of Care [facility]) CM/SW Contact  Zigmund Daniel Dorian Pod, RN Phone Number:[phone] 09/02/2020, 3:18 PM  Clinical Narrative:    RN spoke with pt's sister [name] and completed the readmission template. Pt lives with his sister ([name]) and has a good support system in the home. Verified pt able to obtain all his medications and transportation upon discharge and to appointments when he returns home.   Updated family on recommendations for SNF and this process has started current in bed search for availability.  TOC will continue to be available for discharge needs.   Expected Discharge Plan: Skilled Nursing Facility Barriers to Discharge: Continued Medical Work up  Expected Discharge Plan and Services Expected Discharge Plan: [place]       Living arrangements for the past 2 months: Single Family Home                                       Social Determinants of Health (SDOH) Interventions    Readmission Risk Interventions Readmission Risk Prevention Plan 09/02/2020 12/20/2018 10/15/2018  Transportation Screening Complete Complete Complete  PCP or Specialist Appt within 3-5 Days - - (No Data)  [place] or [place] - Complete -  Social Work Consult for [place] Planning/Counseling - - -  Palliative Care Screening - - -  Medication Review Press photographer) Complete Complete -  PCP or Specialist appointment within 3-5 days of discharge Complete - -  [place] or [place] Not Complete - -  [place] or Home Care Consult Pt Refusal Comments SNF recommended - -  Palliative Care Screening Not Applicable - -  Skilled Nursing Facility Complete - -  Some recent data might be hidden

## 2020-09-02 NOTE — Plan of Care (Signed)

## 2020-09-02 NOTE — TOC Initial Note (Signed)
Transition of Care [facility]) - Initial/Assessment Note    Patient Details  Name: Jose [name] MRN: [id number] Date of Birth: 06-Apr-1946  Transition of Care [facility]) CM/SW Contact:    Raina Mina, [place] Phone Number: 09/02/2020, 12:17 PM  Clinical Narrative: CSW spoke with patient about the recommendations of going to a skilled nursing facility. Patient stated he will agree to participate in rehab. Patients goal is to return home after rehab. CSW spoke with patients sister who stated that she has no issues with him returning to her home after rehab. CSW tried to reach the son but was unsuccessful.                    Expected Discharge Plan: Skilled Nursing Facility Barriers to Discharge: Continued Medical Work up   Patient Goals and CMS Choice Patient states their goals for this hospitalization and ongoing recovery are:: To go home after rehab      Expected Discharge Plan and Services Expected Discharge Plan: [place]       Living arrangements for the past 2 months: Single Family Home                                      Prior Living Arrangements/Services Living arrangements for the past 2 months: Single Family Home Lives with:: Siblings (Lives with sister) Patient language and need for interpreter reviewed:: Yes Do you feel safe going back to the place where you live?: Yes      Need for Family Participation in Patient Care: Yes (Comment) Care giver support system in place?: Yes (comment) Current home services: DME Risk manager and Wheelchair) Criminal Activity/Legal Involvement Pertinent to Current Situation/Hospitalization: No - Comment as needed  Activities of Daily Living Home Assistive Devices/Equipment: Radio producer (specify quad or straight),[place] (specify type) ADL Screening (condition at time of admission) Patient's cognitive ability adequate to safely complete daily activities?: Yes Is the patient deaf or have difficulty hearing?: No Does the patient  have difficulty seeing, even when wearing glasses/contacts?: No Does the patient have difficulty concentrating, remembering, or making decisions?: No Patient able to express need for assistance with ADLs?: Yes Does the patient have difficulty dressing or bathing?: No Independently performs ADLs?: Yes (appropriate for developmental age) Does the patient have difficulty walking or climbing stairs?: Yes Weakness of Legs: Both Weakness of Arms/Hands: None  Permission Sought/Granted      Share Information with NAME: [name]     Permission granted to share info w Relationship: Son  Permission granted to share info w Contact Information: [phone]  Emotional Assessment Appearance:: Appears stated age Attitude/Demeanor/Rapport: Engaged Affect (typically observed): Accepting Orientation: : Oriented to Self,Oriented to Place,Oriented to  Time,Oriented to Situation Alcohol / Substance Use: Not Applicable Psych Involvement: No (comment)  Admission diagnosis:  Physical deconditioning [R53.81] Patient Active Problem List   Diagnosis Date Noted  . Physical deconditioning 09/01/2020  . Weight loss 07/11/2020  . Encounter for antineoplastic immunotherapy 02/15/2020  . Goals of care, counseling/discussion 12/23/2019  . 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 Malignant neoplasm of upper lobe of left lung ([place]) 12/23/2019  . Thickening of wall of gallbladder   . Primary adenocarcinoma of left lung ([place])   . Hypokalemia 10/30/2019  . Chronic diastolic CHF (congestive heart failure) ([place]) 10/30/2019  . Benign essential HTN 10/30/2019  . Malignant neoplasm of unknown origin ([place]) 10/30/2019  . Acute respiratory failure with  hypoxia ([place]) 10/30/2019  . End-stage renal disease on hemodialysis ([place]) 10/30/2019  . Diabetes mellitus type 2, controlled, with complications ([place]) 89/21/1941  . SOB (shortness of breath) 10/29/2019  . Pulmonary edema 10/22/2019  . Diarrhea 10/22/2019  . Acute  on chronic diastolic CHF (congestive heart failure) ([place]) 10/22/2019  . HLD (hyperlipidemia)   . Stroke ([place])   . Depression   . Tobacco abuse   . Pleural effusion on left   . Acute urinary retention   . Foot ulcer ([place]) 12/15/2018  . Ischemic foot 10/11/2018  . Atherosclerosis of native arteries of the extremities with gangrene ([place]) 07/09/2018  . Gangrene of toe of right foot ([place]) 06/13/2018  . Type II diabetes mellitus with renal manifestations ([place]) 04/16/2018  . Hypertension 04/16/2018  . ESRD on dialysis ([place]) 04/16/2018  . Malnutrition of moderate degree 03/27/2018  . Renal failure 03/21/2018   PCP:  [name], MD Pharmacy:   [id number] [place], [address] [place] [address] Phone: [phone] Fax: [phone]  [facility] [address] [place] [address] Phone: [phone] Fax: [phone]     Social Determinants of Health (SDOH) Interventions    Readmission Risk Interventions Readmission Risk Prevention Plan 12/20/2018 10/15/2018 10/14/2018  Transportation Screening Complete Complete Complete  PCP or Specialist Appt within 3-5 Days - (No Data) -  [place] or Home Care Consult Complete - Complete  Social Work Consult for [place] Planning/Counseling - - Not Complete  Palliative Care Screening - - Not Applicable  Medication Review Press photographer) Complete - Complete  Some recent data might be hidden

## 2020-09-02 NOTE — Progress Notes (Signed)
[facility], [place] 09/02/20  Subjective:   LOS: 1 Patient presented to the ER for generalized weakness.  He states that he is now not able to walk as good.  He has been missing toes on his right foot.  He has some lower extremity edema.  No shortness of breath.  Appetite is low.  No nausea or vomiting.  No diarrhea.  No fever.  Gets dialysis from the left arm AVF  Last dialysis was on Thursday.  Patient seen resting in bed Says he feels slightly better, but not much Denies nausea but hasn't eaten Denies shortness of breath Says he is still very weak   Objective:  Vital signs in last 24 hours:  Temp:  [97.5 F (36.4 C)-98.7 F (37.1 C)] 98 F (36.7 C) (04/03 0703) Pulse Rate:  [69-81] 73 (04/03 0703) Resp:  [12-21] 20 (04/03 0703) BP: (120-148)/(67-99) 134/99 (04/03 0703) SpO2:  [92 %-100 %] 95 % (04/03 0703) Weight:  [62.3 kg] 62.3 kg (04/02 2000)  Weight change:  Filed Weights   09/01/20 2000  Weight: 62.3 kg    Intake/Output:    Intake/Output Summary (Last 24 hours) at 09/02/2020 0946 Last data filed at 09/01/2020 1845 Gross per 24 hour  Intake --  Output 1500 ml  Net -1500 ml    Physical Exam: General:  No acute distress, laying in the bed  HEENT  anicteric, moist oral mucous membrane  Pulm/lungs  normal breathing effort, mild basilar crackles, room air  CVS/Heart  regular rhythm, no rub or gallop  Abdomen:   Soft, nontender  Extremities:  + peripheral edema  Neurologic:  Alert, oriented, able to follow commands  Skin:  No acute rashes  Left arm aneursmal AVF     Basic Metabolic Panel:  Recent Labs  Lab 08/30/20 1827 09/01/20 1049 09/01/20 2026  NA 136 134*  --   K 3.6 3.6  --   CL 94* 93*  --   CO2 29 27  --   GLUCOSE 89 123*  --   BUN 20 36*  --   CREATININE 5.32* 7.73*  --   CALCIUM 9.0 9.6  --   PHOS  --   --  3.3     CBC: Recent Labs  Lab 08/30/20 1827 09/01/20 1049  WBC 8.9 8.1  NEUTROABS 6.8  --    HGB 13.8 14.3  HCT 41.3 43.0  MCV 96.5 98.2  PLT 138* 133*      Lab Results  Component Value Date   HEPBSAG NON REACTIVE 09/01/2020   HEPBSAB Reactive 03/22/2018   HEPBIGM Negative 03/22/2018      Microbiology:  Recent Results (from the past 240 hour(s))  Resp Panel by RT-PCR (Flu A&B, Covid) Nasopharyngeal Swab     Status: None   Collection Time: 09/01/20  1:25 PM   Specimen: Nasopharyngeal Swab; Nasopharyngeal(NP) swabs in vial transport medium  Result Value Ref Range Status   SARS Coronavirus 2 by RT PCR NEGATIVE NEGATIVE Final    Comment: (NOTE) SARS-CoV-2 target nucleic acids are NOT DETECTED.  The SARS-CoV-2 RNA is generally detectable in upper respiratory specimens during the acute phase of infection. The lowest concentration of SARS-CoV-2 viral copies this assay can detect is 138 copies/mL. A negative result does not preclude SARS-Cov-2 infection and should not be used as the sole basis for treatment or other patient management decisions. A negative result may occur with  improper specimen collection/handling, submission of specimen other than nasopharyngeal swab, presence  of viral mutation(s) within the areas targeted by this assay, and inadequate number of viral copies(<138 copies/mL). A negative result must be combined with clinical observations, patient history, and epidemiological information. The expected result is Negative.  Fact Sheet for Patients:  EntrepreneurPulse.com.au  Fact Sheet for Healthcare Providers:  IncredibleEmployment.be  This test is no t yet approved or cleared by the [place] FDA and  has been authorized for detection and/or diagnosis of SARS-CoV-2 by FDA under an Emergency Use Authorization (EUA). This EUA will remain  in effect (meaning this test can be used) for the duration of the COVID-19 declaration under Section 564(b)(1) of the Act, 21 U.S.C.section 360bbb-3(b)(1), unless the  authorization is terminated  or revoked sooner.       Influenza A by PCR NEGATIVE NEGATIVE Final   Influenza B by PCR NEGATIVE NEGATIVE Final    Comment: (NOTE) The Xpert Xpress SARS-CoV-2/FLU/RSV plus assay is intended as an aid in the diagnosis of influenza from Nasopharyngeal swab specimens and should not be used as a sole basis for treatment. Nasal washings and aspirates are unacceptable for Xpert Xpress SARS-CoV-2/FLU/RSV testing.  Fact Sheet for Patients: EntrepreneurPulse.com.au  Fact Sheet for Healthcare Providers: IncredibleEmployment.be  This test is not yet approved or cleared by the [place] FDA and has been authorized for detection and/or diagnosis of SARS-CoV-2 by FDA under an Emergency Use Authorization (EUA). This EUA will remain in effect (meaning this test can be used) for the duration of the COVID-19 declaration under Section 564(b)(1) of the Act, 21 U.S.C. section 360bbb-3(b)(1), unless the authorization is terminated or revoked.  Performed at [facility] [address] [place], [place] [address]   MRSA PCR Screening     Status: None   Collection Time: 09/02/20  6:03 AM   Specimen: Nasopharyngeal  Result Value Ref Range Status   MRSA by PCR NEGATIVE NEGATIVE Final    Comment:        The GeneXpert MRSA Assay (FDA approved for NASAL specimens only), is one component of a comprehensive MRSA colonization surveillance program. It is not intended to diagnose MRSA infection nor to guide or monitor treatment for MRSA infections. Performed at [facility], [address] [place], [place] [address]     Coagulation Studies: No results for input(s): LABPROT, INR in the last 72 hours.  Urinalysis: No results for input(s): COLORURINE, LABSPEC, PHURINE, GLUCOSEU, HGBUR, BILIRUBINUR, KETONESUR, PROTEINUR, UROBILINOGEN, NITRITE, LEUKOCYTESUR in the last 72 hours.  Invalid input(s): APPERANCEUR     Imaging: DG Chest 2 View  Result Date: 09/01/2020 CLINICAL DATA:  Cough with weakness 1 week. EXAM: CHEST - 2 VIEW COMPARISON:  08/30/2020 and 06/05/2020 as well as CT chest 08/30/2020 FINDINGS: Lungs are adequately inflated with slight worsening hazy density over the left base/retrocardiac region with blunting of the left costophrenic angle as findings may be due to effusion/atelectasis versus infection. Subtle hazy prominence of the perihilar vessels unchanged. Stable cardiomegaly. Remainder of the exam is unchanged. IMPRESSION: 1. Slight worsening hazy density over the left base/retrocardiac region which may be due to effusion/atelectasis versus infection. 2. Stable cardiomegaly with suggestion of minimal vascular congestion. Electronically Signed   By: Marin Olp M.D.   On: 09/01/2020 12:31     Medications:    Scheduled Meds: Continuous Infusions: PRN Meds:.    Assessment/ Plan:  75 y.o. male with  was admitted on 09/01/2020 for  Active Problems:   Type II diabetes mellitus with renal manifestations ([place])   ESRD on  dialysis [facility])   Gangrene of toe of right foot ([place])   Atherosclerosis of native arteries of the extremities with gangrene ([place])   HLD (hyperlipidemia)   Stroke ([place])   Depression   Tobacco abuse   Chronic diastolic CHF (congestive heart failure) (HCC)   Primary adenocarcinoma of left lung (HCC)   Malignant pleural effusion   Physical deconditioning  Physical deconditioning [R53.81] x 2 weeks   [facility] TuThSa-2  wt 66.5 kg  #. ESRD -received dialysis yesterday -Tolerated well  -UF-1.5L removed - Next treatment scheduled for Tuesday  #. Anemia of CKD  Lab Results  Component Value Date   HGB 14.3 09/01/2020   Avoid Epogen as patient has known malignancy Given chemotherapy and is followed by Dr. Tasia Catchings at the cancer center  #. Secondary hyperparathyroidism of renal origin N 25.81      Component Value Date/Time   PTH 226 (H) 03/25/2018  1557   Lab Results  Component Value Date   PHOS 3.3 09/01/2020  Continue Calcium acetate with meals Monitor calcium and phos level during this admission   #.  Generalized weakness Plan as per hospitalist PT eval ordered    LOS: [place] 4/3/20229:46 AM  [facility] [place], [place]

## 2020-09-03 DIAGNOSIS — E43 Unspecified severe protein-calorie malnutrition: Secondary | ICD-10-CM

## 2020-09-03 DIAGNOSIS — Z992 Dependence on renal dialysis: Secondary | ICD-10-CM

## 2020-09-03 DIAGNOSIS — E039 Hypothyroidism, unspecified: Secondary | ICD-10-CM

## 2020-09-03 DIAGNOSIS — J910 Malignant pleural effusion: Secondary | ICD-10-CM | POA: Diagnosis not present

## 2020-09-03 DIAGNOSIS — C3492 Malignant neoplasm of unspecified part of left bronchus or lung: Secondary | ICD-10-CM | POA: Diagnosis not present

## 2020-09-03 DIAGNOSIS — R531 Weakness: Secondary | ICD-10-CM | POA: Diagnosis not present

## 2020-09-03 DIAGNOSIS — I5032 Chronic diastolic (congestive) heart failure: Secondary | ICD-10-CM

## 2020-09-03 DIAGNOSIS — N186 End stage renal disease: Secondary | ICD-10-CM | POA: Diagnosis not present

## 2020-09-03 LAB — GLUCOSE, CAPILLARY
Glucose-Capillary: 132 mg/dL — ABNORMAL HIGH (ref 70–99)
Glucose-Capillary: 140 mg/dL — ABNORMAL HIGH (ref 70–99)
Glucose-Capillary: 204 mg/dL — ABNORMAL HIGH (ref 70–99)
Glucose-Capillary: 108 mg/dL — ABNORMAL HIGH (ref 70–99)

## 2020-09-03 MED ORDER — PROSOURCE PLUS PO LIQD
30.0000 mL | Freq: Two times a day (BID) | ORAL | Status: DC
  Administered 2020-09-03 – 2020-09-04 (×2): 30 mL via ORAL
  Filled 2020-09-03 (×2): qty 30

## 2020-09-03 MED ORDER — RENA-VITE PO TABS
1.0000 | ORAL_TABLET | Freq: Every day | ORAL | Status: DC
  Administered 2020-09-03: 1 via ORAL
  Filled 2020-09-03 (×2): qty 1

## 2020-09-03 MED ORDER — CARVEDILOL 3.125 MG PO TABS
3.1250 mg | ORAL_TABLET | Freq: Two times a day (BID) | ORAL | Status: DC
  Administered 2020-09-03: 17:00:00 3.125 mg via ORAL
  Filled 2020-09-03: qty 1

## 2020-09-03 MED ORDER — IPRATROPIUM-ALBUTEROL 0.5-2.5 (3) MG/3ML IN SOLN
3.0000 mL | Freq: Four times a day (QID) | RESPIRATORY_TRACT | Status: DC
  Administered 2020-09-03 – 2020-09-04 (×4): 3 mL via RESPIRATORY_TRACT
  Filled 2020-09-03 (×4): qty 3

## 2020-09-03 MED ORDER — LOSARTAN POTASSIUM 25 MG PO TABS: 50.0000 mg | ORAL_TABLET | Freq: Every day | ORAL | Status: DC

## 2020-09-03 NOTE — Progress Notes (Signed)
[facility], [place] 09/03/20  Subjective:   LOS: 2 Patient presented to the ER for generalized weakness.  He states that he is now not able to walk as good..  No shortness of breath.  Appetite is low.  No nausea or vomiting.  No diarrhea.  No fever.  Gets dialysis from the left arm AVF  Last dialysis was on Thursday.  Patient seen resting in bed Alert and oriented Eating breakfast Denies nausea Denies shortness of breath Says he sat in chair yesterday  Objective:  Vital signs in last 24 hours:  Temp:  [97.5 F (36.4 C)-98.8 F (37.1 C)] 97.5 F (36.4 C) (04/04 1100) Pulse Rate:  [72-81] 73 (04/04 1100) Resp:  [16-18] 16 (04/04 1100) BP: (109-139)/(75-92) 121/87 (04/04 1100) SpO2:  [93 %-99 %] 93 % (04/04 1320)  Weight change:  Filed Weights   09/01/20 2000  Weight: 62.3 kg    Intake/Output:    Intake/Output Summary (Last 24 hours) at 09/03/2020 1501 Last data filed at 09/03/2020 1010 Gross per 24 hour  Intake 240 ml  Output --  Net 240 ml    Physical Exam: General:  No acute distress, laying in the bed  HEENT  anicteric, moist oral mucous membrane  Pulm/lungs  normal breathing effort, clear, room air  CVS/Heart  regular rhythm, no rub or gallop  Abdomen:   Soft, nontender  Extremities:  + peripheral edema  Neurologic:  Alert, oriented, able to follow commands  Skin:  No acute rashes  Left arm aneursmal AVF     Basic Metabolic Panel:  Recent Labs  Lab 08/30/20 1827 09/01/20 1049 09/01/20 2026  NA 136 134*  --   K 3.6 3.6  --   CL 94* 93*  --   CO2 29 27  --   GLUCOSE 89 123*  --   BUN 20 36*  --   CREATININE 5.32* 7.73*  --   CALCIUM 9.0 9.6  --   PHOS  --   --  3.3     CBC: Recent Labs  Lab 08/30/20 1827 09/01/20 1049  WBC 8.9 8.1  NEUTROABS 6.8  --   HGB 13.8 14.3  HCT 41.3 43.0  MCV 96.5 98.2  PLT 138* 133*      Lab Results  Component Value Date   HEPBSAG NON REACTIVE 09/01/2020   HEPBSAB Reactive  03/22/2018   HEPBIGM Negative 03/22/2018      Microbiology:  Recent Results (from the past 240 hour(s))  Resp Panel by RT-PCR (Flu A&B, Covid) Nasopharyngeal Swab     Status: None   Collection Time: 09/01/20  1:25 PM   Specimen: Nasopharyngeal Swab; Nasopharyngeal(NP) swabs in vial transport medium  Result Value Ref Range Status   SARS Coronavirus 2 by RT PCR NEGATIVE NEGATIVE Final    Comment: (NOTE) SARS-CoV-2 target nucleic acids are NOT DETECTED.  The SARS-CoV-2 RNA is generally detectable in upper respiratory specimens during the acute phase of infection. The lowest concentration of SARS-CoV-2 viral copies this assay can detect is 138 copies/mL. A negative result does not preclude SARS-Cov-2 infection and should not be used as the sole basis for treatment or other patient management decisions. A negative result may occur with  improper specimen collection/handling, submission of specimen other than nasopharyngeal swab, presence of viral mutation(s) within the areas targeted by this assay, and inadequate number of viral copies(<138 copies/mL). A negative result must be combined with clinical observations, patient history, and epidemiological information. The expected result  is Negative.  Fact Sheet for Patients:  EntrepreneurPulse.com.au  Fact Sheet for Healthcare Providers:  IncredibleEmployment.be  This test is no t yet approved or cleared by the [place] FDA and  has been authorized for detection and/or diagnosis of SARS-CoV-2 by FDA under an Emergency Use Authorization (EUA). This EUA will remain  in effect (meaning this test can be used) for the duration of the COVID-19 declaration under Section 564(b)(1) of the Act, 21 U.S.C.section 360bbb-3(b)(1), unless the authorization is terminated  or revoked sooner.       Influenza A by PCR NEGATIVE NEGATIVE Final   Influenza B by PCR NEGATIVE NEGATIVE Final    Comment:  (NOTE) The Xpert Xpress SARS-CoV-2/FLU/RSV plus assay is intended as an aid in the diagnosis of influenza from Nasopharyngeal swab specimens and should not be used as a sole basis for treatment. Nasal washings and aspirates are unacceptable for Xpert Xpress SARS-CoV-2/FLU/RSV testing.  Fact Sheet for Patients: EntrepreneurPulse.com.au  Fact Sheet for Healthcare Providers: IncredibleEmployment.be  This test is not yet approved or cleared by the [place] FDA and has been authorized for detection and/or diagnosis of SARS-CoV-2 by FDA under an Emergency Use Authorization (EUA). This EUA will remain in effect (meaning this test can be used) for the duration of the COVID-19 declaration under Section 564(b)(1) of the Act, 21 U.S.C. section 360bbb-3(b)(1), unless the authorization is terminated or revoked.  Performed at [facility], [address] [place], [place] [address]   MRSA PCR Screening     Status: None   Collection Time: 09/02/20  6:03 AM   Specimen: Nasopharyngeal  Result Value Ref Range Status   MRSA by PCR NEGATIVE NEGATIVE Final    Comment:        The GeneXpert MRSA Assay (FDA approved for NASAL specimens only), is one component of a comprehensive MRSA colonization surveillance program. It is not intended to diagnose MRSA infection nor to guide or monitor treatment for MRSA infections. Performed at [facility], [address] [place], [place] [address]     Coagulation Studies: No results for input(s): LABPROT, INR in the last 72 hours.  Urinalysis: No results for input(s): COLORURINE, LABSPEC, PHURINE, GLUCOSEU, HGBUR, BILIRUBINUR, KETONESUR, PROTEINUR, UROBILINOGEN, NITRITE, LEUKOCYTESUR in the last 72 hours.  Invalid input(s): APPERANCEUR    Imaging: No results found.   Medications:    Scheduled Meds: Continuous Infusions: PRN Meds:.    Assessment/ Plan:  75 y.o. male with  was  admitted on 09/01/2020 for  Active Problems:   Type II diabetes mellitus with renal manifestations (HCC)   ESRD on dialysis ([place])   Gangrene of toe of right foot (HCC)   Atherosclerosis of native arteries of the extremities with gangrene (HCC)   HLD (hyperlipidemia)   Stroke (HCC)   Anxiety and depression   Tobacco abuse   Chronic diastolic CHF (congestive heart failure) ([place])   Type 2 diabetes mellitus with hyperlipidemia ([place])   Primary adenocarcinoma of left lung (HCC)   Malignant pleural effusion   Physical deconditioning   Hypervolemia   Generalized weakness   Hypothyroidism   Protein-calorie malnutrition, severe  Physical deconditioning [R53.81] x 2 weeks   [facility] TuThSa-2  wt 66.5 kg  #. ESRD -Received dialysis on Saturday - Next treatment scheduled for Tuesday  #. Anemia of CKD  Lab Results  Component Value Date   HGB 14.3 09/01/2020   Avoid Epogen as patient has known malignancy Given chemotherapy and is followed by Dr. Tasia Catchings at  the cancer center  #. Secondary hyperparathyroidism of renal origin N 25.81      Component Value Date/Time   PTH 226 (H) 03/25/2018 1557   Lab Results  Component Value Date   PHOS 3.3 09/01/2020  Continue Calcium acetate with meals Phosphorus within range   #.  Generalized weakness Plan as per hospitalist PT recommending rehab Patient agreeable   LOS: [place] 4/4/20223:01 PM  [place], [place]  Patient was seen and evaluated with Colon Flattery, NP.  Plan of care was discussed with patient as well as NP.  She assisted with transcription of the note.

## 2020-09-03 NOTE — Progress Notes (Addendum)
Patient ID: Jose [name], male   DOB: [birth year], 75 y.o.   MRN: [id number] [facility] PROGRESS NOTE  Jose [name] FTD:[id number] DOB: Oct 16, 1945 DOA: 09/01/2020 PCP: [name], MD  HPI/Subjective: Patient feels okay but still feels weak.  Initially came in with fluid overload and generalized weakness.  Some cough.  Occasional wheeze.  Objective: Vitals:   09/03/20 1320 09/03/20 1547  BP:  138/78  Pulse:  70  Resp:  18  Temp:  97.8 F (36.6 C)  SpO2: 93% 99%    Intake/Output Summary (Last 24 hours) at 09/03/2020 1625 Last data filed at 09/03/2020 1010 Gross per 24 hour  Intake 240 ml  Output --  Net 240 ml   Filed Weights   09/01/20 2000  Weight: 62.3 kg    ROS: Review of Systems  Respiratory: Positive for cough, shortness of breath and wheezing.   Cardiovascular: Negative for chest pain.  Gastrointestinal: Negative for abdominal pain, nausea and vomiting.   Exam: Physical Exam HENT:     Head: Normocephalic.  Eyes:     General: Lids are normal.     Conjunctiva/sclera: Conjunctivae normal.     Pupils: Pupils are equal, round, and reactive to light.  Cardiovascular:     Rate and Rhythm: Normal rate and regular rhythm.     Heart sounds: Normal heart sounds, S1 normal and S2 normal.  Pulmonary:     Breath sounds: Examination of the right-lower field reveals decreased breath sounds. Examination of the left-lower field reveals decreased breath sounds. Decreased breath sounds present. No wheezing, rhonchi or rales.  Abdominal:     Palpations: Abdomen is soft.     Tenderness: There is no abdominal tenderness.  Musculoskeletal:     Right ankle: No swelling.     Left ankle: No swelling.  Skin:    General: Skin is warm.     Findings: No rash.  Neurological:     Mental Status: He is alert and oriented to person, place, and time.       Data Reviewed: Basic Metabolic Panel: Recent Labs  Lab 08/30/20 1827 09/01/20 1049 09/01/20 2026  NA 136 134*  --    K 3.6 3.6  --   CL 94* 93*  --   CO2 29 27  --   GLUCOSE 89 123*  --   BUN 20 36*  --   CREATININE 5.32* 7.73*  --   CALCIUM 9.0 9.6  --   PHOS  --   --  3.3   Liver Function Tests: Recent Labs  Lab 08/30/20 1827  AST 19  ALT 13  ALKPHOS 36*  BILITOT 1.2  PROT 6.9  ALBUMIN 3.5   CBC: Recent Labs  Lab 08/30/20 1827 09/01/20 1049  WBC 8.9 8.1  NEUTROABS 6.8  --   HGB 13.8 14.3  HCT 41.3 43.0  MCV 96.5 98.2  PLT 138* 133*   BNP (last 3 results) Recent Labs    10/29/19 2021 08/30/20 1749 09/01/20 1049  BNP 122.5* >4,500.0* >4,500.0*    CBG: Recent Labs  Lab 09/02/20 1543 09/02/20 2041 09/03/20 0735 09/03/20 1159 09/03/20 1547  GLUCAP 130* 120* 132* 140* 204*    Recent Results (from the past 240 hour(s))  Resp Panel by RT-PCR (Flu A&B, Covid) Nasopharyngeal Swab     Status: None   Collection Time: 09/01/20  1:25 PM   Specimen: Nasopharyngeal Swab; Nasopharyngeal(NP) swabs in vial transport medium  Result Value Ref Range Status   SARS Coronavirus 2  by RT PCR NEGATIVE NEGATIVE Final    Comment: (NOTE) SARS-CoV-2 target nucleic acids are NOT DETECTED.  The SARS-CoV-2 RNA is generally detectable in upper respiratory specimens during the acute phase of infection. The lowest concentration of SARS-CoV-2 viral copies this assay can detect is 138 copies/mL. A negative result does not preclude SARS-Cov-2 infection and should not be used as the sole basis for treatment or other patient management decisions. A negative result may occur with  improper specimen collection/handling, submission of specimen other than nasopharyngeal swab, presence of viral mutation(s) within the areas targeted by this assay, and inadequate number of viral copies(<138 copies/mL). A negative result must be combined with clinical observations, patient history, and epidemiological information. The expected result is Negative.  Fact Sheet for Patients:   EntrepreneurPulse.com.au  Fact Sheet for Healthcare Providers:  IncredibleEmployment.be  This test is no t yet approved or cleared by the [place] FDA and  has been authorized for detection and/or diagnosis of SARS-CoV-2 by FDA under an Emergency Use Authorization (EUA). This EUA will remain  in effect (meaning this test can be used) for the duration of the COVID-19 declaration under Section 564(b)(1) of the Act, 21 U.S.C.section 360bbb-3(b)(1), unless the authorization is terminated  or revoked sooner.       Influenza A by PCR NEGATIVE NEGATIVE Final   Influenza B by PCR NEGATIVE NEGATIVE Final    Comment: (NOTE) The Xpert Xpress SARS-CoV-2/FLU/RSV plus assay is intended as an aid in the diagnosis of influenza from Nasopharyngeal swab specimens and should not be used as a sole basis for treatment. Nasal washings and aspirates are unacceptable for Xpert Xpress SARS-CoV-2/FLU/RSV testing.  Fact Sheet for Patients: EntrepreneurPulse.com.au  Fact Sheet for Healthcare Providers: IncredibleEmployment.be  This test is not yet approved or cleared by the [place] FDA and has been authorized for detection and/or diagnosis of SARS-CoV-2 by FDA under an Emergency Use Authorization (EUA). This EUA will remain in effect (meaning this test can be used) for the duration of the COVID-19 declaration under Section 564(b)(1) of the Act, 21 U.S.C. section 360bbb-3(b)(1), unless the authorization is terminated or revoked.  Performed at [facility], [address] [place], [place] [address]   MRSA PCR Screening     Status: None   Collection Time: 09/02/20  6:03 AM   Specimen: Nasopharyngeal  Result Value Ref Range Status   MRSA by PCR NEGATIVE NEGATIVE Final    Comment:        The GeneXpert MRSA Assay (FDA approved for NASAL specimens only), is one component of a comprehensive MRSA  colonization surveillance program. It is not intended to diagnose MRSA infection nor to guide or monitor treatment for MRSA infections. Performed at [facility], [address] [place], [place] [address]       Scheduled Meds: . (feeding supplement) PROSource Plus  30 mL Oral BID BM  . amLODipine  10 mg Oral Daily  . aspirin EC  81 mg Oral Daily  . atorvastatin  10 mg Oral QHS  . calcium acetate  1,334 mg Oral TID WC  . Chlorhexidine Gluconate Cloth  6 each Topical Q0600  . clopidogrel  75 mg Oral Daily  . doxycycline  100 mg Oral Q12H  . escitalopram  10 mg Oral Daily  . feeding supplement (NEPRO CARB STEADY)  237 mL Oral BID BM  . gabapentin  100 mg Oral TID  . heparin  5,000 Units Subcutaneous Q8H  . insulin aspart  0-6 Units  Subcutaneous TID WC  . ipratropium-albuterol  3 mL Nebulization Q6H  . levothyroxine  50 mcg Oral QAC breakfast  . multivitamin  1 tablet Oral QHS  . tamsulosin  0.4 mg Oral Daily  . torsemide  40 mg Oral Daily   Continuous Infusions: . methocarbamol (ROBAXIN) IV      Assessment/Plan:  1. Fluid overload, chronic diastolic congestive heart failure.  Dialysis to remove fluid.  Add Coreg and losartan.  Continue Demadex. 2. Generalized weakness.  Physical therapy and Occupational Therapy recommending rehab. 3. Questional pneumonia.  Started doxycycline.  Also added nebulizers. 4. 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  Stage IV lung cancer.  Patient has poor appetite.  Continue nephro drinks.  Appreciate palliative care consultation. 5. Hypothyroidism unspecified on levothyroxine 6. Anxiety and depression on Lexapro 7. History of CAD on aspirin, atorvastatin and Plavix 8. Essential hypertension on amlodipine 9. Neuropathy.  Continue low-dose gabapentin 10. Type 2 diabetes mellitus with hyperlipidemia.  Discontinue atorvastatin.  With hemoglobin A1c on the lower side at 6.3 can watch off medications at  this point 11. BPH on Flomax 12. Severe protein calorie malnutrition        Code Status:     Code Status Orders  (From admission, onward)         Start     Ordered   09/01/20 1952  Full code  Continuous        09/01/20 1951        Code Status History    Date Active Date Inactive Code Status Order ID Comments User Context   10/30/2019 0128 11/05/2019 2211 Full Code [id number]  Elwyn Reach, MD Inpatient   10/22/2019 2304 10/24/2019 2140 Full Code [id number]  Ivor Costa, MD Inpatient   12/15/2018 1639 12/21/2018 2155 Full Code [id number]  Saundra Shelling, MD Inpatient   10/11/2018 2235 10/16/2018 1851 Full Code [id number]  Jose Basta, MD ED   10/07/2018 1042 10/07/2018 1733 Full Code [id number]  Algernon Huxley, MD Inpatient   09/30/2018 0956 09/30/2018 1441 Full Code [id number]  Algernon Huxley, MD Inpatient   08/30/2018 1224 08/30/2018 2034 Full Code [id number]  Algernon Huxley, MD Inpatient   07/23/2018 1111 07/23/2018 1517 Full Code [id number]  Samara Deist, [facility] Inpatient   06/13/2018 0135 06/17/2018 2206 Full Code [id number]  Lance Coon, MD ED   03/21/2018 1315 03/28/2018 1004 Full Code [id number]  Saundra Shelling, MD ED   Advance Care Planning Activity     Family Communication: spoke with son on the the phone Disposition Plan: Status is: Inpatient  Dispo: The patient is from: Home              Anticipated d/c is to: Rehab              Patient currently very weak.  Dialysis to help manage fluid.  Will have dialysis tomorrow and will reassess   Difficult to place patient.  No. Consultants:  Nephrology   Palliative care  Oncology  Time spent: 28 minutes  [place]

## 2020-09-03 NOTE — Consult Note (Signed)
[place]  [phone] [phone]   Name: Jose [name] Date: 09/03/2020 MRN: [id number]  DOB: [birth year]  Patient Care Team: [name], MD as PCP - General (Family Medicine) Telford Nab, RN as Oncology Nurse Navigator Earlie Server, MD as Consulting Physician (Hematology and Oncology)    REASON FOR CONSULTATION: Jose [name] is a 75 y.o. 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  Patient was admitted to the hospital on 09/02/2018  with volume overload in setting of chronic dialysis.  Palliative care was consulted to address goals.  SOCIAL HISTORY:     reports that he has been smoking cigarettes. He has been smoking about 0.10 packs per day. He has never used smokeless tobacco. He reports previous alcohol use. He reports that he does not use drugs.  Patient is a widower.  Patient has a son who lives nearby. He also has a daughter who is less involved.  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  Patient says at baseline he lives at home with his sister.  However, she works most of the day so he is home alone.  He says he is ambulatory with use of a walker and is able to provide for his own self-care.  ADVANCE DIRECTIVES:  Not on file  CODE STATUS: Full code  PAST MEDICAL HISTORY: Past Medical History:  Diagnosis Date  . Chronic kidney disease   . Depression   . Diabetes mellitus without complication ([place])   . Glaucoma   . Glaucoma   . Gout   . Heart murmur   . HLD (hyperlipidemia)   . Hypertension   . Primary adenocarcinoma of left lung ([place])   . Prostate cancer ([place])   . Renal failure    dialysis t/t/s  . Renal insufficiency   . Stroke ([place])    tia x 2    PAST SURGICAL HISTORY:  Past Surgical History:   Procedure Laterality Date  . AMPUTATION TOE Bilateral 07/23/2018   Procedure: TOE MPJ RIGHT 3RD AND LEFT 2ND;  Surgeon: Samara Deist, DPM;  Location: [facility];  Service: Podiatry;  Laterality: Bilateral;  . APPENDECTOMY    . AV FISTULA PLACEMENT Left 06/09/2018   Procedure: ARTERIOVENOUS (AV) FISTULA CREATION ( BRACHIAL CEPHALIC);  Surgeon: Algernon Huxley, MD;  Location: [facility];  Service: Vascular;  Laterality: Left;  . CHOLECYSTECTOMY    . DIALYSIS/PERMA CATHETER INSERTION N/A 03/24/2018   Procedure: DIALYSIS/PERMA CATHETER INSERTION;  Surgeon: Katha Cabal, MD;  Location: [place] [facility];  Service: Cardiovascular;  Laterality: N/A;  . DIALYSIS/PERMA CATHETER REMOVAL N/A 09/06/2018   Procedure: DIALYSIS/PERMA CATHETER REMOVAL;  Surgeon: Algernon Huxley, MD;  Location: [place] [facility];  Service: Cardiovascular;  Laterality: N/A;  . INSERTION PROSTATE RADIATION SEED    . IR PERC PLEURAL DRAIN W/INDWELL CATH W/IMG GUIDE  11/03/2019  . IRRIGATION AND DEBRIDEMENT FOOT Right 12/17/2018   Procedure: IRRIGATION AND DEBRIDEMENT FOOT;  Surgeon: Samara Deist, DPM;  Location: [facility];  Service: Podiatry;  Laterality: Right;  . LOWER EXTREMITY ANGIOGRAPHY Right 06/16/2018   Procedure: LOWER EXTREMITY ANGIOGRAPHY;  Surgeon: Algernon Huxley, MD;  Location: [place] [facility];  Service: Cardiovascular;  Laterality: Right;  . LOWER EXTREMITY ANGIOGRAPHY Left 08/30/2018   Procedure: LOWER EXTREMITY ANGIOGRAPHY;  Surgeon: Algernon Huxley, MD;  Location: [place] [facility];  Service: Cardiovascular;  Laterality: Left;  . LOWER EXTREMITY ANGIOGRAPHY Right 09/30/2018   Procedure: LOWER EXTREMITY ANGIOGRAPHY;  Surgeon: Algernon Huxley, MD;  Location: [place] [facility];  Service: Cardiovascular;  Laterality: Right;  . LOWER EXTREMITY ANGIOGRAPHY Left 10/07/2018   Procedure: LOWER EXTREMITY ANGIOGRAPHY;  Surgeon: Algernon Huxley, MD;  Location: [place] [facility];  Service: Cardiovascular;  Laterality: Left;  .  LOWER EXTREMITY ANGIOGRAPHY Right 12/20/2018   Procedure: Lower Extremity Angiography;  Surgeon: Algernon Huxley, MD;  Location: [place] [facility];  Service: Cardiovascular;  Laterality: Right;  . TRANSMETATARSAL AMPUTATION Bilateral 10/13/2018   Procedure: 1.  Amputation right great toe MTPJ 2.  Amputation right second toe MTPJ 3.  Amputation right fourth toe MTPJ 4.  Amputation right fifth toe MTPJ 5.  Excision distal second metatarsal head right foot 6.  Excision distal third metatarsal head right foot 7.  Amputation left third toe 8.  Incision and drainage infectionleft 2nd toe amputation site. ;  Surgeon: Samara Deist, DPM;  Location    HEMATOLOGY/ONCOLOGY HISTORY:  Oncology History  Primary adenocarcinoma of left lung ([place])  11/04/2019 Initial Diagnosis   Primary adenocarcinoma of left lung ([place])   12/23/2019 Cancer Staging   Staging form: Lung, AJCC 8th Edition - Clinical: [name] IVA (cT2, cN2, pM1a) - Signed by Earlie Server, MD on 12/23/2019   01/04/2020 - 08/22/2020 Chemotherapy         09/12/2020 -  Chemotherapy    Patient is on Treatment Plan: LUNG NSCLC CARBOPLATIN / PACLITAXEL Q21D X 6 CYCLES        ALLERGIES:  is allergic to penicillin g.  MEDICATIONS:  Current Facility-Administered Medications  Medication Dose Route Frequency Provider Last Rate Last Admin  . (feeding supplement) PROSource Plus liquid 30 mL  30 mL Oral BID BM Wieting, Richard, MD      . acetaminophen (TYLENOL) tablet 650 mg  650 mg Oral Q6H PRN Clarnce Flock, MD      . amLODipine (NORVASC) tablet 10 mg  10 mg Oral Daily Clarnce Flock, MD   10 mg at 09/03/20 0919  . aspirin EC tablet 81 mg  81 mg Oral Daily Clarnce Flock, MD   81 mg at 09/03/20 0919  . atorvastatin (LIPITOR) tablet 10 mg  10 mg Oral QHS Clarnce Flock, MD   10 mg at 09/02/20 2036  . calcium acetate (PHOSLO) capsule 1,334 mg  1,334 mg Oral TID WC Clarnce Flock, MD   1,334 mg at 09/03/20 [phone]  . Chlorhexidine Gluconate  Cloth 2 % PADS 6 each  6 each Topical Q0600 Clarnce Flock, MD   6 each at 09/03/20 [phone]  . clopidogrel (PLAVIX) tablet 75 mg  75 mg Oral Daily Clarnce Flock, MD   75 mg at 09/03/20 0919  . doxycycline (VIBRA-TABS) tablet 100 mg  100 mg Oral Q12H Loletha Grayer, MD   100 mg at 09/03/20 0919  . escitalopram (LEXAPRO) tablet 10 mg  10 mg Oral Daily Clarnce Flock, MD   10 mg at 09/03/20 0737  . feeding supplement (NEPRO CARB STEADY) liquid 237 mL  237 mL Oral BID BM Clarnce Flock, MD   237 mL at 09/03/20 0923  . gabapentin (NEURONTIN) capsule 100 mg  100 mg Oral TID Clarnce Flock, MD   100 mg at 09/03/20 0919  . heparin injection 5,000 Units  5,000 Units Subcutaneous Q8H Loletha Grayer, MD  5,000 Units at 09/03/20 0527  . insulin aspart (novoLOG) injection 0-6 Units  0-6 Units Subcutaneous TID WC Clarnce Flock, MD   2 Units at 09/02/20 1328  . ipratropium-albuterol (DUONEB) 0.5-2.5 (3) MG/3ML nebulizer solution 3 mL  3 mL Nebulization Q6H Wieting, Richard, MD      . levothyroxine (SYNTHROID) tablet 50 mcg  50 mcg Oral QAC breakfast Clarnce Flock, MD   50 mcg at 09/03/20 3790  . methocarbamol (ROBAXIN) 500 mg in dextrose 5 % 50 mL IVPB  500 mg Intravenous Q6H PRN Clarnce Flock, MD      . multivitamin (RENA-VIT) tablet 1 tablet  1 tablet Oral QHS Wieting, Richard, MD      . ondansetron [facility]) tablet 4 mg  4 mg Oral Q6H PRN Clarnce Flock, MD       Or  . ondansetron [facility]) injection 4 mg  4 mg Intravenous Q6H PRN Clarnce Flock, MD      . oxyCODONE (Oxy IR/ROXICODONE) immediate release tablet 5 mg  5 mg Oral Q4H PRN Clarnce Flock, MD      . polyethylene glycol (MIRALAX / GLYCOLAX) packet 17 g  17 g Oral Daily PRN Clarnce Flock, MD      . tamsulosin [facility]) capsule 0.4 mg  0.4 mg Oral Daily Clarnce Flock, MD   0.4 mg at 09/03/20 0919  . torsemide (DEMADEX) tablet 40 mg  40 mg Oral Daily Clarnce Flock, MD   40 mg at 09/03/20 0919   . traZODone (DESYREL) tablet 25 mg  25 mg Oral QHS PRN Clarnce Flock, MD        VITAL SIGNS: BP 121/87 (BP Location: Right Arm)   Pulse 73   Temp (!) 97.5 F (36.4 C) (Oral)   Resp 16   Ht 6\' 1"  (1.854 m)   Wt 137 lb 5.6 oz (62.3 kg)   SpO2 99%   BMI 18.12 kg/m  Filed Weights   09/01/20 2000  Weight: 137 lb 5.6 oz (62.3 kg)    Estimated body mass index is 18.12 kg/m as calculated from the following:   Height as of this encounter: 6\' 1"  (1.854 m).   Weight as of this encounter: 137 lb 5.6 oz (62.3 kg).  LABS: CBC:    Component Value Date/Time   WBC 8.1 09/01/2020 1049   HGB 14.3 09/01/2020 1049   HGB 13.6 08/18/2011 1054   HCT 43.0 09/01/2020 1049   PLT 133 (L) 09/01/2020 1049   MCV 98.2 09/01/2020 1049   NEUTROABS 6.8 08/30/2020 1827   LYMPHSABS 1.1 08/30/2020 1827   MONOABS 0.8 08/30/2020 1827   EOSABS 0.1 08/30/2020 1827   BASOSABS 0.1 08/30/2020 1827   Comprehensive Metabolic Panel:    Component Value Date/Time   NA 134 (L) 09/01/2020 1049   NA 141 08/18/2011 1054   K 3.6 09/01/2020 1049   K 4.5 08/18/2011 1054   CL 93 (L) 09/01/2020 1049   CL 108 (H) 08/18/2011 1054   CO2 27 09/01/2020 1049   CO2 23 08/18/2011 1054   BUN 36 (H) 09/01/2020 1049   BUN 25 (H) 08/18/2011 1054   CREATININE 7.73 (H) 09/01/2020 1049   CREATININE 2.10 (H) 08/18/2011 1054   GLUCOSE 123 (H) 09/01/2020 1049   GLUCOSE 119 (H) 08/18/2011 1054   CALCIUM 9.6 09/01/2020 1049   CALCIUM 9.0 08/18/2011 1054   AST 19 08/30/2020 1827   ALT 13 08/30/2020 1827   ALKPHOS 36 (L)  08/30/2020 1827   BILITOT 1.2 08/30/2020 1827   PROT 6.9 08/30/2020 1827   ALBUMIN 3.5 08/30/2020 1827    RADIOGRAPHIC STUDIES: DG Chest 2 View  Result Date: 09/01/2020 CLINICAL DATA:  Cough with weakness 1 week. EXAM: CHEST - 2 VIEW COMPARISON:  08/30/2020 and 06/05/2020 as well as CT chest 08/30/2020 FINDINGS: Lungs are adequately inflated with slight worsening hazy density over the left  base/retrocardiac region with blunting of the left costophrenic angle as findings may be due to effusion/atelectasis versus infection. Subtle hazy prominence of the perihilar vessels unchanged. Stable cardiomegaly. Remainder of the exam is unchanged. IMPRESSION: 1. Slight worsening hazy density over the left base/retrocardiac region which may be due to effusion/atelectasis versus infection. 2. Stable cardiomegaly with suggestion of minimal vascular congestion. Electronically Signed   By: Marin Olp M.D.   On: 09/01/2020 12:31   DG Chest 2 View  Result Date: 08/30/2020 CLINICAL DATA:  Cough and weakness EXAM: CHEST - 2 VIEW COMPARISON:  June 05, 2020 FINDINGS: There is airspace opacity in portions of each mid lung and left lower lobe with left pleural effusion. There is cardiomegaly with pulmonary vascularity within normal limits. No adenopathy. No bone lesions. IMPRESSION: Airspace opacity consistent with pneumonia in the left lower lung region as well as in each mid lung region. Check of COVID-19 status may be prudent in this circumstance. Left pleural effusion noted.  Stable cardiomegaly. Electronically Signed   By: Lowella Grip III M.D.   On: 08/30/2020 18:02   CT Chest Wo Contrast  Result Date: 08/30/2020 CLINICAL DATA:  Pneumonia, effusion or abscess suspected, xray done Generalized weakness for 2 weeks. Radiologic records demonstrates history of lung cancer. EXAM: CT CHEST WITHOUT CONTRAST TECHNIQUE: Multidetector CT imaging of the chest was performed following the standard protocol without IV contrast. COMPARISON:  Radiograph earlier today. Staging chest CT 8 days ago 08/21/2020 FINDINGS: Cardiovascular: Aortic atherosclerosis and tortuosity. Focal outpouching of the left lateral wall of thoracic aorta on prior exam is grossly stable, but not well assessed on the current exam in the absence of IV contrast. No periaortic stranding. Multi chamber cardiomegaly. Coronary artery calcifications.  Small pericardial effusion is similar Mediastinum/Nodes: Patient had recent staging CT with IV contrast the. Right paratracheal lymph node measuring right lower paratracheal node measuring 2.3 cm, series 2, image 50, unchanged allowing for differences in technique and caliper placement. Subcarinal node measures 2.1 cm, series 2, image 71, previously 1.9 cm. Additional smaller lymph nodes are unchanged from prior exam. Hilar adenopathy is not well assessed on this noncontrast exam. Left epicardial node measures 1.2 cm, series 2, image 139, previously 1.1 cm. No obvious no adenopathy in the interim. Decompressed esophagus. Lungs/Pleura: Partially loculated left pleural effusion at the lung base this is not significantly changed in size. There is left lower lobe bronchial thickening. No definite acute airspace disease. There is mild scarring abutting the inter lobar fissure on the left which appears similar to prior exam. Spiculated left upper lobe pulmonary nodule measures 2.5 x 2 cm, series 3, image 27, unchanged. Spiculations extend to the pleural surface anterior laterally. Background emphysema. Patchy ground-glass opacity in the perifissural right upper lobe, series 3, image 66, with adjacent fissural thickening, progressed/new. Additional a ground-glass opacity in the perihilar right lower lobe. Slight basilar septal thickening. There is a trace right pleural effusion and seen on prior exam. Upper Abdomen: Bilateral renal parenchymal atrophy. Diverticulosis of the included colon. No acute upper abdominal findings. Musculoskeletal: There are no acute  or suspicious osseous abnormalities. IMPRESSION: 1. Patchy ground-glass opacity in the perifissural right upper and to a lesser extent right lower lobe. Differential considerations include pneumonitis including COVID-19 versus pulmonary edema. Small right pleural effusion is new from CT 1 week ago. 2. Unchanged partially loculated left pleural effusion at the lung  base. 3. Unchanged spiculated left upper lobe pulmonary nodule. Unchanged mediastinal adenopathy. Patient recently underwent staging CT. 4. Cardiomegaly with coronary artery calcifications. Small pericardial effusion is unchanged. Aortic Atherosclerosis (ICD10-I70.0) and Emphysema (ICD10-J43.9). Electronically Signed   By: Keith Rake M.D.   On: 08/30/2020 20:01   CT CHEST ABDOMEN PELVIS W CONTRAST  Result Date: 08/22/2020 CLINICAL DATA:  Restaging of left lung adenocarcinoma with malignant pleural effusion. Immunotherapy treatment with Keytruda. EXAM: CT CHEST, ABDOMEN, AND PELVIS WITH CONTRAST TECHNIQUE: Multidetector CT imaging of the chest, abdomen and pelvis was performed following the standard protocol during bolus administration of intravenous contrast. CONTRAST:  149mL OMNIPAQUE IOHEXOL 300 MG/ML  SOLN COMPARISON:  Multiple exams, including 04/30/2020 FINDINGS: CT CHEST FINDINGS Cardiovascular: Coronary, aortic arch, and branch vessel atherosclerotic vascular disease. Mild cardiomegaly. Small pericardial effusion. 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 Focal outpouching of the left lateral wall of the thoracic aorta on image 26 series 2 suggesting a chronic small saccular aneurysm. Chronic web like filling defect / irregularity in the left lower lobe pulmonary artery on image 86 series 5, no change from prior, likely residuum from remote pulmonary embolus. Mediastinum/Nodes: Right paratracheal lymph node 2.4 cm in short axis on image 21 series 2, formerly 1.5 cm. Right paratracheal node 0.8 cm in short axis on image 11 series 2, previously 0.4 cm. Subcarinal node 1.9 cm in short axis on image 30 series 2, formerly 1.3 cm. Left infrahilar node 1.2 cm in short axis on image 29 series 2, formerly 0.7 cm. Left anterior pericardial lymph node 1.1 cm in short axis on image 54 series 2, previously 1.5 cm. Lungs/Pleura: Small  left pleural effusion with suspected associated mild enhancement. Spiculated left upper lobe pulmonary nodule 2.5 by 2.0 cm on image 24 series 4, previously 2.3 by 1.8 cm when measured in a similar fashion. Centrilobular and paraseptal emphysema. Stable scarring along the minor fissure. Stable scarring in the posterior basal segment right lower lobe. Calcified granuloma posteriorly in the right upper lobe. Increased bilobed pleural nodularity anteriorly along the left upper lobe measuring 0.8 by 0.3 cm on image 40 series 4. Musculoskeletal: Dextroconvex thoracic scoliosis. CT ABDOMEN PELVIS FINDINGS Hepatobiliary: Unremarkable Pancreas: Unremarkable Spleen: Unremarkable Adrenals/Urinary Tract: Indistinct fullness of both adrenal glands partially blurred by motion artifact. Bilateral renal atrophy. Left mid kidney cyst. Thickened right posterior urinary bladder wall, no change from prior, sessile tumor not excluded. Stomach/Bowel: Descending and sigmoid colon diverticulosis. A margin of small bowel extends into a small direct right inguinal hernia without complicating feature. Vascular/Lymphatic: Aortoiliac atherosclerotic vascular disease. Fusiform infrarenal abdominal aortic aneurysm 3.4 cm in diameter, previously measured at 3.3 cm. Right gastric lymph node 1.1 cm in short axis on image 61 series 2, formerly not well seen. Scattered upper normal sized left periaortic lymph nodes. Reproductive: Brachytherapy seed implants in the prostate gland. As before, gas density from the anorectal region extends further anteriorly into the vicinity of the prostate that would otherwise be expected. Other: No supplemental non-categorized findings. Musculoskeletal: There is a suggestion of minimal bilateral chronic hip avascular necrosis with sclerosis anteriorly in both femoral heads.  Lumbar spondylosis and degenerative disc disease causing bilateral foraminal impingement at L5-S1. IMPRESSION: 1. Enlarging left upper lobe nodule  and worsening adenopathy in the chest and upper abdomen, compatible with progressive malignancy. 2. Infrarenal abdominal aortic aneurysm 3.4 cm in diameter. Recommend follow-up ultrasound every 3 years. This recommendation follows ACR consensus guidelines: White Paper of the ACR Incidental Findings Committee II on Vascular Findings. J Am Coll Radiol 2013; 10:789-794. 3. Considerable atherosclerosis with chronic stable findings in the chest including a small focal dissection at the origin of the left common carotid artery, and an outpouching of the left lateral side of the descending thoracic aorta potentially from saccular aneurysm or chronic pseudoaneurysm. There is also a web like residuum from prior remote pulmonary embolus in the left lower lobe. No acute pulmonary embolus. 4. Complex small left pleural effusion, likely malignant. 5. Thickened right posterior urinary bladder wall, stable, sessile tumor not excluded. 6. Stable gas density along the anterior anorectal margin extending into the vicinity the prostate gland, significance uncertain. Brachytherapy seeds are present in the prostate gland. 7. Other imaging findings of potential clinical significance: Aortic Atherosclerosis (ICD10-I70.0). Coronary atherosclerosis. Mild cardiomegaly. Emphysema (ICD10-J43.9). Descending and sigmoid colon diverticulosis. Chronic bilateral hip avascular necrosis (mild). Bilateral foraminal impingement at L5-S1. Small direct right inguinal hernia appears stable and contains a small loop of small bowel without complicating feature. Electronically Signed   By: Van Clines M.D.   On: 08/22/2020 09:49    PERFORMANCE STATUS (ECOG) : 2 - Symptomatic, <50% confined to bed  Review of Systems Unless otherwise noted, a complete review of systems is negative.  Physical Exam General: NAD Pulmonary: Unlabored Extremities: no edema, no joint deformities Skin: no rashes Neurological: Weakness but otherwise  nonfocal  IMPRESSION: Patient known to me from the clinic.  Currently, he says he is feeling better but remains overall weak.  He denies distressing symptoms at present.  Patient has been seen in consultation by medical oncology with plan to rotate chemotherapy.  Patient was able to relate to understanding of this plan and verbalized agreement.  His goals are aligned with continued cancer treatment.  Patient has had progressive weakness over the past couple of weeks.  He has been evaluated by PT with recommendation for SNF.  I would suggest that patient be followed by palliative care at SNF following discharge from hospital.  Patient has no ACP documents.  He says that he would want his son to be his primary decision-maker if needed.  Will consult chaplain to provide him with ACP documents  We discussed CODE STATUS.  Says that he would want an attempt made despite my explanation such efforts would likely prove futile in the setting of an advanced and incurable cancer.  PLAN: -Continue current scope of treatment -Full code -Chaplain to provide with ACP documents -Probable dispo: SNF with palliative care following -I wiil also be happy to follow patient in the clinic   Time Total: 60 minutes  Visit consisted of counseling and education dealing with the complex and emotionally intense issues of symptom management and palliative care in the setting of serious and potentially life-threatening illness.Greater than 50%  of this time was spent counseling and coordinating care related to the above assessment and plan.  Signed by: [name], PhD, NP-C

## 2020-09-03 NOTE — Progress Notes (Signed)
[facility], [place] 09/03/20  Subjective:   LOS: 2 Patient presented to the ER for generalized weakness.  He states that he is now not able to walk as good.  He has been missing toes on his right foot.  He has some lower extremity edema.  No shortness of breath.  Appetite is low.  No nausea or vomiting.  No diarrhea.  No fever.  Gets dialysis from the left arm AVF  Last dialysis was on Thursday.  Patient seen resting in bed Alert and oriented Eating breakfast Denies nausea Denies shortness of breath Says he sat in chair yesterday  Objective:  Vital signs in last 24 hours:  Temp:  [97.6 F (36.4 C)-98.8 F (37.1 C)] 97.8 F (36.6 C) (04/04 0736) Pulse Rate:  [72-81] 72 (04/04 0736) Resp:  [17-20] 18 (04/04 0736) BP: (109-139)/(75-92) 129/87 (04/04 0736) SpO2:  [93 %-97 %] 93 % (04/04 0736)  Weight change:  Filed Weights   09/01/20 2000  Weight: 62.3 kg    Intake/Output:    Intake/Output Summary (Last 24 hours) at 09/03/2020 0943 Last data filed at 09/02/2020 1412 Gross per 24 hour  Intake 120 ml  Output --  Net 120 ml    Physical Exam: General:  No acute distress, laying in the bed  HEENT  anicteric, moist oral mucous membrane  Pulm/lungs  normal breathing effort, clear, room air  CVS/Heart  regular rhythm, no rub or gallop  Abdomen:   Soft, nontender  Extremities:  + peripheral edema  Neurologic:  Alert, oriented, able to follow commands  Skin:  No acute rashes  Left arm aneursmal AVF     Basic Metabolic Panel:  Recent Labs  Lab 08/30/20 1827 09/01/20 1049 09/01/20 2026  NA 136 134*  --   K 3.6 3.6  --   CL 94* 93*  --   CO2 29 27  --   GLUCOSE 89 123*  --   BUN 20 36*  --   CREATININE 5.32* 7.73*  --   CALCIUM 9.0 9.6  --   PHOS  --   --  3.3     CBC: Recent Labs  Lab 08/30/20 1827 09/01/20 1049  WBC 8.9 8.1  NEUTROABS 6.8  --   HGB 13.8 14.3  HCT 41.3 43.0  MCV 96.5 98.2  PLT 138* 133*      Lab Results   Component Value Date   HEPBSAG NON REACTIVE 09/01/2020   HEPBSAB Reactive 03/22/2018   HEPBIGM Negative 03/22/2018      Microbiology:  Recent Results (from the past 240 hour(s))  Resp Panel by RT-PCR (Flu A&B, Covid) Nasopharyngeal Swab     Status: None   Collection Time: 09/01/20  1:25 PM   Specimen: Nasopharyngeal Swab; Nasopharyngeal(NP) swabs in vial transport medium  Result Value Ref Range Status   SARS Coronavirus 2 by RT PCR NEGATIVE NEGATIVE Final    Comment: (NOTE) SARS-CoV-2 target nucleic acids are NOT DETECTED.  The SARS-CoV-2 RNA is generally detectable in upper respiratory specimens during the acute phase of infection. The lowest concentration of SARS-CoV-2 viral copies this assay can detect is 138 copies/mL. A negative result does not preclude SARS-Cov-2 infection and should not be used as the sole basis for treatment or other patient management decisions. A negative result may occur with  improper specimen collection/handling, submission of specimen other than nasopharyngeal swab, presence of viral mutation(s) within the areas targeted by this assay, and inadequate number of viral copies(<138 copies/mL).  A negative result must be combined with clinical observations, patient history, and epidemiological information. The expected result is Negative.  Fact Sheet for Patients:  EntrepreneurPulse.com.au  Fact Sheet for Healthcare Providers:  IncredibleEmployment.be  This test is no t yet approved or cleared by the [place] FDA and  has been authorized for detection and/or diagnosis of SARS-CoV-2 by FDA under an Emergency Use Authorization (EUA). This EUA will remain  in effect (meaning this test can be used) for the duration of the COVID-19 declaration under Section 564(b)(1) of the Act, 21 U.S.C.section 360bbb-3(b)(1), unless the authorization is terminated  or revoked sooner.       Influenza A by PCR NEGATIVE  NEGATIVE Final   Influenza B by PCR NEGATIVE NEGATIVE Final    Comment: (NOTE) The Xpert Xpress SARS-CoV-2/FLU/RSV plus assay is intended as an aid in the diagnosis of influenza from Nasopharyngeal swab specimens and should not be used as a sole basis for treatment. Nasal washings and aspirates are unacceptable for Xpert Xpress SARS-CoV-2/FLU/RSV testing.  Fact Sheet for Patients: EntrepreneurPulse.com.au  Fact Sheet for Healthcare Providers: IncredibleEmployment.be  This test is not yet approved or cleared by the [place] FDA and has been authorized for detection and/or diagnosis of SARS-CoV-2 by FDA under an Emergency Use Authorization (EUA). This EUA will remain in effect (meaning this test can be used) for the duration of the COVID-19 declaration under Section 564(b)(1) of the Act, 21 U.S.C. section 360bbb-3(b)(1), unless the authorization is terminated or revoked.  Performed at [facility], [address] [place], [place] [address]   MRSA PCR Screening     Status: None   Collection Time: 09/02/20  6:03 AM   Specimen: Nasopharyngeal  Result Value Ref Range Status   MRSA by PCR NEGATIVE NEGATIVE Final    Comment:        The GeneXpert MRSA Assay (FDA approved for NASAL specimens only), is one component of a comprehensive MRSA colonization surveillance program. It is not intended to diagnose MRSA infection nor to guide or monitor treatment for MRSA infections. Performed at [facility], [address] [place], [place] [address]     Coagulation Studies: No results for input(s): LABPROT, INR in the last 72 hours.  Urinalysis: No results for input(s): COLORURINE, LABSPEC, PHURINE, GLUCOSEU, HGBUR, BILIRUBINUR, KETONESUR, PROTEINUR, UROBILINOGEN, NITRITE, LEUKOCYTESUR in the last 72 hours.  Invalid input(s): APPERANCEUR    Imaging: DG Chest 2 View  Result Date: 09/01/2020 CLINICAL DATA:  Cough with  weakness 1 week. EXAM: CHEST - 2 VIEW COMPARISON:  08/30/2020 and 06/05/2020 as well as CT chest 08/30/2020 FINDINGS: Lungs are adequately inflated with slight worsening hazy density over the left base/retrocardiac region with blunting of the left costophrenic angle as findings may be due to effusion/atelectasis versus infection. Subtle hazy prominence of the perihilar vessels unchanged. Stable cardiomegaly. Remainder of the exam is unchanged. IMPRESSION: 1. Slight worsening hazy density over the left base/retrocardiac region which may be due to effusion/atelectasis versus infection. 2. Stable cardiomegaly with suggestion of minimal vascular congestion. Electronically Signed   By: Marin Olp M.D.   On: 09/01/2020 12:31     Medications:    Scheduled Meds: Continuous Infusions: PRN Meds:.    Assessment/ Plan:  75 y.o. male with  was admitted on 09/01/2020 for  Active Problems:   Type II diabetes mellitus with renal manifestations ([place])   ESRD on dialysis ([place])   Gangrene of toe of right foot ([place])   Atherosclerosis of native arteries  of the extremities with gangrene ([place])   HLD (hyperlipidemia)   Stroke ([facility])   Anxiety and depression   Tobacco abuse   Chronic diastolic CHF (congestive heart failure) (H[facility])   Type 2 diabetes mellitus with hyperlipidemia (H[facility])   Primary adenocarcinoma of left lung (H[facility])   Malignant pleural effusion   Physical deconditioning   Hypervolemia   Weakness   Hypothyroidism  Physical deconditioning [R53.81] x 2 weeks   [facility] TuThSa-2  wt 66.5 kg  #. ESRD -Received dialysis on Saturday - Next treatment scheduled for Tuesday  #. Anemia of CKD  Lab Results  Component Value Date   HGB 14.3 09/01/2020   Avoid Epogen as patient has known malignancy Given chemotherapy and is followed by Dr. Tasia Catchings at the cancer center  #. Secondary hyperparathyroidism of renal origin N 25.81      Component Value Date/Time   PTH 226 (H) 03/25/2018 1557    Lab Results  Component Value Date   PHOS 3.3 09/01/2020  Continue Calcium acetate with meals Phosphorus within range   #.  Generalized weakness Plan as per hospitalist PT recommending rehab Patient agreeable   LOS: [place] 4/4/20229:43 AM  [facility] [place], [place]

## 2020-09-03 NOTE — Progress Notes (Signed)
   09/03/20 1320  Clinical Encounter Type  Visited With Patient  Visit Type Initial;Spiritual support;Social support  Referral From Nurse  Consult/Referral To Chaplain   Chaplain did AD education with PT as requested. PT will have his son help him complete it, and then contact Chaplain at a later time.

## 2020-09-03 NOTE — Progress Notes (Addendum)
Initial Nutrition Assessment  DOCUMENTATION CODES:   Underweight,Severe malnutrition in context of chronic illness  INTERVENTION:   -Continue Nepro Shake po BID, each supplement provides 425 kcal and 19 grams protein -Renal MVI daily -D/c MVI with minerals daily -30 ml Prosource Plus BID, each supplement provides 100 kcals and 15 grams protein -Double protein portions with meals  NUTRITION DIAGNOSIS:   Severe Malnutrition related to chronic illness (ESRD on HD, lt lung cancer) as evidenced by moderate fat depletion,severe fat depletion,moderate muscle depletion,severe muscle depletion,percent weight loss.  GOAL:   Patient will meet greater than or equal to 90% of their needs  MONITOR:   PO intake,Supplement acceptance,Labs,Weight trends,Skin,I & O's  REASON FOR ASSESSMENT:   Consult Assessment of nutrition requirement/status  ASSESSMENT:   Jose [name] is a 75 y.o. male with history of ESRD on dialysis, type 2 diabetes, significant PVD, CVA, hyperlipidemia, hypertension, chronic diastolic CHF, and adenocarcinoma of the left lung currently undergoing palliative chemotherapy, who presents with physical deconditioning and likely volume overload.  Pt admitted with physical deconditioning and volume overload.   Reviewed I/O's: +120 ml x 24 hours and -1.4 L since admission  Pt very sleepy at time of visit. He aroused to voice and touch, however, unable to stay awake enough to converse with this RD. Noted he consumed about 25% of Nepro supplement at bedside.  Per meal documentation records, PO 90%.   Reviewed wt hx; pt has experienced a 9.6% wt loss over the past month, which is significant for time frame.   Medications reviewed and include phoslo and demadex.   Palliative care consult pending for goals of care discussions.   Lab Results  Component Value Date   HGBA1C 6.3 (H) 09/01/2020   PTA DM medications are 5 mg glipizide daily.   Labs reviewed: CBGS: 102-210  (inpatient orders for glycemic control are 0-6 units insulin aspart TID with meals).   NUTRITION - FOCUSED PHYSICAL EXAM:  Flowsheet Row Most Recent Value  Orbital Region Severe depletion  Upper Arm Region Moderate depletion  Thoracic and Lumbar Region Moderate depletion  Buccal Region Severe depletion  Temple Region Severe depletion  Clavicle Bone Region Severe depletion  Clavicle and Acromion Bone Region Severe depletion  Scapular Bone Region Severe depletion  Dorsal Hand Severe depletion  Patellar Region Moderate depletion  Anterior Thigh Region Moderate depletion  Posterior Calf Region Moderate depletion  Edema (RD Assessment) Mild  Hair Reviewed  Eyes Reviewed  Mouth Reviewed  Skin Reviewed  Nails Reviewed       Diet Order:   Diet Order            Diet renal/carb modified with fluid restriction Diet-HS Snack? Nothing; Fluid restriction: 1200 mL Fluid; Room service appropriate? Yes; Fluid consistency: Thin  Diet effective now                 EDUCATION NEEDS:   No education needs have been identified at this time  Skin:  Skin Assessment: Reviewed RN Assessment  Last BM:  09/02/20  Height:   Ht Readings from Last 1 Encounters:  09/01/20 6\' 1"  (1.854 m)    Weight:   Wt Readings from Last 1 Encounters:  09/01/20 62.3 kg    Ideal Body Weight:  83.6 kg  BMI:  Body mass index is 18.12 kg/m.  Estimated Nutritional Needs:   Kcal:  0865-7846  Protein:  125-150 grams  Fluid:  1000 ml + UOP    Loistine Chance, RD, LDN, CDCES Registered  Dietitian II Certified Diabetes Care and Education Specialist Please refer to [facility] for RD and/or RD on-call/weekend/after hours pager

## 2020-09-03 NOTE — Progress Notes (Signed)
Established hemodialysis patient known at [facility] TTS 12:00. Uses [facility] for transportation. Please call me with any placement related questions.  [name] Dialysis Coordinator [phone]

## 2020-09-03 NOTE — Consult Note (Signed)
Hematology/Oncology Consult note [facility] Telephone:([phone] [phone]  Patient Care Team: [name], MD as PCP - General (Family Medicine) Telford Nab, RN as Oncology Nurse Navigator Earlie Server, MD as Consulting Physician (Hematology and Oncology)   Name of the patient: Jose [name]  [id number]  07/08/1945   Date of visit: 09/03/20 REASON FOR COSULTATION:  Lung cancer, admitted due to weakness, volume overload History of presenting illness-  75 y.o. male with PMH listed at below who presents to ER for evaluation of weakness. Patient reports feeling progressively weaker for the past 2 weeks,.  I started him on Synthroid 50 MCG on 08/31/2020. Patient denies shortness of breath, chest pain.  He has also reported cough with thick mucus and I recommended him to start Z-Pak for empiric bronchitis treatment and Mucinex. Patient lives with his sister.  Patient told admitting doctor that he has not been taking his regular medication for the past 2 months and is not able to obtain new medications.  His nephew has told him away.  On admission, chest x-ray showed stable cardiomegaly, vascular congestion as well as worsening haziness at the left base.  Patient was admitted for volume overload and PT OT evaluation of deconditioning Patient has metastatic lung cancer and has progressed on single agent Keytruda.  There is plan for him to proceed with single agent chemotherapy next week if he is able to recover to baseline. Patient was seen at the bedside.  He reports slightly better.  Review of Systems  Constitutional: Positive for fatigue. Negative for appetite change, chills, fever and unexpected weight change.  HENT:   Negative for hearing loss and voice change.   Eyes: Negative for eye problems and icterus.  Respiratory: Positive for cough. Negative for chest tightness and shortness of breath.   Cardiovascular: Negative for chest pain and leg swelling.   Gastrointestinal: Negative for abdominal distention and abdominal pain.  Endocrine: Negative for hot flashes.  Genitourinary: Negative for difficulty urinating, dysuria and frequency.   Musculoskeletal: Negative for arthralgias.  Skin: Negative for itching and rash.  Neurological: Negative for light-headedness and numbness.  Hematological: Negative for adenopathy. Does not bruise/bleed easily.  Psychiatric/Behavioral: Negative for confusion.    Allergies  Allergen Reactions  . Penicillin G Other (See Comments)    Other reaction(s): Unknown DID THE REACTION INVOLVE: Swelling of the face/tongue/throat, SOB, or low BP? Unknown Sudden or severe rash/hives, skin peeling, or the inside of the mouth or nose? Unknown Did it require medical treatment? Unknown When did it last happen?unknown If all above answers are "NO", may proceed with cephalosporin use.     Patient Active Problem List   Diagnosis Date Noted  . Hypervolemia   . Weakness   . Hypothyroidism   . Physical deconditioning 09/01/2020  . Weight loss 07/11/2020  . Encounter for antineoplastic immunotherapy 02/15/2020  . Goals of care, counseling/discussion 12/23/2019  . 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 Malignant neoplasm of upper lobe of left lung ([place]) 12/23/2019  . Thickening of wall of gallbladder   . Primary adenocarcinoma of left lung ([place])   . Hypokalemia 10/30/2019  . Chronic diastolic CHF (congestive heart failure) ([place]) 10/30/2019  . Benign essential HTN 10/30/2019  . Malignant neoplasm of unknown origin ([place]) 10/30/2019  . Acute respiratory failure with hypoxia ([place]) 10/30/2019  . End-stage renal disease on hemodialysis ([place]) 10/30/2019  . Type 2 diabetes mellitus with hyperlipidemia ([place]) 10/30/2019  . SOB (shortness of breath) 10/29/2019  . Pulmonary edema  10/22/2019  . Diarrhea 10/22/2019  . Acute on chronic diastolic CHF (congestive heart failure) ([place]) 10/22/2019  . HLD (hyperlipidemia)   .  Stroke ([place])   . Anxiety and depression   . Tobacco abuse   . Pleural effusion on left   . Acute urinary retention   . Foot ulcer ([place]) 12/15/2018  . Ischemic foot 10/11/2018  . Atherosclerosis of native arteries of the extremities with gangrene ([place]) 07/09/2018  . Gangrene of toe of right foot ([place]) 06/13/2018  . Type II diabetes mellitus with renal manifestations ([place]) 04/16/2018  . Hypertension 04/16/2018  . ESRD on dialysis ([place]) 04/16/2018  . Malnutrition of moderate degree 03/27/2018  . Renal failure 03/21/2018     Past Medical History:  Diagnosis Date  . Chronic kidney disease   . Depression   . Diabetes mellitus without complication ([place])   . Glaucoma   . Glaucoma   . Gout   . Heart murmur   . HLD (hyperlipidemia)   . Hypertension   . Primary adenocarcinoma of left lung ([place])   . Prostate cancer ([place])   . Renal failure    dialysis t/t/s  . Renal insufficiency   . Stroke ([place])    tia x 2     Past Surgical History:  Procedure Laterality Date  . AMPUTATION TOE Bilateral 07/23/2018   Procedure: TOE MPJ RIGHT 3RD AND LEFT 2ND;  Surgeon: Samara Deist, DPM;  Location: [facility];  Service: Podiatry;  Laterality: Bilateral;  . APPENDECTOMY    . AV FISTULA PLACEMENT Left 06/09/2018   Procedure: ARTERIOVENOUS (AV) FISTULA CREATION ( BRACHIAL CEPHALIC);  Surgeon: Algernon Huxley, MD;  Location: [facility];  Service: Vascular;  Laterality: Left;  . CHOLECYSTECTOMY    . DIALYSIS/PERMA CATHETER INSERTION N/A 03/24/2018   Procedure: DIALYSIS/PERMA CATHETER INSERTION;  Surgeon: Katha Cabal, MD;  Location: [place] [facility];  Service: Cardiovascular;  Laterality: N/A;  . DIALYSIS/PERMA CATHETER REMOVAL N/A 09/06/2018   Procedure: DIALYSIS/PERMA CATHETER REMOVAL;  Surgeon: Algernon Huxley, MD;  Location: [place] [facility];  Service: Cardiovascular;  Laterality: N/A;  . INSERTION PROSTATE RADIATION SEED    . IR PERC PLEURAL DRAIN W/INDWELL CATH W/IMG GUIDE  11/03/2019  .  IRRIGATION AND DEBRIDEMENT FOOT Right 12/17/2018   Procedure: IRRIGATION AND DEBRIDEMENT FOOT;  Surgeon: Samara Deist, DPM;  Location: [facility];  Service: Podiatry;  Laterality: Right;  . LOWER EXTREMITY ANGIOGRAPHY Right 06/16/2018   Procedure: LOWER EXTREMITY ANGIOGRAPHY;  Surgeon: Algernon Huxley, MD;  Location: [place] [facility];  Service: Cardiovascular;  Laterality: Right;  . LOWER EXTREMITY ANGIOGRAPHY Left 08/30/2018   Procedure: LOWER EXTREMITY ANGIOGRAPHY;  Surgeon: Algernon Huxley, MD;  Location: [place] [facility];  Service: Cardiovascular;  Laterality: Left;  . LOWER EXTREMITY ANGIOGRAPHY Right 09/30/2018   Procedure: LOWER EXTREMITY ANGIOGRAPHY;  Surgeon: Algernon Huxley, MD;  Location: [place] [facility];  Service: Cardiovascular;  Laterality: Right;  . LOWER EXTREMITY ANGIOGRAPHY Left 10/07/2018   Procedure: LOWER EXTREMITY ANGIOGRAPHY;  Surgeon: Algernon Huxley, MD;  Location: [place] [facility];  Service: Cardiovascular;  Laterality: Left;  . LOWER EXTREMITY ANGIOGRAPHY Right 12/20/2018   Procedure: Lower Extremity Angiography;  Surgeon: Algernon Huxley, MD;  Location: [place] [facility];  Service: Cardiovascular;  Laterality: Right;  . TRANSMETATARSAL AMPUTATION Bilateral 10/13/2018   Procedure: 1.  Amputation right great toe MTPJ 2.  Amputation right second toe MTPJ 3.  Amputation right fourth toe MTPJ 4.  Amputation right  fifth toe MTPJ 5.  Excision distal second metatarsal head right foot 6.  Excision distal third metatarsal head right foot 7.  Amputation left third toe 8.  Incision and drainage infectionleft 2nd toe amputation site. ;  Surgeon: Samara Deist, DPM;  Location    Social History   Socioeconomic History  . Marital status: Widowed    Spouse name: Not on file  . Number of children: Not on file  . Years of education: Not on file  . Highest education level: Not on file  Occupational History    Employer: RETIRED  Tobacco Use  . Smoking status: Current Some Day Smoker     Packs/day: 0.10    Types: Cigarettes  . Smokeless tobacco: Never Used  . Tobacco comment: pt currently smokes about 3 cigarettes a day   Vaping Use  . Vaping Use: Never used  Substance and Sexual Activity  . Alcohol use: Not Currently    Alcohol/week: 0.0 standard drinks  . Drug use: Never  . Sexual activity: Not Currently  Other Topics Concern  . Not on file  Social History Narrative  . Not on file   Social Determinants of Health   Financial Resource Strain: Not on file  Food Insecurity: Not on file  Transportation Needs: Not on file  Physical Activity: Not on file  Stress: Not on file  Social Connections: Not on file  Intimate Partner Violence: Not on file     Family History  Problem Relation Age of Onset  . Varicose Veins Neg Hx      Current Facility-Administered Medications:  .  (feeding supplement) PROSource Plus liquid 30 mL, 30 mL, Oral, BID BM, Wieting, Richard, MD .  acetaminophen (TYLENOL) tablet 650 mg, 650 mg, Oral, Q6H PRN, Clarnce Flock, MD .  amLODipine (NORVASC) tablet 10 mg, 10 mg, Oral, Daily, Clarnce Flock, MD, 10 mg at 09/03/20 0919 .  aspirin EC tablet 81 mg, 81 mg, Oral, Daily, Clarnce Flock, MD, 81 mg at 09/03/20 0919 .  atorvastatin (LIPITOR) tablet 10 mg, 10 mg, Oral, QHS, Clarnce Flock, MD, 10 mg at 09/02/20 2036 .  calcium acetate (PHOSLO) capsule 1,334 mg, 1,334 mg, Oral, TID WC, Clarnce Flock, MD, 1,334 mg at 09/03/20 [phone] .  Chlorhexidine Gluconate Cloth 2 % PADS 6 each, 6 each, Topical, Q0600, Clarnce Flock, MD, 6 each at 09/03/20 0545 .  clopidogrel (PLAVIX) tablet 75 mg, 75 mg, Oral, Daily, Clarnce Flock, MD, 75 mg at 09/03/20 0919 .  doxycycline (VIBRA-TABS) tablet 100 mg, 100 mg, Oral, Q12H, Wieting, Richard, MD, 100 mg at 09/03/20 0919 .  escitalopram (LEXAPRO) tablet 10 mg, 10 mg, Oral, Daily, Clarnce Flock, MD, 10 mg at 09/03/20 0630 .  feeding supplement (NEPRO CARB STEADY) liquid 237 mL,  237 mL, Oral, BID BM, Clarnce Flock, MD, 237 mL at 09/03/20 0923 .  gabapentin (NEURONTIN) capsule 100 mg, 100 mg, Oral, TID, Clarnce Flock, MD, 100 mg at 09/03/20 0919 .  heparin injection 5,000 Units, 5,000 Units, Subcutaneous, Q8H, Loletha Grayer, MD, 5,000 Units at 09/03/20 0527 .  insulin aspart (novoLOG) injection 0-6 Units, 0-6 Units, Subcutaneous, TID WC, Clarnce Flock, MD, 2 Units at 09/02/20 1328 .  ipratropium-albuterol (DUONEB) 0.5-2.5 (3) MG/3ML nebulizer solution 3 mL, 3 mL, Nebulization, Q6H, Wieting, Richard, MD .  levothyroxine (SYNTHROID) tablet 50 mcg, 50 mcg, Oral, QAC breakfast, Clarnce Flock, MD, 50 mcg at 09/03/20 0527 .  methocarbamol (ROBAXIN) 500 mg  in dextrose 5 % 50 mL IVPB, 500 mg, Intravenous, Q6H PRN, Clarnce Flock, MD .  multivitamin (RENA-VIT) tablet 1 tablet, 1 tablet, Oral, QHS, Wieting, Richard, MD .  ondansetron (ZOFRAN) tablet 4 mg, 4 mg, Oral, Q6H PRN **OR** ondansetron (ZOFRAN) injection 4 mg, 4 mg, Intravenous, Q6H PRN, Clarnce Flock, MD .  oxyCODONE (Oxy IR/ROXICODONE) immediate release tablet 5 mg, 5 mg, Oral, Q4H PRN, Clarnce Flock, MD .  polyethylene glycol (MIRALAX / GLYCOLAX) packet 17 g, 17 g, Oral, Daily PRN, Clarnce Flock, MD .  tamsulosin [facility]) capsule 0.4 mg, 0.4 mg, Oral, Daily, Clarnce Flock, MD, 0.4 mg at 09/03/20 0919 .  torsemide (DEMADEX) tablet 40 mg, 40 mg, Oral, Daily, Clarnce Flock, MD, 40 mg at 09/03/20 0919 .  traZODone (DESYREL) tablet 25 mg, 25 mg, Oral, QHS PRN, Clarnce Flock, MD   Physical exam:  Vitals:   09/03/20 0016 09/03/20 0531 09/03/20 0736 09/03/20 1100  BP: (!) 138/91 (!) 139/92 129/87 121/87  Pulse: 77 77 72 73  Resp: 18 18 18 16   Temp: 98.4 F (36.9 C) 98.5 F (36.9 C) 97.8 F (36.6 C) (!) 97.5 F (36.4 C)  TempSrc: Oral   Oral  SpO2: 93% 94% 93% 99%  Weight:      Height:       Physical Exam Constitutional:      General: He is not in acute  distress.    Appearance: He is not diaphoretic.  HENT:     Head: Normocephalic and atraumatic.     Nose: Nose normal.     Mouth/Throat:     Pharynx: No oropharyngeal exudate.  Eyes:     General: No scleral icterus.    Pupils: Pupils are equal, round, and reactive to light.  Cardiovascular:     Rate and Rhythm: Normal rate and regular rhythm.     Heart sounds: No murmur heard.   Pulmonary:     Effort: Pulmonary effort is normal. No respiratory distress.     Breath sounds: No rales.     Comments: Decreased breath sound on the left Chest:     Chest wall: No tenderness.  Abdominal:     General: There is no distension.     Palpations: Abdomen is soft.     Tenderness: There is no abdominal tenderness.  Musculoskeletal:        General: Normal range of motion.     Cervical back: Normal range of motion and neck supple.  Skin:    General: Skin is warm and dry.     Findings: No erythema.  Neurological:     Mental Status: He is alert. Mental status is at baseline.     Cranial Nerves: No cranial nerve deficit.     Motor: No abnormal muscle tone.     Coordination: Coordination normal.  Psychiatric:        Mood and Affect: Affect normal.         CMP Latest Ref Rng & Units 09/01/2020  Glucose 70 - 99 mg/dL 123(H)  BUN 8 - 23 mg/dL 36(H)  Creatinine 0.61 - 1.24 mg/dL 7.73(H)  Sodium 135 - 145 mmol/L 134(L)  Potassium 3.5 - 5.1 mmol/L 3.6  Chloride 98 - 111 mmol/L 93(L)  CO2 22 - 32 mmol/L 27  Calcium 8.9 - 10.3 mg/dL 9.6  Total Protein 6.5 - 8.1 g/dL -  Total Bilirubin 0.3 - 1.2 mg/dL -  Alkaline Phos 38 - 126 U/L -  AST 15 - 41 U/L -  ALT 0 - 44 U/L -   CBC Latest Ref Rng & Units 09/01/2020  WBC 4.0 - 10.5 K/uL 8.1  Hemoglobin 13.0 - 17.0 g/dL 14.3  Hematocrit 39.0 - 52.0 % 43.0  Platelets 150 - 400 K/uL 133(L)    RADIOGRAPHIC STUDIES: I have personally reviewed the radiological images as listed and agreed with the findings in the report. DG Chest 2 View  Result  Date: 09/01/2020 CLINICAL DATA:  Cough with weakness 1 week. EXAM: CHEST - 2 VIEW COMPARISON:  08/30/2020 and 06/05/2020 as well as CT chest 08/30/2020 FINDINGS: Lungs are adequately inflated with slight worsening hazy density over the left base/retrocardiac region with blunting of the left costophrenic angle as findings may be due to effusion/atelectasis versus infection. Subtle hazy prominence of the perihilar vessels unchanged. Stable cardiomegaly. Remainder of the exam is unchanged. IMPRESSION: 1. Slight worsening hazy density over the left base/retrocardiac region which may be due to effusion/atelectasis versus infection. 2. Stable cardiomegaly with suggestion of minimal vascular congestion. Electronically Signed   By: Marin Olp M.D.   On: 09/01/2020 12:31   DG Chest 2 View  Result Date: 08/30/2020 CLINICAL DATA:  Cough and weakness EXAM: CHEST - 2 VIEW COMPARISON:  June 05, 2020 FINDINGS: There is airspace opacity in portions of each mid lung and left lower lobe with left pleural effusion. There is cardiomegaly with pulmonary vascularity within normal limits. No adenopathy. No bone lesions. IMPRESSION: Airspace opacity consistent with pneumonia in the left lower lung region as well as in each mid lung region. Check of COVID-19 status may be prudent in this circumstance. Left pleural effusion noted.  Stable cardiomegaly. Electronically Signed   By: Lowella Grip III M.D.   On: 08/30/2020 18:02   CT Chest Wo Contrast  Result Date: 08/30/2020 CLINICAL DATA:  Pneumonia, effusion or abscess suspected, xray done Generalized weakness for 2 weeks. Radiologic records demonstrates history of lung cancer. EXAM: CT CHEST WITHOUT CONTRAST TECHNIQUE: Multidetector CT imaging of the chest was performed following the standard protocol without IV contrast. COMPARISON:  Radiograph earlier today. Staging chest CT 8 days ago 08/21/2020 FINDINGS: Cardiovascular: Aortic atherosclerosis and tortuosity. Focal  outpouching of the left lateral wall of thoracic aorta on prior exam is grossly stable, but not well assessed on the current exam in the absence of IV contrast. No periaortic stranding. Multi chamber cardiomegaly. Coronary artery calcifications. Small pericardial effusion is similar Mediastinum/Nodes: Patient had recent staging CT with IV contrast the. Right paratracheal lymph node measuring right lower paratracheal node measuring 2.3 cm, series 2, image 50, unchanged allowing for differences in technique and caliper placement. Subcarinal node measures 2.1 cm, series 2, image 71, previously 1.9 cm. Additional smaller lymph nodes are unchanged from prior exam. Hilar adenopathy is not well assessed on this noncontrast exam. Left epicardial node measures 1.2 cm, series 2, image 139, previously 1.1 cm. No obvious no adenopathy in the interim. Decompressed esophagus. Lungs/Pleura: Partially loculated left pleural effusion at the lung base this is not significantly changed in size. There is left lower lobe bronchial thickening. No definite acute airspace disease. There is mild scarring abutting the inter lobar fissure on the left which appears similar to prior exam. Spiculated left upper lobe pulmonary nodule measures 2.5 x 2 cm, series 3, image 27, unchanged. Spiculations extend to the pleural surface anterior laterally. Background emphysema. Patchy ground-glass opacity in the perifissural right upper lobe, series 3, image 66, with adjacent fissural thickening, progressed/new.  Additional a ground-glass opacity in the perihilar right lower lobe. Slight basilar septal thickening. There is a trace right pleural effusion and seen on prior exam. Upper Abdomen: Bilateral renal parenchymal atrophy. Diverticulosis of the included colon. No acute upper abdominal findings. Musculoskeletal: There are no acute or suspicious osseous abnormalities. IMPRESSION: 1. Patchy ground-glass opacity in the perifissural right upper and to a  lesser extent right lower lobe. Differential considerations include pneumonitis including COVID-19 versus pulmonary edema. Small right pleural effusion is new from CT 1 week ago. 2. Unchanged partially loculated left pleural effusion at the lung base. 3. Unchanged spiculated left upper lobe pulmonary nodule. Unchanged mediastinal adenopathy. Patient recently underwent staging CT. 4. Cardiomegaly with coronary artery calcifications. Small pericardial effusion is unchanged. Aortic Atherosclerosis (ICD10-I70.0) and Emphysema (ICD10-J43.9). Electronically Signed   By: Keith Rake M.D.   On: 08/30/2020 20:01   CT CHEST ABDOMEN PELVIS W CONTRAST  Result Date: 08/22/2020 CLINICAL DATA:  Restaging of left lung adenocarcinoma with malignant pleural effusion. Immunotherapy treatment with Keytruda. EXAM: CT CHEST, ABDOMEN, AND PELVIS WITH CONTRAST TECHNIQUE: Multidetector CT imaging of the chest, abdomen and pelvis was performed following the standard protocol during bolus administration of intravenous contrast. CONTRAST:  149mL OMNIPAQUE IOHEXOL 300 MG/ML  SOLN COMPARISON:  Multiple exams, including 04/30/2020 FINDINGS: CT CHEST FINDINGS Cardiovascular: Coronary, aortic arch, and branch vessel atherosclerotic vascular disease. Mild cardiomegaly. Small pericardial effusion. 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 Focal outpouching of the left lateral wall of the thoracic aorta on image 26 series 2 suggesting a chronic small saccular aneurysm. Chronic web like filling defect / irregularity in the left lower lobe pulmonary artery on image 86 series 5, no change from prior, likely residuum from remote pulmonary embolus. Mediastinum/Nodes: Right paratracheal lymph node 2.4 cm in short axis on image 21 series 2, formerly 1.5 cm. Right paratracheal node 0.8 cm in short axis on image 11 series 2, previously 0.4 cm. Subcarinal node 1.9 cm in short  axis on image 30 series 2, formerly 1.3 cm. Left infrahilar node 1.2 cm in short axis on image 29 series 2, formerly 0.7 cm. Left anterior pericardial lymph node 1.1 cm in short axis on image 54 series 2, previously 1.5 cm. Lungs/Pleura: Small left pleural effusion with suspected associated mild enhancement. Spiculated left upper lobe pulmonary nodule 2.5 by 2.0 cm on image 24 series 4, previously 2.3 by 1.8 cm when measured in a similar fashion. Centrilobular and paraseptal emphysema. Stable scarring along the minor fissure. Stable scarring in the posterior basal segment right lower lobe. Calcified granuloma posteriorly in the right upper lobe. Increased bilobed pleural nodularity anteriorly along the left upper lobe measuring 0.8 by 0.3 cm on image 40 series 4. Musculoskeletal: Dextroconvex thoracic scoliosis. CT ABDOMEN PELVIS FINDINGS Hepatobiliary: Unremarkable Pancreas: Unremarkable Spleen: Unremarkable Adrenals/Urinary Tract: Indistinct fullness of both adrenal glands partially blurred by motion artifact. Bilateral renal atrophy. Left mid kidney cyst. Thickened right posterior urinary bladder wall, no change from prior, sessile tumor not excluded. Stomach/Bowel: Descending and sigmoid colon diverticulosis. A margin of small bowel extends into a small direct right inguinal hernia without complicating feature. Vascular/Lymphatic: Aortoiliac atherosclerotic vascular disease. Fusiform infrarenal abdominal aortic aneurysm 3.4 cm in diameter, previously measured at 3.3 cm. Right gastric lymph node 1.1 cm in short axis on image 61 series 2, formerly not well seen. Scattered upper normal sized left periaortic lymph nodes. Reproductive: Brachytherapy seed implants in the prostate  gland. As before, gas density from the anorectal region extends further anteriorly into the vicinity of the prostate that would otherwise be expected. Other: No supplemental non-categorized findings. Musculoskeletal: There is a suggestion of  minimal bilateral chronic hip avascular necrosis with sclerosis anteriorly in both femoral heads. Lumbar spondylosis and degenerative disc disease causing bilateral foraminal impingement at L5-S1. IMPRESSION: 1. Enlarging left upper lobe nodule and worsening adenopathy in the chest and upper abdomen, compatible with progressive malignancy. 2. Infrarenal abdominal aortic aneurysm 3.4 cm in diameter. Recommend follow-up ultrasound every 3 years. This recommendation follows ACR consensus guidelines: White Paper of the ACR Incidental Findings Committee II on Vascular Findings. J Am Coll Radiol 2013; 10:789-794. 3. Considerable atherosclerosis with chronic stable findings in the chest including a small focal dissection at the origin of the left common carotid artery, and an outpouching of the left lateral side of the descending thoracic aorta potentially from saccular aneurysm or chronic pseudoaneurysm. There is also a web like residuum from prior remote pulmonary embolus in the left lower lobe. No acute pulmonary embolus. 4. Complex small left pleural effusion, likely malignant. 5. Thickened right posterior urinary bladder wall, stable, sessile tumor not excluded. 6. Stable gas density along the anterior anorectal margin extending into the vicinity the prostate gland, significance uncertain. Brachytherapy seeds are present in the prostate gland. 7. Other imaging findings of potential clinical significance: Aortic Atherosclerosis (ICD10-I70.0). Coronary atherosclerosis. Mild cardiomegaly. Emphysema (ICD10-J43.9). Descending and sigmoid colon diverticulosis. Chronic bilateral hip avascular necrosis (mild). Bilateral foraminal impingement at L5-S1. Small direct right inguinal hernia appears stable and contains a small loop of small bowel without complicating feature. Electronically Signed   By: Van Clines M.D.   On: 08/22/2020 09:49    Assessment and plan-  #ESRD, volume overload.  Managed by  nephrology #Fatigue and weakness.  Wondering if this is secondary to subclinical hypothyroidism.  Continue Synthroid 21mcg daily. #Metastatic lung cancer progressed on first line treatment with Keytruda. If he is able to recover to his baseline, the plan is to proceed with single agent Taxol. Patient has poor family support. Agree with palliative care consultation PT OT for evaluation of deconditioning and need of rehab    Thank you for allowing me to participate in the care of this patient.   Earlie Server, MD, PhD Hematology Oncology [facility] - Closed at [facility] Pager- [phone] 09/03/2020

## 2020-09-03 NOTE — TOC Progression Note (Signed)
Transition of Care [facility]) - Progression Note    Patient Details  Name: Jose [name] MRN: [id number] Date of Birth: [birth year]  Transition of Care [facility]) CM/SW Contact  Shelbie Hutching, RN Phone Number: 09/03/2020, 2:59 PM  Clinical Narrative:    RNCM spoke with patient via phone about discharge planning.  2 bed offers reviewed patient chooses [facility] in [place].  Potential discharge tomorrow.     Expected Discharge Plan: Skilled Nursing Facility Barriers to Discharge: Continued Medical Work up  Expected Discharge Plan and Services Expected Discharge Plan: [place]       Living arrangements for the past 2 months: Single Family Home                                       Social Determinants of Health (SDOH) Interventions    Readmission Risk Interventions Readmission Risk Prevention Plan 09/02/2020 12/20/2018 10/15/2018  Transportation Screening Complete Complete Complete  PCP or Specialist Appt within 3-5 Days - - (No Data)  [place] or [place] - Complete -  Social Work Consult for [place] Planning/Counseling - - -  Palliative Care Screening - - -  Medication Review Press photographer) Complete Complete -  PCP or Specialist appointment within 3-5 days of discharge Complete - -  [place] or [place] Not Complete - -  [place] or Home Care Consult Pt Refusal Comments SNF recommended - -  Palliative Care Screening Not Applicable - -  Skilled Nursing Facility Complete - -  Some recent data might be hidden

## 2020-09-04 DIAGNOSIS — E039 Hypothyroidism, unspecified: Secondary | ICD-10-CM | POA: Diagnosis not present

## 2020-09-04 DIAGNOSIS — C3492 Malignant neoplasm of unspecified part of left bronchus or lung: Secondary | ICD-10-CM | POA: Diagnosis not present

## 2020-09-04 DIAGNOSIS — R5381 Other malaise: Secondary | ICD-10-CM

## 2020-09-04 DIAGNOSIS — R531 Weakness: Secondary | ICD-10-CM | POA: Diagnosis not present

## 2020-09-04 DIAGNOSIS — N186 End stage renal disease: Secondary | ICD-10-CM | POA: Diagnosis not present

## 2020-09-04 LAB — CBC
HCT: 36.6 % — ABNORMAL LOW (ref 39.0–52.0)
Hemoglobin: 12.2 g/dL — ABNORMAL LOW (ref 13.0–17.0)
MCH: 32.5 pg (ref 26.0–34.0)
MCHC: 33.3 g/dL (ref 30.0–36.0)
MCV: 97.6 fL (ref 80.0–100.0)
Platelets: 118 10*3/uL — ABNORMAL LOW (ref 150–400)
RBC: 3.75 MIL/uL — ABNORMAL LOW (ref 4.22–5.81)
RDW: 16.4 % — ABNORMAL HIGH (ref 11.5–15.5)
WBC: 7 10*3/uL (ref 4.0–10.5)
nRBC: 0 % (ref 0.0–0.2)

## 2020-09-04 LAB — SARS CORONAVIRUS 2 (TAT 6-24 HRS): SARS Coronavirus 2: NEGATIVE

## 2020-09-04 LAB — HEPATITIS B DNA, ULTRAQUANTITATIVE, PCR
HBV DNA SERPL PCR-ACNC: NOT DETECTED IU/mL
HBV DNA SERPL PCR-LOG IU: UNDETERMINED log10 IU/mL

## 2020-09-04 LAB — BASIC METABOLIC PANEL
Anion gap: 9 (ref 5–15)
BUN: 47 mg/dL — ABNORMAL HIGH (ref 8–23)
CO2: 28 mmol/L (ref 22–32)
Calcium: 10.9 mg/dL — ABNORMAL HIGH (ref 8.9–10.3)
Chloride: 95 mmol/L — ABNORMAL LOW (ref 98–111)
Creatinine, Ser: 7.98 mg/dL — ABNORMAL HIGH (ref 0.61–1.24)
GFR, Estimated: 7 mL/min — ABNORMAL LOW (ref >60)
Glucose, Bld: 92 mg/dL (ref 70–99)
Potassium: 4.4 mmol/L (ref 3.5–5.1)
Sodium: 132 mmol/L — ABNORMAL LOW (ref 135–145)

## 2020-09-04 LAB — GLUCOSE, CAPILLARY: Glucose-Capillary: 107 mg/dL — ABNORMAL HIGH (ref 70–99)

## 2020-09-04 LAB — HEPATITIS B SURFACE ANTIBODY, QUANTITATIVE: Hep B S AB Quant (Post): 8.1 m[IU]/mL — ABNORMAL LOW (ref >9.9)

## 2020-09-04 MED ORDER — IPRATROPIUM-ALBUTEROL 0.5-2.5 (3) MG/3ML IN SOLN: 3.0000 mL | Freq: Three times a day (TID) | RESPIRATORY_TRACT | Status: DC

## 2020-09-04 MED ORDER — METOPROLOL SUCCINATE ER 25 MG PO TB24: 25.0000 mg | ORAL_TABLET | Freq: Every day | ORAL | 0 refills | Status: DC

## 2020-09-04 MED ORDER — COMBIVENT RESPIMAT 20-100 MCG/ACT IN AERS: 1.0000 | INHALATION_SPRAY | Freq: Four times a day (QID) | RESPIRATORY_TRACT | 0 refills | Status: DC

## 2020-09-04 MED ORDER — LOSARTAN POTASSIUM 50 MG PO TABS: 50.0000 mg | ORAL_TABLET | Freq: Every day | ORAL | 0 refills | Status: DC

## 2020-09-04 MED ORDER — RENA-VITE PO TABS: 1.0000 | ORAL_TABLET | Freq: Every day | ORAL | 0 refills | Status: DC

## 2020-09-04 MED ORDER — NEPRO/CARBSTEADY PO LIQD: 237.0000 mL | Freq: Two times a day (BID) | ORAL | 0 refills | Status: DC

## 2020-09-04 MED ORDER — ACETAMINOPHEN 325 MG PO TABS: 650.0000 mg | ORAL_TABLET | Freq: Four times a day (QID) | ORAL | Status: AC | PRN

## 2020-09-04 MED ORDER — OXYCODONE HCL 5 MG PO TABS: 5.0000 mg | ORAL_TABLET | Freq: Four times a day (QID) | ORAL | 0 refills | Status: DC | PRN

## 2020-09-04 MED ORDER — DOXYCYCLINE HYCLATE 100 MG PO TABS: 100.0000 mg | ORAL_TABLET | Freq: Two times a day (BID) | ORAL | 0 refills | Status: AC

## 2020-09-04 MED ORDER — POLYETHYLENE GLYCOL 3350 17 G PO PACK: 17.0000 g | PACK | Freq: Every day | ORAL | 0 refills | Status: AC | PRN

## 2020-09-04 MED ORDER — METOPROLOL SUCCINATE ER 25 MG PO TB24: 25.0000 mg | ORAL_TABLET | Freq: Every day | ORAL | Status: DC

## 2020-09-04 MED ORDER — TORSEMIDE 100 MG PO TABS
100.0000 mg | ORAL_TABLET | Freq: Every day | ORAL | Status: DC
  Filled 2020-09-04: qty 1

## 2020-09-04 MED ORDER — TORSEMIDE 100 MG PO TABS: 100.0000 mg | ORAL_TABLET | Freq: Every day | ORAL | 0 refills | Status: DC

## 2020-09-04 MED ORDER — GABAPENTIN 100 MG PO CAPS: 100.0000 mg | ORAL_CAPSULE | Freq: Three times a day (TID) | ORAL | 0 refills | Status: AC

## 2020-09-04 NOTE — Progress Notes (Signed)
PT Cancellation Note  Patient Details Name: MARCIN HOLTE MRN: [id number] DOB: March 09, 1946   Cancelled Treatment:    Reason Eval/Treat Not Completed: Other (comment).  Chart reviewed.  Pt currently off unit at dialysis.  Will re-attempt PT session at a later date/time.  Leitha Bleak, PT 09/04/20, 10:18 AM

## 2020-09-04 NOTE — Progress Notes (Signed)
Correction patient chair time is 11:15am TTS at [facility] .

## 2020-09-04 NOTE — Progress Notes (Signed)
[facility], [place] 09/04/20  Subjective:   LOS: 3 Patient presented to the ER for generalized weakness.  He states that he is now not able to walk as good..  No shortness of breath.  Appetite is low.  No nausea or vomiting.  No diarrhea.  No fever.  Gets dialysis from the left arm AVF  Last dialysis was on Thursday.  Patient seen during dialysis   HEMODIALYSIS FLOWSHEET:  Blood Flow Rate (mL/min): 400 mL/min Arterial Pressure (mmHg): -150 mmHg Venous Pressure (mmHg): 260 mmHg Transmembrane Pressure (mmHg): 30 mmHg Ultrafiltration Rate (mL/min): 830 mL/min Dialysate Flow Rate (mL/min): 500 ml/min Conductivity: Machine : 13.7 Conductivity: Machine : 13.7 Dialysis Fluid Bolus: Normal Saline Bolus Amount (mL): 200 mL    Objective:  Vital signs in last 24 hours:  Temp:  [97.5 F (36.4 C)-98.3 F (36.8 C)] 98.3 F (36.8 C) (04/05 0833) Pulse Rate:  [62-74] 70 (04/05 0915) Resp:  [13-20] 13 (04/05 0915) BP: (114-138)/(73-87) 127/75 (04/05 0915) SpO2:  [90 %-99 %] 96 % (04/05 0833)  Weight change:  Filed Weights   09/01/20 2000  Weight: 62.3 kg    Intake/Output:    Intake/Output Summary (Last 24 hours) at 09/04/2020 0953 Last data filed at 09/03/2020 1010 Gross per 24 hour  Intake 240 ml  Output --  Net 240 ml    Physical Exam: General:  No acute distress, laying in the bed  HEENT  anicteric, moist oral mucous membrane  Pulm/lungs  normal breathing effort, clear, room air  CVS/Heart  regular rhythm, no rub or gallop  Abdomen:   Soft, nontender  Extremities:  + peripheral edema  Neurologic:  Alert, oriented, able to follow commands  Skin:  No acute rashes  Left arm aneursmal AVF     Basic Metabolic Panel:  Recent Labs  Lab 08/30/20 1827 09/01/20 1049 09/01/20 2026 09/04/20 0342  NA 136 134*  --  132*  K 3.6 3.6  --  4.4  CL 94* 93*  --  95*  CO2 29 27  --  28  GLUCOSE 89 123*  --  92  BUN 20 36*  --  47*  CREATININE  5.32* 7.73*  --  7.98*  CALCIUM 9.0 9.6  --  10.9*  PHOS  --   --  3.3  --      CBC: Recent Labs  Lab 08/30/20 1827 09/01/20 1049 09/04/20 0342  WBC 8.9 8.1 7.0  NEUTROABS 6.8  --   --   HGB 13.8 14.3 12.2*  HCT 41.3 43.0 36.6*  MCV 96.5 98.2 97.6  PLT 138* 133* 118*      Lab Results  Component Value Date   HEPBSAG NON REACTIVE 09/01/2020   HEPBSAB Reactive 03/22/2018   HEPBIGM Negative 03/22/2018      Microbiology:  Recent Results (from the past 240 hour(s))  Resp Panel by RT-PCR (Flu A&B, Covid) Nasopharyngeal Swab     Status: None   Collection Time: 09/01/20  1:25 PM   Specimen: Nasopharyngeal Swab; Nasopharyngeal(NP) swabs in vial transport medium  Result Value Ref Range Status   SARS Coronavirus 2 by RT PCR NEGATIVE NEGATIVE Final    Comment: (NOTE) SARS-CoV-2 target nucleic acids are NOT DETECTED.  The SARS-CoV-2 RNA is generally detectable in upper respiratory specimens during the acute phase of infection. The lowest concentration of SARS-CoV-2 viral copies this assay can detect is 138 copies/mL. A negative result does not preclude SARS-Cov-2 infection and should not be used as  the sole basis for treatment or other patient management decisions. A negative result may occur with  improper specimen collection/handling, submission of specimen other than nasopharyngeal swab, presence of viral mutation(s) within the areas targeted by this assay, and inadequate number of viral copies(<138 copies/mL). A negative result must be combined with clinical observations, patient history, and epidemiological information. The expected result is Negative.  Fact Sheet for Patients:  EntrepreneurPulse.com.au  Fact Sheet for Healthcare Providers:  IncredibleEmployment.be  This test is no t yet approved or cleared by the [place] FDA and  has been authorized for detection and/or diagnosis of SARS-CoV-2 by FDA under an Emergency Use  Authorization (EUA). This EUA will remain  in effect (meaning this test can be used) for the duration of the COVID-19 declaration under Section 564(b)(1) of the Act, 21 U.S.C.section 360bbb-3(b)(1), unless the authorization is terminated  or revoked sooner.       Influenza A by PCR NEGATIVE NEGATIVE Final   Influenza B by PCR NEGATIVE NEGATIVE Final    Comment: (NOTE) The Xpert Xpress SARS-CoV-2/FLU/RSV plus assay is intended as an aid in the diagnosis of influenza from Nasopharyngeal swab specimens and should not be used as a sole basis for treatment. Nasal washings and aspirates are unacceptable for Xpert Xpress SARS-CoV-2/FLU/RSV testing.  Fact Sheet for Patients: EntrepreneurPulse.com.au  Fact Sheet for Healthcare Providers: IncredibleEmployment.be  This test is not yet approved or cleared by the [place] FDA and has been authorized for detection and/or diagnosis of SARS-CoV-2 by FDA under an Emergency Use Authorization (EUA). This EUA will remain in effect (meaning this test can be used) for the duration of the COVID-19 declaration under Section 564(b)(1) of the Act, 21 U.S.C. section 360bbb-3(b)(1), unless the authorization is terminated or revoked.  Performed at [facility], [address] [place], [place] [address]   MRSA PCR Screening     Status: None   Collection Time: 09/02/20  6:03 AM   Specimen: Nasopharyngeal  Result Value Ref Range Status   MRSA by PCR NEGATIVE NEGATIVE Final    Comment:        The GeneXpert MRSA Assay (FDA approved for NASAL specimens only), is one component of a comprehensive MRSA colonization surveillance program. It is not intended to diagnose MRSA infection nor to guide or monitor treatment for MRSA infections. Performed at [facility], [address] [place], [place] [address]     Coagulation Studies: No results for input(s): LABPROT, INR in the last 72  hours.  Urinalysis: No results for input(s): COLORURINE, LABSPEC, PHURINE, GLUCOSEU, HGBUR, BILIRUBINUR, KETONESUR, PROTEINUR, UROBILINOGEN, NITRITE, LEUKOCYTESUR in the last 72 hours.  Invalid input(s): APPERANCEUR    Imaging: No results found.   Medications:    Scheduled Meds: Continuous Infusions: PRN Meds:.    Assessment/ Plan:  75 y.o. male with  was admitted on 09/01/2020 for  Active Problems:   Type II diabetes mellitus with renal manifestations (HCC)   ESRD on dialysis ([place])   Gangrene of toe of right foot (HCC)   Atherosclerosis of native arteries of the extremities with gangrene (HCC)   HLD (hyperlipidemia)   Stroke (HCC)   Anxiety and depression   Tobacco abuse   Chronic diastolic CHF (congestive heart failure) ([place])   Type 2 diabetes mellitus with hyperlipidemia ([place])   Primary adenocarcinoma of left lung (HCC)   Malignant pleural effusion   Physical deconditioning   Hypervolemia   Weakness   Hypothyroidism   Protein-calorie malnutrition, severe  Physical deconditioning [  R53.81] x 2 weeks   [facility] TuThSa-2  wt 66.5 kg  #. ESRD -Receiving dialysis today - UF challenge of 2L- acheived - Next treatment scheduled for Thursday - Will continue outpatient treatment at assigned center  #. Anemia of CKD  Lab Results  Component Value Date   HGB 12.2 (L) 09/04/2020   Avoid Epogen as patient has known malignancy Given chemotherapy and is followed by Dr. Tasia Catchings at the cancer center  #. Secondary hyperparathyroidism of renal origin N 25.81      Component Value Date/Time   PTH 226 (H) 03/25/2018 1557   Lab Results  Component Value Date   PHOS 3.3 09/01/2020  Continue Calcium acetate with meals Phosphorus within range   #.  Generalized weakness Plan as per hospitalist PT recommending rehab Patient agreeable   LOS: 3 Jose [name] 4/5/20229:53 AM  [facility] [place], [place]  Patient was  seen and evaluated with Jose Flattery, NP.  Plan of care was discussed with patient as well as NP.  She assisted with transcription of the note.

## 2020-09-04 NOTE — TOC Progression Note (Signed)
Transition of Care [facility]) - Progression Note    Patient Details  Name: EZRAEL SAM MRN: [id number] Date of Birth: 1946/02/12  Transition of Care [facility]) CM/SW Contact  Shelbie Hutching, RN Phone Number: 09/04/2020, 3:05 PM  Clinical Narrative:    [facility] has been called and patient is on their list for pickup.  They did not give a time estimate.    Expected Discharge Plan: Skilled Nursing Facility Barriers to Discharge: Barriers Resolved  Expected Discharge Plan and Services Expected Discharge Plan: [place] arrangements for the past 2 months: Single Family Home Expected Discharge Date: 09/04/20                                     Social Determinants of Health (SDOH) Interventions    Readmission Risk Interventions Readmission Risk Prevention Plan 09/02/2020 12/20/2018 10/15/2018  Transportation Screening Complete Complete Complete  PCP or Specialist Appt within 3-5 Days - - (No Data)  [place] or [place] - Complete -  Social Work Consult for [place] Planning/Counseling - - -  Palliative Care Screening - - -  Medication Review Press photographer) Complete Complete -  PCP or Specialist appointment within 3-5 days of discharge Complete - -  [place] or [place] Not Complete - -  [place] or Home Care Consult Pt Refusal Comments SNF recommended - -  Palliative Care Screening Not Applicable - -  Skilled Nursing Facility Complete - -  Some recent data might be hidden

## 2020-09-04 NOTE — Progress Notes (Signed)
OT Cancellation Note  Patient Details Name: Jose [name] MRN: [id number] DOB: [birth year]   Cancelled Treatment:    Reason Eval/Treat Not Completed: Patient at procedure or test/ unavailable. Pt currently off the floor for HD. OT to re-attempt at next available time.   Darleen Crocker, [place], OTR/L , CBIS ascom [phone]  09/04/20, 9:09 AM   09/04/2020, 9:09 AM

## 2020-09-04 NOTE — Plan of Care (Signed)
  Problem: Health Behavior/Discharge Planning: Goal: Ability to manage health-related needs will improve Outcome: Progressing   Problem: Education: Goal: Knowledge of General Education information will improve Description: Including pain rating scale, medication(s)/side effects and non-pharmacologic comfort measures Outcome: Progressing   Problem: Clinical Measurements: Goal: Ability to maintain clinical measurements within normal limits will improve Outcome: Progressing Goal: Will remain free from infection Outcome: Progressing Goal: Diagnostic test results will improve Outcome: Progressing Goal: Respiratory complications will improve Outcome: Progressing

## 2020-09-04 NOTE — Progress Notes (Signed)
Pt is going to transfer to room 330 after his dialysis , report given to Finis Bud RN

## 2020-09-04 NOTE — TOC Transition Note (Signed)
Transition of Care [facility]) - CM/SW Discharge Note   Patient Details  Name: TADARIUS MALAND MRN: [id number] Date of Birth: Sep 17, 1945  Transition of Care [facility]) CM/SW Contact:  Shelbie Hutching, RN Phone Number: 09/04/2020, 1:50 PM   Clinical Narrative:    Patient is medically cleared for discharge to [facility] today.  Patient has completed his dialysis session for today, next treatment will be Thursday at [facility] in [place] at 1115.  Patient is going to room 22B.  Patient and his sister, [name] have been updated on discharge plan for today.  Bedside RN will call report to [phone].  COVID test is still pending- once COVID results RNCM will arrange EMS.    Final next level of care: Skilled Nursing Facility Barriers to Discharge: Barriers Resolved   Patient Goals and CMS Choice Patient states their goals for this hospitalization and ongoing recovery are:: To go home after rehab CMS Medicare.gov Compare Post Acute Care list provided to:: Patient Choice offered to / list presented to : Patient  Discharge Placement              Patient chooses bed at: [facility] Patient to be transferred to facility by: [facility] Name of family member notified: [name] (sister) Patient and family notified of of transfer: 09/04/20  Discharge Plan and Services                                     Social Determinants of Health ([place]) Interventions     Readmission Risk Interventions Readmission Risk Prevention Plan 09/02/2020 12/20/2018 10/15/2018  Transportation Screening Complete Complete Complete  PCP or Specialist Appt within 3-5 Days - - (No Data)  [place] or [place] Work Consult for [place] Planning/Counseling - - -  Palliative Care Screening - - -  Medication Review Press photographer) Complete Complete -  PCP or Specialist appointment within 3-5 days of discharge Complete - -  [place] or [place] Not Complete - -   [place] or Home Care Consult Pt Refusal Comments SNF recommended - -  Palliative Care Screening Not Applicable - -  Skilled Nursing Facility Complete - -  Some recent data might be hidden

## 2020-09-04 NOTE — Discharge Summary (Signed)
[facility] [place] NAME: Jose [name]    MR#:  [id number]  DATE OF BIRTH:  09/23/1945  DATE OF ADMISSION:  09/01/2020 ADMITTING PHYSICIAN: Clarnce Flock, MD  DATE OF DISCHARGE: 09/04/2020  PRIMARY CARE PHYSICIAN: [name], MD    ADMISSION DIAGNOSIS:  Physical deconditioning [R53.81]  DISCHARGE DIAGNOSIS:  1.  Fluid overload, chronic diastolic congestive heart failure 2.  Generalized weakness 3.  Questionable pneumonia 4.  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 5.  Hypothyroidism unspecified 6.  Anxiety depression 7.  History of CAD 8.  Neuropathy 9.  Type 2 diabetes mellitus with hyperlipidemia 10.  BPH on Flomax 11.  Severe protein calorie malnutrition 12.  Neuropathy 13.  End-stage renal disease on Tuesday Thursday Saturday  SECONDARY DIAGNOSIS:   Past Medical History:  Diagnosis Date  . Chronic kidney disease   . Depression   . Diabetes mellitus without complication ([place])   . Glaucoma   . Glaucoma   . Gout   . Heart murmur   . HLD (hyperlipidemia)   . Hypertension   . Primary adenocarcinoma of left lung ([place])   . Prostate cancer ([place])   . Renal failure    dialysis t/t/s  . Renal insufficiency   . Stroke ([place])    tia x 2    HOSPITAL COURSE:   1.  Fluid overload and chronic diastolic congestive heart failure.  Dialysis to remove fluid.  Change blood pressure medications to Toprol-XL and losartan and patient also on Demadex.  Dialysis to remove fluid. 2.  Generalized weakness.  Physical therapy and Occupational Therapy recommending rehab.  We will give a statin holiday at this point.  If no change in the weakness after we can restart atorvastatin. 3.  Questionable pneumonia.  I started doxycycline and added nebulizers while here we will send to rehab on Combivent inhaler. 4.  Primary adenocarcinoma left lung with metastases to the  pleural space.  Stage IV lung cancer.  Patient has poor appetite.  Continue nutritional support.  Appreciate palliative care consultation.  Overall prognosis poor 5.  Hypothyroidism unspecified on levothyroxine 6.  Anxiety depression on Lexapro 7.  History of CAD, left bundle branch block.  On aspirin and Plavix and Toprol-XL.  Will be on a statin holiday at this point.  Follow-up with cardiology as outpatient. 8.  Essential hypertension on Coreg and losartan 9.  Neuropathy on low-dose gabapentin 10.  Type 2 diabetes mellitus with hyperlipidemia.  I discontinued atorvastatin because of generalized weakness.  Patient's hemoglobin A1c is on the lower side at 6.3 and can watch off medications at this point.  Check fingersticks before every meal and nightly if elevated can start sliding scale insulin. 11.  BPH on Flomax 12.  Severe protein calorie malnutrition continue Nepro drink 13.  End-stage renal disease on hemodialysis Tuesday Thursday Saturday 14 diet is renal diet with carb modified   DISCHARGE CONDITIONS:   Fair  CONSULTS OBTAINED:  Treatment Team:  Earlie Server, MD  DRUG ALLERGIES:   Allergies  Allergen Reactions  . Penicillin G Other (See Comments)    Other reaction(s): Unknown DID THE REACTION INVOLVE: Swelling of the face/tongue/throat, SOB, or low BP? Unknown Sudden or severe rash/hives, skin peeling, or the inside of the mouth or nose? Unknown Did it require medical treatment? Unknown When did it last happen?unknown If all above answers are "NO", may proceed  with cephalosporin use.     DISCHARGE MEDICATIONS:   Allergies as of 09/04/2020      Reactions   Penicillin G Other (See Comments)   Other reaction(s): Unknown DID THE REACTION INVOLVE: Swelling of the face/tongue/throat, SOB, or low BP? Unknown Sudden or severe rash/hives, skin peeling, or the inside of the mouth or nose? Unknown Did it require medical treatment? Unknown When did it last happen?unknown If all  above answers are "NO", may proceed with cephalosporin use.      Medication List    STOP taking these medications   allopurinol 100 MG tablet Commonly known as: ZYLOPRIM   amLODipine 5 MG tablet Commonly known as: NORVASC   atorvastatin 10 MG tablet Commonly known as: LIPITOR   azithromycin 250 MG tablet Commonly known as: Zithromax   diphenoxylate-atropine 2.5-0.025 MG tablet Commonly known as: LOMOTIL   glipiZIDE 5 MG tablet Commonly known as: GLUCOTROL   traMADol 50 MG tablet Commonly known as: ULTRAM     TAKE these medications   acetaminophen 325 MG tablet Commonly known as: TYLENOL Take 2 tablets (650 mg total) by mouth every 6 (six) hours as needed for mild pain, moderate pain, fever or headache.   aspirin 81 MG EC tablet Take 1 tablet (81 mg total) by mouth daily.   calcium acetate 667 MG capsule Commonly known as: PHOSLO Take 2 capsules (1,334 mg total) by mouth 3 (three) times daily with meals.   clopidogrel 75 MG tablet Commonly known as: PLAVIX Take 1 tablet (75 mg total) by mouth daily.   Combivent Respimat 20-100 MCG/ACT Aers respimat Generic drug: Ipratropium-Albuterol Inhale 1 puff into the lungs every 6 (six) hours.   doxycycline 100 MG tablet Commonly known as: VIBRA-TABS Take 1 tablet (100 mg total) by mouth every 12 (twelve) hours for 4 days.   escitalopram 10 MG tablet Commonly known as: Lexapro Take 1 tablet (10 mg total) by mouth daily.   feeding supplement (NEPRO CARB STEADY) Liqd Take 237 mLs by mouth 2 (two) times daily between meals.   gabapentin 100 MG capsule Commonly known as: NEURONTIN Take 1 capsule (100 mg total) by mouth 3 (three) times daily. What changed:   medication strength  how much to take   guaiFENesin 600 MG 12 hr tablet Commonly known as: MUCINEX Take 1 tablet (600 mg total) by mouth 2 (two) times daily. What changed: Another medication with the same name was removed. Continue taking this medication, and  follow the directions you see here.   levothyroxine 50 MCG tablet Commonly known as: Synthroid Take 1 tablet (50 mcg total) by mouth daily before breakfast.   losartan 50 MG tablet Commonly known as: COZAAR Take 1 tablet (50 mg total) by mouth daily.   metoprolol succinate 25 MG 24 hr tablet Commonly known as: TOPROL-XL Take 1 tablet (25 mg total) by mouth at bedtime.   multivitamin Tabs tablet Take 1 tablet by mouth at bedtime.   nicotine 21 mg/24hr patch Commonly known as: NICODERM CQ - dosed in mg/24 hours Place 1 patch (21 mg total) onto the skin daily.   ondansetron 4 MG tablet Commonly known as: ZOFRAN Take 1 tablet (4 mg total) by mouth every 6 (six) hours as needed for nausea.   oxyCODONE 5 MG immediate release tablet Commonly known as: Oxy IR/ROXICODONE Take 1 tablet (5 mg total) by mouth every 6 (six) hours as needed for severe pain.   polyethylene glycol 17 g packet Commonly known as: MIRALAX / GLYCOLAX  Take 17 g by mouth daily as needed for mild constipation.   tamsulosin 0.4 MG Caps capsule Commonly known as: FLOMAX Take 1 capsule (0.4 mg total) by mouth daily.   torsemide 100 MG tablet Commonly known as: DEMADEX Take 1 tablet (100 mg total) by mouth daily.        DISCHARGE INSTRUCTIONS:   Follow-up Dr. Ernst Spell 1 day Follow-up oncology in a few weeks Follow-up cardiology in 3 weeks Follow-up dialysis Tuesday Thursday Saturday as outpatient  If you experience worsening of your admission symptoms, develop shortness of breath, life threatening emergency, suicidal or homicidal thoughts you must seek medical attention immediately by calling 911 or calling your MD immediately  if symptoms less severe.  You Must read complete instructions/literature along with all the possible adverse reactions/side effects for all the Medicines you take and that have been prescribed to you. Take any new Medicines after you have completely understood and accept all the  possible adverse reactions/side effects.   Please note  You were cared for by a hospitalist during your hospital stay. If you have any questions about your discharge medications or the care you received while you were in the hospital after you are discharged, you can call the unit and asked to speak with the hospitalist on call if the hospitalist that took care of you is not available. Once you are discharged, your primary care physician will handle any further medical issues. Please note that NO REFILLS for any discharge medications will be authorized once you are discharged, as it is imperative that you return to your primary care physician (or establish a relationship with a primary care physician if you do not have one) for your aftercare needs so that they can reassess your need for medications and monitor your lab values.    Today   CHIEF COMPLAINT:   Chief Complaint  Patient presents with  . Weakness    HISTORY OF PRESENT ILLNESS:  Jose [name]  is a 75 y.o. male came in with weakness   VITAL SIGNS:  Blood pressure 121/74, pulse 68, temperature 98.3 F (36.8 C), temperature source Oral, resp. rate 13, height 6\' 1"  (1.854 m), weight 62.3 kg, SpO2 96 %.  I/O:    Intake/Output Summary (Last 24 hours) at 09/04/2020 1255 Last data filed at 09/04/2020 1200 Gross per 24 hour  Intake --  Output 2000 ml  Net -2000 ml    PHYSICAL EXAMINATION:  GENERAL:  75 y.o.-year-old patient lying in the bed with no acute distress.  EYES: Pupils equal, round, reactive to light HEENT: Head atraumatic, normocephalic. Oropharynx and nasopharynx clear.   LUNGS: Normal breath sounds bilaterally, no wheezing, rales,rhonchi or crepitation. No use of accessory muscles of respiration.  CARDIOVASCULAR: S1, S2 normal.  2-6 systolic murmurs.  No rubs, or gallops.  ABDOMEN: Soft, non-tender, non-distended. Bowel sounds present. No organomegaly or mass.  EXTREMITIES: No pedal edema.  NEUROLOGIC: Cranial  nerves II through XII are intact. Muscle strength 5/5 in all extremities. Sensation intact. Gait not checked.  PSYCHIATRIC: The patient is alert and oriented x 3.  SKIN: No obvious rash, lesion, or ulcer.   DATA REVIEW:   CBC Recent Labs  Lab 09/04/20 0342  WBC 7.0  HGB 12.2*  HCT 36.6*  PLT 118*    Chemistries  Recent Labs  Lab 08/30/20 1827 09/01/20 1049 09/04/20 0342  NA 136   < > 132*  K 3.6   < > 4.4  CL 94*   < >  95*  CO2 29   < > 28  GLUCOSE 89   < > 92  BUN 20   < > 47*  CREATININE 5.32*   < > 7.98*  CALCIUM 9.0   < > 10.9*  AST 19  --   --   ALT 13  --   --   ALKPHOS 36*  --   --   BILITOT 1.2  --   --    < > = values in this interval not displayed.     Microbiology Results  Results for orders placed or performed during the hospital encounter of 09/01/20  Resp Panel by RT-PCR (Flu A&B, Covid) Nasopharyngeal Swab     Status: None   Collection Time: 09/01/20  1:25 PM   Specimen: Nasopharyngeal Swab; Nasopharyngeal(NP) swabs in vial transport medium  Result Value Ref Range Status   SARS Coronavirus 2 by RT PCR NEGATIVE NEGATIVE Final    Comment: (NOTE) SARS-CoV-2 target nucleic acids are NOT DETECTED.  The SARS-CoV-2 RNA is generally detectable in upper respiratory specimens during the acute phase of infection. The lowest concentration of SARS-CoV-2 viral copies this assay can detect is 138 copies/mL. A negative result does not preclude SARS-Cov-2 infection and should not be used as the sole basis for treatment or other patient management decisions. A negative result may occur with  improper specimen collection/handling, submission of specimen other than nasopharyngeal swab, presence of viral mutation(s) within the areas targeted by this assay, and inadequate number of viral copies(<138 copies/mL). A negative result must be combined with clinical observations, patient history, and epidemiological information. The expected result is Negative.  Fact  Sheet for Patients:  EntrepreneurPulse.com.au  Fact Sheet for Healthcare Providers:  IncredibleEmployment.be  This test is no t yet approved or cleared by the [place] FDA and  has been authorized for detection and/or diagnosis of SARS-CoV-2 by FDA under an Emergency Use Authorization (EUA). This EUA will remain  in effect (meaning this test can be used) for the duration of the COVID-19 declaration under Section 564(b)(1) of the Act, 21 U.S.C.section 360bbb-3(b)(1), unless the authorization is terminated  or revoked sooner.       Influenza A by PCR NEGATIVE NEGATIVE Final   Influenza B by PCR NEGATIVE NEGATIVE Final    Comment: (NOTE) The Xpert Xpress SARS-CoV-2/FLU/RSV plus assay is intended as an aid in the diagnosis of influenza from Nasopharyngeal swab specimens and should not be used as a sole basis for treatment. Nasal washings and aspirates are unacceptable for Xpert Xpress SARS-CoV-2/FLU/RSV testing.  Fact Sheet for Patients: EntrepreneurPulse.com.au  Fact Sheet for Healthcare Providers: IncredibleEmployment.be  This test is not yet approved or cleared by the [place] FDA and has been authorized for detection and/or diagnosis of SARS-CoV-2 by FDA under an Emergency Use Authorization (EUA). This EUA will remain in effect (meaning this test can be used) for the duration of the COVID-19 declaration under Section 564(b)(1) of the Act, 21 U.S.C. section 360bbb-3(b)(1), unless the authorization is terminated or revoked.  Performed at [facility], [address] [place], [place] [address]   MRSA PCR Screening     Status: None   Collection Time: 09/02/20  6:03 AM   Specimen: Nasopharyngeal  Result Value Ref Range Status   MRSA by PCR NEGATIVE NEGATIVE Final    Comment:        The GeneXpert MRSA Assay (FDA approved for NASAL specimens only), is one component of a comprehensive  MRSA colonization surveillance program.  It is not intended to diagnose MRSA infection nor to guide or monitor treatment for MRSA infections. Performed at [facility], [address], [place], [place] [address]      Management plans discussed with the patient, family (yesterday) and they are in agreement.  CODE STATUS:     Code Status Orders  (From admission, onward)         Start     Ordered   09/01/20 1952  Full code  Continuous        09/01/20 1951        Code Status History    Date Active Date Inactive Code Status Order ID Comments User Context   10/30/2019 0128 11/05/2019 2211 Full Code [id number]  Elwyn Reach, MD Inpatient   10/22/2019 2304 10/24/2019 2140 Full Code [id number]  Ivor Costa, MD Inpatient   12/15/2018 1639 12/21/2018 2155 Full Code [id number]  Saundra Shelling, MD Inpatient   10/11/2018 2235 10/16/2018 1851 Full Code [id number]  Vaughan Basta, MD ED   10/07/2018 1042 10/07/2018 1733 Full Code [id number]  Algernon Huxley, MD Inpatient   09/30/2018 0956 09/30/2018 1441 Full Code [id number]  Algernon Huxley, MD Inpatient   08/30/2018 1224 08/30/2018 2034 Full Code [id number]  Algernon Huxley, MD Inpatient   07/23/2018 1111 07/23/2018 1517 Full Code [id number]  Samara Deist, [facility] Inpatient   06/13/2018 0135 06/17/2018 2206 Full Code [id number]  Lance Coon, MD ED   03/21/2018 1315 03/28/2018 1004 Full Code [id number]  Saundra Shelling, MD ED   Advance Care Planning Activity      TOTAL TIME TAKING CARE OF THIS PATIENT: 35 minutes.    Loletha Grayer M.D on 09/04/2020 at 12:55 PM  Between 7am to 6pm - Pager - [phone]  After 6pm go to [email or web address] - password [email or web address]  [facility]  CC: Primary care physician; [name], MD

## 2020-09-04 NOTE — Progress Notes (Signed)
Hematology/Oncology Progress Note [facility] Telephone:([phone] [phone]  Patient Care Team: [name], MD as PCP - General (Family Medicine) Telford Nab, RN as Oncology Nurse Navigator Earlie Server, MD as Consulting Physician (Hematology and Oncology)   Name of the patient: Jose [name]  [id number]  12/22/1945  Date of visit: 09/04/20   INTERVAL HISTORY-  Patient is in dialysis.  No acute overnight issue. He reports feeling the same.    Review of systems- Review of Systems  Constitutional: Positive for fatigue.  Eyes: Negative for eye problems.  Respiratory: Positive for cough. Negative for shortness of breath.   Cardiovascular: Negative for chest pain.  Gastrointestinal: Negative for abdominal pain.  Skin: Negative for rash.  Hematological: Negative for adenopathy.  Psychiatric/Behavioral: Negative for confusion.    Allergies  Allergen Reactions  . Penicillin G Other (See Comments)    Other reaction(s): Unknown DID THE REACTION INVOLVE: Swelling of the face/tongue/throat, SOB, or low BP? Unknown Sudden or severe rash/hives, skin peeling, or the inside of the mouth or nose? Unknown Did it require medical treatment? Unknown When did it last happen?unknown If all above answers are "NO", may proceed with cephalosporin use.     Patient Active Problem List   Diagnosis Date Noted  . Protein-calorie malnutrition, severe 09/03/2020  . Hypervolemia   . Weakness   . Hypothyroidism   . Physical deconditioning 09/01/2020  . Weight loss 07/11/2020  . Encounter for antineoplastic immunotherapy 02/15/2020  . Goals of care, counseling/discussion 12/23/2019  . 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 Malignant neoplasm of upper lobe of left lung ([place]) 12/23/2019  . Thickening of wall of gallbladder   . Primary adenocarcinoma of left lung ([place])   . Hypokalemia 10/30/2019  . Chronic diastolic CHF (congestive heart failure) ([place])  10/30/2019  . Benign essential HTN 10/30/2019  . Malignant neoplasm of unknown origin ([place]) 10/30/2019  . Acute respiratory failure with hypoxia ([place]) 10/30/2019  . End-stage renal disease on hemodialysis ([place]) 10/30/2019  . Type 2 diabetes mellitus with hyperlipidemia ([place]) 10/30/2019  . SOB (shortness of breath) 10/29/2019  . Pulmonary edema 10/22/2019  . Diarrhea 10/22/2019  . Acute on chronic diastolic CHF (congestive heart failure) ([place]) 10/22/2019  . HLD (hyperlipidemia)   . Stroke ([place])   . Anxiety and depression   . Tobacco abuse   . Pleural effusion on left   . Acute urinary retention   . Foot ulcer ([place]) 12/15/2018  . Ischemic foot 10/11/2018  . Atherosclerosis of native arteries of the extremities with gangrene ([place]) 07/09/2018  . Gangrene of toe of right foot ([place]) 06/13/2018  . Type II diabetes mellitus with renal manifestations ([place]) 04/16/2018  . Hypertension 04/16/2018  . ESRD on dialysis ([place]) 04/16/2018  . Malnutrition of moderate degree 03/27/2018  . Renal failure 03/21/2018     Past Medical History:  Diagnosis Date  . Chronic kidney disease   . Depression   . Diabetes mellitus without complication ([place])   . Glaucoma   . Glaucoma   . Gout   . Heart murmur   . HLD (hyperlipidemia)   . Hypertension   . Primary adenocarcinoma of left lung ([place])   . Prostate cancer ([place])   . Renal failure    dialysis t/t/s  . Renal insufficiency   . Stroke ([place])    tia x 2     Past Surgical History:  Procedure Laterality Date  . AMPUTATION TOE Bilateral 07/23/2018   Procedure: TOE MPJ RIGHT 3RD  AND LEFT 2ND;  Surgeon: Samara Deist, DPM;  Location: [facility];  Service: Podiatry;  Laterality: Bilateral;  . APPENDECTOMY    . AV FISTULA PLACEMENT Left 06/09/2018   Procedure: ARTERIOVENOUS (AV) FISTULA CREATION ( BRACHIAL CEPHALIC);  Surgeon: Algernon Huxley, MD;  Location: [facility];  Service: Vascular;  Laterality: Left;  . CHOLECYSTECTOMY    . DIALYSIS/PERMA CATHETER  INSERTION N/A 03/24/2018   Procedure: DIALYSIS/PERMA CATHETER INSERTION;  Surgeon: Katha Cabal, MD;  Location: [place] [facility];  Service: Cardiovascular;  Laterality: N/A;  . DIALYSIS/PERMA CATHETER REMOVAL N/A 09/06/2018   Procedure: DIALYSIS/PERMA CATHETER REMOVAL;  Surgeon: Algernon Huxley, MD;  Location: [place] [facility];  Service: Cardiovascular;  Laterality: N/A;  . INSERTION PROSTATE RADIATION SEED    . IR PERC PLEURAL DRAIN W/INDWELL CATH W/IMG GUIDE  11/03/2019  . IRRIGATION AND DEBRIDEMENT FOOT Right 12/17/2018   Procedure: IRRIGATION AND DEBRIDEMENT FOOT;  Surgeon: Samara Deist, DPM;  Location: [facility];  Service: Podiatry;  Laterality: Right;  . LOWER EXTREMITY ANGIOGRAPHY Right 06/16/2018   Procedure: LOWER EXTREMITY ANGIOGRAPHY;  Surgeon: Algernon Huxley, MD;  Location: [place] [facility];  Service: Cardiovascular;  Laterality: Right;  . LOWER EXTREMITY ANGIOGRAPHY Left 08/30/2018   Procedure: LOWER EXTREMITY ANGIOGRAPHY;  Surgeon: Algernon Huxley, MD;  Location: [place] [facility];  Service: Cardiovascular;  Laterality: Left;  . LOWER EXTREMITY ANGIOGRAPHY Right 09/30/2018   Procedure: LOWER EXTREMITY ANGIOGRAPHY;  Surgeon: Algernon Huxley, MD;  Location: [place] [facility];  Service: Cardiovascular;  Laterality: Right;  . LOWER EXTREMITY ANGIOGRAPHY Left 10/07/2018   Procedure: LOWER EXTREMITY ANGIOGRAPHY;  Surgeon: Algernon Huxley, MD;  Location: [place] [facility];  Service: Cardiovascular;  Laterality: Left;  . LOWER EXTREMITY ANGIOGRAPHY Right 12/20/2018   Procedure: Lower Extremity Angiography;  Surgeon: Algernon Huxley, MD;  Location: [place] [facility];  Service: Cardiovascular;  Laterality: Right;  . TRANSMETATARSAL AMPUTATION Bilateral 10/13/2018   Procedure: 1.  Amputation right great toe MTPJ 2.  Amputation right second toe MTPJ 3.  Amputation right fourth toe MTPJ 4.  Amputation right fifth toe MTPJ 5.  Excision distal second metatarsal head right foot 6.  Excision distal  third metatarsal head right foot 7.  Amputation left third toe 8.  Incision and drainage infectionleft 2nd toe amputation site. ;  Surgeon: Samara Deist, DPM;  Location    Social History   Socioeconomic History  . Marital status: Widowed    Spouse name: Not on file  . Number of children: Not on file  . Years of education: Not on file  . Highest education level: Not on file  Occupational History    Employer: RETIRED  Tobacco Use  . Smoking status: Current Some Day Smoker    Packs/day: 0.10    Types: Cigarettes  . Smokeless tobacco: Never Used  . Tobacco comment: pt currently smokes about 3 cigarettes a day   Vaping Use  . Vaping Use: Never used  Substance and Sexual Activity  . Alcohol use: Not Currently    Alcohol/week: 0.0 standard drinks  . Drug use: Never  . Sexual activity: Not Currently  Other Topics Concern  . Not on file  Social History Narrative  . Not on file   Social Determinants of Health   Financial Resource Strain: Not on file  Food Insecurity: Not on file  Transportation Needs: Not on file  Physical Activity: Not on file  Stress: Not on file  Social Connections: Not  on file  Intimate Partner Violence: Not on file     Family History  Problem Relation Age of Onset  . Varicose Veins Neg Hx      Current Facility-Administered Medications:  .  (feeding supplement) PROSource Plus liquid 30 mL, 30 mL, Oral, BID BM, Wieting, Richard, MD, 30 mL at 09/04/20 4315 .  acetaminophen (TYLENOL) tablet 650 mg, 650 mg, Oral, Q6H PRN, Clarnce Flock, MD .  aspirin EC tablet 81 mg, 81 mg, Oral, Daily, Clarnce Flock, MD, 81 mg at 09/04/20 0810 .  calcium acetate (PHOSLO) capsule 1,334 mg, 1,334 mg, Oral, TID WC, Clarnce Flock, MD, 1,334 mg at 09/04/20 [phone] .  Chlorhexidine Gluconate Cloth 2 % PADS 6 each, 6 each, Topical, Q0600, Clarnce Flock, MD, 6 each at 09/04/20 0500 .  clopidogrel (PLAVIX) tablet 75 mg, 75 mg, Oral, Daily, Clarnce Flock,  MD, 75 mg at 09/04/20 0810 .  doxycycline (VIBRA-TABS) tablet 100 mg, 100 mg, Oral, Q12H, Wieting, Richard, MD, 100 mg at 09/03/20 2111 .  escitalopram (LEXAPRO) tablet 10 mg, 10 mg, Oral, Daily, Clarnce Flock, MD, 10 mg at 09/04/20 6761 .  feeding supplement (NEPRO CARB STEADY) liquid 237 mL, 237 mL, Oral, BID BM, Clarnce Flock, MD, 237 mL at 09/04/20 0820 .  gabapentin (NEURONTIN) capsule 100 mg, 100 mg, Oral, TID, Clarnce Flock, MD, 100 mg at 09/04/20 0810 .  heparin injection 5,000 Units, 5,000 Units, Subcutaneous, Q8H, Loletha Grayer, MD, 5,000 Units at 09/04/20 0510 .  ipratropium-albuterol (DUONEB) 0.5-2.5 (3) MG/3ML nebulizer solution 3 mL, 3 mL, Nebulization, TID, Wieting, Richard, MD .  levothyroxine (SYNTHROID) tablet 50 mcg, 50 mcg, Oral, QAC breakfast, Clarnce Flock, MD, 50 mcg at 09/04/20 0510 .  losartan (COZAAR) tablet 50 mg, 50 mg, Oral, Daily, Wieting, Richard, MD .  methocarbamol (ROBAXIN) 500 mg in dextrose 5 % 50 mL IVPB, 500 mg, Intravenous, Q6H PRN, Clarnce Flock, MD .  metoprolol succinate (TOPROL-XL) 24 hr tablet 25 mg, 25 mg, Oral, QHS, Wieting, Richard, MD .  multivitamin (RENA-VIT) tablet 1 tablet, 1 tablet, Oral, QHS, Loletha Grayer, MD, 1 tablet at 09/03/20 2211 .  ondansetron (ZOFRAN) tablet 4 mg, 4 mg, Oral, Q6H PRN **OR** ondansetron (ZOFRAN) injection 4 mg, 4 mg, Intravenous, Q6H PRN, Clarnce Flock, MD .  oxyCODONE (Oxy IR/ROXICODONE) immediate release tablet 5 mg, 5 mg, Oral, Q4H PRN, Clarnce Flock, MD .  polyethylene glycol (MIRALAX / GLYCOLAX) packet 17 g, 17 g, Oral, Daily PRN, Clarnce Flock, MD .  tamsulosin [facility]) capsule 0.4 mg, 0.4 mg, Oral, Daily, Clarnce Flock, MD, 0.4 mg at 09/04/20 0810 .  torsemide (DEMADEX) tablet 100 mg, 100 mg, Oral, Daily, Candiss Norse, Harmeet, MD .  traZODone (DESYREL) tablet 25 mg, 25 mg, Oral, QHS PRN, Clarnce Flock, MD   Physical exam:  Vitals:   09/04/20 0930 09/04/20 0945  09/04/20 1000 09/04/20 1015  BP: 121/73 127/69 127/69 127/76  Pulse: 71 62 67 65  Resp: 12 17 11 10   Temp:      TempSrc:      SpO2:      Weight:      Height:       Physical Exam Constitutional:      General: He is not in acute distress.    Appearance: He is not diaphoretic.  HENT:     Head: Normocephalic and atraumatic.     Nose: Nose normal.     Mouth/Throat:  Pharynx: No oropharyngeal exudate.  Eyes:     General: No scleral icterus.    Pupils: Pupils are equal, round, and reactive to light.  Cardiovascular:     Rate and Rhythm: Normal rate and regular rhythm.     Heart sounds: No murmur heard.   Pulmonary:     Effort: Pulmonary effort is normal. No respiratory distress.     Breath sounds: No rales.  Chest:     Chest wall: No tenderness.  Abdominal:     General: There is no distension.     Palpations: Abdomen is soft.     Tenderness: There is no abdominal tenderness.  Musculoskeletal:        General: Normal range of motion.     Cervical back: Normal range of motion and neck supple.  Skin:    General: Skin is warm and dry.     Findings: No erythema.  Neurological:     Mental Status: He is alert and oriented to person, place, and time.     Cranial Nerves: No cranial nerve deficit.     Motor: No abnormal muscle tone.     Coordination: Coordination normal.  Psychiatric:        Mood and Affect: Affect normal.        CMP Latest Ref Rng & Units 09/04/2020  Glucose 70 - 99 mg/dL 92  BUN 8 - 23 mg/dL 47(H)  Creatinine 0.61 - 1.24 mg/dL 7.98(H)  Sodium 135 - 145 mmol/L 132(L)  Potassium 3.5 - 5.1 mmol/L 4.4  Chloride 98 - 111 mmol/L 95(L)  CO2 22 - 32 mmol/L 28  Calcium 8.9 - 10.3 mg/dL 10.9(H)  Total Protein 6.5 - 8.1 g/dL -  Total Bilirubin 0.3 - 1.2 mg/dL -  Alkaline Phos 38 - 126 U/L -  AST 15 - 41 U/L -  ALT 0 - 44 U/L -   CBC Latest Ref Rng & Units 09/04/2020  WBC 4.0 - 10.5 K/uL 7.0  Hemoglobin 13.0 - 17.0 g/dL 12.2(L)  Hematocrit 39.0 - 52.0 %  36.6(L)  Platelets 150 - 400 K/uL 118(L)    RADIOGRAPHIC STUDIES: I have personally reviewed the radiological images as listed and agreed with the findings in the report. DG Chest 2 View  Result Date: 09/01/2020 CLINICAL DATA:  Cough with weakness 1 week. EXAM: CHEST - 2 VIEW COMPARISON:  08/30/2020 and 06/05/2020 as well as CT chest 08/30/2020 FINDINGS: Lungs are adequately inflated with slight worsening hazy density over the left base/retrocardiac region with blunting of the left costophrenic angle as findings may be due to effusion/atelectasis versus infection. Subtle hazy prominence of the perihilar vessels unchanged. Stable cardiomegaly. Remainder of the exam is unchanged. IMPRESSION: 1. Slight worsening hazy density over the left base/retrocardiac region which may be due to effusion/atelectasis versus infection. 2. Stable cardiomegaly with suggestion of minimal vascular congestion. Electronically Signed   By: Marin Olp M.D.   On: 09/01/2020 12:31   DG Chest 2 View  Result Date: 08/30/2020 CLINICAL DATA:  Cough and weakness EXAM: CHEST - 2 VIEW COMPARISON:  June 05, 2020 FINDINGS: There is airspace opacity in portions of each mid lung and left lower lobe with left pleural effusion. There is cardiomegaly with pulmonary vascularity within normal limits. No adenopathy. No bone lesions. IMPRESSION: Airspace opacity consistent with pneumonia in the left lower lung region as well as in each mid lung region. Check of COVID-19 status may be prudent in this circumstance. Left pleural effusion noted.  Stable cardiomegaly. Electronically  Signed   By: Lowella Grip III M.D.   On: 08/30/2020 18:02   CT Chest Wo Contrast  Result Date: 08/30/2020 CLINICAL DATA:  Pneumonia, effusion or abscess suspected, xray done Generalized weakness for 2 weeks. Radiologic records demonstrates history of lung cancer. EXAM: CT CHEST WITHOUT CONTRAST TECHNIQUE: Multidetector CT imaging of the chest was performed  following the standard protocol without IV contrast. COMPARISON:  Radiograph earlier today. Staging chest CT 8 days ago 08/21/2020 FINDINGS: Cardiovascular: Aortic atherosclerosis and tortuosity. Focal outpouching of the left lateral wall of thoracic aorta on prior exam is grossly stable, but not well assessed on the current exam in the absence of IV contrast. No periaortic stranding. Multi chamber cardiomegaly. Coronary artery calcifications. Small pericardial effusion is similar Mediastinum/Nodes: Patient had recent staging CT with IV contrast the. Right paratracheal lymph node measuring right lower paratracheal node measuring 2.3 cm, series 2, image 50, unchanged allowing for differences in technique and caliper placement. Subcarinal node measures 2.1 cm, series 2, image 71, previously 1.9 cm. Additional smaller lymph nodes are unchanged from prior exam. Hilar adenopathy is not well assessed on this noncontrast exam. Left epicardial node measures 1.2 cm, series 2, image 139, previously 1.1 cm. No obvious no adenopathy in the interim. Decompressed esophagus. Lungs/Pleura: Partially loculated left pleural effusion at the lung base this is not significantly changed in size. There is left lower lobe bronchial thickening. No definite acute airspace disease. There is mild scarring abutting the inter lobar fissure on the left which appears similar to prior exam. Spiculated left upper lobe pulmonary nodule measures 2.5 x 2 cm, series 3, image 27, unchanged. Spiculations extend to the pleural surface anterior laterally. Background emphysema. Patchy ground-glass opacity in the perifissural right upper lobe, series 3, image 66, with adjacent fissural thickening, progressed/new. Additional a ground-glass opacity in the perihilar right lower lobe. Slight basilar septal thickening. There is a trace right pleural effusion and seen on prior exam. Upper Abdomen: Bilateral renal parenchymal atrophy. Diverticulosis of the included  colon. No acute upper abdominal findings. Musculoskeletal: There are no acute or suspicious osseous abnormalities. IMPRESSION: 1. Patchy ground-glass opacity in the perifissural right upper and to a lesser extent right lower lobe. Differential considerations include pneumonitis including COVID-19 versus pulmonary edema. Small right pleural effusion is new from CT 1 week ago. 2. Unchanged partially loculated left pleural effusion at the lung base. 3. Unchanged spiculated left upper lobe pulmonary nodule. Unchanged mediastinal adenopathy. Patient recently underwent staging CT. 4. Cardiomegaly with coronary artery calcifications. Small pericardial effusion is unchanged. Aortic Atherosclerosis (ICD10-I70.0) and Emphysema (ICD10-J43.9). Electronically Signed   By: Keith Rake M.D.   On: 08/30/2020 20:01   CT CHEST ABDOMEN PELVIS W CONTRAST  Result Date: 08/22/2020 CLINICAL DATA:  Restaging of left lung adenocarcinoma with malignant pleural effusion. Immunotherapy treatment with Keytruda. EXAM: CT CHEST, ABDOMEN, AND PELVIS WITH CONTRAST TECHNIQUE: Multidetector CT imaging of the chest, abdomen and pelvis was performed following the standard protocol during bolus administration of intravenous contrast. CONTRAST:  13mL OMNIPAQUE IOHEXOL 300 MG/ML  SOLN COMPARISON:  Multiple exams, including 04/30/2020 FINDINGS: CT CHEST FINDINGS Cardiovascular: Coronary, aortic arch, and branch vessel atherosclerotic vascular disease. Mild cardiomegaly. Small pericardial effusion. 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 Focal outpouching of the left lateral wall of the thoracic aorta on image 26 series 2 suggesting a chronic small saccular aneurysm. Chronic web like filling defect / irregularity in the left  lower lobe pulmonary artery on image 86 series 5, no change from prior, likely residuum from remote pulmonary embolus. Mediastinum/Nodes: Right  paratracheal lymph node 2.4 cm in short axis on image 21 series 2, formerly 1.5 cm. Right paratracheal node 0.8 cm in short axis on image 11 series 2, previously 0.4 cm. Subcarinal node 1.9 cm in short axis on image 30 series 2, formerly 1.3 cm. Left infrahilar node 1.2 cm in short axis on image 29 series 2, formerly 0.7 cm. Left anterior pericardial lymph node 1.1 cm in short axis on image 54 series 2, previously 1.5 cm. Lungs/Pleura: Small left pleural effusion with suspected associated mild enhancement. Spiculated left upper lobe pulmonary nodule 2.5 by 2.0 cm on image 24 series 4, previously 2.3 by 1.8 cm when measured in a similar fashion. Centrilobular and paraseptal emphysema. Stable scarring along the minor fissure. Stable scarring in the posterior basal segment right lower lobe. Calcified granuloma posteriorly in the right upper lobe. Increased bilobed pleural nodularity anteriorly along the left upper lobe measuring 0.8 by 0.3 cm on image 40 series 4. Musculoskeletal: Dextroconvex thoracic scoliosis. CT ABDOMEN PELVIS FINDINGS Hepatobiliary: Unremarkable Pancreas: Unremarkable Spleen: Unremarkable Adrenals/Urinary Tract: Indistinct fullness of both adrenal glands partially blurred by motion artifact. Bilateral renal atrophy. Left mid kidney cyst. Thickened right posterior urinary bladder wall, no change from prior, sessile tumor not excluded. Stomach/Bowel: Descending and sigmoid colon diverticulosis. A margin of small bowel extends into a small direct right inguinal hernia without complicating feature. Vascular/Lymphatic: Aortoiliac atherosclerotic vascular disease. Fusiform infrarenal abdominal aortic aneurysm 3.4 cm in diameter, previously measured at 3.3 cm. Right gastric lymph node 1.1 cm in short axis on image 61 series 2, formerly not well seen. Scattered upper normal sized left periaortic lymph nodes. Reproductive: Brachytherapy seed implants in the prostate gland. As before, gas density from the  anorectal region extends further anteriorly into the vicinity of the prostate that would otherwise be expected. Other: No supplemental non-categorized findings. Musculoskeletal: There is a suggestion of minimal bilateral chronic hip avascular necrosis with sclerosis anteriorly in both femoral heads. Lumbar spondylosis and degenerative disc disease causing bilateral foraminal impingement at L5-S1. IMPRESSION: 1. Enlarging left upper lobe nodule and worsening adenopathy in the chest and upper abdomen, compatible with progressive malignancy. 2. Infrarenal abdominal aortic aneurysm 3.4 cm in diameter. Recommend follow-up ultrasound every 3 years. This recommendation follows ACR consensus guidelines: White Paper of the ACR Incidental Findings Committee II on Vascular Findings. J Am Coll Radiol 2013; 10:789-794. 3. Considerable atherosclerosis with chronic stable findings in the chest including a small focal dissection at the origin of the left common carotid artery, and an outpouching of the left lateral side of the descending thoracic aorta potentially from saccular aneurysm or chronic pseudoaneurysm. There is also a web like residuum from prior remote pulmonary embolus in the left lower lobe. No acute pulmonary embolus. 4. Complex small left pleural effusion, likely malignant. 5. Thickened right posterior urinary bladder wall, stable, sessile tumor not excluded. 6. Stable gas density along the anterior anorectal margin extending into the vicinity the prostate gland, significance uncertain. Brachytherapy seeds are present in the prostate gland. 7. Other imaging findings of potential clinical significance: Aortic Atherosclerosis (ICD10-I70.0). Coronary atherosclerosis. Mild cardiomegaly. Emphysema (ICD10-J43.9). Descending and sigmoid colon diverticulosis. Chronic bilateral hip avascular necrosis (mild). Bilateral foraminal impingement at L5-S1. Small direct right inguinal hernia appears stable and contains a small loop  of small bowel without complicating feature. Electronically Signed   By: Cindra Eves.D.  On: 08/22/2020 09:49    Assessment and plan-  #ESRD, volume overload.  Managed by nephrology #Fatigue and weakness.    Continue Synthroid 50 MCG daily for subclinical hypothyroidism.   #Metastatic lung cancer progressed on first line treatment with Keytruda. If he is able to recover to his baseline, the plan is to proceed with single agent Taxol.  Patient understands that his condition is not curable and if he is not able to recover to the condition to be acceptable for additional treatment, comfort care/hospice is reasonable. Patient has poor family support. Palliative care following. PT OT for evaluation of deconditioning and need of rehab  Thank you for allowing me to participate in the care of this patient.   Earlie Server, MD, PhD Hematology Oncology [facility] at [facility] Pager- [phone] 09/04/2020

## 2020-09-05 ENCOUNTER — Telehealth: Payer: Self-pay | Admitting: Nurse Practitioner

## 2020-09-05 NOTE — Telephone Encounter (Signed)
Called patient to offer to schedule a Palliative f/u visit, and his number was not in service.  I then called patient's sister, [name], with no answer and unable to leave a message.

## 2020-09-05 NOTE — Telephone Encounter (Signed)
Spoke with patient's sister, [name], to see if patient had a different phone number because the number for him said that it was not in service and she said that the patient was admitted to [facility] yesterday.  I have notified Palliative NP and Admin Team.

## 2020-09-07 ENCOUNTER — Other Ambulatory Visit: Payer: Self-pay

## 2020-09-07 ENCOUNTER — Inpatient Hospital Stay: Payer: Medicare Other | Admitting: Oncology

## 2020-09-07 ENCOUNTER — Non-Acute Institutional Stay: Payer: Medicare Other | Admitting: Nurse Practitioner

## 2020-09-07 ENCOUNTER — Encounter: Payer: Self-pay | Admitting: Nurse Practitioner

## 2020-09-07 VITALS — BP 141/86 | HR 78 | Temp 97.6°F | Resp 18 | Wt 131.8 lb

## 2020-09-07 DIAGNOSIS — C3412 Malignant neoplasm of upper lobe, left bronchus or lung: Secondary | ICD-10-CM

## 2020-09-07 DIAGNOSIS — R531 Weakness: Secondary | ICD-10-CM

## 2020-09-07 DIAGNOSIS — Z515 Encounter for palliative care: Secondary | ICD-10-CM

## 2020-09-07 DIAGNOSIS — E43 Unspecified severe protein-calorie malnutrition: Secondary | ICD-10-CM

## 2020-09-07 NOTE — Progress Notes (Signed)
[facility] Consult Note Telephone: [phone]  Fax: [phone]  PATIENT NAME: Jose [name] DOB: 1945/09/10 MRN: [id number]  PRIMARY CARE PROVIDER:   [name], MD  REFERRING PROVIDER: Dr [facility] RESPONSIBLE PARTY:   Self/ [name] son [phone] or [phone]  I was asked to see Jose [name] for Palliative care consult by Dr Reesa Chew for complex medical decision making  1.Advance Care Planning; Full code with aggressive interventions  2. Goals of Care: Goals include to maximize quality of life and symptom management. Our advance care planning conversation included a discussion about:   The value and importance of advance care planning  Exploration of personal, cultural or spiritual beliefs that might influence medical decisions  Exploration of goals of care in the event of a sudden injury or illness  Identification and preparation of a healthcare agent  Review and updating or creation of anadvance directive document.  3.Palliative care encounter; Palliative care encounter; Palliative medicine team will continue to support patient, patient's family, and medical team. Visit consisted of counseling and education dealing with the complex and emotionally intense issues of symptom management and palliative care in the setting of serious and potentially life-threatening illness  4. f/u 1 weeks or sooner if symptoms worsen  5. Generalized weakness continue to work with therapy strengthening, balance, fall risk  6. Protein calorie malnutrition; continue to encourage to eat, nutritional education, supplements, weights  I spent 45 minutes providing this consultation, started at 10:30am. More than 50% of the time in this consultation was spent coordinating communication.   HISTORY OF PRESENT ILLNESS:  Jose [name] is a 75 y.o. year old male with multiple medical problems including  lung  cancer, ESRD on HD T,TH,Sat, HTN, HLD, DMT2, gout, depression, CHF, h/o CVA and prostate cancer. Palliative Care was asked to help address goals of care. 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 Discontinued atorvastatin due to generalized weakness, haic 6.3. Palliative consulted during hospitalization continue smoke cigarettes. Wishes are to continue chemotherapy. Discharged to short term rehab at [facility] where he currently resides. Jose [name] per staff does require assistance for transfers, mobility due to generalized weakness, bathing, dressing, toileting. Jose [name] does feed himself with declined appetite. Staff endorses he is trying to participate in therapy, slow to progress. Jose [name] continues to go to Cancer center for treatments in addition to dialysis three times a week. Staff endorses no there changes or concerns. At present, Jose [name] is lying in bed in his room at [facility] sleeping. Jose [name] awoke to verbal cues. Jose [name] does make eye contact, interactive. We talked about purpose of PC visit. Jose [name] was in agreement. We talked about how he has been feeling today. Jose [name] endorses he is tired overall, a lot to do. We talked about telemedicine PC visit we had prior to hospitalization, refreshed visit. We talked about life review, past medical history, recent hospitalization. We talked about symptoms of pain, shortness of breath which Jose [name] currently denies. We talked about his appetite being declined. Jose [name] was cooperative with assessment. We talked about Jose [name] generalized  weakness, feeling tired. We talked about medical goals of  care with cancer briefly, full scope of treatment, dialysis. We talked about [facility], goals with therapy and hopes of d/c home. We talked about role PC in POC. Will contact Jose [name] son with update on PC visit, further discussion goc, d/c plans. Jose [name] in agreement, discussed will f/u 1 week to monitor progress, appetite, ongoing therapy, chronic disease management. I updated nursing staff.   Jose [name] with son lives near, daughter less involved.retired from [employer].  Palliative Care was asked to help to continue to address goals of care.   ROS: 14+system reviewed all negative except HPI PFSH: reviewed see HPI  CODE STATUS: full code  PPS: 40% HOSPICE ELIGIBILITY/DIAGNOSIS: TBD  PAST MEDICAL HISTORY:  Past Medical History:  Diagnosis Date  . Chronic kidney disease   . Depression   . Diabetes mellitus without complication ([place])   . Glaucoma   . Glaucoma   . Gout   . Heart murmur   . HLD (hyperlipidemia)   . Hypertension   . Primary adenocarcinoma of left lung ([place])   . Prostate cancer ([place])   . Renal failure    dialysis t/t/s  . Renal insufficiency   . Stroke ([place])    tia x 2    SOCIAL HX:  Social History   Tobacco Use  . Smoking status: Current Some Day Smoker    Packs/day: 0.10    Types: Cigarettes  . Smokeless tobacco: Never Used  . Tobacco comment: pt currently smokes about 3 cigarettes a day   Substance Use Topics  . Alcohol use: Not Currently    Alcohol/week: 0.0 standard drinks    ALLERGIES:  Allergies  Allergen Reactions  . Penicillin G Other (See Comments)    Other reaction(s): Unknown DID THE REACTION INVOLVE: Swelling of the face/tongue/throat, SOB, or low BP? Unknown Sudden or severe rash/hives, skin peeling, or the inside of the mouth or nose? Unknown Did it require medical treatment? Unknown When did it last happen?unknown If all above answers are "NO", may proceed with  cephalosporin use.      PHYSICAL EXAM:   General: NAD, frail appearing, thin, debilitated male Cardiovascular: regular rate and rhythm Pulmonary: clear ant fields Extremities: no edema, no joint deformities; muscle wasting Neurological: generalized weakness  Tannia Contino Ihor Gully, NP

## 2020-09-10 ENCOUNTER — Inpatient Hospital Stay: Payer: Medicare Other | Admitting: Hospice and Palliative Medicine

## 2020-09-11 ENCOUNTER — Other Ambulatory Visit: Payer: Self-pay

## 2020-09-11 DIAGNOSIS — C3492 Malignant neoplasm of unspecified part of left bronchus or lung: Secondary | ICD-10-CM

## 2020-09-12 ENCOUNTER — Inpatient Hospital Stay: Payer: Medicare Other

## 2020-09-12 ENCOUNTER — Inpatient Hospital Stay: Payer: Medicare Other | Admitting: Hospice and Palliative Medicine

## 2020-09-12 ENCOUNTER — Inpatient Hospital Stay: Payer: Medicare Other | Admitting: Oncology

## 2020-09-17 ENCOUNTER — Other Ambulatory Visit: Payer: Self-pay

## 2020-09-17 ENCOUNTER — Encounter: Payer: Self-pay | Admitting: Nurse Practitioner

## 2020-09-17 ENCOUNTER — Non-Acute Institutional Stay: Payer: Medicare Other | Admitting: Nurse Practitioner

## 2020-09-17 VITALS — BP 121/68 | HR 62 | Temp 98.5°F | Resp 18 | Wt 131.8 lb

## 2020-09-17 DIAGNOSIS — C3412 Malignant neoplasm of upper lobe, left bronchus or lung: Secondary | ICD-10-CM

## 2020-09-17 DIAGNOSIS — R531 Weakness: Secondary | ICD-10-CM

## 2020-09-17 DIAGNOSIS — E43 Unspecified severe protein-calorie malnutrition: Secondary | ICD-10-CM

## 2020-09-17 DIAGNOSIS — Z515 Encounter for palliative care: Secondary | ICD-10-CM

## 2020-09-17 NOTE — Progress Notes (Signed)
[facility] Consult Note Telephone: [phone]  Fax: [phone]    Date of encounter: 09/17/20 PATIENT NAME: Jose [name] [id number]   [phone] (home)  DOB: Apr 18, 1946 MRN: [id number] PRIMARY CARE PROVIDER:   Dr [facility] [name], MD,  [address] [place] [address] [phone]  RESPONSIBLE PARTY:    Contact Information    Name Relation Home Work [address]   [name] Son [phone]  [name], [name]   [phone]   [name], [name]   [phone]     I met face to face with patient in facility. Palliative Care was asked to follow this patient by consultation request of Dr Reesa Chew to address advance care planning and complex medical decision making. This is a follow up visit  ASSESSMENT AND PLAN / RECOMMENDATIONS:   Advance Care Planning/Goals of Care: Goals include to maximize quality of life and symptom management. Our advance care planning conversation included a discussion about:     The value and importance of advance care planning   Experiences with loved ones who have been seriously ill or have died   Exploration of personal, cultural or spiritual beliefs that might influence medical decisions   Exploration of goals of care in the event of a sudden injury or illness   Identification and preparation of a healthcare agent   Review and updating or creation of an  advance directive document .  Decision not to resuscitate or to de-escalate disease focused treatments due to poor prognosis.  CODE STATUS: Full code  Symptom Management/Plan: 1.Advance Care Planning;Full code with aggressive interventions  2. Goals of Care: Goals include to maximize quality of life and symptom management. Our advance care planning conversation included a discussion about:   The value and importance of advance care  planning  Exploration of personal, cultural or spiritual beliefs that might influence medical decisions  Exploration of goals of care in the event of a sudden injury or illness  Identification and preparation of a healthcare agent  Review and updating or creation of anadvance directive document.  3. Generalized weakness continue to work with therapy strengthening, balance, fall risk  4. Protein calorie malnutrition; continue to encourage to eat, nutritional education, supplements, weights  5. Malignant neoplasm; continue with Oncology plan. Dr. Tasia Catchings saw on 08/31/2020 Stage IV lung adenocarcinoma with malignant pleural effusion. Received Keytruda today. CT chest abdomen pelvis results were available after his last visit. Images were independently reviewed by me.  Enlarging left upper lobe nodule and worsening adenopathy in the chest, upper abdomen.  Compatible with progressive malignancy.  Infra abdominal aortic aneurysm 3.4 cm.  Atherosclerosis.  Complex small left pleural effusion. Thickened right posterior urinary bladder wall, stable, Stable gas density along the anterior anorectal margin extending into the vicinity the prostate gland, significance uncertain.Brachytherapy seeds are present in the prostate gland. Need of chemotherapy carboplatin Taxol. Given his overall performance status, multiple other medical problems,  switched to single agent carboplatin  regimen any further titrate chemotherapy regimen if he tolerates.   6.Palliative care encounter; Palliative care encounter; Palliative medicine team will continue to support patient, patient's family, and medical team. Visit consisted of counseling and education dealing with the complex and emotionally intense issues of symptom management and palliative care in the setting of serious and potentially life-threatening illness  Follow up Palliative Care Visit: Palliative care will continue to follow for complex  medical decision making,  advance care planning, and clarification of goals. Return 1 weeks or prn.  I spent 35 minutes providing this consultation starting at 10:45am. More than 50% of the time in this consultation was spent in counseling and care coordination.  PPS: 30%  HOSPICE ELIGIBILITY/DIAGNOSIS: TBD  Chief Complaint: Follow-up Palliative care visit for complex medical decision making  HISTORY OF PRESENT ILLNESS:  Jose [name] is a 75 y.o. 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 Palliative Care was asked to help address goals of care. 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 Discontinued atorvastatin due to generalized weakness. Discharged to short term rehab at [facility] where he currently resides. [name] per staff does require assistance for transfers, mobility due to generalized weakness, bathing, dressing, toileting. [name] does feed himself with declined appetite. 09/11/2020 unwitnessed fall. 09/16/2020 Mild patchy bilateral densities left greater than right compatible with pneumonia pulmonary edema, mild cardiomegaly, mild osteopenia, mild osteoarthritis. Proceeded with Lasix 29m IM for three days with order to repeat chest X ray. Staff endorses no other changes. At present, [name] [facility] appears weak, thin, pleasant. [name] not appear to be in distress. I visit and observed [name]. We talked about purpose of PC visit. Mr. TPfiesterdid make eye contact. Mr. TCulbreathanswered questions, denies  pain or dyspnea. We talked about weakness. Mr. TConoverendorses he is not hungry. Encouraged [name] to eat. We talked about food that [name] likes to eat. Mr. TBorjonwas cooperative with assessment. Mr. TKlothendorses he is sleeping. We talked about therapy briefly, Mr. Done wanted to sleep verbalized he was tired. Emotional support provided. I have attempted to contact Mr. Florance son, AMinna Merrittsmessage left to return call for further discussions of goc, d/c planning, complex medical decision making. I updated staff no changes at present time until further discussion.  History obtained from review of EMR, discussion with primary team, and interview with  Mr. Guster. I  reviewed available labs, medications, imaging, studies and related documents from the EMR.  Records reviewed and summarized above.   ROS Full 14 system review of systems performed and negative with exception of: as per HPI.  Physical Exam: Constitutional: NAD General: frail appearing, thin, chronically ill, debilitated male EYES: lids intact, no discharge  ENMT: oral mucous membranes moist CV: S1S2, RRR, no LE edema Pulmonary: LCTA, no increased work of breathing, no cough Abdomen: normo-active BS + 4 quadrants, soft and non tender,  GU: deferred MSK: moves all extremities Skin: warm and dry, no rashes or wounds on visible skin Neuro:  + generalized weakness Psych: non-anxious affect, Alert, oriented to self  Questions and concerns were addressed. The patient/family was encouraged to call with questions and/or concerns. Provided general support and encouragement, no other unmet needs identified  Thank you for the opportunity to participate in the care of Mr. Sliwa.  The palliative care team will continue to follow. Please call our office at [phone] we can be of additional assistance.   This chart was dictated using voice recognition  software. Despite best efforts to proofread, errors can occur which  can change the documentation meaning.  Bertie Simien Ihor Gully, NP , MSN, [facility]

## 2020-09-20 ENCOUNTER — Inpatient Hospital Stay: Payer: Medicare Other

## 2020-09-20 ENCOUNTER — Inpatient Hospital Stay
Admission: EM | Admit: 2020-09-20 | Discharge: 2020-09-26 | DRG: 193 | Disposition: A | Discharge status: Hospice | Payer: Medicare Other | Attending: Internal Medicine | Admitting: Internal Medicine

## 2020-09-20 ENCOUNTER — Other Ambulatory Visit: Payer: Self-pay

## 2020-09-20 ENCOUNTER — Emergency Department: Payer: Medicare Other

## 2020-09-20 ENCOUNTER — Inpatient Hospital Stay: Payer: Medicare Other | Admitting: Oncology

## 2020-09-20 DIAGNOSIS — E1142 Type 2 diabetes mellitus with diabetic polyneuropathy: Secondary | ICD-10-CM | POA: Diagnosis present

## 2020-09-20 DIAGNOSIS — F419 Anxiety disorder, unspecified: Secondary | ICD-10-CM | POA: Diagnosis present

## 2020-09-20 DIAGNOSIS — J910 Malignant pleural effusion: Secondary | ICD-10-CM | POA: Diagnosis present

## 2020-09-20 DIAGNOSIS — I2510 Atherosclerotic heart disease of native coronary artery without angina pectoris: Secondary | ICD-10-CM | POA: Diagnosis present

## 2020-09-20 DIAGNOSIS — F32A Depression, unspecified: Secondary | ICD-10-CM | POA: Diagnosis not present

## 2020-09-20 DIAGNOSIS — R338 Other retention of urine: Secondary | ICD-10-CM | POA: Diagnosis present

## 2020-09-20 DIAGNOSIS — E1122 Type 2 diabetes mellitus with diabetic chronic kidney disease: Secondary | ICD-10-CM | POA: Diagnosis present

## 2020-09-20 DIAGNOSIS — N2581 Secondary hyperparathyroidism of renal origin: Secondary | ICD-10-CM | POA: Diagnosis present

## 2020-09-20 DIAGNOSIS — E785 Hyperlipidemia, unspecified: Secondary | ICD-10-CM | POA: Diagnosis present

## 2020-09-20 DIAGNOSIS — E440 Moderate protein-calorie malnutrition: Secondary | ICD-10-CM | POA: Diagnosis present

## 2020-09-20 DIAGNOSIS — E1169 Type 2 diabetes mellitus with other specified complication: Secondary | ICD-10-CM | POA: Diagnosis present

## 2020-09-20 DIAGNOSIS — Z681 Body mass index (BMI) 19 or less, adult: Secondary | ICD-10-CM | POA: Diagnosis not present

## 2020-09-20 DIAGNOSIS — C3490 Malignant neoplasm of unspecified part of unspecified bronchus or lung: Secondary | ICD-10-CM

## 2020-09-20 DIAGNOSIS — N186 End stage renal disease: Secondary | ICD-10-CM | POA: Diagnosis present

## 2020-09-20 DIAGNOSIS — I132 Hypertensive heart and chronic kidney disease with heart failure and with stage 5 chronic kidney disease, or end stage renal disease: Secondary | ICD-10-CM | POA: Diagnosis present

## 2020-09-20 DIAGNOSIS — Z992 Dependence on renal dialysis: Secondary | ICD-10-CM | POA: Diagnosis not present

## 2020-09-20 DIAGNOSIS — Z7982 Long term (current) use of aspirin: Secondary | ICD-10-CM

## 2020-09-20 DIAGNOSIS — G9341 Metabolic encephalopathy: Secondary | ICD-10-CM | POA: Diagnosis present

## 2020-09-20 DIAGNOSIS — Z515 Encounter for palliative care: Secondary | ICD-10-CM

## 2020-09-20 DIAGNOSIS — Z7902 Long term (current) use of antithrombotics/antiplatelets: Secondary | ICD-10-CM

## 2020-09-20 DIAGNOSIS — Z66 Do not resuscitate: Secondary | ICD-10-CM | POA: Diagnosis not present

## 2020-09-20 DIAGNOSIS — C3412 Malignant neoplasm of upper lobe, left bronchus or lung: Secondary | ICD-10-CM | POA: Diagnosis present

## 2020-09-20 DIAGNOSIS — Z8546 Personal history of malignant neoplasm of prostate: Secondary | ICD-10-CM

## 2020-09-20 DIAGNOSIS — R64 Cachexia: Secondary | ICD-10-CM | POA: Diagnosis present

## 2020-09-20 DIAGNOSIS — N401 Enlarged prostate with lower urinary tract symptoms: Secondary | ICD-10-CM | POA: Diagnosis present

## 2020-09-20 DIAGNOSIS — Z880 Allergy status to penicillin: Secondary | ICD-10-CM

## 2020-09-20 DIAGNOSIS — E1165 Type 2 diabetes mellitus with hyperglycemia: Secondary | ICD-10-CM | POA: Diagnosis present

## 2020-09-20 DIAGNOSIS — E039 Hypothyroidism, unspecified: Secondary | ICD-10-CM | POA: Diagnosis not present

## 2020-09-20 DIAGNOSIS — J189 Pneumonia, unspecified organism: Secondary | ICD-10-CM | POA: Diagnosis present

## 2020-09-20 DIAGNOSIS — R531 Weakness: Secondary | ICD-10-CM | POA: Diagnosis not present

## 2020-09-20 DIAGNOSIS — C7800 Secondary malignant neoplasm of unspecified lung: Secondary | ICD-10-CM | POA: Diagnosis present

## 2020-09-20 DIAGNOSIS — Z20822 Contact with and (suspected) exposure to covid-19: Secondary | ICD-10-CM | POA: Diagnosis present

## 2020-09-20 DIAGNOSIS — H409 Unspecified glaucoma: Secondary | ICD-10-CM | POA: Diagnosis present

## 2020-09-20 DIAGNOSIS — E8770 Fluid overload, unspecified: Secondary | ICD-10-CM | POA: Diagnosis present

## 2020-09-20 DIAGNOSIS — E43 Unspecified severe protein-calorie malnutrition: Secondary | ICD-10-CM | POA: Diagnosis present

## 2020-09-20 DIAGNOSIS — Z7189 Other specified counseling: Secondary | ICD-10-CM | POA: Diagnosis not present

## 2020-09-20 DIAGNOSIS — R54 Age-related physical debility: Secondary | ICD-10-CM | POA: Diagnosis present

## 2020-09-20 DIAGNOSIS — F17210 Nicotine dependence, cigarettes, uncomplicated: Secondary | ICD-10-CM | POA: Diagnosis present

## 2020-09-20 DIAGNOSIS — R4182 Altered mental status, unspecified: Secondary | ICD-10-CM | POA: Diagnosis not present

## 2020-09-20 DIAGNOSIS — I5032 Chronic diastolic (congestive) heart failure: Secondary | ICD-10-CM | POA: Diagnosis present

## 2020-09-20 DIAGNOSIS — C782 Secondary malignant neoplasm of pleura: Secondary | ICD-10-CM | POA: Diagnosis present

## 2020-09-20 DIAGNOSIS — E875 Hyperkalemia: Secondary | ICD-10-CM | POA: Diagnosis not present

## 2020-09-20 DIAGNOSIS — M109 Gout, unspecified: Secondary | ICD-10-CM | POA: Diagnosis present

## 2020-09-20 DIAGNOSIS — D631 Anemia in chronic kidney disease: Secondary | ICD-10-CM | POA: Diagnosis present

## 2020-09-20 DIAGNOSIS — R6889 Other general symptoms and signs: Secondary | ICD-10-CM | POA: Diagnosis present

## 2020-09-20 DIAGNOSIS — Z8673 Personal history of transient ischemic attack (TIA), and cerebral infarction without residual deficits: Secondary | ICD-10-CM

## 2020-09-20 DIAGNOSIS — Z79899 Other long term (current) drug therapy: Secondary | ICD-10-CM

## 2020-09-20 DIAGNOSIS — Y95 Nosocomial condition: Secondary | ICD-10-CM | POA: Diagnosis present

## 2020-09-20 DIAGNOSIS — C3492 Malignant neoplasm of unspecified part of left bronchus or lung: Secondary | ICD-10-CM | POA: Diagnosis not present

## 2020-09-20 LAB — CBC WITH DIFFERENTIAL/PLATELET
Abs Immature Granulocytes: 0.03 10*3/uL (ref 0.00–0.07)
Basophils Absolute: 0.1 10*3/uL (ref 0.0–0.1)
Basophils Relative: 1 %
Eosinophils Absolute: 0.1 10*3/uL (ref 0.0–0.5)
Eosinophils Relative: 2 %
HCT: 39.7 % (ref 39.0–52.0)
Hemoglobin: 13 g/dL (ref 13.0–17.0)
Immature Granulocytes: 1 %
Lymphocytes Relative: 16 %
Lymphs Abs: 1 10*3/uL (ref 0.7–4.0)
MCH: 32.3 pg (ref 26.0–34.0)
MCHC: 32.7 g/dL (ref 30.0–36.0)
MCV: 98.5 fL (ref 80.0–100.0)
Monocytes Absolute: 0.7 10*3/uL (ref 0.1–1.0)
Monocytes Relative: 11 %
Neutro Abs: 4.6 10*3/uL (ref 1.7–7.7)
Neutrophils Relative %: 69 %
Platelets: 127 10*3/uL — ABNORMAL LOW (ref 150–400)
RBC: 4.03 MIL/uL — ABNORMAL LOW (ref 4.22–5.81)
RDW: 15.2 % (ref 11.5–15.5)
WBC: 6.5 10*3/uL (ref 4.0–10.5)
nRBC: 0 % (ref 0.0–0.2)

## 2020-09-20 LAB — COMPREHENSIVE METABOLIC PANEL
ALT: 12 U/L (ref 0–44)
AST: 27 U/L (ref 15–41)
Albumin: 3 g/dL — ABNORMAL LOW (ref 3.5–5.0)
Alkaline Phosphatase: 29 U/L — ABNORMAL LOW (ref 38–126)
Anion gap: 11 (ref 5–15)
BUN: 29 mg/dL — ABNORMAL HIGH (ref 8–23)
CO2: 27 mmol/L (ref 22–32)
Calcium: 11.1 mg/dL — ABNORMAL HIGH (ref 8.9–10.3)
Chloride: 95 mmol/L — ABNORMAL LOW (ref 98–111)
Creatinine, Ser: 5.73 mg/dL — ABNORMAL HIGH (ref 0.61–1.24)
GFR, Estimated: 10 mL/min — ABNORMAL LOW (ref >60)
Glucose, Bld: 103 mg/dL — ABNORMAL HIGH (ref 70–99)
Potassium: 5.2 mmol/L — ABNORMAL HIGH (ref 3.5–5.1)
Sodium: 133 mmol/L — ABNORMAL LOW (ref 135–145)
Total Bilirubin: 1.1 mg/dL (ref 0.3–1.2)
Total Protein: 6.5 g/dL (ref 6.5–8.1)

## 2020-09-20 LAB — HIV ANTIBODY (ROUTINE TESTING W REFLEX): HIV Screen 4th Generation wRfx: NONREACTIVE

## 2020-09-20 LAB — CBC
HCT: 43.1 % (ref 39.0–52.0)
Hemoglobin: 13.8 g/dL (ref 13.0–17.0)
MCH: 32 pg (ref 26.0–34.0)
MCHC: 32 g/dL (ref 30.0–36.0)
MCV: 100 fL (ref 80.0–100.0)
Platelets: 125 10*3/uL — ABNORMAL LOW (ref 150–400)
RBC: 4.31 MIL/uL (ref 4.22–5.81)
RDW: 15.2 % (ref 11.5–15.5)
WBC: 7.6 10*3/uL (ref 4.0–10.5)
nRBC: 0 % (ref 0.0–0.2)

## 2020-09-20 LAB — RESP PANEL BY RT-PCR (FLU A&B, COVID) ARPGX2
Influenza A by PCR: NEGATIVE
Influenza B by PCR: NEGATIVE
SARS Coronavirus 2 by RT PCR: NEGATIVE

## 2020-09-20 LAB — LACTIC ACID, PLASMA
Lactic Acid, Venous: 1.4 mmol/L (ref 0.5–1.9)
Lactic Acid, Venous: 1.5 mmol/L (ref 0.5–1.9)

## 2020-09-20 LAB — ETHANOL: Alcohol, Ethyl (B): <10 mg/dL (ref <10)

## 2020-09-20 LAB — TSH: TSH: 12.172 u[IU]/mL — ABNORMAL HIGH (ref 0.350–4.500)

## 2020-09-20 LAB — TROPONIN I (HIGH SENSITIVITY)
Troponin I (High Sensitivity): 29 ng/L — ABNORMAL HIGH (ref <18)
Troponin I (High Sensitivity): 28 ng/L — ABNORMAL HIGH (ref <18)

## 2020-09-20 MED ORDER — LEVOTHYROXINE SODIUM 25 MCG PO TABS
25.0000 ug | ORAL_TABLET | Freq: Every day | ORAL | Status: DC
  Administered 2020-09-21: 25 ug via ORAL
  Filled 2020-09-20: qty 1

## 2020-09-20 MED ORDER — TORSEMIDE 100 MG PO TABS
100.0000 mg | ORAL_TABLET | Freq: Every day | ORAL | Status: DC
  Administered 2020-09-21: 10:00:00 100 mg via ORAL
  Filled 2020-09-20: qty 1

## 2020-09-20 MED ORDER — SODIUM CHLORIDE 0.9 % IV SOLN: 250.0000 mL | INTRAVENOUS | Status: DC | PRN

## 2020-09-20 MED ORDER — ESCITALOPRAM OXALATE 10 MG PO TABS
10.0000 mg | ORAL_TABLET | Freq: Every day | ORAL | Status: DC
  Administered 2020-09-21: 10 mg via ORAL
  Filled 2020-09-20 (×2): qty 1

## 2020-09-20 MED ORDER — OXYCODONE HCL 5 MG PO TABS
5.0000 mg | ORAL_TABLET | Freq: Four times a day (QID) | ORAL | Status: DC | PRN
Start: 2020-09-20 — End: 2020-09-24

## 2020-09-20 MED ORDER — CHLORHEXIDINE GLUCONATE CLOTH 2 % EX PADS
6.0000 | MEDICATED_PAD | Freq: Every day | CUTANEOUS | Status: DC
  Administered 2020-09-21: 15:00:00 6 via TOPICAL
  Filled 2020-09-20: qty 6

## 2020-09-20 MED ORDER — GABAPENTIN 100 MG PO CAPS
100.0000 mg | ORAL_CAPSULE | Freq: Three times a day (TID) | ORAL | Status: DC
  Administered 2020-09-20 – 2020-09-21 (×2): 100 mg via ORAL
  Filled 2020-09-20 (×2): qty 1

## 2020-09-20 MED ORDER — NICOTINE 21 MG/24HR TD PT24
21.0000 mg | MEDICATED_PATCH | Freq: Every day | TRANSDERMAL | Status: DC
  Administered 2020-09-20 – 2020-09-21 (×2): 21 mg via TRANSDERMAL
  Filled 2020-09-20 (×2): qty 1

## 2020-09-20 MED ORDER — SODIUM CHLORIDE 0.9% FLUSH
3.0000 mL | Freq: Two times a day (BID) | INTRAVENOUS | Status: DC
  Administered 2020-09-21 – 2020-09-26 (×10): 3 mL via INTRAVENOUS

## 2020-09-20 MED ORDER — VANCOMYCIN HCL 1250 MG/250ML IV SOLN
1250.0000 mg | Freq: Once | INTRAVENOUS | Status: AC
  Administered 2020-09-20: 1250 mg via INTRAVENOUS
  Filled 2020-09-20 (×2): qty 250

## 2020-09-20 MED ORDER — RENA-VITE PO TABS
1.0000 | ORAL_TABLET | Freq: Every day | ORAL | Status: DC
  Administered 2020-09-20: 1 via ORAL
  Filled 2020-09-20 (×3): qty 1

## 2020-09-20 MED ORDER — METOPROLOL SUCCINATE ER 25 MG PO TB24
25.0000 mg | ORAL_TABLET | Freq: Every day | ORAL | Status: DC
  Filled 2020-09-20: qty 1

## 2020-09-20 MED ORDER — GUAIFENESIN ER 600 MG PO TB12
600.0000 mg | ORAL_TABLET | Freq: Two times a day (BID) | ORAL | Status: DC
  Administered 2020-09-20 – 2020-09-21 (×2): 600 mg via ORAL
  Filled 2020-09-20 (×2): qty 1

## 2020-09-20 MED ORDER — LEVOFLOXACIN IN D5W 750 MG/150ML IV SOLN: 750.0000 mg | INTRAVENOUS | Status: DC

## 2020-09-20 MED ORDER — NEPRO/CARBSTEADY PO LIQD
237.0000 mL | Freq: Two times a day (BID) | ORAL | Status: DC
  Administered 2020-09-20 – 2020-09-21 (×2): 237 mL via ORAL

## 2020-09-20 MED ORDER — ACETAMINOPHEN 325 MG PO TABS: 650.0000 mg | ORAL_TABLET | Freq: Four times a day (QID) | ORAL | Status: DC | PRN

## 2020-09-20 MED ORDER — SODIUM CHLORIDE 0.9 % IV SOLN
1.0000 g | Freq: Once | INTRAVENOUS | Status: AC
  Administered 2020-09-20: 1 g via INTRAVENOUS
  Filled 2020-09-20: qty 1

## 2020-09-20 MED ORDER — SODIUM CHLORIDE 0.9 % IV SOLN
500.0000 mg | INTRAVENOUS | Status: DC
  Administered 2020-09-20: 500 mg via INTRAVENOUS
  Filled 2020-09-20 (×2): qty 500

## 2020-09-20 MED ORDER — CLOPIDOGREL BISULFATE 75 MG PO TABS
75.0000 mg | ORAL_TABLET | Freq: Every day | ORAL | Status: DC
  Administered 2020-09-21: 75 mg via ORAL
  Filled 2020-09-20: qty 1

## 2020-09-20 MED ORDER — IPRATROPIUM-ALBUTEROL 20-100 MCG/ACT IN AERS
1.0000 | INHALATION_SPRAY | Freq: Four times a day (QID) | RESPIRATORY_TRACT | Status: DC
  Administered 2020-09-20 – 2020-09-21 (×2): 1 via RESPIRATORY_TRACT
  Filled 2020-09-20: qty 4

## 2020-09-20 MED ORDER — SODIUM CHLORIDE 0.9 % IV SOLN
2.0000 g | INTRAVENOUS | Status: DC
  Administered 2020-09-20: 21:00:00 2 g via INTRAVENOUS
  Filled 2020-09-20: qty 20
  Filled 2020-09-20: qty 2

## 2020-09-20 MED ORDER — TAMSULOSIN HCL 0.4 MG PO CAPS
0.4000 mg | ORAL_CAPSULE | Freq: Every day | ORAL | Status: DC
  Administered 2020-09-21: 0.4 mg via ORAL
  Filled 2020-09-20: qty 1

## 2020-09-20 MED ORDER — POLYETHYLENE GLYCOL 3350 17 G PO PACK: 17.0000 g | PACK | Freq: Every day | ORAL | Status: DC | PRN

## 2020-09-20 MED ORDER — ASPIRIN EC 81 MG PO TBEC
81.0000 mg | DELAYED_RELEASE_TABLET | Freq: Every day | ORAL | Status: DC
  Administered 2020-09-21: 81 mg via ORAL
  Filled 2020-09-20: qty 1

## 2020-09-20 MED ORDER — HEPARIN SODIUM (PORCINE) 5000 UNIT/ML IJ SOLN
5000.0000 [IU] | Freq: Three times a day (TID) | INTRAMUSCULAR | Status: DC
  Administered 2020-09-20 – 2020-09-21 (×2): 5000 [IU] via SUBCUTANEOUS
  Filled 2020-09-20 (×2): qty 1

## 2020-09-20 MED ORDER — ONDANSETRON HCL 4 MG PO TABS: 4.0000 mg | ORAL_TABLET | Freq: Four times a day (QID) | ORAL | Status: DC | PRN

## 2020-09-20 MED ORDER — LOSARTAN POTASSIUM 50 MG PO TABS
50.0000 mg | ORAL_TABLET | Freq: Every day | ORAL | Status: DC
  Administered 2020-09-21: 50 mg via ORAL
  Filled 2020-09-20: qty 1

## 2020-09-20 MED ORDER — SODIUM CHLORIDE 0.9% FLUSH: 3.0000 mL | INTRAVENOUS | Status: DC | PRN

## 2020-09-20 MED ORDER — LEVOTHYROXINE SODIUM 50 MCG PO TABS: 50.0000 ug | ORAL_TABLET | Freq: Every day | ORAL | Status: DC

## 2020-09-20 NOTE — H&P (Addendum)
[facility] [place] NAME: Jose [name]    MR#:  [id number]  DATE OF BIRTH:  02/21/1946  DATE OF ADMISSION:  09/20/2020  PRIMARY CARE PHYSICIAN: [name], MD   REQUESTING/REFERRING PHYSICIAN: Duffy Bruce MD  CHIEF COMPLAINT:   Chief Complaint  Patient presents with  . Altered Mental Status    HISTORY OF PRESENT ILLNESS:  Jose [name]  is a 75 y.o. male with a known history of ESRD on hemodialysis, diabetes, hypertension, hyperlipidemia sent to the emergency department from his dialysis place for worsening weakness.  While at dialysis he was reportedly very confused and altered with a similar history in the past when he has any infection.  He did not receive his dialysis today.  While in the ED he was noted to have hyperkalemia and multilobar pneumonia seen on chest x-ray.  Patient still seem to be somewhat altered and talking out of the head. PAST MEDICAL HISTORY:   Past Medical History:  Diagnosis Date  . Chronic kidney disease   . Depression   . Diabetes mellitus without complication ([place])   . Glaucoma   . Glaucoma   . Gout   . Heart murmur   . HLD (hyperlipidemia)   . Hypertension   . Primary adenocarcinoma of left lung ([place])   . Prostate cancer ([place])   . Renal failure    dialysis t/t/s  . Renal insufficiency   . Stroke ([place])    tia x 2   PAST SURGICAL HISTORY:   Past Surgical History:  Procedure Laterality Date  . AMPUTATION TOE Bilateral 07/23/2018   Procedure: TOE MPJ RIGHT 3RD AND LEFT 2ND;  Surgeon: Samara Deist, DPM;  Location: [facility];  Service: Podiatry;  Laterality: Bilateral;  . APPENDECTOMY    . AV FISTULA PLACEMENT Left 06/09/2018   Procedure: ARTERIOVENOUS (AV) FISTULA CREATION ( BRACHIAL CEPHALIC);  Surgeon: Algernon Huxley, MD;  Location: [facility];  Service: Vascular;  Laterality: Left;  . CHOLECYSTECTOMY    . DIALYSIS/PERMA CATHETER INSERTION N/A 03/24/2018   Procedure: DIALYSIS/PERMA CATHETER INSERTION;   Surgeon: Katha Cabal, MD;  Location: [facility];  Service: Cardiovascular;  Laterality: N/A;  . DIALYSIS/PERMA CATHETER REMOVAL N/A 09/06/2018   Procedure: DIALYSIS/PERMA CATHETER REMOVAL;  Surgeon: Algernon Huxley, MD;  Location: [facility];  Service: Cardiovascular;  Laterality: N/A;  . INSERTION PROSTATE RADIATION SEED    . IR PERC PLEURAL DRAIN W/INDWELL CATH W/IMG GUIDE  11/03/2019  . IRRIGATION AND DEBRIDEMENT FOOT Right 12/17/2018   Procedure: IRRIGATION AND DEBRIDEMENT FOOT;  Surgeon: Samara Deist, DPM;  Location: [facility];  Service: Podiatry;  Laterality: Right;  . LOWER EXTREMITY ANGIOGRAPHY Right 06/16/2018   Procedure: LOWER EXTREMITY ANGIOGRAPHY;  Surgeon: Algernon Huxley, MD;  Location: [facility];  Service: Cardiovascular;  Laterality: Right;  . LOWER EXTREMITY ANGIOGRAPHY Left 08/30/2018   Procedure: LOWER EXTREMITY ANGIOGRAPHY;  Surgeon: Algernon Huxley, MD;  Location: [facility];  Service: Cardiovascular;  Laterality: Left;  . LOWER EXTREMITY ANGIOGRAPHY Right 09/30/2018   Procedure: LOWER EXTREMITY ANGIOGRAPHY;  Surgeon: Algernon Huxley, MD;  Location: [facility];  Service: Cardiovascular;  Laterality: Right;  . LOWER EXTREMITY ANGIOGRAPHY Left 10/07/2018   Procedure: LOWER EXTREMITY ANGIOGRAPHY;  Surgeon: Algernon Huxley, MD;  Location: [facility];  Service: Cardiovascular;  Laterality: Left;  . LOWER EXTREMITY ANGIOGRAPHY Right 12/20/2018   Procedure: Lower Extremity Angiography;  Surgeon: Algernon Huxley, MD;  Location: [facility];  Service: Cardiovascular;  Laterality: Right;  . TRANSMETATARSAL AMPUTATION Bilateral 10/13/2018   Procedure: 1.  Amputation right great toe MTPJ 2.  Amputation right second toe MTPJ 3.  Amputation right fourth toe MTPJ 4.  Amputation right fifth toe MTPJ 5.  Excision distal second metatarsal head right foot 6.  Excision distal third metatarsal head right foot 7.  Amputation left third toe 8.   Incision and drainage infectionleft 2nd toe amputation site. ;  Surgeon: Samara Deist, DPM;  Location   SOCIAL HISTORY:   Social History   Tobacco Use  . Smoking status: Current Some Day Smoker    Packs/day: 0.10    Types: Cigarettes  . Smokeless tobacco: Never Used  . Tobacco comment: pt currently smokes about 3 cigarettes a day   Substance Use Topics  . Alcohol use: Not Currently    Alcohol/week: 0.0 standard drinks   FAMILY HISTORY:   Family History  Problem Relation Age of Onset  . Varicose Veins Neg Hx    DRUG ALLERGIES:   Allergies  Allergen Reactions  . Penicillin G Other (See Comments)    Other reaction(s): Unknown DID THE REACTION INVOLVE: Swelling of the face/tongue/throat, SOB, or low BP? Unknown Sudden or severe rash/hives, skin peeling, or the inside of the mouth or nose? Unknown Did it require medical treatment? Unknown When did it last happen?unknown If all above answers are "NO", may proceed with cephalosporin use.    REVIEW OF SYSTEMS:  ROS unable to obtain due to his mental status MEDICATIONS AT HOME:   Prior to Admission medications   Medication Sig Start Date End Date Taking? Authorizing Provider  acetaminophen (TYLENOL) 325 MG tablet Take 2 tablets (650 mg total) by mouth every 6 (six) hours as needed for mild pain, moderate pain, fever or headache. 09/04/20   Loletha Grayer, MD  aspirin 81 MG EC tablet Take 1 tablet (81 mg total) by mouth daily. 10/24/19   Ezekiel Slocumb, DO  clopidogrel (PLAVIX) 75 MG tablet Take 1 tablet (75 mg total) by mouth daily. 10/24/19   Nicole Kindred A, DO  escitalopram (LEXAPRO) 10 MG tablet Take 1 tablet (10 mg total) by mouth daily. 10/24/19   Ezekiel Slocumb, DO  gabapentin (NEURONTIN) 100 MG capsule Take 1 capsule (100 mg total) by mouth 3 (three) times daily. 09/04/20   Loletha Grayer, MD  guaiFENesin (MUCINEX) 600 MG 12 hr tablet Take 1 tablet (600 mg total) by mouth 2 (two) times daily. 06/20/20   Earlie Server,  MD  Ipratropium-Albuterol (COMBIVENT RESPIMAT) 20-100 MCG/ACT AERS respimat Inhale 1 puff into the lungs every 6 (six) hours. 09/04/20   Loletha Grayer, MD  levothyroxine (SYNTHROID) 50 MCG tablet Take 1 tablet (50 mcg total) by mouth daily before breakfast. 08/31/20   Earlie Server, MD  losartan (COZAAR) 50 MG tablet Take 1 tablet (50 mg total) by mouth daily. 09/04/20   Loletha Grayer, MD  metoprolol succinate (TOPROL-XL) 25 MG 24 hr tablet Take 1 tablet (25 mg total) by mouth at bedtime. 09/04/20   Loletha Grayer, MD  multivitamin (RENA-VIT) TABS tablet Take 1 tablet by mouth at bedtime. 09/04/20   Loletha Grayer, MD  nicotine (NICODERM CQ - DOSED IN MG/24 HOURS) 21 mg/24hr patch Place 1 patch (21 mg total) onto the skin daily. 11/06/19   Raiford Noble Latif, DO  Nutritional Supplements (FEEDING SUPPLEMENT, NEPRO CARB STEADY,) LIQD Take 237 mLs by mouth 2 (two) times daily between meals. 09/04/20  Loletha Grayer, MD  ondansetron (ZOFRAN) 4 MG tablet Take 1 tablet (4 mg total) by mouth every 6 (six) hours as needed for nausea. 11/05/19   Raiford Noble Latif, DO  oxyCODONE (OXY IR/ROXICODONE) 5 MG immediate release tablet Take 1 tablet (5 mg total) by mouth every 6 (six) hours as needed for severe pain. 09/04/20   Loletha Grayer, MD  polyethylene glycol (MIRALAX / GLYCOLAX) 17 g packet Take 17 g by mouth daily as needed for mild constipation. 09/04/20   Loletha Grayer, MD  tamsulosin (FLOMAX) 0.4 MG CAPS capsule Take 1 capsule (0.4 mg total) by mouth daily. 08/01/20   Earlie Server, MD  torsemide (DEMADEX) 100 MG tablet Take 1 tablet (100 mg total) by mouth daily. 09/04/20   Loletha Grayer, MD    VITAL SIGNS:  Blood pressure (!) 132/95, pulse 66, temperature (!) 97.5 F (36.4 C), temperature source Oral, resp. rate 13, height 6\' 1"  (1.854 m), weight 59 kg, SpO2 96 %. PHYSICAL EXAMINATION:  Physical Exam  GENERAL:  75 y.o.-year-old cachectic patient lying in the bed.  Seems altered and confused EYES: Pupils  equal, round, reactive to light and accommodation. No scleral icterus. Extraocular muscles intact.  HEENT: Head atraumatic, normocephalic. Oropharynx and nasopharynx clear.  NECK:  Supple, no jugular venous distention. No thyroid enlargement, no tenderness.  LUNGS: Normal breath sounds bilaterally, no wheezing, rales,rhonchi or crepitation. No use of accessory muscles of respiration.  CARDIOVASCULAR: S1, S2 normal. No murmurs, rubs, or gallops.  ABDOMEN: Soft, nontender, nondistended. Bowel sounds present. No organomegaly or mass.  EXTREMITIES: No pedal edema, cyanosis, or clubbing.  NEUROLOGIC: Cranial nerves II through XII are intact. Muscle strength 5/5 in all extremities. Sensation intact. Gait not checked.  PSYCHIATRIC: The patient is awake but disoriented Dialysis access: Left arm AV fistula LABORATORY PANEL:   CBC Recent Labs  Lab 09/20/20 1156  WBC 6.5  HGB 13.0  HCT 39.7  PLT 127*   ------------------------------------------------------------------------------------------------------------------  Chemistries  Recent Labs  Lab 09/20/20 1156  NA 133*  K 5.2*  CL 95*  CO2 27  GLUCOSE 103*  BUN 29*  CREATININE 5.73*  CALCIUM 11.1*  AST 27  ALT 12  ALKPHOS 29*  BILITOT 1.1   ------------------------------------------------------------------------------------------------------------------  Cardiac Enzymes No results for input(s): TROPONINI in the last 168 hours. ------------------------------------------------------------------------------------------------------------------  RADIOLOGY:  CT Head Wo Contrast  Result Date: 09/20/2020 CLINICAL DATA:  Dialysis patient with mental status changes. Lethargy. EXAM: CT HEAD WITHOUT CONTRAST TECHNIQUE: Contiguous axial images were obtained from the base of the skull through the vertex without intravenous contrast. COMPARISON:  03/08/2007 FINDINGS: Brain: Generalized atrophy. Chronic small-vessel ischemic changes of the pons.  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 Advanced chronic small vessel disease of the hemispheric white matter. No evidence of recent infarction, mass lesion, hemorrhage, hydrocephalus or extra-axial collection Vascular: There is atherosclerotic calcification of the major vessels at the base of the brain. Skull: Negative Sinuses/Orbits: Clear/normal Other: None IMPRESSION: No acute finding by CT. Advanced chronic small-vessel ischemic changes throughout the brain as outlined above. Electronically Signed   By: Nelson Chimes M.D.   On: 09/20/2020 12:44   DG Chest Portable 1 View  Result Date: 09/20/2020 CLINICAL DATA:  Altered mental status. Dialysis patient. EXAM: PORTABLE CHEST 1 VIEW COMPARISON:  09/01/2020 FINDINGS: Stable enlarged cardiac silhouette. Small to moderate-sized left pleural effusion with an interval increase. Interval patchy opacity throughout the majority of the left  lung. Stable prominence of the interstitial markings in the right lung. No pleural fluid on the right. Stable mild to moderate scoliosis. IMPRESSION: 1. Interval left lung probable multilobar pneumonia. 2. Small to moderate-sized left pleural effusion, increased. 3. Stable cardiomegaly and chronic interstitial lung disease. Electronically Signed   By: Claudie Revering M.D.   On: 09/20/2020 12:48   IMPRESSION AND PLAN:  75 year old male with a known history of ESRD on hemodialysis, diabetes, hypertension, hyperlipidemia is admitted for acute metabolic encephalopathy likely due to underlying pneumonia.  Acute metabolic encephalopathy Present on admission likely due to pneumonia and uremia due to missed dialysis CT head negative  Multi lobar pneumonia Cannot rule out aspiration and/or healthcare-acquired.  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 Secure text sent to Dr Tasia Catchings Overall poor prognosis we will consult palliative care.  He was followed by palliative with outpatient.  If he gives up dialysis -he seems appropriate for hospice  Hypothyroidism Elevated TSH.  We will cut back on Synthroid from 50 to 25 mcg at this time  Anxiety/depression Continue Lexapro  CAD Continue aspirin/Plavix and Toprol  Essential hypertension on Coreg and losartan  Neuropathy on low-dose gabapentin  Type 2 diabetes mellitus with hyperlipidemia-hold atorvastatin because of generalized weakness.  Patient's hemoglobin A1c is on the lower side at 6.3 and can watch off medications at this point.  Check fingersticks before every meal and nightly if elevated can start sliding scale insulin.   BPH on Flomax   Severe protein calorie malnutrition -overall poor prognosis, hospice appropriate   I tried calling son [name] at # listed. Left voice mail as no one picked up   All the records are reviewed and case discussed with ED provider. Management plans discussed with the patient, nursing and they are in agreement.  CODE STATUS: full code  TOTAL TIME TAKING CARE OF THIS PATIENT: 45 minutes.    Max Sane M.D on 09/20/2020 at 2:54 PM  [facility]   CC: Primary care physician; [name], MD   Note: This dictation was prepared with Dragon dictation along with smaller phrase technology. Any transcriptional errors that result from this process are unintentional.

## 2020-09-20 NOTE — ED Notes (Signed)
Informed RN bed assigned [id number]

## 2020-09-20 NOTE — Plan of Care (Signed)

## 2020-09-20 NOTE — ED Notes (Signed)
Report given to dialysis RN christina young

## 2020-09-20 NOTE — ED Triage Notes (Signed)
Pt coming from dialysis via EMS, lethargic on arrival to dialysis prior to starting treatment, dialysis staff states this has been worsening over the last couple of weeks. Chronically on 2L nasal cannula, O2 saturation with EMS 100%, other VSS with EMS per EMS. Per EMS he responds to verbal stimulation and follows commands, pt is lethargic on arrival. Negative stroke screen per EMS, blood glucose 118 with EMS.

## 2020-09-20 NOTE — ED Notes (Signed)
Pt transported to dialysis at this time by transport. IP RN made aware.

## 2020-09-20 NOTE — ED Provider Notes (Signed)
[facility] Emergency Department Provider Note  ____________________________________________   Event Date/Time   First MD Initiated Contact with Patient 09/20/20 1138     (approximate)  I have reviewed the triage vital signs and the nursing notes.   HISTORY  Chief Complaint Altered Mental Status    HPI Jose [name] is a 75 y.o. male with past medical history of diabetes, CKD, hypertension, hyperlipidemia, here with generalized weakness.  The patient arrives from dialysis.  He reportedly went to dialysis and was very altered and confused.  He has a history of similar episodes in the setting of pneumonia or infection in the past.  He states he feels somewhat better now but does feel generally weak.  Denies any focal numbness or weakness.  Denies any headaches.  Denies any chest pain.  No cough or shortness of breath.  He states he has been adherent with his dialysis.  He did not receive a session today.  Denies any difficulty speaking or swallowing.  No vision changes.  No recent medication changes.  No specific alleviating or aggravating factors.  Remainder of history limited due to mild confusion and drowsiness.        Past Medical History:  Diagnosis Date  . Chronic kidney disease   . Depression   . Diabetes mellitus without complication ([place])   . Glaucoma   . Glaucoma   . Gout   . Heart murmur   . HLD (hyperlipidemia)   . Hypertension   . Primary adenocarcinoma of left lung ([place])   . Prostate cancer ([place])   . Renal failure    dialysis t/t/s  . Renal insufficiency   . Stroke ([place])    tia x 2    Patient Active Problem List   Diagnosis Date Noted  . Pneumonia 09/20/2020  . Protein-calorie malnutrition, severe 09/03/2020  . Hypervolemia   . Generalized weakness   . Hypothyroidism   . Physical deconditioning 09/01/2020  . Weight loss 07/11/2020  . Encounter for antineoplastic immunotherapy 02/15/2020  . Goals of care,  counseling/discussion 12/23/2019  . 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 Malignant neoplasm of upper lobe of left lung ([place]) 12/23/2019  . Thickening of wall of gallbladder   . Primary adenocarcinoma of left lung ([place])   . Hypokalemia 10/30/2019  . Chronic diastolic CHF (congestive heart failure) ([place]) 10/30/2019  . Benign essential HTN 10/30/2019  . Malignant neoplasm of unknown origin ([place]) 10/30/2019  . Acute respiratory failure with hypoxia ([place]) 10/30/2019  . End-stage renal disease on hemodialysis ([place]) 10/30/2019  . Type 2 diabetes mellitus with hyperlipidemia ([place]) 10/30/2019  . SOB (shortness of breath) 10/29/2019  . Pulmonary edema 10/22/2019  . Diarrhea 10/22/2019  . Acute on chronic diastolic CHF (congestive heart failure) ([place]) 10/22/2019  . HLD (hyperlipidemia)   . Stroke ([place])   . Anxiety and depression   . Tobacco abuse   . Pleural effusion on left   . Acute urinary retention   . Foot ulcer ([place]) 12/15/2018  . Ischemic foot 10/11/2018  . Atherosclerosis of native arteries of the extremities with gangrene ([place]) 07/09/2018  . Gangrene of toe of right foot ([place]) 06/13/2018  . Type II diabetes mellitus with renal manifestations ([place]) 04/16/2018  . Hypertension 04/16/2018  . ESRD on dialysis ([place]) 04/16/2018  . Malnutrition of moderate degree 03/27/2018  . Renal failure 03/21/2018    Past Surgical History:  Procedure Laterality Date  . AMPUTATION TOE Bilateral 07/23/2018   Procedure: TOE MPJ RIGHT  3RD AND LEFT 2ND;  Surgeon: Samara Deist, DPM;  Location: [facility] ORS;  Service: Podiatry;  Laterality: Bilateral;  . APPENDECTOMY    . AV FISTULA PLACEMENT Left 06/09/2018   Procedure: ARTERIOVENOUS (AV) FISTULA CREATION ( BRACHIAL CEPHALIC);  Surgeon: Algernon Huxley, MD;  Location: [facility] ORS;  Service: Vascular;  Laterality: Left;  . CHOLECYSTECTOMY    . DIALYSIS/PERMA CATHETER INSERTION N/A 03/24/2018   Procedure: DIALYSIS/PERMA CATHETER INSERTION;  Surgeon: Katha Cabal, MD;  Location: [place] [facility];  Service: Cardiovascular;  Laterality: N/A;  . DIALYSIS/PERMA CATHETER REMOVAL N/A 09/06/2018   Procedure: DIALYSIS/PERMA CATHETER REMOVAL;  Surgeon: Algernon Huxley, MD;  Location: [place] [facility];  Service: Cardiovascular;  Laterality: N/A;  . INSERTION PROSTATE RADIATION SEED    . IR PERC PLEURAL DRAIN W/INDWELL CATH W/IMG GUIDE  11/03/2019  . IRRIGATION AND DEBRIDEMENT FOOT Right 12/17/2018   Procedure: IRRIGATION AND DEBRIDEMENT FOOT;  Surgeon: Samara Deist, DPM;  Location: [facility] OR[facility];  Service: Podiatry;  Laterality: Right;  . LOWER EXTREMITY ANGIOGRAPHY Right 06/16/2018   Procedure: LOWER EXTREMITY ANGIOGRAPHY;  Surgeon: Algernon Huxley, MD;  Location: [place] [facility];  Service: Cardiovascular;  Laterality: Right;  . LOWER EXTREMITY ANGIOGRAPHY Left 08/30/2018   Procedure: LOWER EXTREMITY ANGIOGRAPHY;  Surgeon: Algernon Huxley, MD;  Location: [place] [facility];  Service: Cardiovascular;  Laterality: Left;  . LOWER EXTREMITY ANGIOGRAPHY Right 09/30/2018   Procedure: LOWER EXTREMITY ANGIOGRAPHY;  Surgeon: Algernon Huxley, MD;  Location: [place] [facility];  Service: Cardiovascular;  Laterality: Right;  . LOWER EXTREMITY ANGIOGRAPHY Left 10/07/2018   Procedure: LOWER EXTREMITY ANGIOGRAPHY;  Surgeon: Algernon Huxley, MD;  Location: [place] [facility];  Service: Cardiovascular;  Laterality: Left;  . LOWER EXTREMITY ANGIOGRAPHY Right 12/20/2018   Procedure: Lower Extremity Angiography;  Surgeon: Algernon Huxley, MD;  Location: [place] [facility];  Service: Cardiovascular;  Laterality: Right;  . TRANSMETATARSAL AMPUTATION Bilateral 10/13/2018   Procedure: 1.  Amputation right great toe MTPJ 2.  Amputation right second toe MTPJ 3.  Amputation right fourth toe MTPJ 4.  Amputation right fifth toe MTPJ 5.  Excision distal second metatarsal head right foot 6.  Excision distal third metatarsal head right foot 7.  Amputation left third toe 8.  Incision and drainage  infectionleft 2nd toe amputation site. ;  Surgeon: Samara Deist, DPM;  Location    Prior to Admission medications   Medication Sig Start Date End Date Taking? Authorizing Provider  acetaminophen (TYLENOL) 325 MG tablet Take 2 tablets (650 mg total) by mouth every 6 (six) hours as needed for mild pain, moderate pain, fever or headache. 09/04/20   Loletha Grayer, MD  aspirin 81 MG EC tablet Take 1 tablet (81 mg total) by mouth daily. 10/24/19   Ezekiel Slocumb, DO  clopidogrel (PLAVIX) 75 MG tablet Take 1 tablet (75 mg total) by mouth daily. 10/24/19   Nicole Kindred A, DO  escitalopram (LEXAPRO) 10 MG tablet Take 1 tablet (10 mg total) by mouth daily. 10/24/19   Ezekiel Slocumb, DO  gabapentin (NEURONTIN) 100 MG capsule Take 1 capsule (100 mg total) by mouth 3 (three) times daily. 09/04/20   Loletha Grayer, MD  guaiFENesin (MUCINEX) 600 MG 12 hr tablet Take 1 tablet (600 mg total) by mouth 2 (two) times daily. 06/20/20   Earlie Server, MD  Ipratropium-Albuterol (COMBIVENT RESPIMAT) 20-100 MCG/ACT AERS respimat Inhale 1 puff into the lungs every 6 (six) hours. 09/04/20   [place],  Richard, MD  levothyroxine (SYNTHROID) 50 MCG tablet Take 1 tablet (50 mcg total) by mouth daily before breakfast. 08/31/20   Earlie Server, MD  losartan (COZAAR) 50 MG tablet Take 1 tablet (50 mg total) by mouth daily. 09/04/20   Loletha Grayer, MD  metoprolol succinate (TOPROL-XL) 25 MG 24 hr tablet Take 1 tablet (25 mg total) by mouth at bedtime. 09/04/20   Loletha Grayer, MD  multivitamin (RENA-VIT) TABS tablet Take 1 tablet by mouth at bedtime. 09/04/20   Loletha Grayer, MD  nicotine (NICODERM CQ - DOSED IN MG/24 HOURS) 21 mg/24hr patch Place 1 patch (21 mg total) onto the skin daily. 11/06/19   Raiford Noble Latif, DO  Nutritional Supplements (FEEDING SUPPLEMENT, NEPRO CARB STEADY,) LIQD Take 237 mLs by mouth 2 (two) times daily between meals. 09/04/20   Loletha Grayer, MD  ondansetron (ZOFRAN) 4 MG tablet Take 1 tablet (4 mg  total) by mouth every 6 (six) hours as needed for nausea. 11/05/19   Raiford Noble Latif, DO  oxyCODONE (OXY IR/ROXICODONE) 5 MG immediate release tablet Take 1 tablet (5 mg total) by mouth every 6 (six) hours as needed for severe pain. 09/04/20   Loletha Grayer, MD  polyethylene glycol (MIRALAX / GLYCOLAX) 17 g packet Take 17 g by mouth daily as needed for mild constipation. 09/04/20   Loletha Grayer, MD  tamsulosin (FLOMAX) 0.4 MG CAPS capsule Take 1 capsule (0.4 mg total) by mouth daily. 08/01/20   Earlie Server, MD  torsemide (DEMADEX) 100 MG tablet Take 1 tablet (100 mg total) by mouth daily. 09/04/20   Loletha Grayer, MD    Allergies Penicillin g  Family History  Problem Relation Age of Onset  . Varicose Veins Neg Hx     Social History Social History   Tobacco Use  . Smoking status: Current Some Day Smoker    Packs/day: 0.10    Types: Cigarettes  . Smokeless tobacco: Never Used  . Tobacco comment: pt currently smokes about 3 cigarettes a day   Vaping Use  . Vaping Use: Never used  Substance Use Topics  . Alcohol use: Not Currently    Alcohol/week: 0.0 standard drinks  . Drug use: Never    Review of Systems  Review of Systems  Constitutional: Positive for fatigue. Negative for chills and fever.  HENT: Negative for sore throat.   Respiratory: Negative for shortness of breath.   Cardiovascular: Negative for chest pain.  Gastrointestinal: Negative for abdominal pain.  Genitourinary: Negative for flank pain.  Musculoskeletal: Negative for neck pain.  Skin: Negative for rash and wound.  Allergic/Immunologic: Negative for immunocompromised state.  Neurological: Positive for weakness. Negative for numbness.  Hematological: Does not bruise/bleed easily.  Psychiatric/Behavioral: Positive for confusion.  All other systems reviewed and are negative.    ____________________________________________  PHYSICAL EXAM:      VITAL SIGNS: ED Triage Vitals  Enc Vitals Group     BP  09/20/20 1139 129/83     Pulse Rate 09/20/20 1139 77     Resp 09/20/20 1139 14     Temp 09/20/20 1139 (!) 97.5 F (36.4 C)     Temp Source 09/20/20 1139 Oral     SpO2 09/20/20 1139 100 %     Weight 09/20/20 1144 130 lb 1.1 oz (59 kg)     Height 09/20/20 1144 6\' 1"  (1.854 m)     Head Circumference --      Peak Flow --      Pain Score 09/20/20 1144  0     Pain Loc --      Pain Edu? --      Excl. in [place]? --      Physical Exam Vitals and nursing note reviewed.  Constitutional:      General: He is not in acute distress.    Appearance: He is well-developed.  HENT:     Head: Normocephalic and atraumatic.     Mouth/Throat:     Mouth: Mucous membranes are moist.  Eyes:     Conjunctiva/sclera: Conjunctivae normal.  Cardiovascular:     Rate and Rhythm: Normal rate and regular rhythm.     Heart sounds: Normal heart sounds.  Pulmonary:     Effort: Pulmonary effort is normal. No respiratory distress.     Breath sounds: No wheezing.  Abdominal:     General: There is no distension.  Musculoskeletal:     Cervical back: Neck supple.  Skin:    General: Skin is warm.     Capillary Refill: Capillary refill takes less than 2 seconds.     Findings: No rash.  Neurological:     Mental Status: He is alert. He is disoriented.     Motor: No abnormal muscle tone.       ____________________________________________   LABS (all labs ordered are listed, but only abnormal results are displayed)  Labs Reviewed  CBC WITH DIFFERENTIAL/PLATELET - Abnormal; Notable for the following components:      Result Value   RBC 4.03 (*)    Platelets 127 (*)    All other components within normal limits  COMPREHENSIVE METABOLIC PANEL - Abnormal; Notable for the following components:   Sodium 133 (*)    Potassium 5.2 (*)    Chloride 95 (*)    Glucose, Bld 103 (*)    BUN 29 (*)    Creatinine, Ser 5.73 (*)    Calcium 11.1 (*)    Albumin 3.0 (*)    Alkaline Phosphatase 29 (*)    GFR, Estimated 10 (*)     All other components within normal limits  TSH - Abnormal; Notable for the following components:   TSH 12.172 (*)    All other components within normal limits  TROPONIN I (HIGH SENSITIVITY) - Abnormal; Notable for the following components:   Troponin I (High Sensitivity) 29 (*)    All other components within normal limits  TROPONIN I (HIGH SENSITIVITY) - Abnormal; Notable for the following components:   Troponin I (High Sensitivity) 28 (*)    All other components within normal limits  RESP PANEL BY RT-PCR (FLU A&B, COVID) ARPGX2  CULTURE, BLOOD (ROUTINE X 2)  CULTURE, BLOOD (ROUTINE X 2)  RESPIRATORY PANEL BY PCR  EXPECTORATED SPUTUM ASSESSMENT W GRAM STAIN, RFLX TO RESP C  ETHANOL  LACTIC ACID, PLASMA  LACTIC ACID, PLASMA  HIV ANTIBODY (ROUTINE TESTING W REFLEX)  CBC  LEGIONELLA PNEUMOPHILA SEROGP 1 UR AG  STREP PNEUMONIAE URINARY ANTIGEN  COMPREHENSIVE METABOLIC PANEL  CBC    ____________________________________________  EKG: Atrial fibrillation with variable AV block.  Ventricular rate 63, QRS 174, QTc 466.  No acute ST elevations or depressions. ________________________________________  RADIOLOGY All imaging, including plain films, CT scans, and ultrasounds, independently reviewed by me, and interpretations confirmed via formal radiology reads.  ED MD interpretation:   CT Head: No acute abnormality  Official radiology report(s): CT Head Wo Contrast  Result Date: 09/20/2020 CLINICAL DATA:  Dialysis patient with mental status changes. Lethargy. EXAM: CT HEAD WITHOUT CONTRAST TECHNIQUE: Contiguous  axial images were obtained from the base of the skull through the vertex without intravenous contrast. COMPARISON:  03/08/2007 FINDINGS: Brain: Generalized atrophy. Chronic small-vessel ischemic changes of the pons. 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 Advanced chronic small vessel disease of  the hemispheric white matter. No evidence of recent infarction, mass lesion, hemorrhage, hydrocephalus or extra-axial collection Vascular: There is atherosclerotic calcification of the major vessels at the base of the brain. Skull: Negative Sinuses/Orbits: Clear/normal Other: None IMPRESSION: No acute finding by CT. Advanced chronic small-vessel ischemic changes throughout the brain as outlined above. Electronically Signed   By: Nelson Chimes M.D.   On: 09/20/2020 12:44   DG Chest Portable 1 View  Result Date: 09/20/2020 CLINICAL DATA:  Altered mental status. Dialysis patient. EXAM: PORTABLE CHEST 1 VIEW COMPARISON:  09/01/2020 FINDINGS: Stable enlarged cardiac silhouette. Small to moderate-sized left pleural effusion with an interval increase. Interval patchy opacity throughout the majority of the left lung. Stable prominence of the interstitial markings in the right lung. No pleural fluid on the right. Stable mild to moderate scoliosis. IMPRESSION: 1. Interval left lung probable multilobar pneumonia. 2. Small to moderate-sized left pleural effusion, increased. 3. Stable cardiomegaly and chronic interstitial lung disease. Electronically Signed   By: Claudie Revering M.D.   On: 09/20/2020 12:48    ____________________________________________  PROCEDURES   Procedure(s) performed (including Critical Care):  .1-3 Lead EKG Interpretation Performed by: Duffy Bruce, MD Authorized by: Duffy Bruce, MD     Interpretation: normal     ECG rate:  80-90   ECG rate assessment: normal     Rhythm: sinus rhythm     Ectopy: none     Conduction: normal   Comments:     Indication: SOB, weakness     ____________________________________________  INITIAL IMPRESSION / MDM / [place] / ED COURSE  As part of my medical decision making, I reviewed the following data within the [place] notes reviewed and incorporated, Old chart reviewed, Notes from prior ED visits, and  [place] Controlled Substance Database       *Jose [name] was evaluated in Emergency Department on 09/20/2020 for the symptoms described in the history of present illness. He was evaluated in the context of the global COVID-19 pandemic, which necessitated consideration that the patient might be at risk for infection with the SARS-CoV-2 virus that causes COVID-19. Institutional protocols and algorithms that pertain to the evaluation of patients at risk for COVID-19 are in a state of rapid change based on information released by regulatory bodies including the CDC and federal and state organizations. These policies and algorithms were followed during the patient's care in the ED.  Some ED evaluations and interventions may be delayed as a result of limited staffing during the pandemic.*     Medical Decision Making: 75 year old male here with generalized weakness and confusion.  No focal deficits noted.  Patient is afebrile and hemodynamically stable.  Lab work reviewed, shows normal white count.  Renal function is as expected for not going to dialysis today.  He has normal work of breathing.  Troponin at baseline.  EKG nonischemic.  Lactic acid normal.  Chest x-ray reviewed, shows interval multilobar pneumonia.  Patient was started on IV antibiotics.  Nephrology consulted and will take the patient for dialysis.  Admit to medicine.  COVID is pending.  ____________________________________________  FINAL CLINICAL IMPRESSION(S) / ED DIAGNOSES  Final  diagnoses:  HCAP (healthcare-associated pneumonia)     MEDICATIONS GIVEN DURING THIS VISIT:  Medications  vancomycin (VANCOREADY) IVPB 1250 mg/250 mL (has no administration in time range)  escitalopram (LEXAPRO) tablet 10 mg (has no administration in time range)  aspirin EC tablet 81 mg (has no administration in time range)  clopidogrel (PLAVIX) tablet 75 mg (has no administration in time range)  ondansetron (ZOFRAN) tablet 4 mg (has no administration in  time range)  nicotine (NICODERM CQ - dosed in mg/24 hours) patch 21 mg (has no administration in time range)  guaiFENesin (MUCINEX) 12 hr tablet 600 mg (600 mg Oral Not Given 09/20/20 1714)  tamsulosin (FLOMAX) capsule 0.4 mg (has no administration in time range)  gabapentin (NEURONTIN) capsule 100 mg (100 mg Oral Not Given 09/20/20 1714)  oxyCODONE (Oxy IR/ROXICODONE) immediate release tablet 5 mg (has no administration in time range)  acetaminophen (TYLENOL) tablet 650 mg (has no administration in time range)  feeding supplement (NEPRO CARB STEADY) liquid 237 mL (has no administration in time range)  Ipratropium-Albuterol (COMBIVENT) respimat 1 puff (has no administration in time range)  losartan (COZAAR) tablet 50 mg (has no administration in time range)  metoprolol succinate (TOPROL-XL) 24 hr tablet 25 mg (has no administration in time range)  multivitamin (RENA-VIT) tablet 1 tablet (has no administration in time range)  polyethylene glycol (MIRALAX / GLYCOLAX) packet 17 g (has no administration in time range)  torsemide (DEMADEX) tablet 100 mg (has no administration in time range)  heparin injection 5,000 Units (has no administration in time range)  sodium chloride flush (NS) 0.9 % injection 3 mL (has no administration in time range)  sodium chloride flush (NS) 0.9 % injection 3 mL (has no administration in time range)  0.9 %  sodium chloride infusion (has no administration in time range)  Chlorhexidine Gluconate Cloth 2 % PADS 6 each (has no administration in time range)  levothyroxine (SYNTHROID) tablet 25 mcg (has no administration in time range)  cefTRIAXone (ROCEPHIN) 2 g in sodium chloride 0.9 % 100 mL IVPB (has no administration in time range)  azithromycin (ZITHROMAX) 500 mg in sodium chloride 0.9 % 250 mL IVPB (has no administration in time range)  ceFEPIme (MAXIPIME) 1 g in sodium chloride 0.9 % 100 mL IVPB (1 g Intravenous New Bag/Given 09/20/20 1405)     ED Discharge Orders     None       Note:  This document was prepared using Dragon voice recognition software and may include unintentional dictation errors.   Duffy Bruce, MD 09/20/20 [phone]

## 2020-09-20 NOTE — Progress Notes (Signed)
[place], [place] 09/20/20  Subjective:   LOS: 0 Jose [name] is a 75 y.o. male with past medical history of .diabetes, chronic kidney disease, hypertension. and Hyperlipidemia. He was sent to the ED from his outpatient dialysis center with altered mental status and lethargy.   Patient is seen resting in bed. Arouseble by drowsy. Denies nausea and diarrhea. Denies sick contacts and missed dialysis treatments. Denies current shortness of breath.          Objective:  Vital signs in last 24 hours:  Temp:  [97.5 F (36.4 C)] 97.5 F (36.4 C) (04/21 1139) Pulse Rate:  [71-80] 72 (04/21 1345) Resp:  [10-18] 13 (04/21 1345) BP: (129-148)/(72-85) 142/72 (04/21 1330) SpO2:  [98 %-100 %] 99 % (04/21 1345) Weight:  [59 kg] 59 kg (04/21 1144)  Weight change:  Filed Weights   09/20/20 1144  Weight: 59 kg    Intake/Output:   No intake or output data in the 24 hours ending 09/20/20 1358  Physical Exam: General:  No acute distress, laying in the bed  HEENT  anicteric, moist oral mucous membrane  Pulm/lungs  normal breathing effort, diminished in bases, room air  CVS/Heart  regular rhythm, no rub or gallop  Abdomen:   Soft, nontender  Extremities:  + peripheral edema  Neurologic:  Drowsy  Skin:  No acute rashes  Left arm aneursmal AVF     Basic Metabolic Panel:  Recent Labs  Lab 09/20/20 1156  NA 133*  K 5.2*  CL 95*  CO2 27  GLUCOSE 103*  BUN 29*  CREATININE 5.73*  CALCIUM 11.1*     CBC: Recent Labs  Lab 09/20/20 1156  WBC 6.5  NEUTROABS 4.6  HGB 13.0  HCT 39.7  MCV 98.5  PLT 127*      Lab Results  Component Value Date   HEPBSAG NON REACTIVE 09/01/2020   HEPBSAB Reactive 03/22/2018   HEPBIGM Negative 03/22/2018      Microbiology:  No results found for this or any previous visit (from the past 240 hour(s)).  Coagulation Studies: No results for input(s): LABPROT, INR in the last 72 hours.  Urinalysis: No  results for input(s): COLORURINE, LABSPEC, PHURINE, GLUCOSEU, HGBUR, BILIRUBINUR, KETONESUR, PROTEINUR, UROBILINOGEN, NITRITE, LEUKOCYTESUR in the last 72 hours.  Invalid input(s): APPERANCEUR    Imaging: CT Head Wo Contrast  Result Date: 09/20/2020 CLINICAL DATA:  Dialysis patient with mental status changes. Lethargy. EXAM: CT HEAD WITHOUT CONTRAST TECHNIQUE: Contiguous axial images were obtained from the base of the skull through the vertex without intravenous contrast. COMPARISON:  03/08/2007 FINDINGS: Brain: Generalized atrophy. Chronic small-vessel ischemic changes of the pons. 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 Advanced chronic small vessel disease of the hemispheric white matter. No evidence of recent infarction, mass lesion, hemorrhage, hydrocephalus or extra-axial collection Vascular: There is atherosclerotic calcification of the major vessels at the base of the brain. Skull: Negative Sinuses/Orbits: Clear/normal Other: None IMPRESSION: No acute finding by CT. Advanced chronic small-vessel ischemic changes throughout the brain as outlined above. Electronically Signed   By: Nelson Chimes M.D.   On: 09/20/2020 12:44   DG Chest Portable 1 View  Result Date: 09/20/2020 CLINICAL DATA:  Altered mental status. Dialysis patient. EXAM: PORTABLE CHEST 1 VIEW COMPARISON:  09/01/2020 FINDINGS: Stable enlarged cardiac silhouette. Small to moderate-sized left pleural effusion with an interval increase. Interval patchy opacity throughout the majority of the left lung.  Stable prominence of the interstitial markings in the right lung. No pleural fluid on the right. Stable mild to moderate scoliosis. IMPRESSION: 1. Interval left lung probable multilobar pneumonia. 2. Small to moderate-sized left pleural effusion, increased. 3. Stable cardiomegaly and chronic interstitial lung disease. Electronically Signed   By: Claudie Revering  M.D.   On: 09/20/2020 12:48     Medications:    Scheduled Meds: Continuous Infusions: PRN Meds:.    Assessment/ Plan:  75 y.o. male with past medical history of .diabetes, chronic kidney disease, hypertension. and Hyperlipidemia.was admitted on 09/20/2020 for   Active Problems:   Pneumonia  Pneumonia [J18.9] x 2 weeks   [facility] Tu[facility]-2  wt 66.5 kg  #. ESRD -Sent from outpatient dialysis center  - Scheduled for dialysis today - UF goal 500 ml - Next treatment scheduled for Saturday  #. Anemia of CKD  Lab Results  Component Value Date   HGB 13.0 09/20/2020   Avoid Epogen as patient has known malignancy Receives chemotherapy and is followed by Dr. Tasia Catchings at the cancer center  #. Secondary hyperparathyroidism of renal origin N 25.81      Component Value Date/Time   PTH 226 (H) 03/25/2018 1557   Lab Results  Component Value Date   PHOS 3.3 09/01/2020  Continue Calcium acetate with meals Phosphorus at goal  # Hypertension - Elevated - Continue home regimen of Losartan and Metoprolol   LOS: 0 Jose [name] 4/21/20221:58 PM  [place], [place]

## 2020-09-20 NOTE — ED Notes (Signed)
ED Tech at bedside to obtain blood cultures

## 2020-09-20 NOTE — Consult Note (Signed)
PHARMACY -  BRIEF ANTIBIOTIC NOTE   Pharmacy has received consult(s) for vancomycin and cefepime from an ED provider. The patient's profile has been reviewed for ht/wt/allergies/indication/available labs.    Patient has a penicillin allergy listed with unknown reaction and has tolerated cephalosporins in the past.   One time order(s) placed for cefepime 1 g and vancomycin 1250 mg IV   Further antibiotics/pharmacy consults should be ordered by admitting physician if indicated.                       Thank you, Darnelle Bos, PharmD 09/20/2020  1:15 PM

## 2020-09-21 DIAGNOSIS — Z66 Do not resuscitate: Secondary | ICD-10-CM

## 2020-09-21 DIAGNOSIS — C3490 Malignant neoplasm of unspecified part of unspecified bronchus or lung: Secondary | ICD-10-CM

## 2020-09-21 DIAGNOSIS — R4182 Altered mental status, unspecified: Secondary | ICD-10-CM

## 2020-09-21 DIAGNOSIS — J189 Pneumonia, unspecified organism: Secondary | ICD-10-CM | POA: Diagnosis not present

## 2020-09-21 DIAGNOSIS — Z992 Dependence on renal dialysis: Secondary | ICD-10-CM

## 2020-09-21 DIAGNOSIS — N186 End stage renal disease: Secondary | ICD-10-CM | POA: Diagnosis not present

## 2020-09-21 DIAGNOSIS — Z7189 Other specified counseling: Secondary | ICD-10-CM | POA: Diagnosis not present

## 2020-09-21 DIAGNOSIS — Z515 Encounter for palliative care: Secondary | ICD-10-CM

## 2020-09-21 LAB — CBC
HCT: 39.6 % (ref 39.0–52.0)
Hemoglobin: 13.4 g/dL (ref 13.0–17.0)
MCH: 32.6 pg (ref 26.0–34.0)
MCHC: 33.8 g/dL (ref 30.0–36.0)
MCV: 96.4 fL (ref 80.0–100.0)
Platelets: 104 10*3/uL — ABNORMAL LOW (ref 150–400)
RBC: 4.11 MIL/uL — ABNORMAL LOW (ref 4.22–5.81)
RDW: 15 % (ref 11.5–15.5)
WBC: 7.1 10*3/uL (ref 4.0–10.5)
nRBC: 0 % (ref 0.0–0.2)

## 2020-09-21 LAB — COMPREHENSIVE METABOLIC PANEL
ALT: 9 U/L (ref 0–44)
AST: 25 U/L (ref 15–41)
Albumin: 2.5 g/dL — ABNORMAL LOW (ref 3.5–5.0)
Alkaline Phosphatase: 27 U/L — ABNORMAL LOW (ref 38–126)
Anion gap: 14 (ref 5–15)
BUN: 23 mg/dL (ref 8–23)
CO2: 24 mmol/L (ref 22–32)
Calcium: 10.2 mg/dL (ref 8.9–10.3)
Chloride: 98 mmol/L (ref 98–111)
Creatinine, Ser: 5.02 mg/dL — ABNORMAL HIGH (ref 0.61–1.24)
GFR, Estimated: 11 mL/min — ABNORMAL LOW (ref >60)
Glucose, Bld: 67 mg/dL — ABNORMAL LOW (ref 70–99)
Potassium: 4.5 mmol/L (ref 3.5–5.1)
Sodium: 136 mmol/L (ref 135–145)
Total Bilirubin: 0.7 mg/dL (ref 0.3–1.2)
Total Protein: 5.8 g/dL — ABNORMAL LOW (ref 6.5–8.1)

## 2020-09-21 LAB — GLUCOSE, CAPILLARY
Glucose-Capillary: 85 mg/dL (ref 70–99)
Glucose-Capillary: 85 mg/dL (ref 70–99)
Glucose-Capillary: 72 mg/dL (ref 70–99)

## 2020-09-21 LAB — HEMOGLOBIN A1C
Hgb A1c MFr Bld: 6.2 % — ABNORMAL HIGH (ref 4.8–5.6)
Mean Plasma Glucose: 131.24 mg/dL

## 2020-09-21 MED ORDER — ORAL CARE MOUTH RINSE
15.0000 mL | Freq: Two times a day (BID) | OROMUCOSAL | Status: DC
  Administered 2020-09-21 – 2020-09-26 (×12): 15 mL via OROMUCOSAL

## 2020-09-21 MED ORDER — GLYCOPYRROLATE 1 MG PO TABS
1.0000 mg | ORAL_TABLET | ORAL | Status: DC | PRN
  Filled 2020-09-21: qty 1

## 2020-09-21 MED ORDER — DIPHENHYDRAMINE HCL 50 MG/ML IJ SOLN: 25.0000 mg | INTRAMUSCULAR | Status: DC | PRN

## 2020-09-21 MED ORDER — LEVOTHYROXINE SODIUM 50 MCG PO TABS: 75.0000 ug | ORAL_TABLET | Freq: Every day | ORAL | Status: DC

## 2020-09-21 MED ORDER — LEVOTHYROXINE SODIUM 50 MCG PO TABS
50.0000 ug | ORAL_TABLET | Freq: Once | ORAL | Status: AC
  Administered 2020-09-21: 50 ug via ORAL
  Filled 2020-09-21: qty 1

## 2020-09-21 MED ORDER — MORPHINE SULFATE (PF) 2 MG/ML IV SOLN
2.0000 mg | INTRAVENOUS | Status: DC | PRN
  Administered 2020-09-21: 13:00:00 2 mg via INTRAVENOUS
  Filled 2020-09-21: qty 1

## 2020-09-21 MED ORDER — POLYVINYL ALCOHOL 1.4 % OP SOLN
1.0000 [drp] | Freq: Four times a day (QID) | OPHTHALMIC | Status: DC | PRN
  Filled 2020-09-21: qty 15

## 2020-09-21 MED ORDER — LORAZEPAM 2 MG/ML IJ SOLN: 2.0000 mg | INTRAMUSCULAR | Status: DC | PRN

## 2020-09-21 MED ORDER — INSULIN ASPART 100 UNIT/ML ~~LOC~~ SOLN: 0.0000 [IU] | Freq: Every day | SUBCUTANEOUS | Status: DC

## 2020-09-21 MED ORDER — GLYCOPYRROLATE 0.2 MG/ML IJ SOLN: 0.2000 mg | INTRAMUSCULAR | Status: DC | PRN

## 2020-09-21 MED ORDER — INSULIN ASPART 100 UNIT/ML ~~LOC~~ SOLN: 0.0000 [IU] | Freq: Three times a day (TID) | SUBCUTANEOUS | Status: DC

## 2020-09-21 MED ORDER — ACETAMINOPHEN 325 MG PO TABS: 650.0000 mg | ORAL_TABLET | Freq: Four times a day (QID) | ORAL | Status: DC | PRN

## 2020-09-21 MED ORDER — ACETAMINOPHEN 650 MG RE SUPP: 650.0000 mg | Freq: Four times a day (QID) | RECTAL | Status: DC | PRN

## 2020-09-21 NOTE — Consult Note (Signed)
Hematology/Oncology Consult note [facility] [phone] [phone]  Patient Care Team: [name], MD as PCP - General (Family Medicine) Telford Nab, RN as Oncology Nurse Navigator Earlie Server, MD as Consulting Physician (Hematology and Oncology)   Name of the patient: Jose [name]  [id number]  Oct 13, 1945   Date of visit: 09/21/20 REASON FOR COSULTATION:  Stage IV Lung cancer History of presenting illness-  75 y.o. male with PMH listed at below who was sent to emergency room department from dialysis center due to worsening of weakness.  He was reported to be very confused and altered mental status.  In emergency room, patient was found to have multilobar pneumonia on chest x-ray.  Hyperkalemia  Patient was admitted for pneumonia treatments.  Heme-onc was consulted for further evaluation. Patient was known to oncology service due to stage IV lung cancer progressed on first-line treatment with Keytruda. He was last seen by me on 09/04/2020 and the decision was made to see how he does in the next 1 to 2 weeks.  If he is able to recover to his baseline, there was plan for chemotherapy with single agent Taxol.  He also understands that due to his multiple comorbidities, performance status, there is a great chance that he may not be able to recover to the condition to be accepted for further treatments.  At that time comfort care and hospice is reasonable.  Patient was seen at the bedside.  He is lethargic, briefly opens his eyes with verbal stimuli.  Review of Systems  Unable to perform ROS: Mental status change    Allergies  Allergen Reactions  . Penicillin G Other (See Comments)    Other reaction(s): Unknown DID THE REACTION INVOLVE: Swelling of the face/tongue/throat, SOB, or low BP? Unknown Sudden or severe rash/hives, skin peeling, or the inside of the mouth or nose? Unknown Did it require medical treatment? Unknown When did it last  happen?unknown If all above answers are "NO", may proceed with cephalosporin use.     Patient Active Problem List   Diagnosis Date Noted  . Pneumonia 09/20/2020  . Protein-calorie malnutrition, severe 09/03/2020  . Hypervolemia   . Generalized weakness   . Hypothyroidism   . Physical deconditioning 09/01/2020  . Weight loss 07/11/2020  . Encounter for antineoplastic immunotherapy 02/15/2020  . Goals of care, counseling/discussion 12/23/2019  . 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 Malignant neoplasm of upper lobe of left lung ([place]) 12/23/2019  . Thickening of wall of gallbladder   . Primary adenocarcinoma of left lung ([place])   . Hypokalemia 10/30/2019  . Chronic diastolic CHF (congestive heart failure) ([place]) 10/30/2019  . Benign essential HTN 10/30/2019  . Malignant neoplasm of unknown origin ([place]) 10/30/2019  . Acute respiratory failure with hypoxia ([place]) 10/30/2019  . End-stage renal disease on hemodialysis ([place]) 10/30/2019  . Type 2 diabetes mellitus with hyperlipidemia ([place]) 10/30/2019  . SOB (shortness of breath) 10/29/2019  . Pulmonary edema 10/22/2019  . Diarrhea 10/22/2019  . Acute on chronic diastolic CHF (congestive heart failure) ([place]) 10/22/2019  . HLD (hyperlipidemia)   . Stroke ([place])   . Anxiety and depression   . Tobacco abuse   . Pleural effusion on left   . Acute urinary retention   . Foot ulcer ([place]) 12/15/2018  . Ischemic foot 10/11/2018  . Atherosclerosis of native arteries of the extremities with gangrene ([place]) 07/09/2018  . Gangrene of toe of right foot ([place]) 06/13/2018  . Type II diabetes mellitus with renal  manifestations ([place]) 04/16/2018  . Hypertension 04/16/2018  . ESRD on dialysis ([place]) 04/16/2018  . Malnutrition of moderate degree 03/27/2018  . Renal failure 03/21/2018     Past Medical History:  Diagnosis Date  . Chronic kidney disease   . Depression   . Diabetes mellitus without complication ([place])   . Glaucoma   . Glaucoma   .  Gout   . Heart murmur   . HLD (hyperlipidemia)   . Hypertension   . Primary adenocarcinoma of left lung ([place])   . Prostate cancer ([place])   . Renal failure    dialysis t/t/s  . Renal insufficiency   . Stroke ([place])    tia x 2     Past Surgical History:  Procedure Laterality Date  . AMPUTATION TOE Bilateral 07/23/2018   Procedure: TOE MPJ RIGHT 3RD AND LEFT 2ND;  Surgeon: Samara Deist, DPM;  Location: [facility];  Service: Podiatry;  Laterality: Bilateral;  . APPENDECTOMY    . AV FISTULA PLACEMENT Left 06/09/2018   Procedure: ARTERIOVENOUS (AV) FISTULA CREATION ( BRACHIAL CEPHALIC);  Surgeon: Algernon Huxley, MD;  Location: [facility];  Service: Vascular;  Laterality: Left;  . CHOLECYSTECTOMY    . DIALYSIS/PERMA CATHETER INSERTION N/A 03/24/2018   Procedure: DIALYSIS/PERMA CATHETER INSERTION;  Surgeon: Katha Cabal, MD;  Location: [place] [facility];  Service: Cardiovascular;  Laterality: N/A;  . DIALYSIS/PERMA CATHETER REMOVAL N/A 09/06/2018   Procedure: DIALYSIS/PERMA CATHETER REMOVAL;  Surgeon: Algernon Huxley, MD;  Location: [place] [facility];  Service: Cardiovascular;  Laterality: N/A;  . INSERTION PROSTATE RADIATION SEED    . IR PERC PLEURAL DRAIN W/INDWELL CATH W/IMG GUIDE  11/03/2019  . IRRIGATION AND DEBRIDEMENT FOOT Right 12/17/2018   Procedure: IRRIGATION AND DEBRIDEMENT FOOT;  Surgeon: Samara Deist, DPM;  Location: [facility];  Service: Podiatry;  Laterality: Right;  . LOWER EXTREMITY ANGIOGRAPHY Right 06/16/2018   Procedure: LOWER EXTREMITY ANGIOGRAPHY;  Surgeon: Algernon Huxley, MD;  Location: [place] [facility];  Service: Cardiovascular;  Laterality: Right;  . LOWER EXTREMITY ANGIOGRAPHY Left 08/30/2018   Procedure: LOWER EXTREMITY ANGIOGRAPHY;  Surgeon: Algernon Huxley, MD;  Location: [place] [facility];  Service: Cardiovascular;  Laterality: Left;  . LOWER EXTREMITY ANGIOGRAPHY Right 09/30/2018   Procedure: LOWER EXTREMITY ANGIOGRAPHY;  Surgeon: Algernon Huxley, MD;  Location: [place] [facility];  Service: Cardiovascular;  Laterality: Right;  . LOWER EXTREMITY ANGIOGRAPHY Left 10/07/2018   Procedure: LOWER EXTREMITY ANGIOGRAPHY;  Surgeon: Algernon Huxley, MD;  Location: [place] [facility];  Service: Cardiovascular;  Laterality: Left;  . LOWER EXTREMITY ANGIOGRAPHY Right 12/20/2018   Procedure: Lower Extremity Angiography;  Surgeon: Algernon Huxley, MD;  Location: [place] [facility];  Service: Cardiovascular;  Laterality: Right;  . TRANSMETATARSAL AMPUTATION Bilateral 10/13/2018   Procedure: 1.  Amputation right great toe MTPJ 2.  Amputation right second toe MTPJ 3.  Amputation right fourth toe MTPJ 4.  Amputation right fifth toe MTPJ 5.  Excision distal second metatarsal head right foot 6.  Excision distal third metatarsal head right foot 7.  Amputation left third toe 8.  Incision and drainage infectionleft 2nd toe amputation site. ;  Surgeon: Samara Deist, DPM;  Location    Social History   Socioeconomic History  . Marital status: Widowed    Spouse name: Not on file  . Number of children: Not on file  . Years of education: Not on file  . Highest education level: Not on file  Occupational  History    Employer: RETIRED  Tobacco Use  . Smoking status: Current Some Day Smoker    Packs/day: 0.10    Types: Cigarettes  . Smokeless tobacco: Never Used  . Tobacco comment: pt currently smokes about 3 cigarettes a day   Vaping Use  . Vaping Use: Never used  Substance and Sexual Activity  . Alcohol use: Not Currently    Alcohol/week: 0.0 standard drinks  . Drug use: Never  . Sexual activity: Not Currently  Other Topics Concern  . Not on file  Social History Narrative  . Not on file   Social Determinants of Health   Financial Resource Strain: Not on file  Food Insecurity: Not on file  Transportation Needs: Not on file  Physical Activity: Not on file  Stress: Not on file  Social Connections: Not on file  Intimate Partner Violence: Not on file     Family History   Problem Relation Age of Onset  . Varicose Veins Neg Hx      Current Facility-Administered Medications:  .  0.9 %  sodium chloride infusion, 250 mL, Intravenous, PRN, Max Sane, MD .  acetaminophen (TYLENOL) tablet 650 mg, 650 mg, Oral, Q6H PRN, Max Sane, MD .  aspirin EC tablet 81 mg, 81 mg, Oral, Daily, Manuella Ghazi, Vipul, MD .  azithromycin (ZITHROMAX) 500 mg in sodium chloride 0.9 % 250 mL IVPB, 500 mg, Intravenous, Q24H, Benita Gutter, RPH, Last Rate: 250 mL/hr at 09/20/20 2223, 500 mg at 09/20/20 2223 .  cefTRIAXone (ROCEPHIN) 2 g in sodium chloride 0.9 % 100 mL IVPB, 2 g, Intravenous, Q24H, Benita Gutter, RPH, Last Rate: 200 mL/hr at 09/20/20 2052, 2 g at 09/20/20 2052 .  Chlorhexidine Gluconate Cloth 2 % PADS 6 each, 6 each, Topical, Q0600, Breeze, Shantelle, NP .  clopidogrel (PLAVIX) tablet 75 mg, 75 mg, Oral, Daily, Manuella Ghazi, Vipul, MD .  escitalopram (LEXAPRO) tablet 10 mg, 10 mg, Oral, Daily, Manuella Ghazi, Vipul, MD .  feeding supplement (NEPRO CARB STEADY) liquid 237 mL, 237 mL, Oral, BID BM, Max Sane, MD, 237 mL at 09/20/20 2030 .  gabapentin (NEURONTIN) capsule 100 mg, 100 mg, Oral, TID, Max Sane, MD, 100 mg at 09/20/20 2217 .  guaiFENesin (MUCINEX) 12 hr tablet 600 mg, 600 mg, Oral, BID, Manuella Ghazi, Vipul, MD, 600 mg at 09/20/20 2217 .  heparin injection 5,000 Units, 5,000 Units, Subcutaneous, Q8H, Max Sane, MD, 5,000 Units at 09/21/20 0554 .  Ipratropium-Albuterol (COMBIVENT) respimat 1 puff, 1 puff, Inhalation, Q6H, Max Sane, MD, 1 puff at 09/21/20 0219 .  [START ON 09/22/2020] levothyroxine (SYNTHROID) tablet 75 mcg, 75 mcg, Oral, QAC breakfast, Sharion Settler, NP .  losartan (COZAAR) tablet 50 mg, 50 mg, Oral, Daily, Manuella Ghazi, Vipul, MD .  MEDLINE mouth rinse, 15 mL, Mouth Rinse, BID, Max Sane, MD, 15 mL at 09/21/20 0219 .  metoprolol succinate (TOPROL-XL) 24 hr tablet 25 mg, 25 mg, Oral, QHS, Shah, Vipul, MD .  multivitamin (RENA-VIT) tablet 1 tablet, 1 tablet, Oral, QHS,  Max Sane, MD, 1 tablet at 09/20/20 2233 .  nicotine (NICODERM CQ - dosed in mg/24 hours) patch 21 mg, 21 mg, Transdermal, Daily, Max Sane, MD, 21 mg at 09/20/20 2014 .  ondansetron (ZOFRAN) tablet 4 mg, 4 mg, Oral, Q6H PRN, Manuella Ghazi, Vipul, MD .  oxyCODONE (Oxy IR/ROXICODONE) immediate release tablet 5 mg, 5 mg, Oral, Q6H PRN, Manuella Ghazi, Vipul, MD .  polyethylene glycol (MIRALAX / GLYCOLAX) packet 17 g, 17 g, Oral, Daily PRN, Max Sane, MD .  sodium chloride flush (NS) 0.9 % injection 3 mL, 3 mL, Intravenous, Q12H, Manuella Ghazi, Vipul, MD .  sodium chloride flush (NS) 0.9 % injection 3 mL, 3 mL, Intravenous, PRN, Manuella Ghazi, Vipul, MD .  tamsulosin (FLOMAX) capsule 0.4 mg, 0.4 mg, Oral, Daily, Manuella Ghazi, Vipul, MD .  torsemide (DEMADEX) tablet 100 mg, 100 mg, Oral, Daily, Max Sane, MD   Physical exam:  Vitals:   09/20/20 2223 09/21/20 0030 09/21/20 0231 09/21/20 0401  BP: 121/67 114/69 120/83 114/70  Pulse: (!) 50 63 62 (!) 55  Resp: 12 14 14 14   Temp: 97.8 F (36.6 C) 98.4 F (36.9 C) 98.2 F (36.8 C) 97.7 F (36.5 C)  TempSrc: Oral Axillary Oral Oral  SpO2: 100% 100% 98% 100%  Weight:      Height:       Physical Exam Constitutional:      General: He is not in acute distress.    Appearance: He is ill-appearing.  HENT:     Head: Normocephalic.     Mouth/Throat:     Pharynx: No oropharyngeal exudate.  Eyes:     General: No scleral icterus. Cardiovascular:     Rate and Rhythm: Normal rate.     Heart sounds: No murmur heard.   Pulmonary:     Effort: Pulmonary effort is normal. No respiratory distress.  Abdominal:     General: There is no distension.     Palpations: Abdomen is soft.     Tenderness: There is no abdominal tenderness.  Musculoskeletal:     Cervical back: Normal range of motion and neck supple.  Skin:    General: Skin is warm.  Neurological:     Motor: No abnormal muscle tone.     Comments: Lethargic  Psychiatric:        Mood and Affect: Affect normal.          CMP Latest Ref Rng & Units 09/21/2020  Glucose 70 - 99 mg/dL 67(L)  BUN 8 - 23 mg/dL 23  Creatinine 0.61 - 1.24 mg/dL 5.02(H)  Sodium 135 - 145 mmol/L 136  Potassium 3.5 - 5.1 mmol/L 4.5  Chloride 98 - 111 mmol/L 98  CO2 22 - 32 mmol/L 24  Calcium 8.9 - 10.3 mg/dL 10.2  Total Protein 6.5 - 8.1 g/dL 5.8(L)  Total Bilirubin 0.3 - 1.2 mg/dL 0.7  Alkaline Phos 38 - 126 U/L 27(L)  AST 15 - 41 U/L 25  ALT 0 - 44 U/L 9   CBC Latest Ref Rng & Units 09/21/2020  WBC 4.0 - 10.5 K/uL 7.1  Hemoglobin 13.0 - 17.0 g/dL 13.4  Hematocrit 39.0 - 52.0 % 39.6  Platelets 150 - 400 K/uL 104(L)    RADIOGRAPHIC STUDIES: I have personally reviewed the radiological images as listed and agreed with the findings in the report. DG Chest 2 View  Result Date: 09/01/2020 CLINICAL DATA:  Cough with weakness 1 week. EXAM: CHEST - 2 VIEW COMPARISON:  08/30/2020 and 06/05/2020 as well as CT chest 08/30/2020 FINDINGS: Lungs are adequately inflated with slight worsening hazy density over the left base/retrocardiac region with blunting of the left costophrenic angle as findings may be due to effusion/atelectasis versus infection. Subtle hazy prominence of the perihilar vessels unchanged. Stable cardiomegaly. Remainder of the exam is unchanged. IMPRESSION: 1. Slight worsening hazy density over the left base/retrocardiac region which may be due to effusion/atelectasis versus infection. 2. Stable cardiomegaly with suggestion of minimal vascular congestion. Electronically Signed   By: Marin Olp M.D.  On: 09/01/2020 12:31   DG Chest 2 View  Result Date: 08/30/2020 CLINICAL DATA:  Cough and weakness EXAM: CHEST - 2 VIEW COMPARISON:  June 05, 2020 FINDINGS: There is airspace opacity in portions of each mid lung and left lower lobe with left pleural effusion. There is cardiomegaly with pulmonary vascularity within normal limits. No adenopathy. No bone lesions. IMPRESSION: Airspace opacity consistent with pneumonia in  the left lower lung region as well as in each mid lung region. Check of COVID-19 status may be prudent in this circumstance. Left pleural effusion noted.  Stable cardiomegaly. Electronically Signed   By: Lowella Grip III M.D.   On: 08/30/2020 18:02   CT Head Wo Contrast  Result Date: 09/20/2020 CLINICAL DATA:  Dialysis patient with mental status changes. Lethargy. EXAM: CT HEAD WITHOUT CONTRAST TECHNIQUE: Contiguous axial images were obtained from the base of the skull through the vertex without intravenous contrast. COMPARISON:  03/08/2007 FINDINGS: Brain: Generalized atrophy. Chronic small-vessel ischemic changes of the pons. 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 Advanced chronic small vessel disease of the hemispheric white matter. No evidence of recent infarction, mass lesion, hemorrhage, hydrocephalus or extra-axial collection Vascular: There is atherosclerotic calcification of the major vessels at the base of the brain. Skull: Negative Sinuses/Orbits: Clear/normal Other: None IMPRESSION: No acute finding by CT. Advanced chronic small-vessel ischemic changes throughout the brain as outlined above. Electronically Signed   By: Nelson Chimes M.D.   On: 09/20/2020 12:44   CT Chest Wo Contrast  Result Date: 08/30/2020 CLINICAL DATA:  Pneumonia, effusion or abscess suspected, xray done Generalized weakness for 2 weeks. Radiologic records demonstrates history of lung cancer. EXAM: CT CHEST WITHOUT CONTRAST TECHNIQUE: Multidetector CT imaging of the chest was performed following the standard protocol without IV contrast. COMPARISON:  Radiograph earlier today. Staging chest CT 8 days ago 08/21/2020 FINDINGS: Cardiovascular: Aortic atherosclerosis and tortuosity. Focal outpouching of the left lateral wall of thoracic aorta on prior exam is grossly stable, but not well assessed on the current exam in the absence of IV  contrast. No periaortic stranding. Multi chamber cardiomegaly. Coronary artery calcifications. Small pericardial effusion is similar Mediastinum/Nodes: Patient had recent staging CT with IV contrast the. Right paratracheal lymph node measuring right lower paratracheal node measuring 2.3 cm, series 2, image 50, unchanged allowing for differences in technique and caliper placement. Subcarinal node measures 2.1 cm, series 2, image 71, previously 1.9 cm. Additional smaller lymph nodes are unchanged from prior exam. Hilar adenopathy is not well assessed on this noncontrast exam. Left epicardial node measures 1.2 cm, series 2, image 139, previously 1.1 cm. No obvious no adenopathy in the interim. Decompressed esophagus. Lungs/Pleura: Partially loculated left pleural effusion at the lung base this is not significantly changed in size. There is left lower lobe bronchial thickening. No definite acute airspace disease. There is mild scarring abutting the inter lobar fissure on the left which appears similar to prior exam. Spiculated left upper lobe pulmonary nodule measures 2.5 x 2 cm, series 3, image 27, unchanged. Spiculations extend to the pleural surface anterior laterally. Background emphysema. Patchy ground-glass opacity in the perifissural right upper lobe, series 3, image 66, with adjacent fissural thickening, progressed/new. Additional a ground-glass opacity in the perihilar right lower lobe. Slight basilar septal thickening. There is a trace right pleural effusion and seen on prior exam. Upper Abdomen: Bilateral renal parenchymal atrophy. Diverticulosis of the included colon. No acute upper  abdominal findings. Musculoskeletal: There are no acute or suspicious osseous abnormalities. IMPRESSION: 1. Patchy ground-glass opacity in the perifissural right upper and to a lesser extent right lower lobe. Differential considerations include pneumonitis including COVID-19 versus pulmonary edema. Small right pleural effusion is  new from CT 1 week ago. 2. Unchanged partially loculated left pleural effusion at the lung base. 3. Unchanged spiculated left upper lobe pulmonary nodule. Unchanged mediastinal adenopathy. Patient recently underwent staging CT. 4. Cardiomegaly with coronary artery calcifications. Small pericardial effusion is unchanged. Aortic Atherosclerosis (ICD10-I70.0) and Emphysema (ICD10-J43.9). Electronically Signed   By: Keith Rake M.D.   On: 08/30/2020 20:01   DG Chest Portable 1 View  Result Date: 09/20/2020 CLINICAL DATA:  Altered mental status. Dialysis patient. EXAM: PORTABLE CHEST 1 VIEW COMPARISON:  09/01/2020 FINDINGS: Stable enlarged cardiac silhouette. Small to moderate-sized left pleural effusion with an interval increase. Interval patchy opacity throughout the majority of the left lung. Stable prominence of the interstitial markings in the right lung. No pleural fluid on the right. Stable mild to moderate scoliosis. IMPRESSION: 1. Interval left lung probable multilobar pneumonia. 2. Small to moderate-sized left pleural effusion, increased. 3. Stable cardiomegaly and chronic interstitial lung disease. Electronically Signed   By: Claudie Revering M.D.   On: 09/20/2020 12:48    Assessment and plan-   #Stage IV metastatic lung cancer acute mental status, ESRD, multilobar pneumonia versus cancer progression Goals of care discussion Patient has had rapid decline of his condition.  Unfortunately he is not a candidate for further chemotherapy treatment at this point.Given his multiple comorbidities, performance status, I think hospice is appropriate. I had a discussion with the patient's sister and updated her my recommendation. I also called patient's son who is currently at work and not able to talk. Updated hospitalist team Dr. Manuella Ghazi and the palliative care service.   Thank you for allowing me to participate in the care of this patient.   Earlie Server, MD, PhD Hematology Oncology [facility] at [facility] Pager- [phone] 09/21/2020

## 2020-09-21 NOTE — Progress Notes (Signed)
PT Cancellation Note  Patient Details Name: TEYTON PATTILLO MRN: [id number] DOB: 06-08-1945   Cancelled Treatment:    Reason Eval/Treat Not Completed: Other (comment): Per nursing pt transitioning to comfort care.  Will complete PT orders at this time but will reassess pt pending a change in status upon receipt of new PT orders.    Linus Salmons PT, DPT 09/21/20, 11:28 AM

## 2020-09-21 NOTE — Progress Notes (Signed)
1        [facility] [place] NAME: Jose [name]    MR#:  [id number]  DATE OF BIRTH:  July 30, 1945  SUBJECTIVE:  CHIEF COMPLAINT:   Chief Complaint  Patient presents with  . Altered Mental Status  Patient agreeable to keep him comfortable and hospice.  Does not want to continue dialysis.  Son is also in agreement.  Sister is updated for same by oncology REVIEW OF SYSTEMS:  ROS unable to obtain due to his mental status DRUG ALLERGIES:   Allergies  Allergen Reactions  . Penicillin G Other (See Comments)    Other reaction(s): Unknown DID THE REACTION INVOLVE: Swelling of the face/tongue/throat, SOB, or low BP? Unknown Sudden or severe rash/hives, skin peeling, or the inside of the mouth or nose? Unknown Did it require medical treatment? Unknown When did it last happen?unknown If all above answers are "NO", may proceed with cephalosporin use.    VITALS:  Blood pressure 127/88, pulse (!) 55, temperature 97.8 F (36.6 C), temperature source Oral, resp. rate 16, height 6\' 1"  (1.854 m), weight 58 kg, SpO2 100 %. PHYSICAL EXAMINATION:  Physical Exam  GENERAL:  75 y.o.-year-old cachectic patient lying in the bed.  Intermittent confusion EYES: Pupils equal, round, reactive to light and accommodation. No scleral icterus. Extraocular muscles intact.  HEENT: Head atraumatic, normocephalic. Oropharynx and nasopharynx clear.  NECK:  Supple, no jugular venous distention. No thyroid enlargement, no tenderness.  LUNGS: Normal breath sounds bilaterally, no wheezing, rales,rhonchi or crepitation. No use of accessory muscles of respiration.  CARDIOVASCULAR: S1, S2 normal. No murmurs, rubs, or gallops.  ABDOMEN: Soft, nontender, nondistended. Bowel sounds present. No organomegaly or mass.  EXTREMITIES: No pedal edema, cyanosis, or clubbing.  NEUROLOGIC: Cranial nerves II through XII are intact. Muscle strength 5/5 in all extremities. Sensation intact. Gait not checked.   PSYCHIATRIC: The patient is awake but disoriented Dialysis access: Left arm AV fistula LABORATORY PANEL:  Male CBC Recent Labs  Lab 09/21/20 0520  WBC 7.1  HGB 13.4  HCT 39.6  PLT 104*   ------------------------------------------------------------------------------------------------------------------ Chemistries  Recent Labs  Lab 09/21/20 0520  NA 136  K 4.5  CL 98  CO2 24  GLUCOSE 67*  BUN 23  CREATININE 5.02*  CALCIUM 10.2  AST 25  ALT 9  ALKPHOS 27*  BILITOT 0.7   RADIOLOGY:  No results found. ASSESSMENT AND PLAN:  75 year old male with a known history of ESRD on hemodialysis, diabetes, hypertension, hyperlipidemia is admitted for acute metabolic encephalopathy likely due to underlying pneumonia.  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  Net IO Since Admission: 340 mL [09/21/20 1254]    Patient and his son/Jose [name] agreeable for comfort care and not wanting to continue hemodialysis at this point.  I have placed comfort care orders.  He is agreeable for DNR.  Patient sister is updated by oncology who is in agreement as well.  I have also discussed with all the providers involved in his care and are on the same page.  Status is: Inpatient  Remains inpatient appropriate because:Altered mental status   Dispo: The patient is from: Home              Anticipated d/c  is to: Home              Patient currently is not medically stable to d/c.   Difficult to place patient No       DVT prophylaxis:       heparin injection 5,000 Units Start: 09/20/20 2200 SCDs Start: 09/20/20 1351     Family Communication:  Discussed with patient's son Jose [name] over phone at [phone] on 09/21/2020   All the records are reviewed and case discussed with Care Management/Social Worker. Management plans discussed with the patient, family and they are in agreement.  CODE STATUS: DNR Level of care: Med-Surg  TOTAL TIME TAKING CARE OF THIS PATIENT: 35 minutes.   More than 50% of the time was spent in counseling/coordination of care: YES  POSSIBLE D/C IN 1-2 DAYS, DEPENDING ON CLINICAL CONDITION.   Max Sane M.D on 09/21/2020 at 12:54 PM  [facility]   CC: Primary care physician; [name], MD  Note: This dictation was prepared with Dragon dictation along with smaller phrase technology. Any transcriptional errors that result from this process are unintentional.

## 2020-09-21 NOTE — Evaluation (Signed)
Physical Therapy Evaluation Patient Details Name: Jose [name] MRN: [id number] DOB: [birth year] Today's Date: 09/21/2020   History of Present Illness  Pt is a 75 y.o. male with a known history of ESRD on hemodialysis, diabetes, hypertension, hyperlipidemia sent to the emergency department from dialysis for worsening weakness.  While at dialysis he was reportedly very confused and altered with a similar history in the past when he has any infection.  MD assessment includes: Acute metabolic encephalopathy, PNA, ESRD, hyperkalemia, and weakness.    Clinical Impression  Pt was pleasant but lethargic during the session.  Pt was able to follow commands, provide history, and put forth fair effort during the session but ultimately was very weak functionally.  Pt required significant physical assistance with bed mobility tasks and was unable to consistently maintain static sitting balance at the EOB.  Pt tended to lean posteriorly with no improvement after anterior weight shifting efforts.  Pt would not be safe to return to his prior living situation at this time and will benefit from PT services in a SNF setting upon discharge to safely address deficits listed in patient problem list for decreased caregiver assistance and eventual return to PLOF.      Follow Up Recommendations SNF;Supervision/Assistance - 24 hour    Equipment Recommendations  None recommended by PT    Recommendations for Other Services       Precautions / Restrictions Precautions Precautions: Fall Restrictions Weight Bearing Restrictions: No Other Position/Activity Restrictions: HOB to > 30 deg      Mobility  Bed Mobility Overal bed mobility: Needs Assistance Bed Mobility: Supine to Sit;Sit to Supine     Supine to sit: Mod assist Sit to supine: Mod assist   General bed mobility comments: Mod A for BLE and trunk control    Transfers                 General transfer comment: Unable/unsafe to attempt  secondary to poor sitting balance with posterior lean  Ambulation/Gait                Stairs            Wheelchair Mobility    Modified Rankin (Stroke Patients Only)       Balance Overall balance assessment: Needs assistance Sitting-balance support: Feet supported;Bilateral upper extremity supported Sitting balance-Leahy Scale: Poor Sitting balance - Comments: Frequent min to mod A to prevent posterior LOB in sitting       Standing balance comment: Unable/unsafe to attempt                             Pertinent Vitals/Pain Pain Assessment: No/denies pain    Home Living Family/patient expects to be discharged to:: Private residence Living Arrangements: Other relatives Available Help at Discharge: Family;Available PRN/intermittently Type of Home: House Home Access: Stairs to enter Entrance Stairs-Rails: Can reach both Entrance Stairs-Number of Steps: 3 Home Layout: One level Home Equipment: Walker - 2 wheels;Cane - single point;Wheelchair - manual      Prior Function Level of Independence: Needs assistance   Gait / Transfers Assistance Needed: Mod Ind amb with a RW in the home and to HD, w/c for longer community distances, one fall in the last 6 months secondary to LOB  ADL's / Homemaking Assistance Needed: Sister assists with IADLs        Hand Dominance   Dominant Hand: Right    Extremity/Trunk Assessment   Upper Extremity  Assessment Upper Extremity Assessment: Overall WFL for tasks assessed    Lower Extremity Assessment Lower Extremity Assessment: Overall WFL for tasks assessed       Communication   Communication: No difficulties  Cognition Arousal/Alertness: Lethargic Behavior During Therapy: WFL for tasks assessed/performed Overall Cognitive Status: Within Functional Limits for tasks assessed                                        General Comments      Exercises Total Joint Exercises Ankle  Circles/Pumps: AROM;Strengthening;Both;5 reps;10 reps Quad Sets: Strengthening;Both;5 reps;10 reps Heel Slides: AAROM;Strengthening;Both;10 reps Hip ABduction/ADduction: AAROM;Strengthening;Both;10 reps Straight Leg Raises: AAROM;Strengthening;Both;10 reps Long Arc Quad: AROM;Strengthening;Both;10 reps Knee Flexion: AROM;Strengthening;Both;10 reps Other Exercises Other Exercises: Anterior weight shifting in sitting to address posterior instability   Assessment/Plan    PT Assessment Patient needs continued PT services  PT Problem List Decreased strength;Decreased mobility;Decreased activity tolerance;Decreased balance;Decreased knowledge of use of DME       PT Treatment Interventions Gait training;Stair training;Functional mobility training;Therapeutic activities;Therapeutic exercise;Balance training;DME instruction;Patient/family education    PT Goals (Current goals can be found in the Care Plan section)  Acute Rehab PT Goals Patient Stated Goal: To get stronger PT Goal Formulation: With patient Time For Goal Achievement: 10/04/20 Potential to Achieve Goals: Fair    Frequency Min 2X/week   Barriers to discharge Inaccessible home environment;Decreased caregiver support      Co-evaluation               AM-PAC PT "6 Clicks" Mobility  Outcome Measure Help needed turning from your back to your side while in a flat bed without using bedrails?: A Lot Help needed moving from lying on your back to sitting on the side of a flat bed without using bedrails?: A Lot Help needed moving to and from a bed to a chair (including a wheelchair)?: Total Help needed standing up from a chair using your arms (e.g., wheelchair or bedside chair)?: Total Help needed to walk in hospital room?: Total Help needed climbing 3-5 steps with a railing? : Total 6 Click Score: 8    End of Session   Activity Tolerance: Patient tolerated treatment well Patient left: in bed;with call bell/phone within  reach;with bed alarm set Nurse Communication: Mobility status PT Visit Diagnosis: Muscle weakness (generalized) (M62.81);Difficulty in walking, not elsewhere classified (R26.2);History of falling (Z91.81)    Time: 2334-3568 PT Time Calculation (min) (ACUTE ONLY): 29 min   Charges:   PT Evaluation $PT Eval Moderate Complexity: 1 Mod PT Treatments $Therapeutic Exercise: 8-22 mins       D. Scott Taccara Bushnell PT, DPT 09/21/20, 11:25 AM

## 2020-09-21 NOTE — Progress Notes (Addendum)
[facility] [facility]) Hospital Liaison RN note:  Received referral from Dr. Manuella Ghazi and Doran Clay, [facility] for family interest in [place]. Chart reviewed and eligibility was approved. Call to son, [name] to confirm interest and explain services. No answer. LVM. Called and spoke with sister, [name]. She stated that [name] would be the one to contact to complete paperwork if a room becomes available. Unfortunately, there is not a room to offer at the [facility] today. Hospital care team is aware. If a room becomes available over the weekend the [facility] will reach out to weekend [facility] to arrange transfer.  Please call with any hospice related questions or concerns.  Thank you for the opportunity to participate in this patient's care.  Zandra Abts, RN [facility] Liaison [phone]

## 2020-09-21 NOTE — Progress Notes (Signed)
Established hemodialysis patient known at [facility] 11:15am. Uses [facility] for transportation. Please call me with any placement related questions.  [name] Dialysis Coordinator [phone]

## 2020-09-21 NOTE — Progress Notes (Signed)
[place], [place] 09/21/20  Subjective:   LOS: 1 Jose [name] is a 75 y.o. male with past medical history of .diabetes, chronic kidney disease, hypertension. and Hyperlipidemia. He was sent to the ED from his outpatient dialysis center with altered mental status and lethargy.   Patient was seen laying in bed, somnolent Aroused, but drowsy  Objective:  Vital signs in last 24 hours:  Temp:  [97.7 F (36.5 C)-98.4 F (36.9 C)] 97.8 F (36.6 C) (04/22 0839) Pulse Rate:  [50-81] 55 (04/22 0839) Resp:  [10-23] 16 (04/22 0839) BP: (114-148)/(67-95) 127/88 (04/22 0839) SpO2:  [92 %-100 %] 100 % (04/22 0401) Weight:  [58 kg] 58 kg (04/21 1943)  Weight change:  Filed Weights   09/20/20 1144 09/20/20 1943  Weight: 59 kg 58 kg    Intake/Output:    Intake/Output Summary (Last 24 hours) at 09/21/2020 1153 Last data filed at 09/21/2020 0500 Gross per 24 hour  Intake 487 ml  Output 147 ml  Net 340 ml    Physical Exam: General:  No acute distress, laying in the bed, ill-appearing  HEENT  anicteric, moist oral mucous membrane  Pulm/lungs  normal breathing effort, diminished in bases, room air  CVS/Heart  regular rhythm, no rub or gallop  Abdomen:   Soft, nontender  Extremities:  + peripheral edema  Neurologic:  Drowsy  Skin:  No acute rashes  Left arm aneursmal AVF     Basic Metabolic Panel:  Recent Labs  Lab 09/20/20 1156 09/21/20 0520  NA 133* 136  K 5.2* 4.5  CL 95* 98  CO2 27 24  GLUCOSE 103* 67*  BUN 29* 23  CREATININE 5.73* 5.02*  CALCIUM 11.1* 10.2     CBC: Recent Labs  Lab 09/20/20 1156 09/20/20 1938 09/21/20 0520  WBC 6.5 7.6 7.1  NEUTROABS 4.6  --   --   HGB 13.0 13.8 13.4  HCT 39.7 43.1 39.6  MCV 98.5 100.0 96.4  PLT 127* 125* 104*      Lab Results  Component Value Date   HEPBSAG NON REACTIVE 09/01/2020   HEPBSAB Reactive 03/22/2018   HEPBIGM Negative 03/22/2018      Microbiology:  Recent Results  (from the past 240 hour(s))  Blood culture (routine x 2)     Status: None (Preliminary result)   Collection Time: 09/20/20  1:52 PM   Specimen: BLOOD  Result Value Ref Range Status   Specimen Description BLOOD RIGHT ANTECUBITAL  Final   Special Requests   Final    BOTTLES DRAWN AEROBIC AND ANAEROBIC Blood Culture results may not be optimal due to an excessive volume of blood received in culture bottles   Culture   Final    NO GROWTH < 24 HOURS Performed at [facility], [address] [place], [place] [address]    Report Status PENDING  Incomplete  Blood culture (routine x 2)     Status: None (Preliminary result)   Collection Time: 09/20/20  1:52 PM   Specimen: BLOOD  Result Value Ref Range Status   Specimen Description BLOOD BLOOD RIGHT ARM  Final   Special Requests   Final    BOTTLES DRAWN AEROBIC AND ANAEROBIC Blood Culture adequate volume   Culture   Final    NO GROWTH < 24 HOURS Performed at [facility], [address] [place], [place] [address]    Report Status PENDING  Incomplete  Resp Panel by RT-PCR (Flu A&B, Covid) Nasopharyngeal Swab  Status: None   Collection Time: 09/20/20  1:54 PM   Specimen: Nasopharyngeal Swab; Nasopharyngeal(NP) swabs in vial transport medium  Result Value Ref Range Status   SARS Coronavirus 2 by RT PCR NEGATIVE NEGATIVE Final    Comment: (NOTE) SARS-CoV-2 target nucleic acids are NOT DETECTED.  The SARS-CoV-2 RNA is generally detectable in upper respiratory specimens during the acute phase of infection. The lowest concentration of SARS-CoV-2 viral copies this assay can detect is 138 copies/mL. A negative result does not preclude SARS-Cov-2 infection and should not be used as the sole basis for treatment or other patient management decisions. A negative result may occur with  improper specimen collection/handling, submission of specimen other than nasopharyngeal swab, presence of viral mutation(s) within the areas  targeted by this assay, and inadequate number of viral copies(<138 copies/mL). A negative result must be combined with clinical observations, patient history, and epidemiological information. The expected result is Negative.  Fact Sheet for Patients:  EntrepreneurPulse.com.au  Fact Sheet for Healthcare Providers:  IncredibleEmployment.be  This test is no t yet approved or cleared by the [place] FDA and  has been authorized for detection and/or diagnosis of SARS-CoV-2 by FDA under an Emergency Use Authorization (EUA). This EUA will remain  in effect (meaning this test can be used) for the duration of the COVID-19 declaration under Section 564(b)(1) of the Act, 21 U.S.C.section 360bbb-3(b)(1), unless the authorization is terminated  or revoked sooner.       Influenza A by PCR NEGATIVE NEGATIVE Final   Influenza B by PCR NEGATIVE NEGATIVE Final    Comment: (NOTE) The Xpert Xpress SARS-CoV-2/FLU/RSV plus assay is intended as an aid in the diagnosis of influenza from Nasopharyngeal swab specimens and should not be used as a sole basis for treatment. Nasal washings and aspirates are unacceptable for Xpert Xpress SARS-CoV-2/FLU/RSV testing.  Fact Sheet for Patients: EntrepreneurPulse.com.au  Fact Sheet for Healthcare Providers: IncredibleEmployment.be  This test is not yet approved or cleared by the [place] FDA and has been authorized for detection and/or diagnosis of SARS-CoV-2 by FDA under an Emergency Use Authorization (EUA). This EUA will remain in effect (meaning this test can be used) for the duration of the COVID-19 declaration under Section 564(b)(1) of the Act, 21 U.S.C. section 360bbb-3(b)(1), unless the authorization is terminated or revoked.  Performed at [facility], [address] [place], [place] [address]     Coagulation Studies: No results for input(s): LABPROT,  INR in the last 72 hours.  Urinalysis: No results for input(s): COLORURINE, LABSPEC, PHURINE, GLUCOSEU, HGBUR, BILIRUBINUR, KETONESUR, PROTEINUR, UROBILINOGEN, NITRITE, LEUKOCYTESUR in the last 72 hours.  Invalid input(s): APPERANCEUR    Imaging: CT Head Wo Contrast  Result Date: 09/20/2020 CLINICAL DATA:  Dialysis patient with mental status changes. Lethargy. EXAM: CT HEAD WITHOUT CONTRAST TECHNIQUE: Contiguous axial images were obtained from the base of the skull through the vertex without intravenous contrast. COMPARISON:  03/08/2007 FINDINGS: Brain: Generalized atrophy. Chronic small-vessel ischemic changes of the pons. 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 Advanced chronic small vessel disease of the hemispheric white matter. No evidence of recent infarction, mass lesion, hemorrhage, hydrocephalus or extra-axial collection Vascular: There is atherosclerotic calcification of the major vessels at the base of the brain. Skull: Negative Sinuses/Orbits: Clear/normal Other: None IMPRESSION: No acute finding by CT. Advanced chronic small-vessel ischemic changes throughout the brain as outlined above. Electronically Signed   By: Nelson Chimes  M.D.   On: 09/20/2020 12:44   DG Chest Portable 1 View  Result Date: 09/20/2020 CLINICAL DATA:  Altered mental status. Dialysis patient. EXAM: PORTABLE CHEST 1 VIEW COMPARISON:  09/01/2020 FINDINGS: Stable enlarged cardiac silhouette. Small to moderate-sized left pleural effusion with an interval increase. Interval patchy opacity throughout the majority of the left lung. Stable prominence of the interstitial markings in the right lung. No pleural fluid on the right. Stable mild to moderate scoliosis. IMPRESSION: 1. Interval left lung probable multilobar pneumonia. 2. Small to moderate-sized left pleural effusion, increased. 3. Stable cardiomegaly and chronic interstitial lung  disease. Electronically Signed   By: Claudie Revering M.D.   On: 09/20/2020 12:48     Medications:    Scheduled Meds: Continuous Infusions: PRN Meds:.    Assessment/ Plan:  75 y.o. male with past medical history of .diabetes, chronic kidney disease, hypertension. and Hyperlipidemia.was admitted on 09/20/2020 for   Active Problems:   Pneumonia  Pneumonia [J18.9] HCAP (healthcare-associated pneumonia) [J18.9] x 2 weeks   [facility] TuThSa-2  wt 66.5 kg  #. ESRD -received dialysis yesterday - UF 147 ml - Per primary team, spoke with son and have agreed to have care withdrawn. Awaiting Hospice consult for confirmation.  - Patient unable to answer questions abut his care.   #. Anemia of CKD  Lab Results  Component Value Date   HGB 13.4 09/21/2020   Avoid Epogen as patient has known malignancy Receives chemotherapy and is followed by Dr. Tasia Catchings at the cancer center  #. Secondary hyperparathyroidism of renal origin N 25.81      Component Value Date/Time   PTH 226 (H) 03/25/2018 1557   Lab Results  Component Value Date   PHOS 3.3 09/01/2020  Continue Calcium acetate with meals Phosphorus at goal  # Hypertension - Elevated - Continue home regimen of Losartan and Metoprolol   LOS: [place] 4/22/202211:53 [place], [place]

## 2020-09-21 NOTE — Progress Notes (Signed)
OT Cancellation Note  Patient Details Name: Jose [name] MRN: [id number] DOB: 03-Jul-1945   Cancelled Treatment:    Reason Eval/Treat Not Completed: Other (comment)OT order received and chart reviewed. Per discussion with PT and team, pt will be going on comfort care measures. OT to complete order and SIGN OFF.   Darleen Crocker, [place], OTR/L , CBIS ascom [phone]  09/21/20, 10:06 AM   09/21/2020, 10:05 AM

## 2020-09-21 NOTE — Progress Notes (Signed)
   09/20/20 2223  Assess: MEWS Score  Temp 97.8 F (36.6 C)  BP 121/67  Pulse Rate (!) 50  Resp 12  Level of Consciousness Responds to Voice  SpO2 100 %  O2 Device Nasal Cannula  O2 Flow Rate (L/min) 2 L/min  Assess: MEWS Score  MEWS Temp 0  MEWS Systolic 0  MEWS Pulse 1  MEWS RR 1  MEWS LOC 1  MEWS Score 3  MEWS Score Color Yellow  Assess: if the MEWS score is Yellow or Red  Were vital signs taken at a resting state? Yes  Focused Assessment No change from prior assessment  Early Detection of Sepsis Score *See Row Information* Low  MEWS guidelines implemented *See Row Information* Yes  Treat  MEWS Interventions Escalated (See documentation below)  Pain Scale 0-10  Pain Score 0  Take Vital Signs  Increase Vital Sign Frequency  Yellow: Q 2hr X 2 then Q 4hr X 2, if remains yellow, continue Q 4hrs  Escalate  MEWS: Escalate Yellow: discuss with charge nurse/RN and consider discussing with provider and RRT  Notify: Charge Nurse/RN  Name of Charge Nurse/RN Notified Jackie, RN  Date Charge Nurse/RN Notified 09/20/20  Time Charge Nurse/RN Notified 2230  Notify: Provider  Provider Name/Title Sharion Settler, NP  Date Provider Notified 09/20/20  Time Provider Notified 2245  Notification Type  (secure chat)  Notification Reason Other (Comment) (yellow MEWS)  Provider response See new orders (add telemetry)  Date of Provider Response 09/20/20  Time of Provider Response 2315  LOC unchanged since arrival in ED. Rachael Fee NP notified and telemetry was applied to patient. Beta Blocker was held. Patients heart rate continued to go down to 50s throughout the rest of the night intermittently. NP aware.

## 2020-09-21 NOTE — TOC Initial Note (Signed)
Transition of Care [facility]) - Initial/Assessment Note    Patient Details  Name: Jose [name] MRN: [id number] Date of Birth: 05/08/1946  Transition of Care [facility]) CM/SW Contact:    Shelbie Hutching, RN Phone Number: 09/21/2020, 3:26 PM  Clinical Narrative:                 Patient admitted to the hospital with pneumonia.  Patient is ESRD on hemodialysis.  Patient has been at [facility] for rehab since last hospital discharge 09/04/20.  Patient and family have decided on comfort care and patient no longer wants to continue with dialysis treatments.  RNCM has left a message with [name] to discuss hospice home.  RNCM did speak with patient's sister, who, patient has been living with and she and her daughter are okay with [place].  Kieth Brightly with [facility] given Hospice Home Referral.    Expected Discharge Plan: [place] Barriers to Discharge: Hospice Bed not available   Patient Goals and CMS Choice Patient states their goals for this hospitalization and ongoing recovery are:: Family and patient have decided on comfort care and hospice CMS Medicare.gov Compare Post Acute Care list provided to:: Patient Represenative (must comment) Choice offered to / list presented to : Adult Children,Sibling  Expected Discharge Plan and Services Expected Discharge Plan: [place] In-house Referral: Hospice / Palliative Care Discharge Planning Services: CM Consult Post Acute Care Choice: Hospice Living arrangements for the past 2 months: (S) Single Family Home                 DME Arranged: N/A DME Agency: NA       HH Arranged: NA          Prior Living Arrangements/Services Living arrangements for the past 2 months: (S) Single Family Home Lives with:: Siblings (sister) Patient language and need for interpreter reviewed:: Yes Do you feel safe going back to the place where you live?: Yes      Need for Family Participation in Patient  Care: Yes (Comment) Care giver support system in place?: Yes (comment) Current home services: DME (walker and wheelchair) Criminal Activity/Legal Involvement Pertinent to Current Situation/Hospitalization: No - Comment as needed  Activities of Daily Living      Permission Sought/Granted Permission sought to share information with : Case Manager,Family Chief Financial Officer Permission granted to share information with : Yes, Verbal Permission Granted  Share Information with NAME: [name]     Permission granted to share info w Relationship: son  Permission granted to share info w Contact Information: [phone]  Emotional Assessment Appearance:: Appears stated age Attitude/Demeanor/Rapport: Lethargic   Orientation: : Oriented to Self,Oriented to Place,Oriented to Situation Alcohol / Substance Use: Not Applicable Psych Involvement: No (comment)  Admission diagnosis:  Pneumonia [J18.9] HCAP (healthcare-associated pneumonia) [J18.9] Patient Active Problem List   Diagnosis Date Noted  . Altered mental status   . HCAP (healthcare-associated pneumonia) 09/20/2020  . Protein-calorie malnutrition, severe 09/03/2020  . Hypervolemia   . Generalized weakness   . Hypothyroidism   . Physical deconditioning 09/01/2020  . Weight loss 07/11/2020  . Encounter for antineoplastic immunotherapy 02/15/2020  . Goals of care, counseling/discussion 12/23/2019  . 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 Malignant neoplasm of upper lobe of left lung ([place]) 12/23/2019  . Thickening of wall of gallbladder   . Primary malignant neoplasm of lung metastatic to other site [facility])   . Hypokalemia 10/30/2019  . Chronic diastolic CHF (congestive heart failure) (  [place]) 10/30/2019  . Benign essential HTN 10/30/2019  . Malignant neoplasm of unknown origin ([place]) 10/30/2019  . Acute respiratory failure with hypoxia ([place]) 10/30/2019  . End-stage renal disease on hemodialysis ([place])  10/30/2019  . Type 2 diabetes mellitus with hyperlipidemia ([place]) 10/30/2019  . SOB (shortness of breath) 10/29/2019  . Pulmonary edema 10/22/2019  . Diarrhea 10/22/2019  . Acute on chronic diastolic CHF (congestive heart failure) ([place]) 10/22/2019  . HLD (hyperlipidemia)   . Stroke ([place])   . Anxiety and depression   . Tobacco abuse   . Pleural effusion on left   . Acute urinary retention   . Foot ulcer ([place]) 12/15/2018  . Ischemic foot 10/11/2018  . Atherosclerosis of native arteries of the extremities with gangrene ([place]) 07/09/2018  . Gangrene of toe of right foot ([place]) 06/13/2018  . Type II diabetes mellitus with renal manifestations ([place]) 04/16/2018  . Hypertension 04/16/2018  . ESRD on dialysis ([place]) 04/16/2018  . Malnutrition of moderate degree 03/27/2018  . Renal failure 03/21/2018   PCP:  [name], MD Pharmacy:   [id number] [place], [address] [place] [address] Phone: [phone] Fax: [phone]  [facility] [address] [place] [address] Phone: [phone] Fax: [phone]     Social Determinants of Health (SDOH) Interventions    Readmission Risk Interventions Readmission Risk Prevention Plan 09/02/2020 12/20/2018 10/15/2018  Transportation Screening Complete Complete Complete  PCP or Specialist Appt within 3-5 Days - - (No Data)  [place] or [place] - Complete -  Social Work Consult for [place] Planning/Counseling - - -  [place] - - -  Medication Review Press photographer) Complete Complete -  PCP or Specialist appointment within 3-5 days of discharge Complete - -  [place] or [place] Not Complete - -  [place] or Home Care Consult Pt Refusal Comments SNF recommended - -  Palliative Care Screening Not Applicable - -  Skilled Nursing Facility Complete - -  Some recent data might be hidden

## 2020-09-21 NOTE — Progress Notes (Signed)
[facility] Nurse reports atrial fib on monitor.  Appears no recorded history but did have atrial flutter noted on ekg from presentation yesterday. Vrate currently in 57's Rhythm likely related to fluid status however the lower rate necessitating holding of his BB therapy likely secondary to hypothyroid state with TSH above 12. Levothyroxine dose 50 mcg daily on admission.  This has been increased to 75 mcg daily and an additional 50 mcg this am Will need follow up labs in 4 weeks given his age

## 2020-09-22 NOTE — Plan of Care (Signed)
  Problem: Nutrition: Goal: Adequate nutrition will be maintained Outcome: Not Progressing Comfort Measures in place; PO intake when pt wants it

## 2020-09-22 NOTE — TOC Progression Note (Signed)
Transition of Care [facility]) - Progression Note    Patient Details  Name: CORNELLIUS KROPP MRN: [id number] Date of Birth: [birth year]  Transition of Care [facility]) CM/SW Contact  Izola Price, RN Phone Number: 09/22/2020, 10:52 AM  Clinical Narrative:   Per hospice liaison, Zandra Abts, notes on 4/22 455 pm, hospice referral received but no bed available on 4/22. Notes indicate that, "Hospital care team is aware. If a room becomes available over the weekend the [place] will reach out to weekend [facility] to arrange transfer".   Communicated this to provider inquiry this am. Will attempt to reach out to triage to check on availability over the weekend.  Simmie Davies RN CM [phone] 1055 am.      Expected Discharge Plan: [place] Barriers to Discharge: Hospice Bed not available  Expected Discharge Plan and Services Expected Discharge Plan: [place] In-house Referral: Hospice / Palliative Care Discharge Planning Services: CM Consult Post Acute Care Choice: Hospice Living arrangements for the past 2 months: (S) Single Family Home                 DME Arranged: N/A DME Agency: NA       HH Arranged: NA           Social Determinants of Health (SDOH) Interventions    Readmission Risk Interventions Readmission Risk Prevention Plan 09/02/2020 12/20/2018 10/15/2018  Transportation Screening Complete Complete Complete  PCP or Specialist Appt within 3-5 Days - - (No Data)  [place] or [place] - Complete -  Social Work Consult for [place] Planning/Counseling - - -  Palliative Care Screening - - -  Medication Review Press photographer) Complete Complete -  PCP or Specialist appointment within 3-5 days of discharge Complete - -  [place] or [place] Not Complete - -  [place] or Home Care Consult Pt Refusal Comments SNF recommended - -  Palliative Care Screening Not Applicable - -  Skilled Nursing Facility Complete - -  Some recent data might be  hidden

## 2020-09-22 NOTE — Progress Notes (Signed)
1        [place] at [place] NAME: Jose [name]    MR#:  [id number]  DATE OF BIRTH:  [birth year]  SUBJECTIVE:  CHIEF COMPLAINT:   Chief Complaint  Patient presents with  . Altered Mental Status  comfortable, waiting for hospice home bed REVIEW OF SYSTEMS:  ROS unable to obtain due to his mental status DRUG ALLERGIES:   Allergies  Allergen Reactions  . Penicillin G Other (See Comments)    Other reaction(s): Unknown DID THE REACTION INVOLVE: Swelling of the face/tongue/throat, SOB, or low BP? Unknown Sudden or severe rash/hives, skin peeling, or the inside of the mouth or nose? Unknown Did it require medical treatment? Unknown When did it last happen?unknown If all above answers are "NO", may proceed with cephalosporin use.    VITALS:  Blood pressure 129/71, pulse (!) 53, temperature (!) 97.5 F (36.4 C), temperature source Oral, resp. rate 14, height 6\' 1"  (1.854 m), weight 58 kg, SpO2 100 %. PHYSICAL EXAMINATION:  Physical Exam  GENERAL:  75 y.o.-year-old cachectic patient lying in the bed.  Intermittent confusion EYES: Pupils equal, round, reactive to light and accommodation. No scleral icterus. Extraocular muscles intact.  HEENT: Head atraumatic, normocephalic. Oropharynx and nasopharynx clear.  NECK:  Supple, no jugular venous distention. No thyroid enlargement, no tenderness.  LUNGS: Normal breath sounds bilaterally, no wheezing, rales,rhonchi or crepitation. No use of accessory muscles of respiration.  CARDIOVASCULAR: S1, S2 normal. No murmurs, rubs, or gallops.  ABDOMEN: Soft, nontender, nondistended. Bowel sounds present. No organomegaly or mass.  EXTREMITIES: No pedal edema, cyanosis, or clubbing.  NEUROLOGIC: Nonfocal exam PSYCHIATRIC: The patient is awake but disoriented Dialysis access: Left arm AV fistula LABORATORY PANEL:  Male CBC Recent Labs  Lab 09/21/20 0520  WBC 7.1  HGB 13.4  HCT 39.6  PLT 104*    ------------------------------------------------------------------------------------------------------------------ Chemistries  Recent Labs  Lab 09/21/20 0520  NA 136  K 4.5  CL 98  CO2 24  GLUCOSE 67*  BUN 23  CREATININE 5.02*  CALCIUM 10.2  AST 25  ALT 9  ALKPHOS 27*  BILITOT 0.7   RADIOLOGY:  No results found. ASSESSMENT AND PLAN:  75 year old male with a known history of ESRD on hemodialysis, diabetes, hypertension, hyperlipidemia is admitted for acute metabolic encephalopathy likely due to underlying pneumonia.  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  Net IO Since Admission: -90 mL [09/22/20 1233]    Waiting for hospice home bed - none available today per [facility]  Status is: Inpatient  Remains inpatient appropriate because:Altered mental status   Dispo: The patient is from: Home              Anticipated d/c is to: Home              Patient currently is not medically stable to d/c.   Difficult to place patient No       DVT prophylaxis:       SCDs Start: 09/20/20 1351     Family Communication: Discussed with patient's son [name] over phone at [phone] on 09/21/2020   All the records are reviewed and case discussed with Care Management/Social Worker. Management plans discussed with the patient, family and they are  in agreement.  CODE STATUS: DNR Level of care: Med-Surg  TOTAL TIME TAKING CARE OF THIS PATIENT: 35 minutes.   More than 50% of the time was spent in counseling/coordination of care: YES  POSSIBLE D/C IN 1-2 DAYS, DEPENDING ON CLINICAL CONDITION.   Max Sane M.D on 09/22/2020 at 12:33  PM  [facility]   CC: Primary care physician; [name], MD  Note: This dictation was prepared with Dragon dictation along with smaller phrase technology. Any transcriptional errors that result from this process are unintentional.

## 2020-09-23 NOTE — Progress Notes (Signed)
1        [place] at [place] NAME: Jose [name]    MR#:  [id number]  DATE OF BIRTH:  01-Dec-1945  SUBJECTIVE:  CHIEF COMPLAINT:   Chief Complaint  Patient presents with  . Altered Mental Status  comfortable, still waiting for hospice home bed REVIEW OF SYSTEMS:  ROS unable to obtain due to his mental status DRUG ALLERGIES:   Allergies  Allergen Reactions  . Penicillin G Other (See Comments)    Other reaction(s): Unknown DID THE REACTION INVOLVE: Swelling of the face/tongue/throat, SOB, or low BP? Unknown Sudden or severe rash/hives, skin peeling, or the inside of the mouth or nose? Unknown Did it require medical treatment? Unknown When did it last happen?unknown If all above answers are "NO", may proceed with cephalosporin use.    VITALS:  Blood pressure (!) 147/74, pulse (!) 57, temperature (!) 97.4 F (36.3 C), temperature source Oral, resp. rate 14, height 6\' 1"  (1.854 m), weight 58 kg, SpO2 100 %. PHYSICAL EXAMINATION:  Physical Exam  GENERAL:  75 y.o.-year-old cachectic patient lying in the bed.  Intermittent confusion EYES: Pupils equal, round, reactive to light and accommodation. No scleral icterus. Extraocular muscles intact.  HEENT: Head atraumatic, normocephalic. Oropharynx and nasopharynx clear.  NECK:  Supple, no jugular venous distention. No thyroid enlargement, no tenderness.  LUNGS: Normal breath sounds bilaterally, no wheezing, rales,rhonchi or crepitation. No use of accessory muscles of respiration.  CARDIOVASCULAR: S1, S2 normal. No murmurs, rubs, or gallops.  ABDOMEN: Soft, nontender, nondistended. Bowel sounds present. No organomegaly or mass.  EXTREMITIES: No pedal edema, cyanosis, or clubbing.  NEUROLOGIC: Nonfocal exam PSYCHIATRIC: The patient is awake but disoriented Dialysis access: Left arm AV fistula LABORATORY PANEL:  Male CBC Recent Labs  Lab 09/21/20 0520  WBC 7.1  HGB 13.4  HCT 39.6  PLT 104*    ------------------------------------------------------------------------------------------------------------------ Chemistries  Recent Labs  Lab 09/21/20 0520  NA 136  K 4.5  CL 98  CO2 24  GLUCOSE 67*  BUN 23  CREATININE 5.02*  CALCIUM 10.2  AST 25  ALT 9  ALKPHOS 27*  BILITOT 0.7   RADIOLOGY:  No results found. ASSESSMENT AND PLAN:  75 year old male with a known history of ESRD on hemodialysis, diabetes, hypertension, hyperlipidemia is admitted for acute metabolic encephalopathy likely due to underlying pneumonia.  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  Net IO Since Admission: -390 mL [09/23/20 1250]    Waiting for hospice home bed - TOC/hospice liaison will update when bed is available  Status is: Inpatient  Remains inpatient appropriate because:Altered mental status   Dispo: The patient is from: Home              Anticipated d/c is to: Home              Patient currently is not medically stable to d/c.   Difficult to place patient No       DVT prophylaxis:       SCDs Start: 09/20/20 1351     Family Communication: Discussed with patient's son [name] over phone at [phone] on 09/21/2020   All the records are reviewed and case discussed with Care Management/Social Worker. Management plans discussed with the  patient, family and they are in agreement.  CODE STATUS: DNR Level of care: Med-Surg  TOTAL TIME TAKING CARE OF THIS PATIENT: 35 minutes.   More than 50% of the time was spent in counseling/coordination of care: YES  POSSIBLE D/C IN 1-2 DAYS, DEPENDING ON CLINICAL CONDITION.   Max Sane  M.D on 09/23/2020 at 12:50 PM  [facility]   CC: Primary care physician; [name], MD  Note: This dictation was prepared with Dragon dictation along with smaller phrase technology. Any transcriptional errors that result from this process are unintentional.

## 2020-09-24 DIAGNOSIS — R531 Weakness: Secondary | ICD-10-CM

## 2020-09-24 DIAGNOSIS — C3492 Malignant neoplasm of unspecified part of left bronchus or lung: Secondary | ICD-10-CM

## 2020-09-24 MED ORDER — BIOTENE DRY MOUTH MT LIQD: 15.0000 mL | OROMUCOSAL | Status: DC | PRN

## 2020-09-24 MED ORDER — ACETAMINOPHEN 325 MG PO TABS: 650.0000 mg | ORAL_TABLET | Freq: Four times a day (QID) | ORAL | Status: DC | PRN

## 2020-09-24 MED ORDER — HYDROMORPHONE HCL 1 MG/ML IJ SOLN: 1.0000 mg | INTRAMUSCULAR | Status: DC | PRN

## 2020-09-24 MED ORDER — ONDANSETRON HCL 4 MG/2ML IJ SOLN: 4.0000 mg | Freq: Four times a day (QID) | INTRAMUSCULAR | Status: DC | PRN

## 2020-09-24 MED ORDER — ACETAMINOPHEN 650 MG RE SUPP: 650.0000 mg | Freq: Four times a day (QID) | RECTAL | Status: DC | PRN

## 2020-09-24 NOTE — Progress Notes (Addendum)
[facility]) Hospital Liaison RN note:  Visited with patient at bedside. Hospice Home is not able to offer a room today. Hospital care team is aware. Spoke with  Son, [name], to provide update. He will sign consents once a room becomes available.[facility] Liaison will continue to follow for room availability.   Please call with any hospice related questions or concerns.  Zandra Abts, RN  [facility] Liaison [phone]

## 2020-09-24 NOTE — Plan of Care (Signed)
Pt continues to have poor intake of po fluids and food. Alert but disoriented to time and situation. Denies pain. Will continue to monitor. Problem: Clinical Measurements: Goal: Ability to maintain clinical measurements within normal limits will improve Outcome: Progressing Goal: Will remain free from infection Outcome: Progressing Goal: Diagnostic test results will improve Outcome: Progressing Goal: Respiratory complications will improve Outcome: Progressing Goal: Cardiovascular complication will be avoided Outcome: Progressing   Problem: Nutrition: Goal: Adequate nutrition will be maintained Outcome: Progressing

## 2020-09-24 NOTE — Progress Notes (Signed)
1        [place] at [place] NAME: Jose [name]    MR#:  [id number]  DATE OF BIRTH:  07/17/1945  SUBJECTIVE:  CHIEF COMPLAINT:   Chief Complaint  Patient presents with  . Altered Mental Status  comfortable, still waiting for hospice home bed REVIEW OF SYSTEMS:  ROS unable to obtain due to his mental status DRUG ALLERGIES:   Allergies  Allergen Reactions  . Penicillin G Other (See Comments)    Other reaction(s): Unknown DID THE REACTION INVOLVE: Swelling of the face/tongue/throat, SOB, or low BP? Unknown Sudden or severe rash/hives, skin peeling, or the inside of the mouth or nose? Unknown Did it require medical treatment? Unknown When did it last happen?unknown If all above answers are "NO", may proceed with cephalosporin use.    VITALS:  Blood pressure (!) 151/84, pulse 82, temperature (!) 97.5 F (36.4 C), temperature source Oral, resp. rate 10, height 6\' 1"  (1.854 m), weight 58 kg, SpO2 100 %. PHYSICAL EXAMINATION:  Physical Exam  GENERAL:  75 y.o.-year-old cachectic patient lying in the bed.  Intermittent confusion EYES: Pupils equal, round, reactive to light and accommodation. No scleral icterus. Extraocular muscles intact.  HEENT: Head atraumatic, normocephalic. Oropharynx and nasopharynx clear.  NECK:  Supple, no jugular venous distention. No thyroid enlargement, no tenderness.  LUNGS: Normal breath sounds bilaterally, no wheezing, rales,rhonchi or crepitation. No use of accessory muscles of respiration.  CARDIOVASCULAR: S1, S2 normal. No murmurs, rubs, or gallops.  ABDOMEN: Soft, nontender, nondistended. Bowel sounds present. No organomegaly or mass.  EXTREMITIES: No pedal edema, cyanosis, or clubbing.  NEUROLOGIC: Nonfocal exam PSYCHIATRIC: The patient is awake but disoriented Dialysis access: Left arm AV fistula LABORATORY PANEL:  Male CBC Recent Labs  Lab 09/21/20 0520  WBC 7.1  HGB 13.4  HCT 39.6  PLT 104*    ------------------------------------------------------------------------------------------------------------------ Chemistries  Recent Labs  Lab 09/21/20 0520  NA 136  K 4.5  CL 98  CO2 24  GLUCOSE 67*  BUN 23  CREATININE 5.02*  CALCIUM 10.2  AST 25  ALT 9  ALKPHOS 27*  BILITOT 0.7   RADIOLOGY:  No results found. ASSESSMENT AND PLAN:  75 year old male with a known history of ESRD on hemodialysis, diabetes, hypertension, hyperlipidemia is admitted for acute metabolic encephalopathy likely due to underlying pneumonia.  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  Net IO Since Admission: -690 mL [09/24/20 1339]    Waiting for hospice home bed - TOC/hospice liaison will update when bed is available  Status is: Inpatient  Remains inpatient appropriate because:Altered mental status   Dispo: The patient is from: Home              Anticipated d/c is to: Hospice home              Patient currently is not medically stable to d/c.   Difficult to place patient No    DVT prophylaxis:       SCDs Start: 09/20/20 1351     Family Communication: Discussed with patient's son [name] over phone at [phone] on 09/21/2020   All the records are reviewed and case discussed with Care Management/Social Worker. Management plans discussed with the patient, family and  they are in agreement.  CODE STATUS: DNR Level of care: Med-Surg  TOTAL TIME TAKING CARE OF THIS PATIENT: 35 minutes.   More than 50% of the time was spent in counseling/coordination of care: YES  POSSIBLE D/C IN 1-2 DAYS, DEPENDING ON CLINICAL CONDITION.   Max Sane M.D on 09/24/2020 at 1:39 PM  [facility]   CC: Primary care physician; [name], MD  Note: This dictation was prepared with Dragon dictation along with smaller phrase technology. Any transcriptional errors that result from this process are unintentional.

## 2020-09-24 NOTE — Consult Note (Signed)
Consultation Note Date: 09/24/2020   Patient Name: Jose [name]  DOB: [birth year]  MRN: [id number]  Age / Sex: 75 y.o., male   PCP: Jose Pink, MD Referring Physician: Max Sane, MD   REASON FOR CONSULTATION:Establishing goals of care  Palliative Care consult requested for goals of care discussion in this 75 y.o. 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  Patient presented to the ER from dialysis due to worsening weakness and altered mental status.  During work-up chest x-ray showed multilobar pneumonia.  Initiated on IV antibiotics.  Nephrology following with plans for emergent dialysis due to recently missed session.  Clinical Assessment and Goals of Care: I have reviewed medical records including lab results, imaging, Epic notes, and MAR, received report from the bedside RN, and assessed the patient.   Patient is somnolent.  Will intermittently open eyes.  Unable to follow commands.  Some confusion noted based on what verbal responses he did provide.  I spoke with patient's son, Jose [name] to discuss diagnosis prognosis, Jose [name], EOL wishes, disposition and options.  I introduced Palliative Medicine as specialized medical care for people living with serious illness. It focuses on providing relief from the symptoms and stress of a serious illness. The goal is to improve quality of life for both the patient and the family.  We discussed a brief life review of the patient, along with his functional and nutritional status.  Patient lived in the home with his sister after the death of his wife over 2 years ago.  They were married for more than 30 years.  Jose [name] shares patient has 2 biological children however they have been estranged for more than 10 years with unknown locations.  He is actually  patient's stepson.  Patient enjoyed watching basketball and football.  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  Jose [name] shares prior to admission patient's health has continued to decline.  Patient has become increasingly weaker, sleeping more, requiring more assistance with ADLs, and poor appetite.  He states this has been going on for the past 2 days 4 weeks with a slow obvious decline for months.  We discussed His current illness and what it means in the larger context of His on-going co-morbidities. Natural disease trajectory and expectations at EOL were discussed.  Son verbalizes understanding of patient's current illness and comorbidities.  A detailed discussion was had today regarding advanced directives.  Concepts specific to code status, artifical feeding and hydration, continued IV antibiotics and rehospitalization.  Jose [name] confirms wishes for DNR/DNI and no life prolonging measures.  He is patient's documented Education officer, community.   The difference between a aggressive medical intervention and a palliative comfort care path were discussed at length.   Values and goals of care important to patient and family were attempted to be elicited.   Son is clear in his expressed wishes for patient care to focus on his comfort and eliminate any symptoms as best possible.  He is tearful expressing his experience losing his mother and does not want Jose [name] to suffer.  He wishes to discontinue all medical interventions including dialysis.  We discussed at length comfort care measures and what this will look like while hospitalized.  Son verbalizes understanding and again confirming wishes to focus solely on patient's comfort.   Hospice services outpatient were explained and offered.  Jose [name] verbalized  understanding and awareness of hospice's goals and philosophy of care.  We discussed at length home with hospice versus a residential hospice facility.  Son states he would like referral to  [facility] hospice home location.  Questions and concerns were addressed.The family was encouraged to call with questions or concerns.  PMT will continue to support holistically as needed.   CODE STATUS: DNR  ADVANCE DIRECTIVES: Primary Decision Maker: Jose [name] (stepson/HCPOA)   SYMPTOM MANAGEMENT: See below  Palliative Prophylaxis:   Aspiration, Eye Care, Frequent Pain Assessment, Oral Care and Turn Reposition  PSYCHO-SOCIAL/SPIRITUAL:  Support System: Family  Desire for further Chaplaincy support: No  Additional Recommendations (Limitations, Scope, Preferences):  Full Comfort Care  Education on hospice/palliative    PAST MEDICAL HISTORY: Past Medical History:  Diagnosis Date  . Chronic kidney disease   . Depression   . Diabetes mellitus without complication ([place])   . Glaucoma   . Glaucoma   . Gout   . Heart murmur   . HLD (hyperlipidemia)   . Hypertension   . Primary adenocarcinoma of left lung ([place])   . Prostate cancer ([place])   . Renal failure    dialysis t/t/s  . Renal insufficiency   . Stroke ([place])    tia x 2    ALLERGIES:  is allergic to penicillin g.   MEDICATIONS:  Current Facility-Administered Medications  Medication Dose Route Frequency Provider Last Rate Last Admin  . 0.9 %  sodium chloride infusion  250 mL Intravenous PRN Jose Sane, MD      . acetaminophen (TYLENOL) tablet 650 mg  650 mg Oral Q6H PRN Jose Sane, MD       Or  . acetaminophen (TYLENOL) suppository 650 mg  650 mg Rectal Q6H PRN Jose Sane, MD      . acetaminophen (TYLENOL) tablet 650 mg  650 mg Oral Q6H PRN Jose Sane, MD      . diphenhydrAMINE (BENADRYL) injection 25 mg  25 mg Intravenous Q4H PRN Jose Sane, MD      . glycopyrrolate (ROBINUL) tablet 1 mg  1 mg Oral Q4H PRN Jose Sane, MD       Or  . glycopyrrolate (ROBINUL) injection 0.2 mg  0.2 mg Subcutaneous Q4H PRN Jose Sane, MD       Or  . glycopyrrolate (ROBINUL) injection 0.2 mg  0.2 mg Intravenous Q4H PRN  Jose Sane, MD      . LORazepam (ATIVAN) injection 2-4 mg  2-4 mg Intravenous Q4H PRN Jose Sane, MD      . MEDLINE mouth rinse  15 mL Mouth Rinse BID Manuella Ghazi, Vipul, MD   15 mL at 09/24/20 0930  . morphine 2 MG/ML injection 2-4 mg  2-4 mg Intravenous Q30 min PRN Jose Sane, MD   2 mg at 09/21/20 1309  . ondansetron (ZOFRAN) tablet 4 mg  4 mg Oral Q6H PRN Jose Sane, MD      . oxyCODONE (Oxy IR/ROXICODONE) immediate release tablet 5 mg  5 mg Oral Q6H PRN Manuella Ghazi, Vipul, MD      . polyethylene glycol (MIRALAX / GLYCOLAX) packet 17 g  17 g Oral Daily PRN Jose Sane, MD      . polyvinyl alcohol (LIQUIFILM TEARS) 1.4 % ophthalmic solution 1 drop  1 drop Both Eyes QID PRN Manuella Ghazi, Vipul, MD      . sodium chloride flush (NS) 0.9 % injection 3 mL  3 mL Intravenous Q12H Jose Sane, MD   3 mL at 09/24/20 0931  .  sodium chloride flush (NS) 0.9 % injection 3 mL  3 mL Intravenous PRN Jose Sane, MD        VITAL SIGNS: BP (!) 149/79 (BP Location: Right Arm)   Pulse 64   Temp (!) 97.5 F (36.4 C) (Oral)   Resp 16   Ht 6\' 1"  (1.854 m)   Wt 58 kg   SpO2 100%   BMI 16.87 kg/m  Filed Weights   09/20/20 1144 09/20/20 1943  Weight: 59 kg 58 kg    Estimated body mass index is 16.87 kg/m as calculated from the following:   Height as of this encounter: 6\' 1"  (1.854 m).   Weight as of this encounter: 58 kg.  LABS: CBC:    Component Value Date/Time   WBC 7.1 09/21/2020 0520   HGB 13.4 09/21/2020 0520   HGB 13.6 08/18/2011 1054   HCT 39.6 09/21/2020 0520   PLT 104 (L) 09/21/2020 0520   Comprehensive Metabolic Panel:    Component Value Date/Time   NA 136 09/21/2020 0520   NA 141 08/18/2011 1054   K 4.5 09/21/2020 0520   K 4.5 08/18/2011 1054   BUN 23 09/21/2020 0520   BUN 25 (H) 08/18/2011 1054   CREATININE 5.02 (H) 09/21/2020 0520   CREATININE 2.10 (H) 08/18/2011 1054   ALBUMIN 2.5 (L) 09/21/2020 0520     Review of Systems  Unable to perform ROS: Acuity of condition   Unless otherwise  noted, a complete review of systems is negative.  Physical Exam General: NAD, thin, frail chronically-ill appearing Cardiovascular: regular rate and rhythm Pulmonary:  diminished bilaterally  Abdomen: soft, nontender, + bowel sounds Neurological: Lethargic, will intermittently open eyes, will not follow commands, confusion noted   Prognosis: < 2 weeks  Discharge Planning:  Hospice facility  Recommendations: . DNR/DNI-as confirmed by son/POA . Transition all care to focus on comfort and symptom management . Discussed at length with son patient's poor prognosis.  Son verbalized understanding and have requested all care to transition to comfort/end-of-life.  Wishes to discontinue all aggressive medical interventions including dialysis. Marland Kitchen Referral for outpatient hospice facility as requested by family.  Dr. Manuella Ghazi has placed orders. . Comfort orders as placed by attending with recommendations for: o Dilaudid PRN for pain/air hunger/comfort o Robinul PRN for excessive secretions o Ativan PRN for agitation/anxiety o Zofran PRN for nausea o Liquifilm tears PRN for dry eyes o May have comfort feeding o Comfort cart for family o Unrestricted visitations in the setting of EOL (per policy) o Oxygen PRN 2L or less for comfort. No escalation.  Marland Kitchen PMT will continue to support and follow as needed. Please call team line with urgent needs.   Palliative Performance Scale: PPS 10%              Son, Jose [name] expressed understanding and was in agreement with this plan.   Thank you for allowing the Palliative Medicine Team to assist in the care of this patient. Please utilize secure chat with additional questions, if there is no response within 30 minutes please call the above phone number.   Time In: 1735 Time Out: 1825 Time Total: 50 min.   Visit consisted of counseling and education dealing with the complex and emotionally intense issues of symptom management and palliative care in the setting of  serious and potentially life-threatening illness.Greater than 50%  of this time was spent counseling and coordinating care related to the above assessment and plan.  Signed by:  Alda Lea, AGPCNP-BC  Palliative Medicine Team  Phone: [phone] Pager: [phone] Amion: [place] Team providers are available by phone from 7am to 7pm daily and can be reached through the team cell phone.  Should this patient require assistance outside of these hours, please call the patient's attending physician.

## 2020-09-24 NOTE — Progress Notes (Signed)
   Daily Progress Note   Patient Name: Jose [name]       Date: 09/24/2020 DOB: 12-Jan-1946  Age: 75 y.o. MRN#: [id number] Attending Physician: Max Sane, MD Primary Care Physician: [name], MD Admit Date: 09/20/2020  Reason for Consultation/Follow-up: Non pain symptom management, Pain control, Psychosocial/spiritual support and Terminal Care  Subjective: Patient somnolent. Will arouse to verbal stimuli.  Closes eyes immediately.  Denies pain.  Appears comfortable no acute distress.  Tray at the bedside untouched.  Appetite remains poor.  No family at the bedside.  Patient continues under comfort care with pending transfer to the hospice home once bed becomes available.  Updates provided to son.  He verbalized understanding of plan plans to visit later today or tomorrow.  Education has been provided on end-of-life and comfort.  Son has contact information to call with any questions or needed support.  Length of Stay: 4 days  Vital Signs: BP (!) 149/79 (BP Location: Right Arm)   Pulse 64   Temp (!) 97.5 F (36.4 C) (Oral)   Resp 16   Ht 6\' 1"  (1.854 m)   Wt 58 kg   SpO2 100%   BMI 16.87 kg/m  SpO2: SpO2: 100 % O2 Device: O2 Device: Nasal Cannula O2 Flow Rate: O2 Flow Rate (L/min): 4 L/min  Physical Exam: NAD, cachectic, chronically ill-appearing RRR Diminished bilaterally Follow some simple commands            Palliative Care Assessment & Plan  HPI: Palliative Care consult requested for goals of care discussion in this 75 y.o. 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  Patient presented to the ER from dialysis due to worsening weakness and altered mental status.  During work-up chest x-ray showed multilobar pneumonia.  Initiated on IV antibiotics.  Nephrology following with plans for  emergent dialysis due to recently missed session.  Code Status:  DNR  Goals of Care/Recommendations:  Continue comfort care and symptom management.  Son has been educated and aware of current plan.  Understands patient will transfer to hospice home facility once a bed becomes available.  Dilaudid as needed for pain (in the setting of renal failure)  Vital signs daily  Manage all symptoms aggressively with as needed medications with a goal of comfort  PMT will continue to support and follow as needed  Prognosis: < 2 weeks  Discharge Planning: Hospice facility  Thank you for allowing the Palliative Medicine Team to assist in the care of this patient.  Time Total: 25 min.   Visit consisted of counseling and education dealing with the complex and emotionally intense issues of symptom management and palliative care in the setting of serious and potentially life-threatening illness.Greater than 50%  of this time was spent counseling and coordinating care related to the above assessment and plan.  Alda Lea, AGPCNP-BC  Palliative Medicine Team [phone]

## 2020-09-24 NOTE — Care Management Important Message (Signed)
Important Message  Patient Details  Name: Jose [name] MRN: [id number] Date of Birth: [birth year]   Medicare Important Message Given:  Other (see comment)  Patient is on comfort care and awaiting bed at [facility]. Out of respect for the patient and family no Important Message from [facility] given.  [name] 09/24/2020, 7:39 AM

## 2020-09-24 NOTE — Progress Notes (Signed)
Nutrition Brief Note  Chart reviewed. Pt now transitioning to comfort care.  No further nutrition interventions planned at this time.  Please re-consult as needed.   Jathen Sudano W, RD, LDN, CDCES Registered Dietitian II Certified Diabetes Care and Education Specialist Please refer to AMION for RD and/or RD on-call/weekend/after hours pager   

## 2020-09-24 NOTE — TOC Progression Note (Signed)
Transition of Care [facility]) - Progression Note    Patient Details  Name: FIELDING MAULT MRN: [id number] Date of Birth: 1945-12-26  Transition of Care [facility]) CM/SW Contact  Shelbie Hutching, RN Phone Number: 09/24/2020, 4:32 PM  Clinical Narrative:    No residential hospice bed today.   Expected Discharge Plan: [place] Barriers to Discharge: Hospice Bed not available  Expected Discharge Plan and Services Expected Discharge Plan: [place] In-house Referral: Hospice / Palliative Care Discharge Planning Services: CM Consult Post Acute Care Choice: Hospice Living arrangements for the past 2 months: (S) Single Family Home                 DME Arranged: N/A DME Agency: NA       HH Arranged: NA           Social Determinants of Health (SDOH) Interventions    Readmission Risk Interventions Readmission Risk Prevention Plan 09/02/2020 12/20/2018 10/15/2018  Transportation Screening Complete Complete Complete  PCP or Specialist Appt within 3-5 Days - - (No Data)  [place] or [place] - Complete -  Social Work Consult for [place] Planning/Counseling - - -  Palliative Care Screening - - -  Medication Review Press photographer) Complete Complete -  PCP or Specialist appointment within 3-5 days of discharge Complete - -  [place] or [place] Not Complete - -  [place] or Home Care Consult Pt Refusal Comments SNF recommended - -  Palliative Care Screening Not Applicable - -  Skilled Nursing Facility Complete - -  Some recent data might be hidden

## 2020-09-25 LAB — CULTURE, BLOOD (ROUTINE X 2)
Culture: NO GROWTH
Culture: NO GROWTH
Special Requests: ADEQUATE

## 2020-09-25 NOTE — Plan of Care (Signed)

## 2020-09-25 NOTE — Progress Notes (Signed)
[facility]) Hospital Liaison RN note:  Visited with patient at bedside. Unfortunately, Hospice Home is not able to offer a room today. Hospital care team is aware. Call to son, [name] to provide update. LVM. [place] Liaison will continue to follow for room availability.   Please call with any hospice related questions or concerns.  Thank you for the opportunity to participate in this patient's care.  Zandra Abts, RN [facility] Liaison  [phone]

## 2020-09-25 NOTE — Progress Notes (Signed)
1        [place] at [place] NAME: Jose [name]    MR#:  [id number]  DATE OF BIRTH:  [birth year]  SUBJECTIVE:  CHIEF COMPLAINT:   Chief Complaint  Patient presents with  . Altered Mental Status  comfortable, still waiting for hospice home bed, wants to change to full liquid diet as he's hungry but can't chew REVIEW OF SYSTEMS:  ROS unable to obtain due to his mental status DRUG ALLERGIES:   Allergies  Allergen Reactions  . Penicillin G Other (See Comments)    Other reaction(s): Unknown DID THE REACTION INVOLVE: Swelling of the face/tongue/throat, SOB, or low BP? Unknown Sudden or severe rash/hives, skin peeling, or the inside of the mouth or nose? Unknown Did it require medical treatment? Unknown When did it last happen?unknown If all above answers are "NO", may proceed with cephalosporin use.    VITALS:  Blood pressure 135/79, pulse 74, temperature 97.7 F (36.5 C), temperature source Oral, resp. rate 14, height 6\' 1"  (1.854 m), weight 58 kg, SpO2 100 %. PHYSICAL EXAMINATION:  Physical Exam  GENERAL:  75 y.o.-year-old cachectic patient lying in the bed. EYES: Pupils equal, round, reactive to light and accommodation. No scleral icterus. Extraocular muscles intact.  HEENT: Head atraumatic, normocephalic. Oropharynx and nasopharynx clear.  NECK:  Supple, no jugular venous distention. No thyroid enlargement, no tenderness.  LUNGS: Normal breath sounds bilaterally, no wheezing, rales,rhonchi or crepitation. No use of accessory muscles of respiration.  CARDIOVASCULAR: S1, S2 normal. No murmurs, rubs, or gallops.  ABDOMEN: Soft, nontender, nondistended. Bowel sounds present. No organomegaly or mass.  EXTREMITIES: No pedal edema, cyanosis, or clubbing.  NEUROLOGIC: Nonfocal exam PSYCHIATRIC: The patient is awake but disoriented LABORATORY PANEL:  Male CBC Recent Labs  Lab 09/21/20 0520  WBC 7.1  HGB 13.4  HCT 39.6  PLT 104*    ------------------------------------------------------------------------------------------------------------------ Chemistries  Recent Labs  Lab 09/21/20 0520  NA 136  K 4.5  CL 98  CO2 24  GLUCOSE 67*  BUN 23  CREATININE 5.02*  CALCIUM 10.2  AST 25  ALT 9  ALKPHOS 27*  BILITOT 0.7   RADIOLOGY:  No results found. ASSESSMENT AND PLAN:  75 year old male with a known history of ESRD on hemodialysis, diabetes, hypertension, hyperlipidemia is admitted for acute metabolic encephalopathy likely due to underlying pneumonia.  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  Net IO Since Admission: -510 mL [09/25/20 1610]    Still Waiting for hospice home bed - TOC/hospice liaison will update when bed is available  Status is: Inpatient  Remains inpatient appropriate because:Altered mental status   Dispo: The patient is from: Home              Anticipated d/c is to: Hospice home              Patient currently is not medically stable to d/c.   Difficult to place patient No     Family Communication: Discussed with patient's son Jose [name] over phone at [phone] on 09/24/2020   All the records are reviewed and case discussed with Care Management/Social Worker. Management plans discussed with the patient, family and they  are in agreement.  CODE STATUS: DNR Level of care: Med-Surg  TOTAL TIME TAKING CARE OF THIS PATIENT: 35 minutes.   More than 50% of the time was spent in counseling/coordination of care: YES  POSSIBLE D/C when hospice home bed is available. Hopefully in 1-2  DAYS   Jose [name] M.D on 09/25/2020 at 4:10 PM  [facility]   CC: Primary care physician; Jose Pink, MD  Note: This dictation was prepared with Dragon dictation along with smaller phrase technology. Any transcriptional errors that result from this process are unintentional.

## 2020-09-25 NOTE — Progress Notes (Signed)
     Patient resting in bed. Confused. Oral nutrition remains poor. No acute distress noted. Full comfort care measures.   No family at the bedside. Patient pending transfer to hospice home once bed becomes available.   Son updated and aware of plan/status. Education provided on current plan of care focusing on comfort. He is appreciative of the supportive care.   Assessment -NAD, cachectic, [place] ill-appearing - RRR - Diminished bilaterally - Somnolent, easily awaken.  Confused.  Follows simple commands.  Time Total: 20 min.   Visit consisted of counseling and education dealing with the complex and emotionally intense issues of symptom management and palliative care in the setting of serious and potentially life-threatening illness.Greater than 50%  of this time was spent counseling and coordinating care related to the above assessment and plan.  Alda Lea, AGPCNP-BC  Palliative Medicine Team [phone]

## 2020-09-26 DIAGNOSIS — E43 Unspecified severe protein-calorie malnutrition: Secondary | ICD-10-CM

## 2020-09-26 DIAGNOSIS — E039 Hypothyroidism, unspecified: Secondary | ICD-10-CM

## 2020-09-26 DIAGNOSIS — G9341 Metabolic encephalopathy: Secondary | ICD-10-CM | POA: Diagnosis present

## 2020-09-26 DIAGNOSIS — F419 Anxiety disorder, unspecified: Secondary | ICD-10-CM

## 2020-09-26 DIAGNOSIS — R338 Other retention of urine: Secondary | ICD-10-CM

## 2020-09-26 DIAGNOSIS — F32A Depression, unspecified: Secondary | ICD-10-CM

## 2020-09-26 MED ORDER — POLYVINYL ALCOHOL 1.4 % OP SOLN: 1.0000 [drp] | Freq: Four times a day (QID) | OPHTHALMIC | 0 refills | Status: AC | PRN

## 2020-09-26 MED ORDER — MORPHINE SULFATE (CONCENTRATE) 10 MG /0.5 ML PO SOLN: 5.0000 mg | ORAL | 0 refills | Status: AC | PRN

## 2020-09-26 MED ORDER — LORAZEPAM 0.5 MG PO TABS: 0.5000 mg | ORAL_TABLET | ORAL | 0 refills | Status: AC | PRN

## 2020-09-26 MED ORDER — COMBIVENT RESPIMAT 20-100 MCG/ACT IN AERS: 1.0000 | INHALATION_SPRAY | Freq: Four times a day (QID) | RESPIRATORY_TRACT | 0 refills | Status: AC | PRN

## 2020-09-26 NOTE — Progress Notes (Signed)
[facility]) Hospital Liaison RN note:  [facility] is able to offer a room today. Spoke with son, [name] over the phone. He will be signing registration paperwork at the [facility] at 3pm and [facility] has arranged transport for 5 pm. Hospital care team is aware. I will fax them the discharge summary once it is available.  Please call with any hospice related questions or concerns.  Thank you for the opportunity to participate in this patient's care.  Zandra Abts, RN [facility] Liaison [phone]

## 2020-09-26 NOTE — Progress Notes (Signed)
Report called to hospice nurse Santiago Glad RN, Pt transported by [facility] choice transportation to the facility

## 2020-09-26 NOTE — TOC Transition Note (Signed)
Transition of Care [facility]) - CM/SW Discharge Note   Patient Details  Name: Jose [name] MRN: [id number] Date of Birth: 03/20/1946  [facility]) CM/SW Contact:  Shelbie Hutching, RN Phone Number: 09/26/2020, 2:44 PM   Clinical Narrative:    Patient will discharge to the [facility] in [place] today.  [facility] will pick up patient at 5pm this evening.    Final next level of care: [place] Barriers to Discharge: Barriers Resolved   Patient Goals and CMS Choice Patient states their goals for this hospitalization and ongoing recovery are:: Family and patient have decided on comfort care and hospice CMS Medicare.gov Compare Post Acute Care list provided to:: Patient Represenative (must comment) Choice offered to / list presented to : Adult [place]  Discharge Placement              Patient chooses bed at: Other - please specify in the comment section below: ([place]) Patient to be transferred to facility by: [facility] Name of family member notified: [name] Patient and family notified of of transfer: 09/26/20  Discharge Plan and Services In-house Referral: Hospice / Palliative Care Discharge Planning Services: CM Consult Post Acute Care Choice: Hospice          DME Arranged: N/A DME Agency: NA       HH Arranged: NA          Social Determinants of Health (SDOH) Interventions     Readmission Risk Interventions Readmission Risk Prevention Plan 09/02/2020 12/20/2018 10/15/2018  Transportation Screening Complete Complete Complete  PCP or Specialist Appt within 3-5 Days - - (No Data)  [place] or [place] - Complete -  Social Work Consult for [place] Planning/Counseling - - -  [place] - - -  Medication Review Press photographer) Complete Complete -  PCP or Specialist appointment within 3-5 days of discharge Complete - -  [place] or [place] Not Complete - -  [place] or Home Care Consult Pt Refusal Comments SNF recommended - -  Palliative Care Screening Not Applicable - -  Skilled Nursing Facility Complete - -  Some recent data might be hidden

## 2020-09-26 NOTE — Discharge Summary (Signed)
Physician Discharge Summary  Jose [name] MLY:[id number] DOB: November 18, 1945 DOA: 09/20/2020  PCP: [name], MD  Admit date: 09/20/2020 Discharge date: 09/26/2020  Time spent: 55 minutes  Recommendations for Outpatient Follow-up:  1. Patient with discharge to residential hospice home. 2. Follow-up with MD at residential hospice home.   Discharge Diagnoses:  Principal Problem:   Acute metabolic encephalopathy Active Problems:   Malnutrition of moderate degree   ESRD on dialysis ([place])   Anxiety and depression   Acute urinary retention   Type 2 diabetes mellitus with hyperlipidemia (HCC)   Primary malignant neoplasm of lung metastatic to other site [facility])   Malignant pleural effusion   Malignant neoplasm of upper lobe of left lung (HCC)   Hypothyroidism   Protein-calorie malnutrition, severe   HCAP (healthcare-associated pneumonia)   Altered mental status   Discharge Condition: Stable  Diet recommendation: Full liquid diet  Filed Weights   09/20/20 1144 09/20/20 1943  Weight: 59 kg 58 kg    History of present illness:  HPI per Dr. Rolene Course Kapler  is a 75 y.o. male with a known history of ESRD on hemodialysis, diabetes, hypertension, hyperlipidemia sent to the emergency department from his dialysis place for worsening weakness.  While at dialysis he was reportedly very confused and altered with a similar history in the past when he has any infection.  He did not receive his dialysis on the day of admission.  While in the ED he was noted to have hyperkalemia and multilobar pneumonia seen on chest x-ray.  Patient still seem to be somewhat altered and talking out of the head.  Hospital Course:  1 acute metabolic encephalopathy/multi lobar pneumonia -Patient was admitted with confusion and worsening weakness from hemodialysis center and felt likely on admission secondary to pneumonia and uremia due to missed hemodialysis.  Head CT which was done was negative for any  acute abnormalities. -Chest x-ray done was concerning for probable multilobar pneumonia, small to moderate sized left effusion in the setting of underlying chronic interstitial disease. -Patient initially started empirically on IV Levaquin. -For emergent hemodialysis due to fluid overload from missed dialysis and hyperkalemia. -Patient underwent emergent hemodialysis on day of admission. -Palliative care consulted and spoke with family and decision was made to withdraw care and transition to full comfort measures. -Hemodialysis subsequently discontinued -Patient transitioned to full comfort measures. -Patient will be discharged to residential hospice home.  2.  End-stage renal disease/hyperkalemia -Patient presented with altered mental status, hyperkalemia, concerns for uremia due to missed hemodialysis. -Patient seen by nephrology patient underwent emergent hemodialysis on day of admission -Patient noted with a poor prognosis, history of primary adenocarcinoma of the left lung with mets to the pleural space/stage IV lung cancer. -In by oncology and palliative care recommended. -Family met with palliative care and decision was made to transition to full comfort measures. -Patient was discharged to residential hospice home.  3.  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 -Patient noted to have had a rapid decline. -It was thought per oncology patient was not a candidate for further chemotherapy treatment and hospice recommended. -Palliative care consulted met with family and decision was made to transition to full comfort measures. -Patient will be discharged to residential hospice home.  4.  Hypothyroidism -Initially due to elevated TSH patient Synthroid dose was decreased. -Palliative care met with patient and family and decision was made to transition to full  comfort measures. -Synthroid  subsequently discontinued. -Patient be discharged to residential hospice home.  5.  BPH -Patient maintained on home regimen of Flomax.  6.  Severe protein calorie malnutrition -Palliative care met with family decision was made to transition to full comfort measures.  7.  Depression/anxiety -Patient initially maintained on home regimen Lexapro.   The rest of patient's chronic medical issues remained stable during the hospitalization. -Palliative care met with patient and family and decision was made to transition to full comfort measures. -Patient be discharged to residential hospice home    Procedures:  CT head without contrast 09/20/2020  Chest x-ray 09/20/2020    Consultations:  Oncology: Dr.Yu 09/21/2020  Palliative care: Chesley Noon Pickenpack-Cousar 09/21/2020  Nephrology: Dr.Kolluru 09/21/2020  Discharge Exam: Vitals:   09/26/20 0447 09/26/20 1139  BP: 134/79 (!) 155/98  Pulse: 69 63  Resp: 18 20  Temp: (!) 97.5 F (36.4 C)   SpO2: 100% 100%    General: Resting comfortably. Cardiovascular: RRR Respiratory: CTAB anterior lung fields.  Discharge Instructions   Discharge Instructions    Diet full liquid   Complete by: As directed    Increase activity slowly   Complete by: As directed      Allergies as of 09/26/2020      Reactions   Penicillin G Other (See Comments)   Other reaction(s): Unknown DID THE REACTION INVOLVE: Swelling of the face/tongue/throat, SOB, or low BP? Unknown Sudden or severe rash/hives, skin peeling, or the inside of the mouth or nose? Unknown Did it require medical treatment? Unknown When did it last happen?unknown If all above answers are "NO", may proceed with cephalosporin use.      Medication List    STOP taking these medications   clopidogrel 75 MG tablet Commonly known as: PLAVIX   feeding supplement (NEPRO CARB STEADY) Liqd   guaiFENesin 600 MG 12 hr tablet Commonly known as: MUCINEX   levothyroxine 50 MCG  tablet Commonly known as: Synthroid   losartan 50 MG tablet Commonly known as: COZAAR   metoprolol succinate 25 MG 24 hr tablet Commonly known as: TOPROL-XL   multivitamin Tabs tablet   oxyCODONE 5 MG immediate release tablet Commonly known as: Oxy IR/ROXICODONE   torsemide 100 MG tablet Commonly known as: DEMADEX     TAKE these medications   acetaminophen 325 MG tablet Commonly known as: TYLENOL Take 2 tablets (650 mg total) by mouth every 6 (six) hours as needed for mild pain, moderate pain, fever or headache.   aspirin 81 MG EC tablet Take 1 tablet (81 mg total) by mouth daily.   Combivent Respimat 20-100 MCG/ACT Aers respimat Generic drug: Ipratropium-Albuterol Inhale 1 puff into the lungs every 6 (six) hours as needed for wheezing. What changed:   when to take this  reasons to take this   escitalopram 10 MG tablet Commonly known as: Lexapro Take 1 tablet (10 mg total) by mouth daily.   gabapentin 100 MG capsule Commonly known as: NEURONTIN Take 1 capsule (100 mg total) by mouth 3 (three) times daily.   LORazepam 0.5 MG tablet Commonly known as: Ativan Take 1 tablet (0.5 mg total) by mouth every 4 (four) hours as needed for anxiety.   morphine CONCENTRATE 10 mg / 0.5 ml concentrated solution Take 0.25 mLs (5 mg total) by mouth every 4 (four) hours as needed for severe pain.   nicotine 21 mg/24hr patch Commonly known as: NICODERM CQ - dosed in mg/24 hours Place 1 patch (21 mg total) onto the  skin daily.   ondansetron 4 MG tablet Commonly known as: ZOFRAN Take 1 tablet (4 mg total) by mouth every 6 (six) hours as needed for nausea.   polyethylene glycol 17 g packet Commonly known as: MIRALAX / GLYCOLAX Take 17 g by mouth daily as needed for mild constipation.   polyvinyl alcohol 1.4 % ophthalmic solution Commonly known as: LIQUIFILM TEARS Place 1 drop into both eyes 4 (four) times daily as needed for dry eyes.   tamsulosin 0.4 MG Caps  capsule Commonly known as: FLOMAX Take 1 capsule (0.4 mg total) by mouth daily.      Allergies  Allergen Reactions  . Penicillin G Other (See Comments)    Other reaction(s): Unknown DID THE REACTION INVOLVE: Swelling of the face/tongue/throat, SOB, or low BP? Unknown Sudden or severe rash/hives, skin peeling, or the inside of the mouth or nose? Unknown Did it require medical treatment? Unknown When did it last happen?unknown If all above answers are "NO", may proceed with cephalosporin use.     Follow-up Information    MD AT SNF Follow up.                The results of significant diagnostics from this hospitalization (including imaging, microbiology, ancillary and laboratory) are listed below for reference.    Significant Diagnostic Studies: DG Chest 2 View  Result Date: 09/01/2020 CLINICAL DATA:  Cough with weakness 1 week. EXAM: CHEST - 2 VIEW COMPARISON:  08/30/2020 and 06/05/2020 as well as CT chest 08/30/2020 FINDINGS: Lungs are adequately inflated with slight worsening hazy density over the left base/retrocardiac region with blunting of the left costophrenic angle as findings may be due to effusion/atelectasis versus infection. Subtle hazy prominence of the perihilar vessels unchanged. Stable cardiomegaly. Remainder of the exam is unchanged. IMPRESSION: 1. Slight worsening hazy density over the left base/retrocardiac region which may be due to effusion/atelectasis versus infection. 2. Stable cardiomegaly with suggestion of minimal vascular congestion. Electronically Signed   By: Marin Olp M.D.   On: 09/01/2020 12:31   DG Chest 2 View  Result Date: 08/30/2020 CLINICAL DATA:  Cough and weakness EXAM: CHEST - 2 VIEW COMPARISON:  June 05, 2020 FINDINGS: There is airspace opacity in portions of each mid lung and left lower lobe with left pleural effusion. There is cardiomegaly with pulmonary vascularity within normal limits. No adenopathy. No bone lesions. IMPRESSION:  Airspace opacity consistent with pneumonia in the left lower lung region as well as in each mid lung region. Check of COVID-19 status may be prudent in this circumstance. Left pleural effusion noted.  Stable cardiomegaly. Electronically Signed   By: Lowella Grip III M.D.   On: 08/30/2020 18:02   CT Head Wo Contrast  Result Date: 09/20/2020 CLINICAL DATA:  Dialysis patient with mental status changes. Lethargy. EXAM: CT HEAD WITHOUT CONTRAST TECHNIQUE: Contiguous axial images were obtained from the base of the skull through the vertex without intravenous contrast. COMPARISON:  03/08/2007 FINDINGS: Brain: Generalized atrophy. Chronic small-vessel ischemic changes of the pons. 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 Advanced chronic small vessel disease of the hemispheric white matter. No evidence of recent infarction, mass lesion, hemorrhage, hydrocephalus or extra-axial collection Vascular: There is atherosclerotic calcification of the major vessels at the base of the brain. Skull: Negative Sinuses/Orbits: Clear/normal Other: None IMPRESSION: No acute finding by CT. Advanced chronic small-vessel ischemic changes throughout the brain as outlined above. Electronically Signed  By: Nelson Chimes M.D.   On: 09/20/2020 12:44   CT Chest Wo Contrast  Result Date: 08/30/2020 CLINICAL DATA:  Pneumonia, effusion or abscess suspected, xray done Generalized weakness for 2 weeks. Radiologic records demonstrates history of lung cancer. EXAM: CT CHEST WITHOUT CONTRAST TECHNIQUE: Multidetector CT imaging of the chest was performed following the standard protocol without IV contrast. COMPARISON:  Radiograph earlier today. Staging chest CT 8 days ago 08/21/2020 FINDINGS: Cardiovascular: Aortic atherosclerosis and tortuosity. Focal outpouching of the left lateral wall of thoracic aorta on prior exam is grossly stable, but not well assessed  on the current exam in the absence of IV contrast. No periaortic stranding. Multi chamber cardiomegaly. Coronary artery calcifications. Small pericardial effusion is similar Mediastinum/Nodes: Patient had recent staging CT with IV contrast the. Right paratracheal lymph node measuring right lower paratracheal node measuring 2.3 cm, series 2, image 50, unchanged allowing for differences in technique and caliper placement. Subcarinal node measures 2.1 cm, series 2, image 71, previously 1.9 cm. Additional smaller lymph nodes are unchanged from prior exam. Hilar adenopathy is not well assessed on this noncontrast exam. Left epicardial node measures 1.2 cm, series 2, image 139, previously 1.1 cm. No obvious no adenopathy in the interim. Decompressed esophagus. Lungs/Pleura: Partially loculated left pleural effusion at the lung base this is not significantly changed in size. There is left lower lobe bronchial thickening. No definite acute airspace disease. There is mild scarring abutting the inter lobar fissure on the left which appears similar to prior exam. Spiculated left upper lobe pulmonary nodule measures 2.5 x 2 cm, series 3, image 27, unchanged. Spiculations extend to the pleural surface anterior laterally. Background emphysema. Patchy ground-glass opacity in the perifissural right upper lobe, series 3, image 66, with adjacent fissural thickening, progressed/new. Additional a ground-glass opacity in the perihilar right lower lobe. Slight basilar septal thickening. There is a trace right pleural effusion and seen on prior exam. Upper Abdomen: Bilateral renal parenchymal atrophy. Diverticulosis of the included colon. No acute upper abdominal findings. Musculoskeletal: There are no acute or suspicious osseous abnormalities. IMPRESSION: 1. Patchy ground-glass opacity in the perifissural right upper and to a lesser extent right lower lobe. Differential considerations include pneumonitis including COVID-19 versus  pulmonary edema. Small right pleural effusion is new from CT 1 week ago. 2. Unchanged partially loculated left pleural effusion at the lung base. 3. Unchanged spiculated left upper lobe pulmonary nodule. Unchanged mediastinal adenopathy. Patient recently underwent staging CT. 4. Cardiomegaly with coronary artery calcifications. Small pericardial effusion is unchanged. Aortic Atherosclerosis (ICD10-I70.0) and Emphysema (ICD10-J43.9). Electronically Signed   By: Keith Rake M.D.   On: 08/30/2020 20:01   DG Chest Portable 1 View  Result Date: 09/20/2020 CLINICAL DATA:  Altered mental status. Dialysis patient. EXAM: PORTABLE CHEST 1 VIEW COMPARISON:  09/01/2020 FINDINGS: Stable enlarged cardiac silhouette. Small to moderate-sized left pleural effusion with an interval increase. Interval patchy opacity throughout the majority of the left lung. Stable prominence of the interstitial markings in the right lung. No pleural fluid on the right. Stable mild to moderate scoliosis. IMPRESSION: 1. Interval left lung probable multilobar pneumonia. 2. Small to moderate-sized left pleural effusion, increased. 3. Stable cardiomegaly and chronic interstitial lung disease. Electronically Signed   By: Claudie Revering M.D.   On: 09/20/2020 12:48    Microbiology: Recent Results (from the past 240 hour(s))  Blood culture (routine x 2)     Status: None   Collection Time: 09/20/20  1:52 PM   Specimen: BLOOD  Result Value Ref Range Status   Specimen Description BLOOD RIGHT ANTECUBITAL  Final   Special Requests   Final    BOTTLES DRAWN AEROBIC AND ANAEROBIC Blood Culture results may not be optimal due to an excessive volume of blood received in culture bottles   Culture   Final    NO GROWTH 5 DAYS Performed at [facility], [address], [place], [place] [address]    Report Status 09/25/2020 FINAL  Final  Blood culture (routine x 2)     Status: None   Collection Time: 09/20/20  1:52 PM   Specimen: BLOOD   Result Value Ref Range Status   Specimen Description BLOOD BLOOD RIGHT ARM  Final   Special Requests   Final    BOTTLES DRAWN AEROBIC AND ANAEROBIC Blood Culture adequate volume   Culture   Final    NO GROWTH 5 DAYS Performed at [facility], [address] [place], [place] [address]    Report Status 09/25/2020 FINAL  Final  Resp Panel by RT-PCR (Flu A&B, Covid) Nasopharyngeal Swab     Status: None   Collection Time: 09/20/20  1:54 PM   Specimen: Nasopharyngeal Swab; Nasopharyngeal(NP) swabs in vial transport medium  Result Value Ref Range Status   SARS Coronavirus 2 by RT PCR NEGATIVE NEGATIVE Final    Comment: (NOTE) SARS-CoV-2 target nucleic acids are NOT DETECTED.  The SARS-CoV-2 RNA is generally detectable in upper respiratory specimens during the acute phase of infection. The lowest concentration of SARS-CoV-2 viral copies this assay can detect is 138 copies/mL. A negative result does not preclude SARS-Cov-2 infection and should not be used as the sole basis for treatment or other patient management decisions. A negative result may occur with  improper specimen collection/handling, submission of specimen other than nasopharyngeal swab, presence of viral mutation(s) within the areas targeted by this assay, and inadequate number of viral copies(<138 copies/mL). A negative result must be combined with clinical observations, patient history, and epidemiological information. The expected result is Negative.  Fact Sheet for Patients:  EntrepreneurPulse.com.au  Fact Sheet for Healthcare Providers:  IncredibleEmployment.be  This test is no t yet approved or cleared by the [place] FDA and  has been authorized for detection and/or diagnosis of SARS-CoV-2 by FDA under an Emergency Use Authorization (EUA). This EUA will remain  in effect (meaning this test can be used) for the duration of the COVID-19 declaration under Section  564(b)(1) of the Act, 21 U.S.C.section 360bbb-3(b)(1), unless the authorization is terminated  or revoked sooner.       Influenza A by PCR NEGATIVE NEGATIVE Final   Influenza B by PCR NEGATIVE NEGATIVE Final    Comment: (NOTE) The Xpert Xpress SARS-CoV-2/FLU/RSV plus assay is intended as an aid in the diagnosis of influenza from Nasopharyngeal swab specimens and should not be used as a sole basis for treatment. Nasal washings and aspirates are unacceptable for Xpert Xpress SARS-CoV-2/FLU/RSV testing.  Fact Sheet for Patients: EntrepreneurPulse.com.au  Fact Sheet for Healthcare Providers: IncredibleEmployment.be  This test is not yet approved or cleared by the [place] FDA and has been authorized for detection and/or diagnosis of SARS-CoV-2 by FDA under an Emergency Use Authorization (EUA). This EUA will remain in effect (meaning this test can be used) for the duration of the COVID-19 declaration under Section 564(b)(1) of the Act, 21 U.S.C. section 360bbb-3(b)(1), unless the authorization is terminated or revoked.  Performed at [facility], [address], [place], [place] [address]  Labs: Basic Metabolic Panel: Recent Labs  Lab 09/20/20 1156 09/21/20 0520  NA 133* 136  K 5.2* 4.5  CL 95* 98  CO2 27 24  GLUCOSE 103* 67*  BUN 29* 23  CREATININE 5.73* 5.02*  CALCIUM 11.1* 10.2   Liver Function Tests: Recent Labs  Lab 09/20/20 1156 09/21/20 0520  AST 27 25  ALT 12 9  ALKPHOS 29* 27*  BILITOT 1.1 0.7  PROT 6.5 5.8*  ALBUMIN 3.0* 2.5*   No results for input(s): LIPASE, AMYLASE in the last 168 hours. No results for input(s): AMMONIA in the last 168 hours. CBC: Recent Labs  Lab 09/20/20 1156 09/20/20 1938 09/21/20 0520  WBC 6.5 7.6 7.1  NEUTROABS 4.6  --   --   HGB 13.0 13.8 13.4  HCT 39.7 43.1 39.6  MCV 98.5 100.0 96.4  PLT 127* 125* 104*   Cardiac Enzymes: No results for input(s): CKTOTAL,  CKMB, CKMBINDEX, TROPONINI in the last 168 hours. BNP: BNP (last 3 results) Recent Labs    10/29/19 2021 08/30/20 1749 09/01/20 1049  BNP 122.5* >4,500.0* >4,500.0*    ProBNP (last 3 results) No results for input(s): PROBNP in the last 8760 hours.  CBG: Recent Labs  Lab 09/20/20 1859 09/21/20 0835 09/21/20 1157  GLUCAP 85 85 72       Signed:  Irine Seal MD.  [facility] 09/26/2020, 3:15 PM

## 2020-09-30 DEATH — deceased

## 2020-10-02 ENCOUNTER — Inpatient Hospital Stay: Payer: Medicare Other

## 2022-01-16 IMAGING — CT NM PET TUM IMG INITIAL (PI) SKULL BASE T - THIGH
1 of 9 series · 1 of 25 positions shown · non-contrast
Comparison: 10/30/2019 chest abdomen and pelvic CTs.

CLINICAL DATA: Initial treatment strategy for staging of left upper
lobe lung cancer.

EXAM:
NUCLEAR MEDICINE PET SKULL BASE TO THIGH
TECHNIQUE: 7.8 mCi F-18 FDG was injected intravenously. Full-ring PET imaging
was performed from the skull base to thigh after the radiotracer. CT
data was obtained and used for attenuation correction and anatomic
localization.
Fasting blood glucose: 81 mg/dl

[Series 3: ct wb 5.0 b30f · axial · 5.0mm · 0.98mm/px · 1 of 290 slices shown]
[im 290/290  brain]
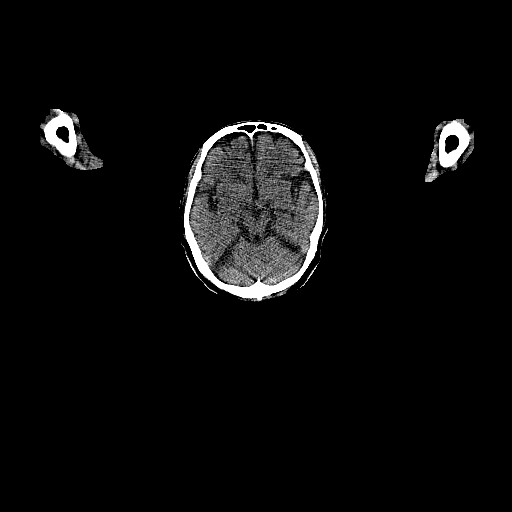

[1 of 25 positions shown; findings below may reference images not displayed]

FINDINGS: Mediastinal blood pool activity: SUV max

Liver activity: SUV max Not applicable.

NECK: A low left jugular/supraclavicular node measures 7 mm and a
S.U.V. max of 3.9 on 59/3. 9 mm on the prior.

Incidental CT findings: Bilateral carotid atherosclerosis.

CHEST: Left apical lung nodule measures 1.8 cm and a S.U.V. max of
5.8 on 67/3. 1.6 cm on 10/30/2019.

Thoracic nodal metastasis. Index right paratracheal node measures
1.1 cm and a S.U.V. max of 5.3 on 84/3. Compare 8 mm on the prior
CT.

Prevascular node measures 1.0 cm and a S.U.V. max of 4.8 versus
cm on the prior diagnostic CT.

Hypermetabolism within the left pleural space with decreased small
left pleural effusion. This hypermetabolism is diffuse, on the order
of a S.U.V. max of

Incidental CT findings: Centrilobular emphysema. Cardiomegaly.
Aortic and coronary artery atherosclerosis. Focal outpouching of the
descending aorta, similar at 2.2 cm on 94/3.

ABDOMEN/PELVIS: Right adrenal hypermetabolism and mild thickening at
a S.U.V. max of 3.3. The left adrenal is mildly thickened and
nodular, measuring a S.U.V. max of 4.0.

Hypermetabolism within the low rectum/anus and adjacent right-side
of the ischioanal fossa where there apparent outpouching of rectal
gas. Example at a S.U.V. max of 9.9 on 241/3. The CT appearance is
similar to on the prior.

Incidental CT findings: Infrarenal aortic dilatation at 2.4 cm.
Prior common iliac stent repairs. Bilateral renal atrophy. Upper
pole left renal low-density lesion is likely a cyst. Radiation seeds
in the prostate.

SKELETON: No abnormal marrow activity.

Incidental CT findings: Subtle bilateral femoral head avascular
necrosis.
IMPRESSION: 1. Left upper lobe primary bronchogenic carcinoma with thoracic and
low jugular nodal metastasis. The extent of disease is relatively
similar to 10/30/[address], with increase in left upper lobe
lung nodule, enlargement of some nodes and decreased size of others.
2. Bilateral adrenal hypermetabolism, with adrenal thickening but no
well-defined dominant mass. Recommend attention on follow-up.
3. Hypermetabolism corresponding to irregularity of the anterior
right-side of the rectum in the setting of prior radiation seeds in
the prostate. Consider physical exam correlation to exclude
proctitis and/or chronic perianal/perirectal collection.
4. Decrease in left pleural effusion with left pleural catheter in
place. Nonspecific left pleural hypermetabolism.
5. Descending thoracic aortic saccular aneurysm, similar.

## 2022-06-04 IMAGING — CR DG CHEST 2V
1 series · 2 of 2 positions shown · non-contrast
Comparison: CT chest 04/30/2020. chest radiographs 11/11/2019.

CLINICAL DATA: Fever, weakness. Additional history provided:
Patient presents from dialysis with chills and cold symptoms, clear
nasal drainage, fever.

EXAM:
CHEST - 2 VIEW

[Series 1: dg chest 2 view · 0.14mm/px · 2 of 2 slices shown]
[im 1/2]
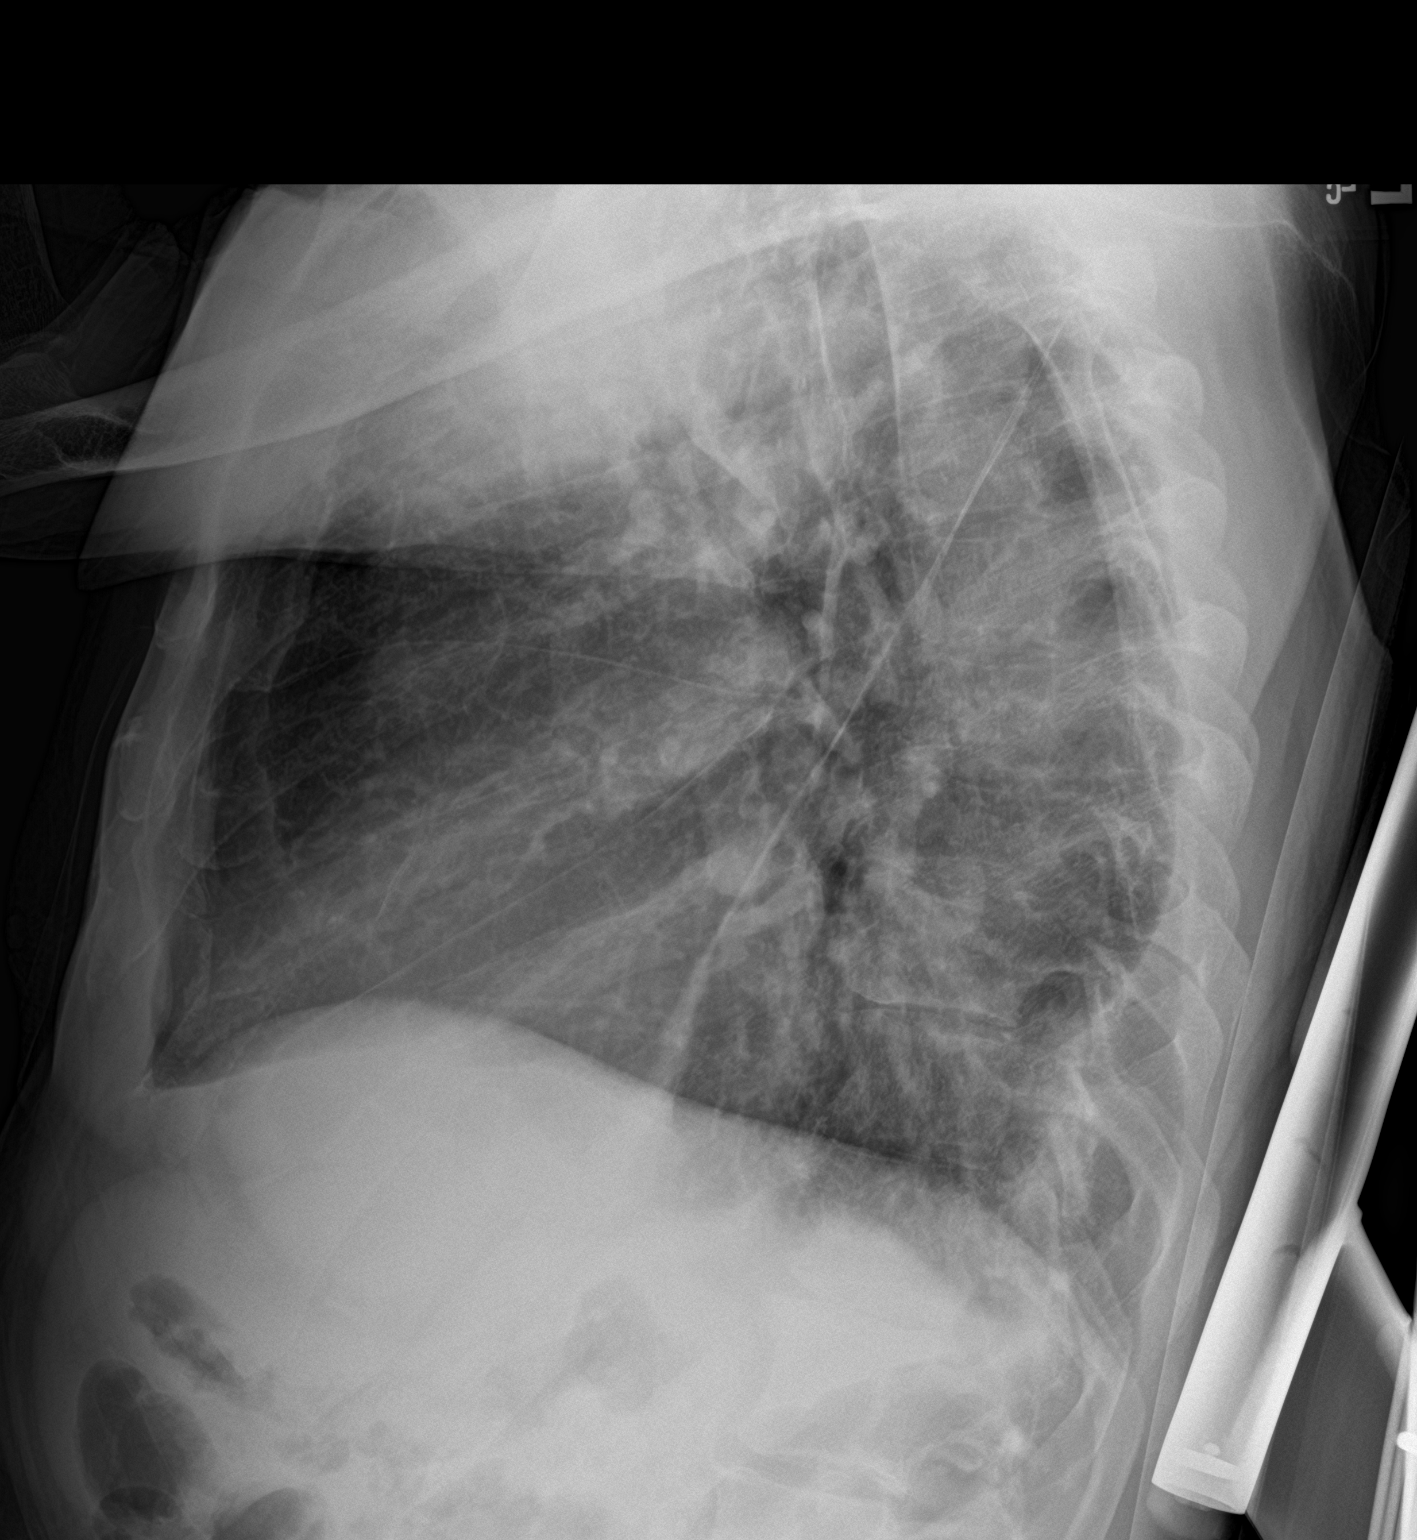
[im 2/2]
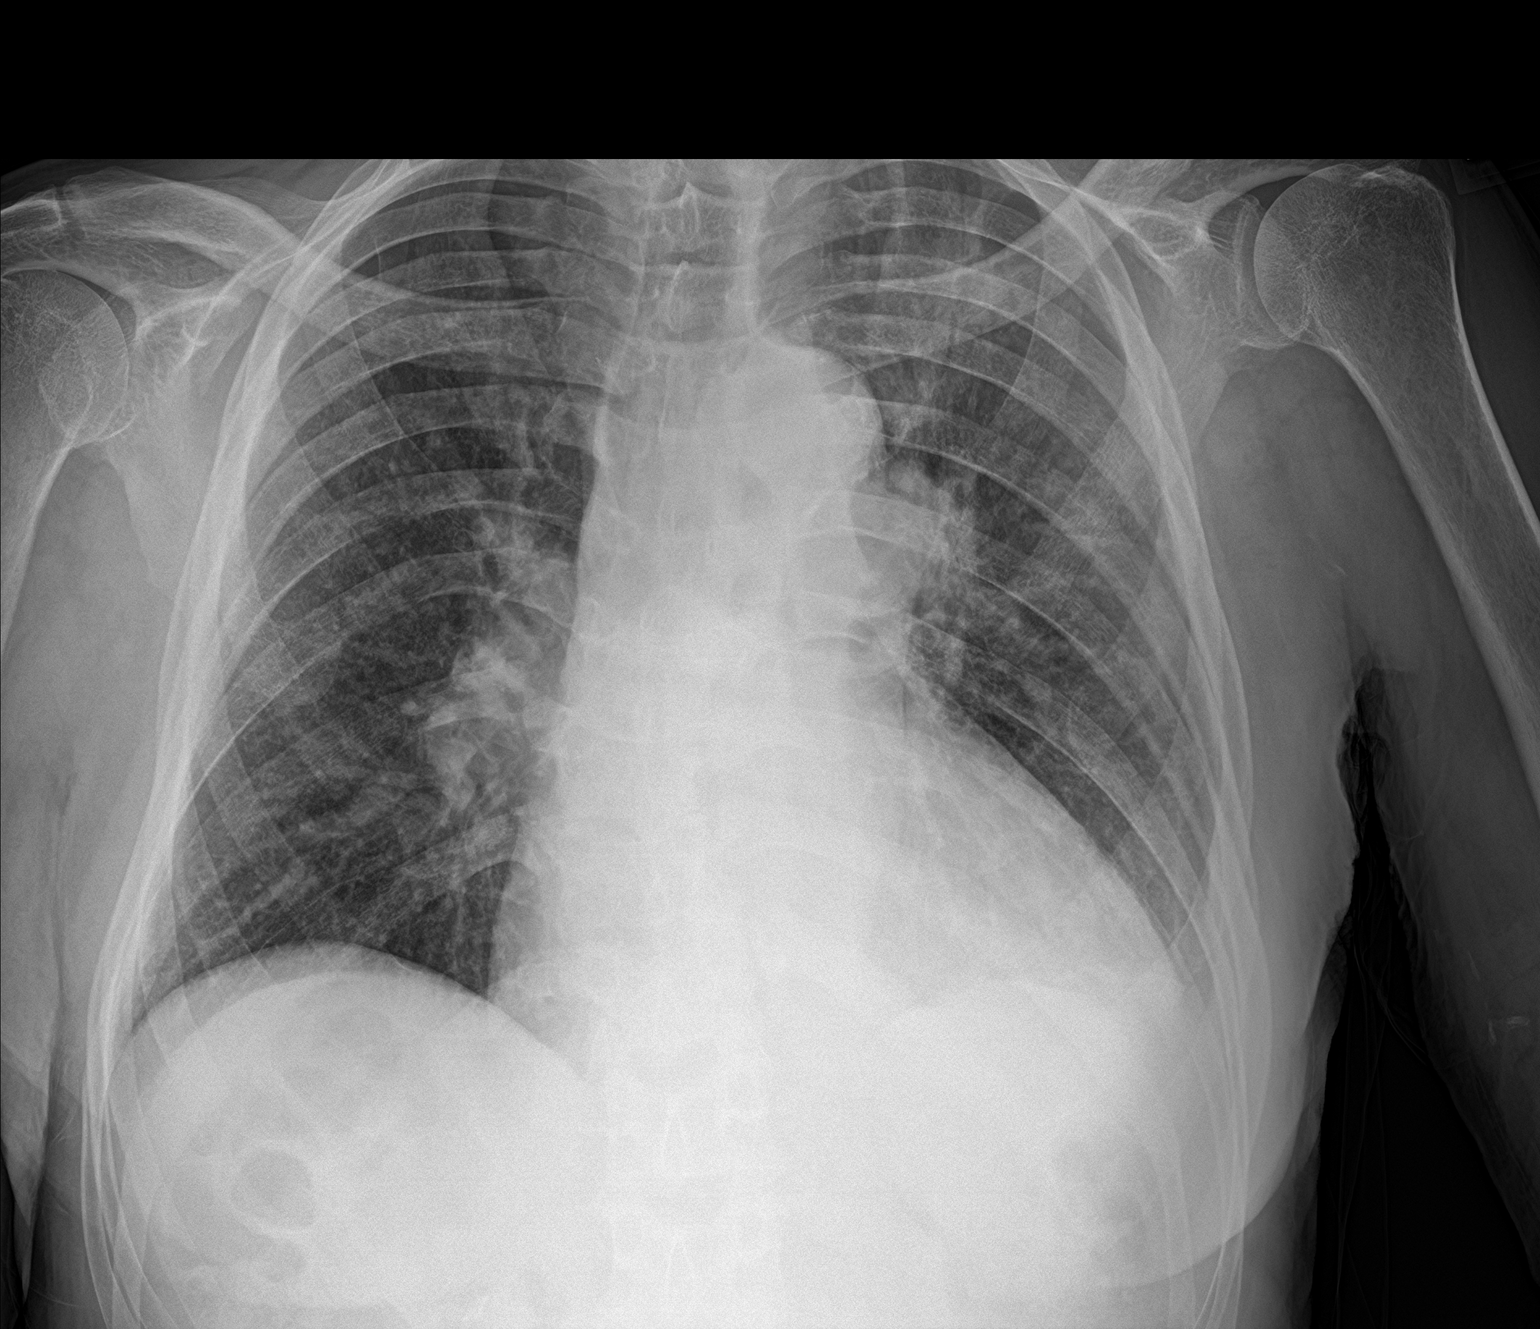

[2 of 2 positions shown; findings below may reference images not displayed]

FINDINGS: Mild cardiomegaly. Aortic atherosclerosis. A known left upper lobe
nodule was better appreciated on the prior chest CT of 04/30/2020 in
this patient with a known history of primary bronchogenic carcinoma.
Central pulmonary vascular congestion. Additional mild interstitial
prominence throughout both lungs. Trace left pleural effusion. No
acute bony abnormality is identified.
IMPRESSION: Mild cardiomegaly with central pulmonary vascular congestion.

Additional mild interstitial prominence throughout both lungs may
reflect edema. However, atypical/viral pneumonia cannot be excluded
and clinical correlation is recommended.

A known left upper lobe nodule (in this patient with known
bronchogenic carcinoma) was better appreciated on the chest CT of
04/30/2020.

Trace left pleural effusion.
